# Patient Record
Sex: Male | Born: 1955 | Race: White | Hispanic: No | Smoking: Former smoker
Health system: Southern US, Community
[De-identification: ages and names within clinical notes are randomized; demographics above are authoritative.]

## PROBLEM LIST (undated history)

## (undated) ENCOUNTER — Emergency Department (HOSPITAL_COMMUNITY): Payer: PRIVATE HEALTH INSURANCE | Source: Home / Self Care

## (undated) DIAGNOSIS — Z9289 Personal history of other medical treatment: Secondary | ICD-10-CM

## (undated) DIAGNOSIS — E669 Obesity, unspecified: Secondary | ICD-10-CM

## (undated) DIAGNOSIS — R569 Unspecified convulsions: Secondary | ICD-10-CM

## (undated) DIAGNOSIS — G4700 Insomnia, unspecified: Secondary | ICD-10-CM

## (undated) DIAGNOSIS — G8220 Paraplegia, unspecified: Secondary | ICD-10-CM

## (undated) DIAGNOSIS — Z978 Presence of other specified devices: Secondary | ICD-10-CM

## (undated) DIAGNOSIS — E785 Hyperlipidemia, unspecified: Secondary | ICD-10-CM

## (undated) DIAGNOSIS — K5900 Constipation, unspecified: Secondary | ICD-10-CM

## (undated) DIAGNOSIS — E538 Deficiency of other specified B group vitamins: Secondary | ICD-10-CM

## (undated) DIAGNOSIS — N319 Neuromuscular dysfunction of bladder, unspecified: Secondary | ICD-10-CM

## (undated) DIAGNOSIS — D649 Anemia, unspecified: Secondary | ICD-10-CM

## (undated) DIAGNOSIS — E2740 Unspecified adrenocortical insufficiency: Secondary | ICD-10-CM

## (undated) DIAGNOSIS — I10 Essential (primary) hypertension: Secondary | ICD-10-CM

## (undated) DIAGNOSIS — L89159 Pressure ulcer of sacral region, unspecified stage: Secondary | ICD-10-CM

## (undated) DIAGNOSIS — F319 Bipolar disorder, unspecified: Secondary | ICD-10-CM

## (undated) DIAGNOSIS — F1010 Alcohol abuse, uncomplicated: Secondary | ICD-10-CM

## (undated) DIAGNOSIS — G546 Phantom limb syndrome with pain: Secondary | ICD-10-CM

## (undated) DIAGNOSIS — F1910 Other psychoactive substance abuse, uncomplicated: Secondary | ICD-10-CM

## (undated) DIAGNOSIS — C689 Malignant neoplasm of urinary organ, unspecified: Secondary | ICD-10-CM

## (undated) DIAGNOSIS — I2699 Other pulmonary embolism without acute cor pulmonale: Secondary | ICD-10-CM

## (undated) DIAGNOSIS — N289 Disorder of kidney and ureter, unspecified: Secondary | ICD-10-CM

## (undated) DIAGNOSIS — S78112A Complete traumatic amputation at level between left hip and knee, initial encounter: Secondary | ICD-10-CM

## (undated) DIAGNOSIS — K219 Gastro-esophageal reflux disease without esophagitis: Secondary | ICD-10-CM

## (undated) DIAGNOSIS — A4102 Sepsis due to Methicillin resistant Staphylococcus aureus: Secondary | ICD-10-CM

## (undated) DIAGNOSIS — Z960 Presence of urogenital implants: Secondary | ICD-10-CM

## (undated) DIAGNOSIS — B1920 Unspecified viral hepatitis C without hepatic coma: Secondary | ICD-10-CM

## (undated) DIAGNOSIS — G8929 Other chronic pain: Secondary | ICD-10-CM

## (undated) DIAGNOSIS — J189 Pneumonia, unspecified organism: Secondary | ICD-10-CM

## (undated) HISTORY — DX: Alcohol abuse, uncomplicated: F10.10

## (undated) HISTORY — DX: Anemia, unspecified: D64.9

## (undated) HISTORY — DX: Gastro-esophageal reflux disease without esophagitis: K21.9

## (undated) HISTORY — DX: Unspecified convulsions: R56.9

## (undated) HISTORY — DX: Constipation, unspecified: K59.00

## (undated) HISTORY — DX: Insomnia, unspecified: G47.00

## (undated) HISTORY — PX: APPENDECTOMY: SHX54

## (undated) HISTORY — DX: Hyperlipidemia, unspecified: E78.5

## (undated) HISTORY — PX: ORIF HUMERAL CONDYLE FRACTURE: SUR935

## (undated) HISTORY — DX: Essential (primary) hypertension: I10

## (undated) HISTORY — DX: Obesity, unspecified: E66.9

## (undated) HISTORY — PX: OTHER SURGICAL HISTORY: SHX169

## (undated) HISTORY — DX: Deficiency of other specified B group vitamins: E53.8

## (undated) HISTORY — DX: Paraplegia, unspecified: G82.20

## (undated) HISTORY — PX: ABOVE KNEE LEG AMPUTATION: SUR20

## (undated) HISTORY — DX: Phantom limb syndrome with pain: G54.6

## (undated) HISTORY — PX: COLON SURGERY: SHX602

## (undated) HISTORY — DX: Bipolar disorder, unspecified: F31.9

## (undated) HISTORY — DX: Neuromuscular dysfunction of bladder, unspecified: N31.9

## (undated) HISTORY — DX: Other chronic pain: G89.29

## (undated) HISTORY — PX: CHOLECYSTECTOMY: SHX55

---

## 2012-11-23 ENCOUNTER — Non-Acute Institutional Stay (SKILLED_NURSING_FACILITY): Payer: Medicare Other | Admitting: Internal Medicine

## 2012-11-23 ENCOUNTER — Other Ambulatory Visit: Payer: Self-pay | Admitting: *Deleted

## 2012-11-23 DIAGNOSIS — G8220 Paraplegia, unspecified: Secondary | ICD-10-CM

## 2012-11-23 DIAGNOSIS — IMO0002 Reserved for concepts with insufficient information to code with codable children: Secondary | ICD-10-CM

## 2012-11-23 DIAGNOSIS — I10 Essential (primary) hypertension: Secondary | ICD-10-CM

## 2012-11-23 DIAGNOSIS — L8994 Pressure ulcer of unspecified site, stage 4: Secondary | ICD-10-CM

## 2012-11-23 DIAGNOSIS — L8990 Pressure ulcer of unspecified site, unspecified stage: Secondary | ICD-10-CM

## 2012-11-23 DIAGNOSIS — E785 Hyperlipidemia, unspecified: Secondary | ICD-10-CM

## 2012-11-23 MED ORDER — CLONAZEPAM 0.5 MG PO TABS: ORAL_TABLET | ORAL | Status: DC

## 2012-11-23 MED ORDER — FENTANYL 75 MCG/HR TD PT72: MEDICATED_PATCH | TRANSDERMAL | Status: DC

## 2012-11-23 MED ORDER — CLONAZEPAM 1 MG PO TABS: ORAL_TABLET | ORAL | Status: DC

## 2012-11-23 MED ORDER — PREGABALIN 300 MG PO CAPS: ORAL_CAPSULE | ORAL | Status: DC
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
## 2012-11-30 ENCOUNTER — Other Ambulatory Visit: Payer: Self-pay | Admitting: Family Medicine

## 2012-11-30 DIAGNOSIS — M25561 Pain in right knee: Secondary | ICD-10-CM

## 2012-12-03 ENCOUNTER — Non-Acute Institutional Stay (SKILLED_NURSING_FACILITY): Payer: Medicare Other | Admitting: Adult Health

## 2012-12-03 ENCOUNTER — Encounter: Payer: Self-pay | Admitting: Adult Health

## 2012-12-03 DIAGNOSIS — E876 Hypokalemia: Secondary | ICD-10-CM | POA: Insufficient documentation

## 2012-12-03 DIAGNOSIS — G8929 Other chronic pain: Secondary | ICD-10-CM

## 2012-12-03 DIAGNOSIS — F319 Bipolar disorder, unspecified: Secondary | ICD-10-CM

## 2012-12-03 DIAGNOSIS — G546 Phantom limb syndrome with pain: Secondary | ICD-10-CM

## 2012-12-03 DIAGNOSIS — G547 Phantom limb syndrome without pain: Secondary | ICD-10-CM

## 2012-12-03 DIAGNOSIS — B1920 Unspecified viral hepatitis C without hepatic coma: Secondary | ICD-10-CM | POA: Insufficient documentation

## 2012-12-03 DIAGNOSIS — G4700 Insomnia, unspecified: Secondary | ICD-10-CM | POA: Insufficient documentation

## 2012-12-03 DIAGNOSIS — E785 Hyperlipidemia, unspecified: Secondary | ICD-10-CM | POA: Insufficient documentation

## 2012-12-03 MED ORDER — DULOXETINE HCL 30 MG PO CPEP: 30.0000 mg | ORAL_CAPSULE | Freq: Every day | ORAL | Status: DC
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
## 2012-12-06 ENCOUNTER — Ambulatory Visit (HOSPITAL_COMMUNITY): Payer: Medicare Other

## 2012-12-07 ENCOUNTER — Non-Acute Institutional Stay (SKILLED_NURSING_FACILITY): Payer: Medicare Other | Admitting: Internal Medicine

## 2012-12-07 DIAGNOSIS — L8993 Pressure ulcer of unspecified site, stage 3: Secondary | ICD-10-CM

## 2012-12-07 DIAGNOSIS — N390 Urinary tract infection, site not specified: Secondary | ICD-10-CM

## 2012-12-07 DIAGNOSIS — L8990 Pressure ulcer of unspecified site, unspecified stage: Secondary | ICD-10-CM

## 2012-12-07 NOTE — Progress Notes (Signed)
Patient ID: Samuel [name], male   DOB: 02-04-1956, 57 y.o.   MRN: [id number] Chief complain; fever, review  History; this patient came from a nursing home in [place]. I admitted him to the visit 2-3 weeks ago. He is a chronic L1 paraplegia. He came here with a pressure ulcer stage III, under his coccyx area. He is obviously had multiple surgeries in this area before and has a chronic Foley catheter, as well as a chronic colostomy.  We have been using collagen silver collagen with an occlusive dressing on this area, but it had minimal improvement for 2 weeks now. Other issues include instability of the right knee for which she has seen orthopedic and/or MRI of the knee was ordered. Finally, I was called about a low grade fever 2 days ago, which seems to have abated on its own. A urine culture is pending, although the urinalysis showed many bacteria, etc. the patient states that he is a chronic very resistant organism in his urine which is sounds as though it is colonizing his chronic Foley catheter. He states, "I don't full with it and it doesn't full with me".  Physical exam;  Skin; this is a clean granulating wound bed. Currently stage III. Dimensions are unchanged however, he continues to want to see plastic surgery, but tells me he does not want a wound VAC. I don't believe that he would be compliant with the pressure relief restriction even if I could get a plastic surgeon to agree to attempt to close this. I would like to try a wound VAC first. Respiratory clear entry bilaterally. Cardiac heart sounds are normal. Abdomen obese. No masses or tenderness.  Impression/plan #1 stage III decubitus ulcer. I am not going to change his current treatment nor do I feel pressed to arrange a plastic surgery consult. Will re\re approached the idea of a wound VAC next week.  #2 mild fever, I am doubtful. He has a UTI. I don't think antibiotics are currently indicated. #3 right knee instability. Await MRI  and final orthopedic opinion. He states this occurred 6 weeks ago after a fall

## 2012-12-10 ENCOUNTER — Ambulatory Visit (HOSPITAL_COMMUNITY)
Admission: RE | Admit: 2012-12-10 | Discharge: 2012-12-10 | Disposition: A | Discharge status: Home / Self Care | Payer: Medicare Other | Source: Ambulatory Visit | Attending: Family Medicine | Admitting: Family Medicine

## 2012-12-10 ENCOUNTER — Non-Acute Institutional Stay (SKILLED_NURSING_FACILITY): Payer: Medicare Other | Admitting: Adult Health

## 2012-12-10 DIAGNOSIS — N390 Urinary tract infection, site not specified: Secondary | ICD-10-CM

## 2012-12-10 DIAGNOSIS — M25561 Pain in right knee: Secondary | ICD-10-CM

## 2012-12-23 ENCOUNTER — Other Ambulatory Visit: Payer: Self-pay | Admitting: Geriatric Medicine

## 2012-12-23 MED ORDER — OXYCODONE HCL 15 MG PO TABS: 15.0000 mg | ORAL_TABLET | Freq: Three times a day (TID) | ORAL | Status: DC | PRN

## 2012-12-28 ENCOUNTER — Non-Acute Institutional Stay (SKILLED_NURSING_FACILITY): Payer: Medicare Other | Admitting: Internal Medicine

## 2012-12-28 DIAGNOSIS — L8993 Pressure ulcer of unspecified site, stage 3: Secondary | ICD-10-CM

## 2012-12-28 DIAGNOSIS — L8990 Pressure ulcer of unspecified site, unspecified stage: Secondary | ICD-10-CM

## 2012-12-31 ENCOUNTER — Ambulatory Visit (HOSPITAL_COMMUNITY)
Admission: RE | Admit: 2012-12-31 | Discharge: 2012-12-31 | Disposition: A | Discharge status: Home / Self Care | Payer: Medicare Other | Source: Ambulatory Visit | Attending: Family Medicine | Admitting: Family Medicine

## 2012-12-31 DIAGNOSIS — M1710 Unilateral primary osteoarthritis, unspecified knee: Secondary | ICD-10-CM | POA: Insufficient documentation

## 2012-12-31 DIAGNOSIS — IMO0002 Reserved for concepts with insufficient information to code with codable children: Secondary | ICD-10-CM | POA: Insufficient documentation

## 2012-12-31 DIAGNOSIS — X58XXXA Exposure to other specified factors, initial encounter: Secondary | ICD-10-CM | POA: Insufficient documentation

## 2012-12-31 MED ORDER — DIAZEPAM 5 MG PO TABS
5.0000 mg | ORAL_TABLET | Freq: Once | ORAL | Status: AC
  Administered 2012-12-31: 5 mg via ORAL

## 2012-12-31 MED ORDER — DIAZEPAM 5 MG PO TABS
ORAL_TABLET | ORAL | Status: AC
  Filled 2012-12-31: qty 1

## 2013-01-03 ENCOUNTER — Other Ambulatory Visit: Payer: Self-pay | Admitting: *Deleted

## 2013-01-03 MED ORDER — FENTANYL 75 MCG/HR TD PT72: MEDICATED_PATCH | TRANSDERMAL | Status: DC
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
## 2013-01-10 ENCOUNTER — Encounter (HOSPITAL_BASED_OUTPATIENT_CLINIC_OR_DEPARTMENT_OTHER): Payer: Medicare Other | Attending: Plastic Surgery

## 2013-01-10 DIAGNOSIS — E785 Hyperlipidemia, unspecified: Secondary | ICD-10-CM | POA: Insufficient documentation

## 2013-01-10 DIAGNOSIS — Z79899 Other long term (current) drug therapy: Secondary | ICD-10-CM | POA: Insufficient documentation

## 2013-01-10 DIAGNOSIS — L98499 Non-pressure chronic ulcer of skin of other sites with unspecified severity: Secondary | ICD-10-CM | POA: Insufficient documentation

## 2013-01-10 DIAGNOSIS — I10 Essential (primary) hypertension: Secondary | ICD-10-CM | POA: Insufficient documentation

## 2013-01-10 DIAGNOSIS — G8220 Paraplegia, unspecified: Secondary | ICD-10-CM | POA: Insufficient documentation

## 2013-01-10 DIAGNOSIS — N319 Neuromuscular dysfunction of bladder, unspecified: Secondary | ICD-10-CM | POA: Insufficient documentation

## 2013-01-10 NOTE — Progress Notes (Signed)
[facility]  NAME:  Samuel [name], Samuel [name]            ACCOUNT NO.:  [id number]  MEDICAL RECORD NO.:  [id number]      DATE OF BIRTH:  Apr 12, 1956  PHYSICIAN:  Wayland Denis, DO       VISIT DATE:  01/10/2013                                  OFFICE VISIT   HISTORY OF PRESENT ILLNESS:  The patient is a 57 year old gentleman who is here for evaluation of a low sacral ulcer.  He has chronic L1 paraplegia and is in the [facility].  He does not have an air mattress bed and based on the conversation today does not want one but we will look into that.  He has stage III ulcer of the coccyx area. He is quite active and is on this area very often.  He has had ulcers in the past for which he looks like he has had a V-Y advancement bilateral on the sacral area but superior to this.  The coccyx area is clean.  The area is granulating.  There does not appear to be any fibrous tissue or necrotic tissue.  It is 6.9 x 1.7 x 2.3.  He is using silver collagen on the area at present.  He states that he does sleep on his side and tries to stay off it.  He is requesting a flap.  PAST SURGICAL HISTORY:  Positive for paraplegia, back surgery, left knee surgery, and left AKA.  PAST MEDICAL HISTORY: 1. Hypertension. 2. Neurogenic bladder. 3. Bipolar mood disorder. 4. Hyperlipidemia. 5. Paralysis.  MEDICATIONS:  Baclofen, Klonopin, vitamin B12, vitamin B9, Micro-K, Pro- Stat, Duragesic patch, Lamictal, Lyrica, Klonopin, clonidine, __________ melatonin, Pravachol, OxyContin.  ALLERGIES:  No known drug allergies.  SOCIAL HISTORY:  Lives at [place], currently a smoker.  FAMILY HISTORY:  Not contributory.  REVIEW OF SYSTEMS:  Otherwise, he has concerns for __________  PHYSICAL EXAMINATION:  GENERAL:  He is alert, oriented, and cooperative. He seems to be a good historian but very particular about what he will and will not do. NECK:  No cervical  lymphadenopathy. RESPIRATIONS:  His breathing is unlabored. HEART:  Regular. ABDOMEN:  Large.  He has ostomy.  No tenderness.  The wound was examined as noted above.  No sign of infection. SKIN:  Otherwise intact.  RECOMMEND:  Continue with silver collagen, offload air mattress bed. Check prealbumin, multivitamin, vitamin C, zinc, and increase protein, and we will refer him for a flap or at least a consultation.     Wayland Denis, DO     CS/MEDQ  D:  01/10/2013  T:  01/10/2013  Job:  [id number]

## 2013-01-12 ENCOUNTER — Other Ambulatory Visit (HOSPITAL_COMMUNITY): Payer: Self-pay | Admitting: Orthopedic Surgery

## 2013-01-27 ENCOUNTER — Encounter (HOSPITAL_COMMUNITY): Payer: Self-pay | Admitting: *Deleted
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
## 2013-01-31 MED ORDER — CHLORHEXIDINE GLUCONATE 4 % EX LIQD: 60.0000 mL | Freq: Once | CUTANEOUS | Status: DC

## 2013-01-31 MED ORDER — CEFAZOLIN SODIUM-DEXTROSE 2-3 GM-% IV SOLR
2.0000 g | INTRAVENOUS | Status: AC
  Administered 2013-02-01: 2 g via INTRAVENOUS
  Filled 2013-01-31: qty 50

## 2013-02-01 ENCOUNTER — Inpatient Hospital Stay (HOSPITAL_COMMUNITY): Payer: Medicare Other

## 2013-02-01 ENCOUNTER — Encounter (HOSPITAL_COMMUNITY)
Admission: RE | Disposition: A | Discharge status: Skilled Nursing Facility | Payer: Self-pay | Source: Ambulatory Visit | Attending: Orthopedic Surgery

## 2013-02-01 ENCOUNTER — Inpatient Hospital Stay (HOSPITAL_COMMUNITY)
Admission: RE | Admit: 2013-02-01 | Discharge: 2013-02-04 | DRG: 480 | Disposition: A | Discharge status: Skilled Nursing Facility | Payer: Medicare Other | Source: Ambulatory Visit | Attending: Orthopedic Surgery | Admitting: Orthopedic Surgery

## 2013-02-01 ENCOUNTER — Encounter (HOSPITAL_COMMUNITY): Payer: Self-pay | Admitting: *Deleted

## 2013-02-01 ENCOUNTER — Inpatient Hospital Stay (HOSPITAL_COMMUNITY): Payer: Medicare Other | Admitting: Certified Registered"

## 2013-02-01 ENCOUNTER — Encounter (HOSPITAL_COMMUNITY): Payer: Self-pay | Admitting: Certified Registered"

## 2013-02-01 DIAGNOSIS — G8220 Paraplegia, unspecified: Secondary | ICD-10-CM | POA: Diagnosis present

## 2013-02-01 DIAGNOSIS — L8990 Pressure ulcer of unspecified site, unspecified stage: Secondary | ICD-10-CM | POA: Diagnosis present

## 2013-02-01 DIAGNOSIS — Z6838 Body mass index (BMI) 38.0-38.9, adult: Secondary | ICD-10-CM

## 2013-02-01 DIAGNOSIS — IMO0002 Reserved for concepts with insufficient information to code with codable children: Principal | ICD-10-CM | POA: Diagnosis present

## 2013-02-01 DIAGNOSIS — G547 Phantom limb syndrome without pain: Secondary | ICD-10-CM | POA: Diagnosis present

## 2013-02-01 DIAGNOSIS — I10 Essential (primary) hypertension: Secondary | ICD-10-CM | POA: Diagnosis present

## 2013-02-01 DIAGNOSIS — E785 Hyperlipidemia, unspecified: Secondary | ICD-10-CM | POA: Diagnosis present

## 2013-02-01 DIAGNOSIS — K219 Gastro-esophageal reflux disease without esophagitis: Secondary | ICD-10-CM | POA: Diagnosis present

## 2013-02-01 DIAGNOSIS — N319 Neuromuscular dysfunction of bladder, unspecified: Secondary | ICD-10-CM | POA: Diagnosis present

## 2013-02-01 DIAGNOSIS — G8929 Other chronic pain: Secondary | ICD-10-CM | POA: Diagnosis present

## 2013-02-01 DIAGNOSIS — E538 Deficiency of other specified B group vitamins: Secondary | ICD-10-CM | POA: Diagnosis present

## 2013-02-01 DIAGNOSIS — E669 Obesity, unspecified: Secondary | ICD-10-CM | POA: Diagnosis present

## 2013-02-01 DIAGNOSIS — D649 Anemia, unspecified: Secondary | ICD-10-CM | POA: Diagnosis present

## 2013-02-01 DIAGNOSIS — L8993 Pressure ulcer of unspecified site, stage 3: Secondary | ICD-10-CM | POA: Diagnosis present

## 2013-02-01 DIAGNOSIS — G4700 Insomnia, unspecified: Secondary | ICD-10-CM | POA: Diagnosis present

## 2013-02-01 DIAGNOSIS — F319 Bipolar disorder, unspecified: Secondary | ICD-10-CM | POA: Diagnosis present

## 2013-02-01 HISTORY — PX: ORIF TIBIA PLATEAU: SHX2132

## 2013-02-01 LAB — COMPREHENSIVE METABOLIC PANEL
ALT: 15 U/L (ref 0–53)
AST: 20 U/L (ref 0–37)
Alkaline Phosphatase: 96 U/L (ref 39–117)
CO2: 25 mEq/L (ref 19–32)
Chloride: 103 mEq/L (ref 96–112)
GFR calc Af Amer: >90 mL/min (ref >90)
GFR calc non Af Amer: >90 mL/min (ref >90)
Glucose, Bld: 108 mg/dL — ABNORMAL HIGH (ref 70–99)
Potassium: 3.8 mEq/L (ref 3.5–5.1)
Sodium: 137 mEq/L (ref 135–145)

## 2013-02-01 LAB — CBC
Hemoglobin: 12.8 g/dL — ABNORMAL LOW (ref 13.0–17.0)
MCH: 25.5 pg — ABNORMAL LOW (ref 26.0–34.0)
MCHC: 32 g/dL (ref 30.0–36.0)
RDW: 16.2 % — ABNORMAL HIGH (ref 11.5–15.5)

## 2013-02-01 SURGERY — OPEN REDUCTION INTERNAL FIXATION (ORIF) TIBIAL PLATEAU
Anesthesia: General | Site: Leg Upper | Laterality: Right | Wound class: Clean

## 2013-02-01 MED ORDER — WARFARIN SODIUM 7.5 MG PO TABS: 7.5000 mg | ORAL_TABLET | Freq: Once | ORAL | Status: DC

## 2013-02-01 MED ORDER — NEOSTIGMINE METHYLSULFATE 1 MG/ML IJ SOLN
INTRAMUSCULAR | Status: DC | PRN
  Administered 2013-02-01: 4 mg via INTRAVENOUS

## 2013-02-01 MED ORDER — FOLIC ACID 1 MG PO TABS
1.0000 mg | ORAL_TABLET | Freq: Every day | ORAL | Status: DC
  Administered 2013-02-02 – 2013-02-04 (×3): 1 mg via ORAL
  Filled 2013-02-01 (×3): qty 1

## 2013-02-01 MED ORDER — VITAMIN B-12 1000 MCG PO TABS
1000.0000 ug | ORAL_TABLET | Freq: Every day | ORAL | Status: DC
  Administered 2013-02-02 – 2013-02-04 (×3): 1000 ug via ORAL
  Filled 2013-02-01 (×3): qty 1

## 2013-02-01 MED ORDER — 0.9 % SODIUM CHLORIDE (POUR BTL) OPTIME
TOPICAL | Status: DC | PRN
  Administered 2013-02-01: 3000 mL

## 2013-02-01 MED ORDER — POTASSIUM CHLORIDE IN NACL 20-0.9 MEQ/L-% IV SOLN
INTRAVENOUS | Status: DC
  Administered 2013-02-01: 18:00:00 via INTRAVENOUS
  Filled 2013-02-01 (×2): qty 1000

## 2013-02-01 MED ORDER — ONDANSETRON HCL 4 MG/2ML IJ SOLN: 4.0000 mg | Freq: Once | INTRAMUSCULAR | Status: DC | PRN

## 2013-02-01 MED ORDER — LABETALOL HCL 5 MG/ML IV SOLN
INTRAVENOUS | Status: DC | PRN
  Administered 2013-02-01: 2.5 mg via INTRAVENOUS
  Administered 2013-02-01: 5 mg via INTRAVENOUS

## 2013-02-01 MED ORDER — HYDROMORPHONE HCL PF 1 MG/ML IJ SOLN: 0.2500 mg | INTRAMUSCULAR | Status: DC | PRN

## 2013-02-01 MED ORDER — BACLOFEN 10 MG PO TABS
10.0000 mg | ORAL_TABLET | Freq: Four times a day (QID) | ORAL | Status: DC | PRN
  Administered 2013-02-01: 10 mg via ORAL
  Filled 2013-02-01 (×2): qty 1

## 2013-02-01 MED ORDER — ONDANSETRON HCL 4 MG PO TABS: 4.0000 mg | ORAL_TABLET | Freq: Four times a day (QID) | ORAL | Status: DC | PRN

## 2013-02-01 MED ORDER — COUMADIN BOOK
Freq: Once | Status: DC
  Filled 2013-02-01 (×2): qty 1

## 2013-02-01 MED ORDER — FENTANYL 25 MCG/HR TD PT72
75.0000 ug | MEDICATED_PATCH | TRANSDERMAL | Status: DC
  Administered 2013-02-01: 75 ug via TRANSDERMAL
  Filled 2013-02-01: qty 1

## 2013-02-01 MED ORDER — VITAMIN C 500 MG PO TABS
500.0000 mg | ORAL_TABLET | Freq: Two times a day (BID) | ORAL | Status: DC
  Administered 2013-02-01 – 2013-02-04 (×6): 500 mg via ORAL
  Filled 2013-02-01 (×7): qty 1

## 2013-02-01 MED ORDER — ONDANSETRON HCL 4 MG/2ML IJ SOLN: 4.0000 mg | Freq: Four times a day (QID) | INTRAMUSCULAR | Status: DC | PRN

## 2013-02-01 MED ORDER — LACTULOSE 10 GM/15ML PO SOLN
20.0000 g | Freq: Four times a day (QID) | ORAL | Status: DC
  Administered 2013-02-01 – 2013-02-04 (×4): 20 g via ORAL
  Filled 2013-02-01 (×14): qty 30

## 2013-02-01 MED ORDER — ACETAMINOPHEN 10 MG/ML IV SOLN: 1000.0000 mg | Freq: Once | INTRAVENOUS | Status: DC | PRN

## 2013-02-01 MED ORDER — CEFAZOLIN SODIUM-DEXTROSE 2-3 GM-% IV SOLR
2.0000 g | Freq: Three times a day (TID) | INTRAVENOUS | Status: AC
  Administered 2013-02-01 – 2013-02-02 (×3): 2 g via INTRAVENOUS
  Filled 2013-02-01 (×3): qty 50

## 2013-02-01 MED ORDER — WARFARIN - PHARMACIST DOSING INPATIENT
Freq: Every day | Status: DC
  Administered 2013-02-02: 17:00:00

## 2013-02-01 MED ORDER — WARFARIN SODIUM 7.5 MG PO TABS
7.5000 mg | ORAL_TABLET | Freq: Once | ORAL | Status: AC
  Administered 2013-02-01: 7.5 mg via ORAL
  Filled 2013-02-01: qty 1

## 2013-02-01 MED ORDER — PROPOFOL 10 MG/ML IV BOLUS
INTRAVENOUS | Status: DC | PRN
  Administered 2013-02-01: 200 mg via INTRAVENOUS

## 2013-02-01 MED ORDER — CLONAZEPAM 0.5 MG PO TABS: 0.5000 mg | ORAL_TABLET | Freq: Three times a day (TID) | ORAL | Status: DC | PRN

## 2013-02-01 MED ORDER — NALOXONE HCL 0.4 MG/ML IJ SOLN: 0.4000 mg | INTRAMUSCULAR | Status: DC | PRN

## 2013-02-01 MED ORDER — DIPHENHYDRAMINE HCL 12.5 MG/5ML PO ELIX: 12.5000 mg | ORAL_SOLUTION | Freq: Four times a day (QID) | ORAL | Status: DC | PRN

## 2013-02-01 MED ORDER — DIPHENHYDRAMINE HCL 50 MG/ML IJ SOLN: 12.5000 mg | Freq: Four times a day (QID) | INTRAMUSCULAR | Status: DC | PRN

## 2013-02-01 MED ORDER — METOCLOPRAMIDE HCL 5 MG/ML IJ SOLN: 5.0000 mg | Freq: Three times a day (TID) | INTRAMUSCULAR | Status: DC | PRN

## 2013-02-01 MED ORDER — ONDANSETRON HCL 4 MG/2ML IJ SOLN
INTRAMUSCULAR | Status: DC | PRN
  Administered 2013-02-01: 4 mg via INTRAVENOUS

## 2013-02-01 MED ORDER — OXYCODONE HCL 5 MG PO TABS
15.0000 mg | ORAL_TABLET | Freq: Four times a day (QID) | ORAL | Status: DC | PRN
  Administered 2013-02-02 (×2): 15 mg via ORAL
  Filled 2013-02-01 (×2): qty 3

## 2013-02-01 MED ORDER — ZINC SULFATE 220 (50 ZN) MG PO CAPS
220.0000 mg | ORAL_CAPSULE | Freq: Every day | ORAL | Status: DC
  Administered 2013-02-01 – 2013-02-04 (×4): 220 mg via ORAL
  Filled 2013-02-01 (×4): qty 1

## 2013-02-01 MED ORDER — SODIUM CHLORIDE 0.9 % IJ SOLN: 9.0000 mL | INTRAMUSCULAR | Status: DC | PRN

## 2013-02-01 MED ORDER — LIDOCAINE HCL (CARDIAC) 20 MG/ML IV SOLN
INTRAVENOUS | Status: DC | PRN
  Administered 2013-02-01: 60 mg via INTRAVENOUS

## 2013-02-01 MED ORDER — WARFARIN VIDEO: Freq: Once | Status: DC

## 2013-02-01 MED ORDER — HYDROMORPHONE HCL PF 1 MG/ML IJ SOLN
INTRAMUSCULAR | Status: AC
  Filled 2013-02-01: qty 2

## 2013-02-01 MED ORDER — NITROGLYCERIN 0.4 MG SL SUBL: 0.4000 mg | SUBLINGUAL_TABLET | SUBLINGUAL | Status: DC | PRN

## 2013-02-01 MED ORDER — CLONAZEPAM 1 MG PO TABS
1.0000 mg | ORAL_TABLET | Freq: Three times a day (TID) | ORAL | Status: DC | PRN
  Administered 2013-02-03: 1 mg via ORAL
  Filled 2013-02-01: qty 1

## 2013-02-01 MED ORDER — CLONIDINE HCL 0.1 MG PO TABS
0.1000 mg | ORAL_TABLET | Freq: Four times a day (QID) | ORAL | Status: DC | PRN
  Filled 2013-02-01: qty 1

## 2013-02-01 MED ORDER — MIDAZOLAM HCL 5 MG/5ML IJ SOLN
INTRAMUSCULAR | Status: DC | PRN
  Administered 2013-02-01: 2 mg via INTRAVENOUS

## 2013-02-01 MED ORDER — SIMVASTATIN 10 MG PO TABS
30.0000 mg | ORAL_TABLET | Freq: Every day | ORAL | Status: DC
  Administered 2013-02-01 – 2013-02-03 (×3): 30 mg via ORAL
  Filled 2013-02-01 (×4): qty 3

## 2013-02-01 MED ORDER — PREGABALIN 50 MG PO CAPS
300.0000 mg | ORAL_CAPSULE | Freq: Every day | ORAL | Status: DC
  Administered 2013-02-02 – 2013-02-04 (×3): 300 mg via ORAL
  Filled 2013-02-01 (×3): qty 6

## 2013-02-01 MED ORDER — ARTIFICIAL TEARS OP OINT
TOPICAL_OINTMENT | OPHTHALMIC | Status: DC | PRN
  Administered 2013-02-01: 1 via OPHTHALMIC

## 2013-02-01 MED ORDER — MORPHINE SULFATE (PF) 1 MG/ML IV SOLN
INTRAVENOUS | Status: DC
  Administered 2013-02-01: 3 mg via INTRAVENOUS
  Administered 2013-02-02: 12 mg via INTRAVENOUS
  Administered 2013-02-02: 2.99 mg via INTRAVENOUS

## 2013-02-01 MED ORDER — DULOXETINE HCL 60 MG PO CPEP
60.0000 mg | ORAL_CAPSULE | Freq: Every day | ORAL | Status: DC
  Administered 2013-02-02 – 2013-02-04 (×3): 60 mg via ORAL
  Filled 2013-02-01 (×3): qty 1

## 2013-02-01 MED ORDER — LAMOTRIGINE 200 MG PO TABS
200.0000 mg | ORAL_TABLET | Freq: Two times a day (BID) | ORAL | Status: DC
  Administered 2013-02-01 – 2013-02-04 (×6): 200 mg via ORAL
  Filled 2013-02-01 (×7): qty 1

## 2013-02-01 MED ORDER — GLYCOPYRROLATE 0.2 MG/ML IJ SOLN
INTRAMUSCULAR | Status: DC | PRN
  Administered 2013-02-01: 0.6 mg via INTRAVENOUS

## 2013-02-01 MED ORDER — METOCLOPRAMIDE HCL 10 MG PO TABS
5.0000 mg | ORAL_TABLET | Freq: Three times a day (TID) | ORAL | Status: DC | PRN
  Administered 2013-02-04: 10 mg via ORAL
  Filled 2013-02-01: qty 1

## 2013-02-01 MED ORDER — LACTATED RINGERS IV SOLN
INTRAVENOUS | Status: DC | PRN
  Administered 2013-02-01 (×3): via INTRAVENOUS

## 2013-02-01 MED ORDER — ROCURONIUM BROMIDE 100 MG/10ML IV SOLN
INTRAVENOUS | Status: DC | PRN
  Administered 2013-02-01: 30 mg via INTRAVENOUS
  Administered 2013-02-01: 10 mg via INTRAVENOUS
  Administered 2013-02-01: 50 mg via INTRAVENOUS

## 2013-02-01 MED ORDER — MORPHINE SULFATE (PF) 1 MG/ML IV SOLN
INTRAVENOUS | Status: AC
  Administered 2013-02-01: 16:00:00
  Filled 2013-02-01: qty 25

## 2013-02-01 MED ORDER — FENTANYL CITRATE 0.05 MG/ML IJ SOLN
INTRAMUSCULAR | Status: DC | PRN
  Administered 2013-02-01 (×4): 50 ug via INTRAVENOUS
  Administered 2013-02-01: 100 ug via INTRAVENOUS

## 2013-02-01 SURGICAL SUPPLY — 75 items
3.8 DRILL BIT ×2 IMPLANT
5.0mm cannulated screw 65mm (Screw) ×2 IMPLANT
6.5mm screw 60mm (Screw) ×2 IMPLANT
6.5mm screw x 65mm (Screw) ×4 IMPLANT
BANDAGE ELASTIC 4 VELCRO ST LF (GAUZE/BANDAGES/DRESSINGS) IMPLANT
BANDAGE ELASTIC 6 VELCRO ST LF (GAUZE/BANDAGES/DRESSINGS) IMPLANT
BANDAGE GAUZE ELAST BULKY 4 IN (GAUZE/BANDAGES/DRESSINGS) IMPLANT
BIT DRILL 3.2 CALIBRATED (BIT) ×2
BIT DRILL 3.2MM CALIBRATED (BIT) ×2 IMPLANT
BIT DRILL 3.8 CANN DISP (BIT) ×2 IMPLANT
BLADE SURG 10 STRL SS (BLADE) ×6 IMPLANT
BLADE SURG 15 STRL LF DISP TIS (BLADE) ×1 IMPLANT
BLADE SURG 15 STRL SS (BLADE) ×1
BLADE SURG ROTATE 9660 (MISCELLANEOUS) IMPLANT
BONE CHIP PRESERV 20CC (Bone Implant) ×2 IMPLANT
CLEANER TIP ELECTROSURG 2X2 (MISCELLANEOUS) ×2 IMPLANT
CLOTH BEACON ORANGE TIMEOUT ST (SAFETY) ×2 IMPLANT
COVER MAYO STAND STRL (DRAPES) ×2 IMPLANT
COVER SURGICAL LIGHT HANDLE (MISCELLANEOUS) ×2 IMPLANT
CUFF TOURNIQUET SINGLE 34IN LL (TOURNIQUET CUFF) IMPLANT
CUFF TOURNIQUET SINGLE 44IN (TOURNIQUET CUFF) ×2 IMPLANT
DRAPE C-ARM 42X72 X-RAY (DRAPES) ×2 IMPLANT
DRAPE C-ARMOR (DRAPES) ×2 IMPLANT
DRAPE INCISE IOBAN 66X45 STRL (DRAPES) ×4 IMPLANT
DRAPE U-SHAPE 47X51 STRL (DRAPES) ×2 IMPLANT
DRILL BIT 3.2MM CALIBRATED (BIT) ×2
DRSG ADAPTIC 3X8 NADH LF (GAUZE/BANDAGES/DRESSINGS) IMPLANT
DRSG MEPILEX BORDER 4X12 (GAUZE/BANDAGES/DRESSINGS) ×4 IMPLANT
DRSG PAD ABDOMINAL 8X10 ST (GAUZE/BANDAGES/DRESSINGS) IMPLANT
DURAPREP 26ML APPLICATOR (WOUND CARE) ×2 IMPLANT
ELECT REM PT RETURN 9FT ADLT (ELECTROSURGICAL) ×2
ELECTRODE REM PT RTRN 9FT ADLT (ELECTROSURGICAL) ×1 IMPLANT
EVACUATOR 1/8 PVC DRAIN (DRAIN) IMPLANT
GAUZE XEROFORM 5X9 LF (GAUZE/BANDAGES/DRESSINGS) ×2 IMPLANT
GLOVE BIOGEL PI IND STRL 8 (GLOVE) ×1 IMPLANT
GLOVE BIOGEL PI INDICATOR 8 (GLOVE) ×1
GLOVE SURG ORTHO 8.0 STRL STRW (GLOVE) ×2 IMPLANT
GOWN PREVENTION PLUS LG XLONG (DISPOSABLE) ×2 IMPLANT
GOWN PREVENTION PLUS XLARGE (GOWN DISPOSABLE) ×2 IMPLANT
GOWN STRL NON-REIN LRG LVL3 (GOWN DISPOSABLE) ×4 IMPLANT
IMMOBILIZER KNEE 22 UNIV (SOFTGOODS) ×2 IMPLANT
K-WIRE SMOOTH (WIRE) ×6
KIT BASIN OR (CUSTOM PROCEDURE TRAY) ×2 IMPLANT
KIT INFUSE MEDIUM (Orthopedic Implant) ×2 IMPLANT
KIT ROOM TURNOVER OR (KITS) ×2 IMPLANT
KWIRE SMOOTH (WIRE) ×3 IMPLANT
MANIFOLD NEPTUNE II (INSTRUMENTS) ×2 IMPLANT
NEEDLE 22X1 1/2 (OR ONLY) (NEEDLE) ×2 IMPLANT
NS IRRIG 1000ML POUR BTL (IV SOLUTION) ×2 IMPLANT
PACK ORTHO EXTREMITY (CUSTOM PROCEDURE TRAY) ×2 IMPLANT
PAD ARMBOARD 7.5X6 YLW CONV (MISCELLANEOUS) ×4 IMPLANT
PAD CAST 4YDX4 CTTN HI CHSV (CAST SUPPLIES) IMPLANT
PADDING CAST COTTON 4X4 STRL (CAST SUPPLIES)
PADDING CAST COTTON 6X4 STRL (CAST SUPPLIES) IMPLANT
RECON PLATE LT 4 HOLE (Plate) ×2 IMPLANT
SCREW CANN 5.0X50MM (Screw) ×2 IMPLANT
SCREW CANN SM 50X5XSLF DRL CAN (Screw) ×2 IMPLANT
SCREW LAG CANN CANC 5.0 20X60 (Screw) ×2 IMPLANT
SCREW NLOCK CORT 4.5X50 (Screw) ×2 IMPLANT
SCREW NLOCK DIST FEM 5.5X70 (Screw) ×2 IMPLANT
SPONGE GAUZE 4X4 12PLY (GAUZE/BANDAGES/DRESSINGS) IMPLANT
SPONGE LAP 18X18 X RAY DECT (DISPOSABLE) ×2 IMPLANT
STAPLER VISISTAT 35W (STAPLE) ×2 IMPLANT
STOCKINETTE IMPERVIOUS LG (DRAPES) ×2 IMPLANT
SUCTION FRAZIER TIP 10 FR DISP (SUCTIONS) ×2 IMPLANT
SUT VIC AB 0 CTB1 27 (SUTURE) ×8 IMPLANT
SUT VIC AB 1 CT1 27 (SUTURE) ×7
SUT VIC AB 1 CT1 27XBRD ANBCTR (SUTURE) ×7 IMPLANT
SUT VIC AB 2-0 CTB1 (SUTURE) ×6 IMPLANT
SYR 20ML ECCENTRIC (SYRINGE) ×2 IMPLANT
TOWEL OR 17X24 6PK STRL BLUE (TOWEL DISPOSABLE) ×2 IMPLANT
TOWEL OR 17X26 10 PK STRL BLUE (TOWEL DISPOSABLE) ×2 IMPLANT
TUBE CONNECTING 12X1/4 (SUCTIONS) ×2 IMPLANT
WATER STERILE IRR 1000ML POUR (IV SOLUTION) IMPLANT
YANKAUER SUCT BULB TIP NO VENT (SUCTIONS) ×6 IMPLANT

## 2013-02-01 NOTE — Progress Notes (Signed)
Patient has wheelchair, backpack, partials, and bag of clothing that have been labeled and will be taken to PACU when patient is taken to OR holding

## 2013-02-01 NOTE — Anesthesia Postprocedure Evaluation (Signed)
  Anesthesia Post-op Note  Patient: Samuel [name]  Procedure(s) Performed: Procedure(s): Right knee plating, bonegrafting (Right)  Patient Location: PACU  Anesthesia Type:General  Level of Consciousness: awake, alert  and oriented  Airway and Oxygen Therapy: Patient Spontanous Breathing and Patient connected to nasal cannula oxygen  Post-op Pain: mild  Post-op Assessment: Post-op Vital signs reviewed, Patient's Cardiovascular Status Stable, Respiratory Function Stable, Patent Airway and Pain level controlled  Post-op Vital Signs: stable  Complications: No apparent anesthesia complications

## 2013-02-01 NOTE — Preoperative (Signed)
Beta Blockers   Reason not to administer Beta Blockers:Not Applicable 

## 2013-02-01 NOTE — OR Nursing (Signed)
Poor eye contact/ when asked if he is hurting, nods head l to r to say "no"/ nods head up and down as in yes when asked if he's warm enough
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
## 2013-02-01 NOTE — Transfer of Care (Signed)
Immediate Anesthesia Transfer of Care Note  Patient: Samuel [name]  Procedure(s) Performed: Procedure(s): Right knee plating, bonegrafting (Right)  Patient Location: PACU  Anesthesia Type:General  Level of Consciousness: awake and sedated  Airway & Oxygen Therapy: Patient connected to face mask oxygen  Post-op Assessment: Report given to PACU RN  Post vital signs: stable  Complications: No apparent anesthesia complications

## 2013-02-01 NOTE — Anesthesia Procedure Notes (Signed)
Procedure Name: Intubation Date/Time: 02/01/2013 11:27 AM Performed by: Ellin Goodie Pre-anesthesia Checklist: Patient identified, Emergency Drugs available, Suction available, Patient being monitored and Timeout performed Patient Re-evaluated:Patient Re-evaluated prior to inductionOxygen Delivery Method: Circle system utilized Preoxygenation: Pre-oxygenation with 100% oxygen Intubation Type: IV induction Ventilation: Oral airway inserted - appropriate to patient size and Mask ventilation without difficulty Laryngoscope Size: Mac and 4 Grade View: Grade I Tube type: Oral Tube size: 7.5 mm Number of attempts: 1 Airway Equipment and Method: Stylet and Oral airway Placement Confirmation: ETT inserted through vocal cords under direct vision,  positive ETCO2 and breath sounds checked- equal and bilateral Secured at: 23 cm Tube secured with: Tape Dental Injury: Teeth and Oropharynx as per pre-operative assessment  Comments: Easy atraumatic induction and intubation.  Dr. Noreene Larsson verified placement of ETT. Carlynn Herald, CRNA
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
## 2013-02-01 NOTE — Progress Notes (Signed)
02/01/13 0917  OBSTRUCTIVE SLEEP APNEA  Have you ever been diagnosed with sleep apnea through a sleep study? No  Do you snore loudly (loud enough to be heard through closed doors)?  0  Do you often feel tired, fatigued, or sleepy during the daytime? 0  Has anyone observed you stop breathing during your sleep? 0  Do you have, or are you being treated for high blood pressure? 1  BMI more than 35 kg/m2? 1  Age over 56 years old? 1  Neck circumference greater than 40 cm/18 inches? 0  Gender: 1  Obstructive Sleep Apnea Score 4  Score 4 or greater  Results sent to PCP

## 2013-02-01 NOTE — Brief Op Note (Signed)
02/01/2013  3:22 PM  PATIENT:  Samuel [name]  57 y.o. male  PRE-OPERATIVE DIAGNOSIS:  Right knee femoral condyle nonunion  POST-OPERATIVE DIAGNOSIS:  Right knee femoral condyle nonunion  PROCEDURE:  Procedure(s): Right knee plating, bonegrafting  SURGEON:  Surgeon(s): Cammy Copa, MD  ASSISTANT: April Hall rnfa  ANESTHESIA:   general  EBL: 100 ml    Total I/O In: 2000 [I.V.:2000] Out: 1200 [Urine:1200]  BLOOD ADMINISTERED: none  DRAINS: none   LOCAL MEDICATIONS USED:  none  SPECIMEN:  No Specimen  COUNTS:  YES  TOURNIQUET:   Total Tourniquet Time Documented: Thigh (Right) - 120 minutes Total: Thigh (Right) - 120 minutes   DICTATION: .Other Dictation: Dictation Number [id number]  PLAN OF CARE: Admit to inpatient   PATIENT DISPOSITION:  PACU - hemodynamically stable

## 2013-02-02 LAB — URINALYSIS, ROUTINE W REFLEX MICROSCOPIC
Glucose, UA: NEGATIVE mg/dL
Nitrite: POSITIVE — AB
Specific Gravity, Urine: 1.013 (ref 1.005–1.030)
pH: 7.5 (ref 5.0–8.0)

## 2013-02-02 LAB — URINE MICROSCOPIC-ADD ON

## 2013-02-02 LAB — PROTIME-INR: Prothrombin Time: 14.6 seconds (ref 11.6–15.2)

## 2013-02-02 MED ORDER — CIPROFLOXACIN IN D5W 400 MG/200ML IV SOLN
400.0000 mg | Freq: Two times a day (BID) | INTRAVENOUS | Status: DC
  Administered 2013-02-02 – 2013-02-04 (×5): 400 mg via INTRAVENOUS
  Filled 2013-02-02 (×7): qty 200

## 2013-02-02 MED ORDER — WARFARIN SODIUM 7.5 MG PO TABS
7.5000 mg | ORAL_TABLET | Freq: Once | ORAL | Status: AC
  Administered 2013-02-02: 7.5 mg via ORAL
  Filled 2013-02-02: qty 1

## 2013-02-02 NOTE — Progress Notes (Signed)
UR COMPLETED  

## 2013-02-02 NOTE — Progress Notes (Signed)
57yo male admitted for internal fixation with bone grafting in right distal femur, UA showing bacteria, Cx pending, to begni Cipro for UTI.  Will start Cipro 400mg  IV Q12H for SCr <0.8 and monitor Cx and clinical progression.  Vernard Gambles, PharmD, BCPS 02/02/2013 7:45 AM

## 2013-02-02 NOTE — Progress Notes (Signed)
ANTICOAGULATION CONSULT NOTE - Follow Up Consult  Pharmacy Consult for Coumadin  Indication: VTE prophylaxis  No Known Allergies  Vital Signs: Temp: 98.9 F (37.2 C) (07/09 0556) BP: 100/56 mmHg (07/09 0556) Pulse Rate: 92 (07/09 0556)  Labs:  Recent Labs  02/01/13 0904 02/01/13 1735 02/02/13 0425  HGB 12.8*  --   --   HCT 40.0  --   --   PLT 222  --   --   LABPROT  --  13.8 14.6  INR  --  1.08 1.16  CREATININE 0.58  --   --     The CrCl is unknown because both a height and weight (above a minimum accepted value) are required for this calculation.   Medications:  Prescriptions prior to admission  Medication Sig Dispense Refill  . acetaminophen (TYLENOL) 325 MG tablet Take 650 mg by mouth every 6 (six) hours as needed for pain.      . baclofen (LIORESAL) 10 MG tablet Take 10 mg by mouth 4 (four) times daily as needed.      . clonazePAM (KLONOPIN) 0.5 MG tablet Take 1 tablet every 8 hours as needed  90 tablet  5  . clonazePAM (KLONOPIN) 1 MG tablet Take 1 tablet three times a day  90 tablet  5  . cloNIDine (CATAPRES) 0.1 MG tablet Take 0.1 mg by mouth every 6 (six) hours as needed (for s b/p >180 or d b/p >90).      . DULoxetine (CYMBALTA) 60 MG capsule Take 60 mg by mouth daily.      . fentaNYL (DURAGESIC - DOSED MCG/HR) 75 MCG/HR Apply one patch every 47 hours,*Remove old Patch*Rotate Site*  10 patch  0  . folic acid (FOLVITE) 1 MG tablet Take 1 mg by mouth daily.      Marland Kitchen lactulose (CHRONULAC) 10 GM/15ML solution Take 30 mLs by mouth 4 (four) times daily.      Marland Kitchen lamoTRIgine (LAMICTAL) 200 MG tablet Take 200 mg by mouth 2 (two) times daily.      . Melatonin 3 MG CAPS Take 3 mg by mouth at bedtime.      . nitroGLYCERIN (NITROSTAT) 0.4 MG SL tablet Place 0.4 mg under the tongue every 5 (five) minutes as needed for chest pain.      Marland Kitchen oxyCODONE (ROXICODONE) 15 MG immediate release tablet Take 1 tablet (15 mg total) by mouth 3 (three) times daily as needed for pain.  90 tablet   0  . pravastatin (PRAVACHOL) 20 MG tablet Take 60 mg by mouth daily.      . pregabalin (LYRICA) 300 MG capsule Take one capsule three times a day  90 capsule  5  . vitamin B-12 (CYANOCOBALAMIN) 1000 MCG tablet Take 1,000 mcg by mouth daily.      . vitamin C (ASCORBIC ACID) 500 MG tablet Take 500 mg by mouth 2 (two) times daily.      Marland Kitchen zinc sulfate 220 MG capsule Take 220 mg by mouth daily.        Assessment: 57 yo s/p R knee surgery on coumadin for VTE prophylaxis. INR today 1.16.  Goal of Therapy:  INR 2-3 Monitor platelets by anticoagulation protocol: Yes   Plan:  -Continue coumadin 7.5 mg x 1  -daily PT/INR -f/u education  Naarah Borgerding C 02/02/2013,1:41 PM

## 2013-02-02 NOTE — Progress Notes (Signed)
MD on call notified that pt has sediment in urine and it is cloudy with foul odor and having chills with not fever chronic foley use UA and culture ordered UA positive for many bacteria and wbc. Will continue to monitor. Ilean Skill LPN

## 2013-02-02 NOTE — Progress Notes (Signed)
Physical Therapy Evaluation Patient Details Name: Samuel [name] MRN: [id number] DOB: [birth year] Today's Date: 02/02/2013 Time: 7829-5621 PT Time Calculation (min): 40 min  PT Assessment / Plan / Recommendation History of Present Illness  pt L1 paraplegic incomplete with LLE AKA. Now with ORIF right tibial plateau fx that he sustained in Jan 2014 during a fall. Pt currently NWB RLE  Clinical Impression  Pt lethargic and confused this morning and not agreeable to sitting EOB or attempt to transfer. Pt requesting overhead trapeze for bed which would be helpful for pressure relief. Pt performed rolling in bed but participation minimal today due to AMS. PT recommending lift for transfers when pt agreeable. Sliding board not agreeable to pt and may not be a possibility with sacral decub. PT to follow as pt agreeable and tolerates to improve bed mobility and transfers into sitting. Recommend return to SNF upon d/c.    PT Assessment  Patient needs continued PT services    Follow Up Recommendations  SNF;Supervision/Assistance - 24 hour    Does the patient have the potential to tolerate intense rehabilitation      Barriers to Discharge        Equipment Recommendations  None recommended by PT    Recommendations for Other Services     Frequency Min 3X/week    Precautions / Restrictions Precautions Precautions: Fall Precaution Comments: pt with sacral decub buttocks and chronic phantom limb pain left Required Braces or Orthoses: Knee Immobilizer - Right Knee Immobilizer - Right: On at all times Restrictions Weight Bearing Restrictions: Yes RLE Weight Bearing: Non weight bearing Other Position/Activity Restrictions: AKA left   Pertinent Vitals/Pain 9/10 LLE phantom pain      Mobility  Bed Mobility Bed Mobility: Rolling Right;Rolling Left Rolling Right: 1: +2 Total assist;With rail Rolling Right: Patient Percentage: 10% Rolling Left: With rail;1: +2 Total assist Rolling Left:  Patient Percentage: 20% Details for Bed Mobility Assistance: pt experiencing pain with bed mobility, esp sacral decubitus, not RLE pain. Pt attempting to assist with pulling on rail but having difficulty, seems more of a problem solving issue than a strength issue. Encouraged pt to attempt SL to sit but pt adamantly refused to try today Transfers Transfers: Not assessed Details for Transfer Assistance: pt reports that he will not use a sliding board and may be unable due to sacral decub. At this point, lift equip seems most likely method of transfer OOB. Discussed this with pt who did not want to attempt this either Ambulation/Gait Ambulation/Gait Assistance: Other (comment) (N/A) Stairs: No Wheelchair Mobility Wheelchair Mobility: No    Exercises     PT Diagnosis: Acute pain;Altered mental status;Other (comment) (NWB RLE)  PT Problem List: Decreased activity tolerance;Decreased mobility;Decreased cognition;Decreased knowledge of precautions;Decreased safety awareness;Decreased knowledge of use of DME;Obesity;Pain;Decreased skin integrity;Impaired sensation PT Treatment Interventions: DME instruction;Functional mobility training;Therapeutic activities;Therapeutic exercise;Balance training;Cognitive remediation;Patient/family education     PT Goals(Current goals can be found in the care plan section) Acute Rehab PT Goals Patient Stated Goal: stay in bed PT Goal Formulation: Patient unable to participate in goal setting Time For Goal Achievement: 02/16/13 Potential to Achieve Goals: Fair  Visit Information  Last PT Received On: 02/02/13 Assistance Needed: +2 History of Present Illness: pt L1 paraplegic incomplete with LLE AKA. Now with ORIF right tibial plateau fx that he sustained in Jan 2014 during a fall. Pt currently NWB RLE       Prior Functioning  Home Living Family/patient expects to be discharged to:: Skilled nursing facility Prior  Function Level of Independence: Needs  assistance Gait / Transfers Assistance Needed: per pt he was transferring from bed to w/c with pivot transfer on locked right LE. He is not currently agreeable to sliding board transfers and confusion making it difficult to discuss possibilities ADL's / Homemaking Assistance Needed: tot A Communication / Swallowing Assistance Needed: none Communication Communication: Other (comment) (lethargic, some slurring)    Cognition  Cognition Arousal/Alertness: Lethargic;Suspect due to medications Behavior During Therapy:  (confused, lethargic) Overall Cognitive Status: Impaired/Different from baseline Area of Impairment: Attention;Following commands;Awareness;Problem solving Current Attention Level: Focused Memory: Decreased short-term memory Following Commands: Follows one step commands inconsistently Problem Solving: Slow processing;Difficulty sequencing;Requires verbal cues;Requires tactile cues;Decreased initiation General Comments: pt saying he has good upper body strength but then having difficulty pulling on rails, confused by simple bed mobility tasks. Did not remember that nsg had been in room once treatment session was over, could not recall name of facility where he lives    Extremity/Trunk Assessment Upper Extremity Assessment Upper Extremity Assessment: Difficult to assess due to impaired cognition Lower Extremity Assessment Lower Extremity Assessment: RLE deficits/detail;LLE deficits/detail RLE Deficits / Details: KI RLE, he reports that he was able to use right leg to pivot on PTA RLE: Unable to fully assess due to immobilization RLE Sensation:  (no sensation) RLE Coordination: decreased fine motor;decreased gross motor LLE Deficits / Details: AKA, this LE was somewhat functional before AKA LLE Sensation:  (phantom limb pain)   Balance Balance Balance Assessed: No  End of Session PT - End of Session Activity Tolerance: Patient limited by lethargy;Patient limited by pain Patient  left: in bed;with call bell/phone within reach Nurse Communication: Need for lift equipment  GP   Lyanne Co, PT  Acute Rehab Services  [phone]   Lyanne Co 02/02/2013, 10:37 AM

## 2013-02-02 NOTE — Progress Notes (Signed)
Nutrition Brief Note  Patient identified on the [place] Report  Pt reports good appetite with no recent weight changes. Pt denies any skin breakdown at this time. 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   BMI: 38.2 - Obesity Class II  Current diet order is Heart Healthy, patient is consuming approximately 100% of meals at this time. Labs and medications reviewed.   No nutrition interventions warranted at this time. If nutrition issues arise, please consult RD.   Kendell Bane RD, LDN, CNSC [phone] Pager [phone] After Hours Pager

## 2013-02-02 NOTE — Op Note (Signed)
NAME:  Samuel [name], Samuel [name] NO.:  [id number]  MEDICAL RECORD NO.:  [id number]  LOCATION:  [id number]                        FACILITY:  [facility]  PHYSICIAN:  Burnard Bunting, M.D.    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  PROCEDURE:  OPEN REDUCTION, INTERNAL FIXATION AND BONE GRAFTING OF RIGHT DISTAL FEMUR FRACTURE.  SURGEON:  Burnard Bunting, M.D.  ASSISTANT:  Vickii Penna, RNFA.  INDICATIONS:  Mr. Vandenberghe is a 57 year old patient with right leg instability presents for operative management of nonunion, lateral femoral condyle Hoffa type fracture on the distal femur.  PROCEDURE IN DETAIL:  The patient was brought to the operating room, where general endotracheal anesthesia was induced.  Preoperative antibiotics administered.  Time-out was called.  Right leg was prescrubbed with alcohol, Betadine.  Prepped with DuraPrep solution including foot and draped in a sterile manner.  The straight leg was elevated, exsanguinated with an Esmarch wrap.  Tourniquet was inflated. Total tourniquet time 2 hours at 300 mmHg.  Lateral approach to the femur was made.  Skin and subcu tissue sharply divided.  Fascia lata was divided distally along the iliotibial band.  The capsule was then incised.  Nonunion was visualized.  Using a figure 4 position, the nonunion was opened.  Using a curette and sharp dissection, the capsule was opened.  The fracture nonunion visualized and it was opened and soft tissue was removed.  At this time, thorough irrigation was performed at the fracture site, and 3.5 cannulated screws were placed under fluoroscopic guidance, anterior-to-posterior to secure the fragment.  A T-plate was then placed in buttress fashion for  further reinforcement. Good reduction was achieved.  At this time, the tourniquet was released. Bleeding points were controlled with electrocautery.  Thorough irrigation was performed.  The capsule was closed using #1 Vicryl suture, interrupted inverted #1 Vicryl sutures to close the fascia lata followed by interrupted inverted 0 Vicryl sutures and skin staples.  The knee immobilizer was placed.  The patient tolerated the procedure well without immediate complications.     Burnard Bunting, M.D.     GSD/MEDQ  D:  02/01/2013  T:  02/02/2013  Job:  [id number]

## 2013-02-02 NOTE — Progress Notes (Signed)
Subjective: Pt stable - a little sedated - pain controlled   Objective: Vital signs in last 24 hours: Temp:  [97.3 F (36.3 C)-99.1 F (37.3 C)] 98.9 F (37.2 C) (07/09 0556) Pulse Rate:  [64-94] 92 (07/09 0556) Resp:  [15-20] 16 (07/09 0556) BP: (100-160)/(56-93) 100/56 mmHg (07/09 0556) SpO2:  [93 %-100 %] 98 % (07/09 0556) FiO2 (%):  [95 %-97 %] 95 % (07/09 0355)  Intake/Output from previous day: 07/08 0701 - 07/09 0700 In: 2000 [I.V.:2000] Out: 2675 [Urine:2675] Intake/Output this shift:    Exam:  Compartment soft  Labs:  Recent Labs  02/01/13 0904  HGB 12.8*    Recent Labs  02/01/13 0904  WBC 7.2  RBC 5.01  HCT 40.0  PLT 222    Recent Labs  02/01/13 0904  NA 137  K 3.8  CL 103  CO2 25  BUN 8  CREATININE 0.58  GLUCOSE 108*  CALCIUM 8.9    Recent Labs  02/01/13 1735 02/02/13 0425  INR 1.08 1.16    Assessment/Plan: Dc pca - nwb - cipro for uti - ready for transfer thursday   DEAN,GREGORY SCOTT 02/02/2013, 7:32 AM

## 2013-02-03 LAB — URINE CULTURE: Colony Count: >=100000

## 2013-02-03 LAB — PROTIME-INR: INR: 2.94 — ABNORMAL HIGH (ref 0.00–1.49)

## 2013-02-03 NOTE — Progress Notes (Signed)
Physical Therapy Treatment Patient Details Name: Samuel [name] MRN: [id number] DOB: 1956/04/18 Today's Date: 02/03/2013 Time: 4098-1191 PT Time Calculation (min): 18 min  PT Assessment / Plan / Recommendation  PT Comments   Pt. Remains intermittently lethargic and did now want to attempt to sit over edge of bed.  Fatigued quickly from just longsitting in bed.  Follow Up Recommendations  SNF;Supervision/Assistance - 24 hour     Does the patient have the potential to tolerate intense rehabilitation     Barriers to Discharge        Equipment Recommendations  None recommended by PT    Recommendations for Other Services    Frequency Min 3X/week   Progress towards PT Goals Progress towards PT goals: Progressing toward goals  Plan Current plan remains appropriate    Precautions / Restrictions Precautions Precautions: Fall Precaution Comments: pt with sacral decub buttocks and chronic phantom limb pain left Required Braces or Orthoses: Knee Immobilizer - Right Knee Immobilizer - Right: On at all times Restrictions Weight Bearing Restrictions: Yes RLE Weight Bearing: Non weight bearing Other Position/Activity Restrictions: AKA left   Pertinent Vitals/Pain See vitals tab     Mobility  Bed Mobility Bed Mobility: Supine to Sit;Sit to Supine Supine to Sit: 1: +2 Total assist;With rails;HOB elevated Supine to Sit: Patient Percentage: 50% Sit to Supine: 1: +2 Total assist;HOB elevated;With rail Sit to Supine: Patient Percentage: 50% Details for Bed Mobility Assistance: Pt. with use of bed rails while moving from supine to long sitting in bed.  He declined to sit over edge of bed.  2 assist back into supine position at conclusion of session. Transfers Transfers: Not assessed Ambulation/Gait Ambulation/Gait Assistance: Other (comment) (N/A)    Exercises     PT Diagnosis:    PT Problem List:   PT Treatment Interventions:     PT Goals (current goals can now be found in the  care plan section) Acute Rehab PT Goals Time For Goal Achievement: 02/16/13  Visit Information  Last PT Received On: 02/03/13 Assistance Needed: +2 History of Present Illness: pt L1 paraplegic incomplete with LLE AKA. Now with ORIF right tibial plateau fx that he sustained in Jan 2014 during a fall. Pt currently NWB RLE    Subjective Data  Subjective: I tend to lean toward the left side in sitting   Cognition  Cognition Arousal/Alertness: Lethargic;Suspect due to medications Behavior During Therapy: [facility] for tasks assessed/performed Overall Cognitive Status: Impaired/Different from baseline Area of Impairment: Attention;Following commands;Awareness;Problem solving Current Attention Level: Focused Memory: Decreased short-term memory Following Commands: Follows one step commands inconsistently Problem Solving: Slow processing;Difficulty sequencing;Requires verbal cues;Requires tactile cues;Decreased initiation    Balance  Static Sitting Balance Static Sitting - Balance Support: Bilateral upper extremity supported (in long sitting position) Static Sitting - Level of Assistance: 3: Mod assist Static Sitting - Comment/# of Minutes: 7  End of Session PT - End of Session Activity Tolerance: Patient limited by lethargy;Patient limited by pain Patient left: in bed;with call bell/phone within reach Nurse Communication: Need for lift equipment;Other (comment) (need for pt. to be OOB )   GP     Ferman Hamming 02/03/2013, 3:39 PM Weldon Picking PT Acute Rehab Services [phone] Beeper [phone]

## 2013-02-03 NOTE — Progress Notes (Signed)
Subjective: Pt stable - pain controlled   Objective: Vital signs in last 24 hours: Temp:  [98.6 F (37 C)-99.8 F (37.7 C)] 98.6 F (37 C) (07/10 0536) Pulse Rate:  [87-103] 87 (07/10 0536) Resp:  [18-20] 18 (07/10 0536) BP: (103-117)/(49-85) 103/49 mmHg (07/10 0536) SpO2:  [92 %-99 %] 93 % (07/10 0536)  Intake/Output from previous day: 07/09 0701 - 07/10 0700 In: 840 [P.O.:240; I.V.:600] Out: 1650 [Urine:1650] Intake/Output this shift:    Exam:  No cellulitis present Compartment soft  Labs:  Recent Labs  02/01/13 0904  HGB 12.8*    Recent Labs  02/01/13 0904  WBC 7.2  RBC 5.01  HCT 40.0  PLT 222    Recent Labs  02/01/13 0904  NA 137  K 3.8  CL 103  CO2 25  BUN 8  CREATININE 0.58  GLUCOSE 108*  CALCIUM 8.9    Recent Labs  02/02/13 0425 02/03/13 0435  INR 1.16 2.94*    Assessment/Plan: inr ok - change dressing tomorrow - woc today - may be ready for dc to snf sat - needs sw consult   Samuel [name] 02/03/2013, 8:22 AM

## 2013-02-03 NOTE — Progress Notes (Signed)
Clinical Social Work Department  BRIEF PSYCHOSOCIAL ASSESSMENT  Patient: Samuel [name]  Account Number: [id number]   Admit date: 02/01/13 Clinical Social Worker Samuel [name], MSW Date/Time:  Referred by: Physician Date Referred: 02/03/13 Referred for   SNF Placement   Other Referral:  Interview type: Patient  Other interview type: PSYCHOSOCIAL DATA  Living Status:Husband Admitted from facility:  Level of care:  Primary support name:  Primary support relationship to patient:  Degree of support available:  Strong and vested  CURRENT CONCERNS  Current Concerns   Post-Acute Placement   Other Concerns:  SOCIAL WORK ASSESSMENT / PLAN  CSW met with pt re: PT recommendation for SNF.   Pt lives with   CSW explained placement process and answered questions.   Pt reports [name]  as her preference    CSW completed FL2 and initiated SNF search.     Assessment/plan status: Information/Referral to [name]  Other assessment/ plan:  Information/referral to community resources:  SNF     PATIENT'S/FAMILY'S RESPONSE TO PLAN OF CARE:  Pt  reports she is agreeable to ST SNF in order to increase strength and independence with mobility prior to returning home  Pt verbalized understanding of placement process and appreciation for CSW assist.   Samuel [name], MSW [phone]

## 2013-02-03 NOTE — Progress Notes (Signed)
ANTICOAGULATION CONSULT NOTE - Follow Up Consult  Pharmacy Consult for Coumadin  Indication: VTE prophylaxis  No Known Allergies  Vital Signs: Temp: 98.6 F (37 C) (07/10 0536) BP: 103/49 mmHg (07/10 0536) Pulse Rate: 87 (07/10 0536)  Labs:  Recent Labs  02/01/13 0904 02/01/13 1735 02/02/13 0425 02/03/13 0435  HGB 12.8*  --   --   --   HCT 40.0  --   --   --   PLT 222  --   --   --   LABPROT  --  13.8 14.6 29.6*  INR  --  1.08 1.16 2.94*  CREATININE 0.58  --   --   --     The CrCl is unknown because both a height and weight (above a minimum accepted value) are required for this calculation.   Medications:  Prescriptions prior to admission  Medication Sig Dispense Refill  . acetaminophen (TYLENOL) 325 MG tablet Take 650 mg by mouth every 6 (six) hours as needed for pain.      . baclofen (LIORESAL) 10 MG tablet Take 10 mg by mouth 4 (four) times daily as needed.      . clonazePAM (KLONOPIN) 0.5 MG tablet Take 1 tablet every 8 hours as needed  90 tablet  5  . clonazePAM (KLONOPIN) 1 MG tablet Take 1 tablet three times a day  90 tablet  5  . cloNIDine (CATAPRES) 0.1 MG tablet Take 0.1 mg by mouth every 6 (six) hours as needed (for s b/p >180 or d b/p >90).      . DULoxetine (CYMBALTA) 60 MG capsule Take 60 mg by mouth daily.      . fentaNYL (DURAGESIC - DOSED MCG/HR) 75 MCG/HR Apply one patch every 52 hours,*Remove old Patch*Rotate Site*  10 patch  0  . folic acid (FOLVITE) 1 MG tablet Take 1 mg by mouth daily.      Marland Kitchen lactulose (CHRONULAC) 10 GM/15ML solution Take 30 mLs by mouth 4 (four) times daily.      Marland Kitchen lamoTRIgine (LAMICTAL) 200 MG tablet Take 200 mg by mouth 2 (two) times daily.      . Melatonin 3 MG CAPS Take 3 mg by mouth at bedtime.      . nitroGLYCERIN (NITROSTAT) 0.4 MG SL tablet Place 0.4 mg under the tongue every 5 (five) minutes as needed for chest pain.      Marland Kitchen oxyCODONE (ROXICODONE) 15 MG immediate release tablet Take 1 tablet (15 mg total) by mouth 3  (three) times daily as needed for pain.  90 tablet  0  . pravastatin (PRAVACHOL) 20 MG tablet Take 60 mg by mouth daily.      . pregabalin (LYRICA) 300 MG capsule Take one capsule three times a day  90 capsule  5  . vitamin B-12 (CYANOCOBALAMIN) 1000 MCG tablet Take 1,000 mcg by mouth daily.      . vitamin C (ASCORBIC ACID) 500 MG tablet Take 500 mg by mouth 2 (two) times daily.      Marland Kitchen zinc sulfate 220 MG capsule Take 220 mg by mouth daily.        Assessment: 57 yo s/p R knee surgery on coumadin for VTE prophylaxis.  INR jumped overnight to 2.9 today.  No complications noted.  Drug-Drug Interactions: noted to be on cipro which is known to increase the INR.   Goal of Therapy:  INR 2-3 Monitor platelets by anticoagulation protocol: Yes   Plan:  -Hold coumadin today  -daily PT/INR -  f/u education  Jill Side L. Illene Bolus, PharmD, BCPS Clinical Pharmacist Pager: [phone] Pharmacy: [phone] 02/03/2013 12:05 PM

## 2013-02-03 NOTE — Clinical Documentation Improvement (Signed)
THIS DOCUMENT IS NOT A PERMANENT PART OF THE MEDICAL RECORD  Please update your documentation with the medical record to reflect your response to this query. If you need help knowing how to do this please call [phone]  02/03/13  To Cammy Copa, MD / Associates  In a better effort to capture your patient's severity of illness, reflect appropriate length of stay and utilization of resources, a review of the patient medical record has revealed the following indicators.    Based on your clinical judgment, please clarify and document in a progress note and/or discharge summary the clinical condition associated with the following supporting information:  Per order requested WOC for "skinbreakdwon"  .  WOC note stating patient has "lower buttock wound" "presents like a pressure ulcer (Stage III)"  if this is an appropriate secondary diagnosis please document in notes.  Thank you    Possible Clinical Conditions?   Stage  II Pressure Ulcer  (blister open or unopened)  Stage  III Pressure Ulcer (through all layers skin)  Stage IV Pressure Ulcer   (through skin & underlying  muscle, tendons, and bones)  Other Condition  Cannot Clinically Determine    Support :Measurement:6cm x 1.0cm x 4cm  Wound bed: beefy red with granulation tissue, however some epibole of the wound edges, clean, non necrotic  Drainage (amount, consistency, odor) moderate to heavy serosanguinous, no odor  Periwound: intact, multiple skin folds     Treatment Dressing procedure/placement/frequency: normal saline gauze fluff filled packed into the wound bed, cover with gauze or ABD for drainage control. Change BID. :      Reviewed: has grade 3 stage 3 pressure ulcer pn 02/04/13   Thank Bonita Quin,  Leonette Most Chen Saadeh  Clinical Documentation Specialist: [phone] Health Information Management [place]

## 2013-02-03 NOTE — Consult Note (Signed)
WOC consult Note Reason for Consult: evaluation of lower buttock wound. 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  Wound type: soft tissue ulceration, with his anatomy and hip disarticulation not clear if this site would be over a bony prominence but certainly presents like a pressure ulcer (Stage III) Pressure Ulcer POA: Yes Measurement:6cm x 1.0cm x 4cm  Wound bed: beefy red with granulation tissue, however some epibole of the wound edges, clean, non necrotic Drainage (amount, consistency, odor) moderate to heavy serosanguinous, no odor Periwound: intact, multiple skin folds Dressing procedure/placement/frequency: normal saline gauze fluff filled packed into the wound bed, cover with gauze or ABD for drainage control.  Change BID.   Re consult if needed, will not follow at this time. Thanks  Stephanos Fan [employer], CWOCN [phone])

## 2013-02-03 NOTE — Progress Notes (Signed)
SW spoke with [place] and patient can return when medically stable. SW will assist with all d/c needs.  Sabino Niemann, MSW, [phone]

## 2013-02-04 ENCOUNTER — Encounter (HOSPITAL_COMMUNITY): Payer: Self-pay | Admitting: Orthopedic Surgery

## 2013-02-04 LAB — PROTIME-INR: Prothrombin Time: 35.3 seconds — ABNORMAL HIGH (ref 11.6–15.2)

## 2013-02-04 MED ORDER — WARFARIN SODIUM 5 MG PO TABS: 5.0000 mg | ORAL_TABLET | Freq: Every day | ORAL | Status: DC

## 2013-02-04 NOTE — Discharge Summary (Signed)
Physician Discharge Summary  Patient ID: Samuel [name] MRN: [id number] DOB/AGE: 56-Mar-1957 56 y.o.  Admit date: 02/01/2013 Discharge date: 02/04/2013  Admission Diagnoses:  Right femur fracture  Discharge Diagnoses:  Same  Surgeries: Procedure(s): Right knee plating, bonegrafting on 02/01/2013   Consultants:    Discharged Condition: Stable  Hospital Course: Samuel [name] is an 57 y.o. male who was admitted 02/01/2013 with a chief complaint of right knee instability, and found to have a diagnosis of non union lateral condyle fracture They were brought to the operating room on 02/01/2013 and underwent the above named procedures.   Started on coumadin for dvt prophylaxis - incision ok at dc - nwb for 4 weeks Antibiotics given:  Anti-infectives   Start     Dose/Rate Route Frequency Ordered Stop   02/02/13 0800  ciprofloxacin (CIPRO) IVPB 400 mg     400 mg 200 mL/hr over 60 Minutes Intravenous Every 12 hours 02/02/13 0738     02/01/13 1800  ceFAZolin (ANCEF) IVPB 2 g/50 mL premix     2 g 100 mL/hr over 30 Minutes Intravenous 3 times per day 02/01/13 1636 02/02/13 1430   02/01/13 0600  ceFAZolin (ANCEF) IVPB 2 g/50 mL premix     2 g 100 mL/hr over 30 Minutes Intravenous On call to O.R. 01/31/13 1431 02/01/13 1125    .  Recent vital signs:  Filed Vitals:   02/04/13 0639  BP: 119/62  Pulse: 77  Temp: 98.4 F (36.9 C)  Resp: 18    Recent laboratory studies:  Results for orders placed during the hospital encounter of 02/01/13  URINE CULTURE      Result Value Range   Specimen Description URINE, CATHETERIZED     Special Requests NONE     Culture  Setup Time 02/02/2013 01:03     Colony Count >=100,000 COLONIES/ML     Culture       Value: Multiple bacterial morphotypes present, none predominant. Suggest appropriate recollection if clinically indicated.   Report Status 02/03/2013 FINAL    COMPREHENSIVE METABOLIC PANEL      Result Value Range   Sodium 137  135 - 145 mEq/L    Potassium 3.8  3.5 - 5.1 mEq/L   Chloride 103  96 - 112 mEq/L   CO2 25  19 - 32 mEq/L   Glucose, Bld 108 (*) 70 - 99 mg/dL   BUN 8  6 - 23 mg/dL   Creatinine, Ser 4.09  0.50 - 1.35 mg/dL   Calcium 8.9  8.4 - 81.1 mg/dL   Total Protein 7.1  6.0 - 8.3 g/dL   Albumin 3.3 (*) 3.5 - 5.2 g/dL   AST 20  0 - 37 U/L   ALT 15  0 - 53 U/L   Alkaline Phosphatase 96  39 - 117 U/L   Total Bilirubin 0.4  0.3 - 1.2 mg/dL   GFR calc non Af Amer >90  >90 mL/min   GFR calc Af Amer >90  >90 mL/min  CBC      Result Value Range   WBC 7.2  4.0 - 10.5 K/uL   RBC 5.01  4.22 - 5.81 MIL/uL   Hemoglobin 12.8 (*) 13.0 - 17.0 g/dL   HCT 91.4  78.2 - 95.6 %   MCV 79.8  78.0 - 100.0 fL   MCH 25.5 (*) 26.0 - 34.0 pg   MCHC 32.0  30.0 - 36.0 g/dL   RDW 21.3 (*) 08.6 - 57.8 %   Platelets 222  150 - 400 K/uL  PROTIME-INR      Result Value Range   Prothrombin Time 13.8  11.6 - 15.2 seconds   INR 1.08  0.00 - 1.49  PROTIME-INR      Result Value Range   Prothrombin Time 14.6  11.6 - 15.2 seconds   INR 1.16  0.00 - 1.49  URINALYSIS, ROUTINE W REFLEX MICROSCOPIC      Result Value Range   Color, Urine YELLOW  YELLOW   APPearance TURBID (*) CLEAR   Specific Gravity, Urine 1.013  1.005 - 1.030   pH 7.5  5.0 - 8.0   Glucose, UA NEGATIVE  NEGATIVE mg/dL   Hgb urine dipstick MODERATE (*) NEGATIVE   Bilirubin Urine NEGATIVE  NEGATIVE   Ketones, ur NEGATIVE  NEGATIVE mg/dL   Protein, ur NEGATIVE  NEGATIVE mg/dL   Urobilinogen, UA 0.2  0.0 - 1.0 mg/dL   Nitrite POSITIVE (*) NEGATIVE   Leukocytes, UA LARGE (*) NEGATIVE  URINE MICROSCOPIC-ADD ON      Result Value Range   Squamous Epithelial / LPF FEW (*) RARE   WBC, UA TOO NUMEROUS TO COUNT  <3 WBC/hpf   RBC / HPF 7-10  <3 RBC/hpf   Bacteria, UA MANY (*) RARE   Crystals CA OXALATE CRYSTALS (*) NEGATIVE   Urine-Other LESS THAN 10 mL OF URINE SUBMITTED    PROTIME-INR      Result Value Range   Prothrombin Time 29.6 (*) 11.6 - 15.2 seconds   INR 2.94 (*) 0.00  - 1.49    Discharge Medications:     Medication List         acetaminophen 325 MG tablet  Commonly known as:  TYLENOL  Take 650 mg by mouth every 6 (six) hours as needed for pain.     baclofen 10 MG tablet  Commonly known as:  LIORESAL  Take 10 mg by mouth 4 (four) times daily as needed.     clonazePAM 1 MG tablet  Commonly known as:  KLONOPIN  Take 1 tablet three times a day     clonazePAM 0.5 MG tablet  Commonly known as:  KLONOPIN  Take 1 tablet every 8 hours as needed     cloNIDine 0.1 MG tablet  Commonly known as:  CATAPRES  Take 0.1 mg by mouth every 6 (six) hours as needed (for s b/p >180 or d b/p >90).     DULoxetine 60 MG capsule  Commonly known as:  CYMBALTA  Take 60 mg by mouth daily.     fentaNYL 75 MCG/HR  Commonly known as:  DURAGESIC - dosed mcg/hr  Apply one patch every 39 hours,*Remove old Patch*Rotate Site*     folic acid 1 MG tablet  Commonly known as:  FOLVITE  Take 1 mg by mouth daily.     lactulose 10 GM/15ML solution  Commonly known as:  CHRONULAC  Take 30 mLs by mouth 4 (four) times daily.     lamoTRIgine 200 MG tablet  Commonly known as:  LAMICTAL  Take 200 mg by mouth 2 (two) times daily.     Melatonin 3 MG Caps  Take 3 mg by mouth at bedtime.     nitroGLYCERIN 0.4 MG SL tablet  Commonly known as:  NITROSTAT  Place 0.4 mg under the tongue every 5 (five) minutes as needed for chest pain.     oxyCODONE 15 MG immediate release tablet  Commonly known as:  ROXICODONE  Take 1 tablet (15 mg total) by  mouth 3 (three) times daily as needed for pain.     pravastatin 20 MG tablet  Commonly known as:  PRAVACHOL  Take 60 mg by mouth daily.     pregabalin 300 MG capsule  Commonly known as:  LYRICA  Take one capsule three times a day     vitamin B-12 1000 MCG tablet  Commonly known as:  CYANOCOBALAMIN  Take 1,000 mcg by mouth daily.     vitamin C 500 MG tablet  Commonly known as:  ASCORBIC ACID  Take 500 mg by mouth 2 (two) times  daily.     warfarin 5 MG tablet  Commonly known as:  COUMADIN  Take 1 tablet (5 mg total) by mouth daily.     zinc sulfate 220 MG capsule  Take 220 mg by mouth daily.        Diagnostic Studies: Dg Chest Port 1 View  02/01/2013   *RADIOLOGY REPORT*  Clinical Data: Preoperative examination.  PORTABLE CHEST - 1 VIEW  Comparison: None.  Findings: Low lung volumes with elevation of the right diaphragm. Vascular crowding or atelectasis in the retrocardiac lung.  No evidence of edema.  No pleural effusion or pneumothorax.  Prominent heart size, accentuated by low lung volumes and portable technique. Degenerative thoracic spurring.  Widening of the left AC joint.  IMPRESSION:  1. No active cardiopulmonary disease is suspected. 2.  Low lung volumes with elevated right diaphragm. 3.  Prominent heart size, which may be related to low volumes and portable technique.   Original Report Authenticated By: Tiburcio Pea   Dg C-arm 61-120 Min  02/01/2013   *RADIOLOGY REPORT*  Clinical Data: ORIF of the distal femur  DG C-ARM 61-120 MIN,RIGHT KNEE - 3 VIEW  Comparison: Right knee MRI - 12/31/2012  Findings:  Two spot intraoperative radiographic images of the right knee are provided for review.  Images demonstrate sequela of side plate ORIF of the lateral femoral condyle.  There are 3 additional transfixing lag screws within the anterior aspect of the lateral femoral condyle. Alignment appears near anatomic.  No definite radiopaque foreign body.  IMPRESSION: Post ORIF of the lateral femoral condyle without definite evidence of complication.   Original Report Authenticated By: Tacey Ruiz, MD   Dg Knee 2 Views Right  02/01/2013   *RADIOLOGY REPORT*  Clinical Data: ORIF of the distal femur  DG C-ARM 61-120 MIN,RIGHT KNEE - 3 VIEW  Comparison: Right knee MRI - 12/31/2012  Findings:  Two spot intraoperative radiographic images of the right knee are provided for review.  Images demonstrate sequela of side plate ORIF of the  lateral femoral condyle.  There are 3 additional transfixing lag screws within the anterior aspect of the lateral femoral condyle. Alignment appears near anatomic.  No definite radiopaque foreign body.  IMPRESSION: Post ORIF of the lateral femoral condyle without definite evidence of complication.   Original Report Authenticated By: Tacey Ruiz, MD    Disposition: Final discharge disposition not confirmed       Signed: DEAN,GREGORY SCOTT 02/04/2013, 8:16 AM

## 2013-02-04 NOTE — Progress Notes (Signed)
ANTICOAGULATION CONSULT NOTE - Follow Up Consult  Pharmacy Consult for Coumadin  Indication: VTE prophylaxis  No Known Allergies  Vital Signs: Temp: 98.4 F (36.9 C) (07/11 0639) Temp src: Oral (07/11 0639) BP: 119/62 mmHg (07/11 0639) Pulse Rate: 77 (07/11 0639)  Labs:  Recent Labs  02/02/13 0425 02/03/13 0435 02/04/13 1035  LABPROT 14.6 29.6* 35.3*  INR 1.16 2.94* 3.70*    Estimated Creatinine Clearance: 121.7 ml/min (by C-G formula based on Cr of 0.58).   Medications:  Prescriptions prior to admission  Medication Sig Dispense Refill  . acetaminophen (TYLENOL) 325 MG tablet Take 650 mg by mouth every 6 (six) hours as needed for pain.      . baclofen (LIORESAL) 10 MG tablet Take 10 mg by mouth 4 (four) times daily as needed.      . clonazePAM (KLONOPIN) 0.5 MG tablet Take 1 tablet every 8 hours as needed  90 tablet  5  . clonazePAM (KLONOPIN) 1 MG tablet Take 1 tablet three times a day  90 tablet  5  . cloNIDine (CATAPRES) 0.1 MG tablet Take 0.1 mg by mouth every 6 (six) hours as needed (for s b/p >180 or d b/p >90).      . DULoxetine (CYMBALTA) 60 MG capsule Take 60 mg by mouth daily.      . fentaNYL (DURAGESIC - DOSED MCG/HR) 75 MCG/HR Apply one patch every 37 hours,*Remove old Patch*Rotate Site*  10 patch  0  . folic acid (FOLVITE) 1 MG tablet Take 1 mg by mouth daily.      Marland Kitchen lactulose (CHRONULAC) 10 GM/15ML solution Take 30 mLs by mouth 4 (four) times daily.      Marland Kitchen lamoTRIgine (LAMICTAL) 200 MG tablet Take 200 mg by mouth 2 (two) times daily.      . Melatonin 3 MG CAPS Take 3 mg by mouth at bedtime.      . nitroGLYCERIN (NITROSTAT) 0.4 MG SL tablet Place 0.4 mg under the tongue every 5 (five) minutes as needed for chest pain.      Marland Kitchen oxyCODONE (ROXICODONE) 15 MG immediate release tablet Take 1 tablet (15 mg total) by mouth 3 (three) times daily as needed for pain.  90 tablet  0  . pravastatin (PRAVACHOL) 20 MG tablet Take 60 mg by mouth daily.      . pregabalin  (LYRICA) 300 MG capsule Take one capsule three times a day  90 capsule  5  . vitamin B-12 (CYANOCOBALAMIN) 1000 MCG tablet Take 1,000 mcg by mouth daily.      . vitamin C (ASCORBIC ACID) 500 MG tablet Take 500 mg by mouth 2 (two) times daily.      Marland Kitchen zinc sulfate 220 MG capsule Take 220 mg by mouth daily.        Assessment: 57 yo s/p R knee surgery on coumadin for VTE prophylaxis.  INR jumped again overnight 2.9 >> 3.7 after holding a dose yesterday.  No complications noted.  Drug-Drug Interactions: noted to be on cipro which is known to increase the INR.   Goal of Therapy:  INR 2-3 Monitor platelets by anticoagulation protocol: Yes   Plan:  -Hold coumadin today  -daily PT/INR -f/u education  Bette Brienza C. Fidencia Mccloud, PharmD Clinical Pharmacist-Resident Pager: [phone] Pharmacy: [phone] 02/04/2013 12:52 PM

## 2013-02-04 NOTE — Progress Notes (Signed)
i agree with woc nurse that he has grade 3 stage 3 pressure ulcer - he had this before admission and will have it on dc. Pt states he is getting rx for this at his facility.

## 2013-02-04 NOTE — Progress Notes (Signed)
Subjective: Pt stable - minimal pain   Objective: Vital signs in last 24 hours: Temp:  [98.4 F (36.9 C)-99.3 F (37.4 C)] 98.4 F (36.9 C) (07/11 0639) Pulse Rate:  [77-92] 77 (07/11 0639) Resp:  [18] 18 (07/11 0639) BP: (119-146)/(62-67) 119/62 mmHg (07/11 0639) SpO2:  [98 %-100 %] 99 % (07/11 0639) Weight:  [117.5 kg (259 lb 0.7 oz)] 117.5 kg (259 lb 0.7 oz) (07/10 1420)  Intake/Output from previous day: 07/10 0701 - 07/11 0700 In: 480 [P.O.:480] Out: 1560 [Urine:1550; Stool:10] Intake/Output this shift:    Exam:  incision ok  Labs:  Recent Labs  02/01/13 0904  HGB 12.8*    Recent Labs  02/01/13 0904  WBC 7.2  RBC 5.01  HCT 40.0  PLT 222    Recent Labs  02/01/13 0904  NA 137  K 3.8  CL 103  CO2 25  BUN 8  CREATININE 0.58  GLUCOSE 108*  CALCIUM 8.9    Recent Labs  02/02/13 0425 02/03/13 0435  INR 1.16 2.94*    Assessment/Plan: Ready for dc today or when bed available   DEAN,GREGORY SCOTT 02/04/2013, 8:09 AM

## 2013-02-08 ENCOUNTER — Non-Acute Institutional Stay (SKILLED_NURSING_FACILITY): Payer: Medicare Other | Admitting: Internal Medicine

## 2013-02-08 DIAGNOSIS — L8993 Pressure ulcer of unspecified site, stage 3: Secondary | ICD-10-CM

## 2013-02-08 DIAGNOSIS — G8220 Paraplegia, unspecified: Secondary | ICD-10-CM

## 2013-02-08 DIAGNOSIS — L8990 Pressure ulcer of unspecified site, unspecified stage: Secondary | ICD-10-CM

## 2013-02-08 DIAGNOSIS — IMO0002 Reserved for concepts with insufficient information to code with codable children: Secondary | ICD-10-CM

## 2013-02-08 DIAGNOSIS — S7291XN Unspecified fracture of right femur, subsequent encounter for open fracture type IIIA, IIIB, or IIIC with nonunion: Secondary | ICD-10-CM
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
## 2013-02-24 ENCOUNTER — Emergency Department (HOSPITAL_COMMUNITY): Payer: Medicare Other

## 2013-02-24 ENCOUNTER — Encounter (HOSPITAL_COMMUNITY): Payer: Self-pay | Admitting: *Deleted

## 2013-02-24 ENCOUNTER — Inpatient Hospital Stay (HOSPITAL_COMMUNITY)
Admission: EM | Admit: 2013-02-24 | Discharge: 2013-03-04 | DRG: 871 | Disposition: A | Discharge status: Skilled Nursing Facility | Payer: Medicare Other | Attending: Internal Medicine | Admitting: Internal Medicine

## 2013-02-24 DIAGNOSIS — G8220 Paraplegia, unspecified: Secondary | ICD-10-CM | POA: Diagnosis present

## 2013-02-24 DIAGNOSIS — E872 Acidosis, unspecified: Secondary | ICD-10-CM | POA: Diagnosis not present

## 2013-02-24 DIAGNOSIS — J9600 Acute respiratory failure, unspecified whether with hypoxia or hypercapnia: Secondary | ICD-10-CM | POA: Diagnosis not present

## 2013-02-24 DIAGNOSIS — K219 Gastro-esophageal reflux disease without esophagitis: Secondary | ICD-10-CM | POA: Diagnosis present

## 2013-02-24 DIAGNOSIS — G9340 Encephalopathy, unspecified: Secondary | ICD-10-CM

## 2013-02-24 DIAGNOSIS — D72829 Elevated white blood cell count, unspecified: Secondary | ICD-10-CM | POA: Diagnosis present

## 2013-02-24 DIAGNOSIS — N179 Acute kidney failure, unspecified: Secondary | ICD-10-CM

## 2013-02-24 DIAGNOSIS — N170 Acute kidney failure with tubular necrosis: Secondary | ICD-10-CM | POA: Diagnosis present

## 2013-02-24 DIAGNOSIS — N319 Neuromuscular dysfunction of bladder, unspecified: Secondary | ICD-10-CM | POA: Diagnosis present

## 2013-02-24 DIAGNOSIS — E669 Obesity, unspecified: Secondary | ICD-10-CM | POA: Diagnosis present

## 2013-02-24 DIAGNOSIS — R791 Abnormal coagulation profile: Secondary | ICD-10-CM | POA: Diagnosis present

## 2013-02-24 DIAGNOSIS — N390 Urinary tract infection, site not specified: Secondary | ICD-10-CM

## 2013-02-24 DIAGNOSIS — Z933 Colostomy status: Secondary | ICD-10-CM

## 2013-02-24 DIAGNOSIS — G546 Phantom limb syndrome with pain: Secondary | ICD-10-CM

## 2013-02-24 DIAGNOSIS — I10 Essential (primary) hypertension: Secondary | ICD-10-CM

## 2013-02-24 DIAGNOSIS — G8929 Other chronic pain: Secondary | ICD-10-CM

## 2013-02-24 DIAGNOSIS — E785 Hyperlipidemia, unspecified: Secondary | ICD-10-CM

## 2013-02-24 DIAGNOSIS — D689 Coagulation defect, unspecified: Secondary | ICD-10-CM

## 2013-02-24 DIAGNOSIS — B1920 Unspecified viral hepatitis C without hepatic coma: Secondary | ICD-10-CM | POA: Diagnosis present

## 2013-02-24 DIAGNOSIS — L8994 Pressure ulcer of unspecified site, stage 4: Secondary | ICD-10-CM | POA: Diagnosis present

## 2013-02-24 DIAGNOSIS — F319 Bipolar disorder, unspecified: Secondary | ICD-10-CM

## 2013-02-24 DIAGNOSIS — A419 Sepsis, unspecified organism: Principal | ICD-10-CM

## 2013-02-24 DIAGNOSIS — Z79899 Other long term (current) drug therapy: Secondary | ICD-10-CM

## 2013-02-24 DIAGNOSIS — G40909 Epilepsy, unspecified, not intractable, without status epilepticus: Secondary | ICD-10-CM | POA: Diagnosis present

## 2013-02-24 DIAGNOSIS — Z6841 Body Mass Index (BMI) 40.0 and over, adult: Secondary | ICD-10-CM

## 2013-02-24 DIAGNOSIS — IMO0002 Reserved for concepts with insufficient information to code with codable children: Secondary | ICD-10-CM

## 2013-02-24 DIAGNOSIS — E876 Hypokalemia: Secondary | ICD-10-CM

## 2013-02-24 DIAGNOSIS — L89109 Pressure ulcer of unspecified part of back, unspecified stage: Secondary | ICD-10-CM | POA: Diagnosis present

## 2013-02-24 DIAGNOSIS — G4700 Insomnia, unspecified: Secondary | ICD-10-CM

## 2013-02-24 DIAGNOSIS — E538 Deficiency of other specified B group vitamins: Secondary | ICD-10-CM | POA: Diagnosis present

## 2013-02-24 HISTORY — DX: Other psychoactive substance abuse, uncomplicated: F19.10

## 2013-02-24 LAB — POCT I-STAT 3, VENOUS BLOOD GAS (G3P V)
Acid-base deficit: 3 mmol/L — ABNORMAL HIGH (ref 0.0–2.0)
Bicarbonate: 22.2 mEq/L (ref 20.0–24.0)
O2 Saturation: 48 %
TCO2: 23 mmol/L (ref 0–100)

## 2013-02-24 LAB — URINALYSIS, ROUTINE W REFLEX MICROSCOPIC
Bilirubin Urine: NEGATIVE
Glucose, UA: NEGATIVE mg/dL
Ketones, ur: 15 mg/dL — AB
Protein, ur: 30 mg/dL — AB
Urobilinogen, UA: 0.2 mg/dL (ref 0.0–1.0)

## 2013-02-24 LAB — CBC WITH DIFFERENTIAL/PLATELET
Basophils Relative: 0 % (ref 0–1)
Eosinophils Absolute: 0.2 10*3/uL (ref 0.0–0.7)
Eosinophils Relative: 1 % (ref 0–5)
Lymphs Abs: 0.9 10*3/uL (ref 0.7–4.0)
MCH: 25.2 pg — ABNORMAL LOW (ref 26.0–34.0)
MCHC: 31.9 g/dL (ref 30.0–36.0)
MCV: 79 fL (ref 78.0–100.0)
Monocytes Relative: 5 % (ref 3–12)
Neutrophils Relative %: 90 % — ABNORMAL HIGH (ref 43–77)
Platelets: 367 10*3/uL (ref 150–400)
RBC: 4.52 MIL/uL (ref 4.22–5.81)

## 2013-02-24 LAB — PROTIME-INR: Prothrombin Time: 39.2 seconds — ABNORMAL HIGH (ref 11.6–15.2)

## 2013-02-24 LAB — COMPREHENSIVE METABOLIC PANEL
Albumin: 3.1 g/dL — ABNORMAL LOW (ref 3.5–5.2)
BUN: 7 mg/dL (ref 6–23)
Calcium: 8.9 mg/dL (ref 8.4–10.5)
GFR calc Af Amer: >90 mL/min (ref >90)
Glucose, Bld: 116 mg/dL — ABNORMAL HIGH (ref 70–99)
Potassium: 3.9 mEq/L (ref 3.5–5.1)
Sodium: 136 mEq/L (ref 135–145)
Total Protein: 7.6 g/dL (ref 6.0–8.3)

## 2013-02-24 LAB — URINE MICROSCOPIC-ADD ON

## 2013-02-24 LAB — MRSA PCR SCREENING: MRSA by PCR: POSITIVE — AB

## 2013-02-24 LAB — TYPE AND SCREEN: ABO/RH(D): O POS

## 2013-02-24 MED ORDER — LACTULOSE 10 GM/15ML PO SOLN
20.0000 g | Freq: Four times a day (QID) | ORAL | Status: DC
  Administered 2013-02-24 – 2013-03-04 (×21): 20 g via ORAL
  Filled 2013-02-24 (×33): qty 30

## 2013-02-24 MED ORDER — ACETAMINOPHEN 650 MG RE SUPP
650.0000 mg | Freq: Once | RECTAL | Status: AC
  Administered 2013-02-24: 650 mg via RECTAL
  Filled 2013-02-24: qty 1

## 2013-02-24 MED ORDER — PIPERACILLIN-TAZOBACTAM 3.375 G IVPB
3.3750 g | Freq: Once | INTRAVENOUS | Status: AC
  Administered 2013-02-24: 3.375 g via INTRAVENOUS
  Filled 2013-02-24: qty 50

## 2013-02-24 MED ORDER — PIPERACILLIN-TAZOBACTAM 3.375 G IVPB
3.3750 g | Freq: Three times a day (TID) | INTRAVENOUS | Status: DC
  Administered 2013-02-25 – 2013-02-28 (×11): 3.375 g via INTRAVENOUS
  Filled 2013-02-24 (×12): qty 50

## 2013-02-24 MED ORDER — SODIUM CHLORIDE 0.9 % IV SOLN
INTRAVENOUS | Status: AC
  Administered 2013-02-24 – 2013-02-25 (×2): via INTRAVENOUS

## 2013-02-24 MED ORDER — LAMOTRIGINE 200 MG PO TABS
200.0000 mg | ORAL_TABLET | Freq: Two times a day (BID) | ORAL | Status: DC
  Administered 2013-02-24 – 2013-02-27 (×6): 200 mg via ORAL
  Filled 2013-02-24 (×7): qty 1

## 2013-02-24 MED ORDER — ACETAMINOPHEN 650 MG RE SUPP: 650.0000 mg | Freq: Four times a day (QID) | RECTAL | Status: DC | PRN

## 2013-02-24 MED ORDER — FENTANYL 50 MCG/HR TD PT72
75.0000 ug | MEDICATED_PATCH | TRANSDERMAL | Status: DC
  Administered 2013-02-24 – 2013-03-02 (×3): 75 ug via TRANSDERMAL
  Filled 2013-02-24 (×2): qty 1
  Filled 2013-02-24: qty 2
  Filled 2013-02-24: qty 1

## 2013-02-24 MED ORDER — VANCOMYCIN HCL IN DEXTROSE 1-5 GM/200ML-% IV SOLN
1000.0000 mg | Freq: Two times a day (BID) | INTRAVENOUS | Status: DC
  Administered 2013-02-25 – 2013-02-26 (×3): 1000 mg via INTRAVENOUS
  Filled 2013-02-24 (×4): qty 200

## 2013-02-24 MED ORDER — SODIUM CHLORIDE 0.9 % IV BOLUS (SEPSIS)
1000.0000 mL | Freq: Once | INTRAVENOUS | Status: AC
  Administered 2013-02-24: 1000 mL via INTRAVENOUS

## 2013-02-24 MED ORDER — ONDANSETRON HCL 4 MG PO TABS: 4.0000 mg | ORAL_TABLET | Freq: Four times a day (QID) | ORAL | Status: DC | PRN

## 2013-02-24 MED ORDER — LORAZEPAM 2 MG/ML IJ SOLN
0.5000 mg | Freq: Four times a day (QID) | INTRAMUSCULAR | Status: DC | PRN
  Administered 2013-03-01 (×2): 0.5 mg via INTRAVENOUS
  Filled 2013-02-24 (×2): qty 1

## 2013-02-24 MED ORDER — FOLIC ACID 1 MG PO TABS
1.0000 mg | ORAL_TABLET | Freq: Every day | ORAL | Status: DC
  Administered 2013-02-24 – 2013-03-04 (×9): 1 mg via ORAL
  Filled 2013-02-24 (×9): qty 1

## 2013-02-24 MED ORDER — VANCOMYCIN HCL 10 G IV SOLR
2000.0000 mg | Freq: Once | INTRAVENOUS | Status: AC
  Administered 2013-02-24: 2000 mg via INTRAVENOUS
  Filled 2013-02-24: qty 2000

## 2013-02-24 MED ORDER — SODIUM CHLORIDE 0.9 % IJ SOLN
3.0000 mL | Freq: Two times a day (BID) | INTRAMUSCULAR | Status: DC
  Administered 2013-02-24 – 2013-03-02 (×11): 3 mL via INTRAVENOUS

## 2013-02-24 MED ORDER — DULOXETINE HCL 60 MG PO CPEP
60.0000 mg | ORAL_CAPSULE | Freq: Every day | ORAL | Status: DC
  Administered 2013-02-24 – 2013-02-26 (×3): 60 mg via ORAL
  Filled 2013-02-24 (×3): qty 1

## 2013-02-24 MED ORDER — ONDANSETRON HCL 4 MG/2ML IJ SOLN
4.0000 mg | Freq: Four times a day (QID) | INTRAMUSCULAR | Status: DC | PRN
  Administered 2013-03-02: 4 mg via INTRAVENOUS
  Filled 2013-02-24: qty 2

## 2013-02-24 MED ORDER — ACETAMINOPHEN 325 MG PO TABS
650.0000 mg | ORAL_TABLET | Freq: Four times a day (QID) | ORAL | Status: DC | PRN
  Administered 2013-03-01: 650 mg via ORAL
  Filled 2013-02-24: qty 2

## 2013-02-24 MED ORDER — WARFARIN - PHARMACIST DOSING INPATIENT
Freq: Every day | Status: DC
  Administered 2013-02-27 – 2013-03-02 (×2)

## 2013-02-24 MED ORDER — HYDRALAZINE HCL 20 MG/ML IJ SOLN
10.0000 mg | INTRAMUSCULAR | Status: DC | PRN
  Administered 2013-03-01: 10 mg via INTRAVENOUS
  Filled 2013-02-24: qty 1

## 2013-02-24 MED ORDER — BACLOFEN 10 MG PO TABS
10.0000 mg | ORAL_TABLET | Freq: Four times a day (QID) | ORAL | Status: DC | PRN
  Administered 2013-03-02 – 2013-03-03 (×2): 10 mg via ORAL
  Filled 2013-02-24 (×3): qty 1

## 2013-02-24 MED ORDER — PREGABALIN 100 MG PO CAPS
300.0000 mg | ORAL_CAPSULE | Freq: Three times a day (TID) | ORAL | Status: DC
  Administered 2013-02-24 – 2013-02-27 (×8): 300 mg via ORAL
  Filled 2013-02-24 (×8): qty 3

## 2013-02-24 MED ORDER — NITROGLYCERIN 0.4 MG SL SUBL: 0.4000 mg | SUBLINGUAL_TABLET | SUBLINGUAL | Status: DC | PRN

## 2013-02-24 MED ORDER — ZINC SULFATE 220 (50 ZN) MG PO CAPS
220.0000 mg | ORAL_CAPSULE | Freq: Every day | ORAL | Status: DC
  Administered 2013-02-24 – 2013-03-04 (×9): 220 mg via ORAL
  Filled 2013-02-24 (×9): qty 1

## 2013-02-24 MED ORDER — THIAMINE HCL 100 MG/ML IJ SOLN
100.0000 mg | Freq: Once | INTRAMUSCULAR | Status: AC
  Administered 2013-02-24: 100 mg via INTRAVENOUS
  Filled 2013-02-24: qty 2

## 2013-02-24 MED ORDER — VITAMIN B-12 1000 MCG PO TABS
1000.0000 ug | ORAL_TABLET | Freq: Every day | ORAL | Status: DC
Start: 2013-02-24 — End: 2013-03-04
  Administered 2013-02-24 – 2013-03-04 (×9): 1000 ug via ORAL
  Filled 2013-02-24 (×9): qty 1

## 2013-02-24 MED ORDER — ATORVASTATIN CALCIUM 10 MG PO TABS
10.0000 mg | ORAL_TABLET | Freq: Every day | ORAL | Status: DC
  Administered 2013-02-24 – 2013-03-04 (×9): 10 mg via ORAL
  Filled 2013-02-24 (×9): qty 1

## 2013-02-24 MED ORDER — OXYCODONE HCL 5 MG PO TABS: 15.0000 mg | ORAL_TABLET | Freq: Three times a day (TID) | ORAL | Status: DC | PRN

## 2013-02-24 NOTE — ED Notes (Signed)
Pt from [facility] facility. Staff called EMS secondary to pt's altered mental status. Upon arrival , EMS found pt to be lethargic but able to arouse and say his name. EMS removed fentanyl patch from pt's right arm. They were unable to start a PIV. Pt's vitals: 176/96, HR 110, CBG 96, O2 sats on RA 98%. Pt has an indwelling catheter from the facility and a colostomy bag on LUQ.
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
## 2013-02-24 NOTE — ED Provider Notes (Signed)
Seen on arrival. Level V caveat altered mental status. Patient arrived from nursing home, responsive to noxious stimulus with opening eyes only. Noted to be febrile on exam chronically ill-appearing opens eyes to noxious stimulus, nonverbal lungs clear auscultation heart tachycardic regular rhythm abdomen obese nontender extremities with left-sided AKA. Neurologic is nonfocal simple commands. 7:05 PM patient is alert, verbal follows simple commands after treatment with intravenous fluids, and intravenous antibiotics. Patient felt to be septic. Source of infection likely urinary tract. Spoke with Dr.Kakrakndy plan step down unit Results for orders placed during the hospital encounter of 02/24/13  CBC WITH DIFFERENTIAL      Result Value Range   WBC 17.7 (*) 4.0 - 10.5 K/uL   RBC 4.52  4.22 - 5.81 MIL/uL   Hemoglobin 11.4 (*) 13.0 - 17.0 g/dL   HCT 21.3 (*) 08.6 - 57.8 %   MCV 79.0  78.0 - 100.0 fL   MCH 25.2 (*) 26.0 - 34.0 pg   MCHC 31.9  30.0 - 36.0 g/dL   RDW 46.9 (*) 62.9 - 52.8 %   Platelets 367  150 - 400 K/uL   Neutrophils Relative % 90 (*) 43 - 77 %   Neutro Abs 15.8 (*) 1.7 - 7.7 K/uL   Lymphocytes Relative 5 (*) 12 - 46 %   Lymphs Abs 0.9  0.7 - 4.0 K/uL   Monocytes Relative 5  3 - 12 %   Monocytes Absolute 0.8  0.1 - 1.0 K/uL   Eosinophils Relative 1  0 - 5 %   Eosinophils Absolute 0.2  0.0 - 0.7 K/uL   Basophils Relative 0  0 - 1 %   Basophils Absolute 0.0  0.0 - 0.1 K/uL  COMPREHENSIVE METABOLIC PANEL      Result Value Range   Sodium 136  135 - 145 mEq/L   Potassium 3.9  3.5 - 5.1 mEq/L   Chloride 99  96 - 112 mEq/L   CO2 24  19 - 32 mEq/L   Glucose, Bld 116 (*) 70 - 99 mg/dL   BUN 7  6 - 23 mg/dL   Creatinine, Ser 4.13  0.50 - 1.35 mg/dL   Calcium 8.9  8.4 - 24.4 mg/dL   Total Protein 7.6  6.0 - 8.3 g/dL   Albumin 3.1 (*) 3.5 - 5.2 g/dL   AST 20  0 - 37 U/L   ALT 10  0 - 53 U/L   Alkaline Phosphatase 150 (*) 39 - 117 U/L   Total Bilirubin 0.5  0.3 - 1.2 mg/dL    GFR calc non Af Amer >90  >90 mL/min   GFR calc Af Amer >90  >90 mL/min  PROTIME-INR      Result Value Range   Prothrombin Time 39.2 (*) 11.6 - 15.2 seconds   INR 4.25 (*) 0.00 - 1.49  URINALYSIS, ROUTINE W REFLEX MICROSCOPIC      Result Value Range   Color, Urine YELLOW  YELLOW   APPearance CLOUDY (*) CLEAR   Specific Gravity, Urine 1.010  1.005 - 1.030   pH 7.5  5.0 - 8.0   Glucose, UA NEGATIVE  NEGATIVE mg/dL   Hgb urine dipstick MODERATE (*) NEGATIVE   Bilirubin Urine NEGATIVE  NEGATIVE   Ketones, ur 15 (*) NEGATIVE mg/dL   Protein, ur 30 (*) NEGATIVE mg/dL   Urobilinogen, UA 0.2  0.0 - 1.0 mg/dL   Nitrite NEGATIVE  NEGATIVE   Leukocytes, UA LARGE (*) NEGATIVE  URINE MICROSCOPIC-ADD ON  Result Value Range   Squamous Epithelial / LPF FEW (*) RARE   WBC, UA TOO NUMEROUS TO COUNT  <3 WBC/hpf   RBC / HPF 3-6  <3 RBC/hpf   Bacteria, UA FEW (*) RARE  POCT I-STAT 3, BLOOD GAS (G3P V)      Result Value Range   pH, Ven 7.378 (*) 7.250 - 7.300   pCO2, Ven 37.7 (*) 45.0 - 50.0 mmHg   pO2, Ven 27.0 (*) 30.0 - 45.0 mmHg   Bicarbonate 22.2  20.0 - 24.0 mEq/L   TCO2 23  0 - 100 mmol/L   O2 Saturation 48.0     Acid-base deficit 3.0 (*) 0.0 - 2.0 mmol/L   Collection site BRACHIAL ARTERY     Sample type VENOUS     Comment NOTIFIED PHYSICIAN    CG4 I-STAT (LACTIC ACID)      Result Value Range   Lactic Acid, Venous 1.93  0.5 - 2.2 mmol/L   Ct Head Wo Contrast  02/24/2013   *RADIOLOGY REPORT*  Clinical Data: Altered mental status, lethargy, the patient is on Coumadin  CT HEAD WITHOUT CONTRAST  Technique:  Contiguous axial images were obtained from the base of the skull through the vertex without contrast.  Comparison: None.  Findings: Limited exam because of oblique acquisition.  No definite acute intracranial hemorrhage, mass lesion, infarction, focal edema, or mass effect.  No midline shift, herniation, hydrocephalus, or extra-axial fluid collection.  Cisterns are patent.  No  cerebellar abnormality.  Mastoids and sinuses clear.  IMPRESSION: No acute intracranial process   Original Report Authenticated By: Judie Petit. Shick, M.D.   Dg Chest Portable 1 View  02/24/2013   *RADIOLOGY REPORT*  Clinical Data: Fever.  PORTABLE CHEST - 1 VIEW  Comparison: Chest x-ray 02/01/2013.  Findings: Lung volumes are exceedingly low.  Elevation of the right hemidiaphragm.  Lung bases are poorly visualized.  Areas of atelectasis and/or consolidation are possible in both lung bases, particularly on the left, but these regions are poorly demonstrated on today's supine lordotic under penetrated AP film obtained at low lung volumes.  The possibility of pleural effusions is also difficult to exclude on today's film, but no large effusions are noted.  No evidence of pulmonary edema.  Cardiomegaly is unchanged. The patient is rotated to the left on today's exam, resulting in distortion of the mediastinal contours and reduced diagnostic sensitivity and specificity for mediastinal pathology.  IMPRESSION: 1.  Very limited examination demonstrating extremely low lung volumes and chronic elevation of the right hemidiaphragm.  If there is clinical concern for pneumonia, standing PA and lateral chest radiograph could provide much better diagnostic information.  The patient should also be counseled to take a deep breath prior to obtaining a subsequent films.   Original Report Authenticated By: Trudie Reed, M.D.   Dg Chest Port 1 View  02/01/2013   *RADIOLOGY REPORT*  Clinical Data: Preoperative examination.  PORTABLE CHEST - 1 VIEW  Comparison: None.  Findings: Low lung volumes with elevation of the right diaphragm. Vascular crowding or atelectasis in the retrocardiac lung.  No evidence of edema.  No pleural effusion or pneumothorax.  Prominent heart size, accentuated by low lung volumes and portable technique. Degenerative thoracic spurring.  Widening of the left AC joint.  IMPRESSION:  1. No active cardiopulmonary  disease is suspected. 2.  Low lung volumes with elevated right diaphragm. 3.  Prominent heart size, which may be related to low volumes and portable technique.   Original Report Authenticated  By: Tiburcio Pea   Dg C-arm 61-120 Min  02/01/2013   *RADIOLOGY REPORT*  Clinical Data: ORIF of the distal femur  DG C-ARM 61-120 MIN,RIGHT KNEE - 3 VIEW  Comparison: Right knee MRI - 12/31/2012  Findings:  Two spot intraoperative radiographic images of the right knee are provided for review.  Images demonstrate sequela of side plate ORIF of the lateral femoral condyle.  There are 3 additional transfixing lag screws within the anterior aspect of the lateral femoral condyle. Alignment appears near anatomic.  No definite radiopaque foreign body.  IMPRESSION: Post ORIF of the lateral femoral condyle without definite evidence of complication.   Original Report Authenticated By: Tacey Ruiz, MD   Dg Knee 2 Views Right  02/01/2013   *RADIOLOGY REPORT*  Clinical Data: ORIF of the distal femur  DG C-ARM 61-120 MIN,RIGHT KNEE - 3 VIEW  Comparison: Right knee MRI - 12/31/2012  Findings:  Two spot intraoperative radiographic images of the right knee are provided for review.  Images demonstrate sequela of side plate ORIF of the lateral femoral condyle.  There are 3 additional transfixing lag screws within the anterior aspect of the lateral femoral condyle. Alignment appears near anatomic.  No definite radiopaque foreign body.  IMPRESSION: Post ORIF of the lateral femoral condyle without definite evidence of complication.   Original Report Authenticated By: Tacey Ruiz, MD    Diagnosis #1 sepsis #2 urinary tract infection #3 Coumadin toxicity CRITICAL CARE Performed by: Doug Sou Total critical care time: 30 minutes Critical care time was exclusive of separately billable procedures and treating other patients. Critical care was necessary to treat or prevent imminent or life-threatening deterioration. Critical care  was time spent personally by me on the following activities: development of treatment plan with patient and/or surrogate as well as nursing, discussions with consultants, evaluation of patient's response to treatment, examination of patient, obtaining history from patient or surrogate, ordering and performing treatments and interventions, ordering and review of laboratory studies, ordering and review of radiographic studies, pulse oximetry and re-evaluation of patient's condition.  Doug Sou, MD 02/24/13 [phone]
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
## 2013-02-25 DIAGNOSIS — F319 Bipolar disorder, unspecified: Secondary | ICD-10-CM

## 2013-02-25 LAB — CBC WITH DIFFERENTIAL/PLATELET
Basophils Absolute: 0 10*3/uL (ref 0.0–0.1)
Eosinophils Absolute: 0 10*3/uL (ref 0.0–0.7)
Eosinophils Relative: 0 % (ref 0–5)
HCT: 32.2 % — ABNORMAL LOW (ref 39.0–52.0)
Lymphs Abs: 0.6 10*3/uL — ABNORMAL LOW (ref 0.7–4.0)
MCH: 25.2 pg — ABNORMAL LOW (ref 26.0–34.0)
MCV: 78.2 fL (ref 78.0–100.0)
Monocytes Absolute: 0.9 10*3/uL (ref 0.1–1.0)
Platelets: 320 10*3/uL (ref 150–400)
RDW: 16.8 % — ABNORMAL HIGH (ref 11.5–15.5)

## 2013-02-25 LAB — COMPREHENSIVE METABOLIC PANEL
AST: 19 U/L (ref 0–37)
Albumin: 2.6 g/dL — ABNORMAL LOW (ref 3.5–5.2)
Alkaline Phosphatase: 126 U/L — ABNORMAL HIGH (ref 39–117)
Chloride: 106 mEq/L (ref 96–112)
Potassium: 3.6 mEq/L (ref 3.5–5.1)
Sodium: 140 mEq/L (ref 135–145)
Total Bilirubin: 0.8 mg/dL (ref 0.3–1.2)

## 2013-02-25 LAB — PROTIME-INR
INR: 4.45 — ABNORMAL HIGH (ref 0.00–1.49)
Prothrombin Time: 40.6 seconds — ABNORMAL HIGH (ref 11.6–15.2)

## 2013-02-25 LAB — GLUCOSE, CAPILLARY: Glucose-Capillary: 98 mg/dL (ref 70–99)

## 2013-02-25 MED ORDER — CHLORHEXIDINE GLUCONATE CLOTH 2 % EX PADS
6.0000 | MEDICATED_PAD | Freq: Every day | CUTANEOUS | Status: AC
  Administered 2013-02-25 – 2013-02-28 (×4): 6 via TOPICAL

## 2013-02-25 MED ORDER — PRO-STAT SUGAR FREE PO LIQD
30.0000 mL | Freq: Two times a day (BID) | ORAL | Status: DC
  Administered 2013-02-25 – 2013-03-01 (×7): 30 mL via ORAL
  Filled 2013-02-25 (×16): qty 30

## 2013-02-25 MED ORDER — MUPIROCIN 2 % EX OINT
1.0000 "application " | TOPICAL_OINTMENT | Freq: Two times a day (BID) | CUTANEOUS | Status: AC
  Administered 2013-02-25 – 2013-03-01 (×10): 1 via NASAL
  Filled 2013-02-25: qty 22

## 2013-02-25 NOTE — Progress Notes (Signed)
Utilization review completed. Zakari Bathe, RN, BSN. 

## 2013-02-25 NOTE — Clinical Documentation Improvement (Signed)
THIS DOCUMENT IS NOT A PERMANENT PART OF THE MEDICAL RECORD  Please update your documentation with the medical record to reflect your response to this query. If you need help knowing how to do this please call [phone].  02/25/13   Dear Dr. Calvert Cantor / Associates,  In a better effort to capture your patient's severity of illness, reflect appropriate length of stay and utilization of resources, a review of the patient medical record has revealed the following indicators.    Based on your clinical judgment, please clarify and document in a progress note and/or discharge summary the clinical condition associated with the following supporting information:  In responding to this query please exercise your independent judgment.  The fact that a query is asked, does not imply that any particular answer is desired or expected.  Possible Clinical Conditions?  _______Sepsis related to  UTI from indwelling catheter _______Other Condition__________________ _______Cannot Clinically Determine   Supporting Information:  Per 02/25/13 progress note: chronic Foley catheter, history of hep C, prior alcohol and polysubstance abuse, diverging colostomy,chronic pain,left hip disarticulation with flap surgery, hyperlipidemia bipolar disorder and seizures.  Paraplegia/Colostomy and chronic Foley catheter   You may use possible, probable, or suspect with inpatient documentation. possible, probable, suspected diagnoses MUST be documented at the time of discharge  Reviewed:  no additional documentation provided- chart already states "sepsis - possibly due to UTI"  Thank You,  Elmer Picker RN, BSN, CCM Clinical Documentation Specialist Phone: [phone]   Health Information Management [place]

## 2013-02-25 NOTE — Progress Notes (Signed)
ANTICOAGULATION CONSULT NOTE - Follow Up Consult  Pharmacy Consult for Warfarin Indication: VTE prophylaxis  No Known Allergies  Patient Measurements: Height: 5\' 4"  (162.6 cm) Weight: 247 lb 9.2 oz (112.3 kg) IBW/kg (Calculated) : 59.2  Vital Signs: Temp: 98.2 F (36.8 C) (08/01 0823) Temp src: Oral (08/01 0823) BP: 92/54 mmHg (08/01 0925) Pulse Rate: 83 (08/01 0823)  Labs:  Recent Labs  02/24/13 1634 02/25/13 0545  HGB 11.4* 10.4*  HCT 35.7* 32.2*  PLT 367 320  LABPROT 39.2* 40.6*  INR 4.25* 4.45*  CREATININE 0.68 0.63    Estimated Creatinine Clearance: 118.6 ml/min (by C-G formula based on Cr of 0.63).   Medications:  Scheduled:  . atorvastatin  10 mg Oral Daily  . Chlorhexidine Gluconate Cloth  6 each Topical Q0600  . DULoxetine  60 mg Oral Daily  . fentaNYL  75 mcg Transdermal Q72H  . folic acid  1 mg Oral Daily  . lactulose  20 g Oral QID  . lamoTRIgine  200 mg Oral BID  . mupirocin ointment  1 application Nasal BID  . piperacillin-tazobactam (ZOSYN)  IV  3.375 g Intravenous Q8H  . pregabalin  300 mg Oral TID  . sodium chloride  3 mL Intravenous Q12H  . vancomycin  1,000 mg Intravenous Q12H  . vitamin B-12  1,000 mcg Oral Daily  . Warfarin - Pharmacist Dosing Inpatient   Does not apply q1800  . zinc sulfate  220 mg Oral Daily    Assessment: 57 yo M paraplegic, presented w/ AMS and concern w/ sepsis. Pt started coumadin s/p R knee plating/bone grafting (7/8). Started warfarin inpt 7.5mg  x2, with drastic elevation in INR. Held coumadin, and sent home (7/11) on 5mg  daily to be managed by SNF. Pt INR currently supra therapeutic and continuing to trend up. Per SNF MAR pt started septra due to UTI 7/31. Pt hgb down, and plt wnl. MD noted pts sacral ulcer has mild bleeding.  Goal of Therapy:  INR 2-3 Monitor platelets by anticoagulation protocol: Yes   Plan:  1.Continue to hold warfarin 2.Monitor s/sx of bleed 3. Daily PT/INR   Samuel [name]  M 02/25/2013,10:19 AM

## 2013-02-25 NOTE — Consult Note (Signed)
WOC consult Note Reason for Consult: Pt familiar to [facility] team; refer to progress notes from last admission on 7/10.  Pt had flap surgery over sacrum/buttocks at some point and this has altered normal anatomy. Wound type: Stage 3 to buttocks near rectum Pressure Ulcer POA: Yes Measurement:5X2X4cm; has decreased in size since previous admission. Wound bed: 100% beefy red Drainage (amount, consistency, odor) Small amt pink drainage.  Periwound: Intact skin and scar tissue surrounding. Dressing procedure/placement/frequency: Continue packing Q day with moist gauze.  Air mattress to decrease pressure to site.  Pt has diverting colostomy.  Pouch intact with good seal, mod amt semi-formed stool.  Stoma red and viable when visualized through pouch.  Extra supplies at bedside for staff use. Please re-consult if further assistance is needed.  Thank-you,  Cammie Mcgee MSN, RN, CWOCN, [place], CNS [phone]
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
## 2013-02-25 NOTE — Progress Notes (Addendum)
In room to assit wound nurse with dressing change.

## 2013-02-25 NOTE — Progress Notes (Addendum)
[facility] Progress Note [place] TEAM 1 - Stepdown/ICU TEAM   Samuel [name] WUJ:[id number] DOB: 1956/07/14 DOA: 02/24/2013 PCP: Terald Sleeper, MD  Brief narrative: This is a 57 year old paraplegic patient who lives in a nursing home([facility]) and has a history of hypertension, decubitus ulcer followed at the wound clinic at [facility], chronic Foley catheter, history of hep C, prior alcohol and polysubstance abuse, diverging colostomy,chronic pain,left hip disarticulation with flap surgery, hyperlipidemia bipolar disorder and seizures.   He fractured his right knee 6 months ago. He underwent surgery on the lateral condyle of the right knee on 02/01/2013. He has been on DVT prophylaxis Coumadin since  Patient was brought to the hospital with altered mental status and noted to have a fever.  Assessment/Plan: Principal Problem: Sepsis with Fever and leukocytosis -likely due to UTI -continue vancomycin and Zosyn for broad coverage in this nursing home patient -follow up on cultures  Active Problems:    Encephalopathy acute -patient is now alert therefore will resume diet    Coagulopathy -on Coumadin for DVT prophylaxis after left femoral surgery -Will allow pharmacy to manage    Hypotension with h/o HTN (hypertension) -Takes when necessary clonidine at the facility for systolic blood pressure greater than 180 or diastolic greater than 90 -Has been placed on when necessary hydralazine here but does not need it   Sacral decub Monitored by wound care as outpatient Evaluated by wound care today-does not appear to be infected-has small amount of bloody discharge possibly due to elevated INR    Bipolar disorder, unspecified -Home meds resumed to prevent withdrawal from Benzos    Hepatitis C    Chronic pain -On oxycodone 3 times a day when necessary and Lyrica 3 times a day routine as outpatient-  Paraplegia/Colostomy and chronic Foley catheter  Code Status:  full code Family Communication: none Disposition Plan: follow in SDUdo to hypotension  Consultants: none  Procedures: none  Antibiotics: Vancomycin and Zosyn 7/31>>  DVT prophylaxis: Coumadin  HPI/Subjective: Pt was sleep when evaluated by me- seemed confused when I awakened him- per RN, he is able to take his pills and alert enough now to have diet resumed. She agrees he is confused as well.    Objective: Blood pressure 92/54, pulse 83, temperature 98.2 F (36.8 C), temperature source Oral, resp. rate 25, height 5\' 4"  (1.626 m), weight 112.3 kg (247 lb 9.2 oz), SpO2 96.00%.  Intake/Output Summary (Last 24 hours) at 02/25/13 1113 Last data filed at 02/25/13 0700  Gross per 24 hour  Intake 1312.5 ml  Output   2700 ml  Net -1387.5 ml     Exam: General: No acute respiratory distress Lungs: Clear to auscultation bilaterally without wheezes or crackles Cardiovascular: Regular rate and rhythm without murmur gallop or rub normal S1 and S2 Abdomen: left sided colostomy with brown stool, Nontender, nondistended, soft, bowel sounds positive, no rebound, no ascites, no appreciable mass Extremities: No significant cyanosis, clubbing, or edema bilateral lower extremities- left stump appears clean  Data Reviewed: Basic Metabolic Panel:  Recent Labs Lab 02/24/13 1634 02/25/13 0545  NA 136 140  K 3.9 3.6  CL 99 106  CO2 24 22  GLUCOSE 116* 119*  BUN 7 7  CREATININE 0.68 0.63  CALCIUM 8.9 8.4   Liver Function Tests:  Recent Labs Lab 02/24/13 1634 02/25/13 0545  AST 20 19  ALT 10 9  ALKPHOS 150* 126*  BILITOT 0.5 0.8  PROT 7.6 6.6  ALBUMIN 3.1* 2.6*   No results  found for this basename: LIPASE, AMYLASE,  in the last 168 hours  Recent Labs Lab 02/24/13 1834  AMMONIA 57   CBC:  Recent Labs Lab 02/24/13 1634 02/25/13 0545  WBC 17.7* 18.2*  NEUTROABS 15.8* 16.7*  HGB 11.4* 10.4*  HCT 35.7* 32.2*  MCV 79.0 78.2  PLT 367 320   Cardiac Enzymes: No  results found for this basename: CKTOTAL, CKMB, CKMBINDEX, TROPONINI,  in the last 168 hours BNP (last 3 results) No results found for this basename: PROBNP,  in the last 8760 hours CBG:  Recent Labs Lab 02/24/13 2108 02/25/13 0010 02/25/13 0444 02/25/13 0818  GLUCAP 111* 131* 129* 122*    Recent Results (from the past 240 hour(s))  MRSA PCR SCREENING     Status: Abnormal   Collection Time    02/24/13  8:44 PM      Result Value Range Status   MRSA by PCR POSITIVE (*) NEGATIVE Final   Comment:            The GeneXpert MRSA Assay (FDA     approved for NASAL specimens     only), is one component of a     comprehensive MRSA colonization     surveillance program. It is not     intended to diagnose MRSA     infection nor to guide or     monitor treatment for     MRSA infections.     RESULT CALLED TO, READ BACK BY AND VERIFIED WITH:     Oren Bracket RN 2222 02/24/13 A BROWNING  WOUND CULTURE     Status: None   Collection Time    02/25/13  5:10 AM      Result Value Range Status   Specimen Description WOUND SACRAL   Final   Special Requests NONE   Final   Gram Stain     Final   Value: FEW WBC PRESENT, PREDOMINANTLY PMN     NO SQUAMOUS EPITHELIAL CELLS SEEN     FEW GRAM NEGATIVE RODS   Culture PENDING   Incomplete   Report Status PENDING   Incomplete     Studies:  Recent x-ray studies have been reviewed in detail by the Attending Physician  Scheduled Meds:  Scheduled Meds: . atorvastatin  10 mg Oral Daily  . Chlorhexidine Gluconate Cloth  6 each Topical Q0600  . DULoxetine  60 mg Oral Daily  . fentaNYL  75 mcg Transdermal Q72H  . folic acid  1 mg Oral Daily  . lactulose  20 g Oral QID  . lamoTRIgine  200 mg Oral BID  . mupirocin ointment  1 application Nasal BID  . piperacillin-tazobactam (ZOSYN)  IV  3.375 g Intravenous Q8H  . pregabalin  300 mg Oral TID  . sodium chloride  3 mL Intravenous Q12H  . vancomycin  1,000 mg Intravenous Q12H  . vitamin B-12  1,000 mcg Oral  Daily  . Warfarin - Pharmacist Dosing Inpatient   Does not apply q1800  . zinc sulfate  220 mg Oral Daily   Continuous Infusions: . sodium chloride 125 mL/hr at 02/25/13 0538    Time spent on care of this patient: 35 minutes   Arleta Ostrum, M.D.  [facility] Office  [phone] Pager - Text Page per Loretha Stapler as per below:  On-Call/Text Page:      [email or web address]      password [email or web address]  If 7PM-7AM, please contact night-coverage [email or web address] Password [email or web address] 02/25/2013, 11:13 AM   LOS: 1 day
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
## 2013-02-26 DIAGNOSIS — N179 Acute kidney failure, unspecified: Secondary | ICD-10-CM

## 2013-02-26 LAB — URINE CULTURE: Colony Count: >=100000

## 2013-02-26 LAB — GLUCOSE, CAPILLARY
Glucose-Capillary: 93 mg/dL (ref 70–99)
Glucose-Capillary: 117 mg/dL — ABNORMAL HIGH (ref 70–99)
Glucose-Capillary: 120 mg/dL — ABNORMAL HIGH (ref 70–99)

## 2013-02-26 LAB — CBC
HCT: 30.6 % — ABNORMAL LOW (ref 39.0–52.0)
MCV: 79.3 fL (ref 78.0–100.0)
Platelets: 276 10*3/uL (ref 150–400)
RBC: 3.86 MIL/uL — ABNORMAL LOW (ref 4.22–5.81)
RDW: 17 % — ABNORMAL HIGH (ref 11.5–15.5)
WBC: 9.7 10*3/uL (ref 4.0–10.5)

## 2013-02-26 LAB — URINALYSIS, ROUTINE W REFLEX MICROSCOPIC
Bilirubin Urine: NEGATIVE
Glucose, UA: NEGATIVE mg/dL
Ketones, ur: NEGATIVE mg/dL
pH: 6 (ref 5.0–8.0)

## 2013-02-26 LAB — BASIC METABOLIC PANEL
CO2: 21 mEq/L (ref 19–32)
CO2: 21 mEq/L (ref 19–32)
Calcium: 8 mg/dL — ABNORMAL LOW (ref 8.4–10.5)
Chloride: 103 mEq/L (ref 96–112)
Chloride: 102 mEq/L (ref 96–112)
GFR calc Af Amer: 38 mL/min — ABNORMAL LOW (ref >90)
Sodium: 137 mEq/L (ref 135–145)
Sodium: 135 mEq/L (ref 135–145)

## 2013-02-26 LAB — URINE MICROSCOPIC-ADD ON

## 2013-02-26 LAB — PROTIME-INR
INR: 4.55 — ABNORMAL HIGH (ref 0.00–1.49)
Prothrombin Time: 41.3 seconds — ABNORMAL HIGH (ref 11.6–15.2)

## 2013-02-26 LAB — OSMOLALITY: Osmolality: 288 mOsm/kg (ref 275–300)

## 2013-02-26 NOTE — Progress Notes (Addendum)
[facility] Progress Note [name] TEAM 1 - Stepdown/ICU TEAM   Samuel [name] ZOX:[id number] DOB: 06-12-1956 DOA: 02/24/2013 PCP: Terald Sleeper, MD  Brief narrative: This is a 57 year old paraplegic patient who lives in a nursing home([facility]) and has a history of hypertension, decubitus ulcer followed at the wound clinic at [facility], chronic Foley catheter, history of hep C, prior alcohol and polysubstance abuse, diverging colostomy,chronic pain,left hip disarticulation with flap surgery, hyperlipidemia bipolar disorder and seizures.   He fractured his right knee 6 months ago. He underwent surgery on the lateral condyle of the right knee on 02/01/2013. He has been on DVT prophylaxis Coumadin since  Patient was brought to the hospital with altered mental status and noted to have a fever of 101.6.  Assessment/Plan: Principal Problem: Sepsis with Fever and leukocytosis -likely due to UTI -Unfortunately do to development of acute renal failure today, vancomycin was discontinued this morning -Blood and urine cultures negative so far   Active Problems:  Acute renal failure -Likely due to ATN as granular casts are noted in the urine -Possibly do to vancomycin-stat level ordered -Renal dose all medications-    Encephalopathy acute -patient is now alert and oriented     Coagulopathy -on Coumadin for DVT prophylaxis after left femoral surgery -Will allow pharmacy to manage    Hypotension with h/o HTN (hypertension) -Takes when necessary clonidine at the facility for systolic blood pressure greater than 180 or diastolic greater than 90 -Has been placed on when necessary hydralazine here but does not need it   Sacral decub Monitored by wound care as outpatient Evaluated by wound care today-does not appear to be infected-has small amount of bloody discharge possibly due to elevated INR    Bipolar disorder, unspecified -Home meds resumed to prevent withdrawal from  Benzos -Renal dosing    Hepatitis C    Chronic pain -On fentanyl patch, oxycodone 3 times a day when necessary, Lyrica 3 times a day routine as outpatient and Cymbalta -Have discontinued oxycodone and Cymbalta due to acute renal failure  Paraplegia/Colostomy and chronic Foley catheter  Code Status: full code Family Communication: none Disposition Plan: follow in SDUdo to hypotension  Consultants: none  Procedures: none  Antibiotics: Vancomycin and Zosyn 7/31>>  DVT prophylaxis: Coumadin  HPI/Subjective: Pt is awake and alert today. He has no complaints of pain nausea, diarrhea or constipation. He knows that he is here for an infection and most likely a UTI.   Objective: Blood pressure 128/54, pulse 82, temperature 98.1 F (36.7 C), temperature source Oral, resp. rate 15, height 5\' 4"  (1.626 m), weight 113.2 kg (249 lb 9 oz), SpO2 96.00%.  Intake/Output Summary (Last 24 hours) at 02/26/13 1143 Last data filed at 02/26/13 8119  Gross per 24 hour  Intake    869 ml  Output   1050 ml  Net   -181 ml     Exam: General: No acute respiratory distress Lungs: Clear to auscultation bilaterally without wheezes or crackles Cardiovascular: Regular rate and rhythm without murmur gallop or rub normal S1 and S2 Abdomen: left sided colostomy with brown stool, Nontender, nondistended, soft, bowel sounds positive, no rebound, no ascites, no appreciable mass Extremities: No significant cyanosis, clubbing, or edema bilateral lower extremities- left stump appears clean  Data Reviewed: Basic Metabolic Panel:  Recent Labs Lab 02/24/13 1634 02/25/13 0545 02/26/13 0400  NA 136 140 137  K 3.9 3.6 3.8  CL 99 106 103  CO2 24 22 21   GLUCOSE 116* 119* 99  BUN 7 7 18   CREATININE 0.68 0.63 2.16*  CALCIUM 8.9 8.4 8.0*   Liver Function Tests:  Recent Labs Lab 02/24/13 1634 02/25/13 0545  AST 20 19  ALT 10 9  ALKPHOS 150* 126*  BILITOT 0.5 0.8  PROT 7.6 6.6  ALBUMIN 3.1* 2.6*    No results found for this basename: LIPASE, AMYLASE,  in the last 168 hours  Recent Labs Lab 02/24/13 1834  AMMONIA 57   CBC:  Recent Labs Lab 02/24/13 1634 02/25/13 0545 02/26/13 0400  WBC 17.7* 18.2* 9.7  NEUTROABS 15.8* 16.7*  --   HGB 11.4* 10.4* 9.6*  HCT 35.7* 32.2* 30.6*  MCV 79.0 78.2 79.3  PLT 367 320 276   Cardiac Enzymes: No results found for this basename: CKTOTAL, CKMB, CKMBINDEX, TROPONINI,  in the last 168 hours BNP (last 3 results) No results found for this basename: PROBNP,  in the last 8760 hours CBG:  Recent Labs Lab 02/25/13 1602 02/25/13 2031 02/25/13 2346 02/26/13 0409 02/26/13 0729  GLUCAP 91 98 93 93 117*    Recent Results (from the past 240 hour(s))  URINE CULTURE     Status: None   Collection Time    02/24/13  5:41 PM      Result Value Range Status   Specimen Description URINE, CATHETERIZED   Final   Special Requests NONE   Final   Culture  Setup Time 02/24/2013 19:46   Final   Colony Count PENDING   Incomplete   Culture Culture reincubated for better growth   Final   Report Status PENDING   Incomplete  MRSA PCR SCREENING     Status: Abnormal   Collection Time    02/24/13  8:44 PM      Result Value Range Status   MRSA by PCR POSITIVE (*) NEGATIVE Final   Comment:            The GeneXpert MRSA Assay (FDA     approved for NASAL specimens     only), is one component of a     comprehensive MRSA colonization     surveillance program. It is not     intended to diagnose MRSA     infection nor to guide or     monitor treatment for     MRSA infections.     RESULT CALLED TO, READ BACK BY AND VERIFIED WITH:     Oren Bracket RN 2222 02/24/13 A BROWNING  WOUND CULTURE     Status: None   Collection Time    02/25/13  5:10 AM      Result Value Range Status   Specimen Description WOUND SACRAL   Final   Special Requests NONE   Final   Gram Stain     Final   Value: FEW WBC PRESENT, PREDOMINANTLY PMN     NO SQUAMOUS EPITHELIAL CELLS SEEN      FEW GRAM NEGATIVE RODS   Culture Culture reincubated for better growth   Final   Report Status PENDING   Incomplete     Studies:  Recent x-ray studies have been reviewed in detail by the Attending Physician  Scheduled Meds:  Scheduled Meds: . atorvastatin  10 mg Oral Daily  . Chlorhexidine Gluconate Cloth  6 each Topical Q0600  . DULoxetine  60 mg Oral Daily  . feeding supplement  30 mL Oral BID WC  . fentaNYL  75 mcg Transdermal Q72H  . folic acid  1 mg Oral Daily  . lactulose  20 g Oral QID  . lamoTRIgine  200 mg Oral BID  . mupirocin ointment  1 application Nasal BID  . piperacillin-tazobactam (ZOSYN)  IV  3.375 g Intravenous Q8H  . pregabalin  300 mg Oral TID  . sodium chloride  3 mL Intravenous Q12H  . vitamin B-12  1,000 mcg Oral Daily  . Warfarin - Pharmacist Dosing Inpatient   Does not apply q1800  . zinc sulfate  220 mg Oral Daily   Continuous Infusions:    Time spent on care of this patient: 35 minutes   Dwight Burdo, M.D.  [facility] Office  [phone] Pager - Text Page per Loretha Stapler as per below:  On-Call/Text Page:      [email or web address]      password [email or web address]  If 7PM-7AM, please contact night-coverage [email or web address] Password [facility] 02/26/2013, 11:43 AM   LOS: 2 days

## 2013-02-26 NOTE — Progress Notes (Signed)
ANTICOAGULATION / Antibiotic CONSULT NOTE - Follow Up Consult  Pharmacy Consult for Warfarin / Vancomycin Indication: VTE prophylaxis / sepsis? UTI  No Known Allergies  Labs:  Recent Labs  02/24/13 1634 02/25/13 0545 02/26/13 0400  HGB 11.4* 10.4* 9.6*  HCT 35.7* 32.2* 30.6*  PLT 367 320 276  LABPROT 39.2* 40.6* 41.3*  INR 4.25* 4.45* 4.55*  CREATININE 0.68 0.63 2.16*    Estimated Creatinine Clearance: 44.2 ml/min (by C-G formula based on Cr of 2.16).  Assessment: 57 yo M paraplegic, presented w/ AMS and concern w/ sepsis. Pt started coumadin s/p R knee plating/bone grafting (7/8). Started warfarin inpt 7.5mg  x2, with drastic elevation in INR. Held coumadin, and sent home (7/11) on 5mg  daily to be managed by SNF.   INR currently supra therapeutic and continuing to trend up. Per SNF MAR pt started septra due to UTI 7/31. Pt hgb down, and plt wnl. MD noted pts sacral ulcer has mild bleeding.  Continues on Vancomycin and Zosyn for ? Sepsis.  Now with significant change in renal function. Scr from 0.63 to 2.16?  Afebrile, cultures negative to date.  Goal of Therapy:  INR 2-3 Monitor platelets by anticoagulation protocol: Yes   Plan:  1.Continue to hold warfarin 2.Monitor s/sx of bleed 3. Daily PT/INR  4. Continue Zosyn 3.375 grams iv Q 8 hours 5. Hold vancomycin and check random level in AM  Thank you. Okey Regal, PharmD [phone]  02/26/2013,8:20 AM

## 2013-02-27 DIAGNOSIS — B1920 Unspecified viral hepatitis C without hepatic coma: Secondary | ICD-10-CM

## 2013-02-27 LAB — GLUCOSE, CAPILLARY
Glucose-Capillary: 126 mg/dL — ABNORMAL HIGH (ref 70–99)
Glucose-Capillary: 89 mg/dL (ref 70–99)
Glucose-Capillary: 97 mg/dL (ref 70–99)
Glucose-Capillary: 98 mg/dL (ref 70–99)

## 2013-02-27 LAB — BASIC METABOLIC PANEL
BUN: 29 mg/dL — ABNORMAL HIGH (ref 6–23)
GFR calc Af Amer: 18 mL/min — ABNORMAL LOW (ref >90)
GFR calc non Af Amer: 16 mL/min — ABNORMAL LOW (ref >90)
Potassium: 4 mEq/L (ref 3.5–5.1)

## 2013-02-27 LAB — WOUND CULTURE

## 2013-02-27 LAB — CBC
MCH: 25 pg — ABNORMAL LOW (ref 26.0–34.0)
Platelets: 237 10*3/uL (ref 150–400)
RBC: 3.76 MIL/uL — ABNORMAL LOW (ref 4.22–5.81)
WBC: 7.1 10*3/uL (ref 4.0–10.5)

## 2013-02-27 LAB — SODIUM, URINE, RANDOM: Sodium, Ur: 46 mEq/L

## 2013-02-27 MED ORDER — SODIUM CHLORIDE 0.9 % IV BOLUS (SEPSIS)
250.0000 mL | Freq: Once | INTRAVENOUS | Status: AC
  Administered 2013-02-27: 250 mL via INTRAVENOUS

## 2013-02-27 MED ORDER — SODIUM CHLORIDE 0.9 % IV BOLUS (SEPSIS)
500.0000 mL | Freq: Once | INTRAVENOUS | Status: AC
  Administered 2013-02-27: 500 mL via INTRAVENOUS

## 2013-02-27 MED ORDER — SODIUM CHLORIDE 0.9 % IV SOLN
INTRAVENOUS | Status: DC
  Administered 2013-02-27: 100 mL/h via INTRAVENOUS
  Administered 2013-02-27 – 2013-02-28 (×2): via INTRAVENOUS
  Administered 2013-03-02: 75 mL/h via INTRAVENOUS

## 2013-02-27 NOTE — Progress Notes (Signed)
At the start of shift it was noted that pt's foley had leaked a moderate amount of urine.  Pt was bathed, foley care was done and tubing adjusted.  From 2100 to 0320, pt's urine output was .  Elray Mcgregor NP was notified of above and a bolus was given over one hour. Will continue to monitor.

## 2013-02-27 NOTE — Progress Notes (Addendum)
[facility] Progress Note [place] TEAM 1 - Stepdown/ICU TEAM   Samuel [name] WUJ:[id number] DOB: 01-May-1956 DOA: 02/24/2013 PCP: Terald Sleeper, MD  Brief narrative: This is a 57 year old paraplegic patient who lives in a nursing home([facility]) and has a history of hypertension, decubitus ulcer followed at the wound clinic at [facility], chronic Foley catheter, history of hep C, prior alcohol and polysubstance abuse, diverging colostomy,chronic pain,left hip disarticulation with flap surgery, hyperlipidemia bipolar disorder and seizures.   He fractured his right knee 6 months ago. He underwent surgery on the lateral condyle of the right knee on 02/01/2013. He has been on DVT prophylaxis Coumadin since  Patient was brought to the hospital with altered mental status and noted to have a fever of 101.6.  Assessment/Plan: Principal Problem: Sepsis with Fever and leukocytosis - VAnc and Zosyn started in the ER -likely due to UTI -Unfortunately due to development of acute renal failure, vancomycin was discontinued this morning -Blood cultures neg - urine cx growing multiple morphotype's and is useless - recommend 7 days of antibiotics   Active Problems:  Acute renal failure - new since admission -Likely due to ATN as granular casts are noted in the urine -Possibly do to vancomycin- get vanc level today (stopped after 6 am dose yesterday) - d/c'd oxycodone and Cymbalta on 8/2 due to renal failure -d/c lamictal and lyrica due to worsening renal function - getting even worse today- pt noted to have an exteremly poor PO intake of fluids yesterday - will give NS bolus and start infusion    Encephalopathy acute- h/o hepatic encephalopathy?? - head CT negative on admission - could have been due to fever/sepsis initially -taking Lactulose (Hep C) appropriately until this AM- refused- will try to convince but he is confused this AM  - possibly confused now due to uremia      Coagulopathy -on Coumadin for DVT prophylaxis after left femoral surgery -Will allow pharmacy to manage    Hypotension with h/o HTN (hypertension) -Takes when necessary clonidine at the facility for systolic blood pressure greater than 180 or diastolic greater than 90 -Has been placed on when necessary hydralazine here  Sacral decub Monitored by wound care as outpatient Evaluated by wound care today-does not appear to be infected-has small amount of bloody discharge possibly due to elevated INR    Bipolar disorder, unspecified -hold home meds due to renal failure    Hepatitis C    Chronic pain -On fentanyl patch, oxycodone 3 times a day when necessary, Lyrica 3 times a day routine and Cymbalta -Have discontinued oxycodone and Cymbalta, Lyrica due to acute renal failure  Paraplegia/Colostomy and chronic Foley catheter  Code Status: full code Family Communication: none Disposition Plan: follow in SDU  Consultants: none  Procedures: none  Antibiotics: Vancomycin and Zosyn 7/31>>  DVT prophylaxis: Coumadin  HPI/Subjective: Pt is confused- states he ate breakfast but breakfast tray next to him is untouched- very sleepy   Objective: Blood pressure 115/51, pulse 73, temperature 98.2 F (36.8 C), temperature source Oral, resp. rate 16, height 5\' 4"  (1.626 m), weight 116 kg (255 lb 11.7 oz), SpO2 97.00%.  Intake/Output Summary (Last 24 hours) at 02/27/13 1023 Last data filed at 02/27/13 0916  Gross per 24 hour  Intake    763 ml  Output    300 ml  Net    463 ml     Exam: General: No acute respiratory distress Lungs: Clear to auscultation bilaterally without wheezes or crackles Cardiovascular: Regular rate and  rhythm without murmur gallop or rub normal S1 and S2 Abdomen: left sided colostomy with brown stool, Nontender, nondistended, soft, bowel sounds positive, no rebound, no ascites, no appreciable mass Extremities: No significant cyanosis, clubbing, or edema  bilateral lower extremities- left stump appears clean  Data Reviewed: Basic Metabolic Panel:  Recent Labs Lab 02/24/13 1634 02/25/13 0545 02/26/13 0400 02/26/13 1039 02/27/13 0817  NA 136 140 137 135 135  K 3.9 3.6 3.8 4.0 4.0  CL 99 106 103 102 101  CO2 24 22 21 21 20   GLUCOSE 116* 119* 99 90 93  BUN 7 7 18 20  29*  CREATININE 0.68 0.63 2.16* 2.63* 3.91*  CALCIUM 8.9 8.4 8.0* 8.0* 8.0*   Liver Function Tests:  Recent Labs Lab 02/24/13 1634 02/25/13 0545  AST 20 19  ALT 10 9  ALKPHOS 150* 126*  BILITOT 0.5 0.8  PROT 7.6 6.6  ALBUMIN 3.1* 2.6*   No results found for this basename: LIPASE, AMYLASE,  in the last 168 hours  Recent Labs Lab 02/24/13 1834  AMMONIA 57   CBC:  Recent Labs Lab 02/24/13 1634 02/25/13 0545 02/26/13 0400 02/27/13 0817  WBC 17.7* 18.2* 9.7 7.1  NEUTROABS 15.8* 16.7*  --   --   HGB 11.4* 10.4* 9.6* 9.4*  HCT 35.7* 32.2* 30.6* 29.3*  MCV 79.0 78.2 79.3 77.9*  PLT 367 320 276 237   Cardiac Enzymes: No results found for this basename: CKTOTAL, CKMB, CKMBINDEX, TROPONINI,  in the last 168 hours BNP (last 3 results) No results found for this basename: PROBNP,  in the last 8760 hours CBG:  Recent Labs Lab 02/26/13 1641 02/26/13 1927 02/27/13 0004 02/27/13 0319 02/27/13 0827  GLUCAP 99 120* 97 98 91    Recent Results (from the past 240 hour(s))  CULTURE, BLOOD (ROUTINE X 2)     Status: None   Collection Time    02/24/13  4:35 PM      Result Value Range Status   Specimen Description BLOOD ARM LEFT   Final   Special Requests BOTTLES DRAWN AEROBIC AND ANAEROBIC 4CCBLUE 5CCRED   Final   Culture  Setup Time 02/25/2013 01:20   Final   Culture     Final   Value:        BLOOD CULTURE RECEIVED NO GROWTH TO DATE CULTURE WILL BE HELD FOR 5 DAYS BEFORE ISSUING A FINAL NEGATIVE REPORT   Report Status PENDING   Incomplete  CULTURE, BLOOD (ROUTINE X 2)     Status: None   Collection Time    02/24/13  4:35 PM      Result Value Range  Status   Specimen Description BLOOD ARM RIGHT   Final   Special Requests BOTTLES DRAWN AEROBIC ONLY 5CC   Final   Culture  Setup Time 02/25/2013 01:20   Final   Culture     Final   Value:        BLOOD CULTURE RECEIVED NO GROWTH TO DATE CULTURE WILL BE HELD FOR 5 DAYS BEFORE ISSUING A FINAL NEGATIVE REPORT   Report Status PENDING   Incomplete  URINE CULTURE     Status: None   Collection Time    02/24/13  5:41 PM      Result Value Range Status   Specimen Description URINE, CATHETERIZED   Final   Special Requests NONE   Final   Culture  Setup Time 02/24/2013 19:46   Final   Colony Count >=100,000 COLONIES/ML   Final  Culture     Final   Value: Multiple bacterial morphotypes present, none predominant. Suggest appropriate recollection if clinically indicated.   Report Status 02/26/2013 FINAL   Final  MRSA PCR SCREENING     Status: Abnormal   Collection Time    02/24/13  8:44 PM      Result Value Range Status   MRSA by PCR POSITIVE (*) NEGATIVE Final   Comment:            The GeneXpert MRSA Assay (FDA     approved for NASAL specimens     only), is one component of a     comprehensive MRSA colonization     surveillance program. It is not     intended to diagnose MRSA     infection nor to guide or     monitor treatment for     MRSA infections.     RESULT CALLED TO, READ BACK BY AND VERIFIED WITH:     Oren Bracket RN 2222 02/24/13 A BROWNING  WOUND CULTURE     Status: None   Collection Time    02/25/13  5:10 AM      Result Value Range Status   Specimen Description WOUND SACRAL   Final   Special Requests NONE   Final   Gram Stain     Final   Value: FEW WBC PRESENT, PREDOMINANTLY PMN     NO SQUAMOUS EPITHELIAL CELLS SEEN     FEW GRAM NEGATIVE RODS   Culture     Final   Value: MULTIPLE ORGANISMS PRESENT, NONE PREDOMINANT NO STAPHYLOCOCCUS AUREUS ISOLATED NO GROUP A STREP (S.PYOGENES) ISOLATED   Report Status 02/27/2013 FINAL   Final     Studies:  Recent x-ray studies have been  reviewed in detail by the Attending Physician  Scheduled Meds:  Scheduled Meds: . atorvastatin  10 mg Oral Daily  . Chlorhexidine Gluconate Cloth  6 each Topical Q0600  . feeding supplement  30 mL Oral BID WC  . fentaNYL  75 mcg Transdermal Q72H  . folic acid  1 mg Oral Daily  . lactulose  20 g Oral QID  . lamoTRIgine  200 mg Oral BID  . mupirocin ointment  1 application Nasal BID  . piperacillin-tazobactam (ZOSYN)  IV  3.375 g Intravenous Q8H  . pregabalin  300 mg Oral TID  . sodium chloride  500 mL Intravenous Once  . sodium chloride  3 mL Intravenous Q12H  . vitamin B-12  1,000 mcg Oral Daily  . Warfarin - Pharmacist Dosing Inpatient   Does not apply q1800  . zinc sulfate  220 mg Oral Daily   Continuous Infusions: . sodium chloride      Time spent on care of this patient: 35 minutes   Yulia Ulrich, M.D.  [facility] Office  [phone] Pager - Text Page per Loretha Stapler as per below:  On-Call/Text Page:      [email or web address]      password [email or web address]  If 7PM-7AM, please contact night-coverage [email or web address] Password [email or web address] 02/27/2013, 10:23 AM   LOS: 3 days

## 2013-02-27 NOTE — Progress Notes (Addendum)
ANTICOAGULATION / Antibiotic CONSULT NOTE - Follow Up Consult  Pharmacy Consult for Warfarin / Zosyn Indication: VTE prophylaxis / sepsis? UTI  No Known Allergies  Labs:  Recent Labs  02/25/13 0545 02/26/13 0400 02/26/13 1039 02/27/13 0540 02/27/13 0817  HGB 10.4* 9.6*  --   --  9.4*  HCT 32.2* 30.6*  --   --  29.3*  PLT 320 276  --   --  237  LABPROT 40.6* 41.3*  --  41.1*  --   INR 4.45* 4.55*  --  4.52*  --   CREATININE 0.63 2.16* 2.63*  --  3.91*    Estimated Creatinine Clearance: 24.7 ml/min (by C-G formula based on Cr of 3.91).  Assessment: 57 yo M paraplegic, presented w/ AMS and concern w/ sepsis. Pt started coumadin s/p R knee plating/bone grafting (7/8). Started warfarin inpt 7.5mg  x2, with drastic elevation in INR. Held coumadin, and sent home (7/11) on 5mg  daily to be managed by SNF.   INR currently supra therapeutic and continuing to trend up. Per SNF MAR pt started septra due to UTI 7/31. Pt hgb down, and plt wnl. MD noted pts sacral ulcer has mild bleeding.  Continues on Zosyn for ? Sepsis.  Now with significant change in renal function. Scr from 0.63 to 2.16 to 2.63 today.  Vancomycin discontinued yesterday, random level still elevated at 35.7,  Afebrile, cultures negative to date.  Goal of Therapy:  INR 2-3 Monitor platelets by anticoagulation protocol: Yes   Plan:  1. Continue to hold warfarin 2. Monitor s/sx of bleed 3. Daily PT/INR  4. Continue Zosyn 3.375 grams iv Q 8 hours 5. Vancomycin level still elevated --vancomycin stopped 8/2  Thank you. Okey Regal, PharmD [phone]  02/27/2013,8:59 AM

## 2013-02-28 ENCOUNTER — Inpatient Hospital Stay (HOSPITAL_COMMUNITY): Payer: Medicare Other

## 2013-02-28 LAB — PROTIME-INR: INR: 3.08 — ABNORMAL HIGH (ref 0.00–1.49)

## 2013-02-28 LAB — BASIC METABOLIC PANEL
BUN: 35 mg/dL — ABNORMAL HIGH (ref 6–23)
Chloride: 105 mEq/L (ref 96–112)
GFR calc Af Amer: 15 mL/min — ABNORMAL LOW (ref >90)
Potassium: 3.8 mEq/L (ref 3.5–5.1)

## 2013-02-28 LAB — GLUCOSE, CAPILLARY
Glucose-Capillary: 100 mg/dL — ABNORMAL HIGH (ref 70–99)
Glucose-Capillary: 93 mg/dL (ref 70–99)

## 2013-02-28 MED ORDER — SODIUM BICARBONATE 650 MG PO TABS
650.0000 mg | ORAL_TABLET | Freq: Three times a day (TID) | ORAL | Status: DC
  Administered 2013-02-28 – 2013-03-04 (×12): 650 mg via ORAL
  Filled 2013-02-28 (×14): qty 1

## 2013-02-28 NOTE — Clinical Social Work Psychosocial (Signed)
Clinical Social Work Department BRIEF PSYCHOSOCIAL ASSESSMENT 02/28/2013  Patient:  MECCA, BARGA     Account Number:  [id number]     Admit date:  02/24/2013  Clinical Social Worker:  Read Drivers  Date/Time:  02/28/2013 12:03 PM  Referred by:  Physician  Date Referred:  02/28/2013 Referred for  SNF Placement   Other Referral:   Pt from [place], SNF   Interview type:  Other - See comment Other interview type:   Pt and Sports administrator, Angela    PSYCHOSOCIAL DATA Living Status:  FACILITY Admitted from facility:  [facility] Level of care:  Skilled Nursing Facility Primary support name:  Herbert Seta and Annabelle Harman Primary support relationship to patient:  CHILD, ADULT Degree of support available:   adequate    CURRENT CONCERNS Current Concerns  Post-Acute Placement   Other Concerns:   none    SOCIAL WORK ASSESSMENT / PLAN CSW was notified that pt was from [place], [place]. Representative was at the hospital visiting pt.  Pt will d/c back to SNF when medically appropriate.  Pt being sent for ultrasound.  Pt and facility agreeable to pt return.   Assessment/plan status:  Psychosocial Support/Ongoing Assessment of Needs Other assessment/ plan:     none   Information/referral to community resources:   SNF    PATIENT'S/FAMILY'S RESPONSE TO PLAN OF CARE: Pt was agreeable and apprciative of CSW support     Vickii Penna, [place] [phone]  Clinical Social Work
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
## 2013-02-28 NOTE — Progress Notes (Signed)
[facility] Progress Note [place] TEAM 1 - Stepdown/ICU TEAM   Samuel [name] ZOX:[id number] DOB: 1955/10/16 DOA: 02/24/2013 PCP: Terald Sleeper, MD  Brief narrative: 57 year old paraplegic patient who lives in a nursing home([facility]) and has a history of hypertension, decubitus ulcer followed at the wound clinic at [facility], chronic Foley catheter, history of hep C, prior alcohol and polysubstance abuse, diverging colostomy, chronic pain, left hip disarticulation with flap surgery, hyperlipidemia, bipolar disorder and seizures.   He fractured his right knee 6 months ago. He underwent surgery on the lateral condyle of the right knee on 02/01/2013. He has been on DVT prophylaxis Coumadin since  Patient was brought to the hospital with altered mental status and noted to have a fever of 101.6.  Assessment/Plan:  Sepsis with Fever and leukocytosis - Vanc and Zosyn started in the ER for empiric tx of UTI -Due to development of acute renal failure, vancomycin was discontinued 8/4 -Blood cultures neg and urine cx growing multiple morphotype's  - Initially recommend 7 days of antibiotics but since renal function worsening and WBC normal/afebrile will go ahead an dc all anbx's (D # 5)  Acute renal failure/ATN - new since admission -Likely due to ATN as granular casts are noted in the urine -Possibly due to vancomycin which was dc'd 8/3 -Urine Na+ >40 so not hypovolemic -Renal consulted 8/4 - d/c'd oxycodone and Cymbalta on 8/2 due to renal failure -d/c lamictal and lyrica due to worsening renal function    Encephalopathy acute- h/o hepatic encephalopathy?? - head CT negative on admission - could have been due to fever/sepsis initially -has been refusing Lactulose this admission-check ammonia level - possibly confused now due to uremia/decreased renal clearance of meds  so- d/c'd oxycodone and Cymbalta on 8/2 due to renal failure and d/c'd lamictal and lyrica due to  worsening renal function    Coagulopathy -on Coumadin for DVT prophylaxis after left femoral surgery -pharmacy to manage    Hypotension with h/o HTN  -Takes when necessary clonidine at the facility for systolic blood pressure greater than 180 or diastolic greater than 90 -Has been placed on when necessary hydralazine here  Sacral decub Monitored by wound care as outpatient Evaluated by wound care today-does not appear to be infected-has small amount of bloody discharge possibly due to elevated INR    Bipolar disorder, unspecified -hold home meds due to renal failure    Hepatitis C    Chronic pain -On fentanyl patch, oxycodone 3 times a day when necessary, Lyrica 3 times a day routine and Cymbalta preadmit -Have discontinued oxycodone and Cymbalta, Lyrica due to acute renal failure (see above)  Paraplegia/Colostomy and chronic Foley catheter  Code Status: full code Family Communication: none Disposition Plan: follow in SDU  Consultants: Nephrology  Procedures: none  Antibiotics: Vancomycin 7/31 >>> 8/3 Zosyn 7/31>>> 8/4  DVT prophylaxis: Coumadin  HPI/Subjective: Pt is confused as to location- states is in [facility]  Unable to provide reliable hx.  Objective: Blood pressure 167/134, pulse 66, temperature 98.1 F (36.7 C), temperature source Oral, resp. rate 20, height 5\' 4"  (1.626 m), weight 116 kg (255 lb 11.7 oz), SpO2 98.00%.  Intake/Output Summary (Last 24 hours) at 02/28/13 1245 Last data filed at 02/28/13 1100  Gross per 24 hour  Intake    776 ml  Output   1550 ml  Net   -774 ml   Exam: General: No acute respiratory distress Lungs: Clear to auscultation bilaterally without wheezes or crackles Cardiovascular: Regular rate and  rhythm without murmur gallop or rub normal S1 and S2 Abdomen: left sided colostomy with brown stool, nontender, nondistended, soft, bowel sounds positive, no rebound, no ascites, no appreciable mass - very large old midline  scar Genitourinary: Foley with clear yellow urine Extremities: No significant cyanosis, clubbing, or edema bilateral lower extremities- left stump appears clean  Data Reviewed: Basic Metabolic Panel:  Recent Labs Lab 02/25/13 0545 02/26/13 0400 02/26/13 1039 02/27/13 0817 02/28/13 0430  NA 140 137 135 135 138  K 3.6 3.8 4.0 4.0 3.8  CL 106 103 102 101 105  CO2 22 21 21 20  17*  GLUCOSE 119* 99 90 93 92  BUN 7 18 20  29* 35*  CREATININE 0.63 2.16* 2.63* 3.91* 4.69*  CALCIUM 8.4 8.0* 8.0* 8.0* 7.8*   Liver Function Tests:  Recent Labs Lab 02/24/13 1634 02/25/13 0545  AST 20 19  ALT 10 9  ALKPHOS 150* 126*  BILITOT 0.5 0.8  PROT 7.6 6.6  ALBUMIN 3.1* 2.6*    Recent Labs Lab 02/24/13 1834  AMMONIA 57   CBC:  Recent Labs Lab 02/24/13 1634 02/25/13 0545 02/26/13 0400 02/27/13 0817  WBC 17.7* 18.2* 9.7 7.1  NEUTROABS 15.8* 16.7*  --   --   HGB 11.4* 10.4* 9.6* 9.4*  HCT 35.7* 32.2* 30.6* 29.3*  MCV 79.0 78.2 79.3 77.9*  PLT 367 320 276 237   CBG:  Recent Labs Lab 02/27/13 1308 02/27/13 1707 02/27/13 1953 02/28/13 0720 02/28/13 1209  GLUCAP 105* 89 126* 100* 93    Recent Results (from the past 240 hour(s))  CULTURE, BLOOD (ROUTINE X 2)     Status: None   Collection Time    02/24/13  4:35 PM      Result Value Range Status   Specimen Description BLOOD ARM LEFT   Final   Special Requests BOTTLES DRAWN AEROBIC AND ANAEROBIC 4CCBLUE 5CCRED   Final   Culture  Setup Time 02/25/2013 01:20   Final   Culture     Final   Value:        BLOOD CULTURE RECEIVED NO GROWTH TO DATE CULTURE WILL BE HELD FOR 5 DAYS BEFORE ISSUING A FINAL NEGATIVE REPORT   Report Status PENDING   Incomplete  CULTURE, BLOOD (ROUTINE X 2)     Status: None   Collection Time    02/24/13  4:35 PM      Result Value Range Status   Specimen Description BLOOD ARM RIGHT   Final   Special Requests BOTTLES DRAWN AEROBIC ONLY 5CC   Final   Culture  Setup Time 02/25/2013 01:20   Final    Culture     Final   Value:        BLOOD CULTURE RECEIVED NO GROWTH TO DATE CULTURE WILL BE HELD FOR 5 DAYS BEFORE ISSUING A FINAL NEGATIVE REPORT   Report Status PENDING   Incomplete  URINE CULTURE     Status: None   Collection Time    02/24/13  5:41 PM      Result Value Range Status   Specimen Description URINE, CATHETERIZED   Final   Special Requests NONE   Final   Culture  Setup Time 02/24/2013 19:46   Final   Colony Count >=100,000 COLONIES/ML   Final   Culture     Final   Value: Multiple bacterial morphotypes present, none predominant. Suggest appropriate recollection if clinically indicated.   Report Status 02/26/2013 FINAL   Final  MRSA PCR SCREENING  Status: Abnormal   Collection Time    02/24/13  8:44 PM      Result Value Range Status   MRSA by PCR POSITIVE (*) NEGATIVE Final   Comment:            The GeneXpert MRSA Assay (FDA     approved for NASAL specimens     only), is one component of a     comprehensive MRSA colonization     surveillance program. It is not     intended to diagnose MRSA     infection nor to guide or     monitor treatment for     MRSA infections.     RESULT CALLED TO, READ BACK BY AND VERIFIED WITH:     Oren Bracket RN [id number] 02/24/13 A BROWNING  WOUND CULTURE     Status: None   Collection Time    02/25/13  5:10 AM      Result Value Range Status   Specimen Description WOUND SACRAL   Final   Special Requests NONE   Final   Gram Stain     Final   Value: FEW WBC PRESENT, PREDOMINANTLY PMN     NO SQUAMOUS EPITHELIAL CELLS SEEN     FEW GRAM NEGATIVE RODS   Culture     Final   Value: MULTIPLE ORGANISMS PRESENT, NONE PREDOMINANT NO STAPHYLOCOCCUS AUREUS ISOLATED NO GROUP A STREP (S.PYOGENES) ISOLATED   Report Status 02/27/2013 FINAL   Final     Studies:  Recent x-ray studies have been reviewed in detail by the Attending Physician  Scheduled Meds:  Scheduled Meds: . atorvastatin  10 mg Oral Daily  . Chlorhexidine Gluconate Cloth  6 each Topical  Q0600  . feeding supplement  30 mL Oral BID WC  . fentaNYL  75 mcg Transdermal Q72H  . folic acid  1 mg Oral Daily  . lactulose  20 g Oral QID  . mupirocin ointment  1 application Nasal BID  . sodium chloride  3 mL Intravenous Q12H  . vitamin B-12  1,000 mcg Oral Daily  . Warfarin - Pharmacist Dosing Inpatient   Does not apply q1800  . zinc sulfate  220 mg Oral Daily    Time spent on care of this patient: 35 minutes   ELLIS,ALLISON L., ANP.  [facility] Office  [phone] Pager - Text Page per Loretha Stapler as per below:  On-Call/Text Page:      [email or web address]      password [email or web address]  If 7PM-7AM, please contact night-coverage [email or web address] Password [email or web address] 02/28/2013, 12:45 PM   LOS: 4 days   I have personally examined this patient and reviewed the entire database. I have reviewed the above note, made any necessary editorial changes, and agree with its content.  Lonia Blood, MD [facility]

## 2013-02-28 NOTE — Progress Notes (Signed)
ANTICOAGULATION CONSULT NOTE - Follow Up Consult  Pharmacy Consult for Warfarin Indication: VTE prophylaxis  No Known Allergies  Labs:  Recent Labs  02/26/13 0400 02/26/13 1039 02/27/13 0540 02/27/13 0817 02/28/13 0430  HGB 9.6*  --   --  9.4*  --   HCT 30.6*  --   --  29.3*  --   PLT 276  --   --  237  --   LABPROT 41.3*  --  41.1*  --  30.7*  INR 4.55*  --  4.52*  --  3.08*  CREATININE 2.16* 2.63*  --  3.91* 4.69*    Estimated Creatinine Clearance: 20.6 ml/min (by C-G formula based on Cr of 4.69).  Assessment: 57 yo M paraplegic, presented w/ AMS and concern w/ sepsis. Pt started coumadin s/p R knee plating/bone grafting (7/8). Started warfarin inpt 7.5mg  x2, with drastic elevation in INR. Held coumadin, and sent home (7/11) on 5mg  daily to be managed by SNF.   INR currently supra therapeutic and now trending down, likely d/t drug interaction with Bactrim previously.  Goal of Therapy:  INR 2-3 Monitor platelets by anticoagulation protocol: Yes   Plan:  1. Continue to hold warfarin and f/u am INR  Verlene Mayer, PharmD, BCPS Pager [phone] 02/28/2013,10:59 AM

## 2013-02-28 NOTE — ED Provider Notes (Signed)
I have personally seen and examined the patient.  I have discussed the plan of care with the resident.  I have reviewed the documentation on PMH/FH/Soc. History.  I have reviewed the documentation of the resident and agree.  Doug Sou, MD 02/28/13 2356

## 2013-03-01 ENCOUNTER — Inpatient Hospital Stay (HOSPITAL_COMMUNITY): Payer: Medicare Other

## 2013-03-01 LAB — GLUCOSE, CAPILLARY
Glucose-Capillary: 102 mg/dL — ABNORMAL HIGH (ref 70–99)
Glucose-Capillary: 98 mg/dL (ref 70–99)

## 2013-03-01 LAB — CBC
MCH: 25.1 pg — ABNORMAL LOW (ref 26.0–34.0)
MCV: 75.2 fL — ABNORMAL LOW (ref 78.0–100.0)
Platelets: 217 10*3/uL (ref 150–400)
RDW: 16.5 % — ABNORMAL HIGH (ref 11.5–15.5)

## 2013-03-01 LAB — BASIC METABOLIC PANEL
BUN: 40 mg/dL — ABNORMAL HIGH (ref 6–23)
CO2: 17 mEq/L — ABNORMAL LOW (ref 19–32)
Calcium: 8.1 mg/dL — ABNORMAL LOW (ref 8.4–10.5)
Creatinine, Ser: 5.47 mg/dL — ABNORMAL HIGH (ref 0.50–1.35)
Glucose, Bld: 98 mg/dL (ref 70–99)

## 2013-03-01 LAB — RHEUMATOID FACTOR: Rhuematoid fact SerPl-aCnc: <10 IU/mL (ref <=14)

## 2013-03-01 MED ORDER — WARFARIN SODIUM 2 MG PO TABS
2.0000 mg | ORAL_TABLET | Freq: Once | ORAL | Status: AC
  Administered 2013-03-01: 2 mg via ORAL
  Filled 2013-03-01: qty 1

## 2013-03-01 NOTE — Consult Note (Signed)
WOC follow-up: Wound to buttocks/sacrum area unchanged from previous assessment.  Beefy red wound bed, mod amt red drainage, some odor.  Continue present plan of care with moist gauze packing. Please re-consult if further assistance is needed.  Thank-you,  Cammie Mcgee MSN, RN, CWOCN, [place], CNS [phone]

## 2013-03-01 NOTE — Progress Notes (Signed)
[facility] Progress Note [place] TEAM 1 - Stepdown/ICU TEAM   Samuel [name] HQI:[id number] DOB: [birth year] DOA: 02/24/2013 PCP: Terald Sleeper, MD  Brief narrative: 57 year old paraplegic patient who lives in a nursing home([facility]) and has a history of hypertension, decubitus ulcer followed at the wound clinic at [facility], chronic Foley catheter, history of hep C, prior alcohol and polysubstance abuse, diverging colostomy, chronic pain, left hip disarticulation with flap surgery, hyperlipidemia, bipolar disorder and seizures.   He fractured his right knee 6 months ago. He underwent surgery on the lateral condyle of the right knee on 02/01/2013. He has been on DVT prophylaxis Coumadin since  Patient was brought to the hospital with altered mental status and noted to have a fever of 101.6.  Assessment/Plan:  Sepsis with Fever and leukocytosis - Vanc and Zosyn started in the ER for empiric tx of UTI -Due to development of acute renal failure, vancomycin was discontinued 8/4 -Blood cultures neg and urine cx growing multiple morphotype's  - Initially recommended 7 days of antibiotics but since renal function worsening/ WBC normal/afebrile will go ahead an dc all anbx's (D # 5)  Acute renal failure/ATN - new since admission-continues to worsen but still nonoliguric -Likely due to ATN as granular casts are noted in the urine -Likely due to vancomycin which was dc'd 8/3- vanc level 24 hrs later was still elevated at 35.7 -Urine Na+ >40 so not hypovolemic -Renal consulted 8/4-appreciate assistance - d/c'd oxycodone and Cymbalta on 8/2 due to renal failure -d/c lamictal and lyrica due to worsening renal function -Nephro added oral bicarb 8/4   Acute Respiratory Failure with hypoxia -new left basilar crackles -CXR - pulm vascular congestion, and infiltrate in LLL- weight up by 4 kg since admission-  -add IS and flutter valve    Encephalopathy acute- h/o hepatic  encephalopathy?? - head CT negative on admission - could have been due to fever/sepsis initially -has been refusing Lactulose this admission-ammonia level 29 (8/4) - possibly confused now due to uremia/decreased renal clearance of meds  so- d/c'd oxycodone and Cymbalta on 8/2 due to renal failure and d/c'd lamictal and lyrica due to worsening renal function    Coagulopathy -on Coumadin for DVT prophylaxis after left femoral surgery -pharmacy to manage    Hypotension with h/o HTN  -Takes when necessary clonidine at the facility for systolic blood pressure greater than 180 or diastolic greater than 90 -Has been placed on when necessary hydralazine here  Sacral decub -Monitored by wound care as outpatient -Evaluated by wound care today-does not appear to be infected-has small amount of bloody discharge possibly due to elevated INR    Bipolar disorder, unspecified -hold home meds due to renal failure    Hepatitis C -stable     Chronic pain -On fentanyl patch, oxycodone 3 times a day when necessary, Lyrica 3 times a day routine and Cymbalta preadmit -Have discontinued oxycodone and Cymbalta, Lyrica due to acute renal failure (see above)  Paraplegia/Colostomy and chronic Foley catheter -as above re: [place] and cx  -pt requests begin PT/OT-says usually OOB to chair regularly at facility  Code Status: full code Family Communication: none Disposition Plan: follow in SDU due to progressive renal failure  Consultants: Nephrology  Procedures: none  Antibiotics: Vancomycin 7/31 >>> 8/3 Zosyn 7/31>>> 8/4  DVT prophylaxis: Coumadin  HPI/Subjective: Pt is confused as to location- states is in [facility].  Unable to provide reliable hx.  Objective: Blood pressure 170/89, pulse 75, temperature 97.3 F (36.3 C), temperature  source Oral, resp. rate 13, height 5\' 4"  (1.626 m), weight 116 kg (255 lb 11.7 oz), SpO2 98.00%.  Intake/Output Summary (Last 24 hours) at 03/01/13  1044 Last data filed at 03/01/13 0854  Gross per 24 hour  Intake 3432.67 ml  Output   1550 ml  Net 1882.67 ml   Exam: General: No acute respiratory distress Lungs: Clear to auscultation bilaterally without wheezes or crackles Cardiovascular: Regular rate and rhythm without murmur gallop or rub normal S1 and S2 Abdomen: left sided colostomy with brown stool, nontender, nondistended, soft, bowel sounds positive, no rebound, no ascites, no appreciable mass - very large old midline scar Genitourinary: Foley with clear yellow urine Extremities: No significant cyanosis, clubbing, or edema bilateral lower extremities- left stump appears clean  Data Reviewed: Basic Metabolic Panel:  Recent Labs Lab 02/26/13 0400 02/26/13 1039 02/27/13 0817 02/28/13 0430 03/01/13 0414  NA 137 135 135 138 137  K 3.8 4.0 4.0 3.8 3.4*  CL 103 102 101 105 107  CO2 21 21 20  17* 17*  GLUCOSE 99 90 93 92 98  BUN 18 20 29* 35* 40*  CREATININE 2.16* 2.63* 3.91* 4.69* 5.47*  CALCIUM 8.0* 8.0* 8.0* 7.8* 8.1*   Liver Function Tests:  Recent Labs Lab 02/24/13 1634 02/25/13 0545  AST 20 19  ALT 10 9  ALKPHOS 150* 126*  BILITOT 0.5 0.8  PROT 7.6 6.6  ALBUMIN 3.1* 2.6*    Recent Labs Lab 02/24/13 1834 02/28/13 1438  AMMONIA 57 29   CBC:  Recent Labs Lab 02/24/13 1634 02/25/13 0545 02/26/13 0400 02/27/13 0817 03/01/13 0414  WBC 17.7* 18.2* 9.7 7.1 7.2  NEUTROABS 15.8* 16.7*  --   --   --   HGB 11.4* 10.4* 9.6* 9.4* 8.9*  HCT 35.7* 32.2* 30.6* 29.3* 26.7*  MCV 79.0 78.2 79.3 77.9* 75.2*  PLT 367 320 276 237 217   CBG:  Recent Labs Lab 02/27/13 1953 02/28/13 0720 02/28/13 1209 02/28/13 1631 02/28/13 2121  GLUCAP 126* 100* 93 107* 146*    Recent Results (from the past 240 hour(s))  CULTURE, BLOOD (ROUTINE X 2)     Status: None   Collection Time    02/24/13  4:35 PM      Result Value Range Status   Specimen Description BLOOD ARM LEFT   Final   Special Requests BOTTLES DRAWN  AEROBIC AND ANAEROBIC 4CCBLUE 5CCRED   Final   Culture  Setup Time 02/25/2013 01:20   Final   Culture     Final   Value:        BLOOD CULTURE RECEIVED NO GROWTH TO DATE CULTURE WILL BE HELD FOR 5 DAYS BEFORE ISSUING A FINAL NEGATIVE REPORT   Report Status PENDING   Incomplete  CULTURE, BLOOD (ROUTINE X 2)     Status: None   Collection Time    02/24/13  4:35 PM      Result Value Range Status   Specimen Description BLOOD ARM RIGHT   Final   Special Requests BOTTLES DRAWN AEROBIC ONLY 5CC   Final   Culture  Setup Time 02/25/2013 01:20   Final   Culture     Final   Value:        BLOOD CULTURE RECEIVED NO GROWTH TO DATE CULTURE WILL BE HELD FOR 5 DAYS BEFORE ISSUING A FINAL NEGATIVE REPORT   Report Status PENDING   Incomplete  URINE CULTURE     Status: None   Collection Time    02/24/13  5:41 PM      Result Value Range Status   Specimen Description URINE, CATHETERIZED   Final   Special Requests NONE   Final   Culture  Setup Time 02/24/2013 19:46   Final   Colony Count >=100,000 COLONIES/ML   Final   Culture     Final   Value: Multiple bacterial morphotypes present, none predominant. Suggest appropriate recollection if clinically indicated.   Report Status 02/26/2013 FINAL   Final  MRSA PCR SCREENING     Status: Abnormal   Collection Time    02/24/13  8:44 PM      Result Value Range Status   MRSA by PCR POSITIVE (*) NEGATIVE Final   Comment:            The GeneXpert MRSA Assay (FDA     approved for NASAL specimens     only), is one component of a     comprehensive MRSA colonization     surveillance program. It is not     intended to diagnose MRSA     infection nor to guide or     monitor treatment for     MRSA infections.     RESULT CALLED TO, READ BACK BY AND VERIFIED WITH:     Oren Bracket RN 2222 02/24/13 A BROWNING  WOUND CULTURE     Status: None   Collection Time    02/25/13  5:10 AM      Result Value Range Status   Specimen Description WOUND SACRAL   Final   Special Requests  NONE   Final   Gram Stain     Final   Value: FEW WBC PRESENT, PREDOMINANTLY PMN     NO SQUAMOUS EPITHELIAL CELLS SEEN     FEW GRAM NEGATIVE RODS   Culture     Final   Value: MULTIPLE ORGANISMS PRESENT, NONE PREDOMINANT NO STAPHYLOCOCCUS AUREUS ISOLATED NO GROUP A STREP (S.PYOGENES) ISOLATED   Report Status 02/27/2013 FINAL   Final     Studies:  Recent x-ray studies have been reviewed in detail by the Attending Physician  Scheduled Meds:  Scheduled Meds: . atorvastatin  10 mg Oral Daily  . Chlorhexidine Gluconate Cloth  6 each Topical Q0600  . feeding supplement  30 mL Oral BID WC  . fentaNYL  75 mcg Transdermal Q72H  . folic acid  1 mg Oral Daily  . lactulose  20 g Oral QID  . sodium bicarbonate  650 mg Oral TID  . sodium chloride  3 mL Intravenous Q12H  . vitamin B-12  1,000 mcg Oral Daily  . warfarin  2 mg Oral ONCE-1800  . Warfarin - Pharmacist Dosing Inpatient   Does not apply q1800  . zinc sulfate  220 mg Oral Daily    Time spent on care of this patient: 35 minutes   ELLIS,ALLISON L., ANP.  [facility] Office  [phone] Pager - Text Page per Loretha Stapler as per below:  On-Call/Text Page:      [email or web address]      password [email or web address]  If 7PM-7AM, please contact night-coverage [email or web address] Password [facility] 03/01/2013, 10:44 AM   LOS: 5 days     I have examined the patient, reviewed the chart and modified the above note which I agree with.   Judythe Postema,MD [phone] 03/01/2013, 4:52 PM

## 2013-03-01 NOTE — Progress Notes (Signed)
ANTICOAGULATION CONSULT NOTE - Follow Up Consult  Pharmacy Consult for Warfarin Indication: VTE prophylaxis  No Known Allergies  Labs:  Recent Labs  02/27/13 0540 02/27/13 0817 02/28/13 0430 03/01/13 0414  HGB  --  9.4*  --  8.9*  HCT  --  29.3*  --  26.7*  PLT  --  237  --  217  LABPROT 41.1*  --  30.7* 25.1*  INR 4.52*  --  3.08* 2.37*  CREATININE  --  3.91* 4.69* 5.47*    Estimated Creatinine Clearance: 17.7 ml/min (by C-G formula based on Cr of 5.47).  Assessment: 57 yo M paraplegic, presented w/ AMS and concern w/ sepsis. Pt started coumadin s/p R knee plating/bone grafting (7/8). Started warfarin inpt 7.5mg  x2, with drastic elevation in INR. Held coumadin, and sent home (7/11) on 5mg  daily to be managed by SNF.   INR currently supra therapeutic and now trending down, likely d/t drug interaction with Bactrim previously.  INR now back in therapeutic range  Goal of Therapy:  INR 2-3 Monitor platelets by anticoagulation protocol: Yes   Plan:  1. Coumadin 2 mg po x 1 dose tonight 2. Daily INR  Thank you. Okey Regal, PharmD [phone]  03/01/2013,8:29 AM

## 2013-03-01 NOTE — Evaluation (Signed)
Physical Therapy Evaluation Patient Details Name: Samuel [name] MRN: [id number] DOB: 06-19-1956 Today's Date: 03/01/2013 Time: 4098-1191 PT Time Calculation (min): 26 min  PT Assessment / Plan / Recommendation History of Present Illness  The patient is a 57 y.o. year-old male admitted 7/31 with AMS and fever thought 2/2 UTI and/or decubitus ulcer treated initially with vancomycin and pip/tazo.  SCr was 0.68 on admission and increased to 2.16 on 08/02.    Clinical Impression  Suspect pt functioning near baseline however with increased confusion. Pt remains appropriate for SNF due to patient with sacral wound and pt requiring total assist for all mobility and ADLs. SNF to address mentioned deficits and achieve maximal recovery.    PT Assessment  Patient needs continued PT services    Follow Up Recommendations  SNF;Supervision/Assistance - 24 hour    Does the patient have the potential to tolerate intense rehabilitation      Barriers to Discharge        Equipment Recommendations  None recommended by PT    Recommendations for Other Services     Frequency Min 2X/week    Precautions / Restrictions Precautions Precautions: Fall Precaution Comments: pt with sacral decub and is a paraplegic Restrictions Weight Bearing Restrictions: No   Pertinent Vitals/Pain Pt did not report of pain      Mobility  Bed Mobility Bed Mobility: Rolling Right;Right Sidelying to Sit;Sit to Sidelying Right Rolling Right: 1: +2 Total assist;With rail Rolling Right: Patient Percentage: 20% Right Sidelying to Sit: 1: +2 Total assist;With rails Right Sidelying to Sit: Patient Percentage: 10% Sit to Sidelying Right: 1: +2 Total assist;With rail Sit to Sidelying Right: Patient Percentage: 10% Details for Bed Mobility Assistance: pt able to intiate but no physically assist Transfers Transfers: Not assessed Ambulation/Gait Ambulation/Gait Assistance: Other (comment)    Exercises     PT Diagnosis:  Altered mental status;Generalized weakness  PT Problem List: Decreased activity tolerance;Decreased mobility;Decreased cognition;Decreased knowledge of precautions;Decreased safety awareness;Decreased knowledge of use of DME;Obesity;Pain;Decreased skin integrity;Impaired sensation PT Treatment Interventions: DME instruction;Functional mobility training;Therapeutic activities;Therapeutic exercise;Balance training;Cognitive remediation;Patient/family education     PT Goals(Current goals can be found in the care plan section) Acute Rehab PT Goals PT Goal Formulation: With patient Time For Goal Achievement: 03/15/13 Potential to Achieve Goals: Fair  Visit Information  Last PT Received On: 03/01/13 Assistance Needed: +2 History of Present Illness: The patient is a 57 y.o. year-old male admitted 7/31 with AMS and fever thought 2/2 UTI and/or decubitus ulcer treated initially with vancomycin and pip/tazo.  SCr was 0.68 on admission and increased to 2.16 on 08/02.         Prior Functioning  Home Living Family/patient expects to be discharged to:: Skilled nursing facility (pt resided at [facility]) Prior Function Level of Independence: Needs assistance Gait / Transfers Assistance Needed: pt confused but reports he use to "sling" himself into his w/c however pt with recent R LE fx and was/is NWB ADL's / Homemaking Assistance Needed: total assist Communication Communication:  (garbled speech) Dominant Hand: Right    Cognition  Cognition Arousal/Alertness: Awake/alert Behavior During Therapy: WFL for tasks assessed/performed Overall Cognitive Status: Impaired/Different from baseline Area of Impairment: Orientation;Attention;Safety/judgement;Problem solving Orientation Level: Place;Situation Current Attention Level: Focused Memory: Decreased short-term memory Following Commands: Follows one step commands inconsistently Safety/Judgement: Decreased awareness of safety Problem Solving: Slow  processing;Difficulty sequencing;Requires verbal cues;Requires tactile cues;Decreased initiation    Extremity/Trunk Assessment Upper Extremity Assessment Upper Extremity Assessment: Generalized weakness Lower Extremity Assessment Lower Extremity Assessment: RLE  deficits/detail;LLE deficits/detail RLE Deficits / Details: pt with no voluntary mvmt LLE Deficits / Details: AKA Cervical / Trunk Assessment Cervical / Trunk Assessment: Normal   Balance Balance Balance Assessed: Yes Static Sitting Balance Static Sitting - Balance Support: Bilateral upper extremity supported Static Sitting - Level of Assistance: 1: +1 Total assist Static Sitting - Comment/# of Minutes: 10 min - worked on finding center of gravity. complete forward push ups, reaching with L UE/R UE. pt with strong desire to push posteriorly requiring max v/c's not to by end of session pt able to maintain balance with bilat UE with minA and max v/c's  End of Session PT - End of Session Activity Tolerance: Patient tolerated treatment well Patient left: in bed;with call bell/phone within reach Nurse Communication: Mobility status  GP     Marcene Brawn 03/01/2013, 4:25 PM  Lewis Shock, PT, DPT Pager #: [phone] Office #: [phone]

## 2013-03-02 LAB — RENAL FUNCTION PANEL
Albumin: 2.2 g/dL — ABNORMAL LOW (ref 3.5–5.2)
CO2: 17 mEq/L — ABNORMAL LOW (ref 19–32)
Calcium: 8.3 mg/dL — ABNORMAL LOW (ref 8.4–10.5)
Chloride: 106 mEq/L (ref 96–112)
Creatinine, Ser: 5.24 mg/dL — ABNORMAL HIGH (ref 0.50–1.35)
GFR calc Af Amer: 13 mL/min — ABNORMAL LOW (ref >90)
GFR calc non Af Amer: 11 mL/min — ABNORMAL LOW (ref >90)
Sodium: 139 mEq/L (ref 135–145)

## 2013-03-02 LAB — GLUCOSE, CAPILLARY
Glucose-Capillary: 91 mg/dL (ref 70–99)
Glucose-Capillary: 115 mg/dL — ABNORMAL HIGH (ref 70–99)

## 2013-03-02 MED ORDER — IBUPROFEN 600 MG PO TABS
600.0000 mg | ORAL_TABLET | Freq: Once | ORAL | Status: AC
  Administered 2013-03-02: 600 mg via ORAL
  Filled 2013-03-02 (×3): qty 1

## 2013-03-02 MED ORDER — OXYCODONE HCL 5 MG PO TABS: 10.0000 mg | ORAL_TABLET | Freq: Three times a day (TID) | ORAL | Status: DC | PRN

## 2013-03-02 MED ORDER — LAMOTRIGINE 100 MG PO TABS
100.0000 mg | ORAL_TABLET | Freq: Two times a day (BID) | ORAL | Status: DC
Start: 2013-03-02 — End: 2013-03-04
  Administered 2013-03-02 – 2013-03-04 (×5): 100 mg via ORAL
  Filled 2013-03-02 (×7): qty 1

## 2013-03-02 MED ORDER — WARFARIN SODIUM 2 MG PO TABS
2.0000 mg | ORAL_TABLET | Freq: Once | ORAL | Status: AC
  Administered 2013-03-02: 2 mg via ORAL
  Filled 2013-03-02: qty 1

## 2013-03-02 MED ORDER — ONDANSETRON 4 MG PO TBDP
4.0000 mg | ORAL_TABLET | Freq: Three times a day (TID) | ORAL | Status: DC | PRN
  Administered 2013-03-02: 4 mg via ORAL
  Filled 2013-03-02: qty 1

## 2013-03-02 MED ORDER — PROMETHAZINE HCL 25 MG RE SUPP
25.0000 mg | Freq: Four times a day (QID) | RECTAL | Status: DC | PRN
  Filled 2013-03-02: qty 1

## 2013-03-02 MED ORDER — ACETAMINOPHEN 325 MG PO TABS
650.0000 mg | ORAL_TABLET | Freq: Four times a day (QID) | ORAL | Status: DC | PRN
  Administered 2013-03-02: 650 mg via ORAL
  Filled 2013-03-02: qty 2

## 2013-03-02 NOTE — Progress Notes (Signed)
Utilization review completed.  

## 2013-03-02 NOTE — Clinical Social Work Note (Signed)
CSW reviewed chart and pt information with RNCM.  CSW updated [place], [place].  CSW continuing to follow.  Vickii Penna, LCSWA [phone]  Clinical Social Work

## 2013-03-02 NOTE — Evaluation (Signed)
Occupational Therapy Evaluation Patient Details Name: Samuel [name] MRN: [id number] DOB: [birth year] Today's Date: 03/02/2013 Time: 6213-0865 OT Time Calculation (min): 16 min  OT Assessment / Plan / Recommendation History of present illness The patient is a 57 y.o. year-old male admitted 7/31 with AMS and fever thought 2/2 UTI and/or decubitus ulcer treated initially with vancomycin and pip/tazo.  SCr was 0.68 on admission and increased to 2.16 on 08/02.     Clinical Impression   Patient evaluated by Occupational Therapy. Pt has been at [facility] for greater than 6 months and will return to SNF for rehab.  No acute OT needs identified.   OT is signing off. Thank you for this referral.     OT Assessment  All further OT needs can be met in the next venue of care    Follow Up Recommendations  SNF    Barriers to Discharge Decreased caregiver support    Equipment Recommendations  None recommended by OT    Recommendations for Other Services    Frequency       Precautions / Restrictions Precautions Precautions: Fall Precaution Comments: pt with sacral decub and is a paraplegic Restrictions Weight Bearing Restrictions: No   Pertinent Vitals/Pain     ADL  Eating/Feeding: Set up Where Assessed - Eating/Feeding: Bed level Grooming: Wash/dry hands;Wash/dry face;Teeth care;Set up Where Assessed - Grooming: Supine, head of bed up Upper Body Bathing: Minimal assistance Where Assessed - Upper Body Bathing: Supine, head of bed up Lower Body Bathing: +1 Total assistance Where Assessed - Lower Body Bathing: Supine, head of bed up;Rolling right and/or left Upper Body Dressing: Moderate assistance Where Assessed - Upper Body Dressing: Supine, head of bed up Lower Body Dressing: +1 Total assistance Where Assessed - Lower Body Dressing: Supine, head of bed up;Rolling right and/or left Transfers/Ambulation Related to ADLs: transfer not attempted ADL Comments: Pt requires assist for all aspects  of LB ADLs    OT Diagnosis: Generalized weakness;Cognitive deficits;Paresis  OT Problem List: Decreased strength;Decreased activity tolerance;Impaired balance (sitting and/or standing);Obesity OT Treatment Interventions: Self-care/ADL training;Therapeutic exercise;DME and/or AE instruction;Therapeutic activities;Balance training;Patient/family education   OT Goals(Current goals can be found in the care plan section) Acute Rehab OT Goals Patient Stated Goal: To get stronger  Visit Information  Last OT Received On: 03/02/13 Assistance Needed: +2 History of Present Illness: The patient is a 57 y.o. year-old male admitted 7/31 with AMS and fever thought 2/2 UTI and/or decubitus ulcer treated initially with vancomycin and pip/tazo.  SCr was 0.68 on admission and increased to 2.16 on 08/02.         Prior Functioning     Home Living Family/patient expects to be discharged to:: Skilled nursing facility (pt resided at [facility]) Prior Function Level of Independence: Needs assistance Gait / Transfers Assistance Needed: Pt providing conflicting info.  States he was just getting started with therapy at [facility] and they were just doing bed level activity, and then later states he was getting out of bed ADL's / Homemaking Assistance Needed: Pt reports he was max A with LB ADLs bed level, and min A to set up for UB Communication / Swallowing Assistance Needed: none Communication Communication: No difficulties (garbled speech) Dominant Hand: Right         Vision/Perception     Cognition  Cognition Arousal/Alertness: Awake/alert Behavior During Therapy: WFL for tasks assessed/performed Overall Cognitive Status: No family/caregiver present to determine baseline cognitive functioning General Comments: Pt self distracts and requires mod verbal cues to maintain sustained  attention; pt. providing contradictory info. re: PLOF    Extremity/Trunk Assessment Upper Extremity Assessment Upper  Extremity Assessment: Overall WFL for tasks assessed Lower Extremity Assessment Lower Extremity Assessment: Defer to PT evaluation LLE Deficits / Details: AKA     Mobility Bed Mobility Bed Mobility: Rolling Right;Rolling Left Rolling Right: 4: Min guard;With rail Rolling Left: 3: Mod assist;With rail Details for Bed Mobility Assistance: Pt initially reaching for therapist to roll, but when encouraged to perform like he does it at [place], he was able to roll to Rt with rails, and mod A to roll to Lt - requires encouragement     Exercise     Balance     End of Session OT - End of Session Activity Tolerance: Patient tolerated treatment well Patient left: in bed;with call bell/phone within reach (Pt transferring units) Nurse Communication: Mobility status  GO     Navjot Loera M 03/02/2013, 11:52 AM

## 2013-03-02 NOTE — Progress Notes (Signed)
ANTICOAGULATION CONSULT NOTE - Follow Up Consult  Pharmacy Consult for Warfarin Indication: VTE prophylaxis  No Known Allergies  Labs:  Recent Labs  02/28/13 0430 03/01/13 0414  HGB  --  8.9*  HCT  --  26.7*  PLT  --  217  LABPROT 30.7* 25.1*  INR 3.08* 2.37*  CREATININE 4.69* 5.47*    Estimated Creatinine Clearance: 17.7 ml/min (by C-G formula based on Cr of 5.47).  Assessment: 57 yo M paraplegic, presented w/ AMS and concern w/ sepsis. Pt started coumadin s/p R knee plating/bone grafting (7/8). Started warfarin inpt 7.5mg  x2, with drastic elevation in INR. Held coumadin, and sent home (7/11) on 5mg  daily to be managed by SNF.   INR currently supra therapeutic and now trending down, likely d/t drug interaction with Bactrim previously.  INR now back in therapeutic range 8/5, unable to draw INR today  Goal of Therapy:  INR 2-3 Monitor platelets by anticoagulation protocol: Yes   Plan:  1. Coumadin 2 mg po x 1 dose tonight 2. Daily INR  Thank you. Okey Regal, PharmD [phone]  03/02/2013,11:57 AM

## 2013-03-02 NOTE — Progress Notes (Signed)
Report called to [facility] on unit [address]E. Patient to be transferred to RM 6E 26 via bed.

## 2013-03-02 NOTE — Progress Notes (Signed)
70M with nonoliguric AKI 2/2 suspected ATN, chronic indwelling foley after remote hx/o spinal cord injury, colostomy, BPAD, seizures.    Subjective:  Nl C3/C4/RF No new events Labs for today pending Excellent UOP Good appetite Nl Ostomy output Ad lib drinking  08/05 0701 - 08/06 0700 In: 1458 [P.O.:480; I.V.:978] Out: 3400 [Urine:3200; Stool:200]  Current meds: reviewed NaHCO3 650 TID, warfarin Current Labs: reviewed and pending for today   Physical Exam:  Blood pressure 180/93, pulse 55, temperature 97.7 F (36.5 C), temperature source Oral, resp. rate 17, height 5\' 4"  (1.626 m), weight 116 kg (255 lb 11.7 oz), SpO2 95.00%. NAD RRR Nl wob, ctab abd s/nt.  Ostomy with semisolid material No rashes/lesions 1+ LEE, pedal, pitting.    Assessment/Plan 1. AKI suspected from ATN: Labs have been acceptable.  Volume status ok.  Labs for today pending.  Cont supportive care and avoid nephrotoxins.  C3/C4 and RF negative making HCV related GN unlikely.    Sabra Heck MD 03/02/2013, 9:19 AM   Recent Labs Lab 02/27/13 0817 02/28/13 0430 03/01/13 0414  NA 135 138 137  K 4.0 3.8 3.4*  CL 101 105 107  CO2 20 17* 17*  GLUCOSE 93 92 98  BUN 29* 35* 40*  CREATININE 3.91* 4.69* 5.47*  CALCIUM 8.0* 7.8* 8.1*    Recent Labs Lab 02/24/13 1634 02/25/13 0545 02/26/13 0400 02/27/13 0817 03/01/13 0414  WBC 17.7* 18.2* 9.7 7.1 7.2  NEUTROABS 15.8* 16.7*  --   --   --   HGB 11.4* 10.4* 9.6* 9.4* 8.9*  HCT 35.7* 32.2* 30.6* 29.3* 26.7*  MCV 79.0 78.2 79.3 77.9* 75.2*  PLT 367 320 276 237 217    Current Facility-Administered Medications  Medication Dose Route Frequency Provider Last Rate Last Dose  . 0.9 %  sodium chloride infusion   Intravenous Continuous Lonia Blood, MD 75 mL/hr at 02/28/13 1044    . acetaminophen (TYLENOL) tablet 650 mg  650 mg Oral Q6H PRN Eduard Clos, MD   650 mg at 03/01/13 2219   Or  . acetaminophen (TYLENOL) suppository 650 mg  650 mg  Rectal Q6H PRN Eduard Clos, MD      . atorvastatin (LIPITOR) tablet 10 mg  10 mg Oral Daily Eduard Clos, MD   10 mg at 03/01/13 1021  . baclofen (LIORESAL) tablet 10 mg  10 mg Oral QID PRN Eduard Clos, MD      . feeding supplement (PRO-STAT SUGAR FREE 64) liquid 30 mL  30 mL Oral BID WC Ailene Ards, RD   30 mL at 03/01/13 1742  . fentaNYL (DURAGESIC - dosed mcg/hr) patch 75 mcg  75 mcg Transdermal Q72H Eduard Clos, MD   75 mcg at 02/27/13 2012  . folic acid (FOLVITE) tablet 1 mg  1 mg Oral Daily Eduard Clos, MD   1 mg at 03/01/13 1021  . hydrALAZINE (APRESOLINE) injection 10 mg  10 mg Intravenous Q4H PRN Eduard Clos, MD   10 mg at 03/01/13 2210  . lactulose (CHRONULAC) 10 GM/15ML solution 20 g  20 g Oral QID Eduard Clos, MD   20 g at 03/01/13 2122  . LORazepam (ATIVAN) injection 0.5 mg  0.5 mg Intravenous Q6H PRN Eduard Clos, MD   0.5 mg at 03/01/13 2211  . nitroGLYCERIN (NITROSTAT) SL tablet 0.4 mg  0.4 mg Sublingual Q5 min PRN Eduard Clos, MD      . ondansetron [facility]) tablet 4  mg  4 mg Oral Q6H PRN Eduard Clos, MD       Or  . ondansetron [facility]) injection 4 mg  4 mg Intravenous Q6H PRN Eduard Clos, MD      . sodium bicarbonate tablet 650 mg  650 mg Oral TID Arita Miss, MD   650 mg at 03/01/13 2122  . sodium chloride 0.9 % injection 3 mL  3 mL Intravenous Q12H Eduard Clos, MD   3 mL at 03/01/13 2125  . vitamin B-12 (CYANOCOBALAMIN) tablet 1,000 mcg  1,000 mcg Oral Daily Eduard Clos, MD   1,000 mcg at 03/01/13 1021  . Warfarin - Pharmacist Dosing Inpatient   Does not apply q1800 Dennie Fetters, [facility]      . zinc sulfate capsule 220 mg  220 mg Oral Daily Eduard Clos, MD   220 mg at 03/01/13 1021

## 2013-03-02 NOTE — Progress Notes (Signed)
[facility] Progress Note [name] TEAM 1 - Stepdown/ICU TEAM   [name] YNW:[id number] DOB: 1955-11-02 DOA: 02/24/2013 PCP: Samuel Sleeper, MD  Brief narrative: 57 year old paraplegic patient who lives in a nursing home [name]) and has a history of hypertension, decubitus ulcer followed at the wound clinic at [facility], chronic Foley catheter, history of hep C, prior alcohol and polysubstance abuse, diverting colostomy, chronic pain, left hip disarticulation with flap surgery, hyperlipidemia, bipolar disorder and seizures.   He fractured his right knee 6 months ago. He underwent surgery on the lateral condyle of the right knee on 02/01/2013. He has been on coumadin for DVT prophylaxis since.  Patient was brought to the [name] with altered mental status and noted to have a fever of 101.6.  Assessment/Plan:  Sepsis with Fever and leukocytosis -Vanc and Zosyn started in the ER for empiric tx of UTI -Due to development of acute renal failure, vancomycin was discontinued 8/4 -Blood cultures neg and urine cx growing multiple morphotype's  -since renal function worsening/ WBC normal/afebrile all abx were d/c at day 5 of tx  Acute renal failure/ATN -new since admission-continues to worsen but still nonoliguric -Likely due to ATN as granular casts are noted in the urine -Likely complicated by vancomycin which was dc'd 8/3- vanc level 24 hrs later was still elevated at 35.7 -Urine Na+ >40 so not hypovolemic -Renal consulted 8/4 -d/c'd oxycodone and Cymbalta on 8/2 due to renal failure -d/c lamictal and lyrica due to worsening renal function -Nephro added oral bicarb 8/4 -difficult lab stick - will try to obtain renal panel in am, 8/7   Acute Respiratory Failure with hypoxia -new left basilar crackles -CXR 8/5 with pulm vascular congestion, and ?infiltrate in LLL but no fever or leukocytosis- weight up by 4 kg since admission- follow closely -Cont IS and flutter  valve  Encephalopathy acute- h/o hepatic encephalopathy?? -head CT negative on admission -has been refusing Lactulose this admission-ammonia level 29 (8/4) -suspect confusion earlier was due to uremia/decreased renal clearance of meds so d/c'd oxycodone and Cymbalta and lamictal and lyrica  -seems at baseline mental function with apparent some baseline confusion-requesting meds for his neuropathic pain so will resume Oxy and Lamictal at lower than pre admit doses -cont to hold Lyrica since renally cleared and renal function still poor  Coagulopathy -on Coumadin for DVT prophylaxis after left femoral surgery -pharmacy to manage  Hypotension with h/o HTN  -Takes when necessary clonidine at the facility for systolic blood pressure greater than 180 or diastolic greater than 90 -Has been placed on when necessary hydralazine here  Sacral decub -Monitored by [name] as outpatient -Evaluated by wound care - does not appear to be infected - has small amount of bloody discharge possibly due to elevated INR  Bipolar disorder, unspecified -hold home meds due to renal failure  Hepatitis C -stable   Chronic pain -On fentanyl patch, oxycodone 3 times a day when necessary, Lyrica 3 times a day routine and Cymbalta preadmit  Paraplegia/Colostomy and chronic Foley catheter -as above re: [place] and cx  -pt requests begin PT/OT - says usually OOB to chair regularly at facility  Code Status: FULL Family Communication: none Disposition Plan: Transfer to renal floor  Consultants: Nephrology  Procedures: none  Antibiotics: Vancomycin 7/31 >>> 8/3 Zosyn 7/31>>> 8/4  DVT prophylaxis: Coumadin  HPI/Subjective: Pt is mildly confused but appropriate otherwise. No complaints except for leg pain.  Objective: Blood pressure 180/93, pulse 55, temperature 97.7 F (36.5 C), temperature source  Oral, resp. rate 17, height 5\' 4"  (1.626 m), weight 116 kg (255 lb 11.7 oz), SpO2  95.00%.  Intake/Output Summary (Last 24 hours) at 03/02/13 1133 Last data filed at 03/02/13 0900  Gross per 24 hour  Intake   1368 ml  Output   2400 ml  Net  -1032 ml   Exam: General: No acute respiratory distress Lungs: Clear to auscultation bilaterally without wheezes or crackles Cardiovascular: Regular rate and rhythm without murmur gallop or rub normal S1 and S2 Abdomen: left sided colostomy with brown stool, nontender, nondistended, soft, bowel sounds positive, no rebound, no ascites, no appreciable mass - very large old midline scar Genitourinary: Foley with clear yellow urine Extremities: No significant cyanosis, clubbing, or edema bilateral lower extremities - left stump clean  Data Reviewed: Basic Metabolic Panel:  Recent Labs Lab 02/26/13 0400 02/26/13 1039 02/27/13 0817 02/28/13 0430 03/01/13 0414  NA 137 135 135 138 137  K 3.8 4.0 4.0 3.8 3.4*  CL 103 102 101 105 107  CO2 21 21 20  17* 17*  GLUCOSE 99 90 93 92 98  BUN 18 20 29* 35* 40*  CREATININE 2.16* 2.63* 3.91* 4.69* 5.47*  CALCIUM 8.0* 8.0* 8.0* 7.8* 8.1*   Liver Function Tests:  Recent Labs Lab 02/24/13 1634 02/25/13 0545  AST 20 19  ALT 10 9  ALKPHOS 150* 126*  BILITOT 0.5 0.8  PROT 7.6 6.6  ALBUMIN 3.1* 2.6*    Recent Labs Lab 02/24/13 1834 02/28/13 1438  AMMONIA 57 29   CBC:  Recent Labs Lab 02/24/13 1634 02/25/13 0545 02/26/13 0400 02/27/13 0817 03/01/13 0414  WBC 17.7* 18.2* 9.7 7.1 7.2  NEUTROABS 15.8* 16.7*  --   --   --   HGB 11.4* 10.4* 9.6* 9.4* 8.9*  HCT 35.7* 32.2* 30.6* 29.3* 26.7*  MCV 79.0 78.2 79.3 77.9* 75.2*  PLT 367 320 276 237 217   CBG:  Recent Labs Lab 03/01/13 0819 03/01/13 1158 03/01/13 1552 03/01/13 2024 03/02/13 0835  GLUCAP 102* 98 120* 97 91    Recent Results (from the past 240 hour(s))  CULTURE, BLOOD (ROUTINE X 2)     Status: None   Collection Time    02/24/13  4:35 PM      Result Value Range Status   Specimen Description BLOOD ARM  LEFT   Final   Special Requests BOTTLES DRAWN AEROBIC AND ANAEROBIC 4CCBLUE 5CCRED   Final   Culture  Setup Time 02/25/2013 01:20   Final   Culture     Final   Value:        BLOOD CULTURE RECEIVED NO GROWTH TO DATE CULTURE WILL BE HELD FOR 5 DAYS BEFORE ISSUING A FINAL NEGATIVE REPORT   Report Status PENDING   Incomplete  CULTURE, BLOOD (ROUTINE X 2)     Status: None   Collection Time    02/24/13  4:35 PM      Result Value Range Status   Specimen Description BLOOD ARM RIGHT   Final   Special Requests BOTTLES DRAWN AEROBIC ONLY 5CC   Final   Culture  Setup Time 02/25/2013 01:20   Final   Culture     Final   Value:        BLOOD CULTURE RECEIVED NO GROWTH TO DATE CULTURE WILL BE HELD FOR 5 DAYS BEFORE ISSUING A FINAL NEGATIVE REPORT   Report Status PENDING   Incomplete  URINE CULTURE     Status: None   Collection Time  02/24/13  5:41 PM      Result Value Range Status   Specimen Description URINE, CATHETERIZED   Final   Special Requests NONE   Final   Culture  Setup Time 02/24/2013 19:46   Final   Colony Count >=100,000 COLONIES/ML   Final   Culture     Final   Value: Multiple bacterial morphotypes present, none predominant. Suggest appropriate recollection if clinically indicated.   Report Status 02/26/2013 FINAL   Final  MRSA PCR SCREENING     Status: Abnormal   Collection Time    02/24/13  8:44 PM      Result Value Range Status   MRSA by PCR POSITIVE (*) NEGATIVE Final   Comment:            The GeneXpert MRSA Assay (FDA     approved for NASAL specimens     only), is one component of a     comprehensive MRSA colonization     surveillance program. It is not     intended to diagnose MRSA     infection nor to guide or     monitor treatment for     MRSA infections.     RESULT CALLED TO, READ BACK BY AND VERIFIED WITH:     Oren Bracket RN [id number] 02/24/13 A BROWNING  WOUND CULTURE     Status: None   Collection Time    02/25/13  5:10 AM      Result Value Range Status   Specimen  Description WOUND SACRAL   Final   Special Requests NONE   Final   Gram Stain     Final   Value: FEW WBC PRESENT, PREDOMINANTLY PMN     NO SQUAMOUS EPITHELIAL CELLS SEEN     FEW GRAM NEGATIVE RODS   Culture     Final   Value: MULTIPLE ORGANISMS PRESENT, NONE PREDOMINANT NO STAPHYLOCOCCUS AUREUS ISOLATED NO GROUP A STREP (S.PYOGENES) ISOLATED   Report Status 02/27/2013 FINAL   Final     Studies:  Recent x-ray studies have been reviewed in detail by the Attending Physician  Scheduled Meds:  Scheduled Meds: . atorvastatin  10 mg Oral Daily  . feeding supplement  30 mL Oral BID WC  . fentaNYL  75 mcg Transdermal Q72H  . folic acid  1 mg Oral Daily  . lactulose  20 g Oral QID  . lamoTRIgine  100 mg Oral BID  . sodium bicarbonate  650 mg Oral TID  . sodium chloride  3 mL Intravenous Q12H  . vitamin B-12  1,000 mcg Oral Daily  . Warfarin - Pharmacist Dosing Inpatient   Does not apply q1800  . zinc sulfate  220 mg Oral Daily    Time spent on care of this patient: 35 minutes   ELLIS,ALLISON L., ANP.  [facility] Office  [phone] Pager - Text Page per Loretha Stapler as per below:  On-Call/Text Page:      [email or web address]      password [email or web address]  If 7PM-7AM, please contact night-coverage [email or web address] Password [email or web address] 03/02/2013, 11:33 AM   LOS: 6 days   I have personally examined this patient and reviewed the entire database. I have reviewed the above note, made any necessary editorial changes, and agree with its content.  Lonia Blood, MD [facility]

## 2013-03-03 DIAGNOSIS — I10 Essential (primary) hypertension: Secondary | ICD-10-CM

## 2013-03-03 DIAGNOSIS — E876 Hypokalemia: Secondary | ICD-10-CM

## 2013-03-03 LAB — RENAL FUNCTION PANEL
CO2: 21 mEq/L (ref 19–32)
Calcium: 8 mg/dL — ABNORMAL LOW (ref 8.4–10.5)
Creatinine, Ser: 5.05 mg/dL — ABNORMAL HIGH (ref 0.50–1.35)
Glucose, Bld: 91 mg/dL (ref 70–99)
Phosphorus: 5.7 mg/dL — ABNORMAL HIGH (ref 2.3–4.6)
Sodium: 141 mEq/L (ref 135–145)

## 2013-03-03 LAB — CULTURE, BLOOD (ROUTINE X 2): Culture: NO GROWTH

## 2013-03-03 LAB — GLUCOSE, CAPILLARY: Glucose-Capillary: 103 mg/dL — ABNORMAL HIGH (ref 70–99)

## 2013-03-03 LAB — PROTIME-INR: INR: 1.78 — ABNORMAL HIGH (ref 0.00–1.49)

## 2013-03-03 MED ORDER — AMLODIPINE BESYLATE 5 MG PO TABS
5.0000 mg | ORAL_TABLET | Freq: Every day | ORAL | Status: DC
  Administered 2013-03-03: 5 mg via ORAL
  Filled 2013-03-03 (×2): qty 1

## 2013-03-03 MED ORDER — WARFARIN SODIUM 4 MG PO TABS
4.0000 mg | ORAL_TABLET | Freq: Once | ORAL | Status: AC
  Administered 2013-03-03: 4 mg via ORAL
  Filled 2013-03-03: qty 1

## 2013-03-03 MED ORDER — POTASSIUM CHLORIDE CRYS ER 20 MEQ PO TBCR
40.0000 meq | EXTENDED_RELEASE_TABLET | ORAL | Status: AC
  Administered 2013-03-03 (×2): 40 meq via ORAL
  Filled 2013-03-03 (×2): qty 2

## 2013-03-03 MED ORDER — ADULT MULTIVITAMIN W/MINERALS CH
1.0000 | ORAL_TABLET | Freq: Every day | ORAL | Status: DC
  Administered 2013-03-03 – 2013-03-04 (×2): 1 via ORAL
  Filled 2013-03-03 (×2): qty 1

## 2013-03-03 NOTE — Progress Notes (Signed)
89M with nonoliguric AKI 2/2 suspected ATN [baseline SCr 0.6], chronic indwelling foley after remote hx/o spinal cord injury, colostomy, BPAD, seizures.   Subjective:  SCr improved yesterday and this AM Still very good UOP Low K, started on PO K x3doses Good appetite Wants to eat more salt!  rec ibuprofen x 1 overnight  08/06 0701 - 08/07 0700 In: 1766.3 [P.O.:1080; I.V.:686.3] Out: 3350 [Urine:3350]  Current meds: reviewed including KCl Current Labs: reviewed SCr 5.05, K 2.7, BUN 41   Physical Exam:  Blood pressure 170/99, pulse 60, temperature 97.5 F (36.4 C), temperature source Oral, resp. rate 20, height 5\' 4"  (1.626 m), weight 117.5 kg (259 lb 0.7 oz), SpO2 93.00%. NAD, appears more energetic Abd s/nt/nd 2+ pitting pedal edema CTAB RRR Foley in place  Assessment/Plan 1. AKI, suspected from ATN: Pt with good UOP and improving SCr.  Ok with trial of furosemide if breathing worsens.  No NSAIDS including ibuprofen.   2. Hypokalemia: on repletion, follow 3. HTN: persistently elevated, add amlodipine 5mg  PO qhs.   Sabra Heck MD 03/03/2013, 8:05 AM   Recent Labs Lab 03/01/13 0414 03/02/13 1703 03/03/13 0539  NA 137 139 141  K 3.4* 3.3* 2.7*  CL 107 106 108  CO2 17* 17* 21  GLUCOSE 98 108* 91  BUN 40* 40* 41*  CREATININE 5.47* 5.24* 5.05*  CALCIUM 8.1* 8.3* 8.0*  PHOS  --  5.3* 5.7*    Recent Labs Lab 02/24/13 1634 02/25/13 0545 02/26/13 0400 02/27/13 0817 03/01/13 0414  WBC 17.7* 18.2* 9.7 7.1 7.2  NEUTROABS 15.8* 16.7*  --   --   --   HGB 11.4* 10.4* 9.6* 9.4* 8.9*  HCT 35.7* 32.2* 30.6* 29.3* 26.7*  MCV 79.0 78.2 79.3 77.9* 75.2*  PLT 367 320 276 237 217    Current Facility-Administered Medications  Medication Dose Route Frequency Provider Last Rate Last Dose  . acetaminophen (TYLENOL) tablet 650 mg  650 mg Oral Q6H PRN Lonia Blood, MD   650 mg at 03/02/13 1813  . atorvastatin (LIPITOR) tablet 10 mg  10 mg Oral Daily Eduard Clos, MD   10 mg at 03/02/13 1013  . baclofen (LIORESAL) tablet 10 mg  10 mg Oral QID PRN Eduard Clos, MD   10 mg at 03/02/13 2302  . feeding supplement (PRO-STAT SUGAR FREE 64) liquid 30 mL  30 mL Oral BID WC Ailene Ards, RD   30 mL at 03/01/13 1742  . fentaNYL (DURAGESIC - dosed mcg/hr) patch 75 mcg  75 mcg Transdermal Q72H Eduard Clos, MD   75 mcg at 03/02/13 2301  . folic acid (FOLVITE) tablet 1 mg  1 mg Oral Daily Eduard Clos, MD   1 mg at 03/02/13 1013  . lactulose (CHRONULAC) 10 GM/15ML solution 20 g  20 g Oral QID Eduard Clos, MD   20 g at 03/02/13 1013  . lamoTRIgine (LAMICTAL) tablet 100 mg  100 mg Oral BID Russella Dar, NP   100 mg at 03/02/13 2302  . nitroGLYCERIN (NITROSTAT) SL tablet 0.4 mg  0.4 mg Sublingual Q5 min PRN Eduard Clos, MD      . ondansetron (ZOFRAN-ODT) disintegrating tablet 4 mg  4 mg Oral Q8H PRN Leda Gauze, NP   4 mg at 03/02/13 2037  . oxyCODONE (Oxy IR/ROXICODONE) immediate release tablet 10 mg  10 mg Oral TID PRN Russella Dar, NP      . potassium chloride SA (  K-DUR,KLOR-CON) CR tablet 40 mEq  40 mEq Oral Q4H Nishant Dhungel, MD      . promethazine (PHENERGAN) suppository 25 mg  25 mg Rectal Q6H PRN Leda Gauze, NP      . sodium bicarbonate tablet 650 mg  650 mg Oral TID Arita Miss, MD   650 mg at 03/02/13 2301  . vitamin B-12 (CYANOCOBALAMIN) tablet 1,000 mcg  1,000 mcg Oral Daily Eduard Clos, MD   1,000 mcg at 03/02/13 1013  . Warfarin - Pharmacist Dosing Inpatient   Does not apply q1800 Dennie Fetters, [facility]      . zinc sulfate capsule 220 mg  220 mg Oral Daily Eduard Clos, MD   220 mg at 03/02/13 1013

## 2013-03-03 NOTE — Progress Notes (Signed)
NUTRITION FOLLOW UP  Intervention:   1. Continue 30 ml Pro-stat BID, each supplement provides 100 kcal and 15 gm protein  2. Agree with zinc supplements  3. Add oral multivitamin daily   Nutrition Dx:   Increased nutrient needs related to wound healing as evidenced by estimated nutrition needs. Ongoing   Goal:   Pt to meet >/=90% estimated nutrition needs. Improving.   Monitor:   Po intake, weight trends, labs, I/O's   Assessment:   Patient with known hx of paraplegia secondary to spinal cord injury, HTN, bipolar disorder and seizures was brought to the ER after patient was found to be in altered mental status; in ER was also found to be febrile and UA showed features of UTI.  Abx and nephrotoxic medications d/c'd after worsening renal function. Continues to have good UOP, Scr improved slightly. No indication at this time for RRT per Nephrology.  RD consulted for assessment of of nutritional status. RD previously following pt for increased nutrient needs related to pressure ulcer on buttocks.   Current oral nutrition supplements include Pro-stat 30 ml BID, providing additional 30 gm protein and 200 kcal daily. Pt is drinking these, but is not happy about it. Also c/o not being able to have any salt. Denied need for additional oral nutrition supplements or offers for snacks. "you wont give me the kind of snacks I want to eat".   Height: Ht Readings from Last 1 Encounters:  02/24/13 5\' 4"  (1.626 m)    Weight Status:   Wt Readings from Last 1 Encounters:  03/02/13 259 lb 0.7 oz (117.5 kg)    Re-estimated needs:  Kcal: 2000-2200  Protein: 120-130 gm  Fluid: 2.0-2.2 L  Skin: Stage 3 pressure ulcer on buttocks   Diet Order: Cardiac   Intake/Output Summary (Last 24 hours) at 03/03/13 1117 Last data filed at 03/03/13 1045  Gross per 24 hour  Intake 1976.25 ml  Output   4500 ml  Net -2523.75 ml  Net -3.6 L this admission   Last BM: 8/5 via colostomy    Labs:   Recent  Labs Lab 03/01/13 0414 03/02/13 1703 03/03/13 0539  NA 137 139 141  K 3.4* 3.3* 2.7*  CL 107 106 108  CO2 17* 17* 21  BUN 40* 40* 41*  CREATININE 5.47* 5.24* 5.05*  CALCIUM 8.1* 8.3* 8.0*  PHOS  --  5.3* 5.7*  GLUCOSE 98 108* 91    CBG (last 3)   Recent Labs  03/02/13 1628 03/02/13 2320 03/03/13 0807  GLUCAP 102* 120* 103*    Scheduled Meds: . amLODipine  5 mg Oral QHS  . atorvastatin  10 mg Oral Daily  . feeding supplement  30 mL Oral BID WC  . fentaNYL  75 mcg Transdermal Q72H  . folic acid  1 mg Oral Daily  . lactulose  20 g Oral QID  . lamoTRIgine  100 mg Oral BID  . potassium chloride  40 mEq Oral Q4H  . sodium bicarbonate  650 mg Oral TID  . vitamin B-12  1,000 mcg Oral Daily  . warfarin  4 mg Oral ONCE-1800  . Warfarin - Pharmacist Dosing Inpatient   Does not apply q1800  . zinc sulfate  220 mg Oral Daily    Continuous Infusions:  none   Clarene Duke RD, LDN Pager [phone] After Hours pager [phone]

## 2013-03-03 NOTE — Progress Notes (Addendum)
[facility] PROGRESS NOTE  Delron Comer UJW:[id number] DOB: 17-Aug-1955 DOA: 02/24/2013 PCP: Terald Sleeper, MD  Brief narrative:  57 year old paraplegic male from [facility] with  history of hypertension, decubitus ulcer followed at the wound clinic at [facility], chronic Foley catheter, history of hep C, prior alcohol and polysubstance abuse, diverting colostomy, chronic pain, left hip disarticulation with flap surgery, hyperlipidemia, bipolar disorder and seizures, recent lateral condyle repair for rt knee fracture was brought to the hospital with altered mental status and noted to have a fever of 101.6.  Patient admitted to stepdown for sepsis.  Assessment/Plan:  Sepsis  Possibly due to UTI and empirically started on vancomycin Zosyn -Vanc and Zosyn started in the ER for empiric tx of UTI  - . All antibiotics were discontinued after 5 days given worsening renal failure. -Blood cultures neg and urine cx growing multiple morphotype's  - Acute renal failure/ATN  -Renal function significantly worsened but continues to make adequate urine -Likely due to ATN. granular casts  noted in the urine  -Likely complicated by vancomycin which was dc'd 8/3- vanc level 24 hrs later was still elevated at 35.7  -Renal closely following. creatinine slightly improved today -disontinued oxycodone,  Cymbalta , Lamictal and lyrica  due to renal failure . Oxycodone and Lamictal resumed at a lower dose given ongoing pain. -Renal added oral bicarb 8/4 . CO2 improved to 21 today  Hypokalemia Potassium of 2.7 today. Replenishing with oral KCl. Monitor in a.m. labs.  Acute Respiratory Failure with hypoxia  -CXR 8/5 with pulm vascular congestion, and ?infiltrate in LLL but no fever or leukocytosis . Lungs sound clear to auscultation today and patient maintaining sats on room. -Cont IS and flutter valve    Acute Encephalopathy /- h/o hepatic encephalopathy??  Likely secondary to uremia vs  medications ( oxycodone, cymbalta, lyrica, lamictal) -head CT negative on admission  -has been refusing Lactulose this admission -Mental status at baseline now. oxycodone and Lamictal resumed at lower doses.  Coagulopathy  -on Coumadin for DVT prophylaxis after left femoral surgery  -pharmacy managing dose  HTN Hypotension on presentation. BP now elevated. Will increase amlodipine dose.  Sacral decub  -Monitored by [facility] wound clinic as outpatient   Bipolar disorder, unspecified  -hold home meds due to renal failure   Hepatitis C  -stable   Chronic pain  -On fentanyl patch. Prn oxycodone and lamictal  Paraplegia/Colostomy and chronic Foley catheter   Code Status: FULL  Family Communication: none  Disposition Plan: return to SNF  Consultants:  Nephrology   Procedures:  none   Antibiotics:  Vancomycin 7/31 >>> 8/3  Zosyn 7/31>>> 8/4     HPI/Subjective:  No overnight issues  Objective: Filed Vitals:   03/03/13 1400  BP: 162/84  Pulse: 60  Temp: 98.6 F (37 C)  Resp: 20    Intake/Output Summary (Last 24 hours) at 03/03/13 1618 Last data filed at 03/03/13 1300  Gross per 24 hour  Intake   1560 ml  Output   2900 ml  Net  -1340 ml   Filed Weights   02/27/13 0308 03/01/13 0440 03/02/13 2038  Weight: 116 kg (255 lb 11.7 oz) 116 kg (255 lb 11.7 oz) 117.5 kg (259 lb 0.7 oz)    Exam:   General: Middle-aged male lying in bed in no acute distress  HEENT: No pallor, moist oral mucosa  Chest: Clear to auscultation bilaterally, no added sounds  CVS: Normal S1 and S2, no murmurs rub or gallop  Abdomen: Soft,  nontender, nondistended, bowel sounds present, left-sided colostomy, Foley in place draining clear urine  Extremities: Has left stump, paraplegic  CNS: AAO x3 Data Reviewed: Basic Metabolic Panel:  Recent Labs Lab 02/27/13 0817 02/28/13 0430 03/01/13 0414 03/02/13 1703 03/03/13 0539  NA 135 138 137 139 141  K 4.0 3.8 3.4* 3.3* 2.7*   CL 101 105 107 106 108  CO2 20 17* 17* 17* 21  GLUCOSE 93 92 98 108* 91  BUN 29* 35* 40* 40* 41*  CREATININE 3.91* 4.69* 5.47* 5.24* 5.05*  CALCIUM 8.0* 7.8* 8.1* 8.3* 8.0*  PHOS  --   --   --  5.3* 5.7*   Liver Function Tests:  Recent Labs Lab 02/24/13 1634 02/25/13 0545 03/02/13 1703 03/03/13 0539  AST 20 19  --   --   ALT 10 9  --   --   ALKPHOS 150* 126*  --   --   BILITOT 0.5 0.8  --   --   PROT 7.6 6.6  --   --   ALBUMIN 3.1* 2.6* 2.2* 2.2*   No results found for this basename: LIPASE, AMYLASE,  in the last 168 hours  Recent Labs Lab 02/24/13 1834 02/28/13 1438  AMMONIA 57 29   CBC:  Recent Labs Lab 02/24/13 1634 02/25/13 0545 02/26/13 0400 02/27/13 0817 03/01/13 0414  WBC 17.7* 18.2* 9.7 7.1 7.2  NEUTROABS 15.8* 16.7*  --   --   --   HGB 11.4* 10.4* 9.6* 9.4* 8.9*  HCT 35.7* 32.2* 30.6* 29.3* 26.7*  MCV 79.0 78.2 79.3 77.9* 75.2*  PLT 367 320 276 237 217   Cardiac Enzymes: No results found for this basename: CKTOTAL, CKMB, CKMBINDEX, TROPONINI,  in the last 168 hours BNP (last 3 results) No results found for this basename: PROBNP,  in the last 8760 hours CBG:  Recent Labs Lab 03/02/13 1233 03/02/13 1628 03/02/13 2320 03/03/13 0807 03/03/13 1123  GLUCAP 115* 102* 120* 103* 144*    Recent Results (from the past 240 hour(s))  CULTURE, BLOOD (ROUTINE X 2)     Status: None   Collection Time    02/24/13  4:35 PM      Result Value Range Status   Specimen Description BLOOD ARM LEFT   Final   Special Requests BOTTLES DRAWN AEROBIC AND ANAEROBIC 4CCBLUE 5CCRED   Final   Culture  Setup Time     Final   Value: 02/25/2013 01:20     Performed at [facility]   Culture     Final   Value: NO GROWTH 5 DAYS     Performed at [facility]   Report Status 03/03/2013 FINAL   Final  CULTURE, BLOOD (ROUTINE X 2)     Status: None   Collection Time    02/24/13  4:35 PM      Result Value Range Status   Specimen Description BLOOD ARM RIGHT    Final   Special Requests BOTTLES DRAWN AEROBIC ONLY 5CC   Final   Culture  Setup Time     Final   Value: 02/25/2013 01:20     Performed at [facility]   Culture     Final   Value: NO GROWTH 5 DAYS     Performed at [facility]   Report Status 03/03/2013 FINAL   Final  URINE CULTURE     Status: None   Collection Time    02/24/13  5:41 PM      Result  Value Range Status   Specimen Description URINE, CATHETERIZED   Final   Special Requests NONE   Final   Culture  Setup Time 02/24/2013 19:46   Final   Colony Count >=100,000 COLONIES/ML   Final   Culture     Final   Value: Multiple bacterial morphotypes present, none predominant. Suggest appropriate recollection if clinically indicated.   Report Status 02/26/2013 FINAL   Final  MRSA PCR SCREENING     Status: Abnormal   Collection Time    02/24/13  8:44 PM      Result Value Range Status   MRSA by PCR POSITIVE (*) NEGATIVE Final   Comment:            The GeneXpert MRSA Assay (FDA     approved for NASAL specimens     only), is one component of a     comprehensive MRSA colonization     surveillance program. It is not     intended to diagnose MRSA     infection nor to guide or     monitor treatment for     MRSA infections.     RESULT CALLED TO, READ BACK BY AND VERIFIED WITH:     Oren Bracket RN [id number] 02/24/13 A BROWNING  WOUND CULTURE     Status: None   Collection Time    02/25/13  5:10 AM      Result Value Range Status   Specimen Description WOUND SACRAL   Final   Special Requests NONE   Final   Gram Stain     Final   Value: FEW WBC PRESENT, PREDOMINANTLY PMN     NO SQUAMOUS EPITHELIAL CELLS SEEN     FEW GRAM NEGATIVE RODS   Culture     Final   Value: MULTIPLE ORGANISMS PRESENT, NONE PREDOMINANT NO STAPHYLOCOCCUS AUREUS ISOLATED NO GROUP A STREP (S.PYOGENES) ISOLATED   Report Status 02/27/2013 FINAL   Final     Studies: No results found.  Scheduled Meds: . amLODipine  5 mg Oral QHS  . atorvastatin  10 mg  Oral Daily  . feeding supplement  30 mL Oral BID WC  . fentaNYL  75 mcg Transdermal Q72H  . folic acid  1 mg Oral Daily  . lactulose  20 g Oral QID  . lamoTRIgine  100 mg Oral BID  . multivitamin with minerals  1 tablet Oral Daily  . sodium bicarbonate  650 mg Oral TID  . vitamin B-12  1,000 mcg Oral Daily  . warfarin  4 mg Oral ONCE-1800  . Warfarin - Pharmacist Dosing Inpatient   Does not apply q1800  . zinc sulfate  220 mg Oral Daily   Continuous Infusions:     Time spent: 25 minutes    Abisola Carrero  [facility] Pager [phone] If 7PM-7AM, please contact night-coverage at [email or web address], password [facility] 03/03/2013, 4:18 PM  LOS: 7 days

## 2013-03-03 NOTE — Progress Notes (Signed)
ANTICOAGULATION CONSULT NOTE - Follow Up Consult  Pharmacy Consult for Warfarin Indication: VTE prophylaxis  No Known Allergies  Labs:  Recent Labs  03/01/13 0414 03/02/13 1703 03/03/13 0525 03/03/13 0539  HGB 8.9*  --   --   --   HCT 26.7*  --   --   --   PLT 217  --   --   --   LABPROT 25.1*  --  20.2*  --   INR 2.37*  --  1.78*  --   CREATININE 5.47* 5.24*  --  5.05*    Estimated Creatinine Clearance: 19.3 ml/min (by C-G formula based on Cr of 5.05).  Assessment: 58 yo M paraplegic, presented w/ AMS and concern w/ sepsis. Pt started coumadin s/p R knee plating/bone grafting (7/8). Started warfarin inpt at 7.5mg  x 2, with drastic elevation in INR. Held coumadin, and sent home (7/11) on 5mg  daily to be managed by SNF.   INR currently subtherapeutic, trending down without drug interaction with Bactrim (last dose 7/31).  Goal of Therapy:  INR 2-3 Monitor platelets by anticoagulation protocol: Yes   Plan:  1. Coumadin 4 mg po x 1 dose tonight 2. Daily INR  [employer], Pharm.D., BCPS Clinical Pharmacist Pager [phone] 03/03/2013 10:54 AM

## 2013-03-03 NOTE — Progress Notes (Signed)
CRITICAL VALUE ALERT  Critical value received: Potassium 2.7  Date of notification:  03/03/13  Time of notification:  07:47  Critical value read back:yes  Nurse who received alert:  Fayne Norrie   MD notified (1st page):  Dr. Dorris Carnes. Dhungel paged at 07:48 Dr. Gonzella Lex responded at 07:49. Orders given for PO Potassium (see MAR).  Will give Potassium and will continue to monitor patient closely.

## 2013-03-03 NOTE — Progress Notes (Signed)
M.D. Ordered air overlay mattress for patient. Patient was explained the benefits of the air overlay mattress. Patient refused air overlay mattress.

## 2013-03-03 NOTE — Clinical Social Work Note (Signed)
Fl-2 updated and forwarded to [facility] skilled nursing facility along with history & physical. CSW talked with admissions director Lacinda Axon and she indicated that patient is LTC with them and they anticipate him returning. CSW spoke briefly with patient and his plan is to return to facility at discharge.  Genelle Bal, MSW, LCSW [phone]

## 2013-03-04 LAB — BASIC METABOLIC PANEL
BUN: 43 mg/dL — ABNORMAL HIGH (ref 6–23)
CO2: 21 mEq/L (ref 19–32)
Calcium: 8.1 mg/dL — ABNORMAL LOW (ref 8.4–10.5)
Chloride: 108 mEq/L (ref 96–112)
Creatinine, Ser: 4.61 mg/dL — ABNORMAL HIGH (ref 0.50–1.35)

## 2013-03-04 LAB — GLUCOSE, CAPILLARY: Glucose-Capillary: 143 mg/dL — ABNORMAL HIGH (ref 70–99)

## 2013-03-04 MED ORDER — SODIUM BICARBONATE 650 MG PO TABS: 650.0000 mg | ORAL_TABLET | Freq: Three times a day (TID) | ORAL | Status: DC

## 2013-03-04 MED ORDER — DULOXETINE HCL 30 MG PO CPEP: 30.0000 mg | ORAL_CAPSULE | Freq: Every day | ORAL | Status: DC

## 2013-03-04 MED ORDER — LAMOTRIGINE 200 MG PO TABS: 100.0000 mg | ORAL_TABLET | Freq: Two times a day (BID) | ORAL | Status: DC

## 2013-03-04 MED ORDER — ACETAMINOPHEN 325 MG PO TABS: 650.0000 mg | ORAL_TABLET | Freq: Four times a day (QID) | ORAL | Status: DC | PRN

## 2013-03-04 MED ORDER — CLONAZEPAM 1 MG PO TABS: 1.0000 mg | ORAL_TABLET | Freq: Three times a day (TID) | ORAL | Status: DC

## 2013-03-04 MED ORDER — POTASSIUM CHLORIDE CRYS ER 20 MEQ PO TBCR
40.0000 meq | EXTENDED_RELEASE_TABLET | Freq: Once | ORAL | Status: AC
  Administered 2013-03-04: 40 meq via ORAL
  Filled 2013-03-04: qty 2

## 2013-03-04 MED ORDER — OXYCODONE HCL 15 MG PO TABS: 15.0000 mg | ORAL_TABLET | Freq: Three times a day (TID) | ORAL | Status: DC | PRN

## 2013-03-04 MED ORDER — PRO-STAT SUGAR FREE PO LIQD: 30.0000 mL | Freq: Two times a day (BID) | ORAL | Status: DC

## 2013-03-04 MED ORDER — CLONAZEPAM 0.5 MG PO TABS: 0.5000 mg | ORAL_TABLET | Freq: Three times a day (TID) | ORAL | Status: DC | PRN

## 2013-03-04 MED ORDER — ADULT MULTIVITAMIN W/MINERALS CH: 1.0000 | ORAL_TABLET | Freq: Every day | ORAL | Status: DC

## 2013-03-04 MED ORDER — PREGABALIN 75 MG PO CAPS: 75.0000 mg | ORAL_CAPSULE | Freq: Two times a day (BID) | ORAL | Status: DC

## 2013-03-04 MED ORDER — AMLODIPINE BESYLATE 5 MG PO TABS: 5.0000 mg | ORAL_TABLET | Freq: Every day | ORAL | Status: DC

## 2013-03-04 MED ORDER — ZOLPIDEM TARTRATE 5 MG PO TABS: 5.0000 mg | ORAL_TABLET | Freq: Once | ORAL | Status: DC

## 2013-03-04 MED ORDER — FENTANYL 75 MCG/HR TD PT72: 1.0000 | MEDICATED_PATCH | TRANSDERMAL | Status: DC

## 2013-03-04 NOTE — Progress Notes (Signed)
Physical Therapy Treatment Patient Details Name: Samuel [name] MRN: [id number] DOB: Oct 18, 1955 Today's Date: 03/04/2013 Time: 2536-6440 PT Time Calculation (min): 25 min  PT Assessment / Plan / Recommendation  History of Present Illness The patient is a 57 y.o. year-old male admitted 7/31 with AMS and fever thought 2/2 UTI and/or decubitus ulcer treated initially with vancomycin and pip/tazo.  SCr was 0.68 on admission and increased to 2.16 on 08/02.     PT Comments   Pt cognition limits ability to participate in mobility and needs frequent cueing to stay on task.    Follow Up Recommendations  SNF;Supervision/Assistance - 24 hour     Does the patient have the potential to tolerate intense rehabilitation     Barriers to Discharge        Equipment Recommendations  None recommended by PT    Recommendations for Other Services    Frequency Min 2X/week   Progress towards PT Goals Progress towards PT goals: Progressing toward goals  Plan Current plan remains appropriate    Precautions / Restrictions Precautions Precautions: Fall Precaution Comments: pt with sacral decub and is a paraplegic Restrictions Weight Bearing Restrictions: No   Pertinent Vitals/Pain Denies pain    Mobility  Bed Mobility Bed Mobility: Rolling Right;Right Sidelying to Sit;Sitting - Scoot to [place] of Bed;Sit to Supine;Rolling Left Rolling Right: 1: +2 Total assist Rolling Right: Patient Percentage: 20% Right Sidelying to Sit: 1: +2 Total assist;With rails;HOB elevated Right Sidelying to Sit: Patient Percentage: 10% Sitting - Scoot to Edge of Bed: 1: +2 Total assist Sitting - Scoot to Edge of Bed: Patient Percentage: 0% Sit to Supine: 1: +2 Total assist Sit to Supine: Patient Percentage: 30% Details for Bed Mobility Assistance: pt needs max cueing to stay on task and for safe technique.  pt seems to reach for therapists at times and needs cueing to use rail.   Transfers Transfers: Not  assessed Ambulation/Gait Ambulation/Gait Assistance: Not tested (comment) Stairs: No Wheelchair Mobility Wheelchair Mobility: No    Exercises     PT Diagnosis:    PT Problem List:   PT Treatment Interventions:     PT Goals (current goals can now be found in the care plan section) Acute Rehab PT Goals Time For Goal Achievement: 03/15/13 Potential to Achieve Goals: Fair  Visit Information  Last PT Received On: 03/04/13 Assistance Needed: +2 History of Present Illness: The patient is a 57 y.o. year-old male admitted 7/31 with AMS and fever thought 2/2 UTI and/or decubitus ulcer treated initially with vancomycin and pip/tazo.  SCr was 0.68 on admission and increased to 2.16 on 08/02.      Subjective Data  Subjective: I'm looking at her belly button.     Cognition  Cognition Arousal/Alertness: Awake/alert Behavior During Therapy: WFL for tasks assessed/performed Overall Cognitive Status: No family/caregiver present to determine baseline cognitive functioning General Comments: Pt self distracts and requires mod verbal cues to maintain sustained attention; pt. providing contradictory info. re: PLOF    Balance  Balance Balance Assessed: Yes Static Sitting Balance Static Sitting - Balance Support: Bilateral upper extremity supported Static Sitting - Level of Assistance: 3: Mod assist;5: Stand by assistance Static Sitting - Comment/# of Minutes: pending attention and cognition pt fluctautes between S and ModA to maintain sitting balance.    End of Session PT - End of Session Activity Tolerance: Patient tolerated treatment well Patient left: in bed;with call bell/phone within reach;with bed alarm set Nurse Communication: Mobility status   GP  Sunny Schlein, [place] [phone] 03/04/2013, 12:12 PM

## 2013-03-04 NOTE — Progress Notes (Signed)
Patient was discharged to [facility] with ambulance. Report was called prior to discharge. Patient was discharged with foley and colostomy. Patient was given his belongings. Patient was stable upon discharge.

## 2013-03-04 NOTE — Progress Notes (Signed)
82M with nonoliguric AKI 2/2 suspected ATN [baseline SCr 0.6], chronic indwelling foley after remote hx/o spinal cord injury, colostomy, BPAD, seizures.   Subjective:  No events overnight.  Feels well.  No complaints. Eating and drinking well.  Wants to eat shrimp.  08/07 0701 - 08/08 0700 In: 2140 [P.O.:2040] Out: 4615 [Urine:4130; Stool:485]  Filed Weights   03/01/13 0440 03/02/13 2038 03/03/13 2028  Weight: 116 kg (255 lb 11.7 oz) 117.5 kg (259 lb 0.7 oz) 117.68 kg (259 lb 7 oz)    Current meds: reviewed Current Labs: reviewed    Physical Exam:  Blood pressure 165/65, pulse 74, temperature 98.6 F (37 C), temperature source Oral, resp. rate 20, height 5\' 4"  (1.626 m), weight 117.68 kg (259 lb 7 oz), SpO2 96.00%. NAD RRR CTAB, nl wob Abd s/nt/nd Trace RLE edema   Assessment/Plan 1. AKI, suspected from ATN: Pt with good UOP and improving SCr. OK with discharge and close monitoring of renal function at SNF.  Have arranged f/u with me in 2 weeks in clinic.   2. Hypokalemia: will rec 40 K before dc 3. HTN: now on amlodipine, will eval in clinic.    Sabra Heck MD 03/04/2013, 12:04 PM   Recent Labs Lab 03/01/13 0414 03/02/13 1703 03/03/13 0539 03/04/13 0425  NA 137 139 141 142  K 3.4* 3.3* 2.7* 3.5  CL 107 106 108 108  CO2 17* 17* 21 21  GLUCOSE 98 108* 91 92  BUN 40* 40* 41* 43*  CREATININE 5.47* 5.24* 5.05* 4.61*  CALCIUM 8.1* 8.3* 8.0* 8.1*  PHOS  --  5.3* 5.7*  --     Recent Labs Lab 02/26/13 0400 02/27/13 0817 03/01/13 0414  WBC 9.7 7.1 7.2  HGB 9.6* 9.4* 8.9*  HCT 30.6* 29.3* 26.7*  MCV 79.3 77.9* 75.2*  PLT 276 237 217    Current Facility-Administered Medications  Medication Dose Route Frequency Provider Last Rate Last Dose  . acetaminophen (TYLENOL) tablet 650 mg  650 mg Oral Q6H PRN Lonia Blood, MD   650 mg at 03/02/13 1813  . amLODipine (NORVASC) tablet 5 mg  5 mg Oral QHS Arita Miss, MD   5 mg at 03/03/13 2317  . atorvastatin  (LIPITOR) tablet 10 mg  10 mg Oral Daily Eduard Clos, MD   10 mg at 03/04/13 1003  . baclofen (LIORESAL) tablet 10 mg  10 mg Oral QID PRN Eduard Clos, MD   10 mg at 03/03/13 2317  . feeding supplement (PRO-STAT SUGAR FREE 64) liquid 30 mL  30 mL Oral BID WC Ailene Ards, RD   30 mL at 03/01/13 1742  . fentaNYL (DURAGESIC - dosed mcg/hr) patch 75 mcg  75 mcg Transdermal Q72H Eduard Clos, MD   75 mcg at 03/02/13 2301  . folic acid (FOLVITE) tablet 1 mg  1 mg Oral Daily Eduard Clos, MD   1 mg at 03/04/13 1003  . lactulose (CHRONULAC) 10 GM/15ML solution 20 g  20 g Oral QID Eduard Clos, MD   20 g at 03/04/13 1003  . lamoTRIgine (LAMICTAL) tablet 100 mg  100 mg Oral BID Russella Dar, NP   100 mg at 03/04/13 1003  . multivitamin with minerals tablet 1 tablet  1 tablet Oral Daily Tonye Becket, RD   1 tablet at 03/04/13 1003  . nitroGLYCERIN (NITROSTAT) SL tablet 0.4 mg  0.4 mg Sublingual Q5 min PRN Eduard Clos, MD      .  ondansetron (ZOFRAN-ODT) disintegrating tablet 4 mg  4 mg Oral Q8H PRN Leda Gauze, NP   4 mg at 03/02/13 2037  . oxyCODONE (Oxy IR/ROXICODONE) immediate release tablet 10 mg  10 mg Oral TID PRN Russella Dar, NP      . promethazine (PHENERGAN) suppository 25 mg  25 mg Rectal Q6H PRN Leda Gauze, NP      . sodium bicarbonate tablet 650 mg  650 mg Oral TID Arita Miss, MD   650 mg at 03/04/13 1003  . vitamin B-12 (CYANOCOBALAMIN) tablet 1,000 mcg  1,000 mcg Oral Daily Eduard Clos, MD   1,000 mcg at 03/04/13 1003  . Warfarin - Pharmacist Dosing Inpatient   Does not apply q1800 Dennie Fetters, [facility]      . zinc sulfate capsule 220 mg  220 mg Oral Daily Eduard Clos, MD   220 mg at 03/04/13 1003

## 2013-03-04 NOTE — Discharge Summary (Addendum)
Physician Discharge Summary  Samuel [name] WUJ:[id number] DOB: 11-03-1955 DOA: 02/24/2013  PCP: Terald Sleeper, MD  Admit date: 02/24/2013 Discharge date: 03/04/2013  Time spent: Greater than 30 minutes  Recommendations for Outpatient Follow-up:  1. Dr. Baltazar Najjar, M.D. at SNF in 3 days. 2. Followup repeat labs (CBC & renal panel) on 03/07/13. 3. Dr. Sabra Heck, nephrology on 03/18/13 at 9:30 AM. 4. Dr. Cammy Copa, orthopedics: Please make followup appointment ASAP. 5. Completed Coumadin-discontinued. 6. Monitor renal functions closely and titrate dosage of following medications as deemed necessary: Lamictal, Lyrica & Cymbalta.  Discharge Diagnoses:  Principal Problem:   Sepsis Active Problems:   Bipolar disorder, unspecified   Hepatitis C   Chronic pain   UTI (lower urinary tract infection)   Encephalopathy acute   Coagulopathy   HTN (hypertension)   ARF (acute renal failure)   Discharge Condition: Improved & Stable  Diet recommendation: Heart healthy diet  Filed Weights   03/01/13 0440 03/02/13 2038 03/03/13 2028  Weight: 116 kg (255 lb 11.7 oz) 117.5 kg (259 lb 0.7 oz) 117.68 kg (259 lb 7 oz)    History of present illness:  57 year old paraplegic male from [facility] with history of hypertension, decubitus ulcer followed at the wound clinic at [facility], chronic Foley catheter, history of hep C, prior alcohol and polysubstance abuse, diverting colostomy, chronic pain, left hip disarticulation with flap surgery, hyperlipidemia, bipolar disorder and seizures, recent lateral condyle repair for rt knee fracture was brought to the hospital with altered mental status and noted to have a fever of 101.6. Patient admitted to stepdown for sepsis.  Hospital Course:   Sepsis  - Possibly due to UTI - Vanc and Zosyn started in the ER for empiric tx of UTI  - All antibiotics were discontinued after 5 days given worsening renal failure.  - Blood cultures neg and  urine cx growing multiple morphotype's  - Acute renal failure/ATN  -Likely due to ATN. granular casts noted in the urine. Admitting creatinine was normal at 0.68. Creatinine worsened to 2.16, 48 hours after admission. Peak creatinine was 5.47 on 8/5. -Likely complicated by vancomycin which was dc'd 8/3- vanc level 24 hrs later was still elevated at 35.7  -Initially disontinued oxycodone, Cymbalta , Lamictal and lyrica due to renal failure . Oxycodone and Lamictal resumed at a lower dose given ongoing pain.  -Renal added oral bicarb 8/4 . CO2 improved to 21 today  - Creatinine has gradually started to improve. - Nephrology has cleared patient for discharge with close followup of her renal function is at [facility] and have arrange for outpatient followup with them. They recommend continuing bicarbonate until followup with them. - Obviously avoid all NSAIDs, ACE inhibitors and ARB's. - No hydronephrosis on renal ultrasound.  Hypokalemia  Potassium of 3.5 today. Providing the dose of potassium prior to discharge. Follow BMP closely as OP.  Acute Respiratory Failure with hypoxia  -CXR 8/5 with pulm vascular congestion, and ?infiltrate in LLL but no fever or leukocytosis  . Lungs sound clear to auscultation today and patient maintaining sats on room.  -Cont IS and flutter valve  - Resolved.  Acute Encephalopathy /- h/o hepatic encephalopathy??  Likely secondary to uremia vs medications ( oxycodone, cymbalta, lyrica, lamictal)  -head CT negative on admission  -has been refusing Lactulose this admission  -Mental status at baseline now. Oxycodone and Lamictal resumed at lower doses. We'll also resume Lyrica at low dose  adjusting to renal functions. These medications can be titrated further  as outpatient based on renal functions and improvement.  Coagulopathy  -on Coumadin for DVT prophylaxis after left femoral surgery 7/8. Apparently was supposed to be on anticoagulation for 4 weeks which has been  completed and hence we'll discontinue. This has been confirmed with orthopedics Dr. August Saucer.  HTN  Hypotension on presentation. BP now elevated. Will increase amlodipine dose. Clonidine discontinued.  Sacral decub  -Monitored by [facility] wound clinic as outpatient   Bipolar disorder, unspecified  -Medications have been adjusted to renal insufficiency.  Hepatitis C  -stable   Chronic pain  -On fentanyl patch. Prn oxycodone and lamictal   Paraplegia/Colostomy and chronic Foley catheter   History of seizures - Patient's Lamictal and Lyrica had been held due to worsening renal insufficiency and altered mental status. Same have been resumed at lower doses adjusting for renal functions. These can be further titrated as outpatient.   Procedures:  Chronic indwelling Foley catheter.  Chronic Colostomy  Consultations:  Nephrology  Discharge Exam:  Complaints: Patient denies complaints. Denies pain.  Filed Vitals:   03/03/13 1400 03/03/13 1800 03/03/13 2028 03/04/13 0436  BP: 162/84 159/82 166/71 165/65  Pulse: 60 68 80 74  Temp: 98.6 F (37 C) 98.8 F (37.1 C) 99.1 F (37.3 C) 98.6 F (37 C)  TempSrc: Oral Oral Oral Oral  Resp: 20 20 20 20   Height:      Weight:   117.68 kg (259 lb 7 oz)   SpO2: 98% 98% 94% 96%    General: Middle-aged male lying in bed in no acute distress  HEENT: No pallor, moist oral mucosa  Chest: Clear to auscultation bilaterally, no added sounds  CVS: Normal S1 and S2, no murmurs rub or gallop  Abdomen: Soft, nontender, nondistended, bowel sounds present, left-sided colostomy, Foley in place draining clear urine  Extremities: Has left stump, paraplegic  CNS: AAO x3   Discharge Instructions      Discharge Orders   Future Orders Complete By Expires     Call MD for:  difficulty breathing, headache or visual disturbances  As directed     Call MD for:  extreme fatigue  As directed     Call MD for:  persistant dizziness or light-headedness  As  directed     Call MD for:  persistant nausea and vomiting  As directed     Call MD for:  redness, tenderness, or signs of infection (pain, swelling, redness, odor or green/yellow discharge around incision site)  As directed     Call MD for:  severe uncontrolled pain  As directed     Call MD for:  temperature >100.4  As directed     Diet - low sodium heart healthy  As directed     Increase activity slowly  As directed         Medication List    STOP taking these medications       cloNIDine 0.1 MG tablet  Commonly known as:  CATAPRES     sulfamethoxazole-trimethoprim 800-160 MG per tablet  Commonly known as:  BACTRIM DS     warfarin 4 MG tablet  Commonly known as:  COUMADIN      TAKE these medications       acetaminophen 325 MG tablet  Commonly known as:  TYLENOL  Take 2 tablets (650 mg total) by mouth every 6 (six) hours as needed for pain (Mild pain).     amLODipine 5 MG tablet  Commonly known as:  NORVASC  Take 1 tablet (  5 mg total) by mouth at bedtime.     atorvastatin 10 MG tablet  Commonly known as:  LIPITOR  Take 10 mg by mouth daily.     baclofen 10 MG tablet  Commonly known as:  LIORESAL  Take 10 mg by mouth 4 (four) times daily as needed.     clonazePAM 0.5 MG tablet  Commonly known as:  KLONOPIN  Take 1 tablet (0.5 mg total) by mouth every 8 (eight) hours as needed for anxiety. For anxiety     clonazePAM 1 MG tablet  Commonly known as:  KLONOPIN  Take 1 tablet (1 mg total) by mouth 3 (three) times daily.     DULoxetine 30 MG capsule  Commonly known as:  CYMBALTA  Take 1 capsule (30 mg total) by mouth daily.     feeding supplement Liqd  Take 30 mLs by mouth 2 (two) times daily with a meal.     fentaNYL 75 MCG/HR  Commonly known as:  DURAGESIC - dosed mcg/hr  Place 1 patch (75 mcg total) onto the skin every 3 (three) days. *Remove old Patch*Rotate Site*     folic acid 1 MG tablet  Commonly known as:  FOLVITE  Take 1 mg by mouth daily.      lactulose 10 GM/15ML solution  Commonly known as:  CHRONULAC  Take 30 mLs by mouth 4 (four) times daily.     lamoTRIgine 200 MG tablet  Commonly known as:  LAMICTAL  Take 0.5 tablets (100 mg total) by mouth 2 (two) times daily.     Melatonin 3 MG Caps  Take 3 mg by mouth at bedtime.     multivitamin with minerals Tabs tablet  Take 1 tablet by mouth daily.     nitroGLYCERIN 0.4 MG SL tablet  Commonly known as:  NITROSTAT  Place 0.4 mg under the tongue every 5 (five) minutes as needed for chest pain. x3 doses as needed for chest pain     oxyCODONE 15 MG immediate release tablet  Commonly known as:  ROXICODONE  Take 1 tablet (15 mg total) by mouth 3 (three) times daily as needed for pain. Moderate-severe pain.     pregabalin 75 MG capsule  Commonly known as:  LYRICA  Take 1 capsule (75 mg total) by mouth 2 (two) times daily.     sodium bicarbonate 650 MG tablet  Take 1 tablet (650 mg total) by mouth 3 (three) times daily.     vitamin B-12 1000 MCG tablet  Commonly known as:  CYANOCOBALAMIN  Take 1,000 mcg by mouth daily.     vitamin C 500 MG tablet  Commonly known as:  ASCORBIC ACID  Take 500 mg by mouth 2 (two) times daily.     zinc sulfate 220 MG capsule  Take 220 mg by mouth daily.       Follow-up Information   Follow up with Arita Miss, MD On 03/18/2013. (9:30am)    Contact information:   [address] [place] [address] [phone]        The results of significant diagnostics from this hospitalization (including imaging, microbiology, ancillary and laboratory) are listed below for reference.    Significant Diagnostic Studies: Ct Head Wo Contrast  02/24/2013   *RADIOLOGY REPORT*  Clinical Data: Altered mental status, lethargy, the patient is on Coumadin  CT HEAD WITHOUT CONTRAST  Technique:  Contiguous axial images were obtained from the base of the skull through the vertex without contrast.  Comparison: None.  Findings: Limited exam because of  oblique acquisition.  No definite acute intracranial hemorrhage, mass lesion, infarction, focal edema, or mass effect.  No midline shift, herniation, hydrocephalus, or extra-axial fluid collection.  Cisterns are patent.  No cerebellar abnormality.  Mastoids and sinuses clear.  IMPRESSION: No acute intracranial process   Original Report Authenticated By: Judie Petit. Miles Costain, M.D.   US Renal  02/28/2013   *RADIOLOGY REPORT*  Clinical Data: Acute renal failure  RENAL/URINARY TRACT ULTRASOUND COMPLETE  Comparison:  No similar prior study is available for comparison.  Findings:  Right Kidney:  12.6 cm. No hydronephrosis or focal mass.  Left Kidney:  12.5 cm. No hydronephrosis or focal mass.  Bladder:  Bladder is decompressed with a Foley catheter in place.  Fine detail is obscured by patient body habitus.  IMPRESSION: Allowing for degradation due to patient body habitus, no focal acute finding or hydronephrosis.   Original Report Authenticated By: Christiana Pellant, M.D.   Dg Chest Port 1 View  03/01/2013   *RADIOLOGY REPORT*  Clinical Data: Crackles at left base, acute renal failure, hypertension, hyperlipidemia, history spinal cord injury and paraplegia  PORTABLE CHEST - 1 VIEW  Comparison: Portable exam 0936 hours compared to 02/24/2013  Findings: Low lung volumes. Enlargement of cardiac silhouette with pulmonary vascular congestion. Elevation of right diaphragm, chronic. Right basilar atelectasis. Question mild infiltrate versus atelectasis in retrocardiac left lower lobe. Upper lungs clear. Minimal central peribronchial thickening. No gross pleural effusion or pneumothorax.  IMPRESSION: Enlargement of cardiac silhouette with pulmonary vascular congestion. Bronchitic changes with right basilar atelectasis and question mild atelectasis versus infiltrate in left lower lobe.   Original Report Authenticated By: Ulyses Southward, M.D.   Dg Chest Portable 1 View  02/24/2013   *RADIOLOGY REPORT*  Clinical Data: Fever.  PORTABLE CHEST -  1 VIEW  Comparison: Chest x-ray 02/01/2013.  Findings: Lung volumes are exceedingly low.  Elevation of the right hemidiaphragm.  Lung bases are poorly visualized.  Areas of atelectasis and/or consolidation are possible in both lung bases, particularly on the left, but these regions are poorly demonstrated on today's supine lordotic under penetrated AP film obtained at low lung volumes.  The possibility of pleural effusions is also difficult to exclude on today's film, but no large effusions are noted.  No evidence of pulmonary edema.  Cardiomegaly is unchanged. The patient is rotated to the left on today's exam, resulting in distortion of the mediastinal contours and reduced diagnostic sensitivity and specificity for mediastinal pathology.  IMPRESSION: 1.  Very limited examination demonstrating extremely low lung volumes and chronic elevation of the right hemidiaphragm.  If there is clinical concern for pneumonia, standing PA and lateral chest radiograph could provide much better diagnostic information.  The patient should also be counseled to take a deep breath prior to obtaining a subsequent films.   Original Report Authenticated By: Trudie Reed, M.D.    Microbiology: Recent Results (from the past 240 hour(s))  CULTURE, BLOOD (ROUTINE X 2)     Status: None   Collection Time    02/24/13  4:35 PM      Result Value Range Status   Specimen Description BLOOD ARM LEFT   Final   Special Requests BOTTLES DRAWN AEROBIC AND ANAEROBIC 4CCBLUE 5CCRED   Final   Culture  Setup Time     Final   Value: 02/25/2013 01:20     Performed at [facility]   Culture     Final   Value: NO GROWTH 5 DAYS  Performed at [facility]   Report Status 03/03/2013 FINAL   Final  CULTURE, BLOOD (ROUTINE X 2)     Status: None   Collection Time    02/24/13  4:35 PM      Result Value Range Status   Specimen Description BLOOD ARM RIGHT   Final   Special Requests BOTTLES DRAWN AEROBIC ONLY 5CC   Final    Culture  Setup Time     Final   Value: 02/25/2013 01:20     Performed at [facility]   Culture     Final   Value: NO GROWTH 5 DAYS     Performed at [facility]   Report Status 03/03/2013 FINAL   Final  URINE CULTURE     Status: None   Collection Time    02/24/13  5:41 PM      Result Value Range Status   Specimen Description URINE, CATHETERIZED   Final   Special Requests NONE   Final   Culture  Setup Time 02/24/2013 19:46   Final   Colony Count >=100,000 COLONIES/ML   Final   Culture     Final   Value: Multiple bacterial morphotypes present, none predominant. Suggest appropriate recollection if clinically indicated.   Report Status 02/26/2013 FINAL   Final  MRSA PCR SCREENING     Status: Abnormal   Collection Time    02/24/13  8:44 PM      Result Value Range Status   MRSA by PCR POSITIVE (*) NEGATIVE Final   Comment:            The GeneXpert MRSA Assay (FDA     approved for NASAL specimens     only), is one component of a     comprehensive MRSA colonization     surveillance program. It is not     intended to diagnose MRSA     infection nor to guide or     monitor treatment for     MRSA infections.     RESULT CALLED TO, READ BACK BY AND VERIFIED WITH:     Oren Bracket RN [id number] 02/24/13 A [facility]  WOUND CULTURE     Status: None   Collection Time    02/25/13  5:10 AM      Result Value Range Status   Specimen Description WOUND SACRAL   Final   Special Requests NONE   Final   Gram Stain     Final   Value: FEW WBC PRESENT, PREDOMINANTLY PMN     NO SQUAMOUS EPITHELIAL CELLS SEEN     FEW GRAM NEGATIVE RODS   Culture     Final   Value: MULTIPLE ORGANISMS PRESENT, NONE PREDOMINANT NO STAPHYLOCOCCUS AUREUS ISOLATED NO GROUP A STREP (S.PYOGENES) ISOLATED   Report Status 02/27/2013 FINAL   Final     Labs: Basic Metabolic Panel:  Recent Labs Lab 02/28/13 0430 03/01/13 0414 03/02/13 1703 03/03/13 0539 03/04/13 0425  NA 138 137 139 141 142  K 3.8 3.4* 3.3* 2.7*  3.5  CL 105 107 106 108 108  CO2 17* 17* 17* 21 21  GLUCOSE 92 98 108* 91 92  BUN 35* 40* 40* 41* 43*  CREATININE 4.69* 5.47* 5.24* 5.05* 4.61*  CALCIUM 7.8* 8.1* 8.3* 8.0* 8.1*  PHOS  --   --  5.3* 5.7*  --    Liver Function Tests:  Recent Labs Lab 03/02/13 1703 03/03/13 0539  ALBUMIN 2.2* 2.2*   No results found  for this basename: LIPASE, AMYLASE,  in the last 168 hours  Recent Labs Lab 02/28/13 1438  AMMONIA 29   CBC:  Recent Labs Lab 02/26/13 0400 02/27/13 0817 03/01/13 0414  WBC 9.7 7.1 7.2  HGB 9.6* 9.4* 8.9*  HCT 30.6* 29.3* 26.7*  MCV 79.3 77.9* 75.2*  PLT 276 237 217   Cardiac Enzymes: No results found for this basename: CKTOTAL, CKMB, CKMBINDEX, TROPONINI,  in the last 168 hours BNP: BNP (last 3 results) No results found for this basename: PROBNP,  in the last 8760 hours CBG:  Recent Labs Lab 03/03/13 1123 03/03/13 1604 03/03/13 2028 03/04/13 0757 03/04/13 1209  GLUCAP 144* 111* 133* 87 143*    Additional labs:  VBG on admission: PH 7.38, PCO2 38, PO2 27, bicarbonate 22 and oxygen saturation 40.  INR 8/8:1.53  Rheumatoid factor: <10, C3: 113, C4: 29  Urine sodium 46, urine creatinine 29.91, urine osmolarity 143   Signed:  Ericah Scotto  [facility] 03/04/2013, 2:16 PM

## 2013-03-04 NOTE — Progress Notes (Signed)
Ok per MD for d/c today back to [facility] via EMS. Patient is agreeable to d/c; ok per Marylene Land at [place] for return to facility.  Patient declined CSW's offer to contact his family; states "I'll call my grandmother."  Nursing notified and will call report.  CSW signing off.  Lorri Frederick. [place]  [phone]

## 2013-03-07 ENCOUNTER — Other Ambulatory Visit: Payer: Self-pay | Admitting: Geriatric Medicine

## 2013-03-07 MED ORDER — OXYCODONE HCL 15 MG PO TABS: 15.0000 mg | ORAL_TABLET | Freq: Three times a day (TID) | ORAL | Status: DC | PRN

## 2013-03-07 MED ORDER — FENTANYL 75 MCG/HR TD PT72: 1.0000 | MEDICATED_PATCH | TRANSDERMAL | Status: DC

## 2013-03-15 ENCOUNTER — Non-Acute Institutional Stay (SKILLED_NURSING_FACILITY): Payer: Medicare Other | Admitting: Internal Medicine

## 2013-03-15 DIAGNOSIS — F319 Bipolar disorder, unspecified: Secondary | ICD-10-CM

## 2013-03-15 DIAGNOSIS — L8993 Pressure ulcer of unspecified site, stage 3: Secondary | ICD-10-CM

## 2013-03-15 DIAGNOSIS — N179 Acute kidney failure, unspecified: Secondary | ICD-10-CM

## 2013-03-15 DIAGNOSIS — L8990 Pressure ulcer of unspecified site, unspecified stage: Secondary | ICD-10-CM
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
## 2013-04-04 ENCOUNTER — Other Ambulatory Visit: Payer: Self-pay | Admitting: *Deleted

## 2013-04-04 MED ORDER — FENTANYL 75 MCG/HR TD PT72: 1.0000 | MEDICATED_PATCH | TRANSDERMAL | Status: DC

## 2013-04-12 ENCOUNTER — Non-Acute Institutional Stay (SKILLED_NURSING_FACILITY): Payer: PRIVATE HEALTH INSURANCE | Admitting: Internal Medicine

## 2013-04-12 DIAGNOSIS — L8990 Pressure ulcer of unspecified site, unspecified stage: Secondary | ICD-10-CM

## 2013-04-12 DIAGNOSIS — I10 Essential (primary) hypertension: Secondary | ICD-10-CM

## 2013-04-12 DIAGNOSIS — L8993 Pressure ulcer of unspecified site, stage 3: Secondary | ICD-10-CM

## 2013-04-12 DIAGNOSIS — N179 Acute kidney failure, unspecified: Secondary | ICD-10-CM
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
## 2013-04-21 ENCOUNTER — Other Ambulatory Visit: Payer: Self-pay | Admitting: *Deleted

## 2013-04-21 MED ORDER — CLONAZEPAM 0.5 MG PO TABS: ORAL_TABLET | ORAL | Status: DC

## 2013-04-21 MED ORDER — OXYCODONE HCL 15 MG PO TABS: ORAL_TABLET | ORAL | Status: DC

## 2013-05-03 ENCOUNTER — Other Ambulatory Visit: Payer: Self-pay | Admitting: *Deleted

## 2013-05-03 MED ORDER — FENTANYL 75 MCG/HR TD PT72: 1.0000 | MEDICATED_PATCH | TRANSDERMAL | Status: DC
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
## 2013-05-10 ENCOUNTER — Non-Acute Institutional Stay (SKILLED_NURSING_FACILITY): Payer: PRIVATE HEALTH INSURANCE | Admitting: Internal Medicine

## 2013-05-10 DIAGNOSIS — IMO0002 Reserved for concepts with insufficient information to code with codable children: Secondary | ICD-10-CM

## 2013-05-10 DIAGNOSIS — N179 Acute kidney failure, unspecified: Secondary | ICD-10-CM

## 2013-05-10 DIAGNOSIS — L8990 Pressure ulcer of unspecified site, unspecified stage: Secondary | ICD-10-CM

## 2013-05-10 DIAGNOSIS — G8220 Paraplegia, unspecified: Secondary | ICD-10-CM

## 2013-05-10 DIAGNOSIS — L8993 Pressure ulcer of unspecified site, stage 3: Secondary | ICD-10-CM
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
## 2013-05-23 ENCOUNTER — Other Ambulatory Visit: Payer: Self-pay | Admitting: *Deleted

## 2013-05-23 MED ORDER — CLONAZEPAM 1 MG PO TABS: ORAL_TABLET | ORAL | Status: DC

## 2013-05-30 ENCOUNTER — Other Ambulatory Visit: Payer: Self-pay | Admitting: *Deleted

## 2013-05-30 MED ORDER — PREGABALIN 75 MG PO CAPS: 75.0000 mg | ORAL_CAPSULE | Freq: Two times a day (BID) | ORAL | Status: DC

## 2013-06-06 ENCOUNTER — Other Ambulatory Visit: Payer: Self-pay | Admitting: *Deleted

## 2013-06-06 MED ORDER — FENTANYL 75 MCG/HR TD PT72: 75.0000 ug | MEDICATED_PATCH | TRANSDERMAL | Status: DC

## 2013-06-08 ENCOUNTER — Other Ambulatory Visit: Payer: Self-pay | Admitting: *Deleted

## 2013-06-08 MED ORDER — FENTANYL 75 MCG/HR TD PT72: 75.0000 ug | MEDICATED_PATCH | TRANSDERMAL | Status: DC

## 2013-06-13 ENCOUNTER — Other Ambulatory Visit: Payer: Self-pay

## 2013-06-13 MED ORDER — OXYCODONE HCL 15 MG PO TABS: ORAL_TABLET | ORAL | Status: DC

## 2013-06-14 ENCOUNTER — Non-Acute Institutional Stay (SKILLED_NURSING_FACILITY): Payer: PRIVATE HEALTH INSURANCE | Admitting: Internal Medicine

## 2013-06-14 DIAGNOSIS — L8990 Pressure ulcer of unspecified site, unspecified stage: Secondary | ICD-10-CM

## 2013-06-14 DIAGNOSIS — F319 Bipolar disorder, unspecified: Secondary | ICD-10-CM

## 2013-06-14 DIAGNOSIS — L8993 Pressure ulcer of unspecified site, stage 3: Secondary | ICD-10-CM

## 2013-06-14 DIAGNOSIS — I10 Essential (primary) hypertension: Secondary | ICD-10-CM
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
## 2013-06-21 ENCOUNTER — Other Ambulatory Visit: Payer: Self-pay

## 2013-06-21 MED ORDER — OXYCODONE HCL 15 MG PO TABS: ORAL_TABLET | ORAL | Status: DC

## 2013-07-12 ENCOUNTER — Non-Acute Institutional Stay (SKILLED_NURSING_FACILITY): Payer: PRIVATE HEALTH INSURANCE | Admitting: Internal Medicine

## 2013-07-12 DIAGNOSIS — L8993 Pressure ulcer of unspecified site, stage 3: Secondary | ICD-10-CM

## 2013-07-12 DIAGNOSIS — L8990 Pressure ulcer of unspecified site, unspecified stage: Secondary | ICD-10-CM
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
## 2013-08-05 ENCOUNTER — Other Ambulatory Visit: Payer: Self-pay | Admitting: *Deleted

## 2013-08-05 MED ORDER — OXYCODONE HCL 15 MG PO TABS: ORAL_TABLET | ORAL | Status: DC

## 2013-08-21 ENCOUNTER — Non-Acute Institutional Stay (SKILLED_NURSING_FACILITY): Payer: PRIVATE HEALTH INSURANCE | Admitting: Internal Medicine

## 2013-08-21 DIAGNOSIS — L8993 Pressure ulcer of unspecified site, stage 3: Secondary | ICD-10-CM

## 2013-08-21 DIAGNOSIS — L8990 Pressure ulcer of unspecified site, unspecified stage: Secondary | ICD-10-CM

## 2013-08-21 DIAGNOSIS — N319 Neuromuscular dysfunction of bladder, unspecified: Secondary | ICD-10-CM

## 2013-08-21 DIAGNOSIS — I10 Essential (primary) hypertension: Secondary | ICD-10-CM
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
## 2013-08-22 ENCOUNTER — Other Ambulatory Visit: Payer: Self-pay | Admitting: *Deleted

## 2013-08-22 MED ORDER — FENTANYL 75 MCG/HR TD PT72: 75.0000 ug | MEDICATED_PATCH | TRANSDERMAL | Status: DC

## 2013-08-28 ENCOUNTER — Inpatient Hospital Stay (HOSPITAL_COMMUNITY)
Admission: EM | Admit: 2013-08-28 | Discharge: 2013-09-05 | DRG: 480 | Disposition: A | Discharge status: Skilled Nursing Facility | Payer: PRIVATE HEALTH INSURANCE | Attending: Internal Medicine | Admitting: Internal Medicine

## 2013-08-28 ENCOUNTER — Emergency Department (HOSPITAL_COMMUNITY): Payer: PRIVATE HEALTH INSURANCE

## 2013-08-28 ENCOUNTER — Encounter (HOSPITAL_COMMUNITY): Payer: Self-pay | Admitting: Emergency Medicine

## 2013-08-28 DIAGNOSIS — E538 Deficiency of other specified B group vitamins: Secondary | ICD-10-CM | POA: Diagnosis present

## 2013-08-28 DIAGNOSIS — E785 Hyperlipidemia, unspecified: Secondary | ICD-10-CM | POA: Diagnosis present

## 2013-08-28 DIAGNOSIS — G546 Phantom limb syndrome with pain: Secondary | ICD-10-CM

## 2013-08-28 DIAGNOSIS — G8929 Other chronic pain: Secondary | ICD-10-CM | POA: Diagnosis present

## 2013-08-28 DIAGNOSIS — M1610 Unilateral primary osteoarthritis, unspecified hip: Secondary | ICD-10-CM | POA: Diagnosis present

## 2013-08-28 DIAGNOSIS — N179 Acute kidney failure, unspecified: Secondary | ICD-10-CM

## 2013-08-28 DIAGNOSIS — IMO0002 Reserved for concepts with insufficient information to code with codable children: Secondary | ICD-10-CM

## 2013-08-28 DIAGNOSIS — F319 Bipolar disorder, unspecified: Secondary | ICD-10-CM

## 2013-08-28 DIAGNOSIS — S78119A Complete traumatic amputation at level between unspecified hip and knee, initial encounter: Secondary | ICD-10-CM

## 2013-08-28 DIAGNOSIS — S72009A Fracture of unspecified part of neck of unspecified femur, initial encounter for closed fracture: Secondary | ICD-10-CM | POA: Diagnosis present

## 2013-08-28 DIAGNOSIS — N390 Urinary tract infection, site not specified: Secondary | ICD-10-CM | POA: Diagnosis present

## 2013-08-28 DIAGNOSIS — G9340 Encephalopathy, unspecified: Secondary | ICD-10-CM

## 2013-08-28 DIAGNOSIS — G547 Phantom limb syndrome without pain: Secondary | ICD-10-CM | POA: Diagnosis present

## 2013-08-28 DIAGNOSIS — D689 Coagulation defect, unspecified: Secondary | ICD-10-CM

## 2013-08-28 DIAGNOSIS — N319 Neuromuscular dysfunction of bladder, unspecified: Secondary | ICD-10-CM | POA: Diagnosis present

## 2013-08-28 DIAGNOSIS — E876 Hypokalemia: Secondary | ICD-10-CM

## 2013-08-28 DIAGNOSIS — L89109 Pressure ulcer of unspecified part of back, unspecified stage: Secondary | ICD-10-CM | POA: Diagnosis present

## 2013-08-28 DIAGNOSIS — D72829 Elevated white blood cell count, unspecified: Secondary | ICD-10-CM | POA: Diagnosis present

## 2013-08-28 DIAGNOSIS — Z993 Dependence on wheelchair: Secondary | ICD-10-CM

## 2013-08-28 DIAGNOSIS — D62 Acute posthemorrhagic anemia: Secondary | ICD-10-CM | POA: Diagnosis present

## 2013-08-28 DIAGNOSIS — E8809 Other disorders of plasma-protein metabolism, not elsewhere classified: Secondary | ICD-10-CM | POA: Diagnosis present

## 2013-08-28 DIAGNOSIS — B1920 Unspecified viral hepatitis C without hepatic coma: Secondary | ICD-10-CM

## 2013-08-28 DIAGNOSIS — Z6841 Body Mass Index (BMI) 40.0 and over, adult: Secondary | ICD-10-CM

## 2013-08-28 DIAGNOSIS — L8994 Pressure ulcer of unspecified site, stage 4: Secondary | ICD-10-CM | POA: Diagnosis present

## 2013-08-28 DIAGNOSIS — A4902 Methicillin resistant Staphylococcus aureus infection, unspecified site: Secondary | ICD-10-CM | POA: Diagnosis present

## 2013-08-28 DIAGNOSIS — F1010 Alcohol abuse, uncomplicated: Secondary | ICD-10-CM | POA: Diagnosis present

## 2013-08-28 DIAGNOSIS — G8220 Paraplegia, unspecified: Secondary | ICD-10-CM | POA: Diagnosis present

## 2013-08-28 DIAGNOSIS — K219 Gastro-esophageal reflux disease without esophagitis: Secondary | ICD-10-CM

## 2013-08-28 DIAGNOSIS — Z933 Colostomy status: Secondary | ICD-10-CM

## 2013-08-28 DIAGNOSIS — D638 Anemia in other chronic diseases classified elsewhere: Secondary | ICD-10-CM | POA: Diagnosis present

## 2013-08-28 DIAGNOSIS — W050XXA Fall from non-moving wheelchair, initial encounter: Secondary | ICD-10-CM | POA: Diagnosis present

## 2013-08-28 DIAGNOSIS — S72141A Displaced intertrochanteric fracture of right femur, initial encounter for closed fracture: Secondary | ICD-10-CM

## 2013-08-28 DIAGNOSIS — E43 Unspecified severe protein-calorie malnutrition: Secondary | ICD-10-CM | POA: Diagnosis present

## 2013-08-28 DIAGNOSIS — G4700 Insomnia, unspecified: Secondary | ICD-10-CM

## 2013-08-28 DIAGNOSIS — E669 Obesity, unspecified: Secondary | ICD-10-CM | POA: Diagnosis present

## 2013-08-28 DIAGNOSIS — S72143A Displaced intertrochanteric fracture of unspecified femur, initial encounter for closed fracture: Principal | ICD-10-CM | POA: Diagnosis present

## 2013-08-28 DIAGNOSIS — Y33XXXS Other specified events, undetermined intent, sequela: Secondary | ICD-10-CM

## 2013-08-28 DIAGNOSIS — I10 Essential (primary) hypertension: Secondary | ICD-10-CM | POA: Diagnosis present

## 2013-08-28 DIAGNOSIS — F1910 Other psychoactive substance abuse, uncomplicated: Secondary | ICD-10-CM | POA: Diagnosis present

## 2013-08-28 DIAGNOSIS — Z87891 Personal history of nicotine dependence: Secondary | ICD-10-CM

## 2013-08-28 DIAGNOSIS — A419 Sepsis, unspecified organism: Secondary | ICD-10-CM

## 2013-08-28 LAB — CBC WITH DIFFERENTIAL/PLATELET
Basophils Absolute: 0 10*3/uL (ref 0.0–0.1)
Basophils Relative: 0 % (ref 0–1)
Eosinophils Absolute: 0.2 10*3/uL (ref 0.0–0.7)
Eosinophils Relative: 1 % (ref 0–5)
HEMATOCRIT: 37.9 % — AB (ref 39.0–52.0)
Hemoglobin: 12.7 g/dL — ABNORMAL LOW (ref 13.0–17.0)
Lymphocytes Relative: 9 % — ABNORMAL LOW (ref 12–46)
Lymphs Abs: 1.5 10*3/uL (ref 0.7–4.0)
MCH: 26.5 pg (ref 26.0–34.0)
MCHC: 33.5 g/dL (ref 30.0–36.0)
MCV: 79.1 fL (ref 78.0–100.0)
Monocytes Absolute: 1.6 10*3/uL — ABNORMAL HIGH (ref 0.1–1.0)
Monocytes Relative: 10 % (ref 3–12)
Neutro Abs: 12.9 10*3/uL — ABNORMAL HIGH (ref 1.7–7.7)
Neutrophils Relative %: 80 % — ABNORMAL HIGH (ref 43–77)
PLATELETS: 267 10*3/uL (ref 150–400)
RBC: 4.79 MIL/uL (ref 4.22–5.81)
RDW: 16.6 % — ABNORMAL HIGH (ref 11.5–15.5)
WBC: 16.2 10*3/uL — AB (ref 4.0–10.5)

## 2013-08-28 LAB — URINE MICROSCOPIC-ADD ON

## 2013-08-28 LAB — URINALYSIS, ROUTINE W REFLEX MICROSCOPIC
Bilirubin Urine: NEGATIVE
Glucose, UA: NEGATIVE mg/dL
Hgb urine dipstick: NEGATIVE
KETONES UR: NEGATIVE mg/dL
Nitrite: NEGATIVE
PH: 8 (ref 5.0–8.0)
Protein, ur: NEGATIVE mg/dL
Specific Gravity, Urine: 1.013 (ref 1.005–1.030)
Urobilinogen, UA: 0.2 mg/dL (ref 0.0–1.0)

## 2013-08-28 LAB — POCT I-STAT, CHEM 8
BUN: 7 mg/dL (ref 6–23)
CALCIUM ION: 1.15 mmol/L (ref 1.12–1.23)
CHLORIDE: 103 meq/L (ref 96–112)
Creatinine, Ser: 0.8 mg/dL (ref 0.50–1.35)
Glucose, Bld: 120 mg/dL — ABNORMAL HIGH (ref 70–99)
HCT: 42 % (ref 39.0–52.0)
Hemoglobin: 14.3 g/dL (ref 13.0–17.0)
Potassium: 3.5 mEq/L — ABNORMAL LOW (ref 3.7–5.3)
SODIUM: 141 meq/L (ref 137–147)
TCO2: 25 mmol/L (ref 0–100)

## 2013-08-28 LAB — CG4 I-STAT (LACTIC ACID): LACTIC ACID, VENOUS: 3.04 mmol/L — AB (ref 0.5–2.2)

## 2013-08-28 MED ORDER — DIAZEPAM 5 MG/ML IJ SOLN
5.0000 mg | Freq: Once | INTRAMUSCULAR | Status: DC
  Filled 2013-08-28: qty 2

## 2013-08-28 MED ORDER — DEXTROSE 5 % IV SOLN
1.0000 g | Freq: Once | INTRAVENOUS | Status: DC
  Filled 2013-08-28: qty 10

## 2013-08-28 MED ORDER — HYDROMORPHONE HCL PF 1 MG/ML IJ SOLN: 2.0000 mg | Freq: Once | INTRAMUSCULAR | Status: DC

## 2013-08-28 MED ORDER — HYDROMORPHONE HCL PF 1 MG/ML IJ SOLN
2.0000 mg | Freq: Once | INTRAMUSCULAR | Status: AC
  Administered 2013-08-28: 2 mg via INTRAVENOUS
  Filled 2013-08-28: qty 2

## 2013-08-28 MED ORDER — ONDANSETRON 4 MG PO TBDP
4.0000 mg | ORAL_TABLET | Freq: Once | ORAL | Status: AC
  Administered 2013-08-28: 4 mg via ORAL
  Filled 2013-08-28: qty 1

## 2013-08-28 MED ORDER — DIAZEPAM 5 MG/ML IJ SOLN
5.0000 mg | Freq: Once | INTRAMUSCULAR | Status: AC
  Administered 2013-08-28: 5 mg via INTRAVENOUS

## 2013-08-28 MED ORDER — SODIUM CHLORIDE 0.9 % IV BOLUS (SEPSIS)
1000.0000 mL | Freq: Once | INTRAVENOUS | Status: AC
  Administered 2013-08-28: 1000 mL via INTRAVENOUS

## 2013-08-28 MED ORDER — ACETAMINOPHEN 325 MG PO TABS
650.0000 mg | ORAL_TABLET | Freq: Once | ORAL | Status: AC
  Administered 2013-08-28: 650 mg via ORAL
  Filled 2013-08-28: qty 2

## 2013-08-28 MED ORDER — HYDROMORPHONE HCL PF 1 MG/ML IJ SOLN
1.0000 mg | Freq: Once | INTRAMUSCULAR | Status: AC
  Administered 2013-08-28: 1 mg via INTRAMUSCULAR
  Filled 2013-08-28: qty 1

## 2013-08-28 NOTE — ED Provider Notes (Signed)
Procedure note: Ultrasound Guided Peripheral IV Ultrasound guided 18 g peripheral 1.88 inch angiocath IV placement performed by me. Indications: Nursing unable to place IV. Details: The Right antecubital fossa and upper arm was evaluated with a multifrequency linear probe. Several patent brachial veins are noted. 1 attempts were made to cannulate a right vein under realtime US guidance with successful cannulation of the vein and catheter placement. There is return of non-pulsatile dark red blood. The patient tolerated the procedure well without complications. An ultrasound image is not archived.   Deno Etienne, MD 08/28/13 2214

## 2013-08-28 NOTE — ED Notes (Signed)
IV team at bedside 

## 2013-08-28 NOTE — ED Notes (Signed)
Pt refusing to be transported to x-ray at this time due to spasms to the left side- NP aware.

## 2013-08-28 NOTE — ED Notes (Signed)
NP attempted IV without success-  IV team paged X 2 .

## 2013-08-28 NOTE — ED Notes (Addendum)
Pt to ED from [facility] for evaluation of right leg pain after a fall at 1730 today.  Pt is L1 paraplegic- wheelchair bound, pt slid out of wheelchair and his right leg landed underneath him, left leg AKA.  Pt currently complaining of right leg pain, no obvious deformity noted.

## 2013-08-28 NOTE — ED Notes (Signed)
Dr Riley Lam given a copy of lactic acid results 3.04
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
## 2013-08-28 NOTE — ED Notes (Signed)
Spoke with radiology about pt transport

## 2013-08-28 NOTE — ED Notes (Signed)
Spoke with IV team  

## 2013-08-28 NOTE — ED Provider Notes (Signed)
Patient care assumed from Ignacia Felling, PA-C at shift change with imaging pending. Patient presents after a fall out of his wheelchair which caused his "right leg to land underneath him". He is a paraplegic. Xray today significant for intertrochanteric fx of R femur. Work up today also significant for findings c/w urosepsis. Blood cultures pending and Rocephin ordered to be administered. Have spoken with Dr. Erlinda Hong of Orthopedics who will evaluate patient in AM. Will consult [facility] for admit. Patient to be NPO after midnight.  Have spoken with Dr. Romilda Joy of [facility] who will admit patient. She will place admission orders. Patient at this time remains tachy to 117; fluid boluses in process. BP stable.   Results for orders placed during the hospital encounter of 08/28/13  CBC WITH DIFFERENTIAL      Result Value Range   WBC 16.2 (*) 4.0 - 10.5 K/uL   RBC 4.79  4.22 - 5.81 MIL/uL   Hemoglobin 12.7 (*) 13.0 - 17.0 g/dL   HCT 37.9 (*) 39.0 - 52.0 %   MCV 79.1  78.0 - 100.0 fL   MCH 26.5  26.0 - 34.0 pg   MCHC 33.5  30.0 - 36.0 g/dL   RDW 16.6 (*) 11.5 - 15.5 %   Platelets 267  150 - 400 K/uL   Neutrophils Relative % 80 (*) 43 - 77 %   Neutro Abs 12.9 (*) 1.7 - 7.7 K/uL   Lymphocytes Relative 9 (*) 12 - 46 %   Lymphs Abs 1.5  0.7 - 4.0 K/uL   Monocytes Relative 10  3 - 12 %   Monocytes Absolute 1.6 (*) 0.1 - 1.0 K/uL   Eosinophils Relative 1  0 - 5 %   Eosinophils Absolute 0.2  0.0 - 0.7 K/uL   Basophils Relative 0  0 - 1 %   Basophils Absolute 0.0  0.0 - 0.1 K/uL  URINALYSIS, ROUTINE W REFLEX MICROSCOPIC      Result Value Range   Color, Urine YELLOW  YELLOW   APPearance CLOUDY (*) CLEAR   Specific Gravity, Urine 1.013  1.005 - 1.030   pH 8.0  5.0 - 8.0   Glucose, UA NEGATIVE  NEGATIVE mg/dL   Hgb urine dipstick NEGATIVE  NEGATIVE   Bilirubin Urine NEGATIVE  NEGATIVE   Ketones, ur NEGATIVE  NEGATIVE mg/dL   Protein, ur NEGATIVE  NEGATIVE mg/dL   Urobilinogen, UA 0.2  0.0 - 1.0 mg/dL    Nitrite NEGATIVE  NEGATIVE   Leukocytes, UA LARGE (*) NEGATIVE  URINE MICROSCOPIC-ADD ON      Result Value Range   Squamous Epithelial / LPF RARE  RARE   WBC, UA 21-50  <3 WBC/hpf   RBC / HPF 0-2  <3 RBC/hpf   Bacteria, UA MANY (*) RARE   Crystals TRIPLE PHOSPHATE CRYSTALS (*) NEGATIVE  POCT I-STAT, CHEM 8      Result Value Range   Sodium 141  137 - 147 mEq/L   Potassium 3.5 (*) 3.7 - 5.3 mEq/L   Chloride 103  96 - 112 mEq/L   BUN 7  6 - 23 mg/dL   Creatinine, Ser 0.80  0.50 - 1.35 mg/dL   Glucose, Bld 120 (*) 70 - 99 mg/dL   Calcium, Ion 1.15  1.12 - 1.23 mmol/L   TCO2 25  0 - 100 mmol/L   Hemoglobin 14.3  13.0 - 17.0 g/dL   HCT 42.0  39.0 - 52.0 %  CG4 I-STAT (LACTIC ACID)  Result Value Range   Lactic Acid, Venous 3.04 (*) 0.5 - 2.2 mmol/L   Dg Chest 1 View  08/28/2013   CLINICAL DATA:  Fall.  EXAM: CHEST - 1 VIEW  COMPARISON:  Single view of the chest 03/01/2013.  FINDINGS: There is unchanged elevation of the right hemidiaphragm. Lungs appear clear. Heart size is upper normal. No pneumothorax or pleural effusion. Surgical clips in the left upper quadrant the abdomen are noted.  IMPRESSION: No acute finding.   Electronically Signed   By: Inge Rise M.D.   On: 08/28/2013 23:19   Dg Pelvis 1-2 Views  08/28/2013   CLINICAL DATA:  Fall.  Pain.  EXAM: PELVIS - 1-2 VIEW  COMPARISON:  None.  FINDINGS: There is partial visualization of a right intertrochanteric fracture. No other acute bony or joint abnormality is identified. Osteolysis of the femoral heads is worse on the left. There is destructive change of the left inferior pubic ramus with an appearance most compatible with chronic osteomyelitis.  IMPRESSION: Partial visualization of a right intertrochanteric fracture.  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   Electronically Signed   By: Inge Rise M.D.   On: 08/28/2013 23:18   Dg  Femur Right  08/28/2013   CLINICAL DATA:  Right leg pain in a paraplegic patient.  EXAM: RIGHT FEMUR - 2 VIEW  COMPARISON:  None.  FINDINGS: The patient has an intertrochanteric right hip fracture with subtrochanteric extension. There is osteolysis of the right femoral head with remodeling of the right acetabulum consistent with chronic change. Bones appear osteopenic. There is fixation of a remote healed distal femoral fracture. Advanced for age degenerative disease about the right knee is partially visualized.  IMPRESSION: Right intertrochanteric fracture with subtrochanteric extension.  Chronic appearing osteolysis and remodeling of the right hip and acetabulum.  Remote healed distal right femur fracture with fixation hardware in place.   Electronically Signed   By: Inge Rise M.D.   On: 08/28/2013 23:15   Dg Tibia/fibula Right  08/28/2013   CLINICAL DATA:  Status post fall.  Right lower leg pain.  EXAM: RIGHT TIBIA AND FIBULA - 2 VIEW  COMPARISON:  MRI right knee 12/31/2012.  FINDINGS: The patient has remote healed fractures of the distal right femur, proximal right tibia and fibula and distal right tibia and fibula. No acute bony or joint abnormality is identified. Bones are markedly osteopenic.  IMPRESSION: No acute finding.  Multiple remote fractures as above.   Electronically Signed   By: Inge Rise M.D.   On: 08/28/2013 23:16   Dg Shoulder Left  08/28/2013   CLINICAL DATA:  Fall.  Right shoulder pain.  EXAM: LEFT SHOULDER - 2+ VIEW  COMPARISON:  None.  FINDINGS: No acute bony or joint abnormality is identified. Degenerative disease about the right shoulder is seen. Image right lung and ribs are unremarkable.  IMPRESSION: No acute finding.   Electronically Signed   By: Inge Rise M.D.   On: 08/28/2013 23:17      Antonietta Breach, PA-C 08/29/13 0002

## 2013-08-28 NOTE — ED Notes (Signed)
Patient transported to X-ray 

## 2013-08-28 NOTE — ED Provider Notes (Addendum)
Patient fell out of his wheelchair tonight. Complains of right thigh pain since the event. Patient is alert Glasgow Coma Score 15. Appears comfortable. On exam obese Glasgow Coma Score 15. Right lower extremity externally rotated. Skin intact. Left lower extremity with AKA amputation. Patient noted to have urinary tract infection. Will be admitted to medical service with orthopedic consultation Results for orders placed during the hospital encounter of 08/28/13  CBC WITH DIFFERENTIAL      Result Value Range   WBC 16.2 (*) 4.0 - 10.5 K/uL   RBC 4.79  4.22 - 5.81 MIL/uL   Hemoglobin 12.7 (*) 13.0 - 17.0 g/dL   HCT 37.9 (*) 39.0 - 52.0 %   MCV 79.1  78.0 - 100.0 fL   MCH 26.5  26.0 - 34.0 pg   MCHC 33.5  30.0 - 36.0 g/dL   RDW 16.6 (*) 11.5 - 15.5 %   Platelets 267  150 - 400 K/uL   Neutrophils Relative % 80 (*) 43 - 77 %   Neutro Abs 12.9 (*) 1.7 - 7.7 K/uL   Lymphocytes Relative 9 (*) 12 - 46 %   Lymphs Abs 1.5  0.7 - 4.0 K/uL   Monocytes Relative 10  3 - 12 %   Monocytes Absolute 1.6 (*) 0.1 - 1.0 K/uL   Eosinophils Relative 1  0 - 5 %   Eosinophils Absolute 0.2  0.0 - 0.7 K/uL   Basophils Relative 0  0 - 1 %   Basophils Absolute 0.0  0.0 - 0.1 K/uL  URINALYSIS, ROUTINE W REFLEX MICROSCOPIC      Result Value Range   Color, Urine YELLOW  YELLOW   APPearance CLOUDY (*) CLEAR   Specific Gravity, Urine 1.013  1.005 - 1.030   pH 8.0  5.0 - 8.0   Glucose, UA NEGATIVE  NEGATIVE mg/dL   Hgb urine dipstick NEGATIVE  NEGATIVE   Bilirubin Urine NEGATIVE  NEGATIVE   Ketones, ur NEGATIVE  NEGATIVE mg/dL   Protein, ur NEGATIVE  NEGATIVE mg/dL   Urobilinogen, UA 0.2  0.0 - 1.0 mg/dL   Nitrite NEGATIVE  NEGATIVE   Leukocytes, UA LARGE (*) NEGATIVE  URINE MICROSCOPIC-ADD ON      Result Value Range   Squamous Epithelial / LPF RARE  RARE   WBC, UA 21-50  <3 WBC/hpf   RBC / HPF 0-2  <3 RBC/hpf   Bacteria, UA MANY (*) RARE   Crystals TRIPLE PHOSPHATE CRYSTALS (*) NEGATIVE  POCT I-STAT, CHEM 8       Result Value Range   Sodium 141  137 - 147 mEq/L   Potassium 3.5 (*) 3.7 - 5.3 mEq/L   Chloride 103  96 - 112 mEq/L   BUN 7  6 - 23 mg/dL   Creatinine, Ser 0.80  0.50 - 1.35 mg/dL   Glucose, Bld 120 (*) 70 - 99 mg/dL   Calcium, Ion 1.15  1.12 - 1.23 mmol/L   TCO2 25  0 - 100 mmol/L   Hemoglobin 14.3  13.0 - 17.0 g/dL   HCT 42.0  39.0 - 52.0 %  CG4 I-STAT (LACTIC ACID)      Result Value Range   Lactic Acid, Venous 3.04 (*) 0.5 - 2.2 mmol/L   Dg Chest 1 View  08/28/2013   CLINICAL DATA:  Fall.  EXAM: CHEST - 1 VIEW  COMPARISON:  Single view of the chest 03/01/2013.  FINDINGS: There is unchanged elevation of the right hemidiaphragm. Lungs appear clear. Heart size is  upper normal. No pneumothorax or pleural effusion. Surgical clips in the left upper quadrant the abdomen are noted.  IMPRESSION: No acute finding.   Electronically Signed   By: Inge Rise M.D.   On: 08/28/2013 23:19   Dg Pelvis 1-2 Views  08/28/2013   CLINICAL DATA:  Fall.  Pain.  EXAM: PELVIS - 1-2 VIEW  COMPARISON:  None.  FINDINGS: There is partial visualization of a right intertrochanteric fracture. No other acute bony or joint abnormality is identified. Osteolysis of the femoral heads is worse on the left. There is destructive change of the left inferior pubic ramus with an appearance most compatible with chronic osteomyelitis.  IMPRESSION: Partial visualization of a right intertrochanteric fracture.  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   Electronically Signed   By: Inge Rise M.D.   On: 08/28/2013 23:18   Dg Femur Right  08/28/2013   CLINICAL DATA:  Right leg pain in a paraplegic patient.  EXAM: RIGHT FEMUR - 2 VIEW  COMPARISON:  None.  FINDINGS: The patient has an intertrochanteric right hip fracture with subtrochanteric extension. There is osteolysis of the right femoral head with remodeling of the right acetabulum  consistent with chronic change. Bones appear osteopenic. There is fixation of a remote healed distal femoral fracture. Advanced for age degenerative disease about the right knee is partially visualized.  IMPRESSION: Right intertrochanteric fracture with subtrochanteric extension.  Chronic appearing osteolysis and remodeling of the right hip and acetabulum.  Remote healed distal right femur fracture with fixation hardware in place.   Electronically Signed   By: Inge Rise M.D.   On: 08/28/2013 23:15   Dg Tibia/fibula Right  08/28/2013   CLINICAL DATA:  Status post fall.  Right lower leg pain.  EXAM: RIGHT TIBIA AND FIBULA - 2 VIEW  COMPARISON:  MRI right knee 12/31/2012.  FINDINGS: The patient has remote healed fractures of the distal right femur, proximal right tibia and fibula and distal right tibia and fibula. No acute bony or joint abnormality is identified. Bones are markedly osteopenic.  IMPRESSION: No acute finding.  Multiple remote fractures as above.   Electronically Signed   By: Inge Rise M.D.   On: 08/28/2013 23:16   Dg Shoulder Left  08/28/2013   CLINICAL DATA:  Fall.  Right shoulder pain.  EXAM: LEFT SHOULDER - 2+ VIEW  COMPARISON:  None.  FINDINGS: No acute bony or joint abnormality is identified. Degenerative disease about the right shoulder is seen. Image right lung and ribs are unremarkable.  IMPRESSION: No acute finding.   Electronically Signed   By: Inge Rise M.D.   On: 08/28/2013 23:17     Orlie Dakin, MD 08/28/13 2355 Medical screening examination/treatment/procedure(s) were conducted as a shared visit with non-physician practitioner(s) and myself.  I personally evaluated the patient during the encounter.  EKG Interpretation   None        Orlie Dakin, MD 08/29/13 4742
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
## 2013-08-29 ENCOUNTER — Encounter (HOSPITAL_COMMUNITY): Payer: Self-pay | Admitting: Nurse Practitioner

## 2013-08-29 ENCOUNTER — Inpatient Hospital Stay (HOSPITAL_COMMUNITY): Payer: PRIVATE HEALTH INSURANCE

## 2013-08-29 DIAGNOSIS — N390 Urinary tract infection, site not specified: Secondary | ICD-10-CM | POA: Diagnosis present

## 2013-08-29 DIAGNOSIS — IMO0002 Reserved for concepts with insufficient information to code with codable children: Secondary | ICD-10-CM | POA: Diagnosis not present

## 2013-08-29 DIAGNOSIS — K219 Gastro-esophageal reflux disease without esophagitis: Secondary | ICD-10-CM | POA: Diagnosis present

## 2013-08-29 DIAGNOSIS — F1010 Alcohol abuse, uncomplicated: Secondary | ICD-10-CM | POA: Diagnosis present

## 2013-08-29 DIAGNOSIS — F319 Bipolar disorder, unspecified: Secondary | ICD-10-CM | POA: Diagnosis present

## 2013-08-29 DIAGNOSIS — L89109 Pressure ulcer of unspecified part of back, unspecified stage: Secondary | ICD-10-CM | POA: Diagnosis present

## 2013-08-29 DIAGNOSIS — Y33XXXS Other specified events, undetermined intent, sequela: Secondary | ICD-10-CM | POA: Diagnosis not present

## 2013-08-29 DIAGNOSIS — S72143A Displaced intertrochanteric fracture of unspecified femur, initial encounter for closed fracture: Secondary | ICD-10-CM | POA: Diagnosis present

## 2013-08-29 DIAGNOSIS — L8994 Pressure ulcer of unspecified site, stage 4: Secondary | ICD-10-CM | POA: Diagnosis present

## 2013-08-29 DIAGNOSIS — Z993 Dependence on wheelchair: Secondary | ICD-10-CM | POA: Diagnosis not present

## 2013-08-29 DIAGNOSIS — M1610 Unilateral primary osteoarthritis, unspecified hip: Secondary | ICD-10-CM | POA: Diagnosis present

## 2013-08-29 DIAGNOSIS — Z6841 Body Mass Index (BMI) 40.0 and over, adult: Secondary | ICD-10-CM | POA: Diagnosis not present

## 2013-08-29 DIAGNOSIS — N319 Neuromuscular dysfunction of bladder, unspecified: Secondary | ICD-10-CM | POA: Diagnosis present

## 2013-08-29 DIAGNOSIS — D72829 Elevated white blood cell count, unspecified: Secondary | ICD-10-CM | POA: Diagnosis present

## 2013-08-29 DIAGNOSIS — Z87891 Personal history of nicotine dependence: Secondary | ICD-10-CM | POA: Diagnosis not present

## 2013-08-29 DIAGNOSIS — E538 Deficiency of other specified B group vitamins: Secondary | ICD-10-CM | POA: Diagnosis present

## 2013-08-29 DIAGNOSIS — G4700 Insomnia, unspecified: Secondary | ICD-10-CM | POA: Diagnosis present

## 2013-08-29 DIAGNOSIS — E876 Hypokalemia: Secondary | ICD-10-CM | POA: Diagnosis present

## 2013-08-29 DIAGNOSIS — G8929 Other chronic pain: Secondary | ICD-10-CM | POA: Diagnosis present

## 2013-08-29 DIAGNOSIS — Z933 Colostomy status: Secondary | ICD-10-CM | POA: Diagnosis not present

## 2013-08-29 DIAGNOSIS — W050XXA Fall from non-moving wheelchair, initial encounter: Secondary | ICD-10-CM | POA: Diagnosis present

## 2013-08-29 DIAGNOSIS — D62 Acute posthemorrhagic anemia: Secondary | ICD-10-CM | POA: Diagnosis not present

## 2013-08-29 DIAGNOSIS — B1920 Unspecified viral hepatitis C without hepatic coma: Secondary | ICD-10-CM | POA: Diagnosis present

## 2013-08-29 DIAGNOSIS — E43 Unspecified severe protein-calorie malnutrition: Secondary | ICD-10-CM | POA: Diagnosis present

## 2013-08-29 DIAGNOSIS — E785 Hyperlipidemia, unspecified: Secondary | ICD-10-CM | POA: Diagnosis present

## 2013-08-29 DIAGNOSIS — D638 Anemia in other chronic diseases classified elsewhere: Secondary | ICD-10-CM | POA: Diagnosis present

## 2013-08-29 DIAGNOSIS — A4902 Methicillin resistant Staphylococcus aureus infection, unspecified site: Secondary | ICD-10-CM | POA: Diagnosis present

## 2013-08-29 DIAGNOSIS — E669 Obesity, unspecified: Secondary | ICD-10-CM | POA: Diagnosis present

## 2013-08-29 DIAGNOSIS — I10 Essential (primary) hypertension: Secondary | ICD-10-CM | POA: Diagnosis present

## 2013-08-29 DIAGNOSIS — S78119A Complete traumatic amputation at level between unspecified hip and knee, initial encounter: Secondary | ICD-10-CM | POA: Diagnosis not present

## 2013-08-29 DIAGNOSIS — E8809 Other disorders of plasma-protein metabolism, not elsewhere classified: Secondary | ICD-10-CM | POA: Diagnosis present

## 2013-08-29 DIAGNOSIS — S72009A Fracture of unspecified part of neck of unspecified femur, initial encounter for closed fracture: Secondary | ICD-10-CM

## 2013-08-29 DIAGNOSIS — G547 Phantom limb syndrome without pain: Secondary | ICD-10-CM | POA: Diagnosis present

## 2013-08-29 DIAGNOSIS — M25559 Pain in unspecified hip: Secondary | ICD-10-CM | POA: Diagnosis present

## 2013-08-29 DIAGNOSIS — F1910 Other psychoactive substance abuse, uncomplicated: Secondary | ICD-10-CM | POA: Diagnosis present

## 2013-08-29 DIAGNOSIS — G8220 Paraplegia, unspecified: Secondary | ICD-10-CM | POA: Diagnosis present

## 2013-08-29 LAB — RAPID URINE DRUG SCREEN, HOSP PERFORMED
Amphetamines: NOT DETECTED
BENZODIAZEPINES: NOT DETECTED
Barbiturates: NOT DETECTED
Cocaine: NOT DETECTED
Opiates: NOT DETECTED
Tetrahydrocannabinol: NOT DETECTED

## 2013-08-29 LAB — SURGICAL PCR SCREEN
MRSA, PCR: POSITIVE — AB
Staphylococcus aureus: POSITIVE — AB

## 2013-08-29 MED ORDER — SENNOSIDES-DOCUSATE SODIUM 8.6-50 MG PO TABS
1.0000 | ORAL_TABLET | Freq: Every day | ORAL | Status: DC
  Administered 2013-08-29 – 2013-09-03 (×6): 1 via ORAL
  Filled 2013-08-29 (×7): qty 1

## 2013-08-29 MED ORDER — PREGABALIN 50 MG PO CAPS
75.0000 mg | ORAL_CAPSULE | Freq: Two times a day (BID) | ORAL | Status: DC
  Administered 2013-08-29 – 2013-09-05 (×15): 75 mg via ORAL
  Filled 2013-08-29: qty 1
  Filled 2013-08-29 (×2): qty 3
  Filled 2013-08-29 (×5): qty 1
  Filled 2013-08-29 (×2): qty 3
  Filled 2013-08-29: qty 1
  Filled 2013-08-29: qty 3
  Filled 2013-08-29: qty 1
  Filled 2013-08-29: qty 3
  Filled 2013-08-29 (×2): qty 1
  Filled 2013-08-29: qty 3
  Filled 2013-08-29 (×2): qty 1
  Filled 2013-08-29: qty 3

## 2013-08-29 MED ORDER — NITROGLYCERIN 0.4 MG SL SUBL: 0.4000 mg | SUBLINGUAL_TABLET | SUBLINGUAL | Status: DC | PRN

## 2013-08-29 MED ORDER — DEXTROSE 5 % IV SOLN: 1.0000 g | Freq: Once | INTRAVENOUS | Status: DC

## 2013-08-29 MED ORDER — AVANAFIL 100 MG PO TABS: 100.0000 mg | ORAL_TABLET | Freq: Every day | ORAL | Status: DC | PRN

## 2013-08-29 MED ORDER — PANTOPRAZOLE SODIUM 40 MG PO TBEC
40.0000 mg | DELAYED_RELEASE_TABLET | ORAL | Status: DC
  Administered 2013-08-29 – 2013-09-05 (×14): 40 mg via ORAL
  Filled 2013-08-29 (×15): qty 1

## 2013-08-29 MED ORDER — OXYBUTYNIN CHLORIDE 5 MG PO TABS
5.0000 mg | ORAL_TABLET | Freq: Every day | ORAL | Status: DC
  Administered 2013-08-29 – 2013-09-05 (×6): 5 mg via ORAL
  Filled 2013-08-29 (×9): qty 1

## 2013-08-29 MED ORDER — QUETIAPINE FUMARATE 50 MG PO TABS
50.0000 mg | ORAL_TABLET | Freq: Every day | ORAL | Status: DC
  Administered 2013-08-29 – 2013-09-04 (×6): 50 mg via ORAL
  Filled 2013-08-29 (×9): qty 1

## 2013-08-29 MED ORDER — OXYCODONE HCL 5 MG PO TABS
15.0000 mg | ORAL_TABLET | Freq: Three times a day (TID) | ORAL | Status: DC | PRN
  Administered 2013-08-29 – 2013-09-05 (×13): 15 mg via ORAL
  Filled 2013-08-29 (×14): qty 3

## 2013-08-29 MED ORDER — BACLOFEN 20 MG PO TABS
20.0000 mg | ORAL_TABLET | Freq: Three times a day (TID) | ORAL | Status: DC
  Administered 2013-08-29 – 2013-09-05 (×19): 20 mg via ORAL
  Filled 2013-08-29 (×27): qty 1

## 2013-08-29 MED ORDER — CHLORHEXIDINE GLUCONATE CLOTH 2 % EX PADS
6.0000 | MEDICATED_PAD | Freq: Every day | CUTANEOUS | Status: AC
  Administered 2013-08-29 – 2013-08-30 (×2): 6 via TOPICAL

## 2013-08-29 MED ORDER — IBUPROFEN 800 MG PO TABS
800.0000 mg | ORAL_TABLET | Freq: Once | ORAL | Status: AC
  Administered 2013-08-29: 800 mg via ORAL
  Filled 2013-08-29: qty 1

## 2013-08-29 MED ORDER — ALUM & MAG HYDROXIDE-SIMETH 200-200-20 MG/5ML PO SUSP
20.0000 mL | Freq: Two times a day (BID) | ORAL | Status: DC | PRN
  Administered 2013-08-29 – 2013-09-04 (×5): 20 mL via ORAL
  Filled 2013-08-29 (×6): qty 30

## 2013-08-29 MED ORDER — FOLIC ACID 1 MG PO TABS
1.0000 mg | ORAL_TABLET | Freq: Every day | ORAL | Status: DC
  Administered 2013-08-29 – 2013-09-05 (×8): 1 mg via ORAL
  Filled 2013-08-29 (×9): qty 1

## 2013-08-29 MED ORDER — POTASSIUM CHLORIDE CRYS ER 20 MEQ PO TBCR
40.0000 meq | EXTENDED_RELEASE_TABLET | Freq: Once | ORAL | Status: AC
  Administered 2013-08-29: 40 meq via ORAL
  Filled 2013-08-29: qty 2

## 2013-08-29 MED ORDER — SODIUM CHLORIDE 0.9 % IV SOLN
INTRAVENOUS | Status: DC
  Administered 2013-08-29 – 2013-08-30 (×2): via INTRAVENOUS
  Administered 2013-08-31: 1000 mL via INTRAVENOUS
  Administered 2013-09-04: 20:00:00 via INTRAVENOUS

## 2013-08-29 MED ORDER — ATORVASTATIN CALCIUM 10 MG PO TABS
10.0000 mg | ORAL_TABLET | Freq: Every day | ORAL | Status: DC
  Administered 2013-08-29 – 2013-09-04 (×6): 10 mg via ORAL
  Filled 2013-08-29 (×9): qty 1

## 2013-08-29 MED ORDER — CLONAZEPAM 0.5 MG PO TABS
0.5000 mg | ORAL_TABLET | Freq: Two times a day (BID) | ORAL | Status: DC | PRN
  Administered 2013-08-29 – 2013-09-05 (×9): 0.5 mg via ORAL
  Filled 2013-08-29 (×10): qty 1

## 2013-08-29 MED ORDER — HYDROMORPHONE HCL PF 1 MG/ML IJ SOLN
0.5000 mg | Freq: Once | INTRAMUSCULAR | Status: AC
  Administered 2013-08-29: 0.5 mg via INTRAVENOUS
  Filled 2013-08-29: qty 1

## 2013-08-29 MED ORDER — KETOROLAC TROMETHAMINE 30 MG/ML IJ SOLN
30.0000 mg | Freq: Four times a day (QID) | INTRAMUSCULAR | Status: DC | PRN
  Administered 2013-08-30 – 2013-09-01 (×4): 30 mg via INTRAVENOUS
  Filled 2013-08-29 (×5): qty 1

## 2013-08-29 MED ORDER — DULOXETINE HCL 30 MG PO CPEP
30.0000 mg | ORAL_CAPSULE | Freq: Every day | ORAL | Status: DC
  Administered 2013-08-29 – 2013-09-04 (×6): 30 mg via ORAL
  Filled 2013-08-29 (×9): qty 1

## 2013-08-29 MED ORDER — MUPIROCIN 2 % EX OINT
1.0000 | TOPICAL_OINTMENT | Freq: Two times a day (BID) | CUTANEOUS | Status: AC
Start: 2013-08-29 — End: 2013-09-03
  Administered 2013-08-29 – 2013-09-02 (×8): 1 via NASAL
  Filled 2013-08-29 (×2): qty 22

## 2013-08-29 MED ORDER — LAMOTRIGINE 200 MG PO TABS
200.0000 mg | ORAL_TABLET | Freq: Two times a day (BID) | ORAL | Status: DC
  Administered 2013-08-29 – 2013-09-04 (×14): 200 mg via ORAL
  Filled 2013-08-29 (×21): qty 1

## 2013-08-29 MED ORDER — AMLODIPINE BESYLATE 5 MG PO TABS
5.0000 mg | ORAL_TABLET | Freq: Every day | ORAL | Status: DC
  Administered 2013-08-29 – 2013-09-03 (×5): 5 mg via ORAL
  Filled 2013-08-29 (×8): qty 1

## 2013-08-29 MED ORDER — POTASSIUM CHLORIDE CRYS ER 10 MEQ PO TBCR
10.0000 meq | EXTENDED_RELEASE_TABLET | Freq: Every day | ORAL | Status: DC
  Administered 2013-08-29 – 2013-09-05 (×7): 10 meq via ORAL
  Filled 2013-08-29 (×8): qty 1

## 2013-08-29 MED ORDER — URELLE 81 MG PO TABS
1.0000 | ORAL_TABLET | Freq: Four times a day (QID) | ORAL | Status: DC | PRN
  Administered 2013-08-29: 81 mg via ORAL
  Filled 2013-08-29 (×3): qty 1

## 2013-08-29 MED ORDER — PRO-STAT SUGAR FREE PO LIQD
30.0000 mL | Freq: Three times a day (TID) | ORAL | Status: DC
  Administered 2013-08-29 – 2013-09-03 (×6): 30 mL via ORAL
  Filled 2013-08-29 (×23): qty 30

## 2013-08-29 MED ORDER — SODIUM CHLORIDE 0.9 % IJ SOLN
10.0000 mL | INTRAMUSCULAR | Status: DC | PRN
  Administered 2013-08-31: 20 mL
  Administered 2013-09-04 – 2013-09-05 (×2): 10 mL

## 2013-08-29 MED ORDER — POLYVINYL ALCOHOL 1.4 % OP SOLN
2.0000 [drp] | OPHTHALMIC | Status: DC | PRN
  Administered 2013-08-29 – 2013-09-04 (×12): 2 [drp] via OPHTHALMIC
  Filled 2013-08-29 (×3): qty 15

## 2013-08-29 MED ORDER — ACETAMINOPHEN 325 MG PO TABS
650.0000 mg | ORAL_TABLET | Freq: Four times a day (QID) | ORAL | Status: DC | PRN
  Administered 2013-08-29 – 2013-09-01 (×6): 650 mg via ORAL
  Filled 2013-08-29 (×6): qty 2

## 2013-08-29 MED ORDER — ENOXAPARIN SODIUM 40 MG/0.4ML ~~LOC~~ SOLN
40.0000 mg | SUBCUTANEOUS | Status: DC
  Administered 2013-08-30 – 2013-08-31 (×2): 40 mg via SUBCUTANEOUS
  Filled 2013-08-29 (×4): qty 0.4

## 2013-08-29 MED ORDER — CEFTRIAXONE SODIUM 1 G IJ SOLR
1.0000 g | INTRAMUSCULAR | Status: DC
  Administered 2013-08-29 – 2013-09-03 (×6): 1 g via INTRAVENOUS
  Filled 2013-08-29 (×7): qty 10

## 2013-08-29 MED ORDER — URELLE 81 MG PO TABS: 1.0000 | ORAL_TABLET | Freq: Four times a day (QID) | ORAL | Status: DC | PRN

## 2013-08-29 NOTE — ED Provider Notes (Signed)
I have personally seen and examined the patient.  I have discussed the plan of care with the resident.  I have reviewed the documentation on PMH/FH/Soc. History.  I have reviewed the documentation of the resident and agree.  Orlie Dakin, MD 08/29/13 [phone]

## 2013-08-29 NOTE — ED Provider Notes (Signed)
Medical screening examination/treatment/procedure(s) were performed by non-physician practitioner and as supervising physician I was immediately available for consultation/collaboration.  EKG Interpretation   None        Orlie Dakin, MD 08/29/13 [phone]

## 2013-08-29 NOTE — Progress Notes (Signed)
Dr. Dyann Kief on unit and made aware of patients most recent BP. No new orders. Will continue to monitor.

## 2013-08-29 NOTE — Progress Notes (Signed)
The patient is refusing all of his labs and wanted a PICC line placed due to poor venous access.  Ultrasound was used to find his current IVs.  The patient refused his second blood culture draw as well.  Fredirick Maudlin was notified at 0330.  New orders were placed for a PICC line and the RN was instructed to go ahead and give the scheduled IV antibiotic.  The RN carried out the orders.
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
## 2013-08-29 NOTE — ED Notes (Signed)
Patient Requested and received a Ginger.

## 2013-08-29 NOTE — ED Provider Notes (Signed)
Medical screening examination/treatment/procedure(s) were performed by non-physician practitioner and as supervising physician I was immediately available for consultation/collaboration.      Merryl Hacker, MD 08/29/13 [phone]
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
## 2013-08-29 NOTE — Progress Notes (Signed)
Patient's BP 89/49, HR 66, O2 sats 96 on 2L [place], RR 18 . Patient asymptomatic with low BP and has no complaints at this time. Patient denies fatigue, dizziness, diaphoresis, or flushing feeling. Will let Dr. Dyann Kief know of patients vitals.

## 2013-08-29 NOTE — Progress Notes (Signed)
Peripherally Inserted Central Catheter/Midline Placement  The IV Nurse has discussed with the patient and/or persons authorized to consent for the patient, the purpose of this procedure and the potential benefits and risks involved with this procedure.  The benefits include less needle sticks, lab draws from the catheter and patient may be discharged home with the catheter.  Risks include, but not limited to, infection, bleeding, blood clot (thrombus formation), and puncture of an artery; nerve damage and irregular heat beat.  Alternatives to this procedure were also discussed.  PICC/Midline Placement Documentation        Samuel [name] 08/29/2013, 6:22 PM

## 2013-08-29 NOTE — Consult Note (Signed)
WOC wound consult note Reason for Consult: wound evaluation.  Pt reports that he has open wound on his scrotum, after we turned pt to both the left and right it was finally discovered that he has a open wound that is distal to the scrotum and is posterior left AKA. In somewhat of a skin fold.  Wound type: history of Stage IV Pressure Ulcer Pressure Ulcer POA: Yes Measurement: 3.0cm x 1.0cm x 1.0cm  Wound RCV:ELFY, pink, non granular wound, appears chronic in nature.   Drainage (amount, consistency, odor) very moist, non infected Periwound:intact Dressing procedure/placement/frequency:dry gauze packed into the wound, will switch to silver hydrofiber for recalcitrant wound.  Change every other day starting tom.     Discussed POC with patient and bedside nurse.  Re consult if needed, will not follow at this time. Thanks  Samuel [name] [place], [place] [phone])

## 2013-08-29 NOTE — Progress Notes (Signed)
Continuous pulse oximetry set up for patient. Patient sating 97% on 2L via nasal cannula.

## 2013-08-29 NOTE — Progress Notes (Signed)
UR completed Jebadiah Imperato K. Ted Goodner, RN, BSN, [place], CCM  08/29/2013 3:07 PM

## 2013-08-29 NOTE — Progress Notes (Signed)
[facility] PROGRESS NOTE  Samuel [name] [id number] DOB: 05-Sep-1955 DOA: 08/28/2013 PCP: [name], MD  Assessment/Plan: Right hip fracture  - after the fall from wheelchair  - ortho team consulted, plan is to observe conservatively and ask PT to evaluate capacity to tolerate transfers with ongoing pain. If able to do it comfortable, will allow to heal by second intention. Otherwise will need surgical fixation. Will follow Dr. Marlou Sa rec's  ? UTI - will continue empiric Rocephin and will follow up on urine culture   Anemia of chronic disease  - Hg and Hct stable and at baseline  - no signs of active bleed  - CBC in AM to follow Hgb trend  History of PSA  - alcohol, tobacco, narcotics  - pt denies any illicit substance abuse and reports he quit smoking and alcohol use 5 years ago  -will be cautious with pain meds  Hypokalemia  - mild, will supplement and repeat BMP in AM   Leukocytosis  - secondary to demargination from hip fracture and UTI  - ABX Rocephin,  - follow up on urine culture, CBC in AM  Decubitus ulcer -will ask assistance from wound care service.  GERD continue PPI  Positive MRSA Will use chlorhexidine and mupirocin Patient on contact  Code Status: Full Family Communication: no family at bedside Disposition Plan: back to SNF when medically stable.   Consultants:  Dr. Marlou Sa (orthopedic service)  Procedures:  See below for x-ray reports   Antibiotics:  Rocephin   HPI/Subjective: Afebrile, complaining of pain on his right knee and right hip  Objective: Filed Vitals:   08/29/13 1004  BP: 89/49  Pulse: 66  Temp:   Resp:     Intake/Output Summary (Last 24 hours) at 08/29/13 1126 Last data filed at 08/29/13 0900  Gross per 24 hour  Intake      0 ml  Output    800 ml  Net   -800 ml   Filed Weights   08/28/13 2006 08/29/13 0301 08/29/13 0526  Weight: 84.823 kg (187 lb) 110.3 kg (243 lb 2.7 oz) 109.4 kg (241 lb 2.9  oz)    Exam:   General:  Afebrile, complaining of mild to moderate pain on his right knee and hip  Cardiovascular: regular rate, no rubs or gallops  Respiratory: CTA bilaterally  Abdomen: soft, NT, ND, positive BS; patient with colostomy in place (no drainage and with stools inside the bag)  Musculoskeletal: Left AKA, RLE with tenderness to palpation, no cyanosis  Skin: stage 4 sacrum ulcer, no malodorous drainage  Data Reviewed: Basic Metabolic Panel:  Recent Labs Lab 08/28/13 2220  NA 141  K 3.5*  CL 103  GLUCOSE 120*  BUN 7  CREATININE 0.80   CBC:  Recent Labs Lab 08/28/13 2211 08/28/13 2220  WBC 16.2*  --   NEUTROABS 12.9*  --   HGB 12.7* 14.3  HCT 37.9* 42.0  MCV 79.1  --   PLT 267  --    CBG: No results found for this basename: GLUCAP,  in the last 168 hours  Recent Results (from the past 240 hour(s))  SURGICAL PCR SCREEN     Status: Abnormal   Collection Time    08/29/13  4:35 AM      Result Value Range Status   MRSA, PCR POSITIVE (*) NEGATIVE Final   Comment: RESULT CALLED TO, READ BACK BY AND VERIFIED WITH:     Marcy Siren (RN) [phone] 08/29/2013 L. LOMAX   Staphylococcus  aureus POSITIVE (*) NEGATIVE Final   Comment:            The Xpert SA Assay (FDA     approved for NASAL specimens     in patients over 18 years of age),     is one component of     a comprehensive surveillance     program.  Test performance has     been validated by [employer] for patients greater     than or equal to 31 year old.     It is not intended     to diagnose infection nor to     guide or monitor treatment.     RESULT CALLED TO, READ BACK BY AND VERIFIED WITH:     Marcy Siren (RN) [phone] 08/29/2013 L. LOMAX     Studies: Dg Chest 1 View  08/28/2013   CLINICAL DATA:  Fall.  EXAM: CHEST - 1 VIEW  COMPARISON:  Single view of the chest 03/01/2013.  FINDINGS: There is unchanged elevation of the right hemidiaphragm. Lungs appear clear. Heart size is upper normal. No  pneumothorax or pleural effusion. Surgical clips in the left upper quadrant the abdomen are noted.  IMPRESSION: No acute finding.   Electronically Signed   By: Inge Rise M.D.   On: 08/28/2013 23:19   Dg Pelvis 1-2 Views  08/28/2013   CLINICAL DATA:  Fall.  Pain.  EXAM: PELVIS - 1-2 VIEW  COMPARISON:  None.  FINDINGS: There is partial visualization of a right intertrochanteric fracture. No other acute bony or joint abnormality is identified. Osteolysis of the femoral heads is worse on the left. There is destructive change of the left inferior pubic ramus with an appearance most compatible with chronic osteomyelitis.  IMPRESSION: Partial visualization of a right intertrochanteric fracture.  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   Electronically Signed   By: Inge Rise M.D.   On: 08/28/2013 23:18   Dg Femur Right  08/28/2013   CLINICAL DATA:  Right leg pain in a paraplegic patient.  EXAM: RIGHT FEMUR - 2 VIEW  COMPARISON:  None.  FINDINGS: The patient has an intertrochanteric right hip fracture with subtrochanteric extension. There is osteolysis of the right femoral head with remodeling of the right acetabulum consistent with chronic change. Bones appear osteopenic. There is fixation of a remote healed distal femoral fracture. Advanced for age degenerative disease about the right knee is partially visualized.  IMPRESSION: Right intertrochanteric fracture with subtrochanteric extension.  Chronic appearing osteolysis and remodeling of the right hip and acetabulum.  Remote healed distal right femur fracture with fixation hardware in place.   Electronically Signed   By: Inge Rise M.D.   On: 08/28/2013 23:15   Dg Tibia/fibula Right  08/28/2013   CLINICAL DATA:  Status post fall.  Right lower leg pain.  EXAM: RIGHT TIBIA AND FIBULA - 2 VIEW  COMPARISON:  MRI right knee 12/31/2012.  FINDINGS: The patient has  remote healed fractures of the distal right femur, proximal right tibia and fibula and distal right tibia and fibula. No acute bony or joint abnormality is identified. Bones are markedly osteopenic.  IMPRESSION: No acute finding.  Multiple remote fractures as above.   Electronically Signed   By: Inge Rise M.D.   On: 08/28/2013 23:16   Dg Chest Portable 1 View  08/29/2013   CLINICAL DATA:  Fall with hip  and knee pain.  EXAM: PORTABLE CHEST - 1 VIEW  COMPARISON:  Chest x-ray from yesterday.  FINDINGS: Chronic elevation of the right diaphragm. Widening of the lower mediastinum which may represent hiatal hernia given surgical changes in the region. Chronic cardiomegaly. No edema, effusion, or pneumothorax.  IMPRESSION: Stable exam.  No edema or definite pneumonia.   Electronically Signed   By: Jorje Guild M.D.   On: 08/29/2013 06:30   Dg Shoulder Left  08/28/2013   CLINICAL DATA:  Fall.  Right shoulder pain.  EXAM: LEFT SHOULDER - 2+ VIEW  COMPARISON:  None.  FINDINGS: No acute bony or joint abnormality is identified. Degenerative disease about the right shoulder is seen. Image right lung and ribs are unremarkable.  IMPRESSION: No acute finding.   Electronically Signed   By: Inge Rise M.D.   On: 08/28/2013 23:17    Scheduled Meds: . amLODipine  5 mg Oral QHS  . atorvastatin  10 mg Oral QHS  . baclofen  20 mg Oral TID  . cefTRIAXone (ROCEPHIN)  IV  1 g Intravenous Once  . cefTRIAXone (ROCEPHIN)  IV  1 g Intravenous Q24H  . Chlorhexidine Gluconate Cloth  6 each Topical Q0600  . DULoxetine  30 mg Oral QHS  . enoxaparin (LOVENOX) injection  40 mg Subcutaneous Q24H  . feeding supplement (PRO-STAT SUGAR FREE 64)  30 mL Oral TID WC  . folic acid  1 mg Oral Daily  . lamoTRIgine  200 mg Oral BID  . mupirocin ointment  1 application Nasal BID  . oxybutynin  5 mg Oral Daily  . pantoprazole  40 mg Oral Custom  . potassium chloride  10 mEq Oral Daily  . pregabalin  75 mg Oral BID  .  QUEtiapine  50 mg Oral QHS  . senna-docusate  1 tablet Oral Daily   Continuous Infusions: . sodium chloride 50 mL/hr at 08/29/13 0404    Active Problems:   Hip fracture    Time spent: >30 minutes    Falicity Sheets  [facility] Pager [phone]. If 7PM-7AM, please contact night-coverage at [email or web address], password [facility] 08/29/2013, 11:26 AM  LOS: 1 day

## 2013-08-29 NOTE — ED Notes (Signed)
Pt refusing to be stuck for 2nd set of blood cultures, pt states " I need to have a PICC line placed I'm not being stuck over and over".  Fredderick Phenix, PA aware- antibiotics still held.

## 2013-08-29 NOTE — Progress Notes (Signed)
The patient arrived to 3E05 from the ED at 0300.  He was oriented to the unit and placed on telemetry.  VS were taken and the patient was assessed.  His admission history was completed.  He is A&Ox4, anxious, and in pain from his right fractured hip.  He was given OxyIR with relief and Klonopin.  Ice was placed on his right hip.  His neuro checks were WNL and charted.  He is on 2L N/C.  He has a healing Stage 4 wound on his sacrum and will need a wound/ostomy consult.  The call bell was placed within reach and the bed alarm was turned on.

## 2013-08-29 NOTE — Progress Notes (Signed)
CRITICAL VALUE ALERT  Critical value received:  Positive MRSA and staph aureus PCR  Date of notification:  08/29/13  Time of notification:  0630  Critical value read back:yes  Nurse who received alert:  Waynetta Sandy, RN  MD notified (1st page):    Time of first page:    MD notified (2nd page):  Time of second page:  Responding MD:    Time MD responded:

## 2013-08-29 NOTE — Progress Notes (Signed)
The patient's EKG was completed and placed in the patient's chart. 

## 2013-08-29 NOTE — Care Management Note (Addendum)
  Page 1 of 1   09/01/2013     10:39:19 AM   CARE MANAGEMENT NOTE 09/01/2013  Patient:  RITVIK, MCZEAL   Account Number:  [id number]  Date Initiated:  08/29/2013  Documentation initiated by:  Mariann Laster  Subjective/Objective Assessment:   paraplegic with incomplete spinal cord injury who slid out of his wheelchair yesterday and sustained a right hip fracture     Action/Plan:   Anticipated DC Date:  09/04/2013   Anticipated DC Plan:           Choice offered to / List presented to:             Status of service:  In process, will continue to follow Medicare Important Message given?   (If response is "NO", the following Medicare IM given date fields will be blank) Date Medicare IM given:   Date Additional Medicare IM given:    Discharge Disposition:    Per UR Regulation:  Reviewed for med. necessity/level of care/duration of stay  If discussed at [place] of Stay Meetings, dates discussed:    Comments:  09/01/2013 Hematoma hip complication of 08/02/1094 hip surgery Blood Transfusions  HBG 7.8 Back to surgery today 09/01/2013 and NPO [address] RN, BSN, [place], CCM [phone] 09/01/2013  Social:  From Home Hip Surgery 08/29/2013 [place], BSN, [place], [place] ([place] [phone] 08/29/2013

## 2013-08-30 LAB — COMPREHENSIVE METABOLIC PANEL
ALK PHOS: 78 U/L (ref 39–117)
ALT: 15 U/L (ref 0–53)
AST: 19 U/L (ref 0–37)
Albumin: 2.9 g/dL — ABNORMAL LOW (ref 3.5–5.2)
BUN: 8 mg/dL (ref 6–23)
CO2: 23 mEq/L (ref 19–32)
Calcium: 8.2 mg/dL — ABNORMAL LOW (ref 8.4–10.5)
Chloride: 105 mEq/L (ref 96–112)
Creatinine, Ser: 0.7 mg/dL (ref 0.50–1.35)
GFR calc Af Amer: >90 mL/min (ref >90)
GLUCOSE: 135 mg/dL — AB (ref 70–99)
POTASSIUM: 4.2 meq/L (ref 3.7–5.3)
SODIUM: 140 meq/L (ref 137–147)
Total Bilirubin: 0.5 mg/dL (ref 0.3–1.2)
Total Protein: 6.3 g/dL (ref 6.0–8.3)

## 2013-08-30 MED ORDER — ADULT MULTIVITAMIN LIQUID CH
5.0000 mL | Freq: Every day | ORAL | Status: DC
  Administered 2013-08-31 – 2013-09-03 (×3): 5 mL via ORAL
  Filled 2013-08-30 (×9): qty 5

## 2013-08-30 MED ORDER — JUVEN PO PACK
1.0000 | PACK | Freq: Two times a day (BID) | ORAL | Status: DC
  Administered 2013-08-30 – 2013-09-02 (×3): 1 via ORAL
  Filled 2013-08-30 (×15): qty 1

## 2013-08-30 NOTE — Progress Notes (Signed)
The patient had a mild fever (100.3 and 99.9, respectively) overnight.  His neurovascular checks remained unchanged, but he did complain of a lot of pain in his right hip.  He received a one-time dose of Dilaudid for breakthrough pain, Toradol, Oxy IR, and Tylenol.

## 2013-08-30 NOTE — Progress Notes (Signed)
[facility] PROGRESS NOTE  Samuel [name] [id number] DOB: 09/01/1955 DOA: 08/28/2013 PCP: [name], MD  Assessment/Plan: Right hip fracture  - after falling from wheelchair  - ortho team consulted, plan is to observe conservatively and ask PT to evaluate capacity to tolerate transfers with ongoing pain. If able to do it comfortable, will allow to heal by second intention. Otherwise will need surgical fixation. Will follow Dr. Marlou Sa rec's  ? UTI - will continue empiric Rocephin and will follow up on urine culture  -exchange foley catheter   Anemia of chronic disease  - Hg and Hct stable and at baseline  - no signs of active bleed  - CBC in AM to follow Hgb trend   History of PSA  - alcohol, tobacco, narcotics  - pt denies any illicit substance abuse and reports he quit smoking and alcohol use 5 years ago  -will be cautious with pain meds  Hypokalemia  - repleted   Leukocytosis  - secondary to demargination from hip fracture and UTI  - ABX Rocephin,  - follow up on urine culture, CBC in AM  Decubitus ulcer -will ask assistance from wound care service.  GERD continue PPI  Positive MRSA Will use chlorhexidine and mupirocin Patient on contact  Protein calorie malnutrition (severe) and hypoalbuminemia: Continue prostat  Code Status: Full Family Communication: no family at bedside Disposition Plan: back to SNF when medically stable.   Consultants:  Dr. Marlou Sa (orthopedic service)  Procedures:  See below for x-ray reports   Antibiotics:  Rocephin   HPI/Subjective: Spike fever last night; complaining of pain on his right hip and also suprapubic area.  Objective: Filed Vitals:   08/30/13 0557  BP: 119/55  Pulse: 118  Temp: 99.9 F (37.7 C)  Resp: 18    Intake/Output Summary (Last 24 hours) at 08/30/13 1124 Last data filed at 08/30/13 1108  Gross per 24 hour  Intake    940 ml  Output   1850 ml  Net   -910 ml   Filed Weights    08/29/13 0301 08/29/13 0526 08/30/13 0430  Weight: 110.3 kg (243 lb 2.7 oz) 109.4 kg (241 lb 2.9 oz) 110.315 kg (243 lb 3.2 oz)    Exam:   General:  Afebrile, complaining of mild to moderate pain on his right knee and hip  Cardiovascular: regular rate, no rubs or gallops  Respiratory: CTA bilaterally  Abdomen: soft, NT, ND, positive BS; patient with colostomy in place (no drainage and with stools inside the bag)  Musculoskeletal: Left AKA, RLE with tenderness to palpation, no cyanosis  Skin: stage 4 sacrum ulcer, no malodorous drainage  Data Reviewed: Basic Metabolic Panel:  Recent Labs Lab 08/28/13 2220 08/30/13 0440  NA 141 140  K 3.5* 4.2  CL 103 105  CO2  --  23  GLUCOSE 120* 135*  BUN 7 8  CREATININE 0.80 0.70  CALCIUM  --  8.2*   CBC:  Recent Labs Lab 08/28/13 2211 08/28/13 2220  WBC 16.2*  --   NEUTROABS 12.9*  --   HGB 12.7* 14.3  HCT 37.9* 42.0  MCV 79.1  --   PLT 267  --    CBG: No results found for this basename: GLUCAP,  in the last 168 hours  Recent Results (from the past 240 hour(s))  URINE CULTURE     Status: None   Collection Time    08/28/13  9:56 PM      Result Value Range Status   Specimen  Description URINE, CATHETERIZED   Final   Special Requests CX ADDED AT 2227 ON [id number]   Final   Culture  Setup Time     Final   Value: 08/28/2013 23:08     Performed at [place] PENDING   Incomplete   Culture     Final   Value: Culture reincubated for better growth     Performed at [facility]   Report Status PENDING   Incomplete  CULTURE, BLOOD (ROUTINE X 2)     Status: None   Collection Time    08/29/13  1:13 AM      Result Value Range Status   Specimen Description BLOOD RIGHT ANTECUBITAL   Final   Special Requests BOTTLES DRAWN AEROBIC AND ANAEROBIC 5CC EA   Final   Culture  Setup Time     Final   Value: 08/29/2013 08:47     Performed at [facility]   Culture     Final   Value:        BLOOD  CULTURE RECEIVED NO GROWTH TO DATE CULTURE WILL BE HELD FOR 5 DAYS BEFORE ISSUING A FINAL NEGATIVE REPORT     Performed at [facility]   Report Status PENDING   Incomplete  SURGICAL PCR SCREEN     Status: Abnormal   Collection Time    08/29/13  4:35 AM      Result Value Range Status   MRSA, PCR POSITIVE (*) NEGATIVE Final   Comment: RESULT CALLED TO, READ BACK BY AND VERIFIED WITH:     Marcy Siren (RN) [phone] 08/29/2013 L. LOMAX   Staphylococcus aureus POSITIVE (*) NEGATIVE Final   Comment:            The Xpert SA Assay (FDA     approved for NASAL specimens     in patients over 68 years of age),     is one component of     a comprehensive surveillance     program.  Test performance has     been validated by [employer] for patients greater     than or equal to 62 year old.     It is not intended     to diagnose infection nor to     guide or monitor treatment.     RESULT CALLED TO, READ BACK BY AND VERIFIED WITH:     Marcy Siren (RN) [phone] 08/29/2013 L. LOMAX     Studies: Dg Chest 1 View  08/28/2013   CLINICAL DATA:  Fall.  EXAM: CHEST - 1 VIEW  COMPARISON:  Single view of the chest 03/01/2013.  FINDINGS: There is unchanged elevation of the right hemidiaphragm. Lungs appear clear. Heart size is upper normal. No pneumothorax or pleural effusion. Surgical clips in the left upper quadrant the abdomen are noted.  IMPRESSION: No acute finding.   Electronically Signed   By: Inge Rise M.D.   On: 08/28/2013 23:19   Dg Pelvis 1-2 Views  08/28/2013   CLINICAL DATA:  Fall.  Pain.  EXAM: PELVIS - 1-2 VIEW  COMPARISON:  None.  FINDINGS: There is partial visualization of a right intertrochanteric fracture. No other acute bony or joint abnormality is identified. Osteolysis of the femoral heads is worse on the left. There is destructive change of the left inferior pubic ramus with an appearance most compatible with chronic osteomyelitis.  IMPRESSION: Partial visualization of a right  intertrochanteric fracture.  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   Electronically Signed   By: Inge Rise M.D.   On: 08/28/2013 23:18   Dg Femur Right  08/28/2013   CLINICAL DATA:  Right leg pain in a paraplegic patient.  EXAM: RIGHT FEMUR - 2 VIEW  COMPARISON:  None.  FINDINGS: The patient has an intertrochanteric right hip fracture with subtrochanteric extension. There is osteolysis of the right femoral head with remodeling of the right acetabulum consistent with chronic change. Bones appear osteopenic. There is fixation of a remote healed distal femoral fracture. Advanced for age degenerative disease about the right knee is partially visualized.  IMPRESSION: Right intertrochanteric fracture with subtrochanteric extension.  Chronic appearing osteolysis and remodeling of the right hip and acetabulum.  Remote healed distal right femur fracture with fixation hardware in place.   Electronically Signed   By: Inge Rise M.D.   On: 08/28/2013 23:15   Dg Tibia/fibula Right  08/28/2013   CLINICAL DATA:  Status post fall.  Right lower leg pain.  EXAM: RIGHT TIBIA AND FIBULA - 2 VIEW  COMPARISON:  MRI right knee 12/31/2012.  FINDINGS: The patient has remote healed fractures of the distal right femur, proximal right tibia and fibula and distal right tibia and fibula. No acute bony or joint abnormality is identified. Bones are markedly osteopenic.  IMPRESSION: No acute finding.  Multiple remote fractures as above.   Electronically Signed   By: Inge Rise M.D.   On: 08/28/2013 23:16   Dg Chest Portable 1 View  08/29/2013   CLINICAL DATA:  Fall with hip and knee pain.  EXAM: PORTABLE CHEST - 1 VIEW  COMPARISON:  Chest x-ray from yesterday.  FINDINGS: Chronic elevation of the right diaphragm. Widening of the lower mediastinum which may represent hiatal hernia given surgical changes in the region. Chronic  cardiomegaly. No edema, effusion, or pneumothorax.  IMPRESSION: Stable exam.  No edema or definite pneumonia.   Electronically Signed   By: Jorje Guild M.D.   On: 08/29/2013 06:30   Dg Shoulder Left  08/28/2013   CLINICAL DATA:  Fall.  Right shoulder pain.  EXAM: LEFT SHOULDER - 2+ VIEW  COMPARISON:  None.  FINDINGS: No acute bony or joint abnormality is identified. Degenerative disease about the right shoulder is seen. Image right lung and ribs are unremarkable.  IMPRESSION: No acute finding.   Electronically Signed   By: Inge Rise M.D.   On: 08/28/2013 23:17    Scheduled Meds: . amLODipine  5 mg Oral QHS  . atorvastatin  10 mg Oral QHS  . baclofen  20 mg Oral TID  . cefTRIAXone (ROCEPHIN)  IV  1 g Intravenous Once  . cefTRIAXone (ROCEPHIN)  IV  1 g Intravenous Q24H  . Chlorhexidine Gluconate Cloth  6 each Topical Q0600  . DULoxetine  30 mg Oral QHS  . enoxaparin (LOVENOX) injection  40 mg Subcutaneous Q24H  . feeding supplement (PRO-STAT SUGAR FREE 64)  30 mL Oral TID WC  . folic acid  1 mg Oral Daily  . lamoTRIgine  200 mg Oral BID  . multivitamin  5 mL Oral Daily  . mupirocin ointment  1 application Nasal BID  . nutrition supplement (JUVEN)  1 packet Oral BID BM  . oxybutynin  5 mg Oral Daily  . pantoprazole  40 mg Oral Custom  . potassium chloride  10 mEq Oral Daily  . pregabalin  75  mg Oral BID  . QUEtiapine  50 mg Oral QHS  . senna-docusate  1 tablet Oral Daily   Continuous Infusions: . sodium chloride 50 mL/hr at 08/29/13 0404    Time spent: >30 minutes   Homer Pfeifer  [facility] Pager [phone]. If 7PM-7AM, please contact night-coverage at [email or web address], password [facility] 08/30/2013, 11:24 AM  LOS: 2 days

## 2013-08-30 NOTE — Progress Notes (Signed)
PT ordered nwb rle Bed to chair transfers ok to try and rolling for wound care ok

## 2013-08-30 NOTE — Progress Notes (Signed)
Pt stated he usually uses a = 18 Fr foley size. Dr. Dyann Kief made aware of  Need forurology consult aslo as per foley assessment order on work list.  MD instructed will call urology c/c in am.  Pt made aware and verbalized understanding.  Karie Kirks, Therapist, sports.

## 2013-08-30 NOTE — Progress Notes (Signed)
The patient was yelling concerning his pain level and he demanded that his Foley catheter be replaced.  He stated that his Foley catheter had been replaced 3 times in the last 3 weeks.  The Foley catheter is draining appropriately.  K. Schorr was notified of the patient's requests.  New orders given for a one-time dose of 0.5 mg of IV Dilaudid for breakthrough pain, PRN Urelle for bladder spasms, and PRN Toradol.  She stated that the Foley would not be replaced at this time.

## 2013-08-30 NOTE — Progress Notes (Signed)
No change in d/c plan. Will return to [facility] when stable. Condition updated with Medical illustrator. Will return when stable.  Donnella Sham, [place] Intern [phone]

## 2013-08-30 NOTE — Progress Notes (Signed)
I have reviewed and agree with BSW Intern's note. Lorie Phenix. [place], [place]

## 2013-08-31 ENCOUNTER — Inpatient Hospital Stay (HOSPITAL_COMMUNITY): Payer: PRIVATE HEALTH INSURANCE

## 2013-08-31 DIAGNOSIS — S72143A Displaced intertrochanteric fracture of unspecified femur, initial encounter for closed fracture: Principal | ICD-10-CM

## 2013-08-31 DIAGNOSIS — I10 Essential (primary) hypertension: Secondary | ICD-10-CM

## 2013-08-31 DIAGNOSIS — E876 Hypokalemia: Secondary | ICD-10-CM

## 2013-08-31 DIAGNOSIS — N179 Acute kidney failure, unspecified: Secondary | ICD-10-CM

## 2013-08-31 DIAGNOSIS — N390 Urinary tract infection, site not specified: Secondary | ICD-10-CM

## 2013-08-31 LAB — BASIC METABOLIC PANEL
BUN: 12 mg/dL (ref 6–23)
CALCIUM: 7.9 mg/dL — AB (ref 8.4–10.5)
CO2: 24 mEq/L (ref 19–32)
Chloride: 106 mEq/L (ref 96–112)
Creatinine, Ser: 0.86 mg/dL (ref 0.50–1.35)
GFR calc Af Amer: >90 mL/min (ref >90)
Glucose, Bld: 117 mg/dL — ABNORMAL HIGH (ref 70–99)
Potassium: 3.5 mEq/L — ABNORMAL LOW (ref 3.7–5.3)
SODIUM: 140 meq/L (ref 137–147)

## 2013-08-31 LAB — CBC
HCT: 24.2 % — ABNORMAL LOW (ref 39.0–52.0)
HEMATOCRIT: 24.4 % — AB (ref 39.0–52.0)
HEMOGLOBIN: 7.8 g/dL — AB (ref 13.0–17.0)
HEMOGLOBIN: 7.9 g/dL — AB (ref 13.0–17.0)
MCH: 26.1 pg (ref 26.0–34.0)
MCH: 26 pg (ref 26.0–34.0)
MCHC: 32.2 g/dL (ref 30.0–36.0)
MCHC: 32.4 g/dL (ref 30.0–36.0)
MCV: 80.9 fL (ref 78.0–100.0)
MCV: 80.3 fL (ref 78.0–100.0)
Platelets: 163 10*3/uL (ref 150–400)
Platelets: 177 10*3/uL (ref 150–400)
RBC: 2.99 MIL/uL — ABNORMAL LOW (ref 4.22–5.81)
RBC: 3.04 MIL/uL — AB (ref 4.22–5.81)
RDW: 17.1 % — AB (ref 11.5–15.5)
RDW: 17 % — ABNORMAL HIGH (ref 11.5–15.5)
WBC: 10.9 10*3/uL — ABNORMAL HIGH (ref 4.0–10.5)
WBC: 10.1 10*3/uL (ref 4.0–10.5)

## 2013-08-31 LAB — URINE CULTURE: Colony Count: >=100000

## 2013-08-31 LAB — PREPARE RBC (CROSSMATCH)

## 2013-08-31 NOTE — Progress Notes (Signed)
Lab  unable to draw blood for  Type and screen, asked to page IV team.  Pt had went down to radiology for test ordered.  IV team made aware pt is back to have blood drawn for type and screen. Karie Kirks, Therapist, sports.

## 2013-08-31 NOTE — Progress Notes (Signed)
Foley catheter is leaking and complaining that it needs to be changed. Took off foley catheter and inserted aseptically a new one using FC Fr 18 as per pt's desired. Will continue monitoring

## 2013-08-31 NOTE — Progress Notes (Signed)
Pt declined to signing blood consent/and having blood administered,1 unit as ordered.  States "I don't want any blood until my about my ct scan result."  Dr. Durel Salts made aware and instructed that he will call pt and talk with him.  Will continue to monitor.  Karie Kirks, Therapist, sports.

## 2013-08-31 NOTE — Progress Notes (Signed)
[facility] PROGRESS NOTE Interim History: 58 yo male with history of left AKA, wheelchair bound, presented to [facility] ED with main concern of sudden onset right hip pain that occurred shortly after he has fell out from his wheelchair. Pt describes pain as constant and throbbing, non radiating, 10/10 in severity, worse with movement and with no specific alleviating factors. Pt explains he has not had any prior specific symptoms such as dizziness, no chest pain or shortness of breath. Pt denies fevers, chills, no specific urinary concerns other than dysuria.   Assessment/Plan: Right hip fracture after falling from wheelchair: - Ortho team consulted, plan is to observe conservatively and ask PT to evaluate capacity to tolerate transfers with ongoing pain. - Pt cannot transfer to wheelchair due to significant pain. Will d/w Dr. Marlou Sa.  ? UTI - Will continue empiric Rocephin and will follow up on urine culture  - Exchange foley catheter.  Anemia of chronic disease  - Hg has drop significantly. - FOBT, check CT of hip rule out bleed.  History of PSA  - Alcohol, tobacco, narcotics  - pt denies any illicit substance abuse and reports he quit smoking and alcohol use 5 years ago  - Will be cautious with pain meds.  Hypokalemia  - Repleted   Leukocytosis  - Secondary to demargination from hip fracture and UTI  - ABX Rocephin,  - Improving, UC showed > 100K proteus Mirabilis.  Decubitus ulcer  - Dry gauze packed into the wound, will switch to silver hydrofiber for recalcitrant wound. Change every other day starting tom.   Protein calorie malnutrition (severe) and hypoalbuminemia:  - Continue prostat   Code Status: Full  Family Communication: no family at bedside  Disposition Plan: back to SNF when medically stable.    Consultants:  Ortho  Procedures:  CT  Antibiotics:  Rocephin  HPI/Subjective: Complaining of significant amount of pain when trying to transfer to chair,  unbearable.  Objective: Filed Vitals:   08/30/13 2013 08/31/13 0000 08/31/13 0400 08/31/13 0536  BP: 112/72   103/58  Pulse: 100   73  Temp: 98.4 F (36.9 C)   98.9 F (37.2 C)  TempSrc: Oral   Oral  Resp: 18 18 18 18   Height:      Weight:    109.317 kg (241 lb)  SpO2: 94% 96% 100% 96%    Intake/Output Summary (Last 24 hours) at 08/31/13 1003 Last data filed at 08/31/13 0700  Gross per 24 hour  Intake 2936.67 ml  Output    950 ml  Net 1986.67 ml   Filed Weights   08/29/13 0526 08/30/13 0430 08/31/13 0536  Weight: 109.4 kg (241 lb 2.9 oz) 110.315 kg (243 lb 3.2 oz) 109.317 kg (241 lb)    Exam:  General: Alert, awake, oriented x3, in no acute distress.  HEENT: No bruits, no goiter.  Heart: Regular rate and rhythm, without murmurs, rubs, gallops.  Lungs: Good air movement, CTA B/L. Abdomen: Soft, nontender, nondistended, positive bowel sounds.     Data Reviewed: Basic Metabolic Panel:  Recent Labs Lab 08/28/13 2220 08/30/13 0440 08/31/13 0424  NA 141 140 140  K 3.5* 4.2 3.5*  CL 103 105 106  CO2  --  23 24  GLUCOSE 120* 135* 117*  BUN 7 8 12   CREATININE 0.80 0.70 0.86  CALCIUM  --  8.2* 7.9*   Liver Function Tests:  Recent Labs Lab 08/30/13 0440  AST 19  ALT 15  ALKPHOS 78  BILITOT 0.5  PROT 6.3  ALBUMIN 2.9*   No results found for this basename: LIPASE, AMYLASE,  in the last 168 hours No results found for this basename: AMMONIA,  in the last 168 hours CBC:  Recent Labs Lab 08/28/13 2211 08/28/13 2220 08/31/13 0424  WBC 16.2*  --  10.9*  NEUTROABS 12.9*  --   --   HGB 12.7* 14.3 7.8*  HCT 37.9* 42.0 24.2*  MCV 79.1  --  80.9  PLT 267  --  163   Cardiac Enzymes: No results found for this basename: CKTOTAL, CKMB, CKMBINDEX, TROPONINI,  in the last 168 hours BNP (last 3 results) No results found for this basename: PROBNP,  in the last 8760 hours CBG: No results found for this basename: GLUCAP,  in the last 168 hours  Recent Results  (from the past 240 hour(s))  URINE CULTURE     Status: None   Collection Time    08/28/13  9:56 PM      Result Value Range Status   Specimen Description URINE, CATHETERIZED   Final   Special Requests CX ADDED AT 2227 ON [id number]   Final   Culture  Setup Time     Final   Value: 08/28/2013 23:08     Performed at [place]     Final   Value: >=100,000 COLONIES/ML     Performed at [facility]   Culture     Final   Value: [place]     Performed at [facility]   Report Status PENDING   Incomplete  CULTURE, BLOOD (ROUTINE X 2)     Status: None   Collection Time    08/29/13  1:13 AM      Result Value Range Status   Specimen Description BLOOD RIGHT ANTECUBITAL   Final   Special Requests BOTTLES DRAWN AEROBIC AND ANAEROBIC 5CC EA   Final   Culture  Setup Time     Final   Value: 08/29/2013 08:47     Performed at [facility]   Culture     Final   Value:        BLOOD CULTURE RECEIVED NO GROWTH TO DATE CULTURE WILL BE HELD FOR 5 DAYS BEFORE ISSUING A FINAL NEGATIVE REPORT     Performed at [facility]   Report Status PENDING   Incomplete  SURGICAL PCR SCREEN     Status: Abnormal   Collection Time    08/29/13  4:35 AM      Result Value Range Status   MRSA, 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 LOMAX   Staphylococcus aureus POSITIVE (*) NEGATIVE Final   Comment:            The Xpert SA Assay (FDA     approved for NASAL specimens     in patients over 57 years of age),     is one component of     a comprehensive surveillance     program.  Test performance has     been validated by [employer] for patients greater     than or equal to 38 year old.     It is not intended     to diagnose infection nor to     guide or monitor treatment.     RESULT CALLED TO,  READ BACK BY AND VERIFIED WITH:     Marcy Siren (RN) [phone] 08/29/2013 L.  LOMAX  URINE CULTURE     Status: None   Collection Time    08/30/13  4:58 AM      Result Value Range Status   Specimen Description URINE, CATHETERIZED   Final   Special Requests NONE   Final   Culture  Setup Time     Final   Value: 08/30/2013 05:28     Performed at [place]     Final   Value: >=100,000 COLONIES/ML     Performed at [facility]   Culture     Final   Value: Multiple bacterial morphotypes present, none predominant. Suggest appropriate recollection if clinically indicated.     Performed at [facility]   Report Status 08/31/2013 FINAL   Final     Studies: No results found.  Scheduled Meds: . amLODipine  5 mg Oral QHS  . atorvastatin  10 mg Oral QHS  . baclofen  20 mg Oral TID  . cefTRIAXone (ROCEPHIN)  IV  1 g Intravenous Once  . cefTRIAXone (ROCEPHIN)  IV  1 g Intravenous Q24H  . Chlorhexidine Gluconate Cloth  6 each Topical Q0600  . DULoxetine  30 mg Oral QHS  . enoxaparin (LOVENOX) injection  40 mg Subcutaneous Q24H  . feeding supplement (PRO-STAT SUGAR FREE 64)  30 mL Oral TID WC  . folic acid  1 mg Oral Daily  . lamoTRIgine  200 mg Oral BID  . multivitamin  5 mL Oral Daily  . mupirocin ointment  1 application Nasal BID  . nutrition supplement (JUVEN)  1 packet Oral BID BM  . oxybutynin  5 mg Oral Daily  . pantoprazole  40 mg Oral Custom  . potassium chloride  10 mEq Oral Daily  . pregabalin  75 mg Oral BID  . QUEtiapine  50 mg Oral QHS  . senna-docusate  1 tablet Oral Daily   Continuous Infusions: . sodium chloride 50 mL/hr at 08/30/13 [place], ABRAHAM  [facility] Pager [phone]. If 8PM-8AM, please contact night-coverage at [email or web address], password [facility] 08/31/2013, 10:03 AM  LOS: 3 days

## 2013-08-31 NOTE — Progress Notes (Signed)
Attempted to change urine leg bag to urinary   Drainage bag to gravity.  Pt declined.  Will continue to monitor.  Karie Kirks, Therapist, sports.

## 2013-08-31 NOTE — Evaluation (Signed)
Physical Therapy Evaluation Patient Details Name: Samuel [name] MRN: [id number] DOB: 1956/05/31 Today's Date: 08/31/2013 Time: 1740-8144 PT Time Calculation (min): 40 min  PT Assessment / Plan / Recommendation History of Present Illness    58 yo male with history of left AKA, wheelchair bound, presented to [facility] ED with main concern of sudden onset right hip pain that occurred shortly after he has fell out from his wheelchair. Pt describes pain as constant and throbbing, non radiating, 10/10 in severity, worse with movement and with no specific alleviating factors. Pt explains he has not had any prior specific symptoms such as dizziness, no chest pain or shortness of breath. Pt denies fevers, chills, no specific urinary concerns other than dysuria.  Pt with right hip fracture per Dr. Marlou Sa.  NWB right LE and try transfers to chair.   Clinical Impression  Pt admitted with above. Pt currently with functional limitations due to the deficits listed below (see PT Problem List). Pt directs treatment and is declining to use lift equipment therefore limited in getting OOB.  Pt will benefit from skilled PT to increase their independence and safety with mobility to allow discharge to the venue listed below.     PT Assessment  Patient needs continued PT services    Follow Up Recommendations  SNF;Supervision/Assistance - 24 hour        Barriers to Discharge Decreased caregiver support      Equipment Recommendations  Other (comment) (TBA)         Frequency Min 2X/week    Precautions / Restrictions Precautions Precautions: Fall Restrictions Weight Bearing Restrictions: Yes RLE Weight Bearing: Non weight bearing   Pertinent Vitals/Pain VSS, right LE pain in hip per pt - not rated.      Mobility  Bed Mobility Overal bed mobility: Needs Assistance;+2 for physical assistance Bed Mobility: Rolling;Supine to Sit Rolling: +2 for physical assistance;Max assist Supine to sit: Mod assist;+2 for  physical assistance;HOB elevated (Supine to long sit) General bed mobility comments: Pt able to long sit but could not maintain balance on his own.  Attempted to use pad to move pt to position to do anterior posterior transfer but pt could never get enough pushing power with his UEs.  Determined that we could use pad to get pt in chair but he would not be able to do same transfer back to bed.  Pt refuses to use Maxi Move lift to get back to bed as he says it squeezes his right hip therefore determined that it was not in pt best interest to get OOB at present.   Transfers General transfer comment: Deferred transfer OOB due to pt refuses to use lift to get back in bed.           PT Diagnosis: Generalized weakness;Acute pain  PT Problem List: Decreased activity tolerance;Decreased balance;Decreased mobility;Decreased knowledge of use of DME;Decreased safety awareness;Decreased strength;Decreased knowledge of precautions;Pain PT Treatment Interventions: DME instruction;Functional mobility training;Therapeutic activities;Therapeutic exercise;Balance training;Patient/family education     PT Goals(Current goals can be found in the care plan section) Acute Rehab PT Goals Patient Stated Goal: to return to NH  PT Goal Formulation: With patient Time For Goal Achievement: 09/14/13 Potential to Achieve Goals: Fair  Visit Information  Last PT Received On: 08/31/13 Assistance Needed: +2       Prior Functioning  Home Living Family/patient expects to be discharged to:: Skilled nursing facility Prior Function Level of Independence: Needs assistance Gait / Transfers Assistance Needed: States he got OOB to  wheelchair via anterior posterior transfer. ADL's / Homemaking Assistance Needed: Pt reports he was max A with LB ADLs bed level, and min A to set up for UB Communication / Swallowing Assistance Needed: none Communication Communication: No difficulties Dominant Hand: Right    Cognition   Cognition Arousal/Alertness: Awake/alert Behavior During Therapy: WFL for tasks assessed/performed Overall Cognitive Status: Within Functional Limits for tasks assessed    Extremity/Trunk Assessment Upper Extremity Assessment Upper Extremity Assessment: Defer to OT evaluation Lower Extremity Assessment Lower Extremity Assessment: RLE deficits/detail;LLE deficits/detail RLE: Unable to fully assess due to pain LLE Deficits / Details: AKA   Balance Balance Overall balance assessment: Needs assistance;History of Falls Sitting-balance support: Bilateral upper extremity supported;Feet unsupported Sitting balance-Leahy Scale: Poor Sitting balance - Comments: Loses balance posteriorly.  Cannot sit with out mod asssit.  Postural control: Posterior lean;Left lateral lean  End of Session PT - End of Session Equipment Utilized During Treatment: Gait belt;Oxygen Activity Tolerance: Patient limited by fatigue;Patient limited by pain Patient left: in bed;with call bell/phone within reach Nurse Communication: Mobility status;Need for lift equipment (Pt needs to use lift equipment but refuses)       INGOLD,Xylia Scherger 08/31/2013, 12:24 PM [facility] Acute Rehabilitation [phone] (pager)

## 2013-08-31 NOTE — Progress Notes (Signed)
F/u if pt foley can be changed by urologist with Dr. Frazier Richards.  Instructed that he has not call urologist yet.  Pt made aware.  Will continue to monitor.  Karie Kirks, Therapist, sports.

## 2013-09-01 ENCOUNTER — Encounter (HOSPITAL_COMMUNITY): Payer: Self-pay | Admitting: Anesthesiology

## 2013-09-01 ENCOUNTER — Encounter (HOSPITAL_COMMUNITY)
Admission: EM | Disposition: A | Discharge status: Skilled Nursing Facility | Payer: Self-pay | Source: Home / Self Care | Attending: Internal Medicine

## 2013-09-01 ENCOUNTER — Encounter (HOSPITAL_BASED_OUTPATIENT_CLINIC_OR_DEPARTMENT_OTHER): Payer: Medicare Other

## 2013-09-01 ENCOUNTER — Encounter (HOSPITAL_COMMUNITY): Payer: PRIVATE HEALTH INSURANCE | Admitting: Anesthesiology

## 2013-09-01 ENCOUNTER — Inpatient Hospital Stay (HOSPITAL_COMMUNITY): Payer: PRIVATE HEALTH INSURANCE | Admitting: Anesthesiology

## 2013-09-01 ENCOUNTER — Inpatient Hospital Stay (HOSPITAL_COMMUNITY): Payer: PRIVATE HEALTH INSURANCE

## 2013-09-01 DIAGNOSIS — D62 Acute posthemorrhagic anemia: Secondary | ICD-10-CM | POA: Diagnosis present

## 2013-09-01 DIAGNOSIS — M25559 Pain in unspecified hip: Secondary | ICD-10-CM | POA: Diagnosis not present

## 2013-09-01 DIAGNOSIS — S72143A Displaced intertrochanteric fracture of unspecified femur, initial encounter for closed fracture: Secondary | ICD-10-CM | POA: Diagnosis not present

## 2013-09-01 HISTORY — PX: INTRAMEDULLARY (IM) NAIL INTERTROCHANTERIC: SHX5875

## 2013-09-01 LAB — CBC
HCT: 24 % — ABNORMAL LOW (ref 39.0–52.0)
HCT: 28.6 % — ABNORMAL LOW (ref 39.0–52.0)
HEMOGLOBIN: 9.7 g/dL — AB (ref 13.0–17.0)
Hemoglobin: 7.8 g/dL — ABNORMAL LOW (ref 13.0–17.0)
MCH: 26.2 pg (ref 26.0–34.0)
MCH: 27.8 pg (ref 26.0–34.0)
MCHC: 32.5 g/dL (ref 30.0–36.0)
MCHC: 33.9 g/dL (ref 30.0–36.0)
MCV: 80.5 fL (ref 78.0–100.0)
MCV: 81.9 fL (ref 78.0–100.0)
Platelets: 200 10*3/uL (ref 150–400)
Platelets: 200 10*3/uL (ref 150–400)
RBC: 2.98 MIL/uL — AB (ref 4.22–5.81)
RBC: 3.49 MIL/uL — ABNORMAL LOW (ref 4.22–5.81)
RDW: 16.9 % — ABNORMAL HIGH (ref 11.5–15.5)
RDW: 15.7 % — AB (ref 11.5–15.5)
WBC: 9.7 10*3/uL (ref 4.0–10.5)
WBC: 24.4 10*3/uL — ABNORMAL HIGH (ref 4.0–10.5)

## 2013-09-01 LAB — POCT I-STAT 7, (LYTES, BLD GAS, ICA,H+H)
Acid-base deficit: 3 mmol/L — ABNORMAL HIGH (ref 0.0–2.0)
Bicarbonate: 21 mEq/L (ref 20.0–24.0)
Calcium, Ion: 1.09 mmol/L — ABNORMAL LOW (ref 1.12–1.23)
HCT: 29 % — ABNORMAL LOW (ref 39.0–52.0)
HEMOGLOBIN: 9.9 g/dL — AB (ref 13.0–17.0)
O2 Saturation: 99 %
PCO2 ART: 32.2 mmHg — AB (ref 35.0–45.0)
PH ART: 7.422 (ref 7.350–7.450)
POTASSIUM: 3.5 meq/L — AB (ref 3.7–5.3)
Sodium: 138 mEq/L (ref 137–147)
TCO2: 22 mmol/L (ref 0–100)
pO2, Arterial: 120 mmHg — ABNORMAL HIGH (ref 80.0–100.0)

## 2013-09-01 LAB — PREPARE RBC (CROSSMATCH)

## 2013-09-01 SURGERY — FIXATION, FRACTURE, INTERTROCHANTERIC, WITH INTRAMEDULLARY ROD
Anesthesia: General | Site: Hip | Laterality: Right

## 2013-09-01 MED ORDER — MIDAZOLAM HCL 5 MG/5ML IJ SOLN
INTRAMUSCULAR | Status: DC | PRN
  Administered 2013-09-01: 2 mg via INTRAVENOUS

## 2013-09-01 MED ORDER — GLYCOPYRROLATE 0.2 MG/ML IJ SOLN
INTRAMUSCULAR | Status: DC | PRN
  Administered 2013-09-01: 0.4 mg via INTRAVENOUS

## 2013-09-01 MED ORDER — ROCURONIUM BROMIDE 100 MG/10ML IV SOLN
INTRAVENOUS | Status: DC | PRN
  Administered 2013-09-01: 50 mg via INTRAVENOUS
  Administered 2013-09-01: 10 mg via INTRAVENOUS

## 2013-09-01 MED ORDER — HEMOSTATIC AGENTS (NO CHARGE) OPTIME
TOPICAL | Status: DC | PRN
  Administered 2013-09-01: 1 via TOPICAL

## 2013-09-01 MED ORDER — MENTHOL 3 MG MT LOZG
1.0000 | LOZENGE | OROMUCOSAL | Status: DC | PRN
  Filled 2013-09-01: qty 9

## 2013-09-01 MED ORDER — HYDROMORPHONE HCL PF 1 MG/ML IJ SOLN
0.2500 mg | INTRAMUSCULAR | Status: DC | PRN
  Administered 2013-09-01 (×2): 0.5 mg via INTRAVENOUS

## 2013-09-01 MED ORDER — PHENYLEPHRINE HCL 10 MG/ML IJ SOLN
INTRAMUSCULAR | Status: DC | PRN
  Administered 2013-09-01 (×2): 80 ug via INTRAVENOUS
  Administered 2013-09-01: 40 ug via INTRAVENOUS

## 2013-09-01 MED ORDER — METOCLOPRAMIDE HCL 5 MG/ML IJ SOLN
10.0000 mg | Freq: Once | INTRAMUSCULAR | Status: DC | PRN
Start: 2013-09-01 — End: 2013-09-01
  Filled 2013-09-01: qty 2

## 2013-09-01 MED ORDER — NEOSTIGMINE METHYLSULFATE 1 MG/ML IJ SOLN
INTRAMUSCULAR | Status: DC | PRN
  Administered 2013-09-01: 3 mg via INTRAVENOUS

## 2013-09-01 MED ORDER — METOCLOPRAMIDE HCL 5 MG PO TABS
5.0000 mg | ORAL_TABLET | Freq: Three times a day (TID) | ORAL | Status: DC | PRN
  Administered 2013-09-02: 10 mg via ORAL
  Filled 2013-09-01: qty 2

## 2013-09-01 MED ORDER — OXYCODONE HCL 5 MG/5ML PO SOLN: 5.0000 mg | Freq: Once | ORAL | Status: DC | PRN

## 2013-09-01 MED ORDER — PROPOFOL 10 MG/ML IV BOLUS
INTRAVENOUS | Status: AC
  Filled 2013-09-01: qty 20

## 2013-09-01 MED ORDER — SODIUM CHLORIDE 0.9 % IV SOLN
INTRAVENOUS | Status: DC | PRN
  Administered 2013-09-01: 19:00:00 via INTRAVENOUS

## 2013-09-01 MED ORDER — PHENOL 1.4 % MT LIQD: 1.0000 | OROMUCOSAL | Status: DC | PRN

## 2013-09-01 MED ORDER — MIDAZOLAM HCL 2 MG/2ML IJ SOLN
INTRAMUSCULAR | Status: AC
  Filled 2013-09-01: qty 2

## 2013-09-01 MED ORDER — PROPOFOL 10 MG/ML IV BOLUS
INTRAVENOUS | Status: DC | PRN
  Administered 2013-09-01: 200 mg via INTRAVENOUS

## 2013-09-01 MED ORDER — 0.9 % SODIUM CHLORIDE (POUR BTL) OPTIME
TOPICAL | Status: DC | PRN
  Administered 2013-09-01: 2000 mL

## 2013-09-01 MED ORDER — HYDROMORPHONE HCL PF 1 MG/ML IJ SOLN
INTRAMUSCULAR | Status: AC
  Filled 2013-09-01: qty 1

## 2013-09-01 MED ORDER — ONDANSETRON HCL 4 MG/2ML IJ SOLN
INTRAMUSCULAR | Status: AC
  Filled 2013-09-01: qty 2

## 2013-09-01 MED ORDER — ONDANSETRON HCL 4 MG PO TABS
4.0000 mg | ORAL_TABLET | Freq: Four times a day (QID) | ORAL | Status: DC | PRN
  Administered 2013-09-02 – 2013-09-05 (×4): 4 mg via ORAL
  Filled 2013-09-01 (×4): qty 1

## 2013-09-01 MED ORDER — ARTIFICIAL TEARS OP OINT
TOPICAL_OINTMENT | OPHTHALMIC | Status: AC
  Filled 2013-09-01: qty 3.5

## 2013-09-01 MED ORDER — ALBUMIN HUMAN 5 % IV SOLN
INTRAVENOUS | Status: DC | PRN
  Administered 2013-09-01 (×2): via INTRAVENOUS

## 2013-09-01 MED ORDER — HYDROCODONE-ACETAMINOPHEN 5-325 MG PO TABS
1.0000 | ORAL_TABLET | Freq: Four times a day (QID) | ORAL | Status: DC | PRN
  Administered 2013-09-02 – 2013-09-05 (×9): 2 via ORAL
  Filled 2013-09-01 (×11): qty 2

## 2013-09-01 MED ORDER — POTASSIUM CHLORIDE IN NACL 20-0.9 MEQ/L-% IV SOLN
INTRAVENOUS | Status: AC
  Administered 2013-09-02: via INTRAVENOUS
  Filled 2013-09-01 (×4): qty 1000

## 2013-09-01 MED ORDER — ACETAMINOPHEN 650 MG RE SUPP
650.0000 mg | Freq: Four times a day (QID) | RECTAL | Status: DC | PRN
  Filled 2013-09-01: qty 1

## 2013-09-01 MED ORDER — FENTANYL CITRATE 0.05 MG/ML IJ SOLN
INTRAMUSCULAR | Status: DC | PRN
  Administered 2013-09-01 (×2): 100 ug via INTRAVENOUS
  Administered 2013-09-01: 50 ug via INTRAVENOUS

## 2013-09-01 MED ORDER — LIDOCAINE HCL (CARDIAC) 20 MG/ML IV SOLN
INTRAVENOUS | Status: DC | PRN
  Administered 2013-09-01: 60 mg via INTRAVENOUS

## 2013-09-01 MED ORDER — HYDROMORPHONE HCL PF 1 MG/ML IJ SOLN: 0.2500 mg | INTRAMUSCULAR | Status: DC | PRN

## 2013-09-01 MED ORDER — OXYCODONE HCL 5 MG PO TABS: 5.0000 mg | ORAL_TABLET | Freq: Once | ORAL | Status: DC | PRN

## 2013-09-01 MED ORDER — MORPHINE SULFATE 2 MG/ML IJ SOLN
0.5000 mg | INTRAMUSCULAR | Status: DC | PRN
  Administered 2013-09-01 – 2013-09-02 (×2): 0.5 mg via INTRAVENOUS
  Filled 2013-09-01 (×2): qty 1

## 2013-09-01 MED ORDER — ACETAMINOPHEN 325 MG PO TABS
650.0000 mg | ORAL_TABLET | Freq: Four times a day (QID) | ORAL | Status: DC | PRN
  Administered 2013-09-02 – 2013-09-04 (×3): 650 mg via ORAL
  Filled 2013-09-01 (×3): qty 2

## 2013-09-01 MED ORDER — LACTATED RINGERS IV SOLN
INTRAVENOUS | Status: DC | PRN
  Administered 2013-09-01: 17:00:00 via INTRAVENOUS

## 2013-09-01 MED ORDER — PHENYLEPHRINE HCL 10 MG/ML IJ SOLN
10.0000 mg | INTRAVENOUS | Status: DC | PRN
  Administered 2013-09-01: 50 ug/min via INTRAVENOUS

## 2013-09-01 MED ORDER — METOCLOPRAMIDE HCL 5 MG/ML IJ SOLN
5.0000 mg | Freq: Three times a day (TID) | INTRAMUSCULAR | Status: DC | PRN
  Administered 2013-09-03: 10 mg via INTRAVENOUS
  Filled 2013-09-01 (×3): qty 2

## 2013-09-01 MED ORDER — FENTANYL CITRATE 0.05 MG/ML IJ SOLN
INTRAMUSCULAR | Status: AC
  Filled 2013-09-01: qty 5

## 2013-09-01 MED ORDER — ONDANSETRON HCL 4 MG/2ML IJ SOLN
4.0000 mg | Freq: Four times a day (QID) | INTRAMUSCULAR | Status: DC | PRN
  Administered 2013-09-01 – 2013-09-04 (×5): 4 mg via INTRAVENOUS
  Filled 2013-09-01 (×5): qty 2

## 2013-09-01 SURGICAL SUPPLY — 57 items
BIT DRILL 4.3MMS DISTAL GRDTED (BIT) ×1 IMPLANT
BLADE SURG 15 STRL LF DISP TIS (BLADE) ×1 IMPLANT
BLADE SURG 15 STRL SS (BLADE) ×2
BLADE SURG ROTATE 9660 (MISCELLANEOUS) IMPLANT
CLOTH BEACON ORANGE TIMEOUT ST (SAFETY) IMPLANT
COVER SURGICAL LIGHT HANDLE (MISCELLANEOUS) ×3 IMPLANT
DRAPE ORTHO SPLIT 77X108 STRL (DRAPES) ×4
DRAPE PROXIMA HALF (DRAPES) ×3 IMPLANT
DRAPE STERI IOBAN 125X83 (DRAPES) ×3 IMPLANT
DRAPE SURG ORHT 6 SPLT 77X108 (DRAPES) ×2 IMPLANT
DRILL 4.3MMS DISTAL GRADUATED (BIT) ×3
DRSG MEPILEX BORDER 4X4 (GAUZE/BANDAGES/DRESSINGS) ×3 IMPLANT
DRSG MEPILEX BORDER 4X8 (GAUZE/BANDAGES/DRESSINGS) ×3 IMPLANT
DURAPREP 26ML APPLICATOR (WOUND CARE) ×3 IMPLANT
ELECT REM PT RETURN 9FT ADLT (ELECTROSURGICAL) ×3
ELECTRODE REM PT RTRN 9FT ADLT (ELECTROSURGICAL) ×1 IMPLANT
FACESHIELD LNG OPTICON STERILE (SAFETY) ×3 IMPLANT
GAUZE XEROFORM 5X9 LF (GAUZE/BANDAGES/DRESSINGS) ×3 IMPLANT
GLOVE BIOGEL PI IND STRL 8 (GLOVE) ×1 IMPLANT
GLOVE BIOGEL PI INDICATOR 8 (GLOVE) ×2
GLOVE SURG ORTHO 8.0 STRL STRW (GLOVE) ×6 IMPLANT
GOWN PREVENTION PLUS LG XLONG (DISPOSABLE) IMPLANT
GOWN PREVENTION PLUS XLARGE (GOWN DISPOSABLE) ×9 IMPLANT
GOWN STRL NON-REIN LRG LVL3 (GOWN DISPOSABLE) IMPLANT
GUIDE PIN 3.2 LONG (PIN) ×3 IMPLANT
GUIDE PIN 3.2MM (MISCELLANEOUS) ×2
GUIDE PIN ORTH 343X3.2XBRAD (MISCELLANEOUS) ×1 IMPLANT
GUIDEPIN 3.2X17.5 THRD DISP (PIN) ×6 IMPLANT
GUIDEWIRE BALL NOSE 100CM (WIRE) ×3 IMPLANT
HFN LAG SCREW 10.5MM X 85MM (Screw) ×3 IMPLANT
KIT BASIN OR (CUSTOM PROCEDURE TRAY) ×3 IMPLANT
KIT ROOM TURNOVER OR (KITS) ×3 IMPLANT
MANIFOLD NEPTUNE II (INSTRUMENTS) ×3 IMPLANT
NAIL HIP FX RT 11X320MM-130 (Nail) ×3 IMPLANT
NS IRRIG 1000ML POUR BTL (IV SOLUTION) ×3 IMPLANT
PACK GENERAL/GYN (CUSTOM PROCEDURE TRAY) ×3 IMPLANT
PAD ARMBOARD 7.5X6 YLW CONV (MISCELLANEOUS) ×9 IMPLANT
SCREW ANTI ROTATION  60MM (Screw) ×2 IMPLANT
SCREW ANTI ROTATION 60MM (Screw) ×1 IMPLANT
SCREW BONE CORTICAL 5.0X42 (Screw) ×3 IMPLANT
SCREW DRILL BIT ANIT ROTATION (BIT) ×3 IMPLANT
SCREWDRIVER HEX TIP 3.5MM (MISCELLANEOUS) ×3 IMPLANT
SPONGE LAP 4X18 X RAY DECT (DISPOSABLE) IMPLANT
SPONGE SURGIFOAM ABS GEL 100 (HEMOSTASIS) ×3 IMPLANT
STAPLER VISISTAT 35W (STAPLE) ×3 IMPLANT
SUT ETHILON 2 0 FS 18 (SUTURE) IMPLANT
SUT VIC AB 0 CT1 27 (SUTURE) ×2
SUT VIC AB 0 CT1 27XBRD ANBCTR (SUTURE) ×1 IMPLANT
SUT VIC AB 1 CT1 27 (SUTURE) ×10
SUT VIC AB 1 CT1 27XBRD ANBCTR (SUTURE) ×5 IMPLANT
SUT VIC AB 1 CTX 36 (SUTURE) ×6
SUT VIC AB 1 CTX36XBRD ANBCTR (SUTURE) ×3 IMPLANT
SUT VIC AB 2-0 CTB1 (SUTURE) ×6 IMPLANT
TAPE STRIPS DRAPE STRL (GAUZE/BANDAGES/DRESSINGS) ×3 IMPLANT
TOWEL OR 17X24 6PK STRL BLUE (TOWEL DISPOSABLE) ×3 IMPLANT
TOWEL OR 17X26 10 PK STRL BLUE (TOWEL DISPOSABLE) ×3 IMPLANT
WATER STERILE IRR 1000ML POUR (IV SOLUTION) ×3 IMPLANT

## 2013-09-01 NOTE — Progress Notes (Signed)
[facility] PROGRESS NOTE Interim History: 58 yo male with history of left AKA, wheelchair bound, presented to [facility] ED with main concern of sudden onset right hip pain that occurred shortly after he has fell out from his wheelchair. Pt describes pain as constant and throbbing, non radiating, 10/10 in severity, worse with movement and with no specific alleviating factors. Pt explains he has not had any prior specific symptoms such as dizziness, no chest pain or shortness of breath. Pt denies fevers, chills, no specific urinary concerns other than dysuria.   Assessment/Plan: Right hip fracture after falling from wheelchair: - Pt cannot transfer to wheelchair due to significant pain. Will d/w Dr. Marlou Sa. - bleeding in to thigh. - Hip CT showed 2.4.2015: Large right adductor hematoma , upper thigh with an underlying small cm hematoma  ? UTI - Will continue empiric Rocephin and will follow up on urine culture  - Exchange foley catheter.  Anemia of chronic disease  - Hg has drop significantly despite 1 unit of PRBC will go ahead and additional  2 units. - not on heparin or ASA. - FOBT.  History of PSA  - Alcohol, tobacco, narcotics  - pt denies any illicit substance abuse and reports he quit smoking and alcohol use 5 years ago  - Will be cautious with pain meds.  Hypokalemia  - Repleted   Leukocytosis  - Secondary to demargination from hip fracture and UTI  - ABX Rocephin,  - Improving, UC showed > 100K proteus Mirabilis.  Decubitus ulcer  - Dry gauze packed into the wound, will switch to silver hydrofiber for recalcitrant wound. Change every other day starting tom.   Protein calorie malnutrition (severe) and hypoalbuminemia:  - Continue prostat   Code Status: Full  Family Communication: no family at bedside  Disposition Plan: back to SNF when medically stable.    Consultants:  Ortho  Procedures:  CT  Antibiotics:  Rocephin  HPI/Subjective: Complaining of  significant amount of pain when trying to transfer to chair.  Objective: Filed Vitals:   08/31/13 2350 09/01/13 0000 09/01/13 0400 09/01/13 0703  BP: 96/57   143/85  Pulse: 86   87  Temp: 98.1 F (36.7 C)   98.1 F (36.7 C)  TempSrc: Oral   Oral  Resp: 18 18 18 18   Height:      Weight:    112 kg (246 lb 14.6 oz)  SpO2:  96% 96% 98%    Intake/Output Summary (Last 24 hours) at 09/01/13 0751 Last data filed at 09/01/13 0709  Gross per 24 hour  Intake 2237.5 ml  Output    775 ml  Net 1462.5 ml   Filed Weights   08/30/13 0430 08/31/13 0536 09/01/13 0703  Weight: 110.315 kg (243 lb 3.2 oz) 109.317 kg (241 lb) 112 kg (246 lb 14.6 oz)    Exam:  General: Alert, awake, oriented x3, in no acute distress.  HEENT: No bruits, no goiter.  Heart: Regular rate and rhythm, without murmurs, rubs, gallops.  Lungs: Good air movement, CTA B/L. Abdomen: Soft, nontender, nondistended, positive bowel sounds.     Data Reviewed: Basic Metabolic Panel:  Recent Labs Lab 08/28/13 2220 08/30/13 0440 08/31/13 0424  NA 141 140 140  K 3.5* 4.2 3.5*  CL 103 105 106  CO2  --  23 24  GLUCOSE 120* 135* 117*  BUN 7 8 12   CREATININE 0.80 0.70 0.86  CALCIUM  --  8.2* 7.9*   Liver Function Tests:  Recent Labs  Lab 08/30/13 0440  AST 19  ALT 15  ALKPHOS 78  BILITOT 0.5  PROT 6.3  ALBUMIN 2.9*   No results found for this basename: LIPASE, AMYLASE,  in the last 168 hours No results found for this basename: AMMONIA,  in the last 168 hours CBC:  Recent Labs Lab 08/28/13 2211 08/28/13 2220 08/31/13 0424 08/31/13 1130 09/01/13 0425  WBC 16.2*  --  10.9* 10.1 9.7  NEUTROABS 12.9*  --   --   --   --   HGB 12.7* 14.3 7.8* 7.9* 7.8*  HCT 37.9* 42.0 24.2* 24.4* 24.0*  MCV 79.1  --  80.9 80.3 80.5  PLT 267  --  163 177 200   Cardiac Enzymes: No results found for this basename: CKTOTAL, CKMB, CKMBINDEX, TROPONINI,  in the last 168 hours BNP (last 3 results) No results found for this  basename: PROBNP,  in the last 8760 hours CBG: No results found for this basename: GLUCAP,  in the last 168 hours  Recent Results (from the past 240 hour(s))  URINE CULTURE     Status: None   Collection Time    08/28/13  9:56 PM      Result Value Range Status   Specimen Description URINE, CATHETERIZED   Final   Special Requests CX ADDED AT 2227 ON [id number]   Final   Culture  Setup Time     Final   Value: 08/28/2013 23:08     Performed at [place]     Final   Value: >=100,000 COLONIES/ML     Performed at [facility]   Culture     Final   Value: PROTEUS MIRABILIS     Performed at [facility]   Report Status 08/31/2013 FINAL   Final   Organism ID, Bacteria PROTEUS MIRABILIS   Final  CULTURE, BLOOD (ROUTINE X 2)     Status: None   Collection Time    08/29/13  1:13 AM      Result Value Range Status   Specimen Description BLOOD RIGHT ANTECUBITAL   Final   Special Requests BOTTLES DRAWN AEROBIC AND ANAEROBIC 5CC EA   Final   Culture  Setup Time     Final   Value: 08/29/2013 08:47     Performed at [facility]   Culture     Final   Value:        BLOOD CULTURE RECEIVED NO GROWTH TO DATE CULTURE WILL BE HELD FOR 5 DAYS BEFORE ISSUING A FINAL NEGATIVE REPORT     Performed at [facility]   Report Status PENDING   Incomplete  SURGICAL PCR SCREEN     Status: Abnormal   Collection Time    08/29/13  4:35 AM      Result Value Range Status   MRSA, PCR POSITIVE (*) NEGATIVE Final   Comment: RESULT CALLED TO, READ BACK BY AND VERIFIED WITH:     Marcy Siren (RN) [phone] 08/29/2013 L. LOMAX   Staphylococcus aureus POSITIVE (*) NEGATIVE Final   Comment:            The Xpert SA Assay (FDA     approved for NASAL specimens     in patients over 56 years of age),     is one component of     a comprehensive surveillance     program.  Test performance has     been validated by [employer] [facility] for patients greater     than or equal to 38 year  old.     It is not intended     to diagnose infection nor to     guide or monitor treatment.     RESULT CALLED TO, READ BACK BY AND VERIFIED WITH:     Marcy Siren (RN) [phone] 08/29/2013 L. LOMAX  URINE CULTURE     Status: None   Collection Time    08/30/13  4:58 AM      Result Value Range Status   Specimen Description URINE, CATHETERIZED   Final   Special Requests NONE   Final   Culture  Setup Time     Final   Value: 08/30/2013 05:28     Performed at [place]     Final   Value: >=100,000 COLONIES/ML     Performed at [facility]   Culture     Final   Value: Multiple bacterial morphotypes present, none predominant. Suggest appropriate recollection if clinically indicated.     Performed at [facility]   Report Status 08/31/2013 FINAL   Final     Studies: Ct Hip Right Wo Contrast  08/31/2013   CLINICAL DATA:  Right hip pain  EXAM: CT OF THE RIGHT HIP WITHOUT CONTRAST  TECHNIQUE: Multidetector CT imaging was performed according to the standard protocol. Multiplanar CT image reconstructions were also generated.  COMPARISON:  DG FEMUR*R* dated 08/28/2013  FINDINGS: There is a comminuted right intertrochanteric fracture with mild displacement. 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 There is a large joint effusion.  There is a large hematoma measuring approximately 9.1 x 3.1 cm in greatest axial dimension and 7.6 cm in greatest craniocaudal dimension. There is a skin defect involving the posterior medial upper thigh with a small hematoma within the subcutaneous fat adjacent to the skin defect with the hematoma measuring approximately 2.8 x 5.2 cm. There is generalized subcutaneous edema along the anterior upper thigh.  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  There are mild degenerative changes of the right SI joint.  There is a  drainage catheter within the subcutaneous fat of the right lower abdomen with a 2.4 x 4.6 cm high attenuation area surrounding the catheter. There is no fluid present within this area.  IMPRESSION: 1. Comminuted right intertrochanteric fracture with mild displacement. No dislocation. 2. Large right adductor hematoma measuring 9.1 x 3.1 x 7.6 cm. There is a skin defect involving the posterior medial upper thigh with an underlying small 2.8 x 5.2 cm hematoma. 3. Drainage catheter within the subcutaneous fat of the right lower abdomen with a 2.4 x 4.6 cm high attenuation area surrounding the catheter which may represent blood products.  4. Large right hip joint effusion which may be reactive. If there is concern regarding infection, recommend arthrocentesis.   Electronically Signed   By: Kathreen Devoid   On: 08/31/2013 15:23    Scheduled Meds: . amLODipine  5 mg Oral QHS  . atorvastatin  10 mg Oral QHS  . baclofen  20 mg Oral TID  . cefTRIAXone (ROCEPHIN)  IV  1 g Intravenous Once  . cefTRIAXone (ROCEPHIN)  IV  1 g Intravenous Q24H  . Chlorhexidine Gluconate Cloth  6 each Topical Q0600  . DULoxetine  30 mg Oral QHS  . enoxaparin (LOVENOX) injection  40  mg Subcutaneous Q24H  . feeding supplement (PRO-STAT SUGAR FREE 64)  30 mL Oral TID WC  . folic acid  1 mg Oral Daily  . lamoTRIgine  200 mg Oral BID  . multivitamin  5 mL Oral Daily  . mupirocin ointment  1 application Nasal BID  . nutrition supplement (JUVEN)  1 packet Oral BID BM  . oxybutynin  5 mg Oral Daily  . pantoprazole  40 mg Oral Custom  . potassium chloride  10 mEq Oral Daily  . pregabalin  75 mg Oral BID  . QUEtiapine  50 mg Oral QHS  . senna-docusate  1 tablet Oral Daily   Continuous Infusions: . sodium chloride 50 mL/hr at 08/31/13 [place], Kamyiah Colantonio  [facility] Pager [phone]. If 8PM-8AM, please contact night-coverage at [email or web address], password [facility] 09/01/2013, 7:51 AM  LOS: 4 days

## 2013-09-01 NOTE — Transfer of Care (Signed)
Immediate Anesthesia Transfer of Care Note  Patient: Samuel [name]  Procedure(s) Performed: Procedure(s) with comments: INTRAMEDULLARY (IM) NAIL INTERTROCHANTRIC (Right) - RIGHT HIP FRACTURE FIXATION (IMHS)  Patient Location: PACU  Anesthesia Type:General  Level of Consciousness: awake, alert  and patient cooperative  Airway & Oxygen Therapy: Patient Spontanous Breathing  Post-op Assessment: Report given to PACU RN and Post -op Vital signs reviewed and stable  Post vital signs: Reviewed  Complications: No apparent anesthesia complications

## 2013-09-01 NOTE — Progress Notes (Signed)
Orthopedic Tech Progress Note Patient Details:  Samuel [name] 1956/04/17 [id number]  Patient ID: Joziyah Roblero, male   DOB: [birth year], 58 y.o.   MRN: [id number] Trapeze bar patient helper  Hildred Priest 09/01/2013, 7:34 PM

## 2013-09-01 NOTE — Preoperative (Signed)
Beta Blockers   Reason not to administer Beta Blockers:Not Applicable 

## 2013-09-01 NOTE — Progress Notes (Signed)
Attempted to contact family regarding patient transfer to stepdown. Unable to reach anyone.

## 2013-09-01 NOTE — Progress Notes (Signed)
Pain with PT yesterday localizing to r hip Plan hip fracture fixation today  Risks benefits discussed

## 2013-09-01 NOTE — Anesthesia Postprocedure Evaluation (Signed)
  Anesthesia Post-op Note  Patient: Samuel [name]  Procedure(s) Performed: Procedure(s) with comments: INTRAMEDULLARY (IM) NAIL INTERTROCHANTRIC (Right) - RIGHT HIP FRACTURE FIXATION (IMHS)  Patient Location: PACU  Anesthesia Type:General  Level of Consciousness: awake  Airway and Oxygen Therapy: Patient Spontanous Breathing  Post-op Pain: mild  Post-op Assessment: Post-op Vital signs reviewed  Post-op Vital Signs: Reviewed  Complications: No apparent anesthesia complications

## 2013-09-01 NOTE — Progress Notes (Signed)
UR completed Samuel Frew [name], RN, BSN, [place], CCM  09/01/2013 12:25 PM

## 2013-09-01 NOTE — Progress Notes (Signed)
Pt was NPO since midnight and advised, this morning he was insisting to drink water, explained that he can't have and the risk and offered mouth swab, non compliant and even yelled at me for the water. Report given to the incoming nurse.

## 2013-09-01 NOTE — Brief Op Note (Signed)
08/28/2013 - 09/01/2013  8:00 PM  PATIENT:  Samuel [name]  58 y.o. male  PRE-OPERATIVE DIAGNOSIS:  RIGHT HIP FRACTURE  POST-OPERATIVE DIAGNOSIS:  Right hip fracture  PROCEDURE:  Procedure(s): INTRAMEDULLARY (IM) NAIL INTERTROCHANTRIC  SURGEON:  Surgeon(s): [name], MD  ASSISTANT: carla bethune rnfa  ANESTHESIA:   general  EBL: 1500 ml    Total I/O In: 2025 [I.V.:1200; Blood:325; IV Piggyback:500] Out: 1500 [Blood:1500]  BLOOD ADMINISTERED: 3 u prbc  DRAINS: none   LOCAL MEDICATIONS USED:  none  SPECIMEN:  No Specimen  COUNTS:  YES  TOURNIQUET:  * No tourniquets in log *  DICTATION: .Other Dictation: Dictation Number [id number]  PLAN OF CARE: Admit to inpatient   PATIENT DISPOSITION:  PACU - hemodynamically stable

## 2013-09-01 NOTE — Progress Notes (Signed)
1 PRBC transfused with no reaction noted. Will continue to monitor

## 2013-09-01 NOTE — Anesthesia Procedure Notes (Signed)
Procedure Name: Intubation Date/Time: 09/01/2013 5:19 PM Performed by: Maeola Harman Pre-anesthesia Checklist: Patient identified, Emergency Drugs available, Suction available, Patient being monitored and Timeout performed Patient Re-evaluated:Patient Re-evaluated prior to inductionOxygen Delivery Method: Circle system utilized Preoxygenation: Pre-oxygenation with 100% oxygen Intubation Type: IV induction Ventilation: Mask ventilation without difficulty and Oral airway inserted - appropriate to patient size Laryngoscope Size: Mac and 4 Grade View: Grade I Tube type: Oral Tube size: 7.5 mm Number of attempts: 1 Airway Equipment and Method: Stylet Placement Confirmation: ETT inserted through vocal cords under direct vision,  positive ETCO2 and breath sounds checked- equal and bilateral Secured at: 22 cm Tube secured with: Tape Dental Injury: Teeth and Oropharynx as per pre-operative assessment  Comments: Easy atraumatic induction and intubation with MAC 4 blade.  Dr. Albertina Parr verified placement of ETT.  Waldron Session, CRNA

## 2013-09-01 NOTE — Anesthesia Preprocedure Evaluation (Addendum)
Anesthesia Evaluation  Patient identified by MRN, date of birth, ID band Patient awake    Reviewed: Allergy & Precautions, H&P , NPO status , Patient's Chart, lab work & pertinent test results, reviewed documented beta blocker date and time   Airway Mallampati: II TM Distance: >3 FB Neck ROM: full    Dental  (+) Missing and Poor Dentition,    Pulmonary neg pulmonary ROS,  breath sounds clear to auscultation        Cardiovascular hypertension, On Medications Rhythm:regular     Neuro/Psych Seizures -, Well Controlled,  PSYCHIATRIC DISORDERS Bipolar Disorder L-1 parapelgia  Neuromuscular disease CVA, Residual Symptoms    GI/Hepatic GERD-  Medicated and Controlled,(+) Hepatitis -, C  Endo/Other  negative endocrine ROS  Renal/GU Renal InsufficiencyRenal disease  negative genitourinary   Musculoskeletal   Abdominal (+)  Abdomen: soft. Bowel sounds: normal.  Peds  Hematology  (+) anemia ,   Anesthesia Other Findings See surgeon's H&P .  Old trach noted.  Waldron Session, CRNA  Reproductive/Obstetrics negative OB ROS                         Anesthesia Physical Anesthesia Plan  ASA: III  Anesthesia Plan: General   Post-op Pain Management:    Induction: Intravenous  Airway Management Planned: Oral ETT  Additional Equipment:   Intra-op Plan:   Post-operative Plan: Extubation in OR  Informed Consent: I have reviewed the patients History and Physical, chart, labs and discussed the procedure including the risks, benefits and alternatives for the proposed anesthesia with the patient or authorized representative who has indicated his/her understanding and acceptance.   Dental Advisory Given  Plan Discussed with: CRNA and Surgeon  Anesthesia Plan Comments:         Anesthesia Quick Evaluation

## 2013-09-02 DIAGNOSIS — D62 Acute posthemorrhagic anemia: Secondary | ICD-10-CM

## 2013-09-02 DIAGNOSIS — A419 Sepsis, unspecified organism: Secondary | ICD-10-CM

## 2013-09-02 LAB — CBC
HCT: 19.7 % — ABNORMAL LOW (ref 39.0–52.0)
Hemoglobin: 6.8 g/dL — CL (ref 13.0–17.0)
MCH: 27.9 pg (ref 26.0–34.0)
MCHC: 34.5 g/dL (ref 30.0–36.0)
MCV: 80.7 fL (ref 78.0–100.0)
Platelets: 165 10*3/uL (ref 150–400)
RBC: 2.44 MIL/uL — AB (ref 4.22–5.81)
RDW: 16 % — ABNORMAL HIGH (ref 11.5–15.5)
WBC: 7 10*3/uL (ref 4.0–10.5)

## 2013-09-02 LAB — BASIC METABOLIC PANEL
BUN: 35 mg/dL — ABNORMAL HIGH (ref 6–23)
BUN: 9 mg/dL (ref 6–23)
CALCIUM: 7.2 mg/dL — AB (ref 8.4–10.5)
CO2: 23 mEq/L (ref 19–32)
CO2: 22 mEq/L (ref 19–32)
CREATININE: 0.58 mg/dL (ref 0.50–1.35)
Calcium: 8.1 mg/dL — ABNORMAL LOW (ref 8.4–10.5)
Chloride: 100 mEq/L (ref 96–112)
Chloride: 105 mEq/L (ref 96–112)
Creatinine, Ser: 1.08 mg/dL (ref 0.50–1.35)
GFR calc non Af Amer: 75 mL/min — ABNORMAL LOW (ref >90)
GFR, EST AFRICAN AMERICAN: 87 mL/min — AB (ref >90)
Glucose, Bld: 166 mg/dL — ABNORMAL HIGH (ref 70–99)
Glucose, Bld: 126 mg/dL — ABNORMAL HIGH (ref 70–99)
POTASSIUM: 4 meq/L (ref 3.7–5.3)
POTASSIUM: 3.4 meq/L — AB (ref 3.7–5.3)
Sodium: 135 mEq/L — ABNORMAL LOW (ref 137–147)
Sodium: 141 mEq/L (ref 137–147)

## 2013-09-02 LAB — PREPARE RBC (CROSSMATCH)

## 2013-09-02 LAB — BLOOD PRODUCT ORDER (VERBAL) VERIFICATION

## 2013-09-02 LAB — PROTIME-INR
INR: 1.32 (ref 0.00–1.49)
PROTHROMBIN TIME: 16.1 s — AB (ref 11.6–15.2)

## 2013-09-02 MED ORDER — RISAQUAD PO CAPS
1.0000 | ORAL_CAPSULE | Freq: Every day | ORAL | Status: DC
  Administered 2013-09-02 – 2013-09-03 (×2): 1 via ORAL
  Filled 2013-09-02 (×6): qty 1

## 2013-09-02 MED ORDER — PHYTONADIONE 5 MG PO TABS
5.0000 mg | ORAL_TABLET | Freq: Once | ORAL | Status: AC
  Administered 2013-09-02: 5 mg via ORAL
  Filled 2013-09-02: qty 1

## 2013-09-02 NOTE — Progress Notes (Signed)
PT Cancellation Note  Patient Details Name: Samuel [name] MRN: [id number] DOB: 05-01-1956   Cancelled Treatment:    Reason Eval/Treat Not Completed: Medical issues which prohibited therapy;Patient not medically ready. Pt with Hgb  6.8, planned for transfusion. Will re-attempt to see when pt is medically ready and Hgb is within therapeutic range.    Elie Confer [place], [place] 09/02/2013, 3:16 PM

## 2013-09-02 NOTE — Progress Notes (Signed)
Patient has been transferred to unit 3S; report given to Liz Beach, [facility] for further follow up.  No change in d/c plan at this time- will return to SNF at [facility] when medically stale per MD.  Lorie Phenix. [place], [place]

## 2013-09-02 NOTE — Progress Notes (Signed)
Pt stable - awake Leg with swelling right - compts ok Labs pending - recd ffp last pm Coagulopathy source still unclear

## 2013-09-02 NOTE — Op Note (Signed)
NAME:  CLOVIS, MANKINS NO.:  [id number]  MEDICAL RECORD NO.:  [id number]  LOCATION:  3S12C                        FACILITY:  [facility]  PHYSICIAN:  Anderson Malta, M.D.    DATE OF BIRTH:  1956/03/25  DATE OF PROCEDURE: DATE OF DISCHARGE:                              OPERATIVE REPORT   PREOPERATIVE DIAGNOSIS:  Right hip intertrochanteric/subtroch femur fracture.  POSTOPERATIVE DIAGNOSIS:  Right hip intertrochanteric/subtroch femur fracture.  PROCEDURE:  Right hip IMHS with Biomet nail, 32 mm with compression screw and derotation screw proximally and 1 distal interlock.  SURGEON:  Anderson Malta, M.D.  ASSISTANT:  Angela Nevin Bassoon, RNFA  ESTIMATED BLOOD LOSS:  5900 mL.  INDICATIONS:  Cipriano Millikan is a 58 year old patient with right hip fracture.  He is paraplegic and does have sensation in the hip and groin region.  The initial plan was for nonoperative treatment; however, with physical therapy and mobilization, he was pretty painful and the decision was made to proceed with operative intervention.  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  He did have a successful knee surgery approximately a year ago.  The patient has developed and becoming essentially nonambulatory, although he does stand and pivot at times for transfers.  PROCEDURE IN DETAIL:  The patient was brought to operating room, where general anesthesia was introduced.  Preop antibiotics were administered. Time-out was called.  The patient was placed on the fracture table.  The right leg was placed under traction.  The patient did have some severe deformity of the proximal femoral head.  The fracture extended and involved the intertrochanteric and subtrochanteric region.  The patient was placed under traction.  Reasonable fracture reduction was achievable.  Large soft tissue envelope the proximal  thigh.  The area was then prepped with DuraPrep and Ioban drape was utilized.  An 8-cm incision was made, handbreadth and a half proximal to the tip of the trochanter.  Skin, subcu, tissue sharply divided.  Very deep within the soft tissue sleeve, the fascia was divided.  The bleeding points encountered and controlled with electrocautery; however, electrocautery was somewhat ineffective that coagulating the bleeding vessels even when applied directly to the bleeding vessels.  Guide pin was then placed in accordance with preoperative templating, 32 mm nail was chosen. Initially, the [place] and Nephew nail was going to be used, the shortest nail was 34.  This will require a 32 mm nail because of the knee hardware present.  No reaming of the shaft was required, once proximal reaming had been performed, the nail was placed.  Two proximal screws were placed.  One compression, one derotational screw.  Traction was released and the screws were placed in a slight sliding fashion.  The 1 distal interlocking screw was placed through the distal-most interlocking hole.  This hole was chosen because he is nonweightbearing and it was the larger longitudinal hole to use.  At this time, the 2 distal interlocking incisions were irrigated and closed using 2-0 Vicryl and skin staples.  The proximal incision was irrigated and closed after applying some Gelfoam.  This who was closed in multiple layers  using #1 Vicryl suture, 0 Vicryl suture, 2-0 Vicryl suture and skin staples.  The patient did receive multiple units of packed red blood cells and albumin.  The intraoperative hematocrit 29.  The patient otherwise tolerated procedure, but will need to be step down observation.  Angela Nevin Bassoon's assistance was required during the case for retraction and closure.     Anderson Malta, M.D.     GSD/MEDQ  D:  09/01/2013  T:  09/02/2013  Job:  [id number]

## 2013-09-02 NOTE — Clinical Social Work Psychosocial (Addendum)
    Clinical Social Work Department BRIEF PSYCHOSOCIAL ASSESSMENT 09/02/2013  Patient:  Samuel [name], Samuel [name]     Account Number:  [id number]     Admit date:  08/28/2013  Clinical Social Worker:  Iona Coach  Date/Time:  08/30/2013 01:45 PM  Referred by:  Physician  Date Referred:  08/29/2013 Referred for  Other - See comment   Other Referral:   Return to [facility] type:   Other interview type:    PSYCHOSOCIAL DATA Living Status:  FACILITY Admitted from facility:  [facility] Level of care:  [place] Primary support name:  [name]  [phone] Primary support relationship to patient:  CHILD, ADULT Degree of support available:   Strong support    CURRENT CONCERNS Current Concerns  Post-Acute Placement   Other Concerns:    SOCIAL WORK ASSESSMENT / PLAN 58 year old male- resident of [facility]. Patient admitted with multiple medical issues. Plan is for patient to return to SNF when medically stable. Patient agrees to return to facility. CSW spoke to admissions at [facility]- then will accept patient back whem medically stable.  Fl2 placed on chart for MD"s signature.   Assessment/plan status:  Psychosocial Support/Ongoing Assessment of Needs Other assessment/ plan:   Information/referral to community resources:   None at this time    PATIENT'S/FAMILY'S RESPONSE TO PLAN OF CARE: Pateint is alert and oriented; states he will return to facility when medically ready; he denies any current CSW concerns or needs. Patient was reassured that facility wants him to return when medically stable.  He agreed to have his daughters contacted if needed re: d/c. CSW will monitor and assist with d/c whem stable per MD.  Butch Penny T. [place], [place]

## 2013-09-02 NOTE — Progress Notes (Signed)
CRITICAL VALUE ALERT  Critical value received:  Hemoglobin 6.8  Date of notification:  09/02/2013  Time of notification:  1010  Critical value read back:yes  Nurse who received alert:  Wonda Cerise  MD notified (1st page):  Aileen Fass  Time of first page:  1045  MD notified (2nd page):  Time of second page:  Responding MD:    Time MD responded:

## 2013-09-02 NOTE — Progress Notes (Signed)
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 Pt describes pain as constant and throbbing, non radiating, 10/10 in severity, worse with movement and with no specific alleviating factors. Pt explains he has not had any prior specific symptoms such as dizziness, no chest pain or shortness of breath. Pt denies fevers, chills, no specific urinary concerns other than dysuria.   Assessment/Plan: Right hip fracture after falling from wheelchair: - Pt cannot transfer to wheelchair due to significant pain. Will d/w Dr. Marlou Sa. - Hip CT showed 2.4.2015: Large right adductor hematoma , upper thigh with an underlying small  hematoma - s/p 2.5.2015 intramedullary nail intertrochanteric. - CBC pending. Check PT/INR, plt's wnl.  ? UTI - Will continue empiric Rocephin urine culture showed proteus mirabalis. - Exchange foley catheter.  Anemia of chronic disease/acute blood loss anemia: - S/p 3 UNits of PRBC, last Hbg >9.0, cont to monitor closely. - cbc post surgical procedure pending. - PT/INR pending.  History of PSA  - Alcohol, tobacco, narcotics  - pt denies any illicit substance abuse and reports he quit smoking and alcohol use 5 years ago  - Will be cautious with pain meds.  Hypokalemia  - Repleted   Leukocytosis  - Secondary to demargination from hip fracture and UTI  - ABX Rocephin,  - Improving, UC showed > 100K proteus Mirabilis.  Decubitus ulcer  - Dry gauze packed into the wound, will switch to silver hydrofiber for recalcitrant wound. Change every other day starting tom.   Protein calorie malnutrition (severe) and hypoalbuminemia:  - Continue prostat   Code Status: Full  Family Communication: no family at bedside  Disposition Plan: back to SNF when medically  stable.    Consultants:  Ortho  Procedures:  CT  Antibiotics:  Rocephin  HPI/Subjective: Pain controlled.  Objective: Filed Vitals:   09/02/13 0340 09/02/13 0435 09/02/13 0552 09/02/13 0610  BP: 111/67 113/63 108/63 112/63  Pulse: 105 102 101 103  Temp: 99.3 F (37.4 C) 98.6 F (37 C) 99.1 F (37.3 C) 99.3 F (37.4 C)  TempSrc: Oral Oral Oral Oral  Resp: 10 30 24 20   Height:      Weight:      SpO2: 97% 95% 95% 93%    Intake/Output Summary (Last 24 hours) at 09/02/13 0713 Last data filed at 09/02/13 0555  Gross per 24 hour  Intake 4180.5 ml  Output   3265 ml  Net  915.5 ml   Filed Weights   08/31/13 0536 09/01/13 0703 09/01/13 2212  Weight: 109.317 kg (241 lb) 112 kg (246 lb 14.6 oz) 110.3 kg (243 lb 2.7 oz)    Exam:  General: Alert, awake, oriented x3, in no acute distress.  Heart: Regular rate and rhythm, without murmurs, rubs, gallops.  Lungs: Good air movement, CTA B/L. Abdomen: Soft, nontender, nondistended, positive bowel sounds.     Data Reviewed: Basic Metabolic Panel:  Recent Labs Lab 08/28/13 2220 08/30/13 0440 08/31/13 0424 09/01/13 1925 09/02/13 0245  NA 141 140 140 138 135*  K 3.5* 4.2 3.5* 3.5* 4.0  CL 103 105 106  --  100  CO2  --  23 24  --  23  GLUCOSE 120* 135* 117*  --  166*  BUN 7 8 12   --  35*  CREATININE 0.80 0.70 0.86  --  1.08  CALCIUM  --  8.2* 7.9*  --  8.1*   Liver Function Tests:  Recent Labs Lab 08/30/13 0440  AST 19  ALT 15  ALKPHOS 78  BILITOT 0.5  PROT 6.3  ALBUMIN 2.9*   No results found for this basename: LIPASE, AMYLASE,  in the last 168 hours No results found for this basename: AMMONIA,  in the last 168 hours CBC:  Recent Labs Lab 08/28/13 2211  08/31/13 0424 08/31/13 1130 09/01/13 0425 09/01/13 1925 09/01/13 2025  WBC 16.2*  --  10.9* 10.1 9.7  --  24.4*  NEUTROABS 12.9*  --   --   --   --   --   --   HGB 12.7*  < > 7.8* 7.9* 7.8* 9.9* 9.7*  HCT 37.9*  < > 24.2* 24.4* 24.0* 29.0*  28.6*  MCV 79.1  --  80.9 80.3 80.5  --  81.9  PLT 267  --  163 177 200  --  200  < > = values in this interval not displayed. Cardiac Enzymes: No results found for this basename: CKTOTAL, CKMB, CKMBINDEX, TROPONINI,  in the last 168 hours BNP (last 3 results) No results found for this basename: PROBNP,  in the last 8760 hours CBG: No results found for this basename: GLUCAP,  in the last 168 hours  Recent Results (from the past 240 hour(s))  URINE CULTURE     Status: None   Collection Time    08/28/13  9:56 PM      Result Value Range Status   Specimen Description URINE, CATHETERIZED   Final   Special Requests CX ADDED AT 2227 ON [id number]   Final   Culture  Setup Time     Final   Value: 08/28/2013 23:08     Performed at [facility]     Final   Value: >=100,000 COLONIES/ML     Performed at [facility]   Culture     Final   Value: PROTEUS MIRABILIS     Performed at [facility]   Report Status 08/31/2013 FINAL   Final   Organism ID, Bacteria PROTEUS MIRABILIS   Final  CULTURE, BLOOD (ROUTINE X 2)     Status: None   Collection Time    08/29/13  1:13 AM      Result Value Range Status   Specimen Description BLOOD RIGHT ANTECUBITAL   Final   Special Requests BOTTLES DRAWN AEROBIC AND ANAEROBIC 5CC EA   Final   Culture  Setup Time     Final   Value: 08/29/2013 08:47     Performed at [facility]   Culture     Final   Value:        BLOOD CULTURE RECEIVED NO GROWTH TO DATE CULTURE WILL BE HELD FOR 5 DAYS BEFORE ISSUING A FINAL NEGATIVE REPORT     Performed at [facility]   Report Status PENDING   Incomplete  SURGICAL PCR SCREEN     Status: Abnormal   Collection Time    08/29/13  4:35 AM      Result Value Range Status   MRSA, 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   Staphylococcus aureus POSITIVE (*) NEGATIVE Final   Comment:             The Xpert  SA Assay (FDA     approved for NASAL specimens     in patients over 61 years of age),     is one component of     a comprehensive surveillance     program.  Test performance has     been validated by [employer] for patients greater     than or equal to 56 year old.     It is not intended     to diagnose infection nor to     guide or monitor treatment.     RESULT CALLED TO, READ BACK BY AND VERIFIED WITH:     Marcy Siren (RN) [phone] 08/29/2013 [facility]  URINE CULTURE     Status: None   Collection Time    08/30/13  4:58 AM      Result Value Range Status   Specimen Description URINE, CATHETERIZED   Final   Special Requests NONE   Final   Culture  Setup Time     Final   Value: 08/30/2013 05:28     Performed at [facility]     Final   Value: >=100,000 COLONIES/ML     Performed at [facility]   Culture     Final   Value: Multiple bacterial morphotypes present, none predominant. Suggest appropriate recollection if clinically indicated.     Performed at [facility]   Report Status 08/31/2013 FINAL   Final     Studies: Dg Hip Operative Right  2013/09/17   CLINICAL DATA:  58 year old male undergoing repair of right hip fracture. Initial encounter.  EXAM: DG OPERATIVE RIGHT HIP  TECHNIQUE: A single spot fluoroscopic AP image of the right hip is submitted.  COMPARISON:  Preoperative film 08/28/2013.  FLUOROSCOPY TIME:  4 min 30 seconds.  FINDINGS: Four intraoperative coned-down fluoroscopic views of the right femur. Abnormal proximal right femur from the head to the intertrochanteric region with comminuted fracture again noted. Fragments traversed by intra medullary rod with proximal dual interlocking dynamic hip screws. Distal interlocking cortical screw placed.  IMPRESSION: ORIF right proximal femur.   Electronically Signed   By: Lars Pinks M.D.   On: 09-17-13 19:59   Dg Pelvis Portable  2013/09/17   CLINICAL DATA:  Status post  fracture fixation.  EXAM: PORTABLE PELVIS 1-2 VIEWS  COMPARISON:  Plain films right femur 08/28/2013.  FINDINGS: The patient has a new dynamic hip screw and long intramedullary nail with a single distal interlocking screw for fixation of an inter and subtrochanteric fracture. A second screw is in place across the fracture. No new fracture is identified. Hardware is intact.  IMPRESSION: ORIF right inter and subtrochanteric fracture as described. No acute abnormality   Electronically Signed   By: Inge Rise M.D.   On: Sep 17, 2013 21:38   Ct Hip Right Wo Contrast  08/31/2013   CLINICAL DATA:  Right hip pain  EXAM: CT OF THE RIGHT HIP WITHOUT CONTRAST  TECHNIQUE: Multidetector CT imaging was performed according to the standard protocol. Multiplanar CT image reconstructions were also generated.  COMPARISON:  DG FEMUR*R* dated 08/28/2013  FINDINGS: There is a comminuted right intertrochanteric fracture with mild displacement. 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 There is a large joint effusion.  There is a large hematoma measuring approximately 9.1 x 3.1 cm in greatest axial dimension and 7.6  cm in greatest craniocaudal dimension. There is a skin defect involving the posterior medial upper thigh with a small hematoma within the subcutaneous fat adjacent to the skin defect with the hematoma measuring approximately 2.8 x 5.2 cm. There is generalized subcutaneous edema along the anterior upper thigh.  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  There are mild degenerative changes of the right SI joint.  There is a drainage catheter within the subcutaneous fat of the right lower abdomen with a 2.4 x 4.6 cm high attenuation area surrounding the catheter. There is no fluid present within this area.  IMPRESSION: 1. Comminuted right intertrochanteric fracture with mild displacement. No dislocation.  2. Large right adductor hematoma measuring 9.1 x 3.1 x 7.6 cm. There is a skin defect involving the posterior medial upper thigh with an underlying small 2.8 x 5.2 cm hematoma. 3. Drainage catheter within the subcutaneous fat of the right lower abdomen with a 2.4 x 4.6 cm high attenuation area surrounding the catheter which may represent blood products.  4. Large right hip joint effusion which may be reactive. If there is concern regarding infection, recommend arthrocentesis.   Electronically Signed   By: Kathreen Devoid   On: 08/31/2013 15:23    Scheduled Meds: . amLODipine  5 mg Oral QHS  . atorvastatin  10 mg Oral QHS  . baclofen  20 mg Oral TID  . cefTRIAXone (ROCEPHIN)  IV  1 g Intravenous Once  . cefTRIAXone (ROCEPHIN)  IV  1 g Intravenous Q24H  . Chlorhexidine Gluconate Cloth  6 each Topical Q0600  . DULoxetine  30 mg Oral QHS  . feeding supplement (PRO-STAT SUGAR FREE 64)  30 mL Oral TID WC  . folic acid  1 mg Oral Daily  . HYDROmorphone      . lamoTRIgine  200 mg Oral BID  . multivitamin  5 mL Oral Daily  . mupirocin ointment  1 application Nasal BID  . nutrition supplement (JUVEN)  1 packet Oral BID BM  . oxybutynin  5 mg Oral Daily  . pantoprazole  40 mg Oral Custom  . potassium chloride  10 mEq Oral Daily  . pregabalin  75 mg Oral BID  . QUEtiapine  50 mg Oral QHS  . senna-docusate  1 tablet Oral Daily   Continuous Infusions: . sodium chloride 50 mL/hr at 08/31/13 1900  . 0.9 % NaCl with KCl 20 mEq / L 75 mL/hr at 09/02/13 [place], [place] [facility] Pager [phone]. If 8PM-8AM, please contact night-coverage at [email or web address], password [facility] 09/02/2013, 7:13 AM  LOS: 5 days

## 2013-09-03 LAB — PREPARE FRESH FROZEN PLASMA
Unit division: 0
Unit division: 0
Unit division: 0

## 2013-09-03 LAB — BASIC METABOLIC PANEL
BUN: 7 mg/dL (ref 6–23)
CALCIUM: 7.5 mg/dL — AB (ref 8.4–10.5)
CO2: 24 mEq/L (ref 19–32)
CREATININE: 0.6 mg/dL (ref 0.50–1.35)
Chloride: 106 mEq/L (ref 96–112)
GFR calc Af Amer: >90 mL/min (ref >90)
Glucose, Bld: 118 mg/dL — ABNORMAL HIGH (ref 70–99)
Potassium: 3.4 mEq/L — ABNORMAL LOW (ref 3.7–5.3)
Sodium: 141 mEq/L (ref 137–147)

## 2013-09-03 LAB — CBC
HCT: 24.5 % — ABNORMAL LOW (ref 39.0–52.0)
Hemoglobin: 8.3 g/dL — ABNORMAL LOW (ref 13.0–17.0)
MCH: 28 pg (ref 26.0–34.0)
MCHC: 33.9 g/dL (ref 30.0–36.0)
MCV: 82.8 fL (ref 78.0–100.0)
PLATELETS: 170 10*3/uL (ref 150–400)
RBC: 2.96 MIL/uL — ABNORMAL LOW (ref 4.22–5.81)
RDW: 15.9 % — ABNORMAL HIGH (ref 11.5–15.5)
WBC: 9.5 10*3/uL (ref 4.0–10.5)

## 2013-09-03 LAB — HEPATIC FUNCTION PANEL
ALBUMIN: 2.4 g/dL — AB (ref 3.5–5.2)
ALK PHOS: 60 U/L (ref 39–117)
ALT: 11 U/L (ref 0–53)
AST: 16 U/L (ref 0–37)
BILIRUBIN DIRECT: 0.3 mg/dL (ref 0.0–0.3)
BILIRUBIN TOTAL: 1.1 mg/dL (ref 0.3–1.2)
Indirect Bilirubin: 0.8 mg/dL (ref 0.3–0.9)
Total Protein: 5.3 g/dL — ABNORMAL LOW (ref 6.0–8.3)

## 2013-09-03 LAB — PROTIME-INR
INR: 1.15 (ref 0.00–1.49)
Prothrombin Time: 14.5 seconds (ref 11.6–15.2)

## 2013-09-03 MED ORDER — AMOXICILLIN-POT CLAVULANATE 875-125 MG PO TABS
1.0000 | ORAL_TABLET | Freq: Two times a day (BID) | ORAL | Status: DC
  Administered 2013-09-03 – 2013-09-04 (×3): 1 via ORAL
  Filled 2013-09-03 (×6): qty 1

## 2013-09-03 MED ORDER — POTASSIUM CHLORIDE CRYS ER 20 MEQ PO TBCR
40.0000 meq | EXTENDED_RELEASE_TABLET | Freq: Two times a day (BID) | ORAL | Status: AC
  Administered 2013-09-03 (×2): 40 meq via ORAL
  Filled 2013-09-03 (×2): qty 2

## 2013-09-03 NOTE — Progress Notes (Signed)
Pt stable - incisions ok Mobilize nwb rle Placement pending hgb coags stable

## 2013-09-03 NOTE — Progress Notes (Signed)
Pt tx to 5N11, belongs packed ans sent to new room.  VSS-elink and CMT notified of tx and pt removed from monitor.

## 2013-09-03 NOTE — Progress Notes (Signed)
[facility] PROGRESS NOTE Interim History: 58 yo male with history of left AKA, wheelchair bound, presented to [facility] ED with main concern of sudden onset right hip pain that occurred shortly after he has fell out from his wheelchair. Pt describes pain as constant and throbbing, non radiating, 10/10 in severity, worse with movement and with no specific alleviating factors. Pt explains he has not had any prior specific symptoms such as dizziness, no chest pain or shortness of breath. Pt denies fevers, chills, no specific urinary concerns other than dysuria.   Assessment/Plan: Right hip fracture after falling from wheelchair: - Hip CT showed 2.4.2015: Large right adductor hematoma , upper thigh with an underlying small  hematoma - s/p 2.5.2015 intramedullary nail intertrochanteric. - Hbg 9.7  -> 6.8 2units of PRBC transfuse 8.3. - transfer to floor.   ? UTI - Rocephin urine culture showed proteus mirabilis and Augmentin Change abx to augmentin. - Cath exchange on 2.5.2015.  Anemia of chronic disease/acute blood loss anemia: - S/p 3 UNits of PRBC, last Hbg >8.0, cont to monitor closely. - PT/INR WNL.  History of PSA  - Alcohol, tobacco, narcotics  - pt denies any illicit substance abuse and reports he quit smoking and alcohol use 5 years ago  - Will be cautious with pain meds.  Hypokalemia  - Repleted   Leukocytosis  - Secondary to demargination from hip fracture and UTI. Resolved. - Improving, UC showed > 100K proteus Mirabilis.  Decubitus ulcer  - Dry gauze packed into the wound, will switch to silver hydrofiber for recalcitrant wound. Change every other day starting tom.   Protein calorie malnutrition (severe) and hypoalbuminemia:  - Continue prostat   Code Status: Full  Family Communication: no family at bedside  Disposition Plan: back to SNF when medically stable.    Consultants:  Ortho  Procedures:  CT  Antibiotics:  Rocephin  HPI/Subjective: Pain  controlled after surgery  Objective: Filed Vitals:   09/02/13 2353 09/03/13 0000 09/03/13 0400 09/03/13 0617  BP: 102/57   112/57  Pulse: 102   83  Temp: 97.9 F (36.6 C)   98.4 F (36.9 C)  TempSrc: Oral   Oral  Resp: 23 25 24 27   Height:      Weight:      SpO2: 96% 92% 94% 96%    Intake/Output Summary (Last 24 hours) at 09/03/13 0743 Last data filed at 09/03/13 0539  Gross per 24 hour  Intake   1765 ml  Output   4050 ml  Net  -2285 ml   Filed Weights   08/31/13 0536 09/01/13 0703 09/01/13 2212  Weight: 109.317 kg (241 lb) 112 kg (246 lb 14.6 oz) 110.3 kg (243 lb 2.7 oz)    Exam:  General: Alert, awake, oriented x3, in no acute distress.  Heart: Regular rate and rhythm, without murmurs, rubs, gallops.  Lungs: Good air movement, CTA B/L. Abdomen: Soft, nontender, nondistended, positive bowel sounds.     Data Reviewed: Basic Metabolic Panel:  Recent Labs Lab 08/30/13 0440 08/31/13 0424 09/01/13 1925 09/02/13 0245 09/02/13 0900 09/03/13 0604  NA 140 140 138 135* 141 141  K 4.2 3.5* 3.5* 4.0 3.4* 3.4*  CL 105 106  --  100 105 106  CO2 23 24  --  23 22 24   GLUCOSE 135* 117*  --  166* 126* 118*  BUN 8 12  --  35* 9 7  CREATININE 0.70 0.86  --  1.08 0.58 0.60  CALCIUM 8.2* 7.9*  --  8.1* 7.2* 7.5*   Liver Function Tests:  Recent Labs Lab 08/30/13 0440  AST 19  ALT 15  ALKPHOS 78  BILITOT 0.5  PROT 6.3  ALBUMIN 2.9*   No results found for this basename: LIPASE, AMYLASE,  in the last 168 hours No results found for this basename: AMMONIA,  in the last 168 hours CBC:  Recent Labs Lab 08/28/13 2211  08/31/13 1130 09/01/13 0425 09/01/13 1925 09/01/13 2025 09/02/13 0900 09/03/13 0604  WBC 16.2*  < > 10.1 9.7  --  24.4* 7.0 9.5  NEUTROABS 12.9*  --   --   --   --   --   --   --   HGB 12.7*  < > 7.9* 7.8* 9.9* 9.7* 6.8* 8.3*  HCT 37.9*  < > 24.4* 24.0* 29.0* 28.6* 19.7* 24.5*  MCV 79.1  < > 80.3 80.5  --  81.9 80.7 82.8  PLT 267  < > 177 200   --  200 165 170  < > = values in this interval not displayed. Cardiac Enzymes: No results found for this basename: CKTOTAL, CKMB, CKMBINDEX, TROPONINI,  in the last 168 hours BNP (last 3 results) No results found for this basename: PROBNP,  in the last 8760 hours CBG: No results found for this basename: GLUCAP,  in the last 168 hours  Recent Results (from the past 240 hour(s))  URINE CULTURE     Status: None   Collection Time    08/28/13  9:56 PM      Result Value Range Status   Specimen Description URINE, CATHETERIZED   Final   Special Requests CX ADDED AT 2227 ON [id number]   Final   Culture  Setup Time     Final   Value: 08/28/2013 23:08     Performed at [place]     Final   Value: >=100,000 COLONIES/ML     Performed at [facility]   Culture     Final   Value: PROTEUS MIRABILIS     Performed at [facility]   Report Status 08/31/2013 FINAL   Final   Organism ID, Bacteria PROTEUS MIRABILIS   Final  CULTURE, BLOOD (ROUTINE X 2)     Status: None   Collection Time    08/29/13  1:13 AM      Result Value Range Status   Specimen Description BLOOD RIGHT ANTECUBITAL   Final   Special Requests BOTTLES DRAWN AEROBIC AND ANAEROBIC 5CC EA   Final   Culture  Setup Time     Final   Value: 08/29/2013 08:47     Performed at [facility]   Culture     Final   Value:        BLOOD CULTURE RECEIVED NO GROWTH TO DATE CULTURE WILL BE HELD FOR 5 DAYS BEFORE ISSUING A FINAL NEGATIVE REPORT     Performed at [facility]   Report Status PENDING   Incomplete  SURGICAL PCR SCREEN     Status: Abnormal   Collection Time    08/29/13  4:35 AM      Result Value Range Status   MRSA, PCR POSITIVE (*) NEGATIVE Final   Comment: RESULT CALLED TO, READ BACK BY AND VERIFIED WITH:     Marcy Siren (RN) [id number] 08/29/2013 L. [facility]   Staphylococcus aureus POSITIVE (*) NEGATIVE Final   Comment:            The Xpert  SA Assay (FDA     approved for NASAL  specimens     in patients over 36 years of age),     is one component of     a comprehensive surveillance     program.  Test performance has     been validated by [employer] for patients greater     than or equal to 68 year old.     It is not intended     to diagnose infection nor to     guide or monitor treatment.     RESULT CALLED TO, READ BACK BY AND VERIFIED WITH:     Marcy Siren (RN) [phone] 08/29/2013 L. [facility]  URINE CULTURE     Status: None   Collection Time    08/30/13  4:58 AM      Result Value Range Status   Specimen Description URINE, CATHETERIZED   Final   Special Requests NONE   Final   Culture  Setup Time     Final   Value: 08/30/2013 05:28     Performed at [place]     Final   Value: >=100,000 COLONIES/ML     Performed at [facility]   Culture     Final   Value: Multiple bacterial morphotypes present, none predominant. Suggest appropriate recollection if clinically indicated.     Performed at [facility]   Report Status 08/31/2013 FINAL   Final     Studies: Dg Hip Operative Right  09/19/13   CLINICAL DATA:  58 year old male undergoing repair of right hip fracture. Initial encounter.  EXAM: DG OPERATIVE RIGHT HIP  TECHNIQUE: A single spot fluoroscopic AP image of the right hip is submitted.  COMPARISON:  Preoperative film 08/28/2013.  FLUOROSCOPY TIME:  4 min 30 seconds.  FINDINGS: Four intraoperative coned-down fluoroscopic views of the right femur. Abnormal proximal right femur from the head to the intertrochanteric region with comminuted fracture again noted. Fragments traversed by intra medullary rod with proximal dual interlocking dynamic hip screws. Distal interlocking cortical screw placed.  IMPRESSION: ORIF right proximal femur.   Electronically Signed   By: Lars Pinks M.D.   On: 09-19-2013 19:59   Dg Pelvis Portable  2013-09-19   CLINICAL DATA:  Status post fracture fixation.  EXAM: PORTABLE PELVIS 1-2 VIEWS   COMPARISON:  Plain films right femur 08/28/2013.  FINDINGS: The patient has a new dynamic hip screw and long intramedullary nail with a single distal interlocking screw for fixation of an inter and subtrochanteric fracture. A second screw is in place across the fracture. No new fracture is identified. Hardware is intact.  IMPRESSION: ORIF right inter and subtrochanteric fracture as described. No acute abnormality   Electronically Signed   By: Inge Rise M.D.   On: 09/19/2013 21:38    Scheduled Meds: . acidophilus  1 capsule Oral Daily  . amLODipine  5 mg Oral QHS  . atorvastatin  10 mg Oral QHS  . baclofen  20 mg Oral TID  . cefTRIAXone (ROCEPHIN)  IV  1 g Intravenous Once  . cefTRIAXone (ROCEPHIN)  IV  1 g Intravenous Q24H  . DULoxetine  30 mg Oral QHS  . feeding supplement (PRO-STAT SUGAR FREE 64)  30 mL Oral TID WC  . folic acid  1 mg Oral Daily  . lamoTRIgine  200 mg Oral BID  . multivitamin  5 mL Oral Daily  . mupirocin ointment  1 application Nasal BID  . nutrition supplement (JUVEN)  1 packet Oral BID BM  . oxybutynin  5 mg Oral Daily  . pantoprazole  40 mg Oral Custom  . potassium chloride  10 mEq Oral Daily  . potassium chloride  40 mEq Oral BID WC  . pregabalin  75 mg Oral BID  . QUEtiapine  50 mg Oral QHS  . senna-docusate  1 tablet Oral Daily   Continuous Infusions: . sodium chloride 50 mL/hr at 08/31/13 1900  . 0.9 % NaCl with KCl 20 mEq / L Stopped (09/02/13 0100)     Charlynne Cousins  [facility] Pager [phone]. If 8PM-8AM, please contact night-coverage at [email or web address], password [facility] 09/03/2013, 7:43 AM  LOS: 6 days

## 2013-09-04 LAB — TYPE AND SCREEN
ABO/RH(D): O POS
Antibody Screen: NEGATIVE
UNIT DIVISION: 0
UNIT DIVISION: 0
Unit division: 0
Unit division: 0
Unit division: 0
Unit division: 0
Unit division: 0
Unit division: 0
Unit division: 0

## 2013-09-04 LAB — CBC
HCT: 24.2 % — ABNORMAL LOW (ref 39.0–52.0)
Hemoglobin: 8.1 g/dL — ABNORMAL LOW (ref 13.0–17.0)
MCH: 28.2 pg (ref 26.0–34.0)
MCHC: 33.5 g/dL (ref 30.0–36.0)
MCV: 84.3 fL (ref 78.0–100.0)
Platelets: 194 10*3/uL (ref 150–400)
RBC: 2.87 MIL/uL — ABNORMAL LOW (ref 4.22–5.81)
RDW: 16.4 % — AB (ref 11.5–15.5)
WBC: 10.2 10*3/uL (ref 4.0–10.5)

## 2013-09-04 LAB — CULTURE, BLOOD (ROUTINE X 2): CULTURE: NO GROWTH

## 2013-09-04 LAB — PROTIME-INR
INR: 1.16 (ref 0.00–1.49)
PROTHROMBIN TIME: 14.6 s (ref 11.6–15.2)

## 2013-09-04 MED ORDER — POLYETHYLENE GLYCOL 3350 17 G PO PACK
17.0000 g | PACK | Freq: Every day | ORAL | Status: DC
  Filled 2013-09-04 (×2): qty 1

## 2013-09-04 MED ORDER — AMOXICILLIN-POT CLAVULANATE 875-125 MG PO TABS
1.0000 | ORAL_TABLET | Freq: Two times a day (BID) | ORAL | Status: DC
  Administered 2013-09-04 – 2013-09-05 (×2): 1 via ORAL
  Filled 2013-09-04 (×3): qty 1

## 2013-09-04 MED ORDER — HYDROCODONE-ACETAMINOPHEN 5-325 MG PO TABS: 1.0000 | ORAL_TABLET | Freq: Four times a day (QID) | ORAL | Status: DC | PRN

## 2013-09-04 NOTE — Progress Notes (Signed)
[facility] PROGRESS NOTE Interim History: 58 yo male with history of left AKA, wheelchair bound, presented to [facility] ED with main concern of sudden onset right hip pain that occurred shortly after he has fell out from his wheelchair. Pt describes pain as constant and throbbing, non radiating, 10/10 in severity, worse with movement and with no specific alleviating factors. Pt explains he has not had any prior specific symptoms such as dizziness, no chest pain or shortness of breath. Pt denies fevers, chills, no specific urinary concerns other than dysuria.   Assessment/Plan: Right hip fracture after falling from wheelchair: - Hip CT showed 2.4.2015: Large right adductor hematoma , upper thigh with an underlying small  hematoma - s/p 2.5.2015 intramedullary nail intertrochanteric. Pain controlled. - Hbg 9.7  -> 6.8 2units of PRBC transfuse. Hbg stable. - SNF in am.  ? UTI - Rocephin urine culture showed proteus mirabilis and Augmentin Change abx to augmentin. - Cath exchange on 2.5.2015.  Anemia of chronic disease/acute blood loss anemia: - S/p 3 UNits of PRBC, last Hbg >8.0, cont to monitor closely. - PT/INR WNL.  History of PSA  - Alcohol, tobacco, narcotics  - pt denies any illicit substance abuse and reports he quit smoking and alcohol use 5 years ago  - Will be cautious with pain meds.  Hypokalemia  - Repleted   Leukocytosis  - Secondary to demargination from hip fracture and UTI. Resolved. - Improving, UC showed > 100K proteus Mirabilis.  Decubitus ulcer  - Dry gauze packed into the wound, will switch to silver hydrofiber for recalcitrant wound. Change every other day starting tom.   Protein calorie malnutrition (severe) and hypoalbuminemia:  - Continue prostat   Code Status: Full  Family Communication: no family at bedside  Disposition Plan: back to SNF when medically  stable.    Consultants:  Ortho  Procedures:  CT  Antibiotics:  Rocephin  HPI/Subjective: Pain controlled.  Objective: Filed Vitals:   09/03/13 0800 09/03/13 1524 09/03/13 1937 09/04/13 0501  BP: 132/72 156/94 127/56 106/55  Pulse:  106 105 77  Temp: 98.7 F (37.1 C) 98.3 F (36.8 C) 98.1 F (36.7 C) 98.7 F (37.1 C)  TempSrc: Oral Oral Oral Oral  Resp: 23 18 18 22   Height:      Weight:      SpO2: 92% 96% 90% 92%    Intake/Output Summary (Last 24 hours) at 09/04/13 1308 Last data filed at 09/04/13 0507  Gross per 24 hour  Intake    640 ml  Output   1650 ml  Net  -1010 ml   Filed Weights   08/31/13 0536 09/01/13 0703 09/01/13 2212  Weight: 109.317 kg (241 lb) 112 kg (246 lb 14.6 oz) 110.3 kg (243 lb 2.7 oz)    Exam:  General: Alert, awake, oriented x3, in no acute distress.  Heart: Regular rate and rhythm, without murmurs, rubs, gallops.  Lungs: Good air movement, CTA B/L. Abdomen: Soft, nontender, nondistended, positive bowel sounds.     Data Reviewed: Basic Metabolic Panel:  Recent Labs Lab 08/30/13 0440 08/31/13 0424 09/01/13 1925 09/02/13 0245 09/02/13 0900 09/03/13 0604  NA 140 140 138 135* 141 141  K 4.2 3.5* 3.5* 4.0 3.4* 3.4*  CL 105 106  --  100 105 106  CO2 23 24  --  23 22 24   GLUCOSE 135* 117*  --  166* 126* 118*  BUN 8 12  --  35* 9 7  CREATININE 0.70 0.86  --  1.08  0.58 0.60  CALCIUM 8.2* 7.9*  --  8.1* 7.2* 7.5*   Liver Function Tests:  Recent Labs Lab 08/30/13 0440 09/03/13 0604  AST 19 16  ALT 15 11  ALKPHOS 78 60  BILITOT 0.5 1.1  PROT 6.3 5.3*  ALBUMIN 2.9* 2.4*   No results found for this basename: LIPASE, AMYLASE,  in the last 168 hours No results found for this basename: AMMONIA,  in the last 168 hours CBC:  Recent Labs Lab 08/28/13 2211  09/01/13 0425 09/01/13 1925 09/01/13 2025 09/02/13 0900 09/03/13 0604 09/04/13 0512  WBC 16.2*  < > 9.7  --  24.4* 7.0 9.5 10.2  NEUTROABS 12.9*  --   --   --    --   --   --   --   HGB 12.7*  < > 7.8* 9.9* 9.7* 6.8* 8.3* 8.1*  HCT 37.9*  < > 24.0* 29.0* 28.6* 19.7* 24.5* 24.2*  MCV 79.1  < > 80.5  --  81.9 80.7 82.8 84.3  PLT 267  < > 200  --  200 165 170 194  < > = values in this interval not displayed. Cardiac Enzymes: No results found for this basename: CKTOTAL, CKMB, CKMBINDEX, TROPONINI,  in the last 168 hours BNP (last 3 results) No results found for this basename: PROBNP,  in the last 8760 hours CBG: No results found for this basename: GLUCAP,  in the last 168 hours  Recent Results (from the past 240 hour(s))  URINE CULTURE     Status: None   Collection Time    08/28/13  9:56 PM      Result Value Range Status   Specimen Description URINE, CATHETERIZED   Final   Special Requests CX ADDED AT 2227 ON [id number]   Final   Culture  Setup Time     Final   Value: 08/28/2013 23:08     Performed at [place]     Final   Value: >=100,000 COLONIES/ML     Performed at [facility]   Culture     Final   Value: PROTEUS MIRABILIS     Performed at [facility]   Report Status 08/31/2013 FINAL   Final   Organism ID, Bacteria PROTEUS MIRABILIS   Final  CULTURE, BLOOD (ROUTINE X 2)     Status: None   Collection Time    08/29/13  1:13 AM      Result Value Range Status   Specimen Description BLOOD RIGHT ANTECUBITAL   Final   Special Requests BOTTLES DRAWN AEROBIC AND ANAEROBIC 5CC EA   Final   Culture  Setup Time     Final   Value: 08/29/2013 08:47     Performed at [facility]   Culture     Final   Value:        BLOOD CULTURE RECEIVED NO GROWTH TO DATE CULTURE WILL BE HELD FOR 5 DAYS BEFORE ISSUING A FINAL NEGATIVE REPORT     Performed at [facility]   Report Status PENDING   Incomplete  SURGICAL PCR SCREEN     Status: Abnormal   Collection Time    08/29/13  4:35 AM      Result Value Range Status   MRSA, PCR POSITIVE (*) NEGATIVE Final   Comment: RESULT CALLED TO, READ BACK BY AND  VERIFIED WITH:     Marcy Siren (RN) [phone] 08/29/2013 L. [facility]   Staphylococcus aureus POSITIVE (*)  NEGATIVE Final   Comment:            The Xpert SA Assay (FDA     approved for NASAL specimens     in patients over 70 years of age),     is one component of     a comprehensive surveillance     program.  Test performance has     been validated by [employer] for patients greater     than or equal to 8 year old.     It is not intended     to diagnose infection nor to     guide or monitor treatment.     RESULT CALLED TO, READ BACK BY AND VERIFIED WITH:     Marcy Siren (RN) [phone] 08/29/2013 L. [facility]  URINE CULTURE     Status: None   Collection Time    08/30/13  4:58 AM      Result Value Range Status   Specimen Description URINE, CATHETERIZED   Final   Special Requests NONE   Final   Culture  Setup Time     Final   Value: 08/30/2013 05:28     Performed at [place]     Final   Value: >=100,000 COLONIES/ML     Performed at [facility]   Culture     Final   Value: Multiple bacterial morphotypes present, none predominant. Suggest appropriate recollection if clinically indicated.     Performed at [facility]   Report Status 08/31/2013 FINAL   Final     Studies: No results found.  Scheduled Meds: . acidophilus  1 capsule Oral Daily  . amLODipine  5 mg Oral QHS  . amoxicillin-clavulanate  1 tablet Oral Q12H  . atorvastatin  10 mg Oral QHS  . baclofen  20 mg Oral TID  . cefTRIAXone (ROCEPHIN)  IV  1 g Intravenous Once  . DULoxetine  30 mg Oral QHS  . feeding supplement (PRO-STAT SUGAR FREE 64)  30 mL Oral TID WC  . folic acid  1 mg Oral Daily  . lamoTRIgine  200 mg Oral BID  . multivitamin  5 mL Oral Daily  . nutrition supplement (JUVEN)  1 packet Oral BID BM  . oxybutynin  5 mg Oral Daily  . pantoprazole  40 mg Oral Custom  . potassium chloride  10 mEq Oral Daily  . pregabalin  75 mg Oral BID  . QUEtiapine  50 mg Oral QHS  .  senna-docusate  1 tablet Oral Daily   Continuous Infusions: . sodium chloride 50 mL/hr at 08/31/13 [place], Nikyah Lackman  [facility] Pager [phone]. If 8PM-8AM, please contact night-coverage at [email or web address], password [facility] 09/04/2013, 1:08 PM  LOS: 7 days

## 2013-09-04 NOTE — Progress Notes (Addendum)
Pt stable - can mobilize better Thigh has post op swelling Incisions ok Placement pending Reg diet  In terms of dvt prophylaxis i would favor none because his activity level has not changed pre and post surgery and his coagulopathic issue still unexplained

## 2013-09-04 NOTE — Progress Notes (Signed)
Clinical Education officer, museum (CSW) sent updated clinicals to [place] via [facility]. D/C plan remains that patient will return to [facility] when medically stable.   Blima Rich, [place] Weekend CSW [phone]

## 2013-09-05 ENCOUNTER — Encounter (HOSPITAL_BASED_OUTPATIENT_CLINIC_OR_DEPARTMENT_OTHER): Payer: Medicare Other

## 2013-09-05 ENCOUNTER — Encounter (HOSPITAL_COMMUNITY): Payer: Self-pay | Admitting: Orthopedic Surgery

## 2013-09-05 LAB — COMPREHENSIVE METABOLIC PANEL
ALT: 11 U/L (ref 0–53)
AST: 13 U/L (ref 0–37)
Albumin: 2.5 g/dL — ABNORMAL LOW (ref 3.5–5.2)
Alkaline Phosphatase: 77 U/L (ref 39–117)
BUN: 6 mg/dL (ref 6–23)
CALCIUM: 7.7 mg/dL — AB (ref 8.4–10.5)
CO2: 24 mEq/L (ref 19–32)
CREATININE: 0.59 mg/dL (ref 0.50–1.35)
Chloride: 106 mEq/L (ref 96–112)
GFR calc Af Amer: >90 mL/min (ref >90)
Glucose, Bld: 116 mg/dL — ABNORMAL HIGH (ref 70–99)
Potassium: 3.4 mEq/L — ABNORMAL LOW (ref 3.7–5.3)
Sodium: 141 mEq/L (ref 137–147)
TOTAL PROTEIN: 5.6 g/dL — AB (ref 6.0–8.3)
Total Bilirubin: 1 mg/dL (ref 0.3–1.2)

## 2013-09-05 LAB — CBC
HCT: 25.4 % — ABNORMAL LOW (ref 39.0–52.0)
Hemoglobin: 8.4 g/dL — ABNORMAL LOW (ref 13.0–17.0)
MCH: 28.1 pg (ref 26.0–34.0)
MCHC: 33.1 g/dL (ref 30.0–36.0)
MCV: 84.9 fL (ref 78.0–100.0)
Platelets: 237 10*3/uL (ref 150–400)
RBC: 2.99 MIL/uL — ABNORMAL LOW (ref 4.22–5.81)
RDW: 17.3 % — AB (ref 11.5–15.5)
WBC: 9.1 10*3/uL (ref 4.0–10.5)

## 2013-09-05 MED ORDER — FENTANYL 75 MCG/HR TD PT72: 75.0000 ug | MEDICATED_PATCH | TRANSDERMAL | Status: DC

## 2013-09-05 MED ORDER — MECLIZINE HCL 25 MG PO TABS: 25.0000 mg | ORAL_TABLET | Freq: Three times a day (TID) | ORAL | Status: DC | PRN

## 2013-09-05 MED ORDER — FERROUS SULFATE 325 (65 FE) MG PO TABS: 325.0000 mg | ORAL_TABLET | Freq: Every day | ORAL | Status: DC

## 2013-09-05 MED ORDER — AMOXICILLIN-POT CLAVULANATE 875-125 MG PO TABS: 1.0000 | ORAL_TABLET | Freq: Two times a day (BID) | ORAL | Status: DC

## 2013-09-05 MED ORDER — OXYCODONE HCL 15 MG PO TABS: 15.0000 mg | ORAL_TABLET | Freq: Three times a day (TID) | ORAL | Status: DC | PRN

## 2013-09-05 MED ORDER — MECLIZINE HCL 25 MG PO TABS
25.0000 mg | ORAL_TABLET | Freq: Three times a day (TID) | ORAL | Status: DC | PRN
  Filled 2013-09-05: qty 1

## 2013-09-05 NOTE — Progress Notes (Signed)
Patient being discharged back to [facility] at this time. He is being transported by non-emergent ambulance.

## 2013-09-05 NOTE — Progress Notes (Signed)
OT Cancellation Note and defer back to SNF  Patient Details Name: Samuel [name] MRN: [id number] DOB: [birth year]   Cancelled Treatment:    Reason Eval/Treat Not Completed: Other (comment). Pt is from SNF and will be D/C'ing back there today, will defer OT eval to that facility as they deem appropriate. Acute OT will sign off.  Almon Register [phone] 09/05/2013, 11:40 AM
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
## 2013-09-05 NOTE — Progress Notes (Signed)
Pt has been calling the nursing staff frequently throughout the shift for minor things. Insisted the tech stay in the room while he brushed his teeth. Is able to move himself independently in the bed, but he insists on having someone at bedside when he moves. He has not complained of much pain and has slept  in short intervals after taking pain meds.

## 2013-09-05 NOTE — Discharge Summary (Signed)
Physician Discharge Summary  Lynne Righi NOB:[id number] DOB: 02-01-1956 DOA: 08/28/2013  PCP: [name], MD  Admit date: 08/28/2013 Discharge date: 09/05/2013  Time spent: 31 minutes minutes  Recommendations for Outpatient Follow-up:  1. Follow up with ortho as an outpatient  Discharge Diagnoses:  Active Problems:   Hip fracture   Acute blood loss anemia   Discharge Condition: stable  Diet recommendation: carb modified  Filed Weights   08/31/13 0536 09/01/13 0703 09/01/13 2212  Weight: 109.317 kg (241 lb) 112 kg (246 lb 14.6 oz) 110.3 kg (243 lb 2.7 oz)    History of present illness:  58 yo male with history of left AKA, wheelchair bound, presented to [facility] ED with main concern of sudden onset right hip pain that occurred shortly after he has fell out from his wheelchair. Pt describes pain as constant and throbbing, non radiating, 10/10 in severity, worse with movement and with no specific alleviating factors. Pt explains he has not had any prior specific symptoms such as dizziness, no chest pain or shortness of breath. Pt denies fevers, chills, no specific urinary concerns other than dysuria.      Hospital Course:  Right hip fracture after falling from wheelchair:  - Hip CT showed 2.4.2015: Large right adductor hematoma , upper thigh with an underlying small hematoma  - s/p 2.5.2015 intramedullary nail intertrochanteric. Pain controlled.  - Hbg 9.7 -> 6.8 2units of PRBC transfuse. Hbg stable.  - follow up with Dr. Marlou Sa.  UTI - Rocephin urine culture showed proteus mirabilis and Augmentin Change abx to augmentin.  - Cath exchange on 2.5.2015.  - has remained afebirle cont abx for 3 more days.  Anemia of chronic disease/acute blood loss anemia:  - S/p 3 UNits of PRBC, last Hbg >8.0, cont to monitor closely.  - PT/INR WNL.  - Ferous sulfate.  History of PSA  - Alcohol, tobacco, narcotics  - pt denies any illicit substance abuse and reports he quit smoking and  alcohol use 5 years ago  - Will be cautious with pain meds.   Hypokalemia  - Repleted   Leukocytosis  - Secondary to demargination from hip fracture and UTI. Resolved.  - Improving, UC showed > 100K proteus Mirabilis.   Decubitus ulcer  - Dry gauze packed into the wound, will switch to silver hydrofiber for recalcitrant wound. Change every other day starting tom.   Protein calorie malnutrition (severe) and hypoalbuminemia:  - Continue prostat   Procedures:  Ct hip right  X-ray of hip right  Consultations:  orthopedica  Discharge Exam: Filed Vitals:   09/04/13 2336  BP:   Pulse:   Temp:   Resp: 17    General: A&O x3 Cardiovascular: RRR Respiratory: good air movement CTA B/L  Discharge Instructions      Discharge Orders   Future Appointments Provider Department Dept Phone   09/19/2013 1:00 PM [place] [phone]   Future Orders Complete By Expires   Diet - low sodium heart healthy  As directed    Increase activity slowly  As directed    Non weight bearing  As directed    Questions:     Laterality:     Extremity:         Medication List         acetaminophen 325 MG tablet  Commonly known as:  TYLENOL  Take 650 mg by mouth every 6 (six) hours as needed (pain). Do not exceed 4 gms  of Tylenol in 24 hours     alum & mag hydroxide-simeth 200-200-20 MG/5ML suspension  Commonly known as:  MAALOX/MYLANTA  Take 20 mLs by mouth 2 (two) times daily as needed for heartburn.     amLODipine 5 MG tablet  Commonly known as:  NORVASC  Take 1 tablet (5 mg total) by mouth at bedtime.     amoxicillin-clavulanate 875-125 MG per tablet  Commonly known as:  AUGMENTIN  Take 1 tablet by mouth every 12 (twelve) hours.     atorvastatin 10 MG tablet  Commonly known as:  LIPITOR  Take 10 mg by mouth at bedtime.     baclofen 20 MG tablet  Commonly known as:  LIORESAL  Take 20 mg by mouth 3 (three) times daily.  9am, 2pm, 9pm     clonazePAM 1 MG tablet  Commonly known as:  KLONOPIN  Take 1 mg by mouth 3 (three) times daily. 9am, 5pm, 9pm     clonazePAM 0.5 MG tablet  Commonly known as:  KLONOPIN  Take 0.5 mg by mouth every 8 (eight) hours as needed for anxiety.     DULoxetine 30 MG capsule  Commonly known as:  CYMBALTA  Take 30 mg by mouth at bedtime.     fentaNYL 75 MCG/HR  Commonly known as:  DURAGESIC - dosed mcg/hr  Place 1 patch (75 mcg total) onto the skin every 3 (three) days. *Remove old Patch*Rotate Site*     ferrous sulfate 325 (65 FE) MG tablet  Take 1 tablet (325 mg total) by mouth daily with breakfast.     folic acid 1 MG tablet  Commonly known as:  FOLVITE  Take 1 mg by mouth daily.     GENERLAC 10 GM/15ML Soln  Generic drug:  lactulose (encephalopathy)  Take 20 g by mouth 4 (four) times daily. 9am, 1pm, 5pm, 9pm     lamoTRIgine 200 MG tablet  Commonly known as:  LAMICTAL  Take 200 mg by mouth 2 (two) times daily. To stabilize bipolar mood     meclizine 25 MG tablet  Commonly known as:  ANTIVERT  Take 1 tablet (25 mg total) by mouth 3 (three) times daily as needed for dizziness.     Melatonin 3 MG Caps  Take 3 mg by mouth at bedtime.     nitroGLYCERIN 0.4 MG SL tablet  Commonly known as:  NITROSTAT  Place 0.4 mg under the tongue every 5 (five) minutes as needed for chest pain. x3 doses as needed for chest pain     oxybutynin 5 MG tablet  Commonly known as:  DITROPAN  Take 5 mg by mouth daily.     oxyCODONE 15 MG immediate release tablet  Commonly known as:  ROXICODONE  Take 1 tablet (15 mg total) by mouth 3 (three) times daily as needed (moderate to severe pain).     pantoprazole 40 MG tablet  Commonly known as:  PROTONIX  Take 40 mg by mouth 2 (two) times daily. 9am and 5pm     potassium chloride 10 MEQ CR capsule  Commonly known as:  MICRO-K  Take 10 mEq by mouth daily.     pregabalin 75 MG capsule  Commonly known as:  LYRICA  Take 1 capsule (75  mg total) by mouth 2 (two) times daily.     QUEtiapine 50 MG tablet  Commonly known as:  SEROQUEL  Take 50 mg by mouth at bedtime.     senna-docusate 8.6-50 MG per tablet  Commonly  known as:  Senokot-S  Take 1 tablet by mouth daily.     STENDRA 100 MG Tabs  Generic drug:  Avanafil  Take 100 mg by mouth daily as needed (edema). Do not take with nitroglycerin or other nitrates - must be separate by a 24 hour period     SYSTANE OP  Place 2 drops into both eyes 3 (three) times daily. 8am, 2pm, 8pm     vitamin B-12 1000 MCG tablet  Commonly known as:  CYANOCOBALAMIN  Take 1,000 mcg by mouth daily.     Vitamin D3 50000 UNITS Caps  Take 50,000 Units by mouth every 30 (thirty) days. On the 11th of each month       No Known Allergies Follow-up Information   Follow up with [name], MD In 2 weeks.   Specialty:  Orthopedic Surgery   Contact information:   [place] [id number] [phone]        The results of significant diagnostics from this hospitalization (including imaging, microbiology, ancillary and laboratory) are listed below for reference.    Significant Diagnostic Studies: Dg Chest 1 View  08/28/2013   CLINICAL DATA:  Fall.  EXAM: CHEST - 1 VIEW  COMPARISON:  Single view of the chest 03/01/2013.  FINDINGS: There is unchanged elevation of the right hemidiaphragm. Lungs appear clear. Heart size is upper normal. No pneumothorax or pleural effusion. Surgical clips in the left upper quadrant the abdomen are noted.  IMPRESSION: No acute finding.   Electronically Signed   By: Inge Rise M.D.   On: 08/28/2013 23:19   Dg Pelvis 1-2 Views  08/28/2013   CLINICAL DATA:  Fall.  Pain.  EXAM: PELVIS - 1-2 VIEW  COMPARISON:  None.  FINDINGS: There is partial visualization of a right intertrochanteric fracture. No other acute bony or joint abnormality is identified. Osteolysis of the femoral heads is worse on the left. There is destructive change of the  left inferior pubic ramus with an appearance most compatible with chronic osteomyelitis.  IMPRESSION: Partial visualization of a right intertrochanteric fracture.  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   Electronically Signed   By: Inge Rise M.D.   On: 08/28/2013 23:18   Dg Hip Operative Right  09/01/2013   CLINICAL DATA:  58 year old male undergoing repair of right hip fracture. Initial encounter.  EXAM: DG OPERATIVE RIGHT HIP  TECHNIQUE: A single spot fluoroscopic AP image of the right hip is submitted.  COMPARISON:  Preoperative film 08/28/2013.  FLUOROSCOPY TIME:  4 min 30 seconds.  FINDINGS: Four intraoperative coned-down fluoroscopic views of the right femur. Abnormal proximal right femur from the head to the intertrochanteric region with comminuted fracture again noted. Fragments traversed by intra medullary rod with proximal dual interlocking dynamic hip screws. Distal interlocking cortical screw placed.  IMPRESSION: ORIF right proximal femur.   Electronically Signed   By: Lars Pinks M.D.   On: 09/01/2013 19:59   Dg Femur Right  08/28/2013   CLINICAL DATA:  Right leg pain in a paraplegic patient.  EXAM: RIGHT FEMUR - 2 VIEW  COMPARISON:  None.  FINDINGS: The patient has an intertrochanteric right hip fracture with subtrochanteric extension. There is osteolysis of the right femoral head with remodeling of the right acetabulum consistent with chronic change. Bones appear osteopenic. There is fixation of a remote healed distal femoral fracture. Advanced for age degenerative disease about the right knee  is partially visualized.  IMPRESSION: Right intertrochanteric fracture with subtrochanteric extension.  Chronic appearing osteolysis and remodeling of the right hip and acetabulum.  Remote healed distal right femur fracture with fixation hardware in place.   Electronically Signed   By: Inge Rise M.D.   On:  08/28/2013 23:15   Dg Tibia/fibula Right  08/28/2013   CLINICAL DATA:  Status post fall.  Right lower leg pain.  EXAM: RIGHT TIBIA AND FIBULA - 2 VIEW  COMPARISON:  MRI right knee 12/31/2012.  FINDINGS: The patient has remote healed fractures of the distal right femur, proximal right tibia and fibula and distal right tibia and fibula. No acute bony or joint abnormality is identified. Bones are markedly osteopenic.  IMPRESSION: No acute finding.  Multiple remote fractures as above.   Electronically Signed   By: Inge Rise M.D.   On: 08/28/2013 23:16   Dg Pelvis Portable  09/01/2013   CLINICAL DATA:  Status post fracture fixation.  EXAM: PORTABLE PELVIS 1-2 VIEWS  COMPARISON:  Plain films right femur 08/28/2013.  FINDINGS: The patient has a new dynamic hip screw and long intramedullary nail with a single distal interlocking screw for fixation of an inter and subtrochanteric fracture. A second screw is in place across the fracture. No new fracture is identified. Hardware is intact.  IMPRESSION: ORIF right inter and subtrochanteric fracture as described. No acute abnormality   Electronically Signed   By: Inge Rise M.D.   On: 09/01/2013 21:38   Ct Hip Right Wo Contrast  08/31/2013   CLINICAL DATA:  Right hip pain  EXAM: CT OF THE RIGHT HIP WITHOUT CONTRAST  TECHNIQUE: Multidetector CT imaging was performed according to the standard protocol. Multiplanar CT image reconstructions were also generated.  COMPARISON:  DG FEMUR*R* dated 08/28/2013  FINDINGS: There is a comminuted right intertrochanteric fracture with mild displacement. 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 There is a large joint effusion.  There is a large hematoma measuring approximately 9.1 x 3.1 cm in greatest axial dimension and 7.6 cm in greatest craniocaudal dimension. There is a skin defect involving the posterior  medial upper thigh with a small hematoma within the subcutaneous fat adjacent to the skin defect with the hematoma measuring approximately 2.8 x 5.2 cm. There is generalized subcutaneous edema along the anterior upper thigh.  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  There are mild degenerative changes of the right SI joint.  There is a drainage catheter within the subcutaneous fat of the right lower abdomen with a 2.4 x 4.6 cm high attenuation area surrounding the catheter. There is no fluid present within this area.  IMPRESSION: 1. Comminuted right intertrochanteric fracture with mild displacement. No dislocation. 2. Large right adductor hematoma measuring 9.1 x 3.1 x 7.6 cm. There is a skin defect involving the posterior medial upper thigh with an underlying small 2.8 x 5.2 cm hematoma. 3. Drainage catheter within the subcutaneous fat of the right lower abdomen with a 2.4 x 4.6 cm high attenuation area surrounding the catheter which may represent blood products.  4. Large right hip joint effusion which may be reactive. If there is concern regarding infection, recommend arthrocentesis.   Electronically Signed   By: Kathreen Devoid   On: 08/31/2013 15:23   Dg Chest Portable 1 View  08/29/2013   CLINICAL DATA:  Fall with hip and knee pain.  EXAM: PORTABLE  CHEST - 1 VIEW  COMPARISON:  Chest x-ray from yesterday.  FINDINGS: Chronic elevation of the right diaphragm. Widening of the lower mediastinum which may represent hiatal hernia given surgical changes in the region. Chronic cardiomegaly. No edema, effusion, or pneumothorax.  IMPRESSION: Stable exam.  No edema or definite pneumonia.   Electronically Signed   By: Jorje Guild M.D.   On: 08/29/2013 06:30   Dg Shoulder Left  08/28/2013   CLINICAL DATA:  Fall.  Right shoulder pain.  EXAM: LEFT SHOULDER - 2+ VIEW  COMPARISON:  None.  FINDINGS: No acute bony or joint abnormality is identified. Degenerative disease about the right shoulder is seen. Image  right lung and ribs are unremarkable.  IMPRESSION: No acute finding.   Electronically Signed   By: Inge Rise M.D.   On: 08/28/2013 23:17    Microbiology: Recent Results (from the past 240 hour(s))  URINE CULTURE     Status: None   Collection Time    08/28/13  9:56 PM      Result Value Range Status   Specimen Description URINE, CATHETERIZED   Final   Special Requests CX ADDED AT 2227 ON [id number]   Final   Culture  Setup Time     Final   Value: 08/28/2013 23:08     Performed at [place]     Final   Value: >=100,000 COLONIES/ML     Performed at [facility]   Culture     Final   Value: PROTEUS MIRABILIS     Performed at [facility]   Report Status 08/31/2013 FINAL   Final   Organism ID, Bacteria PROTEUS MIRABILIS   Final  CULTURE, BLOOD (ROUTINE X 2)     Status: None   Collection Time    08/29/13  1:13 AM      Result Value Range Status   Specimen Description BLOOD RIGHT ANTECUBITAL   Final   Special Requests BOTTLES DRAWN AEROBIC AND ANAEROBIC 5CC EA   Final   Culture  Setup Time     Final   Value: 08/29/2013 08:47     Performed at [facility]   Culture     Final   Value: NO GROWTH 5 DAYS     Performed at [facility]   Report Status 09/04/2013 FINAL   Final  SURGICAL PCR SCREEN     Status: Abnormal   Collection Time    08/29/13  4:35 AM      Result Value Range Status   MRSA, 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   Staphylococcus aureus POSITIVE (*) NEGATIVE Final   Comment:            The Xpert SA Assay (FDA     approved for NASAL specimens     in patients over 46 years of age),     is one component of     a comprehensive surveillance     program.  Test performance has     been validated by [employer] for patients greater     than or equal to 26 year old.     It is not intended     to diagnose  infection nor to     guide or monitor treatment.     RESULT CALLED TO, READ  BACK BY AND VERIFIED WITH:     Marcy Siren (RN) [phone] 08/29/2013 [facility]  URINE CULTURE     Status: None   Collection Time    08/30/13  4:58 AM      Result Value Range Status   Specimen Description URINE, CATHETERIZED   Final   Special Requests NONE   Final   Culture  Setup Time     Final   Value: 08/30/2013 05:28     Performed at [name]     Final   Value: >=100,000 COLONIES/ML     Performed at [facility]   Culture     Final   Value: Multiple bacterial morphotypes present, none predominant. Suggest appropriate recollection if clinically indicated.     Performed at [facility]   Report Status 08/31/2013 FINAL   Final     Labs: Basic Metabolic Panel:  Recent Labs Lab 08/31/13 0424 09/01/13 1925 09/02/13 0245 09/02/13 0900 09/03/13 0604 09/05/13 0448  NA 140 138 135* 141 141 141  K 3.5* 3.5* 4.0 3.4* 3.4* 3.4*  CL 106  --  100 105 106 106  CO2 24  --  23 22 24 24   GLUCOSE 117*  --  166* 126* 118* 116*  BUN 12  --  35* 9 7 6   CREATININE 0.86  --  1.08 0.58 0.60 0.59  CALCIUM 7.9*  --  8.1* 7.2* 7.5* 7.7*   Liver Function Tests:  Recent Labs Lab 08/30/13 0440 09/03/13 0604 09/05/13 0448  AST 19 16 13   ALT 15 11 11   ALKPHOS 78 60 77  BILITOT 0.5 1.1 1.0  PROT 6.3 5.3* 5.6*  ALBUMIN 2.9* 2.4* 2.5*   No results found for this basename: LIPASE, AMYLASE,  in the last 168 hours No results found for this basename: AMMONIA,  in the last 168 hours CBC:  Recent Labs Lab 09/01/13 2025 09/02/13 0900 09/03/13 0604 09/04/13 0512 09/05/13 0448  WBC 24.4* 7.0 9.5 10.2 9.1  HGB 9.7* 6.8* 8.3* 8.1* 8.4*  HCT 28.6* 19.7* 24.5* 24.2* 25.4*  MCV 81.9 80.7 82.8 84.3 84.9  PLT 200 165 170 194 237   Cardiac Enzymes: No results found for this basename: CKTOTAL, CKMB, CKMBINDEX, TROPONINI,  in the last 168 hours BNP: BNP (last 3 results) No results  found for this basename: PROBNP,  in the last 8760 hours CBG: No results found for this basename: GLUCAP,  in the last 168 hours     Signed:  Charlynne Cousins  [facility] 09/05/2013, 11:27 AM

## 2013-09-05 NOTE — Plan of Care (Signed)
Problem: Phase III Progression Outcomes Goal: Ambulate BID with assist as able Outcome: Not Met (add Reason) bedbound at baseline

## 2013-09-06 ENCOUNTER — Non-Acute Institutional Stay (SKILLED_NURSING_FACILITY): Payer: PRIVATE HEALTH INSURANCE | Admitting: Internal Medicine

## 2013-09-06 DIAGNOSIS — L8993 Pressure ulcer of unspecified site, stage 3: Secondary | ICD-10-CM

## 2013-09-06 DIAGNOSIS — D62 Acute posthemorrhagic anemia: Secondary | ICD-10-CM

## 2013-09-06 DIAGNOSIS — S72141A Displaced intertrochanteric fracture of right femur, initial encounter for closed fracture: Secondary | ICD-10-CM

## 2013-09-06 DIAGNOSIS — S72143A Displaced intertrochanteric fracture of unspecified femur, initial encounter for closed fracture: Secondary | ICD-10-CM

## 2013-09-06 DIAGNOSIS — L8990 Pressure ulcer of unspecified site, unspecified stage: Secondary | ICD-10-CM
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
## 2013-09-12 ENCOUNTER — Other Ambulatory Visit: Payer: Self-pay | Admitting: *Deleted

## 2013-09-12 MED ORDER — CLONAZEPAM 1 MG PO TABS: ORAL_TABLET | ORAL | Status: DC

## 2013-09-12 MED ORDER — PREGABALIN 75 MG PO CAPS: ORAL_CAPSULE | ORAL | Status: DC

## 2013-09-12 NOTE — Telephone Encounter (Signed)
[facility] 

## 2013-09-19 ENCOUNTER — Emergency Department (HOSPITAL_COMMUNITY): Payer: PRIVATE HEALTH INSURANCE

## 2013-09-19 ENCOUNTER — Encounter (HOSPITAL_BASED_OUTPATIENT_CLINIC_OR_DEPARTMENT_OTHER): Payer: PRIVATE HEALTH INSURANCE | Attending: Plastic Surgery

## 2013-09-19 ENCOUNTER — Inpatient Hospital Stay (HOSPITAL_COMMUNITY)
Admission: EM | Admit: 2013-09-19 | Discharge: 2013-09-25 | DRG: 166 | Disposition: A | Discharge status: Skilled Nursing Facility | Payer: PRIVATE HEALTH INSURANCE | Attending: Internal Medicine | Admitting: Internal Medicine

## 2013-09-19 ENCOUNTER — Encounter (HOSPITAL_COMMUNITY): Payer: Self-pay | Admitting: Emergency Medicine

## 2013-09-19 DIAGNOSIS — I82409 Acute embolism and thrombosis of unspecified deep veins of unspecified lower extremity: Secondary | ICD-10-CM

## 2013-09-19 DIAGNOSIS — L8994 Pressure ulcer of unspecified site, stage 4: Secondary | ICD-10-CM | POA: Diagnosis present

## 2013-09-19 DIAGNOSIS — E876 Hypokalemia: Secondary | ICD-10-CM

## 2013-09-19 DIAGNOSIS — F319 Bipolar disorder, unspecified: Secondary | ICD-10-CM

## 2013-09-19 DIAGNOSIS — G4700 Insomnia, unspecified: Secondary | ICD-10-CM

## 2013-09-19 DIAGNOSIS — G547 Phantom limb syndrome without pain: Secondary | ICD-10-CM | POA: Diagnosis present

## 2013-09-19 DIAGNOSIS — S72009A Fracture of unspecified part of neck of unspecified femur, initial encounter for closed fracture: Secondary | ICD-10-CM

## 2013-09-19 DIAGNOSIS — D62 Acute posthemorrhagic anemia: Secondary | ICD-10-CM

## 2013-09-19 DIAGNOSIS — A419 Sepsis, unspecified organism: Secondary | ICD-10-CM

## 2013-09-19 DIAGNOSIS — R34 Anuria and oliguria: Secondary | ICD-10-CM | POA: Diagnosis not present

## 2013-09-19 DIAGNOSIS — I2699 Other pulmonary embolism without acute cor pulmonale: Principal | ICD-10-CM | POA: Diagnosis present

## 2013-09-19 DIAGNOSIS — R0902 Hypoxemia: Secondary | ICD-10-CM

## 2013-09-19 DIAGNOSIS — N390 Urinary tract infection, site not specified: Secondary | ICD-10-CM

## 2013-09-19 DIAGNOSIS — E785 Hyperlipidemia, unspecified: Secondary | ICD-10-CM

## 2013-09-19 DIAGNOSIS — S78119A Complete traumatic amputation at level between unspecified hip and knee, initial encounter: Secondary | ICD-10-CM

## 2013-09-19 DIAGNOSIS — N319 Neuromuscular dysfunction of bladder, unspecified: Secondary | ICD-10-CM | POA: Diagnosis present

## 2013-09-19 DIAGNOSIS — G8220 Paraplegia, unspecified: Secondary | ICD-10-CM | POA: Diagnosis present

## 2013-09-19 DIAGNOSIS — N179 Acute kidney failure, unspecified: Secondary | ICD-10-CM

## 2013-09-19 DIAGNOSIS — K219 Gastro-esophageal reflux disease without esophagitis: Secondary | ICD-10-CM | POA: Diagnosis present

## 2013-09-19 DIAGNOSIS — B1920 Unspecified viral hepatitis C without hepatic coma: Secondary | ICD-10-CM

## 2013-09-19 DIAGNOSIS — J9819 Other pulmonary collapse: Secondary | ICD-10-CM | POA: Diagnosis present

## 2013-09-19 DIAGNOSIS — L89109 Pressure ulcer of unspecified part of back, unspecified stage: Secondary | ICD-10-CM | POA: Diagnosis present

## 2013-09-19 DIAGNOSIS — D689 Coagulation defect, unspecified: Secondary | ICD-10-CM

## 2013-09-19 DIAGNOSIS — G9340 Encephalopathy, unspecified: Secondary | ICD-10-CM

## 2013-09-19 DIAGNOSIS — G546 Phantom limb syndrome with pain: Secondary | ICD-10-CM

## 2013-09-19 DIAGNOSIS — IMO0002 Reserved for concepts with insufficient information to code with codable children: Secondary | ICD-10-CM

## 2013-09-19 DIAGNOSIS — S31000A Unspecified open wound of lower back and pelvis without penetration into retroperitoneum, initial encounter: Secondary | ICD-10-CM | POA: Diagnosis present

## 2013-09-19 DIAGNOSIS — G8929 Other chronic pain: Secondary | ICD-10-CM

## 2013-09-19 DIAGNOSIS — I10 Essential (primary) hypertension: Secondary | ICD-10-CM

## 2013-09-19 DIAGNOSIS — E43 Unspecified severe protein-calorie malnutrition: Secondary | ICD-10-CM | POA: Diagnosis present

## 2013-09-19 LAB — URINALYSIS, ROUTINE W REFLEX MICROSCOPIC
Bilirubin Urine: NEGATIVE
Glucose, UA: NEGATIVE mg/dL
KETONES UR: NEGATIVE mg/dL
Nitrite: NEGATIVE
PH: 7 (ref 5.0–8.0)
PROTEIN: 30 mg/dL — AB
Specific Gravity, Urine: 1.015 (ref 1.005–1.030)
Urobilinogen, UA: 0.2 mg/dL (ref 0.0–1.0)

## 2013-09-19 LAB — BLOOD GAS, ARTERIAL
Acid-base deficit: 1.9 mmol/L (ref 0.0–2.0)
Bicarbonate: 23.1 mEq/L (ref 20.0–24.0)
Drawn by: 103701
O2 Content: 6 L/min
O2 Saturation: 92.1 %
PATIENT TEMPERATURE: 98.6
TCO2: 21 mmol/L (ref 0–100)
pCO2 arterial: 42.2 mmHg (ref 35.0–45.0)
pH, Arterial: 7.356 (ref 7.350–7.450)
pO2, Arterial: 67.2 mmHg — ABNORMAL LOW (ref 80.0–100.0)

## 2013-09-19 LAB — CBC WITH DIFFERENTIAL/PLATELET
Basophils Absolute: 0.1 10*3/uL (ref 0.0–0.1)
Basophils Relative: 0 % (ref 0–1)
EOS ABS: 0.2 10*3/uL (ref 0.0–0.7)
Eosinophils Relative: 2 % (ref 0–5)
HCT: 37.7 % — ABNORMAL LOW (ref 39.0–52.0)
HEMOGLOBIN: 11.7 g/dL — AB (ref 13.0–17.0)
Lymphocytes Relative: 11 % — ABNORMAL LOW (ref 12–46)
Lymphs Abs: 1.4 10*3/uL (ref 0.7–4.0)
MCH: 27.5 pg (ref 26.0–34.0)
MCHC: 31 g/dL (ref 30.0–36.0)
MCV: 88.7 fL (ref 78.0–100.0)
Monocytes Absolute: 0.7 10*3/uL (ref 0.1–1.0)
Monocytes Relative: 6 % (ref 3–12)
NEUTROS ABS: 9.6 10*3/uL — AB (ref 1.7–7.7)
NEUTROS PCT: 81 % — AB (ref 43–77)
PLATELETS: 307 10*3/uL (ref 150–400)
RBC: 4.25 MIL/uL (ref 4.22–5.81)
RDW: 17.5 % — ABNORMAL HIGH (ref 11.5–15.5)
WBC: 11.9 10*3/uL — ABNORMAL HIGH (ref 4.0–10.5)

## 2013-09-19 LAB — I-STAT TROPONIN, ED: Troponin i, poc: 0 ng/mL (ref 0.00–0.08)

## 2013-09-19 LAB — URINE MICROSCOPIC-ADD ON

## 2013-09-19 LAB — MRSA PCR SCREENING: MRSA by PCR: NEGATIVE

## 2013-09-19 LAB — COMPREHENSIVE METABOLIC PANEL
ALBUMIN: 3.4 g/dL — AB (ref 3.5–5.2)
ALK PHOS: 205 U/L — AB (ref 39–117)
ALT: 11 U/L (ref 0–53)
AST: 17 U/L (ref 0–37)
BILIRUBIN TOTAL: 0.6 mg/dL (ref 0.3–1.2)
BUN: 7 mg/dL (ref 6–23)
CHLORIDE: 102 meq/L (ref 96–112)
CO2: 23 mEq/L (ref 19–32)
Calcium: 9 mg/dL (ref 8.4–10.5)
Creatinine, Ser: 0.71 mg/dL (ref 0.50–1.35)
GFR calc Af Amer: >90 mL/min (ref >90)
GFR calc non Af Amer: >90 mL/min (ref >90)
Glucose, Bld: 105 mg/dL — ABNORMAL HIGH (ref 70–99)
POTASSIUM: 4.3 meq/L (ref 3.7–5.3)
SODIUM: 138 meq/L (ref 137–147)
TOTAL PROTEIN: 7.4 g/dL (ref 6.0–8.3)

## 2013-09-19 LAB — PRO B NATRIURETIC PEPTIDE: Pro B Natriuretic peptide (BNP): 123.8 pg/mL (ref 0–125)

## 2013-09-19 LAB — GLUCOSE, CAPILLARY: GLUCOSE-CAPILLARY: 88 mg/dL (ref 70–99)

## 2013-09-19 LAB — I-STAT CG4 LACTIC ACID, ED: LACTIC ACID, VENOUS: 0.9 mmol/L (ref 0.5–2.2)

## 2013-09-19 LAB — APTT: APTT: 34 s (ref 24–37)

## 2013-09-19 LAB — PROTIME-INR
INR: 1.2 (ref 0.00–1.49)
Prothrombin Time: 14.9 seconds (ref 11.6–15.2)

## 2013-09-19 MED ORDER — QUETIAPINE FUMARATE 50 MG PO TABS
50.0000 mg | ORAL_TABLET | Freq: Every day | ORAL | Status: DC
  Administered 2013-09-19 – 2013-09-24 (×6): 50 mg via ORAL
  Filled 2013-09-19 (×7): qty 1

## 2013-09-19 MED ORDER — SODIUM CHLORIDE 0.9 % IV SOLN: 250.0000 mL | INTRAVENOUS | Status: DC | PRN

## 2013-09-19 MED ORDER — PANTOPRAZOLE SODIUM 40 MG PO TBEC
40.0000 mg | DELAYED_RELEASE_TABLET | ORAL | Status: DC
  Administered 2013-09-20 – 2013-09-25 (×11): 40 mg via ORAL
  Filled 2013-09-19 (×11): qty 1

## 2013-09-19 MED ORDER — IOHEXOL 350 MG/ML SOLN
100.0000 mL | Freq: Once | INTRAVENOUS | Status: AC | PRN
  Administered 2013-09-19: 100 mL via INTRAVENOUS

## 2013-09-19 MED ORDER — DULOXETINE HCL 30 MG PO CPEP
30.0000 mg | ORAL_CAPSULE | Freq: Every day | ORAL | Status: DC
  Administered 2013-09-19 – 2013-09-24 (×6): 30 mg via ORAL
  Filled 2013-09-19 (×7): qty 1

## 2013-09-19 MED ORDER — BACLOFEN 20 MG PO TABS
20.0000 mg | ORAL_TABLET | ORAL | Status: DC
  Administered 2013-09-19 – 2013-09-25 (×17): 20 mg via ORAL
  Filled 2013-09-19 (×21): qty 1

## 2013-09-19 MED ORDER — ASPIRIN 300 MG RE SUPP
300.0000 mg | RECTAL | Status: AC
  Filled 2013-09-19: qty 1

## 2013-09-19 MED ORDER — HEPARIN (PORCINE) IN NACL 100-0.45 UNIT/ML-% IJ SOLN
2000.0000 [IU]/h | INTRAMUSCULAR | Status: AC
  Administered 2013-09-19: 1500 [IU]/h via INTRAVENOUS
  Administered 2013-09-20: 2000 [IU]/h via INTRAVENOUS
  Administered 2013-09-20: 1900 [IU]/h via INTRAVENOUS
  Administered 2013-09-20: 1750 [IU]/h via INTRAVENOUS
  Filled 2013-09-19 (×6): qty 250

## 2013-09-19 MED ORDER — ATORVASTATIN CALCIUM 10 MG PO TABS
10.0000 mg | ORAL_TABLET | Freq: Every day | ORAL | Status: DC
  Administered 2013-09-19 – 2013-09-24 (×6): 10 mg via ORAL
  Filled 2013-09-19 (×7): qty 1

## 2013-09-19 MED ORDER — ASPIRIN 81 MG PO CHEW
324.0000 mg | CHEWABLE_TABLET | ORAL | Status: AC
  Administered 2013-09-19: 324 mg via ORAL
  Filled 2013-09-19: qty 4

## 2013-09-19 MED ORDER — HEPARIN BOLUS VIA INFUSION: 2500.0000 [IU] | Freq: Once | INTRAVENOUS | Status: DC

## 2013-09-19 MED ORDER — SODIUM CHLORIDE 0.9 % IV SOLN
INTRAVENOUS | Status: DC
  Administered 2013-09-19: 21:00:00 via INTRAVENOUS
  Administered 2013-09-20: 75 mL/h via INTRAVENOUS
  Administered 2013-09-21 – 2013-09-24 (×5): via INTRAVENOUS

## 2013-09-19 MED ORDER — CLONAZEPAM 0.5 MG PO TABS
0.5000 mg | ORAL_TABLET | Freq: Two times a day (BID) | ORAL | Status: DC
  Administered 2013-09-19 – 2013-09-25 (×11): 0.5 mg via ORAL
  Filled 2013-09-19 (×12): qty 1

## 2013-09-19 MED ORDER — INSULIN ASPART 100 UNIT/ML ~~LOC~~ SOLN
0.0000 [IU] | SUBCUTANEOUS | Status: DC
Start: 2013-09-19 — End: 2013-09-22
  Administered 2013-09-20: 1 [IU] via SUBCUTANEOUS
  Administered 2013-09-20: 3 [IU] via SUBCUTANEOUS

## 2013-09-19 NOTE — ED Notes (Signed)
Per EMS- Patient is a resident of [facility]. Staff noted that the patient had low sats at 85%. Patient was recently hospitalized for a right hip fracture/amputation. Patient placed on O2 $L/min via  and sats increased to 91%.Decdreased lung sounds in the bases. Staff confirm that the patient is at his norm with LOC. Patient also has an indwelling catheter upon arrival tot he ED.

## 2013-09-19 NOTE — ED Notes (Signed)
Patient transported to CT 
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
## 2013-09-19 NOTE — ED Notes (Signed)
CT alerted pt ready for scan
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
## 2013-09-19 NOTE — Progress Notes (Deleted)
Chaplain responded to code blue. No family present. Communicated with patient's RN who will page chaplain when family arrives if they need emotional/spiritual support.   Ethelene Browns [phone]

## 2013-09-20 ENCOUNTER — Inpatient Hospital Stay (HOSPITAL_COMMUNITY): Payer: PRIVATE HEALTH INSURANCE

## 2013-09-20 DIAGNOSIS — I82409 Acute embolism and thrombosis of unspecified deep veins of unspecified lower extremity: Secondary | ICD-10-CM

## 2013-09-20 DIAGNOSIS — I517 Cardiomegaly: Secondary | ICD-10-CM

## 2013-09-20 DIAGNOSIS — I2699 Other pulmonary embolism without acute cor pulmonale: Secondary | ICD-10-CM

## 2013-09-20 LAB — HEPARIN LEVEL (UNFRACTIONATED)
Heparin Unfractionated: 0.24 IU/mL — ABNORMAL LOW (ref 0.30–0.70)
Heparin Unfractionated: 0.24 IU/mL — ABNORMAL LOW (ref 0.30–0.70)
Heparin Unfractionated: 0.5 IU/mL (ref 0.30–0.70)
Heparin Unfractionated: 0.2 IU/mL — ABNORMAL LOW (ref 0.30–0.70)
Heparin Unfractionated: <0.1 IU/mL — ABNORMAL LOW (ref 0.30–0.70)

## 2013-09-20 LAB — CBC
HEMATOCRIT: 36.5 % — AB (ref 39.0–52.0)
Hemoglobin: 11.6 g/dL — ABNORMAL LOW (ref 13.0–17.0)
MCH: 28.4 pg (ref 26.0–34.0)
MCHC: 31.8 g/dL (ref 30.0–36.0)
MCV: 89.5 fL (ref 78.0–100.0)
PLATELETS: 282 10*3/uL (ref 150–400)
RBC: 4.08 MIL/uL — ABNORMAL LOW (ref 4.22–5.81)
RDW: 17.9 % — AB (ref 11.5–15.5)
WBC: 9 10*3/uL (ref 4.0–10.5)

## 2013-09-20 LAB — BASIC METABOLIC PANEL
BUN: 8 mg/dL (ref 6–23)
CALCIUM: 8.5 mg/dL (ref 8.4–10.5)
CO2: 22 mEq/L (ref 19–32)
CREATININE: 0.72 mg/dL (ref 0.50–1.35)
Chloride: 105 mEq/L (ref 96–112)
GFR calc non Af Amer: >90 mL/min (ref >90)
Glucose, Bld: 94 mg/dL (ref 70–99)
Potassium: 4.1 mEq/L (ref 3.7–5.3)
SODIUM: 140 meq/L (ref 137–147)

## 2013-09-20 LAB — GLUCOSE, CAPILLARY
GLUCOSE-CAPILLARY: 117 mg/dL — AB (ref 70–99)
GLUCOSE-CAPILLARY: 122 mg/dL — AB (ref 70–99)
Glucose-Capillary: 109 mg/dL — ABNORMAL HIGH (ref 70–99)
Glucose-Capillary: 211 mg/dL — ABNORMAL HIGH (ref 70–99)
Glucose-Capillary: 89 mg/dL (ref 70–99)
Glucose-Capillary: 98 mg/dL (ref 70–99)

## 2013-09-20 LAB — MAGNESIUM: MAGNESIUM: 2.2 mg/dL (ref 1.5–2.5)

## 2013-09-20 LAB — PHOSPHORUS: Phosphorus: 4 mg/dL (ref 2.3–4.6)

## 2013-09-20 MED ORDER — HEPARIN BOLUS VIA INFUSION
3000.0000 [IU] | Freq: Once | INTRAVENOUS | Status: AC
  Administered 2013-09-20: 3000 [IU] via INTRAVENOUS
  Filled 2013-09-20: qty 3000

## 2013-09-20 MED ORDER — OXYCODONE HCL 5 MG PO TABS
5.0000 mg | ORAL_TABLET | ORAL | Status: DC | PRN
  Administered 2013-09-20 – 2013-09-25 (×11): 5 mg via ORAL
  Filled 2013-09-20 (×11): qty 1

## 2013-09-20 MED ORDER — WARFARIN VIDEO
Freq: Once | Status: AC
  Administered 2013-09-21: 09:00:00

## 2013-09-20 MED ORDER — WARFARIN - PHARMACIST DOSING INPATIENT: Freq: Every day | Status: DC

## 2013-09-20 MED ORDER — COUMADIN BOOK
Freq: Once | Status: AC
  Administered 2013-09-20: 16:00:00
  Filled 2013-09-20: qty 1

## 2013-09-20 MED ORDER — SODIUM CHLORIDE 0.9 % IV BOLUS (SEPSIS)
500.0000 mL | Freq: Once | INTRAVENOUS | Status: AC
  Administered 2013-09-20: 500 mL via INTRAVENOUS

## 2013-09-20 MED ORDER — HEPARIN BOLUS VIA INFUSION
2000.0000 [IU] | Freq: Once | INTRAVENOUS | Status: AC
  Administered 2013-09-20: 2000 [IU] via INTRAVENOUS
  Filled 2013-09-20: qty 2000

## 2013-09-20 MED ORDER — WARFARIN SODIUM 10 MG PO TABS
10.0000 mg | ORAL_TABLET | Freq: Once | ORAL | Status: AC
  Administered 2013-09-20: 10 mg via ORAL
  Filled 2013-09-20: qty 1

## 2013-09-20 MED ORDER — FENTANYL 75 MCG/HR TD PT72
75.0000 ug | MEDICATED_PATCH | TRANSDERMAL | Status: DC
  Administered 2013-09-20 – 2013-09-23 (×2): 75 ug via TRANSDERMAL
  Filled 2013-09-20 (×2): qty 1

## 2013-09-20 NOTE — Progress Notes (Signed)
[place] Progress Note Patient Name: Samuel [name] DOB: 22-Jan-1956 MRN: [id number]  Date of Service  09/20/2013   HPI/Events of Note   Pt has hx of chronic pain.  On fentanyl patch as outpt.  eICU Interventions   Will re-order fentanyl patch at outpt dose.   Intervention Category Minor Interventions: Other:  Samuel [name] 09/20/2013, 8:39 PM

## 2013-09-20 NOTE — Progress Notes (Signed)
ANTICOAGULATION CONSULT NOTE Pharmacy Consult for heparin Indication: PE  No Known Allergies  Patient Measurements: Height: 5\' 4"  (162.6 cm) Weight: 230 lb 6.1 oz (104.5 kg) IBW/kg (Calculated) : 59.2 Heparin Dosing Weight: 85kg  Vital Signs: Temp: 97.5 F (36.4 C) (02/24 0022) Temp src: Oral (02/24 0022) BP: 97/61 mmHg (02/24 0100) Pulse Rate: 63 (02/24 0100)  Labs:  Recent Labs  09/19/13 1415 09/19/13 1725 09/20/13 0014 09/20/13 0019  HGB 11.7*  --  11.6*  --   HCT 37.7*  --  36.5*  --   PLT 307  --  282  --   APTT  --  34  --   --   LABPROT  --  14.9  --   --   INR  --  1.20  --   --   HEPARINUNFRC  --   --   --  0.24*  CREATININE 0.71  --   --  0.72    Estimated Creatinine Clearance: 112.7 ml/min (by C-G formula based on Cr of 0.72).  Assessment: 58 yo male with PE for heparin  Goal of Therapy:  Heparin level 0.3-0.7 units/ml Monitor platelets by anticoagulation protocol: Yes   Plan:  Heparin 2000 units IV bolus, then increase heparin 1750 units/hr Check heparin level in 6 hours.  Phillis Knack, PharmD, BCPS  09/20/2013,1:56 AM

## 2013-09-20 NOTE — Progress Notes (Signed)
ANTICOAGULATION CONSULT NOTE - Follow-up  Pharmacy Consult for heparin + warfarin Indication: PE  No Known Allergies  Patient Measurements: Height: 5\' 4"  (162.6 cm) Weight: 230 lb 6.1 oz (104.5 kg) IBW/kg (Calculated) : 59.2 Heparin Dosing Weight: 85kg  Vital Signs: Temp: 97.6 F (36.4 C) (02/24 1223) Temp src: Oral (02/24 1223) BP: 85/48 mmHg (02/24 0800) Pulse Rate: 67 (02/24 0800)  Labs:  Recent Labs  09/19/13 1415 09/19/13 1725 09/20/13 0014 09/20/13 0019 09/20/13 0730 09/20/13 1320  HGB 11.7*  --  11.6*  --   --   --   HCT 37.7*  --  36.5*  --   --   --   PLT 307  --  282  --   --   --   APTT  --  34  --   --   --   --   LABPROT  --  14.9  --   --   --   --   INR  --  1.20  --   --   --   --   HEPARINUNFRC  --   --   --  0.24*  0.24* 0.50 0.20*  CREATININE 0.71  --   --  0.72  --   --     Estimated Creatinine Clearance: 112.7 ml/min (by C-G formula based on Cr of 0.72).  Assessment: 75 YOM admitted with hypoxia, he has history of paraplegia (appears to be wheelchair bound). He is s/p hip fracture after fall from wheelchair 08/28/13 s/p OR 09/02/2013. CTA revealed "massive" PE with large clot burden and evidence of R heart strain so heparin was initiated. Heparin level is now subtherapeutic at 0.2. CBC is stable and no bleeding noted. Also pt to start warfarin tonight. Baseline INR is 1.2.   Goal of Therapy:  Heparin level 0.3-0.7 units/ml Monitor platelets by anticoagulation protocol: Yes INR 2-3   Plan:  1. Heparin bolus 2000 units IV x 1 then increase to 1900 units/hr 2. Check a 6 hour heparin level  3. Daily heparin level and CBC 4. Warfarin 10mg  PO x 1 tonight 5. Daily INR 6. Coumadin book + video to patient  Salome Arnt, PharmD, BCPS Pager # [phone] 09/20/2013 2:59 PM

## 2013-09-20 NOTE — Progress Notes (Addendum)
*  PRELIMINARY RESULTS* Vascular Ultrasound Right lower extremity venous duplex has been completed.  Preliminary findings: DVT noted in the right PTV. Could not imaged left leg due to amputation.   Raeford Razor, RVT Landry Mellow, RDMS, RVT  09/20/2013, 11:16 AM

## 2013-09-20 NOTE — Progress Notes (Signed)
Pt transferred to [facility] [place] , room 25.  Report given to Nona Dell, RN.  Will monitor.

## 2013-09-20 NOTE — Progress Notes (Signed)
ANTICOAGULATION CONSULT NOTE - Follow-up  Pharmacy Consult for heparin Indication: PE  No Known Allergies  Patient Measurements: Height: 5\' 4"  (162.6 cm) Weight: 230 lb 6.1 oz (104.5 kg) IBW/kg (Calculated) : 59.2 Heparin Dosing Weight: 85kg  Vital Signs: Temp: 97.5 F (36.4 C) (02/24 0809) Temp src: Oral (02/24 0809) BP: 93/57 mmHg (02/24 0600) Pulse Rate: 62 (02/24 0600)  Labs:  Recent Labs  09/19/13 1415 09/19/13 1725 09/20/13 0014 09/20/13 0019 09/20/13 0730  HGB 11.7*  --  11.6*  --   --   HCT 37.7*  --  36.5*  --   --   PLT 307  --  282  --   --   APTT  --  34  --   --   --   LABPROT  --  14.9  --   --   --   INR  --  1.20  --   --   --   HEPARINUNFRC  --   --   --  0.24*  0.24* 0.50  CREATININE 0.71  --   --  0.72  --     Estimated Creatinine Clearance: 112.7 ml/min (by C-G formula based on Cr of 0.72).  Assessment: 34 YOM admitted with hypoxia, he has history of paraplegia (appears to be wheelchair bound). He is s/p hip fracture after fall from wheelchair 08/28/13 s/p OR 09/02/2013. CTA revealed "massive" PE with large clot burden and evidence of R heart strain so heparin was initiated. Heparin level is now therapeutic at 0.5. CBC is stable and no bleeding noted.   Goal of Therapy:  Heparin level 0.3-0.7 units/ml Monitor platelets by anticoagulation protocol: Yes   Plan:  1. Continue heparin gtt at 1750 units/hr 2. Check a 6 hour heparin level to confirm dosing 3. Daily heparin level and CBC 4. F/u transition to oral anticoagulation  Salome Arnt, PharmD, BCPS Pager # [phone] 09/20/2013 8:42 AM

## 2013-09-20 NOTE — Progress Notes (Signed)
ANTICOAGULATION CONSULT NOTE Pharmacy Consult for heparin Indication: PE  No Known Allergies  Patient Measurements: Height: 5\' 4"  (162.6 cm) Weight: 230 lb 6.1 oz (104.5 kg) IBW/kg (Calculated) : 59.2 Heparin Dosing Weight: 85kg  Vital Signs: Temp: 98.3 F (36.8 C) (02/24 1958) Temp src: Oral (02/24 1958) BP: 161/80 mmHg (02/24 1958) Pulse Rate: 77 (02/24 1958)  Labs:  Recent Labs  09/19/13 1415 09/19/13 1725 09/20/13 0014  09/20/13 0019 09/20/13 0730 09/20/13 1320 09/20/13 2218  HGB 11.7*  --  11.6*  --   --   --   --   --   HCT 37.7*  --  36.5*  --   --   --   --   --   PLT 307  --  282  --   --   --   --   --   APTT  --  34  --   --   --   --   --   --   LABPROT  --  14.9  --   --   --   --   --   --   INR  --  1.20  --   --   --   --   --   --   HEPARINUNFRC  --   --   --   < > 0.24*  0.24* 0.50 0.20* <0.10*  CREATININE 0.71  --   --   --  0.72  --   --   --   < > = values in this interval not displayed.  Estimated Creatinine Clearance: 112.7 ml/min (by C-G formula based on Cr of 0.72).  Assessment: 58 yo male with PE for heparin.  Per RN, heparin was off for unknown length of time when patient was transferred to unit.  Goal of Therapy:  Heparin level 0.3-0.7 units/ml Monitor platelets by anticoagulation protocol: Yes   Plan:  Heparin 3000 units IV bolus, then increase heparin 2000 units/hr Follow-up am labs.   Phillis Knack, PharmD, BCPS  09/20/2013,11:01 PM

## 2013-09-20 NOTE — Clinical Social Work Note (Signed)
Clinical Social Work Department BRIEF PSYCHOSOCIAL ASSESSMENT 09/20/2013  Patient:  Samuel [name], Samuel [name]     Account Number:  [id number]     Admit date:  09/19/2013  Clinical Social Worker:  Wylene Men  Date/Time:  09/20/2013 02:52 PM  Referred by:  Physician  Date Referred:  09/20/2013 Referred for  SNF Placement   Other Referral:   none   Interview type:  Patient Other interview type:   none    PSYCHOSOCIAL DATA Living Status:  FACILITY Admitted from facility:  [facility] Level of care:  [place] Primary support name:  [name] and [name]- daughters Primary support relationship to patient:  CHILD, ADULT Degree of support available:   adequate    CURRENT CONCERNS Current Concerns  Post-Acute Placement   Other Concerns:   none    SOCIAL WORK ASSESSMENT / PLAN Pt is from [facility] and pt reports wishing to return upon being medically dc.  Pt is alert and oriented x4.  Pt reports having two supportive daughters.  Pt is anxious regarding being in the hospital and [place], SNF placing his things in storage.  Pt states that he has contacted [place] to discuss this.  CSW contacted [facility], left message with admission's coordinator re: this issue for the pt.    Pt is hopeful when reviewing his prognosis and looking forward to getting out of the hospital and back to [place] where he calls home.    CSW continuing to follow.   Assessment/plan status:  Psychosocial Support/Ongoing Assessment of Needs Other assessment/ plan:   none   Information/referral to community resources:   SNF    PATIENT'S/FAMILY'S RESPONSE TO PLAN OF CARE: Pt is agreeable to return to SNF upon being medically dc. Pt was also appreciative of CSW help contacting [place] regarding his things not being in storage.       Nonnie Done, [place] [phone]  Clinical Social Work

## 2013-09-20 NOTE — Progress Notes (Signed)
Name: Samuel [name] MRN: [id number] DOB: 10-27-1955    ADMISSION DATE:  09/19/2013 CONSULTATION DATE:  09/19/2013  REFERRING MD :  [facility]P [facility] PRIMARY SERVICE: PCCM  CHIEF COMPLAINT:  PE  BRIEF PATIENT DESCRIPTION: 58 yr old paraplegia, recent hip surgery from fall, now submassive PE with desat to low 80's on RA.  SIGNIFICANT EVENTS / STUDIES:  2/23 ct chest>>>Extensive bilateral pulmonary embolus with heavy clot burden and  evidence of right heart strain, compatible with massive pulmonary Embolus. Early infarct base? 2/24 duplex DVT noted in the right PTV. 2/24 echo -nml LV fn, nml RVSF   LINES / TUBES: chronnic foley>>>2/23>>>  CULTURES: none  ANTIBIOTICS: none  SUBJECTIVE: decreased UOP, bolused 500 cc overnight. Breathing well on [place] this morning. Afebrile Denies CP, dyspnea  VITAL SIGNS: Temp:  [97.5 F (36.4 C)-99 F (37.2 C)] 97.8 F (36.6 C) (02/24 0358) Pulse Rate:  [54-95] 62 (02/24 0600) Resp:  [14-21] 17 (02/24 0600) BP: (84-138)/(49-78) 93/57 mmHg (02/24 0600) SpO2:  [85 %-98 %] 92 % (02/24 0600) Weight:  [104.5 kg (230 lb 6.1 oz)] 104.5 kg (230 lb 6.1 oz) (02/23 2000) HEMODYNAMICS:   VENTILATOR SETTINGS:   INTAKE / OUTPUT: Intake/Output     02/23 0701 - 02/24 0700   I.V. 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   LABS:  CBC  Recent Labs Lab 09/19/13 1415 09/20/13 0014  WBC 11.9* 9.0  HGB 11.7* 11.6*  HCT 37.7* 36.5*  PLT 307 282   Coag's  Recent Labs Lab 09/19/13 1725  APTT 34  INR 1.20   BMET  Recent Labs Lab 09/19/13 1415 09/20/13 0019   NA 138 140  K 4.3 4.1  CL 102 105  CO2 23 22  BUN 7 8  CREATININE 0.71 0.72  GLUCOSE 105* 94   Electrolytes  Recent Labs Lab 09/19/13 1415 09/20/13 0019  CALCIUM 9.0 8.5  MG  --  2.2  PHOS  --  4.0   Sepsis Markers  Recent Labs Lab 09/19/13 1542  LATICACIDVEN 0.90   ABG  Recent Labs Lab 09/19/13 1534  PHART 7.356  PCO2ART 42.2  PO2ART 67.2*   Liver Enzymes  Recent Labs Lab 09/19/13 1415  AST 17  ALT 11  ALKPHOS 205*  BILITOT 0.6  ALBUMIN 3.4*   Cardiac Enzymes  Recent Labs Lab 09/19/13 1415  PROBNP 123.8   Glucose  Recent Labs Lab 09/19/13 1925 09/20/13 0021 09/20/13 0358  GLUCAP 88 211* 89    Imaging Ct Angio Chest Pe W/cm &/or Wo Cm  09/19/2013   CLINICAL DATA:  Shortness of breath.  Hypoxia.  EXAM: CT ANGIOGRAPHY CHEST WITH CONTRAST  TECHNIQUE: Multidetector CT imaging of the chest was performed using the standard protocol during bolus administration of intravenous contrast. Multiplanar CT image reconstructions and MIPs were obtained to evaluate the vascular anatomy.  CONTRAST:  139mL OMNIPAQUE IOHEXOL 350 MG/ML SOLN  COMPARISON:  DG CHEST 1V PORT dated 09/19/2013  FINDINGS: Extensive bilateral pulmonary embolus with saddle emboli noted. Clot burden is heavy. Right ventricular to left ventricular diameter ratio  abnormally high at 1.0.  AP window lymph node 0.9 cm in short axis. Right infrahilar lymph node 0.9 cm in short axis.  Patchy airspace opacities are present in the left lower lobe and left upper lobe due. There is some volume loss in the left lower lobe and atelectasis in the right lower lobe. Peripheral interstitial accentuation in the right middle lobe noted.  There appears to be a catheter in the thoracic spine.  Fatty liver noted.  Small hiatal hernia.  Review of the MIP images confirms the above findings.  IMPRESSION: 1. Extensive bilateral pulmonary embolus with heavy clot burden and evidence of right heart strain, compatible with  massive pulmonary embolus. 2. Bibasilar atelectasis, with patchy airspace opacities in the left upper lobe and left lower lobe which may reflect pulmonary hemorrhage or early infarct. 3. Fatty liver. 4. Small hiatal hernia.  Critical Value/emergent results were called by telephone at the time of interpretation on 09/19/2013 at 5:05 PM to Dr. Sherwood Gambler , who verbally acknowledged these results.   Electronically Signed   By: Sherryl Barters M.D.   On: 09/19/2013 17:06   Dg Chest Portable 1 View  09/19/2013   CLINICAL DATA:  58 year old male shortness of breath weakness. Initial encounter.  EXAM: PORTABLE CHEST - 1 VIEW  COMPARISON:  08/29/2013 and earlier.  FINDINGS: Portable AP semi upright view at 1434 hrs. Mildly lower lung volumes. Elevation of the right hemidiaphragm unchanged. Stable cardiac size and mediastinal contours. Visualized tracheal air column is within normal limits. No pneumothorax. No pleural effusion or consolidation. Patchy lung base opacity has not significantly changed and most resembles atelectasis.  IMPRESSION: Low lung volumes. Patchy lung base opacity not significantly changed and most resembles atelectasis.   Electronically Signed   By: Lars Pinks M.D.   On: 09/19/2013 14:52     CXR: low lung volumes, patchy lower lung fields, ?atelectasis  ASSESSMENT / PLAN:  PULMONARY  A: submassive PE, ATX  P:  IS if able   O2 support prn for sats >90%  CARDIOVASCULAR  A: sub massive PE (immobility, recent surgery)Trop neg  likely pa htn r/o chronic thromboembolic dz  HLD HTN P:  Echo assess pa pressures Recent OR, anemia, would NOT give any forms lytics unless declined  Hep drip, may need high levels given burden clot  Continue statin Hold home BP meds until pressures stabilize  RENAL  A: Normal renal fxn, chronic foley  P:  Continue foley  Saline at 75  Daily BMET   GASTROINTESTINAL  A: GERD P:  Feed  Home ppi   HEMATOLOGIC  A: PE, likely DVT  P:    Heparin Daily cbc start coumadin bridge   Consider life long coumadin given immobility and clot burden - consideration for filter placement in this patient with paraplegia and no likely change in functional status  INFECTIOUS  A: Chronic foley, at risk infarct  P:  UA no signs of infection pcxr in am   ENDOCRINE  A: Glu wnl  P:  cbg   NEUROLOGIC  A: bipolar, paraplegia, benzo dep, seizure P:  Baclofen  Reduce but continue benzo  Home regimen - cymbalta, seroquel  TODAY'S SUMMARY: heparin, plan transition to coumadin, , plan for transfer to tele Will ask Ortho- Alphonzo Severance to remove staples  Tommi Rumps, MD [place] PGY-2  I have personally obtained a history, examined the patient, evaluated laboratory and imaging results, formulated the assessment and plan and placed orders. CRITICAL CARE:  The patient is critically ill with multiple organ systems failure and requires high complexity decision making for assessment and support, frequent evaluation and titration of therapies, application of advanced monitoring technologies and extensive interpretation of multiple databases. Critical Care Time devoted to patient care services described in this note is 32 minutes.   Rigoberto Noel  [facility] Pager: [phone]  09/20/2013, 6:43 AM

## 2013-09-20 NOTE — Progress Notes (Signed)
[place] Progress Note Patient Name: Samuel [name] DOB: Apr 17, 1956 MRN: [id number]  Date of Service  09/20/2013   HPI/Events of Note  Oliguria with BP in the 63W to 46K systolic.  On heparin for PE.   eICU Interventions  Plan: 500 cc bolus of NS IV for oliguria   Intervention Category Intermediate Interventions: Oliguria - evaluation and management  DETERDING,ELIZABETH 09/20/2013, 4:20 AM

## 2013-09-20 NOTE — Progress Notes (Signed)
  Echocardiogram 2D Echocardiogram has been performed.  Samuel [name] 09/20/2013, 9:22 AM

## 2013-09-21 DIAGNOSIS — I82409 Acute embolism and thrombosis of unspecified deep veins of unspecified lower extremity: Secondary | ICD-10-CM | POA: Diagnosis present

## 2013-09-21 DIAGNOSIS — D62 Acute posthemorrhagic anemia: Secondary | ICD-10-CM

## 2013-09-21 DIAGNOSIS — S31000A Unspecified open wound of lower back and pelvis without penetration into retroperitoneum, initial encounter: Secondary | ICD-10-CM | POA: Diagnosis present

## 2013-09-21 DIAGNOSIS — N179 Acute kidney failure, unspecified: Secondary | ICD-10-CM

## 2013-09-21 LAB — GLUCOSE, CAPILLARY
GLUCOSE-CAPILLARY: 103 mg/dL — AB (ref 70–99)
GLUCOSE-CAPILLARY: 106 mg/dL — AB (ref 70–99)
GLUCOSE-CAPILLARY: 96 mg/dL (ref 70–99)
GLUCOSE-CAPILLARY: 106 mg/dL — AB (ref 70–99)
Glucose-Capillary: 105 mg/dL — ABNORMAL HIGH (ref 70–99)
Glucose-Capillary: 105 mg/dL — ABNORMAL HIGH (ref 70–99)
Glucose-Capillary: 99 mg/dL (ref 70–99)

## 2013-09-21 LAB — CBC
HCT: 35.1 % — ABNORMAL LOW (ref 39.0–52.0)
Hemoglobin: 11.1 g/dL — ABNORMAL LOW (ref 13.0–17.0)
MCH: 27.8 pg (ref 26.0–34.0)
MCHC: 31.6 g/dL (ref 30.0–36.0)
MCV: 88 fL (ref 78.0–100.0)
PLATELETS: 269 10*3/uL (ref 150–400)
RBC: 3.99 MIL/uL — ABNORMAL LOW (ref 4.22–5.81)
RDW: 17.1 % — ABNORMAL HIGH (ref 11.5–15.5)
WBC: 7 10*3/uL (ref 4.0–10.5)

## 2013-09-21 LAB — HEPARIN LEVEL (UNFRACTIONATED): Heparin Unfractionated: 0.38 IU/mL (ref 0.30–0.70)

## 2013-09-21 LAB — PROTIME-INR
INR: 1.34 (ref 0.00–1.49)
Prothrombin Time: 16.3 seconds — ABNORMAL HIGH (ref 11.6–15.2)

## 2013-09-21 MED ORDER — COLLAGENASE 250 UNIT/GM EX OINT
TOPICAL_OINTMENT | Freq: Every day | CUTANEOUS | Status: DC
  Administered 2013-09-21: 1 via TOPICAL
  Administered 2013-09-22 – 2013-09-24 (×3): via TOPICAL
  Filled 2013-09-21: qty 30

## 2013-09-21 MED ORDER — ALUM & MAG HYDROXIDE-SIMETH 200-200-20 MG/5ML PO SUSP
30.0000 mL | ORAL | Status: DC | PRN
  Administered 2013-09-22 – 2013-09-24 (×2): 30 mL via ORAL
  Filled 2013-09-21 (×2): qty 30

## 2013-09-21 MED ORDER — POLYVINYL ALCOHOL 1.4 % OP SOLN
1.0000 [drp] | OPHTHALMIC | Status: DC | PRN
Start: 2013-09-21 — End: 2013-09-25
  Administered 2013-09-21 – 2013-09-24 (×6): 1 [drp] via OPHTHALMIC
  Filled 2013-09-21: qty 15

## 2013-09-21 MED ORDER — WARFARIN SODIUM 10 MG PO TABS
10.0000 mg | ORAL_TABLET | Freq: Once | ORAL | Status: AC
  Administered 2013-09-21: 10 mg via ORAL
  Filled 2013-09-21: qty 1

## 2013-09-21 MED ORDER — ENOXAPARIN SODIUM 100 MG/ML ~~LOC~~ SOLN
100.0000 mg | Freq: Two times a day (BID) | SUBCUTANEOUS | Status: AC
  Administered 2013-09-21 – 2013-09-24 (×7): 100 mg via SUBCUTANEOUS
  Filled 2013-09-21 (×8): qty 1

## 2013-09-21 NOTE — Progress Notes (Signed)
Patient ID: Samuel [name]  male  HCW:[id number]    DOB: February 26, 1956    DOA: 09/19/2013  PCP: [name], MD  Assessment/Plan: Principal Problem:   Acute pulmonary embolism, bilateral, acute right leg DVT - Currently on heparin drip (started on 09/19/2013), Coumadin started 2/24  - CT chest showed extensive bilateral pulmonary embolus with heavy clot burden and evidence of right heart strain compatible with massive pulmonary embolus - Will DC heparin drip today and place on therapeutic Lovenox and continue Coumadin - 2-D echo done which showed normal LV function normal right ventricular systolic function - Per pulmonology, consider lifelong Coumadin given immobility, paraplegia and clot burden. Also recommended IVC filter placement in this patient with no likely in functional status -IR evaluation for IVC filter placement   Active Problems:   HTN (hypertension) - Currently stable    right Hip fracture  - s/p IM nailing   - d/w Dr Marlou Sa (Ortho), will see patient     DVT of lower extremity (deep venous thrombosis) right:  - as #1  Decubitus ulcer, stage 4  - wound care consult placed  Protein calorie malnutrition (severe) and hypoalbuminemia:  - Continue prostat    DVT Prophylaxis:  Code Status:  Family Communication:  Disposition:  Consultants:  PCCM  Ortho (Dr Marlou Sa)  Procedures:  CT angio chest  Antibiotics:  None    Subjective:    Objective: Weight change: -0.512 kg (-1 lb 2.1 oz)  Intake/Output Summary (Last 24 hours) at 09/21/13 1502 Last data filed at 09/21/13 0435  Gross per 24 hour  Intake      0 ml  Output   1800 ml  Net  -1800 ml   Blood pressure 130/67, pulse 63, temperature 98.4 F (36.9 C), temperature source Oral, resp. rate 18, height 5\' 4"  (1.626 m), weight 108.999 kg (240 lb 4.8 oz), SpO2 92.00%.  Physical Exam: General: Alert and awake, oriented x3, not in any acute distress. 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 Neuro: paraplegia  Lab Results: Basic Metabolic Panel:  Recent Labs Lab 09/19/13 1415 09/20/13 0019  NA 138 140  K 4.3 4.1  CL 102 105  CO2 23 22  GLUCOSE 105* 94  BUN 7 8  CREATININE 0.71 0.72  CALCIUM 9.0 8.5  MG  --  2.2  PHOS  --  4.0   Liver Function Tests:  Recent Labs Lab 09/19/13 1415  AST 17  ALT 11  ALKPHOS 205*  BILITOT 0.6  PROT 7.4  ALBUMIN 3.4*   No results found for this basename: LIPASE, AMYLASE,  in the last 168 hours No results found for this basename: AMMONIA,  in the last 168 hours CBC:  Recent Labs Lab 09/19/13 1415 09/20/13 0014 09/21/13 0730  WBC 11.9* 9.0 7.0  NEUTROABS 9.6*  --   --   HGB 11.7* 11.6* 11.1*  HCT 37.7* 36.5* 35.1*  MCV 88.7 89.5 88.0  PLT 307 282 269   Cardiac Enzymes: No results found for this basename: CKTOTAL, CKMB, CKMBINDEX, TROPONINI,  in the last 168 hours BNP: No components found with this basename: POCBNP,  CBG:  Recent Labs Lab 09/20/13 2000 09/20/13 2356 09/21/13 0432 09/21/13 0812 09/21/13 1243  GLUCAP 98 105* 99 103* 106*     Micro Results: Recent Results (from the past 240  hour(s))  CULTURE, BLOOD (ROUTINE X 2)     Status: None   Collection Time    09/19/13  2:15 PM      Result Value Ref Range Status   Specimen Description BLOOD LEFT ARM   Final   Special Requests BOTTLES DRAWN AEROBIC AND ANAEROBIC 2.5CC   Final   Culture  Setup Time     Final   Value: 09/19/2013 19:58     Performed at [facility]   Culture     Final   Value:        BLOOD CULTURE RECEIVED NO GROWTH TO DATE CULTURE WILL BE HELD FOR 5 DAYS BEFORE ISSUING A FINAL NEGATIVE REPORT     Performed at [facility]   Report Status PENDING   Incomplete  CULTURE, BLOOD (ROUTINE X  2)     Status: None   Collection Time    09/19/13  3:33 PM      Result Value Ref Range Status   Specimen Description BLOOD RIGHT HAND   Final   Special Requests BOTTLES DRAWN AEROBIC AND ANAEROBIC 8ML   Final   Culture  Setup Time     Final   Value: 09/19/2013 19:59     Performed at [facility]   Culture     Final   Value:        BLOOD CULTURE RECEIVED NO GROWTH TO DATE CULTURE WILL BE HELD FOR 5 DAYS BEFORE ISSUING A FINAL NEGATIVE REPORT     Performed at [facility]   Report Status PENDING   Incomplete  MRSA PCR SCREENING     Status: None   Collection Time    09/19/13  7:29 PM      Result Value Ref Range Status   MRSA by PCR NEGATIVE  NEGATIVE Final   Comment:            The GeneXpert MRSA Assay (FDA     approved for NASAL specimens     only), is one component of a     comprehensive MRSA colonization     surveillance program. It is not     intended to diagnose MRSA     infection nor to guide or     monitor treatment for     MRSA infections.    Studies/Results: Dg Chest 1 View  08/28/2013   CLINICAL DATA:  Fall.  EXAM: CHEST - 1 VIEW  COMPARISON:  Single view of the chest 03/01/2013.  FINDINGS: There is unchanged elevation of the right hemidiaphragm. Lungs appear clear. Heart size is upper normal. No pneumothorax or pleural effusion. Surgical clips in the left upper quadrant the abdomen are noted.  IMPRESSION: No acute finding.   Electronically Signed   By: Inge Rise M.D.   On: 08/28/2013 23:19   Dg Pelvis 1-2 Views  08/28/2013   CLINICAL DATA:  Fall.  Pain.  EXAM: PELVIS - 1-2 VIEW  COMPARISON:  None.  FINDINGS: There is partial visualization of a right intertrochanteric fracture. No other acute bony or joint abnormality is identified. Osteolysis of the femoral heads is worse on the left. There is destructive change of the left inferior pubic ramus with an appearance most compatible with chronic osteomyelitis.  IMPRESSION: Partial visualization of a right  intertrochanteric fracture.  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   Electronically Signed   By: Inge Rise M.D.   On:  08/28/2013 23:18   Dg Hip Operative Right  09/01/2013   CLINICAL DATA:  58 year old male undergoing repair of right hip fracture. Initial encounter.  EXAM: DG OPERATIVE RIGHT HIP  TECHNIQUE: A single spot fluoroscopic AP image of the right hip is submitted.  COMPARISON:  Preoperative film 08/28/2013.  FLUOROSCOPY TIME:  4 min 30 seconds.  FINDINGS: Four intraoperative coned-down fluoroscopic views of the right femur. Abnormal proximal right femur from the head to the intertrochanteric region with comminuted fracture again noted. Fragments traversed by intra medullary rod with proximal dual interlocking dynamic hip screws. Distal interlocking cortical screw placed.  IMPRESSION: ORIF right proximal femur.   Electronically Signed   By: Lars Pinks M.D.   On: 09/01/2013 19:59   Dg Femur Right  08/28/2013   CLINICAL DATA:  Right leg pain in a paraplegic patient.  EXAM: RIGHT FEMUR - 2 VIEW  COMPARISON:  None.  FINDINGS: The patient has an intertrochanteric right hip fracture with subtrochanteric extension. There is osteolysis of the right femoral head with remodeling of the right acetabulum consistent with chronic change. Bones appear osteopenic. There is fixation of a remote healed distal femoral fracture. Advanced for age degenerative disease about the right knee is partially visualized.  IMPRESSION: Right intertrochanteric fracture with subtrochanteric extension.  Chronic appearing osteolysis and remodeling of the right hip and acetabulum.  Remote healed distal right femur fracture with fixation hardware in place.   Electronically Signed   By: Inge Rise M.D.   On: 08/28/2013 23:15   Dg Tibia/fibula Right  08/28/2013   CLINICAL DATA:  Status post fall.  Right lower leg pain.  EXAM: RIGHT TIBIA AND  FIBULA - 2 VIEW  COMPARISON:  MRI right knee 12/31/2012.  FINDINGS: The patient has remote healed fractures of the distal right femur, proximal right tibia and fibula and distal right tibia and fibula. No acute bony or joint abnormality is identified. Bones are markedly osteopenic.  IMPRESSION: No acute finding.  Multiple remote fractures as above.   Electronically Signed   By: Inge Rise M.D.   On: 08/28/2013 23:16   Ct Angio Chest Pe W/cm &/or Wo Cm  09/19/2013   CLINICAL DATA:  Shortness of breath.  Hypoxia.  EXAM: CT ANGIOGRAPHY CHEST WITH CONTRAST  TECHNIQUE: Multidetector CT imaging of the chest was performed using the standard protocol during bolus administration of intravenous contrast. Multiplanar CT image reconstructions and MIPs were obtained to evaluate the vascular anatomy.  CONTRAST:  120mL OMNIPAQUE IOHEXOL 350 MG/ML SOLN  COMPARISON:  DG CHEST 1V PORT dated 09/19/2013  FINDINGS: Extensive bilateral pulmonary embolus with saddle emboli noted. Clot burden is heavy. Right ventricular to left ventricular diameter ratio abnormally high at 1.0.  AP window lymph node 0.9 cm in short axis. Right infrahilar lymph node 0.9 cm in short axis.  Patchy airspace opacities are present in the left lower lobe and left upper lobe due. There is some volume loss in the left lower lobe and atelectasis in the right lower lobe. Peripheral interstitial accentuation in the right middle lobe noted.  There appears to be a catheter in the thoracic spine.  Fatty liver noted.  Small hiatal hernia.  Review of the MIP images confirms the above findings.  IMPRESSION: 1. Extensive bilateral pulmonary embolus with heavy clot burden and evidence of right heart strain, compatible with massive pulmonary embolus. 2. Bibasilar atelectasis, with patchy airspace opacities in the left upper lobe and left lower lobe which may reflect pulmonary hemorrhage or early  infarct. 3. Fatty liver. 4. Small hiatal hernia.  Critical Value/emergent  results were called by telephone at the time of interpretation on 09/19/2013 at 5:05 PM to Dr. Sherwood Gambler , who verbally acknowledged these results.   Electronically Signed   By: Sherryl Barters M.D.   On: 09/19/2013 17:06   Dg Pelvis Portable  09/01/2013   CLINICAL DATA:  Status post fracture fixation.  EXAM: PORTABLE PELVIS 1-2 VIEWS  COMPARISON:  Plain films right femur 08/28/2013.  FINDINGS: The patient has a new dynamic hip screw and long intramedullary nail with a single distal interlocking screw for fixation of an inter and subtrochanteric fracture. A second screw is in place across the fracture. No new fracture is identified. Hardware is intact.  IMPRESSION: ORIF right inter and subtrochanteric fracture as described. No acute abnormality   Electronically Signed   By: Inge Rise M.D.   On: 09/01/2013 21:38   Ct Hip Right Wo Contrast  08/31/2013   CLINICAL DATA:  Right hip pain  EXAM: CT OF THE RIGHT HIP WITHOUT CONTRAST  TECHNIQUE: Multidetector CT imaging was performed according to the standard protocol. Multiplanar CT image reconstructions were also generated.  COMPARISON:  DG FEMUR*R* dated 08/28/2013  FINDINGS: There is a comminuted right intertrochanteric fracture with mild displacement. 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 There is a large joint effusion.  There is a large hematoma measuring approximately 9.1 x 3.1 cm in greatest axial dimension and 7.6 cm in greatest craniocaudal dimension. There is a skin defect involving the posterior medial upper thigh with a small hematoma within the subcutaneous fat adjacent to the skin defect with the hematoma measuring approximately 2.8 x 5.2 cm. There is generalized subcutaneous edema along the anterior upper thigh.  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  There are mild degenerative changes of the right SI joint.   There is a drainage catheter within the subcutaneous fat of the right lower abdomen with a 2.4 x 4.6 cm high attenuation area surrounding the catheter. There is no fluid present within this area.  IMPRESSION: 1. Comminuted right intertrochanteric fracture with mild displacement. No dislocation. 2. Large right adductor hematoma measuring 9.1 x 3.1 x 7.6 cm. There is a skin defect involving the posterior medial upper thigh with an underlying small 2.8 x 5.2 cm hematoma. 3. Drainage catheter within the subcutaneous fat of the right lower abdomen with a 2.4 x 4.6 cm high attenuation area surrounding the catheter which may represent blood products.  4. Large right hip joint effusion which may be reactive. If there is concern regarding infection, recommend arthrocentesis.   Electronically Signed   By: Kathreen Devoid   On: 08/31/2013 15:23   Dg Chest Port 1 View  09/20/2013   CLINICAL DATA:  Assess atelectasis, history hypertension, paraplegia  EXAM: PORTABLE CHEST - 1 VIEW  COMPARISON:  Portable exam 0517 hr compared to 09/19/2013  FINDINGS: Persistent elevation of right diaphragm.  Upper normal heart size.  Normal mediastinal contours and pulmonary vascularity.  Mild right basilar atelectasis.  Remaining lungs grossly clear.  No definite pleural effusion or pneumothorax.  IMPRESSION: Persistent elevation of right diaphragm with right basilar atelectasis.   Electronically Signed   By: Lavonia Dana M.D.   On: 09/20/2013 08:15   Dg Chest Portable 1 View  09/19/2013   CLINICAL DATA:  58 year old male shortness of breath weakness. Initial encounter.  EXAM: PORTABLE CHEST - 1 VIEW  COMPARISON:  08/29/2013 and earlier.  FINDINGS: Portable AP semi upright view at 1434 hrs. Mildly lower lung volumes. Elevation of the right hemidiaphragm unchanged. Stable cardiac size and mediastinal contours. Visualized tracheal air column is within normal limits. No pneumothorax. No pleural effusion or consolidation. Patchy lung base opacity  has not significantly changed and most resembles atelectasis.  IMPRESSION: Low lung volumes. Patchy lung base opacity not significantly changed and most resembles atelectasis.   Electronically Signed   By: Lars Pinks M.D.   On: 09/19/2013 14:52   Dg Chest Portable 1 View  08/29/2013   CLINICAL DATA:  Fall with hip and knee pain.  EXAM: PORTABLE CHEST - 1 VIEW  COMPARISON:  Chest x-ray from yesterday.  FINDINGS: Chronic elevation of the right diaphragm. Widening of the lower mediastinum which may represent hiatal hernia given surgical changes in the region. Chronic cardiomegaly. No edema, effusion, or pneumothorax.  IMPRESSION: Stable exam.  No edema or definite pneumonia.   Electronically Signed   By: Jorje Guild M.D.   On: 08/29/2013 06:30   Dg Shoulder Left  08/28/2013   CLINICAL DATA:  Fall.  Right shoulder pain.  EXAM: LEFT SHOULDER - 2+ VIEW  COMPARISON:  None.  FINDINGS: No acute bony or joint abnormality is identified. Degenerative disease about the right shoulder is seen. Image right lung and ribs are unremarkable.  IMPRESSION: No acute finding.   Electronically Signed   By: Inge Rise M.D.   On: 08/28/2013 23:17    Medications: Scheduled Meds: . atorvastatin  10 mg Oral QHS  . baclofen  20 mg Oral 3 times per day  . clonazePAM  0.5 mg Oral BID  . DULoxetine  30 mg Oral QHS  . enoxaparin (LOVENOX) injection  100 mg Subcutaneous BID  . fentaNYL  75 mcg Transdermal Q72H  . insulin aspart  0-9 Units Subcutaneous 6 times per day  . pantoprazole  40 mg Oral 2 times per day  . QUEtiapine  50 mg Oral QHS  . warfarin  10 mg Oral ONCE-1800  . Warfarin - Pharmacist Dosing Inpatient   Does not apply q1800      LOS: 2 days   Louis Gaw M.D. [facility] 09/21/2013, 3:02 PM Pager: [phone]  If 7PM-7AM, please contact night-coverage [email or web address] Password [email or web address]

## 2013-09-21 NOTE — Discharge Instructions (Signed)
Information on my medicine - Coumadin   (Warfarin)  This medication education was reviewed with me or my healthcare representative as part of my discharge preparation.  The pharmacist that spoke with me during my hospital stay was:  Georgina Peer, [facility]  Why was Coumadin prescribed for you? Coumadin was prescribed for you because you have a blood clot or a medical condition that can cause an increased risk of forming blood clots. Blood clots can cause serious health problems by blocking the flow of blood to the heart, lung, or brain. Coumadin can prevent harmful blood clots from forming. As a reminder your indication for Coumadin is:   Pulmonary Embolism Treatment  What test will check on my response to Coumadin? While on Coumadin (warfarin) you will need to have an INR test regularly to ensure that your dose is keeping you in the desired range. The INR (international normalized ratio) number is calculated from the result of the laboratory test called prothrombin time (PT).  If an INR APPOINTMENT HAS NOT ALREADY BEEN MADE FOR YOU please schedule an appointment to have this lab work done by your health care provider within 7 days. Your INR goal is usually a number between:  2 to 3.  Ask your health care provider during an office visit what your goal INR is.  What  do you need to  know  About  COUMADIN? Take Coumadin (warfarin) exactly as prescribed by your healthcare provider about the same time each day.  DO NOT stop taking without talking to the doctor who prescribed the medication.  Stopping without other blood clot prevention medication to take the place of Coumadin may increase your risk of developing a new clot or stroke.  Get refills before you run out.  What do you do if you miss a dose? If you miss a dose, take it as soon as you remember on the same day then continue your regularly scheduled regimen the next day.  Do not take two doses of Coumadin at the same time.  Important Safety  Information A possible side effect of Coumadin (Warfarin) is an increased risk of bleeding. You should call your healthcare provider right away if you experience any of the following:   Bleeding from an injury or your nose that does not stop.   Unusual colored urine (red or dark brown) or unusual colored stools (red or black).   Unusual bruising for unknown reasons.   A serious fall or if you hit your head (even if there is no bleeding).  Some foods or medicines interact with Coumadin (warfarin) and might alter your response to warfarin. To help avoid this:   Eat a balanced diet, maintaining a consistent amount of Vitamin K.   Notify your provider about major diet changes you plan to make.   Avoid alcohol or limit your intake to 1 drink for women and 2 drinks for men per day. (1 drink is 5 oz. wine, 12 oz. beer, or 1.5 oz. liquor.)  Make sure that ANY health care provider who prescribes medication for you knows that you are taking Coumadin (warfarin).  Also make sure the healthcare provider who is monitoring your Coumadin knows when you have started a new medication including herbals and non-prescription products.  Coumadin (Warfarin)  Major Drug Interactions  Increased Warfarin Effect Decreased Warfarin Effect  Alcohol (large quantities) Antibiotics (esp. Septra/Bactrim, Flagyl, Cipro) Amiodarone (Cordarone) Aspirin (ASA) Cimetidine (Tagamet) Megestrol (Megace) NSAIDs (ibuprofen, naproxen, etc.) Piroxicam (Feldene) Propafenone (Rythmol SR) Propranolol (  Inderal) °Isoniazid (INH) °Posaconazole (Noxafil) Barbiturates (Phenobarbital) °Carbamazepine (Tegretol) °Chlordiazepoxide (Librium) °Cholestyramine (Questran) °Griseofulvin °Oral Contraceptives °Rifampin °Sucralfate (Carafate) °Vitamin K  ° °Coumadin® (Warfarin) Major Herbal Interactions  °Increased Warfarin Effect Decreased Warfarin Effect  °Garlic °Ginseng °Ginkgo biloba Coenzyme Q10 °Green tea °St. John’s wort   ° °Coumadin® (Warfarin)  FOOD Interactions  °Eat a consistent number of servings per week of foods HIGH in Vitamin K °(1 serving = ½ cup)  °Collards (cooked, or boiled & drained) °Kale (cooked, or boiled & drained) °Mustard greens (cooked, or boiled & drained) °Parsley *serving size only = ¼ cup °Spinach (cooked, or boiled & drained) °Swiss chard (cooked, or boiled & drained) °Turnip greens (cooked, or boiled & drained)  °Eat a consistent number of servings per week of foods MEDIUM-HIGH in Vitamin K °(1 serving = 1 cup)  °Asparagus (cooked, or boiled & drained) °Broccoli (cooked, boiled & drained, or raw & chopped) °Brussel sprouts (cooked, or boiled & drained) *serving size only = ½ cup °Lettuce, raw (green leaf, endive, romaine) °Spinach, raw °Turnip greens, raw & chopped  ° °These websites have more information on Coumadin (warfarin):  www.coumadin.com; °www.ahrq.gov/consumer/coumadin.htm; ° ° ° °

## 2013-09-21 NOTE — Progress Notes (Addendum)
[place] for heparin>>lovenox and warfarin  Indication: PE  No Known Allergies  Patient Measurements: Height: 5\' 4"  (162.6 cm) Weight: 229 lb 4 oz (103.988 kg) IBW/kg (Calculated) : 59.2 Heparin Dosing Weight: 85kg  Vital Signs: Temp: 98.4 F (36.9 C) (02/25 0433) Temp src: Oral (02/25 0433) BP: 130/67 mmHg (02/25 0433) Pulse Rate: 63 (02/25 0433)  Labs:  Recent Labs  09/19/13 1415 09/19/13 1725 09/20/13 0014 09/20/13 0019  09/20/13 1320 09/20/13 2218 09/21/13 0730  HGB 11.7*  --  11.6*  --   --   --   --  11.1*  HCT 37.7*  --  36.5*  --   --   --   --  35.1*  PLT 307  --  282  --   --   --   --  269  APTT  --  34  --   --   --   --   --   --   LABPROT  --  14.9  --   --   --   --   --  16.3*  INR  --  1.20  --   --   --   --   --  1.34  HEPARINUNFRC  --   --   --  0.24*  0.24*  < > 0.20* <0.10* 0.38  CREATININE 0.71  --   --  0.72  --   --   --   --   < > = values in this interval not displayed.  Estimated Creatinine Clearance: 112.4 ml/min (by C-G formula based on Cr of 0.72).  Assessment: 58 yo male with PE continues on heparin. Level now at goal this morning at 0.38, INR trending up to 1.3. No bleeding issues noted, this is day #2 of VTE overlap.   New orders received to transition to sq lovenox. Renal function appropriate for bid dosing.  Goal of Therapy:  Heparin level 0.3-0.7 units/ml Monitor platelets by anticoagulation protocol: Yes   Plan:  Heparin at 2000 units/hr>>lovenox 100mg  sq q 12 hours Warfarin 10mg  tonight CBC q 72 hours Daily INR Follow for s/s of bleeding  Erin Hearing PharmD., BCPS Clinical Pharmacist Pager [phone] 09/21/2013 8:42 AM
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
## 2013-09-21 NOTE — Consult Note (Signed)
WOC wound consult note Reason for Consult: Consult requested for posterior perineal wound. Pt has been followed as an outpatient by [facility] wound care to promote healing for this chronic wound. Wound type: Stage 3 Pressure Ulcer POA: Yes Measurement: 2X1X.2cm Wound bed: 30% yellow, 70% red Drainage (amount, consistency, odor) mod amt light green drainage Periwound: intact skin surrounding Dressing procedure/placement/frequency: Pt has been using Santyl to chemically debride nonviable tissue prior to admission. Foam dressing to protect from friction/shear. Continue with current plan of care.  He plans to follow-up with [facility] plastics team after discharge from hospital.  He appears to be well-informed regarding topical treatment of wound. Please re-consult if further assistance is needed.  Thank-you,  Julien Girt MSN, [place], [place], [place], [place]

## 2013-09-22 ENCOUNTER — Inpatient Hospital Stay (HOSPITAL_COMMUNITY): Payer: PRIVATE HEALTH INSURANCE

## 2013-09-22 LAB — HEMOGLOBIN A1C
Hgb A1c MFr Bld: 5.2 % (ref <5.7)
MEAN PLASMA GLUCOSE: 103 mg/dL (ref <117)

## 2013-09-22 LAB — CBC
HCT: 35 % — ABNORMAL LOW (ref 39.0–52.0)
Hemoglobin: 11.2 g/dL — ABNORMAL LOW (ref 13.0–17.0)
MCH: 27.9 pg (ref 26.0–34.0)
MCHC: 32 g/dL (ref 30.0–36.0)
MCV: 87.1 fL (ref 78.0–100.0)
Platelets: 273 10*3/uL (ref 150–400)
RBC: 4.02 MIL/uL — ABNORMAL LOW (ref 4.22–5.81)
RDW: 16.7 % — ABNORMAL HIGH (ref 11.5–15.5)
WBC: 5.1 10*3/uL (ref 4.0–10.5)

## 2013-09-22 LAB — GLUCOSE, CAPILLARY
GLUCOSE-CAPILLARY: 89 mg/dL (ref 70–99)
GLUCOSE-CAPILLARY: 94 mg/dL (ref 70–99)
GLUCOSE-CAPILLARY: 91 mg/dL (ref 70–99)
GLUCOSE-CAPILLARY: 112 mg/dL — AB (ref 70–99)
Glucose-Capillary: 111 mg/dL — ABNORMAL HIGH (ref 70–99)

## 2013-09-22 LAB — PROTIME-INR
INR: 2.67 — ABNORMAL HIGH (ref 0.00–1.49)
Prothrombin Time: 27.5 seconds — ABNORMAL HIGH (ref 11.6–15.2)

## 2013-09-22 MED ORDER — INSULIN ASPART 100 UNIT/ML ~~LOC~~ SOLN: 0.0000 [IU] | Freq: Every day | SUBCUTANEOUS | Status: DC

## 2013-09-22 MED ORDER — FENTANYL CITRATE 0.05 MG/ML IJ SOLN
INTRAMUSCULAR | Status: AC
  Filled 2013-09-22: qty 4

## 2013-09-22 MED ORDER — LORAZEPAM 1 MG PO TABS
1.0000 mg | ORAL_TABLET | Freq: Three times a day (TID) | ORAL | Status: DC | PRN
  Administered 2013-09-22 – 2013-09-23 (×2): 1 mg via ORAL
  Filled 2013-09-22 (×2): qty 1

## 2013-09-22 MED ORDER — ONDANSETRON HCL 4 MG/2ML IJ SOLN
INTRAMUSCULAR | Status: AC
  Filled 2013-09-22: qty 2

## 2013-09-22 MED ORDER — INSULIN ASPART 100 UNIT/ML ~~LOC~~ SOLN: 0.0000 [IU] | Freq: Three times a day (TID) | SUBCUTANEOUS | Status: DC

## 2013-09-22 MED ORDER — ONDANSETRON HCL 4 MG/2ML IJ SOLN
4.0000 mg | Freq: Once | INTRAMUSCULAR | Status: AC
  Administered 2013-09-22: 4 mg via INTRAVENOUS

## 2013-09-22 MED ORDER — IOHEXOL 300 MG/ML  SOLN
100.0000 mL | Freq: Once | INTRAMUSCULAR | Status: AC | PRN
  Administered 2013-09-22: 50 mL via INTRAVENOUS

## 2013-09-22 MED ORDER — MIDAZOLAM HCL 2 MG/2ML IJ SOLN
INTRAMUSCULAR | Status: AC
  Filled 2013-09-22: qty 4

## 2013-09-22 MED ORDER — FENTANYL CITRATE 0.05 MG/ML IJ SOLN
INTRAMUSCULAR | Status: AC | PRN
  Administered 2013-09-22 (×4): 50 ug via INTRAVENOUS

## 2013-09-22 MED ORDER — MIDAZOLAM HCL 2 MG/2ML IJ SOLN
INTRAMUSCULAR | Status: AC | PRN
  Administered 2013-09-22 (×4): 1 mg via INTRAVENOUS

## 2013-09-22 NOTE — Sedation Documentation (Signed)
Patient denies pain and is resting comfortably.  

## 2013-09-22 NOTE — Progress Notes (Signed)
Patient ID: Samuel [name]  male  [id number]    DOB: 05-May-1956    DOA: 09/19/2013  PCP: [name], MD  Assessment/Plan: Principal Problem:   Acute pulmonary embolism, bilateral, acute right leg DVT - Currently on heparin drip (started on 09/19/2013), Coumadin started 2/24  - CT chest showed extensive bilateral pulmonary embolus with heavy clot burden and evidence of right heart strain compatible with massive pulmonary embolus - on therapeutic Lovenox and continue Coumadin, INR therapeutic, Coumadin per pharmacy - 2-D echo done which showed normal LV function normal right ventricular systolic function - Per pulmonology, consider lifelong Coumadin given immobility, paraplegia and clot burden, recommended IVC filter placement in this patient with no likely in functional status. - IVC filter placed today   Active Problems:   HTN (hypertension) - Currently stable    right Hip fracture  - s/p IM nailing   - d/w Dr Marlou Sa (Ortho) on 2/25, will see patient     DVT of lower extremity (deep venous thrombosis) right:  - as #1  Decubitus ulcer, stage 4  - wound care consult placed  Protein calorie malnutrition (severe) and hypoalbuminemia:  - Continue prostat    DVT Prophylaxis:  Code Status:  Family Communication:  Disposition:  Consultants:  PCCM  Ortho (Dr Marlou Sa)  Procedures:  CT angio chest  Antibiotics:  None    Subjective:  Feeling nauseous today seen after the IVC filter placement. No chest pain or shortness of breath, fevers or chills  Objective: Weight change: 5.011 kg (11 lb 0.8 oz)  Intake/Output Summary (Last 24 hours) at 09/22/13 1454 Last data filed at 09/22/13 0129  Gross per 24 hour  Intake      0 ml  Output   3200 ml  Net  -3200 ml   Blood pressure 127/83, pulse 84, temperature 97.8 F (36.6 C), temperature source Oral, resp. rate 12, height 5\' 4"  (1.626 m), weight 103.556 kg (228 lb 4.8 oz), SpO2 97.00%.  Physical  Exam: General: Alert and awake, oriented x3, not in any acute distress. 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 Neuro: paraplegia Back: Sacral wound and reviewed, no cellulitis changes or discharge, packing still present  Lab Results: Basic Metabolic Panel:  Recent Labs Lab 09/19/13 1415 09/20/13 0019  NA 138 140  K 4.3 4.1  CL 102 105  CO2 23 22  GLUCOSE 105* 94  BUN 7 8  CREATININE 0.71 0.72  CALCIUM 9.0 8.5  MG  --  2.2  PHOS  --  4.0   Liver Function Tests:  Recent Labs Lab 09/19/13 1415  AST 17  ALT 11  ALKPHOS 205*  BILITOT 0.6  PROT 7.4  ALBUMIN 3.4*   No results found for this basename: LIPASE, AMYLASE,  in the last 168 hours No results found for this basename: AMMONIA,  in the last 168 hours CBC:  Recent Labs Lab 09/19/13 1415  09/21/13 0730 09/22/13 0350  WBC 11.9*  < > 7.0 5.1  NEUTROABS 9.6*  --   --   --   HGB 11.7*  < > 11.1* 11.2*  HCT 37.7*  < > 35.1* 35.0*  MCV 88.7  < > 88.0 87.1  PLT 307  < > 269 273  < > = values in this interval not displayed. Cardiac Enzymes: No results found for this basename: CKTOTAL, CKMB,  CKMBINDEX, TROPONINI,  in the last 168 hours BNP: No components found with this basename: POCBNP,  CBG:  Recent Labs Lab 09/21/13 2025 09/21/13 2313 09/22/13 0314 09/22/13 0812 09/22/13 1230  GLUCAP 96 106* 89 111* 94     Micro Results: Recent Results (from the past 240 hour(s))  CULTURE, BLOOD (ROUTINE X 2)     Status: None   Collection Time    09/19/13  2:15 PM      Result Value Ref Range Status   Specimen Description BLOOD LEFT ARM   Final   Special Requests BOTTLES DRAWN AEROBIC AND ANAEROBIC 2.5CC   Final   Culture  Setup Time     Final   Value: 09/19/2013 19:58     Performed at [facility]   Culture     Final    Value:        BLOOD CULTURE RECEIVED NO GROWTH TO DATE CULTURE WILL BE HELD FOR 5 DAYS BEFORE ISSUING A FINAL NEGATIVE REPORT     Performed at [facility]   Report Status PENDING   Incomplete  CULTURE, BLOOD (ROUTINE X 2)     Status: None   Collection Time    09/19/13  3:33 PM      Result Value Ref Range Status   Specimen Description BLOOD RIGHT HAND   Final   Special Requests BOTTLES DRAWN AEROBIC AND ANAEROBIC 8ML   Final   Culture  Setup Time     Final   Value: 09/19/2013 19:59     Performed at [facility]   Culture     Final   Value:        BLOOD CULTURE RECEIVED NO GROWTH TO DATE CULTURE WILL BE HELD FOR 5 DAYS BEFORE ISSUING A FINAL NEGATIVE REPORT     Performed at [facility]   Report Status PENDING   Incomplete  MRSA PCR SCREENING     Status: None   Collection Time    09/19/13  7:29 PM      Result Value Ref Range Status   MRSA by PCR NEGATIVE  NEGATIVE Final   Comment:            The GeneXpert MRSA Assay (FDA     approved for NASAL specimens     only), is one component of a     comprehensive MRSA colonization     surveillance program. It is not     intended to diagnose MRSA     infection nor to guide or     monitor treatment for     MRSA infections.    Studies/Results: Dg Chest 1 View  08/28/2013   CLINICAL DATA:  Fall.  EXAM: CHEST - 1 VIEW  COMPARISON:  Single view of the chest 03/01/2013.  FINDINGS: There is unchanged elevation of the right hemidiaphragm. Lungs appear clear. Heart size is upper normal. No pneumothorax or pleural effusion. Surgical clips in the left upper quadrant the abdomen are noted.  IMPRESSION: No acute finding.   Electronically Signed   By: Inge Rise M.D.   On: 08/28/2013 23:19   Dg Pelvis 1-2 Views  08/28/2013   CLINICAL DATA:  Fall.  Pain.  EXAM: PELVIS - 1-2 VIEW  COMPARISON:  None.  FINDINGS: There is partial visualization of a right intertrochanteric fracture. No other acute bony or joint abnormality is  identified. Osteolysis of the femoral heads is worse on the left. There is destructive change of the left inferior pubic  ramus with an appearance most compatible with chronic osteomyelitis.  IMPRESSION: Partial visualization of a right intertrochanteric fracture.  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   Electronically Signed   By: Inge Rise M.D.   On: 08/28/2013 23:18   Dg Hip Operative Right  09/01/2013   CLINICAL DATA:  58 year old male undergoing repair of right hip fracture. Initial encounter.  EXAM: DG OPERATIVE RIGHT HIP  TECHNIQUE: A single spot fluoroscopic AP image of the right hip is submitted.  COMPARISON:  Preoperative film 08/28/2013.  FLUOROSCOPY TIME:  4 min 30 seconds.  FINDINGS: Four intraoperative coned-down fluoroscopic views of the right femur. Abnormal proximal right femur from the head to the intertrochanteric region with comminuted fracture again noted. Fragments traversed by intra medullary rod with proximal dual interlocking dynamic hip screws. Distal interlocking cortical screw placed.  IMPRESSION: ORIF right proximal femur.   Electronically Signed   By: Lars Pinks M.D.   On: 09/01/2013 19:59   Dg Femur Right  08/28/2013   CLINICAL DATA:  Right leg pain in a paraplegic patient.  EXAM: RIGHT FEMUR - 2 VIEW  COMPARISON:  None.  FINDINGS: The patient has an intertrochanteric right hip fracture with subtrochanteric extension. There is osteolysis of the right femoral head with remodeling of the right acetabulum consistent with chronic change. Bones appear osteopenic. There is fixation of a remote healed distal femoral fracture. Advanced for age degenerative disease about the right knee is partially visualized.  IMPRESSION: Right intertrochanteric fracture with subtrochanteric extension.  Chronic appearing osteolysis and remodeling of the right hip and acetabulum.  Remote healed distal right femur  fracture with fixation hardware in place.   Electronically Signed   By: Inge Rise M.D.   On: 08/28/2013 23:15   Dg Tibia/fibula Right  08/28/2013   CLINICAL DATA:  Status post fall.  Right lower leg pain.  EXAM: RIGHT TIBIA AND FIBULA - 2 VIEW  COMPARISON:  MRI right knee 12/31/2012.  FINDINGS: The patient has remote healed fractures of the distal right femur, proximal right tibia and fibula and distal right tibia and fibula. No acute bony or joint abnormality is identified. Bones are markedly osteopenic.  IMPRESSION: No acute finding.  Multiple remote fractures as above.   Electronically Signed   By: Inge Rise M.D.   On: 08/28/2013 23:16   Ct Angio Chest Pe W/cm &/or Wo Cm  09/19/2013   CLINICAL DATA:  Shortness of breath.  Hypoxia.  EXAM: CT ANGIOGRAPHY CHEST WITH CONTRAST  TECHNIQUE: Multidetector CT imaging of the chest was performed using the standard protocol during bolus administration of intravenous contrast. Multiplanar CT image reconstructions and MIPs were obtained to evaluate the vascular anatomy.  CONTRAST:  147mL OMNIPAQUE IOHEXOL 350 MG/ML SOLN  COMPARISON:  DG CHEST 1V PORT dated 09/19/2013  FINDINGS: Extensive bilateral pulmonary embolus with saddle emboli noted. Clot burden is heavy. Right ventricular to left ventricular diameter ratio abnormally high at 1.0.  AP window lymph node 0.9 cm in short axis. Right infrahilar lymph node 0.9 cm in short axis.  Patchy airspace opacities are present in the left lower lobe and left upper lobe due. There is some volume loss in the left lower lobe and atelectasis in the right lower lobe. Peripheral interstitial accentuation in the right middle lobe noted.  There appears to be a catheter in the thoracic spine.  Fatty liver noted.  Small hiatal hernia.  Review of the  MIP images confirms the above findings.  IMPRESSION: 1. Extensive bilateral pulmonary embolus with heavy clot burden and evidence of right heart strain, compatible with massive  pulmonary embolus. 2. Bibasilar atelectasis, with patchy airspace opacities in the left upper lobe and left lower lobe which may reflect pulmonary hemorrhage or early infarct. 3. Fatty liver. 4. Small hiatal hernia.  Critical Value/emergent results were called by telephone at the time of interpretation on 09/19/2013 at 5:05 PM to Dr. Sherwood Gambler , who verbally acknowledged these results.   Electronically Signed   By: Sherryl Barters M.D.   On: 09/19/2013 17:06   Dg Pelvis Portable  09/01/2013   CLINICAL DATA:  Status post fracture fixation.  EXAM: PORTABLE PELVIS 1-2 VIEWS  COMPARISON:  Plain films right femur 08/28/2013.  FINDINGS: The patient has a new dynamic hip screw and long intramedullary nail with a single distal interlocking screw for fixation of an inter and subtrochanteric fracture. A second screw is in place across the fracture. No new fracture is identified. Hardware is intact.  IMPRESSION: ORIF right inter and subtrochanteric fracture as described. No acute abnormality   Electronically Signed   By: Inge Rise M.D.   On: 09/01/2013 21:38   Ct Hip Right Wo Contrast  08/31/2013   CLINICAL DATA:  Right hip pain  EXAM: CT OF THE RIGHT HIP WITHOUT CONTRAST  TECHNIQUE: Multidetector CT imaging was performed according to the standard protocol. Multiplanar CT image reconstructions were also generated.  COMPARISON:  DG FEMUR*R* dated 08/28/2013  FINDINGS: There is a comminuted right intertrochanteric fracture with mild displacement. 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 There is a large joint effusion.  There is a large hematoma measuring approximately 9.1 x 3.1 cm in greatest axial dimension and 7.6 cm in greatest craniocaudal dimension. There is a skin defect involving the posterior medial upper thigh with a small hematoma within the subcutaneous fat adjacent to the skin defect  with the hematoma measuring approximately 2.8 x 5.2 cm. There is generalized subcutaneous edema along the anterior upper thigh.  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  There are mild degenerative changes of the right SI joint.  There is a drainage catheter within the subcutaneous fat of the right lower abdomen with a 2.4 x 4.6 cm high attenuation area surrounding the catheter. There is no fluid present within this area.  IMPRESSION: 1. Comminuted right intertrochanteric fracture with mild displacement. No dislocation. 2. Large right adductor hematoma measuring 9.1 x 3.1 x 7.6 cm. There is a skin defect involving the posterior medial upper thigh with an underlying small 2.8 x 5.2 cm hematoma. 3. Drainage catheter within the subcutaneous fat of the right lower abdomen with a 2.4 x 4.6 cm high attenuation area surrounding the catheter which may represent blood products.  4. Large right hip joint effusion which may be reactive. If there is concern regarding infection, recommend arthrocentesis.   Electronically Signed   By: Kathreen Devoid   On: 08/31/2013 15:23   Dg Chest Port 1 View  09/20/2013   CLINICAL DATA:  Assess atelectasis, history hypertension, paraplegia  EXAM: PORTABLE CHEST - 1 VIEW  COMPARISON:  Portable exam 0517 hr compared to 09/19/2013  FINDINGS: Persistent elevation of right diaphragm.  Upper normal heart size.  Normal mediastinal contours and pulmonary vascularity.  Mild right basilar atelectasis.  Remaining lungs grossly clear.  No definite pleural effusion  or pneumothorax.  IMPRESSION: Persistent elevation of right diaphragm with right basilar atelectasis.   Electronically Signed   By: Lavonia Dana M.D.   On: 09/20/2013 08:15   Dg Chest Portable 1 View  09/19/2013   CLINICAL DATA:  58 year old male shortness of breath weakness. Initial encounter.  EXAM: PORTABLE CHEST - 1 VIEW  COMPARISON:  08/29/2013 and earlier.  FINDINGS: Portable AP semi upright view at 1434 hrs. Mildly lower  lung volumes. Elevation of the right hemidiaphragm unchanged. Stable cardiac size and mediastinal contours. Visualized tracheal air column is within normal limits. No pneumothorax. No pleural effusion or consolidation. Patchy lung base opacity has not significantly changed and most resembles atelectasis.  IMPRESSION: Low lung volumes. Patchy lung base opacity not significantly changed and most resembles atelectasis.   Electronically Signed   By: Lars Pinks M.D.   On: 09/19/2013 14:52   Dg Chest Portable 1 View  08/29/2013   CLINICAL DATA:  Fall with hip and knee pain.  EXAM: PORTABLE CHEST - 1 VIEW  COMPARISON:  Chest x-ray from yesterday.  FINDINGS: Chronic elevation of the right diaphragm. Widening of the lower mediastinum which may represent hiatal hernia given surgical changes in the region. Chronic cardiomegaly. No edema, effusion, or pneumothorax.  IMPRESSION: Stable exam.  No edema or definite pneumonia.   Electronically Signed   By: Jorje Guild M.D.   On: 08/29/2013 06:30   Dg Shoulder Left  08/28/2013   CLINICAL DATA:  Fall.  Right shoulder pain.  EXAM: LEFT SHOULDER - 2+ VIEW  COMPARISON:  None.  FINDINGS: No acute bony or joint abnormality is identified. Degenerative disease about the right shoulder is seen. Image right lung and ribs are unremarkable.  IMPRESSION: No acute finding.   Electronically Signed   By: Inge Rise M.D.   On: 08/28/2013 23:17    Medications: Scheduled Meds: . atorvastatin  10 mg Oral QHS  . baclofen  20 mg Oral 3 times per day  . clonazePAM  0.5 mg Oral BID  . collagenase   Topical Daily  . DULoxetine  30 mg Oral QHS  . enoxaparin (LOVENOX) injection  100 mg Subcutaneous BID  . fentaNYL  75 mcg Transdermal Q72H  . fentaNYL      . insulin aspart  0-9 Units Subcutaneous 6 times per day  . midazolam      . ondansetron      . pantoprazole  40 mg Oral 2 times per day  . QUEtiapine  50 mg Oral QHS  . Warfarin - Pharmacist Dosing Inpatient   Does not apply  q1800      LOS: 3 days   RAI,RIPUDEEP M.D. [facility] 09/22/2013, 2:54 PM Pager: [phone]  If 7PM-7AM, please contact night-coverage [email or web address] Password [email or web address]

## 2013-09-22 NOTE — Progress Notes (Signed)
Events noted Incisions intact - staples ok to be removed - decreased leg pain with ROM right hip Plan f/u 4 weeks Ok to roll  For wound care - ok to sit up in bed and chair - would not wb on the right leg yet

## 2013-09-22 NOTE — Progress Notes (Signed)
[place] for lovenox and warfarin  Indication: PE  No Known Allergies  Patient Measurements: Height: 5\' 4"  (162.6 cm) Weight: 228 lb 4.8 oz (103.556 kg) IBW/kg (Calculated) : 59.2 Heparin Dosing Weight: 85kg  Vital Signs: Temp: 97.8 F (36.6 C) (02/26 0545) Temp src: Oral (02/26 0545) BP: 150/82 mmHg (02/26 0846) Pulse Rate: 85 (02/26 0846)  Labs:  Recent Labs  09/19/13 1415 09/19/13 1725 09/20/13 0014 09/20/13 0019  09/20/13 1320 09/20/13 2218 09/21/13 0730 09/22/13 0350 09/22/13 0530  HGB 11.7*  --  11.6*  --   --   --   --  11.1* 11.2*  --   HCT 37.7*  --  36.5*  --   --   --   --  35.1* 35.0*  --   PLT 307  --  282  --   --   --   --  269 273  --   APTT  --  34  --   --   --   --   --   --   --   --   LABPROT  --  14.9  --   --   --   --   --  16.3*  --  27.5*  INR  --  1.20  --   --   --   --   --  1.34  --  2.67*  HEPARINUNFRC  --   --   --  0.24*  0.24*  < > 0.20* <0.10* 0.38  --   --   CREATININE 0.71  --   --  0.72  --   --   --   --   --   --   < > = values in this interval not displayed.  Estimated Creatinine Clearance: 112.3 ml/min (by C-G formula based on Cr of 0.72).  Assessment: 58 yo male with PE on lovenox and warfarin - day # 3 of 5 day overlap. Plan for IVC filter.   INR rose sharply from 1.34 to 2.67 after 2 doses of warfarin 10mg .  H/H remains low but stable. Platelets are stable at 273. No bleeding reported.   Goal of Therapy:  Heparin level 0.3-0.7 units/ml Monitor platelets by anticoagulation protocol: Yes   Plan:  Continue Lovenox 100mg  sq q 12 hours No Warfarin tonight due to sharp rise in INR.  CBC q 72 hours Daily INR Follow for s/s of bleeding  Sloan Leiter, PharmD, BCPS Clinical Pharmacist [phone] 09/22/2013 9:01 AM

## 2013-09-22 NOTE — Progress Notes (Signed)
Staples on the right hip removed with no complications( 18 staples ). Pt tolerated procedure well. Dry dressing with ABD applied. Will continue to monitor.  Ferdinand Lango, RN

## 2013-09-22 NOTE — Procedures (Signed)
Placement of IVC filter.  Filter is positioned below renal veins and no immediate complication.  See Radiology report.

## 2013-09-23 LAB — GLUCOSE, CAPILLARY
GLUCOSE-CAPILLARY: 84 mg/dL (ref 70–99)
GLUCOSE-CAPILLARY: 88 mg/dL (ref 70–99)
Glucose-Capillary: 89 mg/dL (ref 70–99)
Glucose-Capillary: 101 mg/dL — ABNORMAL HIGH (ref 70–99)

## 2013-09-23 LAB — PROTIME-INR
INR: 3.81 — ABNORMAL HIGH (ref 0.00–1.49)
Prothrombin Time: 36.1 seconds — ABNORMAL HIGH (ref 11.6–15.2)

## 2013-09-23 MED ORDER — ONDANSETRON HCL 4 MG/2ML IJ SOLN
4.0000 mg | Freq: Four times a day (QID) | INTRAMUSCULAR | Status: DC | PRN
  Administered 2013-09-23 – 2013-09-24 (×3): 4 mg via INTRAVENOUS
  Filled 2013-09-23 (×3): qty 2

## 2013-09-23 NOTE — Progress Notes (Signed)
[place] for lovenox and warfarin  Indication: PE  No Known Allergies  Patient Measurements: Height: 5\' 4"  (162.6 cm) Weight: 225 lb 1.4 oz (102.1 kg) IBW/kg (Calculated) : 59.2 Heparin Dosing Weight: 85kg  Vital Signs: Temp: 98 F (36.7 C) (02/27 0527) Temp src: Oral (02/27 0527) BP: 130/70 mmHg (02/27 0527) Pulse Rate: 65 (02/27 0527)  Labs:  Recent Labs  09/20/13 1320 09/20/13 2218 09/21/13 0730 09/22/13 0350 09/22/13 0530 09/23/13 0632  HGB  --   --  11.1* 11.2*  --   --   HCT  --   --  35.1* 35.0*  --   --   PLT  --   --  269 273  --   --   LABPROT  --   --  16.3*  --  27.5* 36.1*  INR  --   --  1.34  --  2.67* 3.81*  HEPARINUNFRC 0.20* <0.10* 0.38  --   --   --     Estimated Creatinine Clearance: 111.4 ml/min (by C-G formula based on Cr of 0.72).  Assessment: 58 yo male with PE on lovenox and warfarin - today is day # 4/5 day overlap. Planning for IVC filter.  INR rose sharply from 1.34 to 2.67 after 2 doses of warfarin 10mg . Warfarin was held yesterday and INR increased again to 3.81 this morning.  H/H remains low but stable. Platelets are stable at 273. No bleeding reported. Asked RN to let pharmacy know if any bleeding or bruising is noted.  Goal of Therapy:  INR 2-3 Heparin level 0.3-0.7 units/ml Monitor platelets by anticoagulation protocol: Yes   Plan:  1. Continue Lovenox 100mg  sq q 12 hours 2. No Warfarin tonight due to INR increase 3. CBC q 72 hours 4. Daily INR 5. Follow for s/s of bleeding  Charlise Giovanetti D. Kyleah [place], PharmD, BCPS Clinical Pharmacist Pager: [phone] 09/23/2013 10:32 AM

## 2013-09-23 NOTE — Progress Notes (Addendum)
CSW (Clinical Education officer, museum) confirmed with facility that pt can dc back over the weekend if medically stable. CSW will need to fax dc paperwork to [phone] and speak with DON Bobetta Lime.  ADDENDUNM 4:20pm: CSW spoke with DON Vicky at [facility]. She confirmed that pt can return over weekend if ready. Weekend CSW and nurse calling report should ask for "nurse on [address]".   [place], [place]

## 2013-09-23 NOTE — Progress Notes (Signed)
Patient ID: Samuel [name]  male  NGE:[id number]    DOB: [birth year]    DOA: 09/19/2013  PCP: [name], MD  Assessment/Plan: Principal Problem:   Acute pulmonary embolism, bilateral, acute right leg DVT - Currently on Lovenox, heparin drip discontinued, Coumadin started 2/24,held 2/26 INR is supratherapeutic 3.81 - CT chest showed extensive bilateral pulmonary embolus with heavy clot burden and evidence of right heart strain compatible with massive pulmonary embolus - 2-D echo done which showed normal LV function normal right ventricular systolic function - Per pulmonology, consider lifelong Coumadin given immobility, paraplegia and clot burden, recommended IVC filter placement in this patient with no likely in functional status. - IVC filter placed on 2/26  Active Problems:   HTN (hypertension) - Currently stable    right Hip fracture  - s/p IM nailing   - Seen by orthopedics, staples removed on 2/26, followup in 4 weeks - Per orthopedics instructions, Ok to roll For wound care - ok to sit up in bed and chair - would not weight bearing on the right leg yet     DVT of lower extremity (deep venous thrombosis) right:  - as #1  Decubitus ulcer, stage 4  - wound care   Protein calorie malnutrition (severe) and hypoalbuminemia:  - Continue prostat    DVT Prophylaxis:Therapeutic Lovenox, Coumadin on hold   Code Status:  Family Communication:  Disposition:Hopefully over the weekend to skilled nursing facility if INR is therapeutic and the Coumadin is restarted   Consultants:  PCCM  Ortho (Dr Marlou Sa)  Procedures:  CT angio chest  Antibiotics:  None    Subjective:  Feeling nauseous today  Objective: Weight change: -6.899 kg (-15 lb 3.4 oz)  Intake/Output Summary (Last 24 hours) at 09/23/13 1127 Last data filed at 09/23/13 0534  Gross per 24 hour  Intake   5225 ml  Output   1550 ml  Net   3675 ml   Blood pressure 130/70, pulse 65, temperature  98 F (36.7 C), temperature source Oral, resp. rate 18, height 5\' 4"  (1.626 m), weight 102.1 kg (225 lb 1.4 oz), SpO2 93.00%.  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 Neuro: paraplegia Back: Sacral wound and reviewed, no cellulitis changes or discharge, packing still present  Lab Results: Basic Metabolic Panel:  Recent Labs Lab 09/19/13 1415 09/20/13 0019  NA 138 140  K 4.3 4.1  CL 102 105  CO2 23 22  GLUCOSE 105* 94  BUN 7 8  CREATININE 0.71 0.72  CALCIUM 9.0 8.5  MG  --  2.2  PHOS  --  4.0   Liver Function Tests:  Recent Labs Lab 09/19/13 1415  AST 17  ALT 11  ALKPHOS 205*  BILITOT 0.6  PROT 7.4  ALBUMIN 3.4*   No results found for this basename: LIPASE, AMYLASE,  in the last 168 hours No results found for this basename: AMMONIA,  in the last 168 hours CBC:  Recent Labs Lab 09/19/13 1415  09/21/13 0730 09/22/13 0350  WBC 11.9*  < > 7.0 5.1  NEUTROABS 9.6*  --   --   --   HGB 11.7*  < > 11.1* 11.2*  HCT 37.7*  < > 35.1* 35.0*  MCV 88.7  < > 88.0 87.1  PLT 307  < > 269 273  < > = values in this interval  not displayed. Cardiac Enzymes: No results found for this basename: CKTOTAL, CKMB, CKMBINDEX, TROPONINI,  in the last 168 hours BNP: No components found with this basename: POCBNP,  CBG:  Recent Labs Lab 09/22/13 0812 09/22/13 1230 09/22/13 1632 09/22/13 2221 09/23/13 0757  GLUCAP 111* 94 91 112* 84     Micro Results: Recent Results (from the past 240 hour(s))  CULTURE, BLOOD (ROUTINE X 2)     Status: None   Collection Time    09/19/13  2:15 PM      Result Value Ref Range Status   Specimen Description BLOOD LEFT ARM   Final   Special Requests BOTTLES DRAWN AEROBIC AND ANAEROBIC 2.5CC   Final   Culture  Setup Time     Final   Value: 09/19/2013 19:58     Performed at [facility]   Culture     Final   Value:        BLOOD CULTURE RECEIVED NO GROWTH TO DATE CULTURE WILL BE HELD FOR 5 DAYS BEFORE ISSUING A FINAL NEGATIVE REPORT     Performed at [facility]   Report Status PENDING   Incomplete  CULTURE, BLOOD (ROUTINE X 2)     Status: None   Collection Time    09/19/13  3:33 PM      Result Value Ref Range Status   Specimen Description BLOOD RIGHT HAND   Final   Special Requests BOTTLES DRAWN AEROBIC AND ANAEROBIC 8ML   Final   Culture  Setup Time     Final   Value: 09/19/2013 19:59     Performed at [facility]   Culture     Final   Value:        BLOOD CULTURE RECEIVED NO GROWTH TO DATE CULTURE WILL BE HELD FOR 5 DAYS BEFORE ISSUING A FINAL NEGATIVE REPORT     Performed at [facility]   Report Status PENDING   Incomplete  MRSA PCR SCREENING     Status: None   Collection Time    09/19/13  7:29 PM      Result Value Ref Range Status   MRSA by PCR NEGATIVE  NEGATIVE Final   Comment:            The GeneXpert MRSA Assay (FDA     approved for NASAL specimens     only), is one component of a     comprehensive MRSA colonization     surveillance program. It is not     intended to diagnose MRSA     infection nor to guide or     monitor treatment for     MRSA infections.    Studies/Results: Dg Chest 1 View  08/28/2013   CLINICAL DATA:  Fall.  EXAM: CHEST - 1 VIEW  COMPARISON:  Single view of the chest 03/01/2013.  FINDINGS: There is unchanged elevation of the right hemidiaphragm. Lungs appear clear. Heart size is upper normal. No pneumothorax or pleural effusion. Surgical clips in the left upper quadrant the abdomen are noted.  IMPRESSION: No acute finding.   Electronically Signed   By: Inge Rise M.D.   On: 08/28/2013 23:19   Dg Pelvis 1-2 Views  08/28/2013   CLINICAL DATA:  Fall.  Pain.  EXAM: PELVIS - 1-2 VIEW  COMPARISON:  None.  FINDINGS: There is partial visualization of a right intertrochanteric fracture. No other acute  bony or joint abnormality is identified. Osteolysis of the femoral heads is worse  on the left. There is destructive change of the left inferior pubic ramus with an appearance most compatible with chronic osteomyelitis.  IMPRESSION: Partial visualization of a right intertrochanteric fracture.  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   Electronically Signed   By: Inge Rise M.D.   On: 08/28/2013 23:18   Dg Hip Operative Right  09/01/2013   CLINICAL DATA:  58 year old male undergoing repair of right hip fracture. Initial encounter.  EXAM: DG OPERATIVE RIGHT HIP  TECHNIQUE: A single spot fluoroscopic AP image of the right hip is submitted.  COMPARISON:  Preoperative film 08/28/2013.  FLUOROSCOPY TIME:  4 min 30 seconds.  FINDINGS: Four intraoperative coned-down fluoroscopic views of the right femur. Abnormal proximal right femur from the head to the intertrochanteric region with comminuted fracture again noted. Fragments traversed by intra medullary rod with proximal dual interlocking dynamic hip screws. Distal interlocking cortical screw placed.  IMPRESSION: ORIF right proximal femur.   Electronically Signed   By: Lars Pinks M.D.   On: 09/01/2013 19:59   Dg Femur Right  08/28/2013   CLINICAL DATA:  Right leg pain in a paraplegic patient.  EXAM: RIGHT FEMUR - 2 VIEW  COMPARISON:  None.  FINDINGS: The patient has an intertrochanteric right hip fracture with subtrochanteric extension. There is osteolysis of the right femoral head with remodeling of the right acetabulum consistent with chronic change. Bones appear osteopenic. There is fixation of a remote healed distal femoral fracture. Advanced for age degenerative disease about the right knee is partially visualized.  IMPRESSION: Right intertrochanteric fracture with subtrochanteric extension.  Chronic appearing osteolysis and remodeling of the right hip and acetabulum.   Remote healed distal right femur fracture with fixation hardware in place.   Electronically Signed   By: Inge Rise M.D.   On: 08/28/2013 23:15   Dg Tibia/fibula Right  08/28/2013   CLINICAL DATA:  Status post fall.  Right lower leg pain.  EXAM: RIGHT TIBIA AND FIBULA - 2 VIEW  COMPARISON:  MRI right knee 12/31/2012.  FINDINGS: The patient has remote healed fractures of the distal right femur, proximal right tibia and fibula and distal right tibia and fibula. No acute bony or joint abnormality is identified. Bones are markedly osteopenic.  IMPRESSION: No acute finding.  Multiple remote fractures as above.   Electronically Signed   By: Inge Rise M.D.   On: 08/28/2013 23:16   Ct Angio Chest Pe W/cm &/or Wo Cm  09/19/2013   CLINICAL DATA:  Shortness of breath.  Hypoxia.  EXAM: CT ANGIOGRAPHY CHEST WITH CONTRAST  TECHNIQUE: Multidetector CT imaging of the chest was performed using the standard protocol during bolus administration of intravenous contrast. Multiplanar CT image reconstructions and MIPs were obtained to evaluate the vascular anatomy.  CONTRAST:  172mL OMNIPAQUE IOHEXOL 350 MG/ML SOLN  COMPARISON:  DG CHEST 1V PORT dated 09/19/2013  FINDINGS: Extensive bilateral pulmonary embolus with saddle emboli noted. Clot burden is heavy. Right ventricular to left ventricular diameter ratio abnormally high at 1.0.  AP window lymph node 0.9 cm in short axis. Right infrahilar lymph node 0.9 cm in short axis.  Patchy airspace opacities are present in the left lower lobe and left upper lobe due. There is some volume loss in the left lower lobe and atelectasis in the right lower lobe. Peripheral interstitial accentuation in the right middle lobe noted.  There appears to be a catheter in the thoracic spine.  Fatty liver noted.  Small hiatal hernia.  Review of the MIP images confirms the above findings.  IMPRESSION: 1. Extensive bilateral pulmonary embolus with heavy clot burden and evidence of right heart  strain, compatible with massive pulmonary embolus. 2. Bibasilar atelectasis, with patchy airspace opacities in the left upper lobe and left lower lobe which may reflect pulmonary hemorrhage or early infarct. 3. Fatty liver. 4. Small hiatal hernia.  Critical Value/emergent results were called by telephone at the time of interpretation on 09/19/2013 at 5:05 PM to Dr. Pricilla Loveless , who verbally acknowledged these results.   Electronically Signed   By: Herbie Baltimore M.D.   On: 09/19/2013 17:06   Dg Pelvis Portable  09/01/2013   CLINICAL DATA:  Status post fracture fixation.  EXAM: PORTABLE PELVIS 1-2 VIEWS  COMPARISON:  Plain films right femur 08/28/2013.  FINDINGS: The patient has a new dynamic hip screw and long intramedullary nail with a single distal interlocking screw for fixation of an inter and subtrochanteric fracture. A second screw is in place across the fracture. No new fracture is identified. Hardware is intact.  IMPRESSION: ORIF right inter and subtrochanteric fracture as described. No acute abnormality   Electronically Signed   By: Drusilla Kanner M.D.   On: 09/01/2013 21:38   Ct Hip Right Wo Contrast  08/31/2013   CLINICAL DATA:  Right hip pain  EXAM: CT OF THE RIGHT HIP WITHOUT CONTRAST  TECHNIQUE: Multidetector CT imaging was performed according to the standard protocol. Multiplanar CT image reconstructions were also generated.  COMPARISON:  DG FEMUR*R* dated 08/28/2013  FINDINGS: There is a comminuted right intertrochanteric fracture with mild displacement. 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 There is a large joint effusion.  There is a large hematoma measuring approximately 9.1 x 3.1 cm in greatest axial dimension and 7.6 cm in greatest craniocaudal dimension. There is a skin defect involving the posterior medial upper thigh with a small hematoma within the subcutaneous fat  adjacent to the skin defect with the hematoma measuring approximately 2.8 x 5.2 cm. There is generalized subcutaneous edema along the anterior upper thigh.  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  There are mild degenerative changes of the right SI joint.  There is a drainage catheter within the subcutaneous fat of the right lower abdomen with a 2.4 x 4.6 cm high attenuation area surrounding the catheter. There is no fluid present within this area.  IMPRESSION: 1. Comminuted right intertrochanteric fracture with mild displacement. No dislocation. 2. Large right adductor hematoma measuring 9.1 x 3.1 x 7.6 cm. There is a skin defect involving the posterior medial upper thigh with an underlying small 2.8 x 5.2 cm hematoma. 3. Drainage catheter within the subcutaneous fat of the right lower abdomen with a 2.4 x 4.6 cm high attenuation area surrounding the catheter which may represent blood products.  4. Large right hip joint effusion which may be reactive. If there is concern regarding infection, recommend arthrocentesis.   Electronically Signed   By: Elige Ko   On: 08/31/2013 15:23   Dg Chest Port 1 View  09/20/2013   CLINICAL DATA:  Assess atelectasis, history hypertension, paraplegia  EXAM: PORTABLE CHEST - 1 VIEW  COMPARISON:  Portable exam 0517 hr compared to 09/19/2013  FINDINGS: Persistent elevation of right diaphragm.  Upper normal heart size.  Normal mediastinal contours and pulmonary vascularity.  Mild right basilar  atelectasis.  Remaining lungs grossly clear.  No definite pleural effusion or pneumothorax.  IMPRESSION: Persistent elevation of right diaphragm with right basilar atelectasis.   Electronically Signed   By: Lavonia Dana M.D.   On: 09/20/2013 08:15   Dg Chest Portable 1 View  09/19/2013   CLINICAL DATA:  58 year old male shortness of breath weakness. Initial encounter.  EXAM: PORTABLE CHEST - 1 VIEW  COMPARISON:  08/29/2013 and earlier.  FINDINGS: Portable AP semi upright view  at 1434 hrs. Mildly lower lung volumes. Elevation of the right hemidiaphragm unchanged. Stable cardiac size and mediastinal contours. Visualized tracheal air column is within normal limits. No pneumothorax. No pleural effusion or consolidation. Patchy lung base opacity has not significantly changed and most resembles atelectasis.  IMPRESSION: Low lung volumes. Patchy lung base opacity not significantly changed and most resembles atelectasis.   Electronically Signed   By: Lars Pinks M.D.   On: 09/19/2013 14:52   Dg Chest Portable 1 View  08/29/2013   CLINICAL DATA:  Fall with hip and knee pain.  EXAM: PORTABLE CHEST - 1 VIEW  COMPARISON:  Chest x-ray from yesterday.  FINDINGS: Chronic elevation of the right diaphragm. Widening of the lower mediastinum which may represent hiatal hernia given surgical changes in the region. Chronic cardiomegaly. No edema, effusion, or pneumothorax.  IMPRESSION: Stable exam.  No edema or definite pneumonia.   Electronically Signed   By: Jorje Guild M.D.   On: 08/29/2013 06:30   Dg Shoulder Left  08/28/2013   CLINICAL DATA:  Fall.  Right shoulder pain.  EXAM: LEFT SHOULDER - 2+ VIEW  COMPARISON:  None.  FINDINGS: No acute bony or joint abnormality is identified. Degenerative disease about the right shoulder is seen. Image right lung and ribs are unremarkable.  IMPRESSION: No acute finding.   Electronically Signed   By: Inge Rise M.D.   On: 08/28/2013 23:17    Medications: Scheduled Meds: . atorvastatin  10 mg Oral QHS  . baclofen  20 mg Oral 3 times per day  . clonazePAM  0.5 mg Oral BID  . collagenase   Topical Daily  . DULoxetine  30 mg Oral QHS  . enoxaparin (LOVENOX) injection  100 mg Subcutaneous BID  . fentaNYL  75 mcg Transdermal Q72H  . insulin aspart  0-5 Units Subcutaneous QHS  . insulin aspart  0-9 Units Subcutaneous TID WC  . pantoprazole  40 mg Oral 2 times per day  . QUEtiapine  50 mg Oral QHS  . Warfarin - Pharmacist Dosing Inpatient   Does  not apply q1800      LOS: 4 days   Taliana Mersereau M.D. [facility] 09/23/2013, 11:27 AM Pager: [phone]  If 7PM-7AM, please contact night-coverage [email or web address] Password [email or web address]

## 2013-09-23 NOTE — Care Management Note (Unsigned)
    Page 1 of 1   09/23/2013     11:13:53 AM   CARE MANAGEMENT NOTE 09/23/2013  Patient:  Samuel [name], Samuel [name]   Account Number:  [id number]  Date Initiated:  09/20/2013  Documentation initiated by:  Luz Lex  Subjective/Objective Assessment:   Admitted from SNF with SOB - found PE with R heart strain     Action/Plan:   Anticipated DC Date:  09/23/2013   Anticipated DC Plan:  SKILLED NURSING FACILITY  In-house referral  Clinical Social Worker      DC Planning Services  CM consult      Choice offered to / List presented to:             Status of service:  In process, will continue to follow Medicare Important Message given?   (If response is "NO", the following Medicare IM given date fields will be blank) Date Medicare IM given:   Date Additional Medicare IM given:    Discharge Disposition:    Per UR Regulation:  Reviewed for med. necessity/level of care/duration of stay  If discussed at [place] of Stay Meetings, dates discussed:    Comments:  09-23-13 [place], RN, BSN [phone] Acute pulmonary embolism, bilateral, acute right leg DVT- IVC filter placed 09-22-13. Plan will be to return to SNF when stable. CM will continue to monitor.

## 2013-09-24 LAB — GLUCOSE, CAPILLARY
GLUCOSE-CAPILLARY: 94 mg/dL (ref 70–99)
GLUCOSE-CAPILLARY: 85 mg/dL (ref 70–99)
Glucose-Capillary: 101 mg/dL — ABNORMAL HIGH (ref 70–99)
Glucose-Capillary: 98 mg/dL (ref 70–99)

## 2013-09-24 LAB — PROTIME-INR
INR: 2.52 — AB (ref 0.00–1.49)
PROTHROMBIN TIME: 26.3 s — AB (ref 11.6–15.2)

## 2013-09-24 LAB — HEPATIC FUNCTION PANEL
ALK PHOS: 154 U/L — AB (ref 39–117)
ALT: 16 U/L (ref 0–53)
AST: 26 U/L (ref 0–37)
Albumin: 3.1 g/dL — ABNORMAL LOW (ref 3.5–5.2)
Total Bilirubin: 0.5 mg/dL (ref 0.3–1.2)
Total Protein: 6.7 g/dL (ref 6.0–8.3)

## 2013-09-24 MED ORDER — WARFARIN SODIUM 5 MG PO TABS
5.0000 mg | ORAL_TABLET | Freq: Once | ORAL | Status: AC
  Administered 2013-09-24: 5 mg via ORAL
  Filled 2013-09-24: qty 1

## 2013-09-24 NOTE — Progress Notes (Signed)
Patient ID: Samuel [name]  male  [id number]    DOB: 09-Nov-1955    DOA: 09/19/2013  PCP: [name], MD  Assessment/Plan: Principal Problem:   Acute pulmonary embolism, bilateral, acute right leg DVT - Currently on Lovenox, heparin drip discontinued, Coumadin started 2/24,held 2/26, 2/27 his INR was supratherapeutic  - CT chest showed extensive bilateral pulmonary embolus with heavy clot burden and evidence of right heart strain compatible with massive pulmonary embolus - 2-D echo done which showed normal LV function normal right ventricular systolic function - Per pulmonology, consider lifelong Coumadin given immobility, paraplegia and clot burden, recommended IVC filter placement in this patient with no likely in functional status. - IVC filter placed on 2/26 - Restarted Coumadin today, discussed with pharmacy, will stop Lovenox after second dose tonight. Hopefully DC to skilled nursing facility tomorrow  Active Problems:   HTN (hypertension) - Currently stable    right Hip fracture  - s/p IM nailing   - Seen by orthopedics, staples removed on 2/26, followup in 4 weeks - Per orthopedics instructions, Ok to roll For wound care - ok to sit up in bed and chair - would not weight bearing on the right leg yet     DVT of lower extremity (deep venous thrombosis) right:  - as #1  Decubitus ulcer, stage 4  - wound care   Protein calorie malnutrition (severe) and hypoalbuminemia:  - Continue prostat    DVT Prophylaxis:Therapeutic Lovenox , Coumadin restarted today   Code Status:  Family Communication:  Disposition: DC in a.m.  Consultants:  PCCM  Ortho (Dr Marlou Sa)  Procedures:  CT angio chest  Antibiotics:  None    Subjective:   feeling better, no  Complaints, no acute issues overnight  Objective: Weight change:   Intake/Output Summary (Last 24 hours) at 09/24/13 1330 Last data filed at 09/24/13 0900  Gross per 24 hour  Intake    440 ml    Output   1600 ml  Net  -1160 ml   Blood pressure 120/80, pulse 77, temperature 98 F (36.7 C), temperature source Oral, resp. rate 18, height 5\' 4"  (1.626 m), weight 102.1 kg (225 lb 1.4 oz), SpO2 94.00%.  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 Neuro: paraplegia Back: Sacral wound and reviewed, no cellulitis changes or discharge, packing still present  Lab Results: Basic Metabolic Panel:  Recent Labs Lab 09/19/13 1415 09/20/13 0019  NA 138 140  K 4.3 4.1  CL 102 105  CO2 23 22  GLUCOSE 105* 94  BUN 7 8  CREATININE 0.71 0.72  CALCIUM 9.0 8.5  MG  --  2.2  PHOS  --  4.0   Liver Function Tests:  Recent Labs Lab 09/19/13 1415  AST 17  ALT 11  ALKPHOS 205*  BILITOT 0.6  PROT 7.4  ALBUMIN 3.4*   No results found for this basename: LIPASE, AMYLASE,  in the last 168 hours No results found for this basename: AMMONIA,  in the last 168 hours CBC:  Recent Labs Lab 09/19/13 1415  09/21/13 0730 09/22/13 0350  WBC 11.9*  < > 7.0 5.1  NEUTROABS 9.6*  --   --   --   HGB 11.7*  < > 11.1* 11.2*  HCT 37.7*  < > 35.1* 35.0*  MCV 88.7  < > 88.0 87.1  PLT 307  < >  269 273  < > = values in this interval not displayed. Cardiac Enzymes: No results found for this basename: CKTOTAL, CKMB, CKMBINDEX, TROPONINI,  in the last 168 hours BNP: No components found with this basename: POCBNP,  CBG:  Recent Labs Lab 09/23/13 1220 09/23/13 1626 09/23/13 2158 09/24/13 0815 09/24/13 1139  GLUCAP 89 101* 88 94 101*     Micro Results: Recent Results (from the past 240 hour(s))  CULTURE, BLOOD (ROUTINE X 2)     Status: None   Collection Time    09/19/13  2:15 PM      Result Value Ref Range Status   Specimen Description BLOOD LEFT ARM   Final   Special Requests BOTTLES DRAWN AEROBIC AND ANAEROBIC 2.5CC   Final    Culture  Setup Time     Final   Value: 09/19/2013 19:58     Performed at [facility]   Culture     Final   Value:        BLOOD CULTURE RECEIVED NO GROWTH TO DATE CULTURE WILL BE HELD FOR 5 DAYS BEFORE ISSUING A FINAL NEGATIVE REPORT     Performed at [facility]   Report Status PENDING   Incomplete  CULTURE, BLOOD (ROUTINE X 2)     Status: None   Collection Time    09/19/13  3:33 PM      Result Value Ref Range Status   Specimen Description BLOOD RIGHT HAND   Final   Special Requests BOTTLES DRAWN AEROBIC AND ANAEROBIC 8ML   Final   Culture  Setup Time     Final   Value: 09/19/2013 19:59     Performed at [facility]   Culture     Final   Value:        BLOOD CULTURE RECEIVED NO GROWTH TO DATE CULTURE WILL BE HELD FOR 5 DAYS BEFORE ISSUING A FINAL NEGATIVE REPORT     Performed at [facility]   Report Status PENDING   Incomplete  MRSA PCR SCREENING     Status: None   Collection Time    09/19/13  7:29 PM      Result Value Ref Range Status   MRSA by PCR NEGATIVE  NEGATIVE Final   Comment:            The GeneXpert MRSA Assay (FDA     approved for NASAL specimens     only), is one component of a     comprehensive MRSA colonization     surveillance program. It is not     intended to diagnose MRSA     infection nor to guide or     monitor treatment for     MRSA infections.    Studies/Results: Dg Chest 1 View  08/28/2013   CLINICAL DATA:  Fall.  EXAM: CHEST - 1 VIEW  COMPARISON:  Single view of the chest 03/01/2013.  FINDINGS: There is unchanged elevation of the right hemidiaphragm. Lungs appear clear. Heart size is upper normal. No pneumothorax or pleural effusion. Surgical clips in the left upper quadrant the abdomen are noted.  IMPRESSION: No acute finding.   Electronically Signed   By: Inge Rise M.D.   On: 08/28/2013 23:19   Dg Pelvis 1-2 Views  08/28/2013   CLINICAL DATA:  Fall.  Pain.  EXAM: PELVIS - 1-2 VIEW  COMPARISON:  None.  FINDINGS:  There is partial visualization of a right intertrochanteric fracture. No other acute bony or joint  abnormality is identified. Osteolysis of the femoral heads is worse on the left. There is destructive change of the left inferior pubic ramus with an appearance most compatible with chronic osteomyelitis.  IMPRESSION: Partial visualization of a right intertrochanteric fracture.  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   Electronically Signed   By: Inge Rise M.D.   On: 08/28/2013 23:18   Dg Hip Operative Right  09/01/2013   CLINICAL DATA:  58 year old male undergoing repair of right hip fracture. Initial encounter.  EXAM: DG OPERATIVE RIGHT HIP  TECHNIQUE: A single spot fluoroscopic AP image of the right hip is submitted.  COMPARISON:  Preoperative film 08/28/2013.  FLUOROSCOPY TIME:  4 min 30 seconds.  FINDINGS: Four intraoperative coned-down fluoroscopic views of the right femur. Abnormal proximal right femur from the head to the intertrochanteric region with comminuted fracture again noted. Fragments traversed by intra medullary rod with proximal dual interlocking dynamic hip screws. Distal interlocking cortical screw placed.  IMPRESSION: ORIF right proximal femur.   Electronically Signed   By: Lars Pinks M.D.   On: 09/01/2013 19:59   Dg Femur Right  08/28/2013   CLINICAL DATA:  Right leg pain in a paraplegic patient.  EXAM: RIGHT FEMUR - 2 VIEW  COMPARISON:  None.  FINDINGS: The patient has an intertrochanteric right hip fracture with subtrochanteric extension. There is osteolysis of the right femoral head with remodeling of the right acetabulum consistent with chronic change. Bones appear osteopenic. There is fixation of a remote healed distal femoral fracture. Advanced for age degenerative disease about the right knee is partially visualized.  IMPRESSION: Right intertrochanteric fracture with subtrochanteric  extension.  Chronic appearing osteolysis and remodeling of the right hip and acetabulum.  Remote healed distal right femur fracture with fixation hardware in place.   Electronically Signed   By: Inge Rise M.D.   On: 08/28/2013 23:15   Dg Tibia/fibula Right  08/28/2013   CLINICAL DATA:  Status post fall.  Right lower leg pain.  EXAM: RIGHT TIBIA AND FIBULA - 2 VIEW  COMPARISON:  MRI right knee 12/31/2012.  FINDINGS: The patient has remote healed fractures of the distal right femur, proximal right tibia and fibula and distal right tibia and fibula. No acute bony or joint abnormality is identified. Bones are markedly osteopenic.  IMPRESSION: No acute finding.  Multiple remote fractures as above.   Electronically Signed   By: Inge Rise M.D.   On: 08/28/2013 23:16   Ct Angio Chest Pe W/cm &/or Wo Cm  09/19/2013   CLINICAL DATA:  Shortness of breath.  Hypoxia.  EXAM: CT ANGIOGRAPHY CHEST WITH CONTRAST  TECHNIQUE: Multidetector CT imaging of the chest was performed using the standard protocol during bolus administration of intravenous contrast. Multiplanar CT image reconstructions and MIPs were obtained to evaluate the vascular anatomy.  CONTRAST:  187mL OMNIPAQUE IOHEXOL 350 MG/ML SOLN  COMPARISON:  DG CHEST 1V PORT dated 09/19/2013  FINDINGS: Extensive bilateral pulmonary embolus with saddle emboli noted. Clot burden is heavy. Right ventricular to left ventricular diameter ratio abnormally high at 1.0.  AP window lymph node 0.9 cm in short axis. Right infrahilar lymph node 0.9 cm in short axis.  Patchy airspace opacities are present in the left lower lobe and left upper lobe due. There is some volume loss in the left lower lobe and atelectasis in the right lower lobe. Peripheral interstitial accentuation in the right middle lobe noted.  There appears to be a catheter in the thoracic spine.  Fatty liver noted.  Small hiatal hernia.  Review of the MIP images confirms the above findings.  IMPRESSION: 1.  Extensive bilateral pulmonary embolus with heavy clot burden and evidence of right heart strain, compatible with massive pulmonary embolus. 2. Bibasilar atelectasis, with patchy airspace opacities in the left upper lobe and left lower lobe which may reflect pulmonary hemorrhage or early infarct. 3. Fatty liver. 4. Small hiatal hernia.  Critical Value/emergent results were called by telephone at the time of interpretation on 09/19/2013 at 5:05 PM to Dr. Sherwood Gambler , who verbally acknowledged these results.   Electronically Signed   By: Sherryl Barters M.D.   On: 09/19/2013 17:06   Dg Pelvis Portable  09/01/2013   CLINICAL DATA:  Status post fracture fixation.  EXAM: PORTABLE PELVIS 1-2 VIEWS  COMPARISON:  Plain films right femur 08/28/2013.  FINDINGS: The patient has a new dynamic hip screw and long intramedullary nail with a single distal interlocking screw for fixation of an inter and subtrochanteric fracture. A second screw is in place across the fracture. No new fracture is identified. Hardware is intact.  IMPRESSION: ORIF right inter and subtrochanteric fracture as described. No acute abnormality   Electronically Signed   By: Inge Rise M.D.   On: 09/01/2013 21:38   Ct Hip Right Wo Contrast  08/31/2013   CLINICAL DATA:  Right hip pain  EXAM: CT OF THE RIGHT HIP WITHOUT CONTRAST  TECHNIQUE: Multidetector CT imaging was performed according to the standard protocol. Multiplanar CT image reconstructions were also generated.  COMPARISON:  DG FEMUR*R* dated 08/28/2013  FINDINGS: There is a comminuted right intertrochanteric fracture with mild displacement. 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 There is a large joint effusion.  There is a large hematoma measuring approximately 9.1 x 3.1 cm in greatest axial dimension and 7.6 cm in greatest craniocaudal dimension. There is a skin defect  involving the posterior medial upper thigh with a small hematoma within the subcutaneous fat adjacent to the skin defect with the hematoma measuring approximately 2.8 x 5.2 cm. There is generalized subcutaneous edema along the anterior upper thigh.  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  There are mild degenerative changes of the right SI joint.  There is a drainage catheter within the subcutaneous fat of the right lower abdomen with a 2.4 x 4.6 cm high attenuation area surrounding the catheter. There is no fluid present within this area.  IMPRESSION: 1. Comminuted right intertrochanteric fracture with mild displacement. No dislocation. 2. Large right adductor hematoma measuring 9.1 x 3.1 x 7.6 cm. There is a skin defect involving the posterior medial upper thigh with an underlying small 2.8 x 5.2 cm hematoma. 3. Drainage catheter within the subcutaneous fat of the right lower abdomen with a 2.4 x 4.6 cm high attenuation area surrounding the catheter which may represent blood products.  4. Large right hip joint effusion which may be reactive. If there is concern regarding infection, recommend arthrocentesis.   Electronically Signed   By: Kathreen Devoid   On: 08/31/2013 15:23   Dg Chest Port 1 View  09/20/2013   CLINICAL DATA:  Assess atelectasis, history hypertension, paraplegia  EXAM: PORTABLE CHEST - 1 VIEW  COMPARISON:  Portable exam 0517 hr compared to 09/19/2013  FINDINGS: Persistent elevation of right diaphragm.  Upper normal heart size.  Normal mediastinal contours and pulmonary vascularity.  Mild right basilar atelectasis.  Remaining lungs grossly clear.  No definite pleural effusion or pneumothorax.  IMPRESSION: Persistent elevation of right diaphragm with right basilar atelectasis.   Electronically Signed   By: Lavonia Dana M.D.   On: 09/20/2013 08:15   Dg Chest Portable 1 View  09/19/2013   CLINICAL DATA:  58 year old male shortness of breath weakness. Initial encounter.  EXAM: PORTABLE  CHEST - 1 VIEW  COMPARISON:  08/29/2013 and earlier.  FINDINGS: Portable AP semi upright view at 1434 hrs. Mildly lower lung volumes. Elevation of the right hemidiaphragm unchanged. Stable cardiac size and mediastinal contours. Visualized tracheal air column is within normal limits. No pneumothorax. No pleural effusion or consolidation. Patchy lung base opacity has not significantly changed and most resembles atelectasis.  IMPRESSION: Low lung volumes. Patchy lung base opacity not significantly changed and most resembles atelectasis.   Electronically Signed   By: Lars Pinks M.D.   On: 09/19/2013 14:52   Dg Chest Portable 1 View  08/29/2013   CLINICAL DATA:  Fall with hip and knee pain.  EXAM: PORTABLE CHEST - 1 VIEW  COMPARISON:  Chest x-ray from yesterday.  FINDINGS: Chronic elevation of the right diaphragm. Widening of the lower mediastinum which may represent hiatal hernia given surgical changes in the region. Chronic cardiomegaly. No edema, effusion, or pneumothorax.  IMPRESSION: Stable exam.  No edema or definite pneumonia.   Electronically Signed   By: Jorje Guild M.D.   On: 08/29/2013 06:30   Dg Shoulder Left  08/28/2013   CLINICAL DATA:  Fall.  Right shoulder pain.  EXAM: LEFT SHOULDER - 2+ VIEW  COMPARISON:  None.  FINDINGS: No acute bony or joint abnormality is identified. Degenerative disease about the right shoulder is seen. Image right lung and ribs are unremarkable.  IMPRESSION: No acute finding.   Electronically Signed   By: Inge Rise M.D.   On: 08/28/2013 23:17    Medications: Scheduled Meds: . atorvastatin  10 mg Oral QHS  . baclofen  20 mg Oral 3 times per day  . clonazePAM  0.5 mg Oral BID  . collagenase   Topical Daily  . DULoxetine  30 mg Oral QHS  . enoxaparin (LOVENOX) injection  100 mg Subcutaneous BID  . fentaNYL  75 mcg Transdermal Q72H  . insulin aspart  0-5 Units Subcutaneous QHS  . insulin aspart  0-9 Units Subcutaneous TID WC  . pantoprazole  40 mg Oral 2  times per day  . QUEtiapine  50 mg Oral QHS  . warfarin  5 mg Oral ONCE-1800  . Warfarin - Pharmacist Dosing Inpatient   Does not apply q1800      LOS: 5 days   Cleofas Hudgins M.D. [facility] 09/24/2013, 1:30 PM Pager: [phone]  If 7PM-7AM, please contact night-coverage [email or web address] Password [email or web address]

## 2013-09-24 NOTE — Progress Notes (Addendum)
[place] for lovenox and warfarin  Indication: PE  No Known Allergies  Patient Measurements: Height: 5' 4" (162.6 cm) Weight: 225 lb 1.4 oz (102.1 kg) IBW/kg (Calculated) : 59.2 Heparin Dosing Weight: 85kg  Vital Signs: Temp: 98.8 F (37.1 C) (02/28 0641) Temp src: Oral (02/28 0641) BP: 136/74 mmHg (02/28 0641) Pulse Rate: 58 (02/28 0641)  Labs:  Recent Labs  09/22/13 0350 09/22/13 0530 09/23/13 0632 09/24/13 0521  HGB 11.2*  --   --   --   HCT 35.0*  --   --   --   PLT 273  --   --   --   LABPROT  --  27.5* 36.1* 26.3*  INR  --  2.67* 3.81* 2.52*    Estimated Creatinine Clearance: 111.4 ml/min (by C-G formula based on Cr of 0.72).  Assessment: 58 yo male with PE on lovenox and warfarin - today is day # 5/5 day overlap. IVC filter placed 2/26.  INR rose sharply after 2 doses of warfarin 51m and was held for 2 days. INR is now back in therapeutic range.  H/H remains low but stable and platelets stable- last checked on 2/26. No bleeding reported. Asked RN to let pharmacy know if any bleeding or bruising is noted.  Goal of Therapy:  INR 2-3 Anti - Xa 0.6 -1.2 4 hours after Lovenox dose if needed Monitor platelets by anticoagulation protocol: Yes   Plan:  1. Continue Lovenox 1051msq q 12 hours- MD: consider stopping Lovenox therapy as day #5 of 5 day overlap and >2 days therapeutic.  Also request repeat LFTs on warfarin as Alk Phos was elevated on 2/23.  2. Resume warfarin at 1/2 dose of 37m36monight.  3. CBC q 72 hours 4. Daily INR 5. Follow for s/s of bleeding    JesSloan LeiterharmD, BCPS Clinical Pharmacist [phone]/28/2015 7:48 AM

## 2013-09-25 DIAGNOSIS — G8929 Other chronic pain: Secondary | ICD-10-CM

## 2013-09-25 LAB — CULTURE, BLOOD (ROUTINE X 2)
CULTURE: NO GROWTH
CULTURE: NO GROWTH

## 2013-09-25 LAB — CBC
HCT: 35.9 % — ABNORMAL LOW (ref 39.0–52.0)
Hemoglobin: 11.6 g/dL — ABNORMAL LOW (ref 13.0–17.0)
MCH: 28.2 pg (ref 26.0–34.0)
MCHC: 32.3 g/dL (ref 30.0–36.0)
MCV: 87.1 fL (ref 78.0–100.0)
Platelets: 254 10*3/uL (ref 150–400)
RBC: 4.12 MIL/uL — AB (ref 4.22–5.81)
RDW: 16.2 % — AB (ref 11.5–15.5)
WBC: 6.2 10*3/uL (ref 4.0–10.5)

## 2013-09-25 LAB — GLUCOSE, CAPILLARY: Glucose-Capillary: 95 mg/dL (ref 70–99)

## 2013-09-25 LAB — PROTIME-INR
INR: 2.2 — ABNORMAL HIGH (ref 0.00–1.49)
Prothrombin Time: 23.7 seconds — ABNORMAL HIGH (ref 11.6–15.2)

## 2013-09-25 MED ORDER — CALCIUM CARBONATE ANTACID 500 MG PO CHEW
1.0000 | CHEWABLE_TABLET | Freq: Once | ORAL | Status: AC
  Administered 2013-09-25: 200 mg via ORAL
  Filled 2013-09-25: qty 1

## 2013-09-25 MED ORDER — QUETIAPINE FUMARATE 50 MG PO TABS: 50.0000 mg | ORAL_TABLET | Freq: Every day | ORAL | Status: DC

## 2013-09-25 MED ORDER — CLONAZEPAM 0.5 MG PO TABS: 0.5000 mg | ORAL_TABLET | Freq: Three times a day (TID) | ORAL | Status: DC | PRN

## 2013-09-25 MED ORDER — OXYCODONE HCL 15 MG PO TABS: 15.0000 mg | ORAL_TABLET | Freq: Three times a day (TID) | ORAL | Status: DC | PRN

## 2013-09-25 MED ORDER — WARFARIN SODIUM 5 MG PO TABS: 5.0000 mg | ORAL_TABLET | Freq: Every day | ORAL | Status: DC

## 2013-09-25 MED ORDER — FENTANYL 75 MCG/HR TD PT72: 75.0000 ug | MEDICATED_PATCH | TRANSDERMAL | Status: DC

## 2013-09-25 NOTE — Progress Notes (Signed)
6 staples removed from right mid outer thigh.  Clean dry and intact.

## 2013-09-25 NOTE — Progress Notes (Signed)
CSW (Clinical Education officer, museum) prepared pt dc packet and placed with shadow chart. Faxed dc summary to facility and confirmed pt can dc back. CSW arranged non-emergent ambulance transport. Pt, pt nurse, and facility informed. CSW signing off.  [place], [place]

## 2013-09-25 NOTE — Plan of Care (Signed)
Problem: Phase I Progression Outcomes Goal: Voiding-avoid urinary catheter unless indicated Outcome: Not Applicable Date Met:  56/97/94 Chronic foley

## 2013-09-25 NOTE — Discharge Summary (Signed)
Physician Discharge Summary  Patient ID: Samuel [name] MRN: [id number] DOB/AGE: 08/15/1955 58 y.o.  Admit date: 09/19/2013 Discharge date: 09/25/2013  Primary Care Physician:  [name], MD  Discharge Diagnoses:   Acute hypoxic respiratory failure secondary to bilateral pulmonary embolism resolved  . Acute pulmonary embolism bilateral with heavy clot burden  . acute right lower extremity DVT (deep venous thrombosis) . stage IV Sacral wound . HTN (hypertension) . right Hip fracture, post op 09/01/13, staples removed  Consults:  Pulm critical care                   Orthopedics Dr Marlou Sa   Recommendations for Outpatient Follow-up:  Please obtain PT/INR tomorrow and adjust Coumadin dose  Staples right hip removed. Per ortho, Dr Marlou Sa, Ok to roll  For wound care - ok to sit up in bed and chair - would not wb on the right leg yet    Allergies:  No Known Allergies   Discharge Medications:   Medication List    STOP taking these medications       amoxicillin-clavulanate 875-125 MG per tablet  Commonly known as:  AUGMENTIN     dextrose 5 % SOLN 50 mL with cefTRIAXone 2 G SOLR 2 g     oseltamivir 75 MG capsule  Commonly known as:  TAMIFLU     sulfamethoxazole-trimethoprim 800-160 MG per tablet  Commonly known as:  BACTRIM DS      TAKE these medications       acetaminophen 325 MG tablet  Commonly known as:  TYLENOL  Take 650 mg by mouth every 6 (six) hours as needed (pain). Do not exceed 4 gms of Tylenol in 24 hours     alum & mag hydroxide-simeth 200-200-20 MG/5ML suspension  Commonly known as:  MAALOX/MYLANTA  Take 20 mLs by mouth 2 (two) times daily as needed for heartburn.     amLODipine 5 MG tablet  Commonly known as:  NORVASC  Take 1 tablet (5 mg total) by mouth at bedtime.     atorvastatin 10 MG tablet  Commonly known as:  LIPITOR  Take 10 mg by mouth at bedtime.     baclofen 20 MG tablet  Commonly known as:  LIORESAL  Take 20 mg by mouth 3  (three) times daily. 9am, 2pm, 9pm     clonazePAM 1 MG tablet  Commonly known as:  KLONOPIN  Take 1 mg by mouth 3 (three) times daily.     clonazePAM 0.5 MG tablet  Commonly known as:  KLONOPIN  Take 1 tablet (0.5 mg total) by mouth every 8 (eight) hours as needed for anxiety.     DULoxetine 30 MG capsule  Commonly known as:  CYMBALTA  Take 30 mg by mouth at bedtime.     fentaNYL 75 MCG/HR  Commonly known as:  DURAGESIC - dosed mcg/hr  Place 1 patch (75 mcg total) onto the skin every 3 (three) days. *Remove old Patch*Rotate Site*     ferrous sulfate 325 (65 FE) MG tablet  Take 1 tablet (325 mg total) by mouth daily with breakfast.     folic acid 1 MG tablet  Commonly known as:  FOLVITE  Take 1 mg by mouth daily.     GENERLAC 10 GM/15ML Soln  Generic drug:  lactulose (encephalopathy)  Take 20 g by mouth 4 (four) times daily. 9am, 1pm, 5pm, 9pm     lamoTRIgine 200 MG tablet  Commonly known as:  LAMICTAL  Take 200 mg by  mouth 2 (two) times daily. To stabilize bipolar mood     meclizine 25 MG tablet  Commonly known as:  ANTIVERT  Take 1 tablet (25 mg total) by mouth 3 (three) times daily as needed for dizziness.     Melatonin 3 MG Caps  Take 3 mg by mouth at bedtime.     nitroGLYCERIN 0.4 MG SL tablet  Commonly known as:  NITROSTAT  Place 0.4 mg under the tongue every 5 (five) minutes as needed for chest pain. x3 doses as needed for chest pain     oxybutynin 5 MG 24 hr tablet  Commonly known as:  DITROPAN-XL  Take 5 mg by mouth daily.     oxyCODONE 15 MG immediate release tablet  Commonly known as:  ROXICODONE  Take 1 tablet (15 mg total) by mouth 3 (three) times daily as needed (moderate to severe pain).     pantoprazole 40 MG tablet  Commonly known as:  PROTONIX  Take 40 mg by mouth 2 (two) times daily. 9am and 5pm     potassium chloride 10 MEQ CR capsule  Commonly known as:  MICRO-K  Take 10 mEq by mouth daily.     pregabalin 75 MG capsule  Commonly known  as:  LYRICA  Take one capsule by mouth twice daily     PROBIOTIC DAILY PO  Take 1 tablet by mouth daily. For 14 days     promethazine 25 MG tablet  Commonly known as:  PHENERGAN  Take 12.5 mg by mouth every 6 (six) hours as needed for nausea or vomiting.     QUEtiapine 50 MG tablet  Commonly known as:  SEROQUEL  Take 1 tablet (50 mg total) by mouth at bedtime.     senna-docusate 8.6-50 MG per tablet  Commonly known as:  Senokot-S  Take 1 tablet by mouth daily.     STENDRA 100 MG Tabs  Generic drug:  Avanafil  Take 100 mg by mouth daily as needed (edema). Do not take with nitroglycerin or other nitrates - must be separate by a 24 hour period     SYSTANE OP  Place 2 drops into both eyes 3 (three) times daily. 8am, 2pm, 8pm     vitamin B-12 1000 MCG tablet  Commonly known as:  CYANOCOBALAMIN  Take 1,000 mcg by mouth daily.     Vitamin D3 50000 UNITS Caps  Take 50,000 Units by mouth every 30 (thirty) days. On the 11th of each month     warfarin 5 MG tablet  Commonly known as:  COUMADIN  Take 1 tablet (5 mg total) by mouth daily. Dose to be adjusted according to PT/INR         Brief H and P: For complete details please refer to admission H and P, but in brief please note the patient was admitted by critical care service on 09/19/2013. Per admission history, 58 yr old paraplegia, recent hip surgery from fall, now submassive PE with acute respiratory failure.    Hospital Course:  Patient is a 58 year old male who had a right hip repair surgery after a fall from the wheelchair a month ago prior to admission. Patient presented with shortness of breath and hypoxia on 02/23 and was admitted by critical care service. CT angiogram of the chest revealed bilateral pulmonary embolism with large clot burden with right heart strain compatible with massive PE and acute right lower extremity DVT. Patient was admitted for further workup.   Acute pulmonary embolism, bilateral, acute  right  leg DVT  Patient was originally started on the heparin drip, subsequently transitioned to Lovenox subcutaneous injections. He has completed the bridge with the Coumadin yesterday on 09/24/2013. Coumadin was started 2/24,held 2/26, 2/27 as his INR was supratherapeutic. CT chest showed extensive bilateral pulmonary embolus with heavy clot burden and evidence of right heart strain compatible with massive pulmonary embolus. 2-D echo done which showed normal LV function normal right ventricular systolic function. Per pulmonology, consider lifelong Coumadin given immobility, paraplegia and clot burden, recommended IVC filter placement in this patient with no likely in functional status. Interventional radiology was consulted and patient underwent IVC filter placement on 2/26. Patient is currently on Coumadin 5 mg daily, INR has been therapeutic, 2.2 today, 2.5 yesterday (2/28).  HTN (hypertension) - Currently stable   right Hip fracture - s/p IM nailing. Orthopedics was consulted and patient was seen by Dr Marlou Sa. Staples removed, followup in 4 weeks. Per orthopedics instructions, Ok to roll For wound care - ok to sit up in bed and chair - would not weight bearing on the right leg yet   DVT of lower extremity (deep venous thrombosis) right: - as #1   Decubitus ulcer, stage 4 : continue wound care per PCP  Protein calorie malnutrition (severe) and hypoalbuminemia:  - Continue prostat    Day of Discharge BP 133/68  Pulse 57  Temp(Src) 97.9 F (36.6 C) (Oral)  Resp 20  Ht 5\' 4"  (1.626 m)  Wt 105.688 kg (233 lb)  BMI 39.97 kg/m2  SpO2 92%  Physical Exam:  General: Alert and awake, oriented x3, NAD  CVS: S1-S2 clear, no murmur rubs or gallops  Chest: CTA anteriorly  Abdomen: soft NT, ND, NBS  Extremities: Amputation left, right leg w/o edema  Neuro: paraplegia  Back: Sacral wound and reviewed, no cellulitis changes or discharge   The results of significant diagnostics from this  hospitalization (including imaging, microbiology, ancillary and laboratory) are listed below for reference.    LAB RESULTS: Basic Metabolic Panel:  Recent Labs Lab 09/19/13 1415 09/20/13 0019  NA 138 140  K 4.3 4.1  CL 102 105  CO2 23 22  GLUCOSE 105* 94  BUN 7 8  CREATININE 0.71 0.72  CALCIUM 9.0 8.5  MG  --  2.2  PHOS  --  4.0   Liver Function Tests:  Recent Labs Lab 09/19/13 1415 09/24/13 1508  AST 17 26  ALT 11 16  ALKPHOS 205* 154*  BILITOT 0.6 0.5  PROT 7.4 6.7  ALBUMIN 3.4* 3.1*   No results found for this basename: LIPASE, AMYLASE,  in the last 168 hours No results found for this basename: AMMONIA,  in the last 168 hours CBC:  Recent Labs Lab 09/19/13 1415  09/22/13 0350 09/25/13 0445  WBC 11.9*  < > 5.1 6.2  NEUTROABS 9.6*  --   --   --   HGB 11.7*  < > 11.2* 11.6*  HCT 37.7*  < > 35.0* 35.9*  MCV 88.7  < > 87.1 87.1  PLT 307  < > 273 254  < > = values in this interval not displayed. Cardiac Enzymes: No results found for this basename: CKTOTAL, CKMB, CKMBINDEX, TROPONINI,  in the last 168 hours BNP: No components found with this basename: POCBNP,  CBG:  Recent Labs Lab 09/24/13 2007 09/25/13 0821  GLUCAP 85 95    Significant Diagnostic Studies:  Ct Angio Chest Pe W/cm &/or Wo Cm  09/19/2013   CLINICAL DATA:  Shortness of breath.  Hypoxia.  EXAM: CT ANGIOGRAPHY CHEST WITH CONTRAST  TECHNIQUE: Multidetector CT imaging of the chest was performed using the standard protocol during bolus administration of intravenous contrast. Multiplanar CT image reconstructions and MIPs were obtained to evaluate the vascular anatomy.  CONTRAST:  170mL OMNIPAQUE IOHEXOL 350 MG/ML SOLN  COMPARISON:  DG CHEST 1V PORT dated 09/19/2013  FINDINGS: Extensive bilateral pulmonary embolus with saddle emboli noted. Clot burden is heavy. Right ventricular to left ventricular diameter ratio abnormally high at 1.0.  AP window lymph node 0.9 cm in short axis. Right infrahilar  lymph node 0.9 cm in short axis.  Patchy airspace opacities are present in the left lower lobe and left upper lobe due. There is some volume loss in the left lower lobe and atelectasis in the right lower lobe. Peripheral interstitial accentuation in the right middle lobe noted.  There appears to be a catheter in the thoracic spine.  Fatty liver noted.  Small hiatal hernia.  Review of the MIP images confirms the above findings.  IMPRESSION: 1. Extensive bilateral pulmonary embolus with heavy clot burden and evidence of right heart strain, compatible with massive pulmonary embolus. 2. Bibasilar atelectasis, with patchy airspace opacities in the left upper lobe and left lower lobe which may reflect pulmonary hemorrhage or early infarct. 3. Fatty liver. 4. Small hiatal hernia.  Critical Value/emergent results were called by telephone at the time of interpretation on 09/19/2013 at 5:05 PM to Dr. Sherwood Gambler , who verbally acknowledged these results.   Electronically Signed   By: Sherryl Barters M.D.   On: 09/19/2013 17:06   Dg Chest Port 1 View  09/20/2013   CLINICAL DATA:  Assess atelectasis, history hypertension, paraplegia  EXAM: PORTABLE CHEST - 1 VIEW  COMPARISON:  Portable exam 0517 hr compared to 09/19/2013  FINDINGS: Persistent elevation of right diaphragm.  Upper normal heart size.  Normal mediastinal contours and pulmonary vascularity.  Mild right basilar atelectasis.  Remaining lungs grossly clear.  No definite pleural effusion or pneumothorax.  IMPRESSION: Persistent elevation of right diaphragm with right basilar atelectasis.   Electronically Signed   By: Lavonia Dana M.D.   On: 09/20/2013 08:15   Dg Chest Portable 1 View  09/19/2013   CLINICAL DATA:  58 year old male shortness of breath weakness. Initial encounter.  EXAM: PORTABLE CHEST - 1 VIEW  COMPARISON:  08/29/2013 and earlier.  FINDINGS: Portable AP semi upright view at 1434 hrs. Mildly lower lung volumes. Elevation of the right hemidiaphragm  unchanged. Stable cardiac size and mediastinal contours. Visualized tracheal air column is within normal limits. No pneumothorax. No pleural effusion or consolidation. Patchy lung base opacity has not significantly changed and most resembles atelectasis.  IMPRESSION: Low lung volumes. Patchy lung base opacity not significantly changed and most resembles atelectasis.   Electronically Signed   By: Lars Pinks M.D.   On: 09/19/2013 14:52    2D ECHO: Study Conclusions  - Left ventricle: The cavity size was normal. There was mild concentric hypertrophy. Systolic function was normal. The estimated ejection fraction was in the range of 55% to 60%. Left ventricular diastolic function parameters were normal. - Left atrium: The atrium was mildly dilated.    Disposition and Follow-up:     Discharge Orders   Future Orders Complete By Expires   Diet Carb Modified  As directed    Discharge instructions  As directed    Comments:     1) Please check PT/INR tomorrow 09/26/13 and adjust coumadin  dose according to INR  2) Staples right hip removed. Per ortho, Dr Marlou Sa, Ok to roll  For wound care - ok to sit up in bed and chair - would not wb on the right leg yet   Increase activity slowly  As directed        [place] PT,INR  DISCHARGE FOLLOW-UP Follow-up Information   Follow up with [name], MD. Schedule an appointment as soon as possible for a visit in 1 week. (for hospital follow-up, labs, PT/INR, wound check)    Specialty:  Internal Medicine   Contact information:   [place] [address] [phone]       Follow up with [name], MD. Schedule an appointment as soon as possible for a visit in 4 weeks. (for hospital follow-up)    Specialty:  Orthopedic Surgery   Contact information:   [place]  [phone]       Time spent on Discharge: 45  mins  Signed:   RAI,RIPUDEEP M.D. [facility] 09/25/2013, 8:46 AM Pager: [phone]

## 2013-09-27 ENCOUNTER — Other Ambulatory Visit: Payer: Self-pay | Admitting: *Deleted

## 2013-09-27 ENCOUNTER — Non-Acute Institutional Stay (SKILLED_NURSING_FACILITY): Payer: PRIVATE HEALTH INSURANCE | Admitting: Internal Medicine

## 2013-09-27 DIAGNOSIS — G8220 Paraplegia, unspecified: Secondary | ICD-10-CM

## 2013-09-27 DIAGNOSIS — L8990 Pressure ulcer of unspecified site, unspecified stage: Secondary | ICD-10-CM

## 2013-09-27 DIAGNOSIS — L8993 Pressure ulcer of unspecified site, stage 3: Secondary | ICD-10-CM

## 2013-09-27 DIAGNOSIS — N319 Neuromuscular dysfunction of bladder, unspecified: Secondary | ICD-10-CM

## 2013-09-27 DIAGNOSIS — IMO0002 Reserved for concepts with insufficient information to code with codable children: Secondary | ICD-10-CM

## 2013-09-27 DIAGNOSIS — I2699 Other pulmonary embolism without acute cor pulmonale: Secondary | ICD-10-CM

## 2013-09-27 MED ORDER — HYDROCODONE-ACETAMINOPHEN 5-325 MG PO TABS: ORAL_TABLET | ORAL | Status: DC

## 2013-09-27 NOTE — Telephone Encounter (Signed)
[facility] 
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
## 2013-10-17 ENCOUNTER — Other Ambulatory Visit: Payer: Self-pay | Admitting: *Deleted

## 2013-10-17 MED ORDER — PREGABALIN 75 MG PO CAPS: ORAL_CAPSULE | ORAL | Status: DC

## 2013-10-17 MED ORDER — OXYCODONE HCL 15 MG PO TABS: 15.0000 mg | ORAL_TABLET | Freq: Three times a day (TID) | ORAL | Status: DC | PRN

## 2013-10-17 NOTE — Telephone Encounter (Signed)
[facility] 

## 2013-10-19 ENCOUNTER — Other Ambulatory Visit: Payer: Self-pay | Admitting: *Deleted

## 2013-10-19 MED ORDER — CLONAZEPAM 1 MG PO TABS: ORAL_TABLET | ORAL | Status: DC

## 2013-10-19 NOTE — Telephone Encounter (Signed)
[facility] 

## 2013-10-27 ENCOUNTER — Other Ambulatory Visit: Payer: Self-pay | Admitting: *Deleted

## 2013-10-27 MED ORDER — FENTANYL 75 MCG/HR TD PT72: 75.0000 ug | MEDICATED_PATCH | TRANSDERMAL | Status: DC

## 2013-10-27 NOTE — Telephone Encounter (Signed)
[facility] 

## 2013-11-15 ENCOUNTER — Non-Acute Institutional Stay (SKILLED_NURSING_FACILITY): Payer: PRIVATE HEALTH INSURANCE | Admitting: Internal Medicine

## 2013-11-15 DIAGNOSIS — L8990 Pressure ulcer of unspecified site, unspecified stage: Secondary | ICD-10-CM

## 2013-11-15 DIAGNOSIS — L8994 Pressure ulcer of unspecified site, stage 4: Secondary | ICD-10-CM

## 2013-11-15 DIAGNOSIS — J189 Pneumonia, unspecified organism: Secondary | ICD-10-CM
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
## 2013-11-28 ENCOUNTER — Other Ambulatory Visit: Payer: Self-pay | Admitting: *Deleted

## 2013-11-28 MED ORDER — FENTANYL 75 MCG/HR TD PT72: 75.0000 ug | MEDICATED_PATCH | TRANSDERMAL | Status: DC

## 2013-11-28 NOTE — Telephone Encounter (Signed)
[facility] 

## 2013-12-07 ENCOUNTER — Emergency Department (HOSPITAL_COMMUNITY): Payer: PRIVATE HEALTH INSURANCE

## 2013-12-07 ENCOUNTER — Encounter (HOSPITAL_COMMUNITY): Payer: Self-pay | Admitting: Emergency Medicine

## 2013-12-07 ENCOUNTER — Inpatient Hospital Stay (HOSPITAL_COMMUNITY)
Admission: EM | Admit: 2013-12-07 | Discharge: 2013-12-10 | DRG: 871 | Disposition: A | Discharge status: Skilled Nursing Facility | Payer: PRIVATE HEALTH INSURANCE | Attending: Family Medicine | Admitting: Family Medicine

## 2013-12-07 DIAGNOSIS — Z993 Dependence on wheelchair: Secondary | ICD-10-CM

## 2013-12-07 DIAGNOSIS — D689 Coagulation defect, unspecified: Secondary | ICD-10-CM

## 2013-12-07 DIAGNOSIS — Z79899 Other long term (current) drug therapy: Secondary | ICD-10-CM

## 2013-12-07 DIAGNOSIS — N390 Urinary tract infection, site not specified: Secondary | ICD-10-CM

## 2013-12-07 DIAGNOSIS — I10 Essential (primary) hypertension: Secondary | ICD-10-CM

## 2013-12-07 DIAGNOSIS — G9340 Encephalopathy, unspecified: Secondary | ICD-10-CM

## 2013-12-07 DIAGNOSIS — S78112A Complete traumatic amputation at level between left hip and knee, initial encounter: Secondary | ICD-10-CM

## 2013-12-07 DIAGNOSIS — G8220 Paraplegia, unspecified: Secondary | ICD-10-CM | POA: Diagnosis present

## 2013-12-07 DIAGNOSIS — Z86711 Personal history of pulmonary embolism: Secondary | ICD-10-CM

## 2013-12-07 DIAGNOSIS — J189 Pneumonia, unspecified organism: Secondary | ICD-10-CM

## 2013-12-07 DIAGNOSIS — Z7901 Long term (current) use of anticoagulants: Secondary | ICD-10-CM

## 2013-12-07 DIAGNOSIS — G4700 Insomnia, unspecified: Secondary | ICD-10-CM

## 2013-12-07 DIAGNOSIS — Y9210 Unspecified residential institution as the place of occurrence of the external cause: Secondary | ICD-10-CM | POA: Diagnosis present

## 2013-12-07 DIAGNOSIS — S78119A Complete traumatic amputation at level between unspecified hip and knee, initial encounter: Secondary | ICD-10-CM

## 2013-12-07 DIAGNOSIS — F319 Bipolar disorder, unspecified: Secondary | ICD-10-CM

## 2013-12-07 DIAGNOSIS — I82409 Acute embolism and thrombosis of unspecified deep veins of unspecified lower extremity: Secondary | ICD-10-CM

## 2013-12-07 DIAGNOSIS — E785 Hyperlipidemia, unspecified: Secondary | ICD-10-CM

## 2013-12-07 DIAGNOSIS — N319 Neuromuscular dysfunction of bladder, unspecified: Secondary | ICD-10-CM | POA: Diagnosis present

## 2013-12-07 DIAGNOSIS — Z933 Colostomy status: Secondary | ICD-10-CM

## 2013-12-07 DIAGNOSIS — IMO0002 Reserved for concepts with insufficient information to code with codable children: Secondary | ICD-10-CM

## 2013-12-07 DIAGNOSIS — S31000A Unspecified open wound of lower back and pelvis without penetration into retroperitoneum, initial encounter: Secondary | ICD-10-CM

## 2013-12-07 DIAGNOSIS — K219 Gastro-esophageal reflux disease without esophagitis: Secondary | ICD-10-CM | POA: Diagnosis present

## 2013-12-07 DIAGNOSIS — W06XXXA Fall from bed, initial encounter: Secondary | ICD-10-CM | POA: Diagnosis present

## 2013-12-07 DIAGNOSIS — G8929 Other chronic pain: Secondary | ICD-10-CM

## 2013-12-07 DIAGNOSIS — I2699 Other pulmonary embolism without acute cor pulmonale: Secondary | ICD-10-CM

## 2013-12-07 DIAGNOSIS — N179 Acute kidney failure, unspecified: Secondary | ICD-10-CM

## 2013-12-07 DIAGNOSIS — D62 Acute posthemorrhagic anemia: Secondary | ICD-10-CM

## 2013-12-07 DIAGNOSIS — Z86718 Personal history of other venous thrombosis and embolism: Secondary | ICD-10-CM

## 2013-12-07 DIAGNOSIS — A419 Sepsis, unspecified organism: Principal | ICD-10-CM

## 2013-12-07 DIAGNOSIS — E876 Hypokalemia: Secondary | ICD-10-CM

## 2013-12-07 DIAGNOSIS — B1920 Unspecified viral hepatitis C without hepatic coma: Secondary | ICD-10-CM

## 2013-12-07 DIAGNOSIS — S72009A Fracture of unspecified part of neck of unspecified femur, initial encounter for closed fracture: Secondary | ICD-10-CM

## 2013-12-07 LAB — CBC WITH DIFFERENTIAL/PLATELET
BASOS PCT: 0 % (ref 0–1)
Basophils Absolute: 0 10*3/uL (ref 0.0–0.1)
EOS ABS: 0.4 10*3/uL (ref 0.0–0.7)
Eosinophils Relative: 3 % (ref 0–5)
HCT: 50.6 % (ref 39.0–52.0)
HEMOGLOBIN: 16.7 g/dL (ref 13.0–17.0)
Lymphocytes Relative: 14 % (ref 12–46)
Lymphs Abs: 1.9 10*3/uL (ref 0.7–4.0)
MCH: 27.8 pg (ref 26.0–34.0)
MCHC: 33 g/dL (ref 30.0–36.0)
MCV: 84.2 fL (ref 78.0–100.0)
MONO ABS: 0.6 10*3/uL (ref 0.1–1.0)
Monocytes Relative: 4 % (ref 3–12)
NEUTROS PCT: 78 % — AB (ref 43–77)
Neutro Abs: 10.3 10*3/uL — ABNORMAL HIGH (ref 1.7–7.7)
Platelets: 207 10*3/uL (ref 150–400)
RBC: 6.01 MIL/uL — ABNORMAL HIGH (ref 4.22–5.81)
RDW: 14.8 % (ref 11.5–15.5)
WBC: 13.2 10*3/uL — ABNORMAL HIGH (ref 4.0–10.5)

## 2013-12-07 LAB — HEPATIC FUNCTION PANEL
ALT: 20 U/L (ref 0–53)
AST: 24 U/L (ref 0–37)
Albumin: 3.8 g/dL (ref 3.5–5.2)
Alkaline Phosphatase: 102 U/L (ref 39–117)
Bilirubin, Direct: <0.2 mg/dL (ref 0.0–0.3)
Total Bilirubin: 0.5 mg/dL (ref 0.3–1.2)
Total Protein: 7.9 g/dL (ref 6.0–8.3)

## 2013-12-07 LAB — BASIC METABOLIC PANEL
BUN: 6 mg/dL (ref 6–23)
CO2: 21 mEq/L (ref 19–32)
Calcium: 9.3 mg/dL (ref 8.4–10.5)
Chloride: 103 mEq/L (ref 96–112)
Creatinine, Ser: 0.55 mg/dL (ref 0.50–1.35)
Glucose, Bld: 105 mg/dL — ABNORMAL HIGH (ref 70–99)
POTASSIUM: 4.5 meq/L (ref 3.7–5.3)
Sodium: 141 mEq/L (ref 137–147)

## 2013-12-07 LAB — I-STAT CG4 LACTIC ACID, ED: Lactic Acid, Venous: 2.25 mmol/L — ABNORMAL HIGH (ref 0.5–2.2)

## 2013-12-07 LAB — PROTIME-INR
INR: 2.81 — AB (ref 0.00–1.49)
Prothrombin Time: 28.6 seconds — ABNORMAL HIGH (ref 11.6–15.2)

## 2013-12-07 LAB — APTT: APTT: 52 s — AB (ref 24–37)

## 2013-12-07 MED ORDER — SODIUM CHLORIDE 0.9 % IV SOLN
1000.0000 mL | Freq: Once | INTRAVENOUS | Status: AC
  Administered 2013-12-07: 1000 mL via INTRAVENOUS

## 2013-12-07 MED ORDER — VANCOMYCIN HCL IN DEXTROSE 1-5 GM/200ML-% IV SOLN
1000.0000 mg | Freq: Once | INTRAVENOUS | Status: AC
  Administered 2013-12-08: 1000 mg via INTRAVENOUS
  Filled 2013-12-07: qty 200

## 2013-12-07 MED ORDER — SODIUM CHLORIDE 0.9 % IV SOLN: 1000.0000 mL | Freq: Once | INTRAVENOUS | Status: DC

## 2013-12-07 MED ORDER — PIPERACILLIN-TAZOBACTAM 3.375 G IVPB 30 MIN
3.3750 g | Freq: Once | INTRAVENOUS | Status: AC
  Administered 2013-12-07: 3.375 g via INTRAVENOUS
  Filled 2013-12-07: qty 50

## 2013-12-07 MED ORDER — SODIUM CHLORIDE 0.9 % IV SOLN: 1000.0000 mL | INTRAVENOUS | Status: DC

## 2013-12-07 NOTE — ED Notes (Signed)
Pt from [facility] via [facility] c/o falling out of his bed (about a foot from the ground). He reports hitting his head but denies LOC. He his having left arm pain and clavical pain.Denies neck or back pain. Pt is on coumadin. Address ad phone number to [facility] is [address] Time and [phone].

## 2013-12-07 NOTE — ED Notes (Addendum)
Initial Contact - pt reports rolled approx 1 foot out of bed onto floor, c/o falling onto L shoulder and hitting L side of head.  Pt denies LOC.  Pt reports taking coumadin "for blood clots".  Pt c/o pain to L shoulder, no obvious deformities noted.  +csm/+pulses.  Pt appears rigorous.  Tachycardiac, hypoxic.  Pt placed to o2 via [place] with minimal improvement.  EDPA at bedside.  Skin PWD.  Pt denies CP/palpitations.  Speaking full sentences, rr even/un-lab.  LSCTAB, difficult to assess secondary to body habitus.  Pt with colostomy present to L quad, denies changes/complaints with stool.  No stool present in bag at this time.  Pt also reports "healing" decubitus.  Approx 1" open area noted to peri-rectal area.  Edges red, no slough/drainage noted.  Pt reports wound is improved from previously.  Pt also with foley patent, dark yellow urine noted in bedside bag.  Pt reports "it got pulled out the other day".  Pt changed to hospital gown, placed to cardiac/02 monitor.

## 2013-12-07 NOTE — ED Notes (Addendum)
IV access attempted, unsuccessful.  Pt refusing further attempts at this time.  Phleb at bedside to attempt for labs/cultures and IVT paged.  EDPA aware.

## 2013-12-07 NOTE — ED Notes (Signed)
Notified ERP Knapp of abnormal lab results

## 2013-12-07 NOTE — ED Notes (Signed)
Bed: GB20 Expected date:  Expected time:  Means of arrival:  Comments: EMS 58yo Fall

## 2013-12-07 NOTE — ED Notes (Signed)
IVT at bedside to attempt IV access.
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
## 2013-12-07 NOTE — ED Notes (Signed)
Pt to radiology.

## 2013-12-08 ENCOUNTER — Encounter (HOSPITAL_COMMUNITY): Payer: Self-pay

## 2013-12-08 DIAGNOSIS — G8220 Paraplegia, unspecified: Secondary | ICD-10-CM | POA: Diagnosis present

## 2013-12-08 DIAGNOSIS — Y9210 Unspecified residential institution as the place of occurrence of the external cause: Secondary | ICD-10-CM | POA: Diagnosis present

## 2013-12-08 DIAGNOSIS — J189 Pneumonia, unspecified organism: Secondary | ICD-10-CM | POA: Diagnosis present

## 2013-12-08 DIAGNOSIS — Z993 Dependence on wheelchair: Secondary | ICD-10-CM | POA: Diagnosis not present

## 2013-12-08 DIAGNOSIS — Z933 Colostomy status: Secondary | ICD-10-CM | POA: Diagnosis not present

## 2013-12-08 DIAGNOSIS — Z86711 Personal history of pulmonary embolism: Secondary | ICD-10-CM | POA: Diagnosis not present

## 2013-12-08 DIAGNOSIS — E785 Hyperlipidemia, unspecified: Secondary | ICD-10-CM | POA: Diagnosis present

## 2013-12-08 DIAGNOSIS — K219 Gastro-esophageal reflux disease without esophagitis: Secondary | ICD-10-CM | POA: Diagnosis present

## 2013-12-08 DIAGNOSIS — A419 Sepsis, unspecified organism: Secondary | ICD-10-CM | POA: Diagnosis present

## 2013-12-08 DIAGNOSIS — Z79899 Other long term (current) drug therapy: Secondary | ICD-10-CM | POA: Diagnosis not present

## 2013-12-08 DIAGNOSIS — I10 Essential (primary) hypertension: Secondary | ICD-10-CM | POA: Diagnosis present

## 2013-12-08 DIAGNOSIS — IMO0002 Reserved for concepts with insufficient information to code with codable children: Secondary | ICD-10-CM | POA: Diagnosis not present

## 2013-12-08 DIAGNOSIS — N390 Urinary tract infection, site not specified: Secondary | ICD-10-CM

## 2013-12-08 DIAGNOSIS — Z7901 Long term (current) use of anticoagulants: Secondary | ICD-10-CM | POA: Diagnosis not present

## 2013-12-08 DIAGNOSIS — N319 Neuromuscular dysfunction of bladder, unspecified: Secondary | ICD-10-CM | POA: Diagnosis present

## 2013-12-08 DIAGNOSIS — R0602 Shortness of breath: Secondary | ICD-10-CM | POA: Diagnosis present

## 2013-12-08 DIAGNOSIS — Z86718 Personal history of other venous thrombosis and embolism: Secondary | ICD-10-CM | POA: Diagnosis not present

## 2013-12-08 DIAGNOSIS — G8929 Other chronic pain: Secondary | ICD-10-CM | POA: Diagnosis present

## 2013-12-08 DIAGNOSIS — S78119A Complete traumatic amputation at level between unspecified hip and knee, initial encounter: Secondary | ICD-10-CM | POA: Diagnosis not present

## 2013-12-08 DIAGNOSIS — W06XXXA Fall from bed, initial encounter: Secondary | ICD-10-CM | POA: Diagnosis present

## 2013-12-08 LAB — URINALYSIS, ROUTINE W REFLEX MICROSCOPIC
Bilirubin Urine: NEGATIVE
Glucose, UA: NEGATIVE mg/dL
Ketones, ur: NEGATIVE mg/dL
NITRITE: NEGATIVE
Protein, ur: NEGATIVE mg/dL
SPECIFIC GRAVITY, URINE: 1.012 (ref 1.005–1.030)
UROBILINOGEN UA: 0.2 mg/dL (ref 0.0–1.0)
pH: 5.5 (ref 5.0–8.0)

## 2013-12-08 LAB — COMPREHENSIVE METABOLIC PANEL
ALBUMIN: 3.1 g/dL — AB (ref 3.5–5.2)
ALT: 17 U/L (ref 0–53)
AST: 22 U/L (ref 0–37)
Alkaline Phosphatase: 82 U/L (ref 39–117)
BILIRUBIN TOTAL: 0.6 mg/dL (ref 0.3–1.2)
BUN: 6 mg/dL (ref 6–23)
CHLORIDE: 102 meq/L (ref 96–112)
CO2: 24 mEq/L (ref 19–32)
Calcium: 8.4 mg/dL (ref 8.4–10.5)
Creatinine, Ser: 0.56 mg/dL (ref 0.50–1.35)
GFR calc Af Amer: >90 mL/min (ref >90)
GFR calc non Af Amer: >90 mL/min (ref >90)
GLUCOSE: 137 mg/dL — AB (ref 70–99)
Potassium: 4.1 mEq/L (ref 3.7–5.3)
SODIUM: 141 meq/L (ref 137–147)
TOTAL PROTEIN: 6.7 g/dL (ref 6.0–8.3)

## 2013-12-08 LAB — CBC WITH DIFFERENTIAL/PLATELET
Basophils Absolute: 0 10*3/uL (ref 0.0–0.1)
Basophils Relative: 0 % (ref 0–1)
EOS ABS: 0 10*3/uL (ref 0.0–0.7)
Eosinophils Relative: 0 % (ref 0–5)
HCT: 43.4 % (ref 39.0–52.0)
HEMOGLOBIN: 14.2 g/dL (ref 13.0–17.0)
Lymphocytes Relative: 4 % — ABNORMAL LOW (ref 12–46)
Lymphs Abs: 0.8 10*3/uL (ref 0.7–4.0)
MCH: 27.7 pg (ref 26.0–34.0)
MCHC: 32.7 g/dL (ref 30.0–36.0)
MCV: 84.6 fL (ref 78.0–100.0)
Monocytes Absolute: 0.9 10*3/uL (ref 0.1–1.0)
Monocytes Relative: 4 % (ref 3–12)
NEUTROS PCT: 91 % — AB (ref 43–77)
Neutro Abs: 19.2 10*3/uL — ABNORMAL HIGH (ref 1.7–7.7)
PLATELETS: 244 10*3/uL (ref 150–400)
RBC: 5.13 MIL/uL (ref 4.22–5.81)
RDW: 14.9 % (ref 11.5–15.5)
WBC: 21.1 10*3/uL — AB (ref 4.0–10.5)

## 2013-12-08 LAB — URINE MICROSCOPIC-ADD ON

## 2013-12-08 LAB — LEGIONELLA ANTIGEN, URINE: Legionella Antigen, Urine: NEGATIVE

## 2013-12-08 LAB — STREP PNEUMONIAE URINARY ANTIGEN: STREP PNEUMO URINARY ANTIGEN: NEGATIVE

## 2013-12-08 LAB — HIV ANTIBODY (ROUTINE TESTING W REFLEX): HIV: NONREACTIVE

## 2013-12-08 LAB — PRO B NATRIURETIC PEPTIDE: Pro B Natriuretic peptide (BNP): 56.9 pg/mL (ref 0–125)

## 2013-12-08 LAB — PROTIME-INR
INR: 3.33 — AB (ref 0.00–1.49)
Prothrombin Time: 32.6 seconds — ABNORMAL HIGH (ref 11.6–15.2)

## 2013-12-08 LAB — MRSA PCR SCREENING: MRSA by PCR: POSITIVE — AB

## 2013-12-08 MED ORDER — WARFARIN - PHARMACIST DOSING INPATIENT: Freq: Every day | Status: DC

## 2013-12-08 MED ORDER — WARFARIN SODIUM 3 MG PO TABS
3.5000 mg | ORAL_TABLET | Freq: Every day | ORAL | Status: DC
  Filled 2013-12-08: qty 1

## 2013-12-08 MED ORDER — QUETIAPINE FUMARATE 50 MG PO TABS
50.0000 mg | ORAL_TABLET | Freq: Two times a day (BID) | ORAL | Status: DC
  Administered 2013-12-08 – 2013-12-10 (×5): 50 mg via ORAL
  Filled 2013-12-08 (×7): qty 1

## 2013-12-08 MED ORDER — FENTANYL 75 MCG/HR TD PT72
75.0000 ug | MEDICATED_PATCH | TRANSDERMAL | Status: DC
  Administered 2013-12-10: 75 ug via TRANSDERMAL
  Filled 2013-12-08: qty 1

## 2013-12-08 MED ORDER — CLONAZEPAM 1 MG PO TABS
1.0000 mg | ORAL_TABLET | Freq: Four times a day (QID) | ORAL | Status: DC
  Administered 2013-12-08 – 2013-12-10 (×7): 1 mg via ORAL
  Filled 2013-12-08 (×8): qty 1

## 2013-12-08 MED ORDER — DEXTROSE 5 % IV SOLN
1.0000 g | Freq: Three times a day (TID) | INTRAVENOUS | Status: DC
  Administered 2013-12-08 – 2013-12-10 (×7): 1 g via INTRAVENOUS
  Filled 2013-12-08 (×9): qty 1

## 2013-12-08 MED ORDER — NITROGLYCERIN 0.4 MG SL SUBL: 0.4000 mg | SUBLINGUAL_TABLET | SUBLINGUAL | Status: DC | PRN

## 2013-12-08 MED ORDER — PANTOPRAZOLE SODIUM 40 MG PO TBEC
40.0000 mg | DELAYED_RELEASE_TABLET | Freq: Two times a day (BID) | ORAL | Status: DC
  Administered 2013-12-08 – 2013-12-10 (×5): 40 mg via ORAL
  Filled 2013-12-08 (×6): qty 1

## 2013-12-08 MED ORDER — LAMOTRIGINE 200 MG PO TABS
200.0000 mg | ORAL_TABLET | Freq: Two times a day (BID) | ORAL | Status: DC
  Administered 2013-12-08 – 2013-12-10 (×5): 200 mg via ORAL
  Filled 2013-12-08 (×6): qty 1

## 2013-12-08 MED ORDER — ONDANSETRON HCL 4 MG/2ML IJ SOLN: 4.0000 mg | Freq: Three times a day (TID) | INTRAMUSCULAR | Status: AC | PRN

## 2013-12-08 MED ORDER — VITAMIN D (ERGOCALCIFEROL) 1.25 MG (50000 UNIT) PO CAPS: 50000.0000 [IU] | ORAL_CAPSULE | ORAL | Status: DC

## 2013-12-08 MED ORDER — VANCOMYCIN HCL IN DEXTROSE 1-5 GM/200ML-% IV SOLN
1000.0000 mg | Freq: Two times a day (BID) | INTRAVENOUS | Status: DC
  Administered 2013-12-08 – 2013-12-09 (×4): 1000 mg via INTRAVENOUS
  Filled 2013-12-08 (×5): qty 200

## 2013-12-08 MED ORDER — DULOXETINE HCL 30 MG PO CPEP
30.0000 mg | ORAL_CAPSULE | Freq: Every day | ORAL | Status: DC
  Administered 2013-12-08 – 2013-12-09 (×2): 30 mg via ORAL
  Filled 2013-12-08 (×3): qty 1

## 2013-12-08 MED ORDER — AMLODIPINE BESYLATE 5 MG PO TABS
5.0000 mg | ORAL_TABLET | Freq: Every day | ORAL | Status: DC
  Administered 2013-12-08 – 2013-12-09 (×2): 5 mg via ORAL
  Filled 2013-12-08 (×3): qty 1

## 2013-12-08 MED ORDER — SODIUM CHLORIDE 0.9 % IV SOLN
INTRAVENOUS | Status: DC
  Administered 2013-12-08 (×2): via INTRAVENOUS

## 2013-12-08 MED ORDER — ALUM & MAG HYDROXIDE-SIMETH 200-200-20 MG/5ML PO SUSP
30.0000 mL | Freq: Four times a day (QID) | ORAL | Status: DC | PRN
  Administered 2013-12-08 – 2013-12-09 (×2): 30 mL via ORAL
  Filled 2013-12-08 (×2): qty 30

## 2013-12-08 MED ORDER — CLONAZEPAM 0.5 MG PO TABS
0.5000 mg | ORAL_TABLET | Freq: Two times a day (BID) | ORAL | Status: DC | PRN
  Administered 2013-12-08: 0.5 mg via ORAL
  Filled 2013-12-08: qty 1

## 2013-12-08 MED ORDER — BACLOFEN 20 MG PO TABS
20.0000 mg | ORAL_TABLET | Freq: Three times a day (TID) | ORAL | Status: DC
  Administered 2013-12-08 – 2013-12-10 (×7): 20 mg via ORAL
  Filled 2013-12-08 (×9): qty 1

## 2013-12-08 MED ORDER — WARFARIN SODIUM 3 MG PO TABS
3.5000 mg | ORAL_TABLET | ORAL | Status: DC
  Filled 2013-12-08: qty 1

## 2013-12-08 MED ORDER — LACTULOSE 10 GM/15ML PO SOLN
20.0000 g | Freq: Four times a day (QID) | ORAL | Status: DC
  Administered 2013-12-08 – 2013-12-10 (×3): 20 g via ORAL
  Filled 2013-12-08 (×12): qty 30

## 2013-12-08 MED ORDER — CALCIUM CARBONATE ANTACID 500 MG PO CHEW
500.0000 mg | CHEWABLE_TABLET | ORAL | Status: DC | PRN
  Administered 2013-12-08: 500 mg via ORAL
  Filled 2013-12-08: qty 1

## 2013-12-08 MED ORDER — ATORVASTATIN CALCIUM 10 MG PO TABS
10.0000 mg | ORAL_TABLET | Freq: Every day | ORAL | Status: DC
  Administered 2013-12-08 – 2013-12-09 (×2): 10 mg via ORAL
  Filled 2013-12-08 (×3): qty 1

## 2013-12-08 MED ORDER — OXYBUTYNIN CHLORIDE ER 5 MG PO TB24
5.0000 mg | ORAL_TABLET | Freq: Every day | ORAL | Status: DC
  Administered 2013-12-08 – 2013-12-10 (×3): 5 mg via ORAL
  Filled 2013-12-08 (×3): qty 1

## 2013-12-08 MED ORDER — GI COCKTAIL ~~LOC~~
30.0000 mL | Freq: Once | ORAL | Status: AC
  Administered 2013-12-08: 30 mL via ORAL
  Filled 2013-12-08: qty 30

## 2013-12-08 MED ORDER — FERROUS SULFATE 325 (65 FE) MG PO TABS
325.0000 mg | ORAL_TABLET | Freq: Every day | ORAL | Status: DC
  Administered 2013-12-08 – 2013-12-10 (×3): 325 mg via ORAL
  Filled 2013-12-08 (×4): qty 1

## 2013-12-08 MED ORDER — ALBUTEROL SULFATE (2.5 MG/3ML) 0.083% IN NEBU: 2.5000 mg | INHALATION_SOLUTION | RESPIRATORY_TRACT | Status: DC | PRN

## 2013-12-08 MED ORDER — VITAMIN B-12 1000 MCG PO TABS
1000.0000 ug | ORAL_TABLET | Freq: Every day | ORAL | Status: DC
  Administered 2013-12-08 – 2013-12-10 (×3): 1000 ug via ORAL
  Filled 2013-12-08 (×4): qty 1

## 2013-12-08 MED ORDER — PREGABALIN 75 MG PO CAPS
75.0000 mg | ORAL_CAPSULE | Freq: Two times a day (BID) | ORAL | Status: DC
  Administered 2013-12-08 – 2013-12-10 (×3): 75 mg via ORAL
  Filled 2013-12-08 (×6): qty 1

## 2013-12-08 MED ORDER — OXYCODONE HCL 5 MG PO TABS
15.0000 mg | ORAL_TABLET | Freq: Three times a day (TID) | ORAL | Status: DC | PRN
  Administered 2013-12-08 – 2013-12-10 (×4): 15 mg via ORAL
  Filled 2013-12-08 (×4): qty 3

## 2013-12-08 MED ORDER — FOLIC ACID 1 MG PO TABS
1.0000 mg | ORAL_TABLET | Freq: Every day | ORAL | Status: DC
  Administered 2013-12-08 – 2013-12-10 (×3): 1 mg via ORAL
  Filled 2013-12-08 (×3): qty 1

## 2013-12-08 MED ORDER — SENNOSIDES-DOCUSATE SODIUM 8.6-50 MG PO TABS
1.0000 | ORAL_TABLET | Freq: Every day | ORAL | Status: DC
  Administered 2013-12-09 – 2013-12-10 (×2): 1 via ORAL
  Filled 2013-12-08 (×5): qty 1

## 2013-12-08 NOTE — Consult Note (Signed)
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 He is not the best historian, but states these procedures may have been performed 3 years ago.  Ulcers are currently healed and patient has no open areas.  Preventive care plan in place to prevent recurrence. Excellent hygiene is to be provided to prevent Moisture Associated Skin Damage (MASD), particularly intertriginous dermatitis (ITD) due to his skin folds and body habitus. Wound type:Healed Pressure Ulcers, Stage IV Pressure Ulcer POA: No Measurement: no open areas at this time Wound HMC:NOBS Drainage (amount, consistency, odor) None Periwound:N/A Dressing procedure/placement/frequency: I have implemented a preventive care plan using a soft silicone foam dressing to the sacral area with twice weekly and PRN changes. Patient turns independently but encouragement to turn is still required as he prefers the supine position while in bed.  He mentions that he has inadequate wheelchair pressure redistribution and this is needed, but perhaps can be addressed in his post-acute setting. There is no skin breakdown on the right heel. Patient has in indwelling urinary catheter and a diverting colostomy that he manages using closed end pouches so that he does not have to empty.  He has come to the hospital with 6 such pouches and understands that if he is here longer than that, we may need to use the drainable pouches and that we would assist him with emptying should that be the case.  There are no other needs identified for the [facility] Nurse at this time. [facility] nursing team will not follow, but will remain available to this patient, the nursing and medical teams.  Please re-consult if needed. Thanks, Maudie Flakes, MSN, RN, [place], [employer], [place] [phone])
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
## 2013-12-08 NOTE — Progress Notes (Signed)
Nutrition Brief Note  Patient identified on the Malnutrition Screening Tool (MST) Report  Wt Readings from Last 15 Encounters:  12/08/13 238 lb 8.6 oz (108.2 kg)  09/25/13 233 lb (105.688 kg)  09/01/13 243 lb 2.7 oz (110.3 kg)  09/01/13 243 lb 2.7 oz (110.3 kg)  03/03/13 259 lb 7 oz (117.68 kg)  02/03/13 259 lb 0.7 oz (117.5 kg)  02/03/13 259 lb 0.7 oz (117.5 kg)  12/10/12 247 lb (112.038 kg)  12/03/12 222 lb (100.699 kg)    Body mass index is 40.92 kg/(m^2). Patient meets criteria for Obesity III/Morbid Obesity based on current BMI.   Current diet order is heart healthy/Carb Mod, patient is consuming approximately >75% of meals at this time. Labs and medications reviewed.   Pt endorsed an intentional wt loss of 20 lbs over past 10 months d/t healthy diet changes-portion control, limiting high fat/sugary foods. Denied any changes in appetite. Pt is from SNF, who manages meals and appropriate diet modifications. Has hx of stage 2 pressure ulcer, currently healed per WOC note. Pt w/out questoins regarding weight loss or DM2 diet recommendations.  No nutrition interventions warranted at this time. If nutrition issues arise, please consult RD.   Atlee Abide MS RD LDN Clinical Dietitian [phone]
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
## 2013-12-08 NOTE — Progress Notes (Signed)
Patient seen and evaluated earlier this am by my associate. Please refer to H and P regarding assessment and plan.  [place]

## 2013-12-08 NOTE — Progress Notes (Signed)
Pt MRSA swab came back positive. Pt placed in contact precautions.
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
## 2013-12-08 NOTE — ED Notes (Addendum)
Called lab to inquire if could add a Trop. To the blood already in lab and lab advised that the blood is too old and pt would not to be stuck again; pt is refusing to be stuck; Monico Blitz, PA made aware

## 2013-12-08 NOTE — Progress Notes (Signed)
ANTICOAGULATION CONSULT NOTE - Follow Up Consult  Pharmacy Consult for Warfarin Indication: h/o PE  No Known Allergies  Patient Measurements: Height: 5\' 4"  (162.6 cm) Weight: 238 lb 8.6 oz (108.2 kg) IBW/kg (Calculated) : 59.2   Vital Signs: Temp: 98.2 F (36.8 C) (05/14 0528) Temp src: Oral (05/14 0528) BP: 94/54 mmHg (05/14 0528) Pulse Rate: 89 (05/14 0528)  Labs:  Recent Labs  12/07/13 2145 12/07/13 2221 12/08/13 0430  HGB 16.7  --  14.2  HCT 50.6  --  43.4  PLT 207  --  244  APTT  --  52*  --   LABPROT  --  28.6* 32.6*  INR  --  2.81* 3.33*  CREATININE 0.55  --  0.56    Estimated Creatinine Clearance: 114.9 ml/min (by C-G formula based on Cr of 0.56).    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  Pharmacy consulted to manage warfarin.   Home warfarin dose reported as 3.5 mg daily.  Last dose 5/13 PTA.  INR 2.81 on admission and increased to 3.33 this AM.  Hgb/Plts WNL  Goal of Therapy:  INR 2-3 Monitor platelets by anticoagulation protocol: Yes   Plan:  Hold warfarin today for supratherapeutic INR. Follow up INR in AM.  Donald Prose Orion Mole 12/08/2013,10:17 AM

## 2013-12-08 NOTE — Care Management Note (Signed)
    Page 1 of 1   12/08/2013     4:23:27 PM CARE MANAGEMENT NOTE 12/08/2013  Patient:  Samuel [name], Samuel [name]   Account Number:  [id number]  Date Initiated:  12/08/2013  Documentation initiated by:  Dessa Phi  Subjective/Objective Assessment:   58 Y/O M ADMITTED W/HCAP,FALL.HX:+MRSA,L HIP ULCER,PARAPLEGIC.     Action/Plan:   FROM SNF-GHC.   Anticipated DC Date:  12/12/2013   Anticipated DC Plan:  [place]  CM consult      Choice offered to / List presented to:             Status of service:  In process, will continue to follow Medicare Important Message given?   (If response is "NO", the following Medicare IM given date fields will be blank) Date Medicare IM given:   Date Additional Medicare IM given:    Discharge Disposition:    Per UR Regulation:  Reviewed for med. necessity/level of care/duration of stay  If discussed at Long Length of Stay Meetings, dates discussed:    Comments:  12/08/13 Airyana Sprunger RN,BSN NCM [phone] IV ABX,WOC FOLLOWING.

## 2013-12-08 NOTE — ED Notes (Signed)
Pt declined to any further lab work to be drawn.

## 2013-12-09 LAB — PROTIME-INR
INR: 3.32 — ABNORMAL HIGH (ref 0.00–1.49)
Prothrombin Time: 32.5 seconds — ABNORMAL HIGH (ref 11.6–15.2)

## 2013-12-09 LAB — URINE CULTURE: SPECIAL REQUESTS: NORMAL

## 2013-12-09 LAB — HEMOGLOBIN A1C
Hgb A1c MFr Bld: 5.9 % — ABNORMAL HIGH (ref <5.7)
Mean Plasma Glucose: 123 mg/dL — ABNORMAL HIGH (ref <117)

## 2013-12-09 LAB — VANCOMYCIN, TROUGH: VANCOMYCIN TR: 11.3 ug/mL (ref 10.0–20.0)

## 2013-12-09 MED ORDER — CHLORHEXIDINE GLUCONATE CLOTH 2 % EX PADS
6.0000 | MEDICATED_PAD | Freq: Every day | CUTANEOUS | Status: DC
  Administered 2013-12-10: 6 via TOPICAL

## 2013-12-09 MED ORDER — VANCOMYCIN HCL 500 MG IV SOLR
500.0000 mg | Freq: Once | INTRAVENOUS | Status: AC
  Administered 2013-12-09: 500 mg via INTRAVENOUS
  Filled 2013-12-09: qty 500

## 2013-12-09 MED ORDER — VANCOMYCIN HCL 10 G IV SOLR
1250.0000 mg | Freq: Two times a day (BID) | INTRAVENOUS | Status: DC
  Administered 2013-12-10: 1250 mg via INTRAVENOUS
  Filled 2013-12-09 (×2): qty 1250

## 2013-12-09 MED ORDER — MUPIROCIN 2 % EX OINT
1.0000 "application " | TOPICAL_OINTMENT | Freq: Two times a day (BID) | CUTANEOUS | Status: DC
  Administered 2013-12-09 – 2013-12-10 (×3): 1 via NASAL
  Filled 2013-12-09: qty 22

## 2013-12-09 NOTE — Progress Notes (Signed)
ANTICOAGULATION CONSULT NOTE - Follow Up Consult  Pharmacy Consult for Warfarin Indication: h/o PE  No Known Allergies  Patient Measurements: Height: 5\' 4"  (162.6 cm) Weight: 238 lb 8.6 oz (108.2 kg) IBW/kg (Calculated) : 59.2   Vital Signs: Temp: 98.1 F (36.7 C) (05/15 0610) Temp src: Oral (05/15 0610) BP: 113/69 mmHg (05/15 0610) Pulse Rate: 66 (05/15 0610)  Labs:  Recent Labs  12/07/13 2145 12/07/13 2221 12/08/13 0430 12/09/13 0355  HGB 16.7  --  14.2  --   HCT 50.6  --  43.4  --   PLT 207  --  244  --   APTT  --  52*  --   --   LABPROT  --  28.6* 32.6* 32.5*  INR  --  2.81* 3.33* 3.32*  CREATININE 0.55  --  0.56  --     Estimated Creatinine Clearance: 114.9 ml/min (by C-G formula based on Cr of 0.56).    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  Pharmacy consulted to manage warfarin.   Home warfarin dose reported as 3.5 mg daily.  Last dose 5/13 PTA.  INR 3.32 this AM.  Warfarin dose held yesterday.  Hgb/Plts WNL 5/14  Patient eating 100% of meals.  Goal of Therapy:  INR 2-3 Monitor platelets by anticoagulation protocol: Yes   Plan:  Hold warfarin today for supratherapeutic INR. Follow up INR in AM.  Donald Prose Guillaume Weninger 12/09/2013,10:43 AM

## 2013-12-09 NOTE — Progress Notes (Signed)
[facility] PROGRESS NOTE  Samuel [name] NWG:[id number] DOB: [birth year] DOA: 12/07/2013 PCP: [name], MD  Assessment/Plan:   Sepsis - Most likely secondary to Hcap - Improving on broad spectrum antibiotics  HCAP (healthcare-associated pneumonia) - We'll plan on continuing cefepime and vancomycin - Sputum culture pending - HIV antibody negative/nonreactive, urine Legionella/strep negative  Active Problems:   Chronic pain - Continue current regimen      HTN (hypertension)   Sacral wound - Wound care nurse to evaluate and make further recommendations  DVT - INR supratherapeutic - Coumadin per pharmacy dosing  Hyperglycemia - Will check hemoglobin A1c. Patient once diet change regular diet despite mildly elevated blood sugars. But should blood sugars remain elevated he is okay with changing back to diabetic diet  Code Status: full Family Communication: No family at bedside Disposition Plan: Pending improvement in respiratory condition.   Consultants:  None  Procedures:  None  Antibiotics:  Cefepime and vancomycin  HPI/Subjective: No new complaints. Patient inquiring about discharge  Objective: Filed Vitals:   12/09/13 1542  BP: 128/64  Pulse: 68  Temp: 98.4 F (36.9 C)  Resp: 20    Intake/Output Summary (Last 24 hours) at 12/09/13 1545 Last data filed at 12/09/13 1543  Gross per 24 hour  Intake   2580 ml  Output   2500 ml  Net     80 ml   Filed Weights   12/07/13 2352 12/08/13 0239  Weight: 106.595 kg (235 lb) 108.2 kg (238 lb 8.6 oz)    Exam:   General:  Patient in no acute distress, alert and awake  Cardiovascular: Regular rate and rhythm no murmurs rub  Respiratory: Nasal cannula in place breath sounds bilaterally, no increased work of breathing  Abdomen: Soft, nondistended, obese  Musculoskeletal: No cyanosis or clubbing  Data Reviewed: Basic Metabolic Panel:  Recent Labs Lab 12/07/13 2145 12/08/13 0430   NA 141 141  K 4.5 4.1  CL 103 102  CO2 21 24  GLUCOSE 105* 137*  BUN 6 6  CREATININE 0.55 0.56  CALCIUM 9.3 8.4   Liver Function Tests:  Recent Labs Lab 12/07/13 2145 12/08/13 0430  AST 24 22  ALT 20 17  ALKPHOS 102 82  BILITOT 0.5 0.6  PROT 7.9 6.7  ALBUMIN 3.8 3.1*   No results found for this basename: LIPASE, AMYLASE,  in the last 168 hours No results found for this basename: AMMONIA,  in the last 168 hours CBC:  Recent Labs Lab 12/07/13 2145 12/08/13 0430  WBC 13.2* 21.1*  NEUTROABS 10.3* 19.2*  HGB 16.7 14.2  HCT 50.6 43.4  MCV 84.2 84.6  PLT 207 244   Cardiac Enzymes: No results found for this basename: CKTOTAL, CKMB, CKMBINDEX, TROPONINI,  in the last 168 hours BNP (last 3 results)  Recent Labs  09/19/13 1415 12/07/13 2221  PROBNP 123.8 56.9   CBG: No results found for this basename: GLUCAP,  in the last 168 hours  Recent Results (from the past 240 hour(s))  CULTURE, BLOOD (ROUTINE X 2)     Status: None   Collection Time    12/07/13  9:45 PM      Result Value Ref Range Status   Specimen Description BLOOD LEFT HAND   Final   Special Requests BOTTLES DRAWN AEROBIC ONLY 1ML   Final   Culture  Setup Time     Final   Value: 12/08/2013 01:05     Performed at [facility]  Final   Value:        BLOOD CULTURE RECEIVED NO GROWTH TO DATE CULTURE WILL BE HELD FOR 5 DAYS BEFORE ISSUING A FINAL NEGATIVE REPORT     Performed at [facility]   Report Status PENDING   Incomplete  CULTURE, BLOOD (ROUTINE X 2)     Status: None   Collection Time    12/07/13  9:54 PM      Result Value Ref Range Status   Specimen Description BLOOD RIGHT HAND   Final   Special Requests BOTTLES DRAWN AEROBIC ONLY 1ML   Final   Culture  Setup Time     Final   Value: 12/08/2013 01:05     Performed at [facility]   Culture     Final   Value:        BLOOD CULTURE RECEIVED NO GROWTH TO DATE CULTURE WILL BE HELD FOR 5 DAYS BEFORE ISSUING A  FINAL NEGATIVE REPORT     Performed at [facility]   Report Status PENDING   Incomplete  URINE CULTURE     Status: None   Collection Time    12/07/13 11:53 PM      Result Value Ref Range Status   Specimen Description URINE, CATHETERIZED   Final   Special Requests NONE   Final   Culture  Setup Time     Final   Value: 12/08/2013 04:25     Performed at [place] PENDING   Incomplete   Culture     Final   Value: Culture reincubated for better growth     Performed at [facility]   Report Status PENDING   Incomplete  URINE CULTURE     Status: None   Collection Time    12/07/13 11:53 PM      Result Value Ref Range Status   Specimen Description URINE, CATHETERIZED   Final   Special Requests Normal   Final   Culture  Setup Time     Final   Value: 12/08/2013 10:03     Performed at [place]     Final   Value: >=100,000 COLONIES/ML     Performed at [facility]   Culture     Final   Value: Multiple bacterial morphotypes present, none predominant. Suggest appropriate recollection if clinically indicated.     Performed at [facility]   Report Status 12/09/2013 FINAL   Final  MRSA PCR SCREENING     Status: Abnormal   Collection Time    12/08/13  3:23 AM      Result Value Ref Range Status   MRSA by PCR POSITIVE (*) NEGATIVE Final   Comment: RESULT CALLED TO, READ BACK BY AND VERIFIED WITH:     MARSHALL,REBECCA AT 0633 05.14.15 BY TIBBITTS,K                The GeneXpert MRSA Assay (FDA     approved for NASAL specimens     only), is one component of a     comprehensive MRSA colonization     surveillance program. It is not     intended to diagnose MRSA     infection nor to guide or     monitor treatment for     MRSA infections.     Studies: Dg Chest 1 View  12/07/2013   CLINICAL DATA:  Shortness of breath  EXAM: CHEST -  1 VIEW  COMPARISON:  09/20/2013  FINDINGS: Elevation of the right  hemidiaphragm is again seen. The cardiac shadow is mildly enlarged. Patchy infiltrative changes are noted in the left lung base. No acute bony abnormality is seen.  IMPRESSION: Patchy left basilar infiltrate.   Electronically Signed   By: Inez Catalina M.D.   On: 12/07/2013 23:54   Ct Head Wo Contrast  12/07/2013   CLINICAL DATA:  Recent traumatic injury and pain  EXAM: CT HEAD WITHOUT CONTRAST  TECHNIQUE: Contiguous axial images were obtained from the base of the skull through the vertex without intravenous contrast.  COMPARISON:  None.  FINDINGS: The bony calvarium is intact. No gross soft tissue abnormality is noted. The ventricles and cortical sulci are within normal limits for the patient's age. No findings to suggest acute hemorrhage, acute infarction or space-occupying mass lesion are noted.  IMPRESSION: No acute abnormality noted.   Electronically Signed   By: Inez Catalina M.D.   On: 12/07/2013 23:59   Dg Shoulder Left  12/07/2013   CLINICAL DATA:  Left shoulder pain following injury  EXAM: LEFT SHOULDER - 2+ VIEW  COMPARISON:  None.  FINDINGS: No acute fracture or dislocation is noted. Mild degenerative changes of the glenohumeral and acromioclavicular joint are seen. No other focal abnormality is noted.  IMPRESSION: Degenerative change without acute abnormality.   Electronically Signed   By: Inez Catalina M.D.   On: 12/07/2013 23:58   Dg Knee Complete 4 Views Right  12/07/2013   CLINICAL DATA:  Recent traumatic injury with pain  EXAM: RIGHT KNEE - COMPLETE 4+ VIEW  COMPARISON:  08/28/2013  FINDINGS: Postsurgical changes are noted in the distal femur. Posttraumatic changes are noted in the proximal tibia and fibula. No acute fracture or dislocation is seen. No joint effusion is noted. No hardware failure is noted.  IMPRESSION: Chronic changes without acute abnormality.   Electronically Signed   By: Inez Catalina M.D.   On: 12/07/2013 23:55    Scheduled Meds: . amLODipine  5 mg Oral QHS  .  atorvastatin  10 mg Oral QHS  . baclofen  20 mg Oral TID  . ceFEPime (MAXIPIME) IV  1 g Intravenous 3 times per day  . [START ON 12/10/2013] Chlorhexidine Gluconate Cloth  6 each Topical Q0600  . clonazePAM  1 mg Oral QID  . DULoxetine  30 mg Oral QHS  . [START ON 12/10/2013] fentaNYL  75 mcg Transdermal Q72H  . ferrous sulfate  325 mg Oral Q breakfast  . folic acid  1 mg Oral Daily  . lactulose  20 g Oral QID  . lamoTRIgine  200 mg Oral BID  . mupirocin ointment  1 application Nasal BID  . oxybutynin  5 mg Oral Daily  . pantoprazole  40 mg Oral BID  . pregabalin  75 mg Oral BID  . QUEtiapine  50 mg Oral Q12H  . senna-docusate  1 tablet Oral Daily  . vancomycin  1,000 mg Intravenous Q12H  . vitamin B-12  1,000 mcg Oral Daily  . [START ON 01/05/2014] Vitamin D (Ergocalciferol)  50,000 Units Oral Q30 days  . Warfarin - Pharmacist Dosing Inpatient   Does not apply q1800   Continuous Infusions: . sodium chloride 100 mL/hr at 12/08/13 1148      Time spent: > 35 minutes    [facility] Pager [phone] If 7PM-7AM, please contact night-coverage at [email or web address], password [facility] 12/09/2013, 3:45 PM  LOS: 2 days

## 2013-12-09 NOTE — Progress Notes (Signed)
PHARMACY BRIEF NOTE - Drug Level Result  Consult for:  Vancomycin Indication:  Sepsis, HCAP  The current Vancomycin dose is 1000 mg IV every 12 hours.  The trough level drawn prior to the 8 pm dose tonight is reported as 11.3 mcg/ml, below the desired range 15-20 mcg/ml.  Plan:  Give an additional 500 mg dose tonight for a loading dose effect.  Increase the maintenance dose to 1250 mg every 12 hours beginning 5/16.  Maggie ValleyPh. 12/09/2013 9:20 PM.

## 2013-12-09 NOTE — Progress Notes (Signed)
Family at bedside. Family states patient tends to have a chronic UTI from his chronic catheter. Family also stated that patient had PNA recently and because of his constant UTIs and chronic lung issues it's been difficult to treat his PNA due to antibiotic resistance. Family states they know when it is bad, because he will "talk out of his head." Will continue to monitor patient.

## 2013-12-09 NOTE — Progress Notes (Signed)
ANTIBIOTIC CONSULT NOTE - FOLLOW UP  Pharmacy Consult for Vancomycin/Renal dose adjustment to antibiotics Indication: sepsis, HCAP  No Known Allergies  Patient Measurements: Height: 5\' 4"  (162.6 cm) Weight: 238 lb 8.6 oz (108.2 kg) IBW/kg (Calculated) : 59.2  Vital Signs: Temp: 98.1 F (36.7 C) (05/15 0610) Temp src: Oral (05/15 0610) BP: 113/69 mmHg (05/15 0610) Pulse Rate: 66 (05/15 0610) Intake/Output from previous day: 05/14 0701 - 05/15 0700 In: 3800 [P.O.:1200; I.V.:2000; IV Piggyback:600] Out: 2850 [Urine:2850] Intake/Output from this shift: Total I/O In: 360 [P.O.:360] Out: -   Labs:  Recent Labs  12/07/13 2145 12/08/13 0430  WBC 13.2* 21.1*  HGB 16.7 14.2  PLT 207 244  CREATININE 0.55 0.56   Estimated Creatinine Clearance: 114.9 ml/min (by C-G formula based on Cr of 0.56). 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 CXR shows infiltrate.  Pharmacy consulted to manage vancomycin and zosyn.  5/14 >> vanc >> 5/14 >> cefepime >>  Tmax: 99.8 WBCs: 21.1 Renal: SCr 0.56, stable. CrCl>100N/CG although likely overestimate  5/13 blood x 2: ngtd 5/13 urine: reincubated for better growth 5/14 MRSA screen (+) 5/13 Legionella ur ag: negative 5/13 Strep pneumo ur ag: negative  Day #2 Vancomycin 1gm IV q12hr, Cefepime 1gm IV q8hr for sepsis 2/2 HCAP.  Goal of Therapy:  Vancomycin trough level 15-20 mcg/ml  Plan:  1.  Check Vancomycin dose tonight.  Will adjust dose based on level. 2.  Continue Cefepime 1g IV q8h. 3.  F/u cultures, de-escalation of abx, clinical  course.  Donald Prose Sera Hitsman 12/09/2013,10:52 AM

## 2013-12-10 LAB — CBC
HCT: 38.4 % — ABNORMAL LOW (ref 39.0–52.0)
Hemoglobin: 12.7 g/dL — ABNORMAL LOW (ref 13.0–17.0)
MCH: 27.6 pg (ref 26.0–34.0)
MCHC: 33.1 g/dL (ref 30.0–36.0)
MCV: 83.5 fL (ref 78.0–100.0)
Platelets: 197 K/uL (ref 150–400)
RBC: 4.6 MIL/uL (ref 4.22–5.81)
RDW: 14.7 % (ref 11.5–15.5)
WBC: 6.4 K/uL (ref 4.0–10.5)

## 2013-12-10 LAB — BASIC METABOLIC PANEL
BUN: 6 mg/dL (ref 6–23)
CALCIUM: 8.4 mg/dL (ref 8.4–10.5)
CO2: 27 mEq/L (ref 19–32)
Chloride: 103 mEq/L (ref 96–112)
Creatinine, Ser: 0.54 mg/dL (ref 0.50–1.35)
GLUCOSE: 102 mg/dL — AB (ref 70–99)
Potassium: 3.5 mEq/L — ABNORMAL LOW (ref 3.7–5.3)
SODIUM: 139 meq/L (ref 137–147)

## 2013-12-10 LAB — URINE CULTURE: Colony Count: 85000

## 2013-12-10 LAB — PROTIME-INR
INR: 2.49 — ABNORMAL HIGH (ref 0.00–1.49)
Prothrombin Time: 26.1 s — ABNORMAL HIGH (ref 11.6–15.2)

## 2013-12-10 MED ORDER — WARFARIN SODIUM 2 MG PO TABS
2.0000 mg | ORAL_TABLET | Freq: Once | ORAL | Status: DC
  Filled 2013-12-10: qty 1

## 2013-12-10 MED ORDER — HYDROCODONE-ACETAMINOPHEN 5-325 MG PO TABS: 1.0000 | ORAL_TABLET | ORAL | Status: DC

## 2013-12-10 MED ORDER — OXYCODONE HCL 15 MG PO TABS: 15.0000 mg | ORAL_TABLET | Freq: Three times a day (TID) | ORAL | Status: DC | PRN

## 2013-12-10 MED ORDER — LEVOFLOXACIN 500 MG PO TABS: 500.0000 mg | ORAL_TABLET | Freq: Every day | ORAL | Status: DC

## 2013-12-10 NOTE — Progress Notes (Signed)
ANTICOAGULATION CONSULT NOTE - Follow Up Consult  Pharmacy Consult for Warfarin Indication: h/o PE  No Known Allergies  Patient Measurements: Height: 5\' 4"  (162.6 cm) Weight: 238 lb 8.6 oz (108.2 kg) IBW/kg (Calculated) : 59.2   Vital Signs: Temp: 98.2 F (36.8 C) (05/16 0500) Temp src: Oral (05/16 0500) BP: 136/74 mmHg (05/16 0500) Pulse Rate: 72 (05/16 0500)  Labs:  Recent Labs  12/07/13 2145  12/07/13 2221 12/08/13 0430 12/09/13 0355 12/10/13 0656  HGB 16.7  --   --  14.2  --  12.7*  HCT 50.6  --   --  43.4  --  38.4*  PLT 207  --   --  244  --  197  APTT  --   --  52*  --   --   --   LABPROT  --   < > 28.6* 32.6* 32.5* 26.1*  INR  --   < > 2.81* 3.33* 3.32* 2.49*  CREATININE 0.55  --   --  0.56  --  0.54  < > = values in this interval not displayed.  Estimated Creatinine Clearance: 114.9 ml/min (by C-G formula based on Cr of 0.54).    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  Pharmacy consulted to manage warfarin.   Home warfarin dose reported as 3.5 mg daily.  Last dose 5/13 PTA.  INR decreased into therapeutic range (2.49) today.  Warfarin dose held 5/14-5/15 due to supratherapeutic levels  Hgb/Plts WNL 5/14  Patient eating 100% of meals.  Goal of Therapy:  INR 2-3 Monitor platelets by anticoagulation protocol: Yes   Plan:   Resume small dose of warfarin today - 2mg   Daily PT/INR  Peggyann Juba, PharmD, BCPS Pager: [phone] 12/10/2013,11:36 AM

## 2013-12-10 NOTE — Progress Notes (Signed)
Pt refused blood work at 0400. Phlebotomy to return at approximately 0630 to draw.

## 2013-12-10 NOTE — Progress Notes (Signed)
Clinical Social Work Department BRIEF PSYCHOSOCIAL ASSESSMENT 12/10/2013  Patient:  Samuel [name], Samuel [name]     Account Number:  [id number]     Admit date:  12/07/2013  Clinical Social Worker:  Levie Heritage  Date/Time:  12/10/2013 02:05 PM  Referred by:  Physician  Date Referred:  12/10/2013 Referred for  SNF Placement   Other Referral:   Interview type:  Patient Other interview type:    PSYCHOSOCIAL DATA Living Status:  FACILITY Admitted from facility:  [facility] Level of care:  [place] Primary support name:  [name] Primary support relationship to patient:  CHILD, ADULT Degree of support available:   adequate    CURRENT CONCERNS Current Concerns  Post-Acute Placement   Other Concerns:    SOCIAL WORK ASSESSMENT / PLAN Per MD, Pt ready for d/c.    Met with Pt to discuss d/c plans.  Pt alert and oriented. Pt confirmed that he is fron [place] and that he has been living there for a period of time.  Pt stated that he is happy there and that he'd like to return.    CSW thanked Pt for his time.    Spoke with Shirlean Mylar at [facility].  Shirlean Mylar stated that facility is ready to accept Pt and she asked that [facility] fax d/c summary.    D/c summary faxed.  Confirmed receipt.    CSW arranged for ambulance transport.    Pt to be d/c'd.   Assessment/plan status:  No Further Intervention Required Other assessment/ plan:   Information/referral to community resources:   n/--from facility    PATIENT'S/FAMILY'S RESPONSE TO PLAN OF CARE: Pt was calm and cooperative.  Pt eager to return back to [place] and thanked CSW for coordinating his release from the hospital.   Bernita Raisin, [place] Work [phone]

## 2013-12-10 NOTE — Progress Notes (Signed)
Pt refused lab work this AM.

## 2013-12-10 NOTE — Discharge Summary (Signed)
Physician Discharge Summary  Samuel [name] [id number] DOB: 06/16/1956 DOA: 12/07/2013  PCP: [name], MD  Admit date: 12/07/2013 Discharge date: 12/10/2013  Time spent: > 35 minutes  Recommendations for Outpatient Follow-up:  1. Please be sure to check INR within the next 3-4 days 2. Patient will be discharged on Levaquin to complete 8 days total treatment with antibiotics 3. Patient had mild hyperglycemia please consider working up for diabetes. He refused hemoglobin A1c draw this admission  Discharge Diagnoses:  Active Problems:   Chronic pain   Sepsis   UTI (lower urinary tract infection)   HTN (hypertension)   DVT of lower extremity (deep venous thrombosis)   Sacral wound   HCAP (healthcare-associated pneumonia)   Discharge Condition: Stable  Diet recommendation: Heart healthy  Filed Weights   12/07/13 2352 12/08/13 0239  Weight: 106.595 kg (235 lb) 108.2 kg (238 lb 8.6 oz)    History of present illness:  Samuel [name] is a 58 y.o. male with a Past Medical History left AKA, normally wheelchair-bound, recent history of submassive PE on chronic anticoagulation, history of paraplegia following a spinal cord injury who presented to the ED after a fall. And presented short of breath with chest x-ray positive for pneumonia.   Hospital Course:  Sepsis  - Most likely secondary to Hcap  - Resolved. Patient was on broad spectrum IV antibiotics and improved on this regimen - WBC within normal limits and patient afebrile.  HCAP (healthcare-associated pneumonia)  - Initially patient started on cefepime and vancomycin  - HIV antibody negative/nonreactive, urine Legionella/strep negative  - Patient will be discharged on oral Levaquin. I discussed treatment plan with patient and he is currently in agreement. He assures me he will continue his oral antibiotics.  Active Problems:  Chronic pain  - Continue home pain regimen on discharge  HTN (hypertension)   Sacral wound  - Patient to continue routine care with routine followup with managing physician. - Wound consult notes describe the following recommendations: Dressing procedure/placement/frequency: I have implemented a preventive care plan using a soft silicone foam dressing to the sacral area with twice weekly and PRN changes.    DVT  - INR at target - Coumadin to be continued on discharge would recommend reassessing INR in 3-4 days   Procedures:  CT of head reported no acute abnormality  Consultations:  None  Discharge Exam: Filed Vitals:   12/10/13 0500  BP: 136/74  Pulse: 72  Temp: 98.2 F (36.8 C)  Resp: 20    General: Patient in no acute distress, alert and awake Cardiovascular: Regular rate and rhythm no murmurs or rubs Respiratory: No audible wheezes, breath sounds bilaterally, no increased work of breathing on room air  Discharge Instructions You were cared for by a hospitalist during your hospital stay. If you have any questions about your discharge medications or the care you received while you were in the hospital after you are discharged, you can call the unit and asked to speak with the hospitalist on call if the hospitalist that took care of you is not available. Once you are discharged, your primary care physician will handle any further medical issues. Please note that NO REFILLS for any discharge medications will be authorized once you are discharged, as it is imperative that you return to your primary care physician (or establish a relationship with a primary care physician if you do not have one) for your aftercare needs so that they can reassess your need for medications and monitor your  lab values.  Discharge Instructions   Call MD for:  difficulty breathing, headache or visual disturbances    Complete by:  As directed      Call MD for:  severe uncontrolled pain    Complete by:  As directed      Call MD for:  temperature >100.4    Complete by:  As  directed      Diet - low sodium heart healthy    Complete by:  As directed      Increase activity slowly    Complete by:  As directed             Medication List    STOP taking these medications       pregabalin 75 MG capsule  Commonly known as:  LYRICA      TAKE these medications       acetaminophen 325 MG tablet  Commonly known as:  TYLENOL  Take 650 mg by mouth every 6 (six) hours as needed (pain). Do not exceed 4 gms of Tylenol in 24 hours     albuterol (2.5 MG/3ML) 0.083% nebulizer solution  Commonly known as:  PROVENTIL  Take 2.5 mg by nebulization as needed for wheezing or shortness of breath.     alum & mag hydroxide-simeth 200-200-20 MG/5ML suspension  Commonly known as:  MAALOX/MYLANTA  Take 20 mLs by mouth 2 (two) times daily as needed for heartburn.     amLODipine 5 MG tablet  Commonly known as:  NORVASC  Take 1 tablet (5 mg total) by mouth at bedtime.     atorvastatin 10 MG tablet  Commonly known as:  LIPITOR  Take 10 mg by mouth at bedtime.     baclofen 20 MG tablet  Commonly known as:  LIORESAL  Take 20 mg by mouth 3 (three) times daily. 9am, 2pm, 9pm     calcium carbonate 500 MG chewable tablet  Commonly known as:  TUMS - dosed in mg elemental calcium  Chew 1 tablet by mouth as needed for indigestion or heartburn (reflux).     clonazePAM 0.5 MG tablet  Commonly known as:  KLONOPIN  Take 0.5 mg by mouth every 12 (twelve) hours as needed for anxiety.     clonazePAM 1 MG tablet  Commonly known as:  KLONOPIN  Take 1 mg by mouth 4 (four) times daily. Take one tablet by mouth three times daily for anxiety     DULoxetine 30 MG capsule  Commonly known as:  CYMBALTA  Take 30 mg by mouth at bedtime.     fentaNYL 75 MCG/HR  Commonly known as:  DURAGESIC - dosed mcg/hr  Place 1 patch (75 mcg total) onto the skin every 3 (three) days. *Remove old Patch*Rotate Site*     ferrous sulfate 325 (65 FE) MG tablet  Take 1 tablet (325 mg total) by mouth  daily with breakfast.     folic acid 1 MG tablet  Commonly known as:  FOLVITE  Take 1 mg by mouth daily.     GENERLAC 10 GM/15ML Soln  Generic drug:  lactulose (encephalopathy)  Take 20 g by mouth 4 (four) times daily. 9am, 1pm, 5pm, 9pm     HYDROcodone-acetaminophen 5-325 MG per tablet  Commonly known as:  NORCO/VICODIN  Take 1-2 tablets by mouth See admin instructions. Take one tablet by mouth every 6 hours as needed for moderate pain; Take two tablet by mouth every 6 hours as needed for severe pain  lamoTRIgine 200 MG tablet  Commonly known as:  LAMICTAL  Take 200 mg by mouth 2 (two) times daily. To stabilize bipolar mood     levofloxacin 500 MG tablet  Commonly known as:  LEVAQUIN  Take 1 tablet (500 mg total) by mouth daily.     meclizine 25 MG tablet  Commonly known as:  ANTIVERT  Take 1 tablet (25 mg total) by mouth 3 (three) times daily as needed for dizziness.     Melatonin 3 MG Caps  Take 3 mg by mouth at bedtime.     nitroGLYCERIN 0.4 MG SL tablet  Commonly known as:  NITROSTAT  Place 0.4 mg under the tongue every 5 (five) minutes as needed for chest pain. x3 doses as needed for chest pain     oxybutynin 5 MG 24 hr tablet  Commonly known as:  DITROPAN-XL  Take 5 mg by mouth daily.     oxyCODONE 15 MG immediate release tablet  Commonly known as:  ROXICODONE  Take 1 tablet (15 mg total) by mouth 3 (three) times daily as needed (moderate to severe pain).     pantoprazole 40 MG tablet  Commonly known as:  PROTONIX  Take 40 mg by mouth 2 (two) times daily. 9am and 5pm     potassium chloride 10 MEQ CR capsule  Commonly known as:  MICRO-K  Take 10 mEq by mouth daily.     promethazine 12.5 MG tablet  Commonly known as:  PHENERGAN  Take 12.5 mg by mouth every 6 (six) hours as needed for nausea or vomiting.     QUEtiapine 50 MG tablet  Commonly known as:  SEROQUEL  Take 50 mg by mouth every 12 (twelve) hours.     senna-docusate 8.6-50 MG per tablet   Commonly known as:  Senokot-S  Take 1 tablet by mouth daily.     SYSTANE OP  Place 2 drops into both eyes 3 (three) times daily. 8am, 2pm, 8pm     vitamin B-12 1000 MCG tablet  Commonly known as:  CYANOCOBALAMIN  Take 1,000 mcg by mouth daily.     Vitamin D3 50000 UNITS Caps  Take 50,000 Units by mouth every 30 (thirty) days. On the 11th of each month     warfarin 2.5 MG tablet  Commonly known as:  COUMADIN  Take 2.5 mg by mouth daily. Take with 1mg  for total dose of 3.5mg      warfarin 1 MG tablet  Commonly known as:  COUMADIN  Take 1 mg by mouth daily. Take with 2.5mg  for total dose of 3.5mg        No Known Allergies    The results of significant diagnostics from this hospitalization (including imaging, microbiology, ancillary and laboratory) are listed below for reference.    Significant Diagnostic Studies: Dg Chest 1 View  12/07/2013   CLINICAL DATA:  Shortness of breath  EXAM: CHEST - 1 VIEW  COMPARISON:  09/20/2013  FINDINGS: Elevation of the right hemidiaphragm is again seen. The cardiac shadow is mildly enlarged. Patchy infiltrative changes are noted in the left lung base. No acute bony abnormality is seen.  IMPRESSION: Patchy left basilar infiltrate.   Electronically Signed   By: Inez Catalina M.D.   On: 12/07/2013 23:54   Ct Head Wo Contrast  12/07/2013   CLINICAL DATA:  Recent traumatic injury and pain  EXAM: CT HEAD WITHOUT CONTRAST  TECHNIQUE: Contiguous axial images were obtained from the base of the skull through the vertex without intravenous  contrast.  COMPARISON:  None.  FINDINGS: The bony calvarium is intact. No gross soft tissue abnormality is noted. The ventricles and cortical sulci are within normal limits for the patient's age. No findings to suggest acute hemorrhage, acute infarction or space-occupying mass lesion are noted.  IMPRESSION: No acute abnormality noted.   Electronically Signed   By: Inez Catalina M.D.   On: 12/07/2013 23:59   Dg Shoulder  Left  12/07/2013   CLINICAL DATA:  Left shoulder pain following injury  EXAM: LEFT SHOULDER - 2+ VIEW  COMPARISON:  None.  FINDINGS: No acute fracture or dislocation is noted. Mild degenerative changes of the glenohumeral and acromioclavicular joint are seen. No other focal abnormality is noted.  IMPRESSION: Degenerative change without acute abnormality.   Electronically Signed   By: Inez Catalina M.D.   On: 12/07/2013 23:58   Dg Knee Complete 4 Views Right  12/07/2013   CLINICAL DATA:  Recent traumatic injury with pain  EXAM: RIGHT KNEE - COMPLETE 4+ VIEW  COMPARISON:  08/28/2013  FINDINGS: Postsurgical changes are noted in the distal femur. Posttraumatic changes are noted in the proximal tibia and fibula. No acute fracture or dislocation is seen. No joint effusion is noted. No hardware failure is noted.  IMPRESSION: Chronic changes without acute abnormality.   Electronically Signed   By: Inez Catalina M.D.   On: 12/07/2013 23:55    Microbiology: Recent Results (from the past 240 hour(s))  CULTURE, BLOOD (ROUTINE X 2)     Status: None   Collection Time    12/07/13  9:45 PM      Result Value Ref Range Status   Specimen Description BLOOD LEFT HAND   Final   Special Requests BOTTLES DRAWN AEROBIC ONLY 1ML   Final   Culture  Setup Time     Final   Value: 12/08/2013 01:05     Performed at [facility]   Culture     Final   Value:        BLOOD CULTURE RECEIVED NO GROWTH TO DATE CULTURE WILL BE HELD FOR 5 DAYS BEFORE ISSUING A FINAL NEGATIVE REPORT     Performed at [facility]   Report Status PENDING   Incomplete  CULTURE, BLOOD (ROUTINE X 2)     Status: None   Collection Time    12/07/13  9:54 PM      Result Value Ref Range Status   Specimen Description BLOOD RIGHT HAND   Final   Special Requests BOTTLES DRAWN AEROBIC ONLY 1ML   Final   Culture  Setup Time     Final   Value: 12/08/2013 01:05     Performed at [facility]   Culture     Final   Value:        BLOOD  CULTURE RECEIVED NO GROWTH TO DATE CULTURE WILL BE HELD FOR 5 DAYS BEFORE ISSUING A FINAL NEGATIVE REPORT     Performed at [facility]   Report Status PENDING   Incomplete  URINE CULTURE     Status: None   Collection Time    12/07/13 11:53 PM      Result Value Ref Range Status   Specimen Description URINE, CATHETERIZED   Final   Special Requests NONE   Final   Culture  Setup Time     Final   Value: 12/08/2013 04:25     Performed at [place]     Final  Value: 85,000 COLONIES/ML     Performed at [facility]   Culture     Final   Value: [place]     Performed at [facility]   Report Status PENDING   Incomplete  URINE CULTURE     Status: None   Collection Time    12/07/13 11:53 PM      Result Value Ref Range Status   Specimen Description URINE, CATHETERIZED   Final   Special Requests Normal   Final   Culture  Setup Time     Final   Value: 12/08/2013 10:03     Performed at [place]     Final   Value: >=100,000 COLONIES/ML     Performed at [facility]   Culture     Final   Value: Multiple bacterial morphotypes present, none predominant. Suggest appropriate recollection if clinically indicated.     Performed at [facility]   Report Status 12/09/2013 FINAL   Final  MRSA PCR SCREENING     Status: Abnormal   Collection Time    12/08/13  3:23 AM      Result Value Ref Range Status   MRSA by PCR POSITIVE (*) NEGATIVE Final   Comment: RESULT CALLED TO, READ BACK BY AND VERIFIED WITH:     MARSHALL,REBECCA AT [phone] BY TIBBITTS,K                The GeneXpert MRSA Assay (FDA     approved for NASAL specimens     only), is one component of a     comprehensive MRSA colonization     surveillance program. It is not     intended to diagnose MRSA     infection nor to guide or     monitor treatment for     MRSA infections.     Labs: Basic Metabolic Panel:  Recent  Labs Lab 12/07/13 2145 12/08/13 0430 12/10/13 0656  NA 141 141 139  K 4.5 4.1 3.5*  CL 103 102 103  CO2 21 24 27   GLUCOSE 105* 137* 102*  BUN 6 6 6   CREATININE 0.55 0.56 0.54  CALCIUM 9.3 8.4 8.4   Liver Function Tests:  Recent Labs Lab 12/07/13 2145 12/08/13 0430  AST 24 22  ALT 20 17  ALKPHOS 102 82  BILITOT 0.5 0.6  PROT 7.9 6.7  ALBUMIN 3.8 3.1*   No results found for this basename: LIPASE, AMYLASE,  in the last 168 hours No results found for this basename: AMMONIA,  in the last 168 hours CBC:  Recent Labs Lab 12/07/13 2145 12/08/13 0430 12/10/13 0656  WBC 13.2* 21.1* 6.4  NEUTROABS 10.3* 19.2*  --   HGB 16.7 14.2 12.7*  HCT 50.6 43.4 38.4*  MCV 84.2 84.6 83.5  PLT 207 244 197   Cardiac Enzymes: No results found for this basename: CKTOTAL, CKMB, CKMBINDEX, TROPONINI,  in the last 168 hours BNP: BNP (last 3 results)  Recent Labs  09/19/13 1415 12/07/13 2221  PROBNP 123.8 56.9   CBG: No results found for this basename: GLUCAP,  in the last 168 hours     Signed:  Velvet Bathe  [facility] 12/10/2013, 12:22 PM

## 2013-12-10 NOTE — Progress Notes (Signed)
Report called to [place].Laurel Dimmer Chantel Teti

## 2013-12-10 NOTE — ED Provider Notes (Signed)
Medical screening examination/treatment/procedure(s) were conducted as a shared visit with non-physician practitioner(s) and myself.  I personally evaluated the patient during the encounter.  Pt presented as a fall out of bed from his nursing facility.  Hx of paraplegia from a spinal cord injury.  Noted to be tachcyardic and hypoxic.  Concern for SIRS/sepsis.  Possible HCAP.  ABX initiated.  Admitted for further treatment.   Kathalene Frames, MD 12/10/13 [phone]

## 2013-12-13 ENCOUNTER — Non-Acute Institutional Stay (SKILLED_NURSING_FACILITY): Payer: PRIVATE HEALTH INSURANCE | Admitting: Internal Medicine

## 2013-12-13 DIAGNOSIS — L8993 Pressure ulcer of unspecified site, stage 3: Secondary | ICD-10-CM

## 2013-12-13 DIAGNOSIS — J189 Pneumonia, unspecified organism: Secondary | ICD-10-CM

## 2013-12-13 DIAGNOSIS — Z7901 Long term (current) use of anticoagulants: Secondary | ICD-10-CM

## 2013-12-13 DIAGNOSIS — N319 Neuromuscular dysfunction of bladder, unspecified: Secondary | ICD-10-CM

## 2013-12-13 DIAGNOSIS — L8990 Pressure ulcer of unspecified site, unspecified stage: Secondary | ICD-10-CM
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
## 2013-12-14 ENCOUNTER — Other Ambulatory Visit: Payer: Self-pay | Admitting: *Deleted

## 2013-12-14 LAB — CULTURE, BLOOD (ROUTINE X 2)
CULTURE: NO GROWTH
Culture: NO GROWTH

## 2013-12-14 MED ORDER — PREGABALIN 75 MG PO CAPS: ORAL_CAPSULE | ORAL | Status: DC

## 2013-12-14 NOTE — Telephone Encounter (Signed)
[facility] 

## 2013-12-27 ENCOUNTER — Other Ambulatory Visit: Payer: Self-pay | Admitting: *Deleted

## 2013-12-27 MED ORDER — CLONAZEPAM 1 MG PO TABS: ORAL_TABLET | ORAL | Status: DC

## 2013-12-27 NOTE — Telephone Encounter (Signed)
[facility] 

## 2013-12-29 ENCOUNTER — Other Ambulatory Visit: Payer: Self-pay | Admitting: *Deleted

## 2013-12-29 MED ORDER — FENTANYL 75 MCG/HR TD PT72: 75.0000 ug | MEDICATED_PATCH | TRANSDERMAL | Status: DC

## 2013-12-29 NOTE — Telephone Encounter (Signed)
[facility] 

## 2014-01-09 ENCOUNTER — Institutional Professional Consult (permissible substitution): Payer: PRIVATE HEALTH INSURANCE | Admitting: Internal Medicine

## 2014-01-10 ENCOUNTER — Non-Acute Institutional Stay (SKILLED_NURSING_FACILITY): Payer: PRIVATE HEALTH INSURANCE | Admitting: Internal Medicine

## 2014-01-10 DIAGNOSIS — L8993 Pressure ulcer of unspecified site, stage 3: Secondary | ICD-10-CM

## 2014-01-10 DIAGNOSIS — L8990 Pressure ulcer of unspecified site, unspecified stage: Secondary | ICD-10-CM

## 2014-01-10 DIAGNOSIS — K219 Gastro-esophageal reflux disease without esophagitis: Secondary | ICD-10-CM

## 2014-01-10 DIAGNOSIS — J189 Pneumonia, unspecified organism: Secondary | ICD-10-CM

## 2014-01-13 ENCOUNTER — Ambulatory Visit (INDEPENDENT_AMBULATORY_CARE_PROVIDER_SITE_OTHER): Payer: PRIVATE HEALTH INSURANCE | Admitting: Internal Medicine

## 2014-01-13 ENCOUNTER — Encounter: Payer: Self-pay | Admitting: Internal Medicine

## 2014-01-13 VITALS — BP 110/62 | HR 73 | Temp 97.7°F | Wt 240.0 lb

## 2014-01-13 DIAGNOSIS — R05 Cough: Secondary | ICD-10-CM

## 2014-01-13 DIAGNOSIS — R059 Cough, unspecified: Secondary | ICD-10-CM

## 2014-01-13 DIAGNOSIS — I2699 Other pulmonary embolism without acute cor pulmonale: Secondary | ICD-10-CM

## 2014-01-13 NOTE — Patient Instructions (Signed)
Continue protonix Take 30- 60 min before your first and last meals of the day   Add  Pepcid 20 ac mg one bedtime plus chlorpheniramine 4 mg x 2 at bedtime (both available over the counter)  until cough is completely gone for at least a week without the need for cough suppression  GERD (REFLUX)  is an extremely common cause of respiratory symptoms, many times with no significant heartburn at all.    It can be treated with medication, but also with lifestyle changes including avoidance of late meals, excessive alcohol, smoking cessation, and avoid fatty foods, chocolate, peppermint, colas, red wine, and acidic juices such as orange juice.  NO MINT OR MENTHOL PRODUCTS SO NO COUGH DROPS  USE HARD CANDY INSTEAD (jolley ranchers or Stover's or Lifesavers (all available in sugarless versions) NO OIL BASED VITAMINS - use powdered substitutes.  Return   if still coughing or abnormal cxr @ 4 weeks
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
## 2014-01-16 DIAGNOSIS — R059 Cough, unspecified: Secondary | ICD-10-CM | POA: Insufficient documentation

## 2014-01-16 DIAGNOSIS — R05 Cough: Secondary | ICD-10-CM | POA: Insufficient documentation
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
## 2014-01-16 NOTE — Assessment & Plan Note (Signed)
cxr's suggest infartion in L base which is not uncommon and does not usually cause a cough  Unable to repeat cxr here since he can't stand for one so rec repeat either plain cxr or better a CTa chest in 6 weeks

## 2014-01-16 NOTE — Assessment & Plan Note (Signed)
The most common causes of chronic cough in immunocompetent adults include the following: upper airway cough syndrome (UACS), previously referred to as postnasal drip syndrome (PNDS), which is caused by variety of rhinosinus conditions; (2) asthma; (3) GERD; (4) chronic bronchitis from cigarette smoking or other inhaled environmental irritants; (5) nonasthmatic eosinophilic bronchitis; and (6) bronchiectasis.   These conditions, singly or in combination, have accounted for up to 94% of the causes of chronic cough in prospective studies.   Other conditions have constituted no >6% of the causes in prospective studies These have included bronchogenic carcinoma, chronic interstitial pneumonia, sarcoidosis, left ventricular failure, ACEI-induced cough, and aspiration from a condition associated with pharyngeal dysfunction.    Chronic cough is often simultaneously caused by more than one condition. A single cause has been found from 38 to 82% of the time, multiple causes from 18 to 62%. Multiply caused cough has been the result of three diseases up to 42% of the time.       Based on hx and exam, this is most likely:  Classic Upper airway cough syndrome, so named because it's frequently impossible to sort out how much is  CR/sinusitis with freq throat clearing (which can be related to primary GERD)   vs  causing  secondary (" extra esophageal")  GERD from wide swings in gastric pressure that occur with throat clearing, often  promoting self use of mint and menthol lozenges that reduce the lower esophageal sphincter tone and exacerbate the problem further in a cyclical fashion.   These are the same pts (now being labeled as having "irritable larynx syndrome" by some cough centers) who not infrequently have a history of having failed to tolerate ace inhibitors,  dry powder inhalers or biphosphonates or report having atypical reflux symptoms that don't respond to standard doses of PPI , and are easily confused as  having aecopd or asthma flares by even experienced allergists/ pulmonologists.   The first step is to maximize acid suppression and eliminate cyclical coughing then regroup if the cough persists.  See instructions for specific recommendations which were reviewed directly with the patient who was given a copy with highlighter outlining the key components.     

## 2014-01-24 ENCOUNTER — Encounter: Payer: Self-pay | Admitting: Internal Medicine

## 2014-01-30 ENCOUNTER — Other Ambulatory Visit: Payer: Self-pay | Admitting: *Deleted

## 2014-01-30 MED ORDER — FENTANYL 75 MCG/HR TD PT72: 75.0000 ug | MEDICATED_PATCH | TRANSDERMAL | Status: DC

## 2014-01-31 ENCOUNTER — Ambulatory Visit (INDEPENDENT_AMBULATORY_CARE_PROVIDER_SITE_OTHER): Payer: Medicare Other | Admitting: Internal Medicine

## 2014-01-31 ENCOUNTER — Encounter: Payer: Self-pay | Admitting: Internal Medicine

## 2014-01-31 VITALS — BP 106/62 | HR 62 | Temp 98.1°F

## 2014-01-31 DIAGNOSIS — R05 Cough: Secondary | ICD-10-CM

## 2014-01-31 DIAGNOSIS — R059 Cough, unspecified: Secondary | ICD-10-CM

## 2014-01-31 MED ORDER — AMOXICILLIN-POT CLAVULANATE 875-125 MG PO TABS: 1.0000 | ORAL_TABLET | Freq: Two times a day (BID) | ORAL | Status: DC

## 2014-01-31 MED ORDER — PREDNISONE 10 MG PO TABS: ORAL_TABLET | ORAL | Status: DC

## 2014-01-31 NOTE — Patient Instructions (Addendum)
Augmentin 875 mg take one pill twice daily  X 10 days - take at breakfast and supper with large glass of water.  It would help reduce the usual side effects (diarrhea and yeast infections) if you ate cultured yogurt at lunch.   Prednisone 10 mg take  4 each am x 2 days,   2 each am x 2 days,  1 each am x 2 days and stop  Please see patient coordinator before you leave today  to schedule sinus CT and Esophagram and we will contact Dr Dellia Nims with results

## 2014-01-31 NOTE — Assessment & Plan Note (Addendum)
DDX of  difficult airways management all start with A and  include Adherence, Ace Inhibitors, Acid Reflux, Active Sinus Disease, Alpha 1 Antitripsin deficiency, Anxiety masquerading as Airways dz,  ABPA,  allergy(esp in young), Aspiration (esp in elderly), Adverse effects of DPI,  Active smokers, plus two Bs  = Bronchiectasis and Beta blocker use..and one C= CHF   ? Active sinus dz > doubt because nothing obvious on CT head 2 months ago > rx augmentin x 10 days and check sinus Ct  ? Aspiration > check barium swallow.  ? Allergy/asthma > Prednisone 10 mg take  4 each am x 2 days,   2 each am x 2 days,  1 each am x 2 days and stop if marked improvement then consider laba/ics neb bid   ? chf > echo ok 09/20/2013
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
## 2014-02-01 ENCOUNTER — Non-Acute Institutional Stay (SKILLED_NURSING_FACILITY): Payer: PRIVATE HEALTH INSURANCE | Admitting: Internal Medicine

## 2014-02-01 DIAGNOSIS — R059 Cough, unspecified: Secondary | ICD-10-CM

## 2014-02-01 DIAGNOSIS — J9801 Acute bronchospasm: Secondary | ICD-10-CM

## 2014-02-01 DIAGNOSIS — K219 Gastro-esophageal reflux disease without esophagitis: Secondary | ICD-10-CM

## 2014-02-01 DIAGNOSIS — R05 Cough: Secondary | ICD-10-CM
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
## 2014-02-09 ENCOUNTER — Ambulatory Visit (HOSPITAL_COMMUNITY): Admission: RE | Admit: 2014-02-09 | Payer: PRIVATE HEALTH INSURANCE | Source: Ambulatory Visit

## 2014-02-09 ENCOUNTER — Other Ambulatory Visit (HOSPITAL_COMMUNITY): Payer: Medicare Other

## 2014-02-10 ENCOUNTER — Ambulatory Visit: Payer: Medicare Other | Admitting: Internal Medicine

## 2014-02-16 ENCOUNTER — Ambulatory Visit (HOSPITAL_COMMUNITY)
Admission: RE | Admit: 2014-02-16 | Discharge: 2014-02-16 | Disposition: A | Discharge status: Home / Self Care | Payer: PRIVATE HEALTH INSURANCE | Source: Ambulatory Visit | Attending: Internal Medicine | Admitting: Internal Medicine

## 2014-02-16 DIAGNOSIS — R059 Cough, unspecified: Secondary | ICD-10-CM | POA: Insufficient documentation

## 2014-02-16 DIAGNOSIS — K219 Gastro-esophageal reflux disease without esophagitis: Secondary | ICD-10-CM | POA: Insufficient documentation

## 2014-02-16 DIAGNOSIS — R05 Cough: Secondary | ICD-10-CM

## 2014-02-16 DIAGNOSIS — R0982 Postnasal drip: Secondary | ICD-10-CM | POA: Insufficient documentation

## 2014-02-16 DIAGNOSIS — R1310 Dysphagia, unspecified: Secondary | ICD-10-CM | POA: Insufficient documentation

## 2014-02-17 ENCOUNTER — Encounter: Payer: Self-pay | Admitting: Internal Medicine

## 2014-02-17 NOTE — Progress Notes (Signed)
Quick Note:  Spoke with pt and notified of results per Dr. Wert. Pt verbalized understanding and denied any questions.  ______ 

## 2014-02-17 NOTE — Progress Notes (Signed)
Quick Note:  Spoke with the pt's nurse Estill Bamberg and notified of results/recs  I have faxed order for GI eval for Dr Dellia Nims to arrange Order was faxed to [phone] ______

## 2014-02-28 ENCOUNTER — Other Ambulatory Visit (HOSPITAL_BASED_OUTPATIENT_CLINIC_OR_DEPARTMENT_OTHER): Payer: Self-pay | Admitting: Internal Medicine

## 2014-02-28 DIAGNOSIS — I82409 Acute embolism and thrombosis of unspecified deep veins of unspecified lower extremity: Secondary | ICD-10-CM

## 2014-03-01 ENCOUNTER — Other Ambulatory Visit: Payer: Self-pay | Admitting: *Deleted

## 2014-03-01 ENCOUNTER — Other Ambulatory Visit: Payer: Self-pay | Admitting: Radiology

## 2014-03-01 MED ORDER — FENTANYL 75 MCG/HR TD PT72: 75.0000 ug | MEDICATED_PATCH | TRANSDERMAL | Status: DC

## 2014-03-01 NOTE — Telephone Encounter (Signed)
[facility] 

## 2014-03-02 ENCOUNTER — Encounter (HOSPITAL_COMMUNITY): Payer: Self-pay | Admitting: Pharmacy Technician

## 2014-03-06 ENCOUNTER — Other Ambulatory Visit: Payer: Self-pay | Admitting: Radiology

## 2014-03-07 ENCOUNTER — Encounter (HOSPITAL_COMMUNITY): Payer: Self-pay

## 2014-03-07 ENCOUNTER — Ambulatory Visit (HOSPITAL_COMMUNITY)
Admission: RE | Admit: 2014-03-07 | Discharge: 2014-03-07 | Disposition: A | Discharge status: Home / Self Care | Payer: PRIVATE HEALTH INSURANCE | Source: Ambulatory Visit | Attending: Internal Medicine | Admitting: Internal Medicine

## 2014-03-07 VITALS — BP 142/78 | HR 69 | Temp 97.7°F | Resp 20 | Wt 240.0 lb

## 2014-03-07 DIAGNOSIS — E785 Hyperlipidemia, unspecified: Secondary | ICD-10-CM | POA: Diagnosis not present

## 2014-03-07 DIAGNOSIS — F1010 Alcohol abuse, uncomplicated: Secondary | ICD-10-CM | POA: Insufficient documentation

## 2014-03-07 DIAGNOSIS — N319 Neuromuscular dysfunction of bladder, unspecified: Secondary | ICD-10-CM | POA: Insufficient documentation

## 2014-03-07 DIAGNOSIS — G8220 Paraplegia, unspecified: Secondary | ICD-10-CM | POA: Diagnosis not present

## 2014-03-07 DIAGNOSIS — B1920 Unspecified viral hepatitis C without hepatic coma: Secondary | ICD-10-CM | POA: Insufficient documentation

## 2014-03-07 DIAGNOSIS — R569 Unspecified convulsions: Secondary | ICD-10-CM | POA: Insufficient documentation

## 2014-03-07 DIAGNOSIS — G8929 Other chronic pain: Secondary | ICD-10-CM | POA: Diagnosis not present

## 2014-03-07 DIAGNOSIS — F1910 Other psychoactive substance abuse, uncomplicated: Secondary | ICD-10-CM | POA: Diagnosis not present

## 2014-03-07 DIAGNOSIS — F319 Bipolar disorder, unspecified: Secondary | ICD-10-CM | POA: Diagnosis not present

## 2014-03-07 DIAGNOSIS — I10 Essential (primary) hypertension: Secondary | ICD-10-CM | POA: Insufficient documentation

## 2014-03-07 DIAGNOSIS — I2699 Other pulmonary embolism without acute cor pulmonale: Secondary | ICD-10-CM | POA: Diagnosis present

## 2014-03-07 DIAGNOSIS — Z7901 Long term (current) use of anticoagulants: Secondary | ICD-10-CM | POA: Diagnosis not present

## 2014-03-07 DIAGNOSIS — I82409 Acute embolism and thrombosis of unspecified deep veins of unspecified lower extremity: Secondary | ICD-10-CM

## 2014-03-07 LAB — BASIC METABOLIC PANEL
ANION GAP: 13 (ref 5–15)
BUN: 12 mg/dL (ref 6–23)
CO2: 26 mEq/L (ref 19–32)
Calcium: 9.2 mg/dL (ref 8.4–10.5)
Chloride: 101 mEq/L (ref 96–112)
Creatinine, Ser: 0.7 mg/dL (ref 0.50–1.35)
GFR calc Af Amer: >90 mL/min (ref >90)
GFR calc non Af Amer: >90 mL/min (ref >90)
Glucose, Bld: 113 mg/dL — ABNORMAL HIGH (ref 70–99)
Potassium: 4.2 mEq/L (ref 3.7–5.3)
SODIUM: 140 meq/L (ref 137–147)

## 2014-03-07 LAB — CBC
HCT: 44.7 % (ref 39.0–52.0)
Hemoglobin: 14.8 g/dL (ref 13.0–17.0)
MCH: 29 pg (ref 26.0–34.0)
MCHC: 33.1 g/dL (ref 30.0–36.0)
MCV: 87.6 fL (ref 78.0–100.0)
PLATELETS: 276 10*3/uL (ref 150–400)
RBC: 5.1 MIL/uL (ref 4.22–5.81)
RDW: 14.8 % (ref 11.5–15.5)
WBC: 9 10*3/uL (ref 4.0–10.5)

## 2014-03-07 LAB — PROTIME-INR
INR: 2.59 — ABNORMAL HIGH (ref 0.00–1.49)
Prothrombin Time: 27.8 seconds — ABNORMAL HIGH (ref 11.6–15.2)

## 2014-03-07 MED ORDER — MIDAZOLAM HCL 2 MG/2ML IJ SOLN
INTRAMUSCULAR | Status: AC
  Filled 2014-03-07: qty 2

## 2014-03-07 MED ORDER — IOHEXOL 300 MG/ML  SOLN
80.0000 mL | Freq: Once | INTRAMUSCULAR | Status: AC | PRN
  Administered 2014-03-07: 1 mL via INTRAVENOUS

## 2014-03-07 MED ORDER — LIDOCAINE HCL (PF) 1 % IJ SOLN
INTRAMUSCULAR | Status: AC
  Filled 2014-03-07: qty 30

## 2014-03-07 MED ORDER — MIDAZOLAM HCL 2 MG/2ML IJ SOLN
INTRAMUSCULAR | Status: AC | PRN
  Administered 2014-03-07: 0.5 mg via INTRAVENOUS
  Administered 2014-03-07: 1 mg via INTRAVENOUS
  Administered 2014-03-07: 0.5 mg via INTRAVENOUS

## 2014-03-07 MED ORDER — SODIUM CHLORIDE 0.9 % IV SOLN: INTRAVENOUS | Status: DC

## 2014-03-07 MED ORDER — FENTANYL CITRATE 0.05 MG/ML IJ SOLN
INTRAMUSCULAR | Status: AC
  Filled 2014-03-07: qty 2

## 2014-03-07 MED ORDER — OXYCODONE-ACETAMINOPHEN 5-325 MG PO TABS
ORAL_TABLET | ORAL | Status: AC
  Filled 2014-03-07: qty 2

## 2014-03-07 MED ORDER — HYDROCODONE-ACETAMINOPHEN 5-325 MG PO TABS
1.0000 | ORAL_TABLET | ORAL | Status: DC | PRN
  Administered 2014-03-07: 1 via ORAL

## 2014-03-07 MED ORDER — HYDROCODONE-ACETAMINOPHEN 5-325 MG PO TABS
ORAL_TABLET | ORAL | Status: AC
  Filled 2014-03-07: qty 1

## 2014-03-07 NOTE — Sedation Documentation (Signed)
Patient denies pain and is resting comfortably. sleeping

## 2014-03-07 NOTE — Procedures (Signed)
IVC filter retrieval No comp 
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
## 2014-03-07 NOTE — Discharge Instructions (Signed)
Wound Care Wound care helps prevent pain and infection.  [place]   Only take medicine as told by your doctor.  Clean the wound daily with mild soap and water.  Change any bandages (dressings) as told by your doctor.  Put medicated cream and a bandage on the wound as told by your doctor.  Change the bandage if it gets wet, dirty, or starts to smell.  Take showers. Do not take baths, swim, or do anything that puts your wound under water.  Rest and raise (elevate) the wound until the pain and puffiness (swelling) are better.  Keep all doctor visits as told. GET HELP RIGHT AWAY IF:   Yellowish-white fluid (pus) comes from the wound.  Medicine does not lessen your pain.  There is a red streak going away from the wound.  You have a fever. MAKE SURE YOU:   Understand these instructions.  Will watch your condition.  Will get help right away if you are not doing well or get worse. Document Released: 04/22/2008 Document Revised: 10/06/2011 Document Reviewed: 11/17/2010 [facility] Patient Information 2015 [place], [place]. This information is not intended to replace advice given to you by your health care provider. Make sure you discuss any questions you have with your health care provider.

## 2014-03-07 NOTE — Sedation Documentation (Signed)
Pt has Fentanyl patch to RU chest d/t chronic L AKA phantom pain.  MD notified.

## 2014-03-17 ENCOUNTER — Encounter: Payer: Self-pay | Admitting: Internal Medicine

## 2014-03-30 ENCOUNTER — Other Ambulatory Visit: Payer: Self-pay | Admitting: *Deleted

## 2014-03-30 MED ORDER — CLONAZEPAM 1 MG PO TABS: ORAL_TABLET | ORAL | Status: DC

## 2014-03-30 MED ORDER — FENTANYL 75 MCG/HR TD PT72: 75.0000 ug | MEDICATED_PATCH | TRANSDERMAL | Status: DC

## 2014-03-30 NOTE — Telephone Encounter (Signed)
[facility] 

## 2014-04-13 ENCOUNTER — Other Ambulatory Visit: Payer: Self-pay | Admitting: Gastroenterology

## 2014-04-13 DIAGNOSIS — K219 Gastro-esophageal reflux disease without esophagitis: Secondary | ICD-10-CM

## 2014-04-25 ENCOUNTER — Non-Acute Institutional Stay (SKILLED_NURSING_FACILITY): Payer: PRIVATE HEALTH INSURANCE | Admitting: Internal Medicine

## 2014-04-25 DIAGNOSIS — Z7901 Long term (current) use of anticoagulants: Secondary | ICD-10-CM

## 2014-04-25 DIAGNOSIS — K219 Gastro-esophageal reflux disease without esophagitis: Secondary | ICD-10-CM

## 2014-04-25 DIAGNOSIS — R05 Cough: Secondary | ICD-10-CM

## 2014-04-25 DIAGNOSIS — R059 Cough, unspecified: Secondary | ICD-10-CM
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
## 2014-04-26 ENCOUNTER — Other Ambulatory Visit (HOSPITAL_COMMUNITY): Payer: Self-pay | Admitting: Gastroenterology

## 2014-04-26 ENCOUNTER — Other Ambulatory Visit: Payer: Self-pay | Admitting: Gastroenterology

## 2014-04-26 ENCOUNTER — Other Ambulatory Visit: Payer: Self-pay | Admitting: *Deleted

## 2014-04-26 ENCOUNTER — Ambulatory Visit
Admission: RE | Admit: 2014-04-26 | Discharge: 2014-04-26 | Disposition: A | Discharge status: Home / Self Care | Payer: PRIVATE HEALTH INSURANCE | Source: Ambulatory Visit | Attending: Gastroenterology | Admitting: Gastroenterology

## 2014-04-26 ENCOUNTER — Other Ambulatory Visit (HOSPITAL_COMMUNITY): Payer: Self-pay | Admitting: *Deleted

## 2014-04-26 DIAGNOSIS — K2100 Gastro-esophageal reflux disease with esophagitis, without bleeding: Secondary | ICD-10-CM

## 2014-04-26 DIAGNOSIS — K219 Gastro-esophageal reflux disease without esophagitis: Secondary | ICD-10-CM

## 2014-04-26 DIAGNOSIS — IMO0001 Reserved for inherently not codable concepts without codable children: Secondary | ICD-10-CM

## 2014-04-26 MED ORDER — FENTANYL 75 MCG/HR TD PT72: 75.0000 ug | MEDICATED_PATCH | TRANSDERMAL | Status: DC

## 2014-04-26 NOTE — Telephone Encounter (Signed)
[facility] 

## 2014-05-01 ENCOUNTER — Ambulatory Visit (HOSPITAL_COMMUNITY): Payer: PRIVATE HEALTH INSURANCE

## 2014-05-08 ENCOUNTER — Other Ambulatory Visit: Payer: Self-pay | Admitting: *Deleted

## 2014-05-08 MED ORDER — CLONAZEPAM 0.5 MG PO TABS: ORAL_TABLET | ORAL | Status: DC

## 2014-05-08 NOTE — Telephone Encounter (Signed)
[facility] 

## 2014-05-09 ENCOUNTER — Ambulatory Visit (HOSPITAL_COMMUNITY)
Admission: RE | Admit: 2014-05-09 | Discharge: 2014-05-09 | Disposition: A | Discharge status: Home / Self Care | Payer: PRIVATE HEALTH INSURANCE | Source: Ambulatory Visit | Attending: Gastroenterology | Admitting: Gastroenterology

## 2014-05-09 DIAGNOSIS — K2100 Gastro-esophageal reflux disease with esophagitis, without bleeding: Secondary | ICD-10-CM

## 2014-05-09 DIAGNOSIS — K449 Diaphragmatic hernia without obstruction or gangrene: Secondary | ICD-10-CM | POA: Diagnosis not present

## 2014-05-18 ENCOUNTER — Telehealth: Payer: Self-pay | Admitting: Internal Medicine

## 2014-05-18 NOTE — Telephone Encounter (Signed)
Samuel [name] and had to [facility] w/ co-worker

## 2014-05-18 NOTE — Telephone Encounter (Signed)
MW please advise if pt will need to have CT chest done.  I have looked back in the chart and looks like the last ct chest was done by Dr. Regenia Skeeter.  Please advise. thanks

## 2014-05-18 NOTE — Telephone Encounter (Signed)
Pulmonary f/u is prn - no need for CT at this point

## 2014-05-19 NOTE — Telephone Encounter (Signed)
Spoke with Cyndra Numbers is unable to speak at the time. Advised [facility] rec's per MW. Requests OV notes for their record of last OV. They did not have recorded that the patient was recently seen. Advised that the patient was seen 01/31/14. OV notes faxed to (F) [phone] ATTN: [facility] Nothing further needed.

## 2014-05-22 ENCOUNTER — Other Ambulatory Visit: Payer: Self-pay | Admitting: *Deleted

## 2014-05-22 MED ORDER — OXYCODONE HCL 15 MG PO TABS: ORAL_TABLET | ORAL | Status: DC

## 2014-05-22 NOTE — Telephone Encounter (Signed)
[facility] 

## 2014-05-29 ENCOUNTER — Other Ambulatory Visit: Payer: Self-pay | Admitting: *Deleted

## 2014-05-29 MED ORDER — FENTANYL 75 MCG/HR TD PT72: 75.0000 ug | MEDICATED_PATCH | TRANSDERMAL | Status: DC

## 2014-05-29 NOTE — Telephone Encounter (Signed)
[facility] 

## 2014-06-06 ENCOUNTER — Non-Acute Institutional Stay (SKILLED_NURSING_FACILITY): Payer: PRIVATE HEALTH INSURANCE | Admitting: Internal Medicine

## 2014-06-06 DIAGNOSIS — I2782 Chronic pulmonary embolism: Secondary | ICD-10-CM

## 2014-06-06 DIAGNOSIS — K219 Gastro-esophageal reflux disease without esophagitis: Secondary | ICD-10-CM
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
## 2014-06-16 ENCOUNTER — Other Ambulatory Visit: Payer: Self-pay | Admitting: Internal Medicine

## 2014-06-26 ENCOUNTER — Other Ambulatory Visit: Payer: Self-pay | Admitting: *Deleted

## 2014-06-26 MED ORDER — PREGABALIN 75 MG PO CAPS: ORAL_CAPSULE | ORAL | Status: DC

## 2014-06-26 NOTE — Telephone Encounter (Signed)
[facility] 

## 2014-06-27 ENCOUNTER — Other Ambulatory Visit: Payer: Self-pay | Admitting: Internal Medicine

## 2014-06-27 DIAGNOSIS — R9389 Abnormal findings on diagnostic imaging of other specified body structures: Secondary | ICD-10-CM

## 2014-06-27 DIAGNOSIS — J189 Pneumonia, unspecified organism: Secondary | ICD-10-CM

## 2014-06-27 HISTORY — DX: Pneumonia, unspecified organism: J18.9

## 2014-06-28 ENCOUNTER — Emergency Department (HOSPITAL_COMMUNITY): Payer: PRIVATE HEALTH INSURANCE

## 2014-06-28 ENCOUNTER — Inpatient Hospital Stay (HOSPITAL_COMMUNITY)
Admission: EM | Admit: 2014-06-28 | Discharge: 2014-07-03 | DRG: 871 | Disposition: A | Discharge status: Skilled Nursing Facility | Payer: PRIVATE HEALTH INSURANCE | Attending: Internal Medicine | Admitting: Internal Medicine

## 2014-06-28 ENCOUNTER — Encounter (HOSPITAL_COMMUNITY): Payer: Self-pay | Admitting: Emergency Medicine

## 2014-06-28 DIAGNOSIS — I10 Essential (primary) hypertension: Secondary | ICD-10-CM

## 2014-06-28 DIAGNOSIS — R0600 Dyspnea, unspecified: Secondary | ICD-10-CM | POA: Insufficient documentation

## 2014-06-28 DIAGNOSIS — F319 Bipolar disorder, unspecified: Secondary | ICD-10-CM | POA: Diagnosis present

## 2014-06-28 DIAGNOSIS — R0602 Shortness of breath: Secondary | ICD-10-CM | POA: Diagnosis not present

## 2014-06-28 DIAGNOSIS — Z7952 Long term (current) use of systemic steroids: Secondary | ICD-10-CM

## 2014-06-28 DIAGNOSIS — Z6841 Body Mass Index (BMI) 40.0 and over, adult: Secondary | ICD-10-CM

## 2014-06-28 DIAGNOSIS — R569 Unspecified convulsions: Secondary | ICD-10-CM | POA: Diagnosis present

## 2014-06-28 DIAGNOSIS — K219 Gastro-esophageal reflux disease without esophagitis: Secondary | ICD-10-CM | POA: Diagnosis present

## 2014-06-28 DIAGNOSIS — Y95 Nosocomial condition: Secondary | ICD-10-CM | POA: Diagnosis present

## 2014-06-28 DIAGNOSIS — G8220 Paraplegia, unspecified: Secondary | ICD-10-CM | POA: Diagnosis present

## 2014-06-28 DIAGNOSIS — Z87891 Personal history of nicotine dependence: Secondary | ICD-10-CM

## 2014-06-28 DIAGNOSIS — J189 Pneumonia, unspecified organism: Secondary | ICD-10-CM

## 2014-06-28 DIAGNOSIS — G8929 Other chronic pain: Secondary | ICD-10-CM

## 2014-06-28 DIAGNOSIS — N319 Neuromuscular dysfunction of bladder, unspecified: Secondary | ICD-10-CM | POA: Diagnosis present

## 2014-06-28 DIAGNOSIS — Z89612 Acquired absence of left leg above knee: Secondary | ICD-10-CM

## 2014-06-28 DIAGNOSIS — Z7901 Long term (current) use of anticoagulants: Secondary | ICD-10-CM

## 2014-06-28 DIAGNOSIS — A419 Sepsis, unspecified organism: Principal | ICD-10-CM

## 2014-06-28 DIAGNOSIS — Z933 Colostomy status: Secondary | ICD-10-CM

## 2014-06-28 DIAGNOSIS — E538 Deficiency of other specified B group vitamins: Secondary | ICD-10-CM | POA: Diagnosis present

## 2014-06-28 DIAGNOSIS — E669 Obesity, unspecified: Secondary | ICD-10-CM | POA: Diagnosis present

## 2014-06-28 DIAGNOSIS — Z7401 Bed confinement status: Secondary | ICD-10-CM

## 2014-06-28 DIAGNOSIS — E785 Hyperlipidemia, unspecified: Secondary | ICD-10-CM | POA: Diagnosis present

## 2014-06-28 DIAGNOSIS — R400 Somnolence: Secondary | ICD-10-CM | POA: Clinically undetermined

## 2014-06-28 HISTORY — DX: Pneumonia, unspecified organism: J18.9

## 2014-06-28 LAB — URINE MICROSCOPIC-ADD ON

## 2014-06-28 LAB — URINALYSIS, ROUTINE W REFLEX MICROSCOPIC
Bilirubin Urine: NEGATIVE
Glucose, UA: NEGATIVE mg/dL
Ketones, ur: NEGATIVE mg/dL
NITRITE: NEGATIVE
PH: 6.5 (ref 5.0–8.0)
Protein, ur: NEGATIVE mg/dL
SPECIFIC GRAVITY, URINE: 1.009 (ref 1.005–1.030)
UROBILINOGEN UA: 0.2 mg/dL (ref 0.0–1.0)

## 2014-06-28 LAB — CBC WITH DIFFERENTIAL/PLATELET
Basophils Absolute: 0 10*3/uL (ref 0.0–0.1)
Basophils Relative: 0 % (ref 0–1)
Eosinophils Absolute: 0.3 10*3/uL (ref 0.0–0.7)
Eosinophils Relative: 2 % (ref 0–5)
HEMATOCRIT: 46 % (ref 39.0–52.0)
Hemoglobin: 15.1 g/dL (ref 13.0–17.0)
LYMPHS PCT: 6 % — AB (ref 12–46)
Lymphs Abs: 1 10*3/uL (ref 0.7–4.0)
MCH: 28.4 pg (ref 26.0–34.0)
MCHC: 32.8 g/dL (ref 30.0–36.0)
MCV: 86.5 fL (ref 78.0–100.0)
MONO ABS: 0.6 10*3/uL (ref 0.1–1.0)
Monocytes Relative: 4 % (ref 3–12)
NEUTROS ABS: 14.6 10*3/uL — AB (ref 1.7–7.7)
Neutrophils Relative %: 88 % — ABNORMAL HIGH (ref 43–77)
Platelets: 250 10*3/uL (ref 150–400)
RBC: 5.32 MIL/uL (ref 4.22–5.81)
RDW: 14.1 % (ref 11.5–15.5)
WBC: 16.6 10*3/uL — AB (ref 4.0–10.5)

## 2014-06-28 LAB — PROTIME-INR
INR: 1.7 — AB (ref 0.00–1.49)
PROTHROMBIN TIME: 20.2 s — AB (ref 11.6–15.2)

## 2014-06-28 LAB — BASIC METABOLIC PANEL
Anion gap: 17 — ABNORMAL HIGH (ref 5–15)
BUN: 11 mg/dL (ref 6–23)
CHLORIDE: 101 meq/L (ref 96–112)
CO2: 22 meq/L (ref 19–32)
Calcium: 9.2 mg/dL (ref 8.4–10.5)
Creatinine, Ser: 0.85 mg/dL (ref 0.50–1.35)
GFR calc Af Amer: >90 mL/min (ref >90)
GFR calc non Af Amer: >90 mL/min (ref >90)
GLUCOSE: 130 mg/dL — AB (ref 70–99)
POTASSIUM: 4.3 meq/L (ref 3.7–5.3)
Sodium: 140 mEq/L (ref 137–147)

## 2014-06-28 LAB — I-STAT CG4 LACTIC ACID, ED: LACTIC ACID, VENOUS: 3.99 mmol/L — AB (ref 0.5–2.2)

## 2014-06-28 MED ORDER — VANCOMYCIN HCL 10 G IV SOLR
2000.0000 mg | Freq: Once | INTRAVENOUS | Status: AC
  Administered 2014-06-29: 2000 mg via INTRAVENOUS
  Filled 2014-06-28: qty 2000

## 2014-06-28 MED ORDER — ONDANSETRON HCL 4 MG/2ML IJ SOLN
4.0000 mg | Freq: Once | INTRAMUSCULAR | Status: AC
Start: 2014-06-28 — End: 2014-06-28
  Administered 2014-06-28: 4 mg via INTRAVENOUS
  Filled 2014-06-28: qty 2

## 2014-06-28 MED ORDER — SODIUM CHLORIDE 0.9 % IV SOLN
Freq: Once | INTRAVENOUS | Status: AC
  Administered 2014-06-28: 22:00:00 via INTRAVENOUS

## 2014-06-28 MED ORDER — CEFEPIME HCL 2 G IJ SOLR
2.0000 g | Freq: Once | INTRAMUSCULAR | Status: AC
  Administered 2014-06-28: 2 g via INTRAVENOUS
  Filled 2014-06-28: qty 2

## 2014-06-28 MED ORDER — SODIUM CHLORIDE 0.9 % IV BOLUS (SEPSIS)
1000.0000 mL | Freq: Once | INTRAVENOUS | Status: AC
  Administered 2014-06-28: 1000 mL via INTRAVENOUS

## 2014-06-28 MED ORDER — VANCOMYCIN HCL IN DEXTROSE 1-5 GM/200ML-% IV SOLN
1000.0000 mg | Freq: Two times a day (BID) | INTRAVENOUS | Status: DC
  Administered 2014-06-29 – 2014-07-01 (×5): 1000 mg via INTRAVENOUS
  Filled 2014-06-28 (×8): qty 200

## 2014-06-28 MED ORDER — HYDROMORPHONE HCL 1 MG/ML IJ SOLN
1.0000 mg | Freq: Once | INTRAMUSCULAR | Status: AC
  Administered 2014-06-28: 1 mg via INTRAVENOUS
  Filled 2014-06-28: qty 1

## 2014-06-28 MED ORDER — ACETAMINOPHEN 500 MG PO TABS
1000.0000 mg | ORAL_TABLET | Freq: Once | ORAL | Status: AC
  Administered 2014-06-28: 1000 mg via ORAL
  Filled 2014-06-28: qty 2

## 2014-06-28 MED ORDER — SODIUM CHLORIDE 0.9 % IV BOLUS (SEPSIS)
1000.0000 mL | Freq: Once | INTRAVENOUS | Status: AC
Start: 2014-06-28 — End: 2014-06-29
  Administered 2014-06-29: 1000 mL via INTRAVENOUS

## 2014-06-28 MED ORDER — MORPHINE SULFATE 4 MG/ML IJ SOLN
4.0000 mg | INTRAMUSCULAR | Status: AC | PRN
  Administered 2014-06-28 – 2014-06-29 (×3): 4 mg via INTRAVENOUS
  Filled 2014-06-28 (×3): qty 1

## 2014-06-28 NOTE — ED Notes (Signed)
C/o sob with fever recently diagnosed with pneumonia pt has suprapubic cath colostomy and left aka

## 2014-06-28 NOTE — ED Notes (Signed)
Oxygen to nasal cannula at 2 liters
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
## 2014-06-28 NOTE — ED Notes (Signed)
Pt monitored by pulse ox, bp cuff, and 5-lead. 

## 2014-06-28 NOTE — ED Notes (Signed)
I stat lactic acid results given to Dr. Jeneen Rinks

## 2014-06-28 NOTE — ED Notes (Signed)
MD at bedside. 
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
## 2014-06-28 NOTE — ED Notes (Signed)
Oxygen sat 87% on 6liters  Nasal cannula pt placed on nrb at 10 liters sat 95%

## 2014-06-29 ENCOUNTER — Encounter (HOSPITAL_COMMUNITY): Payer: Self-pay | Admitting: General Practice

## 2014-06-29 DIAGNOSIS — Z7401 Bed confinement status: Secondary | ICD-10-CM | POA: Diagnosis not present

## 2014-06-29 DIAGNOSIS — Z933 Colostomy status: Secondary | ICD-10-CM | POA: Diagnosis not present

## 2014-06-29 DIAGNOSIS — Z7952 Long term (current) use of systemic steroids: Secondary | ICD-10-CM | POA: Diagnosis not present

## 2014-06-29 DIAGNOSIS — G8220 Paraplegia, unspecified: Secondary | ICD-10-CM | POA: Diagnosis present

## 2014-06-29 DIAGNOSIS — R0602 Shortness of breath: Secondary | ICD-10-CM | POA: Diagnosis present

## 2014-06-29 DIAGNOSIS — I10 Essential (primary) hypertension: Secondary | ICD-10-CM | POA: Diagnosis present

## 2014-06-29 DIAGNOSIS — N319 Neuromuscular dysfunction of bladder, unspecified: Secondary | ICD-10-CM | POA: Diagnosis present

## 2014-06-29 DIAGNOSIS — Z7901 Long term (current) use of anticoagulants: Secondary | ICD-10-CM | POA: Diagnosis not present

## 2014-06-29 DIAGNOSIS — G8929 Other chronic pain: Secondary | ICD-10-CM | POA: Diagnosis present

## 2014-06-29 DIAGNOSIS — J189 Pneumonia, unspecified organism: Secondary | ICD-10-CM | POA: Diagnosis present

## 2014-06-29 DIAGNOSIS — Y95 Nosocomial condition: Secondary | ICD-10-CM | POA: Diagnosis present

## 2014-06-29 DIAGNOSIS — R569 Unspecified convulsions: Secondary | ICD-10-CM | POA: Diagnosis present

## 2014-06-29 DIAGNOSIS — Z89612 Acquired absence of left leg above knee: Secondary | ICD-10-CM | POA: Diagnosis not present

## 2014-06-29 DIAGNOSIS — F319 Bipolar disorder, unspecified: Secondary | ICD-10-CM | POA: Diagnosis present

## 2014-06-29 DIAGNOSIS — Z87891 Personal history of nicotine dependence: Secondary | ICD-10-CM | POA: Diagnosis not present

## 2014-06-29 DIAGNOSIS — A419 Sepsis, unspecified organism: Secondary | ICD-10-CM | POA: Diagnosis present

## 2014-06-29 DIAGNOSIS — Z6841 Body Mass Index (BMI) 40.0 and over, adult: Secondary | ICD-10-CM | POA: Diagnosis not present

## 2014-06-29 DIAGNOSIS — E669 Obesity, unspecified: Secondary | ICD-10-CM | POA: Diagnosis present

## 2014-06-29 DIAGNOSIS — K219 Gastro-esophageal reflux disease without esophagitis: Secondary | ICD-10-CM | POA: Diagnosis present

## 2014-06-29 DIAGNOSIS — E538 Deficiency of other specified B group vitamins: Secondary | ICD-10-CM | POA: Diagnosis present

## 2014-06-29 DIAGNOSIS — E785 Hyperlipidemia, unspecified: Secondary | ICD-10-CM | POA: Diagnosis present

## 2014-06-29 LAB — STREP PNEUMONIAE URINARY ANTIGEN: Strep Pneumo Urinary Antigen: NEGATIVE

## 2014-06-29 LAB — LACTIC ACID, PLASMA: Lactic Acid, Venous: 3.1 mmol/L — ABNORMAL HIGH (ref 0.5–2.2)

## 2014-06-29 LAB — GLUCOSE, CAPILLARY
GLUCOSE-CAPILLARY: 106 mg/dL — AB (ref 70–99)
GLUCOSE-CAPILLARY: 136 mg/dL — AB (ref 70–99)
GLUCOSE-CAPILLARY: 149 mg/dL — AB (ref 70–99)

## 2014-06-29 LAB — COMPREHENSIVE METABOLIC PANEL
ALBUMIN: 2.9 g/dL — AB (ref 3.5–5.2)
ALT: 11 U/L (ref 0–53)
AST: 15 U/L (ref 0–37)
Alkaline Phosphatase: 65 U/L (ref 39–117)
Anion gap: 12 (ref 5–15)
BUN: 11 mg/dL (ref 6–23)
CALCIUM: 8.2 mg/dL — AB (ref 8.4–10.5)
CO2: 23 mEq/L (ref 19–32)
Chloride: 108 mEq/L (ref 96–112)
Creatinine, Ser: 0.8 mg/dL (ref 0.50–1.35)
GFR calc Af Amer: >90 mL/min (ref >90)
GFR calc non Af Amer: >90 mL/min (ref >90)
Glucose, Bld: 121 mg/dL — ABNORMAL HIGH (ref 70–99)
Potassium: 4.4 mEq/L (ref 3.7–5.3)
Sodium: 143 mEq/L (ref 137–147)
TOTAL PROTEIN: 6.3 g/dL (ref 6.0–8.3)
Total Bilirubin: 0.4 mg/dL (ref 0.3–1.2)

## 2014-06-29 LAB — PROCALCITONIN: Procalcitonin: 6.84 ng/mL

## 2014-06-29 LAB — MRSA PCR SCREENING: MRSA BY PCR: POSITIVE — AB

## 2014-06-29 MED ORDER — QUETIAPINE FUMARATE 50 MG PO TABS
200.0000 mg | ORAL_TABLET | Freq: Every day | ORAL | Status: DC
  Administered 2014-06-29 – 2014-07-02 (×4): 200 mg via ORAL
  Filled 2014-06-29 (×3): qty 4
  Filled 2014-06-29: qty 8
  Filled 2014-06-29 (×2): qty 4

## 2014-06-29 MED ORDER — BACLOFEN 20 MG PO TABS
20.0000 mg | ORAL_TABLET | Freq: Three times a day (TID) | ORAL | Status: DC
  Administered 2014-06-29 – 2014-07-02 (×10): 20 mg via ORAL
  Filled 2014-06-29 (×11): qty 1

## 2014-06-29 MED ORDER — POLYVINYL ALCOHOL 1.4 % OP SOLN
2.0000 [drp] | Freq: Three times a day (TID) | OPHTHALMIC | Status: DC
  Administered 2014-06-29 – 2014-07-03 (×14): 2 [drp] via OPHTHALMIC
  Filled 2014-06-29: qty 15

## 2014-06-29 MED ORDER — ALUM & MAG HYDROXIDE-SIMETH 200-200-20 MG/5ML PO SUSP
15.0000 mL | Freq: Four times a day (QID) | ORAL | Status: DC | PRN
  Administered 2014-06-29 – 2014-06-30 (×3): 15 mL via ORAL
  Filled 2014-06-29 (×3): qty 30

## 2014-06-29 MED ORDER — ACETAMINOPHEN 325 MG PO TABS
650.0000 mg | ORAL_TABLET | Freq: Once | ORAL | Status: AC
  Administered 2014-06-29: 650 mg via ORAL
  Filled 2014-06-29: qty 2

## 2014-06-29 MED ORDER — WARFARIN SODIUM 5 MG PO TABS
5.0000 mg | ORAL_TABLET | Freq: Once | ORAL | Status: AC
  Administered 2014-06-29: 5 mg via ORAL
  Filled 2014-06-29 (×2): qty 1

## 2014-06-29 MED ORDER — OXYCODONE HCL 5 MG PO TABS
15.0000 mg | ORAL_TABLET | Freq: Four times a day (QID) | ORAL | Status: DC | PRN
  Administered 2014-06-29 – 2014-07-02 (×9): 15 mg via ORAL
  Filled 2014-06-29 (×9): qty 3

## 2014-06-29 MED ORDER — IPRATROPIUM-ALBUTEROL 0.5-2.5 (3) MG/3ML IN SOLN
3.0000 mL | Freq: Four times a day (QID) | RESPIRATORY_TRACT | Status: DC
  Filled 2014-06-29 (×2): qty 3

## 2014-06-29 MED ORDER — PREGABALIN 50 MG PO CAPS
75.0000 mg | ORAL_CAPSULE | Freq: Two times a day (BID) | ORAL | Status: DC
  Administered 2014-06-29 – 2014-07-03 (×9): 75 mg via ORAL
  Filled 2014-06-29 (×18): qty 1

## 2014-06-29 MED ORDER — PREDNISONE 10 MG PO TABS
10.0000 mg | ORAL_TABLET | Freq: Every day | ORAL | Status: DC
  Administered 2014-06-29 – 2014-07-03 (×5): 10 mg via ORAL
  Filled 2014-06-29 (×5): qty 1

## 2014-06-29 MED ORDER — OXYBUTYNIN CHLORIDE ER 5 MG PO TB24
5.0000 mg | ORAL_TABLET | Freq: Every day | ORAL | Status: DC
  Administered 2014-06-29 – 2014-07-03 (×5): 5 mg via ORAL
  Filled 2014-06-29 (×7): qty 1

## 2014-06-29 MED ORDER — SODIUM CHLORIDE 0.9 % IV SOLN
INTRAVENOUS | Status: DC
Start: 2014-06-29 — End: 2014-07-03
  Administered 2014-06-30: 75 mL via INTRAVENOUS
  Administered 2014-06-30: 03:00:00 via INTRAVENOUS

## 2014-06-29 MED ORDER — CLONAZEPAM 1 MG PO TABS
1.0000 mg | ORAL_TABLET | Freq: Two times a day (BID) | ORAL | Status: DC | PRN
  Administered 2014-06-29: 1 mg via ORAL
  Filled 2014-06-29: qty 2

## 2014-06-29 MED ORDER — CHLORHEXIDINE GLUCONATE CLOTH 2 % EX PADS
6.0000 | MEDICATED_PAD | Freq: Every day | CUTANEOUS | Status: AC
  Administered 2014-06-29 – 2014-07-03 (×5): 6 via TOPICAL

## 2014-06-29 MED ORDER — ACETAMINOPHEN 325 MG PO TABS
650.0000 mg | ORAL_TABLET | Freq: Four times a day (QID) | ORAL | Status: DC | PRN
  Administered 2014-06-29 – 2014-07-03 (×4): 650 mg via ORAL
  Filled 2014-06-29 (×4): qty 2

## 2014-06-29 MED ORDER — CLOMIPHENE CITRATE 50 MG PO TABS
100.0000 mg | ORAL_TABLET | Freq: Every day | ORAL | Status: DC
  Filled 2014-06-29: qty 2

## 2014-06-29 MED ORDER — QUETIAPINE FUMARATE 25 MG PO TABS: 100.0000 mg | ORAL_TABLET | Freq: Every morning | ORAL | Status: DC

## 2014-06-29 MED ORDER — DEXTROSE 5 % IV SOLN
1.0000 g | Freq: Three times a day (TID) | INTRAVENOUS | Status: DC
  Administered 2014-06-29 – 2014-06-30 (×4): 1 g via INTRAVENOUS
  Filled 2014-06-29 (×6): qty 1

## 2014-06-29 MED ORDER — MUPIROCIN 2 % EX OINT
1.0000 "application " | TOPICAL_OINTMENT | Freq: Two times a day (BID) | CUTANEOUS | Status: DC
  Administered 2014-06-29 – 2014-07-03 (×7): 1 via NASAL
  Filled 2014-06-29 (×2): qty 22

## 2014-06-29 MED ORDER — PANTOPRAZOLE SODIUM 40 MG PO TBEC
40.0000 mg | DELAYED_RELEASE_TABLET | Freq: Every day | ORAL | Status: DC
Start: 2014-06-29 — End: 2014-07-03
  Administered 2014-06-29 – 2014-07-03 (×5): 40 mg via ORAL
  Filled 2014-06-29 (×5): qty 1

## 2014-06-29 MED ORDER — LEVOFLOXACIN IN D5W 750 MG/150ML IV SOLN
750.0000 mg | INTRAVENOUS | Status: DC
  Administered 2014-06-29: 750 mg via INTRAVENOUS
  Filled 2014-06-29 (×2): qty 150

## 2014-06-29 MED ORDER — DULOXETINE HCL 60 MG PO CPEP
60.0000 mg | ORAL_CAPSULE | Freq: Every day | ORAL | Status: DC
  Administered 2014-06-29 – 2014-07-02 (×4): 60 mg via ORAL
  Filled 2014-06-29 (×6): qty 1

## 2014-06-29 MED ORDER — IPRATROPIUM-ALBUTEROL 0.5-2.5 (3) MG/3ML IN SOLN
3.0000 mL | RESPIRATORY_TRACT | Status: DC | PRN
  Administered 2014-06-29 (×2): 3 mL via RESPIRATORY_TRACT
  Filled 2014-06-29 (×8): qty 3

## 2014-06-29 MED ORDER — WARFARIN - PHARMACIST DOSING INPATIENT
Freq: Every day | Status: DC
Start: 2014-06-29 — End: 2014-07-03

## 2014-06-29 MED ORDER — POLYETHYL GLYCOL-PROPYL GLYCOL 0.4-0.3 % OP SOLN: 2.0000 [drp] | Freq: Three times a day (TID) | OPHTHALMIC | Status: DC

## 2014-06-29 MED ORDER — CLOMIPHENE CITRATE 50 MG PO TABS
200.0000 mg | ORAL_TABLET | Freq: Every day | ORAL | Status: DC
  Filled 2014-06-29: qty 4

## 2014-06-29 MED ORDER — QUETIAPINE FUMARATE 50 MG PO TABS
100.0000 mg | ORAL_TABLET | Freq: Every day | ORAL | Status: DC
  Administered 2014-06-29 – 2014-07-02 (×4): 100 mg via ORAL
  Filled 2014-06-29 (×4): qty 2

## 2014-06-29 MED ORDER — LAMOTRIGINE 100 MG PO TABS
200.0000 mg | ORAL_TABLET | Freq: Two times a day (BID) | ORAL | Status: DC
  Administered 2014-06-29 – 2014-07-03 (×9): 200 mg via ORAL
  Filled 2014-06-29 (×9): qty 2

## 2014-06-29 MED ORDER — FENTANYL 25 MCG/HR TD PT72
75.0000 ug | MEDICATED_PATCH | TRANSDERMAL | Status: DC
  Administered 2014-06-29 – 2014-07-02 (×2): 75 ug via TRANSDERMAL
  Filled 2014-06-29 (×2): qty 3

## 2014-06-29 MED ORDER — CLONAZEPAM 0.5 MG PO TABS
0.5000 mg | ORAL_TABLET | Freq: Three times a day (TID) | ORAL | Status: DC
  Administered 2014-06-29 – 2014-07-02 (×9): 0.5 mg via ORAL
  Filled 2014-06-29 (×10): qty 1
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
## 2014-06-29 NOTE — Care Management Note (Addendum)
    Page 1 of 1   07/03/2014     2:21:10 PM CARE MANAGEMENT NOTE 07/03/2014  Patient:  GERY, SABEDRA   Account Number:  [id number]  Date Initiated:  06/29/2014  Documentation initiated by:  GRAVES-BIGELOW,Orlando Devereux  Subjective/Objective Assessment:   Pt from [facility] admitted for fever and cough. Paraplegia following spinal cord injury CXR showed Patchy infiltrate left base. Initiated on IV Abx therapy.     Action/Plan:   Plan is to return to [facility] once pt is stable for d/c. CSW to assist with disposition needs.   Anticipated DC Date:  07/02/2014   Anticipated DC Plan:  SKILLED NURSING FACILITY  In-house referral  Clinical Social Worker      DC Planning Services  CM consult      Choice offered to / List presented to:             Status of service:  Completed, signed off Medicare Important Message given?  YES (If response is "NO", the following Medicare IM given date fields will be blank) Date Medicare IM given:  07/03/2014 Medicare IM given by:  GRAVES-BIGELOW,Yeshua Stryker Date Additional Medicare IM given:   Additional Medicare IM given by:    Discharge Disposition:  [place]  Per UR Regulation:  Reviewed for med. necessity/level of care/duration of stay  If discussed at [place] of Stay Meetings, dates discussed:   07/04/2014    Comments:

## 2014-06-29 NOTE — ED Notes (Signed)
Spoke with MD Duotova regarding patient's pain control. MD Duotova states that patient may have tylenol to help with pain.

## 2014-06-29 NOTE — Clinical Social Work Psychosocial (Signed)
     Clinical Social Work Department BRIEF PSYCHOSOCIAL ASSESSMENT 06/29/2014  Patient:  Samuel [name], Samuel [name]     Account Number:  [id number]     Admit date:  06/28/2014  Clinical Social Worker:  Adair Laundry  Date/Time:  06/29/2014 12:48 PM  Referred by:  Physician  Date Referred:  06/29/2014 Referred for  SNF Placement  SNF Placement   Other Referral:   Interview type:  Patient Other interview type:    PSYCHOSOCIAL DATA Living Status:  FACILITY Admitted from facility:  [facility] Level of care:   Primary support name:  [name] Primary support relationship to patient:  CHILD, ADULT Degree of support available:   Pt has limited support    CURRENT CONCERNS Current Concerns  Post-Acute Placement   Other Concerns:    SOCIAL WORK ASSESSMENT / PLAN CSW notified pt was admitted from facility. CSW visited pt room and confirmed with pt he was admitted from [place]. Pt reported he has been at facility for about one year. Pt informed CSW he has no complaints with facility and will be happy to return at discharge. Pt informed CSW that per MD he will be here for a little while but he will need CSW to assist with transportation. CSW informed pt this would not be an issue. Pt has no further questions or concerns.   Assessment/plan status:  Psychosocial Support/Ongoing Assessment of Needs Other assessment/ plan:   Information/referral to community resources:   None needed    PATIENTS/FAMILYS RESPONSE TO PLAN OF CARE: Pt laying in bed and expressed he was tired when CSW entered room. Pt agreeable to returning to SNF at dc.    [place], [place]

## 2014-06-29 NOTE — Progress Notes (Signed)
PROGRESS NOTE  Samuel [name] NTZ:[id number] DOB: [birth year] DOA: 06/28/2014 PCP: Samuel Brightly, MD  Assessment/Plan: HCAP (healthcare-associated pneumonia) -from SNF so broad spectrum antibiotics -sputum culture -SLP eval  HTN (hypertension) - hold home meds  Bipolar disorder - continue  home meds   Sepsis -  - blood cultures, pro-calcitonin elevated,  lactic acid elevated but improved  Chronic pain - now continue fentanyl patch to avoid acute withdrawal.  Leukocytosis -monitor  Code Status: full Family Communication:  Disposition Plan: from ALF   Consultants:    Procedures:      HPI/Subjective: Breathing improved Asking to eat  Objective: Filed Vitals:   06/29/14 0815  BP: 130/73  Pulse: 96  Temp:   Resp: 23    Intake/Output Summary (Last 24 hours) at 06/29/14 0851 Last data filed at 06/29/14 0452  Gross per 24 hour  Intake      0 ml  Output   2100 ml  Net  -2100 ml   Filed Weights   06/28/14 2233 06/29/14 0545  Weight: 108.863 kg (240 lb) 115.667 kg (255 lb)    Exam:   General:  A+Ox3, NAD  Cardiovascular: rrr  Respiratory: no wheezing  Abdomen: +BS, ostomy   Data Reviewed: Basic Metabolic Panel:  Recent Labs Lab 06/28/14 2051 06/29/14 0703  NA 140 143  K 4.3 4.4  CL 101 108  CO2 22 23  GLUCOSE 130* 121*  BUN 11 11  CREATININE 0.85 0.80  CALCIUM 9.2 8.2*   Liver Function Tests:  Recent Labs Lab 06/29/14 0703  AST 15  ALT 11  ALKPHOS 65  BILITOT 0.4  PROT 6.3  ALBUMIN 2.9*   No results for input(s): LIPASE, AMYLASE in the last 168 hours. No results for input(s): AMMONIA in the last 168 hours. CBC:  Recent Labs Lab 06/28/14 2051  WBC 16.6*  NEUTROABS 14.6*  HGB 15.1  HCT 46.0  MCV 86.5  PLT 250   Cardiac Enzymes: No results for input(s): CKTOTAL, CKMB, CKMBINDEX, TROPONINI in the last 168 hours. BNP (last 3 results)  Recent Labs  09/19/13 1415 12/07/13 2221  PROBNP 123.8 56.9    CBG:  Recent Labs Lab 06/29/14 0728  GLUCAP 106*    Recent Results (from the past 240 hour(s))  MRSA PCR Screening     Status: Abnormal   Collection Time: 06/29/14  6:19 AM  Result Value Ref Range Status   MRSA by PCR POSITIVE (A) NEGATIVE Final    Comment:        The GeneXpert MRSA Assay (FDA approved for NASAL specimens only), is one component of a comprehensive MRSA colonization surveillance program. It is not intended to diagnose MRSA infection nor to guide or monitor treatment for MRSA infections. CRITICAL RESULT CALLED TO, READ BACK BY AND VERIFIED WITH: Samuel C RN 06/29/14 0840 [name] B      Studies: Dg Chest Port 1 View  06/28/2014   CLINICAL DATA:  Dyspnea and fever  EXAM: PORTABLE CHEST - 1 VIEW  COMPARISON:  Dec 07, 2013  FINDINGS: There is underlying emphysematous change. There is patchy consolidation in the left base. Lungs elsewhere clear. Heart is upper normal in size with pulmonary vascularity within normal limits. There is degenerative change in the thoracic spine.  IMPRESSION: Patchy infiltrate left base. Underlying emphysematous change. Heart upper normal in size.   Electronically Signed   By: Lowella Grip M.D.   On: 06/28/2014 22:02    Scheduled Meds: . baclofen  20 mg Oral TID  .  ceFEPime (MAXIPIME) IV  1 g Intravenous 3 times per day  . clonazePAM  0.5 mg Oral TID  . DULoxetine  60 mg Oral QHS  . fentaNYL  75 mcg Transdermal Q72H  . ipratropium-albuterol  3 mL Nebulization QID  . lamoTRIgine  200 mg Oral BID  . levofloxacin (LEVAQUIN) IV  750 mg Intravenous Q24H  . oxybutynin  5 mg Oral Daily  . pantoprazole  40 mg Oral Daily  . predniSONE  10 mg Oral Q breakfast  . pregabalin  75 mg Oral BID  . QUEtiapine  200 mg Oral Daily   And  . QUEtiapine  100 mg Oral QHS  . vancomycin  1,000 mg Intravenous Q12H  . warfarin  5 mg Oral ONCE-1800  . Warfarin - Pharmacist Dosing Inpatient   Does not apply q1800   Continuous Infusions: . sodium  chloride     Antibiotics Given (last 72 hours)    Date/Time Action Medication Dose Rate   06/29/14 0812 Given   ceFEPIme (MAXIPIME) 1 g in dextrose 5 % 50 mL IVPB 1 g 100 mL/hr      Active Problems:   Bipolar disorder   Chronic pain   Sepsis   HTN (hypertension)   HCAP (healthcare-associated pneumonia)   Pneumonia    Time spent: 28 min    Samuel [name]  [facility] Pager [phone]. If 7PM-7AM, please contact night-coverage at [email or web address], password Samuel [name] 06/29/2014, 8:51 AM  LOS: 1 day

## 2014-06-29 NOTE — ED Notes (Signed)
Attempted report 

## 2014-06-29 NOTE — Progress Notes (Signed)
Pt MRSA swab came back positive. Pt will continue to be on orange contact precautions. Will continue to monitor.

## 2014-06-29 NOTE — Progress Notes (Signed)
Notified by RN regarding pt's LOC. Pt admitted to SDU LOC, however RN reports pt's fever has normalized (98.3 oral) and tachycardia has greatly improved. HR now in low 100's. BP has normalized (114/71) and pt is resting quietly in NAD. Requesting consideration that pt's LOC be changed to telemetry. At bedside pt noted resting in NAD w/ eyes closed. He awakens easily and is oriented x 3. Current sats 95-99% on 4L [place]. Fever, hypotension, tacycardia and 02 demand requirements have all greatly improved over the last 3+ hours. Will change pt's LOC to telemetry and continue to monitor closely.  Jeryl Columbia, NP-C [facility] Pager [phone]
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
## 2014-06-30 DIAGNOSIS — F3170 Bipolar disorder, currently in remission, most recent episode unspecified: Secondary | ICD-10-CM

## 2014-06-30 LAB — LEGIONELLA ANTIGEN, URINE

## 2014-06-30 LAB — PROTIME-INR
INR: 2.49 — AB (ref 0.00–1.49)
PROTHROMBIN TIME: 27.1 s — AB (ref 11.6–15.2)

## 2014-06-30 MED ORDER — IPRATROPIUM-ALBUTEROL 0.5-2.5 (3) MG/3ML IN SOLN
3.0000 mL | RESPIRATORY_TRACT | Status: DC | PRN
  Administered 2014-07-02 – 2014-07-03 (×3): 3 mL via RESPIRATORY_TRACT
  Filled 2014-06-30 (×3): qty 3

## 2014-06-30 MED ORDER — SODIUM CHLORIDE 0.9 % IV SOLN
1.0000 g | Freq: Three times a day (TID) | INTRAVENOUS | Status: DC
  Administered 2014-07-01 – 2014-07-03 (×8): 1 g via INTRAVENOUS
  Filled 2014-06-30 (×15): qty 1

## 2014-06-30 MED ORDER — ONDANSETRON HCL 4 MG/2ML IJ SOLN: 4.0000 mg | Freq: Four times a day (QID) | INTRAMUSCULAR | Status: DC | PRN

## 2014-06-30 MED ORDER — POLYETHYLENE GLYCOL 3350 17 G PO PACK
PACK | ORAL | Status: AC
  Filled 2014-06-30: qty 1

## 2014-06-30 MED ORDER — DOCUSATE SODIUM 100 MG PO CAPS
200.0000 mg | ORAL_CAPSULE | Freq: Two times a day (BID) | ORAL | Status: DC
  Administered 2014-06-30 – 2014-07-03 (×6): 200 mg via ORAL
  Filled 2014-06-30 (×5): qty 2

## 2014-06-30 MED ORDER — IPRATROPIUM-ALBUTEROL 0.5-2.5 (3) MG/3ML IN SOLN
3.0000 mL | RESPIRATORY_TRACT | Status: DC
  Administered 2014-06-30 – 2014-07-01 (×6): 3 mL via RESPIRATORY_TRACT

## 2014-06-30 MED ORDER — WARFARIN SODIUM 2 MG PO TABS
ORAL_TABLET | ORAL | Status: AC
  Filled 2014-06-30: qty 1

## 2014-06-30 MED ORDER — POLYETHYLENE GLYCOL 3350 17 G PO PACK
17.0000 g | PACK | Freq: Two times a day (BID) | ORAL | Status: DC
  Administered 2014-06-30 – 2014-07-02 (×4): 17 g via ORAL
  Filled 2014-06-30 (×5): qty 1

## 2014-06-30 MED ORDER — DOCUSATE SODIUM 100 MG PO CAPS
ORAL_CAPSULE | ORAL | Status: AC
  Filled 2014-06-30: qty 1

## 2014-06-30 MED ORDER — WARFARIN SODIUM 2 MG PO TABS
2.0000 mg | ORAL_TABLET | Freq: Once | ORAL | Status: AC
  Administered 2014-06-30: 2 mg via ORAL

## 2014-06-30 NOTE — Care Management (Signed)
Medicare Important Message given? YES   (If response is "NO", the following Medicare IM given date fields will be blank)   Date Medicare IM given:   Medicare IM given by: Graves-Bigelow, Evgenia Merriman  

## 2014-06-30 NOTE — Plan of Care (Signed)
Problem: Phase I Progression Outcomes Goal: Pain controlled with appropriate interventions Outcome: Progressing Goal: OOB as tolerated unless otherwise ordered Outcome: Not Applicable Date Met:  87/57/97 Goal: Initial discharge plan identified Outcome: Completed/Met Date Met:  06/30/14 Goal: Hemodynamically stable Outcome: Completed/Met Date Met:  06/30/14

## 2014-06-30 NOTE — Progress Notes (Addendum)
PROGRESS NOTE  Samuel [name] PXT:[id number] DOB: 09/14/1955 DOA: 06/28/2014 PCP: [name], MD  Assessment/Plan: HCAP (healthcare-associated pneumonia) -from ALF so broad spectrum antibiotics -sputum culture -SLP eval as recurrent PNA- regular/thin  HTN (hypertension) - hold home meds  Bipolar disorder - continue  home meds   Sepsis -  - blood cultures, pro-calcitonin elevated,  lactic acid elevated but improved  Chronic pain - continue home meds -place on bowel regimen  Leukocytosis -monitor  Code Status: full Family Communication:  Disposition Plan: from ALF   Consultants:    Procedures:      HPI/Subjective: Feeling better after breathing treatment  Objective: Filed Vitals:   06/30/14 0818  BP:   Pulse:   Temp: 98.3 F (36.8 C)  Resp:     Intake/Output Summary (Last 24 hours) at 06/30/14 0908 Last data filed at 06/30/14 0841  Gross per 24 hour  Intake 2048.75 ml  Output   2900 ml  Net -851.25 ml   Filed Weights   06/28/14 2233 06/29/14 0545 06/30/14 0402  Weight: 108.863 kg (240 lb) 115.667 kg (255 lb) 117.476 kg (258 lb 15.8 oz)    Exam:   General:  A+Ox3, NAD  Cardiovascular: rrr  Respiratory: no wheezing  Abdomen: +BS, ostomy   Data Reviewed: Basic Metabolic Panel:  Recent Labs Lab 06/28/14 2051 06/29/14 0703  NA 140 143  K 4.3 4.4  CL 101 108  CO2 22 23  GLUCOSE 130* 121*  BUN 11 11  CREATININE 0.85 0.80  CALCIUM 9.2 8.2*   Liver Function Tests:  Recent Labs Lab 06/29/14 0703  AST 15  ALT 11  ALKPHOS 65  BILITOT 0.4  PROT 6.3  ALBUMIN 2.9*   No results for input(s): LIPASE, AMYLASE in the last 168 hours. No results for input(s): AMMONIA in the last 168 hours. CBC:  Recent Labs Lab 06/28/14 2051  WBC 16.6*  NEUTROABS 14.6*  HGB 15.1  HCT 46.0  MCV 86.5  PLT 250   Cardiac Enzymes: No results for input(s): CKTOTAL, CKMB, CKMBINDEX, TROPONINI in the last 168 hours. BNP (last 3  results)  Recent Labs  09/19/13 1415 12/07/13 2221  PROBNP 123.8 56.9   CBG:  Recent Labs Lab 06/29/14 0728 06/29/14 1132 06/29/14 1709  GLUCAP 106* 136* 149*    Recent Results (from the past 240 hour(s))  Culture, blood (routine x 2)     Status: None (Preliminary result)   Collection Time: 06/28/14  8:50 PM  Result Value Ref Range Status   Specimen Description BLOOD HAND RIGHT  Final   Special Requests BOTTLES DRAWN AEROBIC AND ANAEROBIC 5CC  Final   Culture  Setup Time   Final    06/29/2014 04:32 Performed at [facility]    Culture   Final           BLOOD CULTURE RECEIVED NO GROWTH TO DATE CULTURE WILL BE HELD FOR 5 DAYS BEFORE ISSUING A FINAL NEGATIVE REPORT Performed at [facility]    Report Status PENDING  Incomplete  Urine culture     Status: None (Preliminary result)   Collection Time: 06/28/14  9:03 PM  Result Value Ref Range Status   Specimen Description URINE, RANDOM  Final   Special Requests NONE  Final   Culture  Setup Time   Final    06/29/2014 04:48 Performed at [facility]   Final    >=100,000 COLONIES/ML Performed at [facility]  Final    GRAM NEGATIVE RODS Performed at [facility]    Report Status PENDING  Incomplete  Culture, blood (routine x 2)     Status: None (Preliminary result)   Collection Time: 06/28/14 10:05 PM  Result Value Ref Range Status   Specimen Description BLOOD RIGHT HAND  Final   Special Requests BOTTLES DRAWN AEROBIC ONLY 10CC  Final   Culture  Setup Time   Final    06/29/2014 04:28 Performed at [facility]    Culture   Final           BLOOD CULTURE RECEIVED NO GROWTH TO DATE CULTURE WILL BE HELD FOR 5 DAYS BEFORE ISSUING A FINAL NEGATIVE REPORT Performed at [facility]    Report Status PENDING  Incomplete  MRSA PCR Screening     Status: Abnormal   Collection Time: 06/29/14  6:19 AM  Result Value Ref Range Status   MRSA by  PCR POSITIVE (A) NEGATIVE Final    Comment:        The GeneXpert MRSA Assay (FDA approved for NASAL specimens only), is one component of a comprehensive MRSA colonization surveillance program. It is not intended to diagnose MRSA infection nor to guide or monitor treatment for MRSA infections. CRITICAL RESULT CALLED TO, READ BACK BY AND VERIFIED WITH: HUBER C RN 06/29/14 0840 COSTELLO B      Studies: Dg Chest Port 1 View  06/28/2014   CLINICAL DATA:  Dyspnea and fever  EXAM: PORTABLE CHEST - 1 VIEW  COMPARISON:  Dec 07, 2013  FINDINGS: There is underlying emphysematous change. There is patchy consolidation in the left base. Lungs elsewhere clear. Heart is upper normal in size with pulmonary vascularity within normal limits. There is degenerative change in the thoracic spine.  IMPRESSION: Patchy infiltrate left base. Underlying emphysematous change. Heart upper normal in size.   Electronically Signed   By: Lowella Grip M.D.   On: 06/28/2014 22:02    Scheduled Meds: . baclofen  20 mg Oral TID  . ceFEPime (MAXIPIME) IV  1 g Intravenous 3 times per day  . Chlorhexidine Gluconate Cloth  6 each Topical Q0600  . clonazePAM  0.5 mg Oral TID  . DULoxetine  60 mg Oral QHS  . fentaNYL  75 mcg Transdermal Q72H  . ipratropium-albuterol  3 mL Nebulization Q4H  . lamoTRIgine  200 mg Oral BID  . mupirocin ointment  1 application Nasal BID  . oxybutynin  5 mg Oral Daily  . pantoprazole  40 mg Oral Daily  . polyvinyl alcohol  2 drop Both Eyes TID  . predniSONE  10 mg Oral Q breakfast  . pregabalin  75 mg Oral BID  . QUEtiapine  200 mg Oral Daily   And  . QUEtiapine  100 mg Oral QHS  . vancomycin  1,000 mg Intravenous Q12H  . Warfarin - Pharmacist Dosing Inpatient   Does not apply q1800   Continuous Infusions: . sodium chloride 75 mL/hr at 06/30/14 0241   Antibiotics Given (last 72 hours)    Date/Time Action Medication Dose Rate   06/29/14 0812 Given   ceFEPIme (MAXIPIME) 1 g in  dextrose 5 % 50 mL IVPB 1 g 100 mL/hr   06/29/14 0949 Given   vancomycin (VANCOCIN) IVPB 1000 mg/200 mL premix 1,000 mg 200 mL/hr   06/29/14 1402 Given   ceFEPIme (MAXIPIME) 1 g in dextrose 5 % 50 mL IVPB 1 g 100 mL/hr   06/29/14 2119 Given  ceFEPIme (MAXIPIME) 1 g in dextrose 5 % 50 mL IVPB 1 g 100 mL/hr   06/29/14 2236 Given   vancomycin (VANCOCIN) IVPB 1000 mg/200 mL premix 1,000 mg 200 mL/hr   06/30/14 0544 Given   ceFEPIme (MAXIPIME) 1 g in dextrose 5 % 50 mL IVPB 1 g 100 mL/hr      Active Problems:   Bipolar disorder   Chronic pain   Sepsis   HTN (hypertension)   HCAP (healthcare-associated pneumonia)   Pneumonia    Time spent: 31 min    Kemonte Ullman  [facility] Pager [phone]. If 7PM-7AM, please contact night-coverage at [email or web address], password [facility] 06/30/2014, 9:08 AM  LOS: 2 days

## 2014-06-30 NOTE — Progress Notes (Addendum)
CSW (Clinical Education officer, museum) left voicemail for facility notifying of potential for pt to dc over weekend. Awaiting call back with weekend contact/fax information.  ADDENDUM: CSW spoke with [place] at [facility]. She confirmed pt can dc back over weekend if needed. Weekend CSW will need to fax dc summary to [phone] ATTN: [place].  [place], [place]

## 2014-06-30 NOTE — Progress Notes (Addendum)
ANTICOAGULATION CONSULT NOTE - Follow Up Consult  Pharmacy Consult for coumadin Indication: hx DVT/PE  Allergies  Allergen Reactions  . Other Other (See Comments)    Antibiotics, various ones stopped working-per patient (overuse?)    Patient Measurements: Height: 5\' 4"  (162.6 cm) Weight: 258 lb 15.8 oz (117.476 kg) IBW/kg (Calculated) : 59.2   Vital Signs: Temp: 98.3 F (36.8 C) (12/04 0818) Temp Source: Oral (12/04 0818) BP: 122/80 mmHg (12/04 0319) Pulse Rate: 92 (12/04 0319)  Labs:  Recent Labs  06/28/14 2051 06/29/14 0703 06/30/14 0422  HGB 15.1  --   --   HCT 46.0  --   --   PLT 250  --   --   LABPROT 20.2*  --  27.1*  INR 1.70*  --  2.49*  CREATININE 0.85 0.80  --     Estimated Creatinine Clearance: 120.3 mL/min (by C-G formula based on Cr of 0.8).  Assessment: Patient is a 58 y.o M on coumadin for hx PE/DVT.  INR is therapeutic but increased from 1.70 to 2.49 today after 1 dose of 5 mg given yesterday.  No bleeding documented.  Goal of Therapy:  INR 2-3    Plan:  1) decrease coumadin dose to 2mg  PO x1 today  Lloyd Ayo P 06/30/2014,9:01 AM  Adden: Patient's currently on vancomycin, levaquin, and cefepime for HCAP and UTI.  To change levaquin and cefepime to merrem today.  Will start merrem 1gm IV q8h. Continue vancomycin 1gm IV q12h.  12/02 Vanc>> (LD 2g) 12/2 cefepime>> 12/4 12/3 LVQ>> 12/4 12/4 merrem>>  BCx>> ngtd UCx >100 GNR MRSA PCR (+) Strep Pneumo (-)

## 2014-07-01 DIAGNOSIS — R0600 Dyspnea, unspecified: Secondary | ICD-10-CM | POA: Insufficient documentation

## 2014-07-01 LAB — BASIC METABOLIC PANEL
ANION GAP: 12 (ref 5–15)
BUN: 7 mg/dL (ref 6–23)
CALCIUM: 8.3 mg/dL — AB (ref 8.4–10.5)
CO2: 23 mEq/L (ref 19–32)
CREATININE: 0.67 mg/dL (ref 0.50–1.35)
Chloride: 104 mEq/L (ref 96–112)
Glucose, Bld: 106 mg/dL — ABNORMAL HIGH (ref 70–99)
Potassium: 3.6 mEq/L — ABNORMAL LOW (ref 3.7–5.3)
Sodium: 139 mEq/L (ref 137–147)

## 2014-07-01 LAB — CBC
HCT: 36 % — ABNORMAL LOW (ref 39.0–52.0)
Hemoglobin: 11.3 g/dL — ABNORMAL LOW (ref 13.0–17.0)
MCH: 27 pg (ref 26.0–34.0)
MCHC: 31.4 g/dL (ref 30.0–36.0)
MCV: 86.1 fL (ref 78.0–100.0)
Platelets: 200 10*3/uL (ref 150–400)
RBC: 4.18 MIL/uL — ABNORMAL LOW (ref 4.22–5.81)
RDW: 14.2 % (ref 11.5–15.5)
WBC: 9.8 10*3/uL (ref 4.0–10.5)

## 2014-07-01 LAB — PROCALCITONIN: Procalcitonin: 1.28 ng/mL

## 2014-07-01 LAB — PROTIME-INR
INR: 2.75 — ABNORMAL HIGH (ref 0.00–1.49)
Prothrombin Time: 29.3 seconds — ABNORMAL HIGH (ref 11.6–15.2)

## 2014-07-01 LAB — VANCOMYCIN, TROUGH: Vancomycin Tr: 14.2 ug/mL (ref 10.0–20.0)

## 2014-07-01 MED ORDER — IPRATROPIUM-ALBUTEROL 0.5-2.5 (3) MG/3ML IN SOLN
3.0000 mL | Freq: Three times a day (TID) | RESPIRATORY_TRACT | Status: DC
  Administered 2014-07-01 – 2014-07-03 (×6): 3 mL via RESPIRATORY_TRACT
  Filled 2014-07-01 (×7): qty 3

## 2014-07-01 MED ORDER — FERROUS SULFATE 325 (65 FE) MG PO TABS
325.0000 mg | ORAL_TABLET | Freq: Two times a day (BID) | ORAL | Status: DC
Start: 2014-07-01 — End: 2014-07-03
  Administered 2014-07-01 – 2014-07-03 (×4): 325 mg via ORAL
  Filled 2014-07-01 (×4): qty 1

## 2014-07-01 MED ORDER — WARFARIN SODIUM 2.5 MG PO TABS
2.5000 mg | ORAL_TABLET | Freq: Once | ORAL | Status: AC
  Administered 2014-07-01: 2.5 mg via ORAL
  Filled 2014-07-01: qty 1

## 2014-07-01 MED ORDER — OXYBUTYNIN CHLORIDE 5 MG PO TABS
ORAL_TABLET | ORAL | Status: AC
  Filled 2014-07-01: qty 1

## 2014-07-01 MED ORDER — POTASSIUM CHLORIDE CRYS ER 20 MEQ PO TBCR
EXTENDED_RELEASE_TABLET | ORAL | Status: AC
  Filled 2014-07-01: qty 1

## 2014-07-01 MED ORDER — FOLIC ACID 1 MG PO TABS
1.0000 mg | ORAL_TABLET | Freq: Every day | ORAL | Status: DC
  Administered 2014-07-01 – 2014-07-03 (×3): 1 mg via ORAL
  Filled 2014-07-01 (×3): qty 1

## 2014-07-01 MED ORDER — AMLODIPINE BESYLATE 5 MG PO TABS: 5.0000 mg | ORAL_TABLET | Freq: Every day | ORAL | Status: DC

## 2014-07-01 MED ORDER — VANCOMYCIN HCL 10 G IV SOLR
1250.0000 mg | Freq: Two times a day (BID) | INTRAVENOUS | Status: DC
  Administered 2014-07-02 – 2014-07-03 (×4): 1250 mg via INTRAVENOUS
  Filled 2014-07-01 (×5): qty 1250

## 2014-07-01 MED ORDER — POTASSIUM CHLORIDE CRYS ER 20 MEQ PO TBCR: 20.0000 meq | EXTENDED_RELEASE_TABLET | Freq: Once | ORAL | Status: DC

## 2014-07-01 MED ORDER — DOCUSATE SODIUM 100 MG PO CAPS
ORAL_CAPSULE | ORAL | Status: AC
  Administered 2014-07-01: 10:00:00
  Filled 2014-07-01: qty 2

## 2014-07-01 MED ORDER — VITAMIN B-12 1000 MCG PO TABS
1000.0000 ug | ORAL_TABLET | Freq: Every day | ORAL | Status: DC
  Administered 2014-07-01 – 2014-07-03 (×3): 1000 ug via ORAL
  Filled 2014-07-01 (×3): qty 1

## 2014-07-01 MED ORDER — ATORVASTATIN CALCIUM 10 MG PO TABS
10.0000 mg | ORAL_TABLET | Freq: Every day | ORAL | Status: DC
  Administered 2014-07-01 – 2014-07-02 (×2): 10 mg via ORAL
  Filled 2014-07-01 (×2): qty 1

## 2014-07-01 MED ORDER — GUAIFENESIN-DM 100-10 MG/5ML PO SYRP
ORAL_SOLUTION | ORAL | Status: AC
  Administered 2014-07-01: 5 mL via ORAL
  Filled 2014-07-01: qty 5

## 2014-07-01 MED ORDER — POLYETHYLENE GLYCOL 3350 17 G PO PACK
PACK | ORAL | Status: AC
  Administered 2014-07-01: 1
  Filled 2014-07-01: qty 1

## 2014-07-01 MED ORDER — GUAIFENESIN-DM 100-10 MG/5ML PO SYRP
5.0000 mL | ORAL_SOLUTION | ORAL | Status: DC | PRN
  Administered 2014-07-01 – 2014-07-03 (×7): 5 mL via ORAL
  Filled 2014-07-01 (×6): qty 5

## 2014-07-01 NOTE — Progress Notes (Signed)
ANTICOAGULATION CONSULT NOTE - Follow Up Consult  Pharmacy Consult for Coumadin Indication: hx DVT PE  Allergies  Allergen Reactions  . Other Other (See Comments)    Antibiotics, various ones stopped working-per patient (overuse?)    Patient Measurements: Height: 5\' 4"  (162.6 cm) Weight: 261 lb 6.4 oz (118.57 kg) IBW/kg (Calculated) : 59.2 Heparin Dosing Weight:   Vital Signs: Temp: 98.3 F (36.8 C) (12/05 0815) Temp Source: Oral (12/05 0815) BP: 137/88 mmHg (12/05 0815) Pulse Rate: 95 (12/05 0815)  Labs:  Recent Labs  06/28/14 2051 06/29/14 0703 06/30/14 0422 07/01/14 0540  HGB 15.1  --   --  11.3*  HCT 46.0  --   --  36.0*  PLT 250  --   --  200  LABPROT 20.2*  --  27.1* 29.3*  INR 1.70*  --  2.49* 2.75*  CREATININE 0.85 0.80  --  0.67    Estimated Creatinine Clearance: 121 mL/min (by C-G formula based on Cr of 0.67).   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 INR (2.75) remains therapeutic.  - H/H and Plts trending down - monitor - No significant bleeding reported  Goal of Therapy:  INR 2-3   Plan:  1. Coumadin 2.5mg  PO x 1 today 2. Daily INR 3. Will check Vancomycin trough tonight  Earleen Newport  [phone] 07/01/2014,9:37 AM

## 2014-07-01 NOTE — Progress Notes (Signed)
Chart reviewed.   PROGRESS NOTE  Samuel [name] RKY:[id number] DOB: [birth year] DOA: 06/28/2014 PCP: [name], MD  Assessment/Plan: HCAP (healthcare-associated pneumonia) Continue vancomycin, meropenum through the weekend, then back to SNF some time next week if stable. procalcitonin coming down.  No fever x 24 h.  HTN (hypertension) - resume amblodipine  Bipolar disorder - continue  home meds   Sepsis stabilizing  Chronic pain - continue home meds -place on bowel regimen  paraplegia  Enterococcus in urine: likely colonized. Minimal WBC in urine  Code Status: full Family Communication:  Disposition Plan: SNF next week  HPI/Subjective: No complaints  Objective: Filed Vitals:   07/01/14 0815  BP: 137/88  Pulse: 95  Temp: 98.3 F (36.8 C)  Resp: 27    Intake/Output Summary (Last 24 hours) at 07/01/14 1232 Last data filed at 07/01/14 1101  Gross per 24 hour  Intake      0 ml  Output   4200 ml  Net  -4200 ml   Filed Weights   06/29/14 0545 06/30/14 0402 07/01/14 0040  Weight: 115.667 kg (255 lb) 117.476 kg (258 lb 15.8 oz) 118.57 kg (261 lb 6.4 oz)    Exam:   General:  Somnolent. arousable  Cardiovascular: rrr without MGR  Respiratory: no wheezing rhonchi, rales  Abdomen: +BS, ostomy, nontender  Ext: left leg amputated. edema.   Data Reviewed: Basic Metabolic Panel:  Recent Labs Lab 06/28/14 2051 06/29/14 0703 07/01/14 0540  NA 140 143 139  K 4.3 4.4 3.6*  CL 101 108 104  CO2 22 23 23   GLUCOSE 130* 121* 106*  BUN 11 11 7   CREATININE 0.85 0.80 0.67  CALCIUM 9.2 8.2* 8.3*   Liver Function Tests:  Recent Labs Lab 06/29/14 0703  AST 15  ALT 11  ALKPHOS 65  BILITOT 0.4  PROT 6.3  ALBUMIN 2.9*   No results for input(s): LIPASE, AMYLASE in the last 168 hours. No results for input(s): AMMONIA in the last 168 hours. CBC:  Recent Labs Lab 06/28/14 2051 07/01/14 0540  WBC 16.6* 9.8  NEUTROABS 14.6*  --   HGB 15.1  11.3*  HCT 46.0 36.0*  MCV 86.5 86.1  PLT 250 200   Cardiac Enzymes: No results for input(s): CKTOTAL, CKMB, CKMBINDEX, TROPONINI in the last 168 hours. BNP (last 3 results)  Recent Labs  09/19/13 1415 12/07/13 2221  PROBNP 123.8 56.9   CBG:  Recent Labs Lab 06/29/14 0728 06/29/14 1132 06/29/14 1709  GLUCAP 106* 136* 149*    Recent Results (from the past 240 hour(s))  Culture, blood (routine x 2)     Status: None (Preliminary result)   Collection Time: 06/28/14  8:50 PM  Result Value Ref Range Status   Specimen Description BLOOD HAND RIGHT  Final   Special Requests BOTTLES DRAWN AEROBIC AND ANAEROBIC 5CC  Final   Culture  Setup Time   Final    06/29/2014 04:32 Performed at [facility]    Culture   Final           BLOOD CULTURE RECEIVED NO GROWTH TO DATE CULTURE WILL BE HELD FOR 5 DAYS BEFORE ISSUING A FINAL NEGATIVE REPORT Performed at [facility]    Report Status PENDING  Incomplete  Urine culture     Status: None (Preliminary result)   Collection Time: 06/28/14  9:03 PM  Result Value Ref Range Status   Specimen Description URINE, RANDOM  Final   Special Requests NONE  Final   Culture  Setup Time   Final    06/29/2014 04:48 Performed at [place]   Final    >=100,000 COLONIES/ML Performed at [facility]    Culture   Final    [place] Performed at [facility]    Report Status PENDING  Incomplete  Culture, blood (routine x 2)     Status: None (Preliminary result)   Collection Time: 06/28/14 10:05 PM  Result Value Ref Range Status   Specimen Description BLOOD RIGHT HAND  Final   Special Requests BOTTLES DRAWN AEROBIC ONLY 10CC  Final   Culture  Setup Time   Final    06/29/2014 04:28 Performed at [facility]    Culture   Final           BLOOD CULTURE RECEIVED NO GROWTH TO DATE CULTURE WILL BE HELD FOR 5 DAYS BEFORE ISSUING A FINAL NEGATIVE  REPORT Performed at [facility]    Report Status PENDING  Incomplete  MRSA PCR Screening     Status: Abnormal   Collection Time: 06/29/14  6:19 AM  Result Value Ref Range Status   MRSA by PCR POSITIVE (A) NEGATIVE Final    Comment:        The GeneXpert MRSA Assay (FDA approved for NASAL specimens only), is one component of a comprehensive MRSA colonization surveillance program. It is not intended to diagnose MRSA infection nor to guide or monitor treatment for MRSA infections. CRITICAL RESULT CALLED TO, READ BACK BY AND VERIFIED WITH: HUBER C RN 06/29/14 0840 COSTELLO B      Studies: No results found.  Scheduled Meds: . baclofen  20 mg Oral TID  . Chlorhexidine Gluconate Cloth  6 each Topical Q0600  . clonazePAM  0.5 mg Oral TID  . docusate sodium  200 mg Oral BID  . DULoxetine  60 mg Oral QHS  . fentaNYL  75 mcg Transdermal Q72H  . ipratropium-albuterol  3 mL Nebulization TID  . lamoTRIgine  200 mg Oral BID  . meropenem (MERREM) IV  1 g Intravenous Q8H  . mupirocin ointment  1 application Nasal BID  . oxybutynin  5 mg Oral Daily  . pantoprazole  40 mg Oral Daily  . polyethylene glycol  17 g Oral BID  . polyvinyl alcohol  2 drop Both Eyes TID  . potassium chloride SA      . potassium chloride  20 mEq Oral Once  . predniSONE  10 mg Oral Q breakfast  . pregabalin  75 mg Oral BID  . QUEtiapine  200 mg Oral Daily   And  . QUEtiapine  100 mg Oral QHS  . vancomycin  1,000 mg Intravenous Q12H  . warfarin  2.5 mg Oral ONCE-1800  . Warfarin - Pharmacist Dosing Inpatient   Does not apply q1800   Continuous Infusions: . sodium chloride 75 mL (06/30/14 1235)   Antibiotics Given (last 72 hours)    Date/Time Action Medication Dose Rate   06/29/14 0812 Given   ceFEPIme (MAXIPIME) 1 g in dextrose 5 % 50 mL IVPB 1 g 100 mL/hr   06/29/14 0949 Given   vancomycin (VANCOCIN) IVPB 1000 mg/200 mL premix 1,000 mg 200 mL/hr   06/29/14 1402 Given   ceFEPIme (MAXIPIME) 1 g  in dextrose 5 % 50 mL IVPB 1 g 100 mL/hr   06/29/14 2119 Given   ceFEPIme (MAXIPIME) 1 g in dextrose 5 % 50 mL IVPB 1 g  100 mL/hr   06/29/14 2236 Given   vancomycin (VANCOCIN) IVPB 1000 mg/200 mL premix 1,000 mg 200 mL/hr   06/30/14 0544 Given   ceFEPIme (MAXIPIME) 1 g in dextrose 5 % 50 mL IVPB 1 g 100 mL/hr   06/30/14 1051 Given   vancomycin (VANCOCIN) IVPB 1000 mg/200 mL premix 1,000 mg 200 mL/hr   06/30/14 2212 Given   vancomycin (VANCOCIN) IVPB 1000 mg/200 mL premix 1,000 mg 200 mL/hr   07/01/14 0352 Given   meropenem (MERREM) 1 g in sodium chloride 0.9 % 100 mL IVPB 1 g 200 mL/hr   07/01/14 0935 Given   vancomycin (VANCOCIN) IVPB 1000 mg/200 mL premix 1,000 mg 200 mL/hr   07/01/14 1050 Given   meropenem (MERREM) 1 g in sodium chloride 0.9 % 100 mL IVPB 1 g 200 mL/hr     Time spent: 25 min  [facility] Text page [email or web address], password [email or web address] 07/01/2014, 12:32 PM  LOS: 3 days

## 2014-07-01 NOTE — Clinical Social Work Note (Signed)
CSW continues to follow for d/c planning needs. CSW spoke with patient's RN Charlett Nose) who reports patient has not been seen by MD. Charlett Nose to follow up with CSW for disposition.   [place], [place] Weekend Clinical Social Worker [phone]

## 2014-07-01 NOTE — Progress Notes (Signed)
ANTIBIOTIC CONSULT NOTE - FOLLOW UP  Pharmacy Consult for Vancomycin and Meropenem Indication: pneumonia  Allergies  Allergen Reactions  . Other Other (See Comments)    Antibiotics, various ones stopped working-per patient (overuse?)    Patient Measurements: Height: 5\' 4"  (162.6 cm) Weight: 261 lb 6.4 oz (118.57 kg) IBW/kg (Calculated) : 59.2  Vital Signs: Temp: 97.9 F (36.6 C) (12/05 2000) BP: 82/49 mmHg (12/05 1500) Pulse Rate: 88 (12/05 1500) Intake/Output from previous day: 12/04 0701 - 12/05 0700 In: 240 [P.O.:240] Out: 3200 [Urine:3200] Intake/Output from this shift: Total I/O In: -  Out: 500 [Urine:500]  Labs:  Recent Labs  06/29/14 0703 07/01/14 0540  WBC  --  9.8  HGB  --  11.3*  PLT  --  200  CREATININE 0.80 0.67   Estimated Creatinine Clearance: 121 mL/min (by C-G formula based on Cr of 0.67).  Recent Labs  07/01/14 2125  VANCOTROUGH 14.2     Assessment: 57yom continues on day #4 vancomycin and meropenem for HCAP. Vancomycin trough is slightly below goal at 14.2 on 1g IV q12. Renal function is stable.   Goal of Therapy:  Vancomycin trough level 15-20 mcg/ml  Plan:  1) Increase vancomycin to 1250mg  IV q12 for predicted trough of ~17.5 2) Continue meropenem 1g IV q8  Deboraha Sprang 07/01/2014,10:17 PM

## 2014-07-01 NOTE — Clinical Social Work Note (Signed)
CSW has reviewed patient's chart, and patient is not ready for d/c on this date per MD Conley Canal). CSW to continue to follow for d/c planning.  [place], [place] Weekend Clinical Social Worker [phone]

## 2014-07-02 DIAGNOSIS — R400 Somnolence: Secondary | ICD-10-CM

## 2014-07-02 LAB — URINE CULTURE

## 2014-07-02 LAB — PROTIME-INR
INR: 2.62 — ABNORMAL HIGH (ref 0.00–1.49)
PROTHROMBIN TIME: 28.2 s — AB (ref 11.6–15.2)

## 2014-07-02 MED ORDER — WARFARIN SODIUM 3 MG PO TABS
3.0000 mg | ORAL_TABLET | Freq: Once | ORAL | Status: AC
  Administered 2014-07-02: 3 mg via ORAL
  Filled 2014-07-02: qty 1

## 2014-07-02 MED ORDER — CLONAZEPAM 0.5 MG PO TABS
0.5000 mg | ORAL_TABLET | Freq: Three times a day (TID) | ORAL | Status: DC | PRN
  Administered 2014-07-03: 0.5 mg via ORAL
  Filled 2014-07-02: qty 1

## 2014-07-02 MED ORDER — BACLOFEN 10 MG PO TABS
10.0000 mg | ORAL_TABLET | Freq: Three times a day (TID) | ORAL | Status: DC
  Administered 2014-07-02 – 2014-07-03 (×5): 10 mg via ORAL
  Filled 2014-07-02 (×5): qty 1

## 2014-07-02 MED ORDER — MORPHINE SULFATE 2 MG/ML IJ SOLN
2.0000 mg | Freq: Once | INTRAMUSCULAR | Status: AC
  Administered 2014-07-02: 2 mg via INTRAVENOUS
  Filled 2014-07-02: qty 1

## 2014-07-02 MED ORDER — OXYCODONE HCL 5 MG PO TABS
5.0000 mg | ORAL_TABLET | Freq: Four times a day (QID) | ORAL | Status: DC | PRN
  Administered 2014-07-03 (×2): 5 mg via ORAL
  Filled 2014-07-02 (×2): qty 1

## 2014-07-02 NOTE — Progress Notes (Signed)
Chart reviewed.   PROGRESS NOTE  Samuel [name] GYF:[id number] DOB: Oct 11, 1955 DOA: 06/28/2014 PCP: [name], MD  Assessment/Plan: HCAP (healthcare-associated pneumonia) Continue vancomycin, meropenum through the weekend, then back to SNF some time next week if stable. procalcitonin coming down.  No fever x 48 h.  Overly sedated.  Cut back on sedating meds  HTN (hypertension) - resume amblodipine  Bipolar disorder   Sepsis stabilizing  Chronic pain - continue home meds -place on bowel regimen  paraplegia  Enterococcus and GNR in urine: likely colonized. Minimal WBC in urine  Code Status: full Family Communication:  Disposition Plan: SNF next week  HPI/Subjective: No complaints  Objective: Filed Vitals:   07/02/14 0941  BP: 142/87  Pulse: 102  Temp: 98.8 F (37.1 C)  Resp: 22    Intake/Output Summary (Last 24 hours) at 07/02/14 1329 Last data filed at 07/02/14 0300  Gross per 24 hour  Intake      0 ml  Output   2300 ml  Net  -2300 ml   Filed Weights   06/30/14 0402 07/01/14 0040 07/02/14 0412  Weight: 117.476 kg (258 lb 15.8 oz) 118.57 kg (261 lb 6.4 oz) 118.162 kg (260 lb 8 oz)    Exam:   General:  Somnolent. Arousable. Mac and cheese still in mouth from lunch  Cardiovascular: rrr without MGR  Respiratory: no wheezing rhonchi, rales  Abdomen: +BS, ostomy, nontender  Ext: left leg amputated. edema.   Data Reviewed: Basic Metabolic Panel:  Recent Labs Lab 06/28/14 2051 06/29/14 0703 07/01/14 0540  NA 140 143 139  K 4.3 4.4 3.6*  CL 101 108 104  CO2 22 23 23   GLUCOSE 130* 121* 106*  BUN 11 11 7   CREATININE 0.85 0.80 0.67  CALCIUM 9.2 8.2* 8.3*   Liver Function Tests:  Recent Labs Lab 06/29/14 0703  AST 15  ALT 11  ALKPHOS 65  BILITOT 0.4  PROT 6.3  ALBUMIN 2.9*   No results for input(s): LIPASE, AMYLASE in the last 168 hours. No results for input(s): AMMONIA in the last 168 hours. CBC:  Recent Labs Lab  06/28/14 2051 07/01/14 0540  WBC 16.6* 9.8  NEUTROABS 14.6*  --   HGB 15.1 11.3*  HCT 46.0 36.0*  MCV 86.5 86.1  PLT 250 200   Cardiac Enzymes: No results for input(s): CKTOTAL, CKMB, CKMBINDEX, TROPONINI in the last 168 hours. BNP (last 3 results)  Recent Labs  09/19/13 1415 12/07/13 2221  PROBNP 123.8 56.9   CBG:  Recent Labs Lab 06/29/14 0728 06/29/14 1132 06/29/14 1709  GLUCAP 106* 136* 149*    Recent Results (from the past 240 hour(s))  Culture, blood (routine x 2)     Status: None (Preliminary result)   Collection Time: 06/28/14  8:50 PM  Result Value Ref Range Status   Specimen Description BLOOD HAND RIGHT  Final   Special Requests BOTTLES DRAWN AEROBIC AND ANAEROBIC 5CC  Final   Culture  Setup Time   Final    06/29/2014 04:32 Performed at [facility]    Culture   Final           BLOOD CULTURE RECEIVED NO GROWTH TO DATE CULTURE WILL BE HELD FOR 5 DAYS BEFORE ISSUING A FINAL NEGATIVE REPORT Performed at [facility]    Report Status PENDING  Incomplete  Urine culture     Status: None (Preliminary result)   Collection Time: 06/28/14  9:03 PM  Result Value Ref Range Status   Specimen  Description URINE, RANDOM  Final   Special Requests NONE  Final   Culture  Setup Time   Final    06/29/2014 04:48 Performed at [place]   Final    >=100,000 COLONIES/ML Performed at [facility]    Culture   Final    [place] Performed at [facility]    Report Status PENDING  Incomplete  Culture, blood (routine x 2)     Status: None (Preliminary result)   Collection Time: 06/28/14 10:05 PM  Result Value Ref Range Status   Specimen Description BLOOD RIGHT HAND  Final   Special Requests BOTTLES DRAWN AEROBIC ONLY 10CC  Final   Culture  Setup Time   Final    06/29/2014 04:28 Performed at [facility]    Culture   Final           BLOOD CULTURE RECEIVED NO GROWTH TO  DATE CULTURE WILL BE HELD FOR 5 DAYS BEFORE ISSUING A FINAL NEGATIVE REPORT Performed at [facility]    Report Status PENDING  Incomplete  MRSA PCR Screening     Status: Abnormal   Collection Time: 06/29/14  6:19 AM  Result Value Ref Range Status   MRSA by PCR POSITIVE (A) NEGATIVE Final    Comment:        The GeneXpert MRSA Assay (FDA approved for NASAL specimens only), is one component of a comprehensive MRSA colonization surveillance program. It is not intended to diagnose MRSA infection nor to guide or monitor treatment for MRSA infections. CRITICAL RESULT CALLED TO, READ BACK BY AND VERIFIED WITH: HUBER C RN 06/29/14 0840 COSTELLO B      Studies: No results found.  Scheduled Meds: . atorvastatin  10 mg Oral QHS  . baclofen  20 mg Oral TID  . Chlorhexidine Gluconate Cloth  6 each Topical Q0600  . clonazePAM  0.5 mg Oral TID  . docusate sodium  200 mg Oral BID  . DULoxetine  60 mg Oral QHS  . fentaNYL  75 mcg Transdermal Q72H  . ferrous sulfate  325 mg Oral BID WC  . folic acid  1 mg Oral Daily  . ipratropium-albuterol  3 mL Nebulization TID  . lamoTRIgine  200 mg Oral BID  . meropenem (MERREM) IV  1 g Intravenous Q8H  . mupirocin ointment  1 application Nasal BID  . oxybutynin  5 mg Oral Daily  . pantoprazole  40 mg Oral Daily  . polyethylene glycol  17 g Oral BID  . polyvinyl alcohol  2 drop Both Eyes TID  . potassium chloride  20 mEq Oral Once  . predniSONE  10 mg Oral Q breakfast  . pregabalin  75 mg Oral BID  . QUEtiapine  200 mg Oral Daily   And  . QUEtiapine  100 mg Oral QHS  . vancomycin  1,250 mg Intravenous Q12H  . vitamin B-12  1,000 mcg Oral Daily  . warfarin  3 mg Oral ONCE-1800  . Warfarin - Pharmacist Dosing Inpatient   Does not apply q1800   Continuous Infusions: . sodium chloride 10 mL (07/01/14 1617)   Antibiotics Given (last 72 hours)    Date/Time Action Medication Dose Rate   06/29/14 1402 Given   ceFEPIme (MAXIPIME) 1 g in  dextrose 5 % 50 mL IVPB 1 g 100 mL/hr   06/29/14 2119 Given   ceFEPIme (MAXIPIME) 1 g in dextrose 5 % 50 mL  IVPB 1 g 100 mL/hr   06/29/14 2236 Given   vancomycin (VANCOCIN) IVPB 1000 mg/200 mL premix 1,000 mg 200 mL/hr   06/30/14 0544 Given   ceFEPIme (MAXIPIME) 1 g in dextrose 5 % 50 mL IVPB 1 g 100 mL/hr   06/30/14 1051 Given   vancomycin (VANCOCIN) IVPB 1000 mg/200 mL premix 1,000 mg 200 mL/hr   06/30/14 2212 Given   vancomycin (VANCOCIN) IVPB 1000 mg/200 mL premix 1,000 mg 200 mL/hr   07/01/14 0352 Given   meropenem (MERREM) 1 g in sodium chloride 0.9 % 100 mL IVPB 1 g 200 mL/hr   07/01/14 0935 Given   vancomycin (VANCOCIN) IVPB 1000 mg/200 mL premix 1,000 mg 200 mL/hr   07/01/14 1050 Given   meropenem (MERREM) 1 g in sodium chloride 0.9 % 100 mL IVPB 1 g 200 mL/hr   07/01/14 1902 Given   meropenem (MERREM) 1 g in sodium chloride 0.9 % 100 mL IVPB 1 g 200 mL/hr   07/02/14 0102 Given   vancomycin (VANCOCIN) 1,250 mg in sodium chloride 0.9 % 250 mL IVPB 1,250 mg 166.7 mL/hr   07/02/14 0322 Given   meropenem (MERREM) 1 g in sodium chloride 0.9 % 100 mL IVPB 1 g 200 mL/hr   07/02/14 1020 Given   meropenem (MERREM) 1 g in sodium chloride 0.9 % 100 mL IVPB 1 g 200 mL/hr   07/02/14 1022 Given   vancomycin (VANCOCIN) 1,250 mg in sodium chloride 0.9 % 250 mL IVPB 1,250 mg 166.7 mL/hr     Time spent: 15 min  [place] L  [facility] Text page [email or web address], password [facility] 07/02/2014, 1:29 PM  LOS: 4 days

## 2014-07-02 NOTE — Progress Notes (Signed)
ANTICOAGULATION CONSULT NOTE - Follow Up Consult  Pharmacy Consult for Coumadin Indication: hx DVT/PE  Allergies  Allergen Reactions  . Other Other (See Comments)    Antibiotics, various ones stopped working-per patient (overuse?)    Patient Measurements: Height: 5\' 4"  (162.6 cm) Weight: 260 lb 8 oz (118.162 kg) IBW/kg (Calculated) : 59.2  Vital Signs: Temp: 98.5 F (36.9 C) (12/06 0412) BP: 135/68 mmHg (12/06 0452)  Labs:  Recent Labs  06/30/14 0422 07/01/14 0540 07/02/14 0329  HGB  --  11.3*  --   HCT  --  36.0*  --   PLT  --  200  --   LABPROT 27.1* 29.3* 28.2*  INR 2.49* 2.75* 2.62*  CREATININE  --  0.67  --     Estimated Creatinine Clearance: 120.8 mL/min (by C-G formula based on Cr of 0.67).  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 INR (2.62) remains therapeutic. - No CBC this AM - No significant bleeding reported  Goal of Therapy:  INR 2-3   Plan:  1. Coumadin 3mg  PO x 1 today 2. Daily INR  Earleen Newport  [phone] 07/02/2014,9:15 AM

## 2014-07-03 LAB — PROTIME-INR
INR: 2.26 — ABNORMAL HIGH (ref 0.00–1.49)
Prothrombin Time: 25.1 seconds — ABNORMAL HIGH (ref 11.6–15.2)

## 2014-07-03 LAB — PROCALCITONIN: Procalcitonin: 0.37 ng/mL

## 2014-07-03 MED ORDER — HYDROCOD POLST-CHLORPHEN POLST 10-8 MG/5ML PO LQCR
5.0000 mL | Freq: Once | ORAL | Status: AC
  Administered 2014-07-03: 5 mL via ORAL
  Filled 2014-07-03: qty 5

## 2014-07-03 MED ORDER — WARFARIN SODIUM 3 MG PO TABS
3.0000 mg | ORAL_TABLET | Freq: Once | ORAL | Status: AC
  Administered 2014-07-03: 3 mg via ORAL
  Filled 2014-07-03: qty 1

## 2014-07-03 NOTE — Progress Notes (Signed)
CSW (Clinical Social Worker) prepared pt dc packet and placed with shadow chart. CSW arranged non-emergent ambulance transport. Pt, pt nurse, and facility informed. Pt declined for CSW to call family. CSW signing off.   Fannie Alomar, LCSWA [phone]  

## 2014-07-03 NOTE — Progress Notes (Signed)
CSW (Clinical Education officer, museum) notified facility of pt dc today.  Samuel [name], [place]

## 2014-07-03 NOTE — Progress Notes (Signed)
Called report to the nurse Maudie Mercury at [facility] in [place].
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
## 2014-07-04 ENCOUNTER — Non-Acute Institutional Stay (SKILLED_NURSING_FACILITY): Payer: PRIVATE HEALTH INSURANCE | Admitting: Internal Medicine

## 2014-07-04 DIAGNOSIS — K219 Gastro-esophageal reflux disease without esophagitis: Secondary | ICD-10-CM

## 2014-07-04 DIAGNOSIS — N390 Urinary tract infection, site not specified: Secondary | ICD-10-CM

## 2014-07-04 DIAGNOSIS — T8351XA Infection and inflammatory reaction due to indwelling urinary catheter, initial encounter: Secondary | ICD-10-CM

## 2014-07-04 DIAGNOSIS — J189 Pneumonia, unspecified organism: Secondary | ICD-10-CM

## 2014-07-04 DIAGNOSIS — T83511A Infection and inflammatory reaction due to indwelling urethral catheter, initial encounter: Secondary | ICD-10-CM

## 2014-07-05 ENCOUNTER — Ambulatory Visit (HOSPITAL_COMMUNITY)
Admission: RE | Admit: 2014-07-05 | Discharge: 2014-07-05 | Disposition: A | Discharge status: Home / Self Care | Payer: PRIVATE HEALTH INSURANCE | Source: Ambulatory Visit | Attending: Internal Medicine | Admitting: Internal Medicine

## 2014-07-05 DIAGNOSIS — R05 Cough: Secondary | ICD-10-CM | POA: Diagnosis not present

## 2014-07-05 DIAGNOSIS — R509 Fever, unspecified: Secondary | ICD-10-CM | POA: Diagnosis present

## 2014-07-05 DIAGNOSIS — R9389 Abnormal findings on diagnostic imaging of other specified body structures: Secondary | ICD-10-CM

## 2014-07-05 LAB — CULTURE, BLOOD (ROUTINE X 2)
CULTURE: NO GROWTH
Culture: NO GROWTH

## 2014-07-05 MED ORDER — IOHEXOL 300 MG/ML  SOLN
80.0000 mL | Freq: Once | INTRAMUSCULAR | Status: AC | PRN
  Administered 2014-07-05: 80 mL via INTRAVENOUS

## 2014-07-07 ENCOUNTER — Other Ambulatory Visit: Payer: Self-pay

## 2014-07-07 MED ORDER — FENTANYL 75 MCG/HR TD PT72: 75.0000 ug | MEDICATED_PATCH | TRANSDERMAL | Status: DC

## 2014-07-07 NOTE — Telephone Encounter (Signed)
nEIL mEDICAL gROUP

## 2014-07-07 NOTE — Progress Notes (Addendum)
Patient ID: Samuel [name], male   DOB: [birth year], 57 y.o.   MRN: [id number]               HISTORY & PHYSICAL  DATE:  07/04/2014     FACILITY: [facility]    LEVEL OF CARE:   SNF   CHIEF COMPLAINT:  Review, status post admission to [facility], 06/28/2014 through 07/03/2014.    HISTORY OF PRESENT ILLNESS:  It would appear that Samuel [name] was not well for the last part of the month of October.  He was treated for, I think, some form of upper respiratory tract infection on 1125/2015 with Levaquin 500 for 14 days.  A chest x-ray was ordered.  I do not see these results.  A urine was done and I do not see these results.  On 06/26/2014, a CT scan of the chest was ordered, though I do not think this was done.  He was discharged to hospital on 06/28/2014.    At the hospital, he arrived with healthcare-acquired pneumonia.  This was felt to be in the left lower lobe.  On arrival, his O2 sats were in the 63s, white count 16.6.  He was tachycardic at 125 and had a fever of 102.2.  He received vancomycin and meropenem for seven days.  His white count normalized.  Procalcitonin on admission was above 6, dropping down to 0.32.  A urine culture showed Acinetobacter and Enterococcus.  This was pan-resistant, only sensitive to aminoglycosides.  The Enterococcus was ampicillin-sensitive.  Blood culture was negative.  MRSA by PCR was positive.    The recent issues that the patient has had include being treated with Levaquin for pneumonia in October, and Augmentin in October for a UTI.  His chest x-rays here do not seem to pick up acute findings.    He has significant gastroesophageal reflux disease.  Previous upper GI series showed a hiatal hernia with mild esophageal dysmotility.  In October, he was switched to Dexilant from Protonix, head of the bed elevated, etc.  GI did not recommend an EGD.    He has been seen by Pulmonology for cough and recurrent pneumonitis.  A CT scan of the sinuses was  negative for acute findings.  He was put on regular prednisone 5 mg.    PAST MEDICAL HISTORY/PROBLEM LIST:      Catheter-associated UTIs.    Stage III perineal wound, which resolved.    Massive pulmonary embolism in February 2015.  On chronic anticoagulation with, I believe, still an IVC filter.    Recurrent pneumonia.    Hypertension.    Hip fracture earlier this year.    Hepatitis C, which the patient vehemently denies.    Paraplegia from a spinal cord injury in the 1980s.    Bipolar affective.    History of vitamin B12 disorder.    Seizure disorder.    PAST SURGICAL HISTORY:   Includes:    Left above-knee amputation.    Right hip fracture along with tibial plateau fracture in July 2014.    Appendectomy.    Cholecystectomy.    Spinal cord surgery.    CURRENT MEDICATIONS:  Medication list is reviewed.    Senna 8.6, 1 daily.    Vitamin B12, 1000 daily.    Vitamin D 50,000 U every 30 days.    Klonopin 1 mg p.o. q.12 h.    Nitrostat sublingual p.r.n. chest pain.    Ditropan 5 mg q.d. for bladder spasms.    OxyIR  15 mg, 1 p.o.  t.i.d. p.r.n.     Systane ophthalmic  t.i.d.    Micro-K 10 mEq daily.    Prednisone 10 mg daily.    Lyrica 75 b.i.d.    Seroquel 200 in the morning and 100 at bedtime.    Norvasc 5 q.d.    Lipitor 10 q.d.    Baclofen 20  t.i.d.    Calcium carbonate 1250, 1 with breakfast.    Klonopin 0.5, 1 mg  t.i.d.    Dexilant 60 b.i.d.    Cymbalta 60 q.h.s.    Pepcid 20 q.d.    Duragesic 75 q.3 days.    Ferrous sulfate 325 b.i.d.    Folic acid 1 mg a day.    Enulose 20 q.i.d.    Norco 5/325, 1 q.6 p.r.n. moderate pain and 2 q.6 p.r.n. severe pain.    DuoNebs four times a day.    Lamictal 200 b.i.d.    Coumadin 3.5 q.d.    REVIEW OF SYSTEMS:   CHEST/RESPIRATORY:  The patient is not complaining of shortness of breath.   CARDIAC:   No clear chest pain.   GI:  He continues to complain of episodic cough, acid  reflux, sour taste in his mouth, and nausea.   GU:  Suprapubic catheter.    PHYSICAL EXAMINATION:   VITAL SIGNS:   O2 SATURATIONS:  93% on 2 L.   RESPIRATIONS:  18 and unlabored.   PULSE:  100 and regular.   GENERAL APPEARANCE:  The patient is somewhat more lethargic than I am used to seeing.  Nevertheless, he is alert and responsive.   CHEST/RESPIRATORY:  He has bibasilar crackles.  No wheezing.  No accessory muscle use.   CARDIOVASCULAR:  CARDIAC:   Heart sounds are normal.  There are no murmurs.  JVP is not elevated.  There are no signs of congestive heart failure.   GASTROINTESTINAL:  ABDOMEN:   Distended.   LIVER/SPLEEN/KIDNEYS:  No liver, no spleen.  No tenderness.   CIRCULATION:  EDEMA/VARICOSITIES:  Right leg is without evidence of a DVT.      ASSESSMENT/PLAN:                          Treated for healthcare-acquired pneumonia in the hospital with seven days of antibiotics, complete.  This has become a recurrent issue and I have sometimes wondered whether he has a non-clearing infiltrate or at least a recurrent infiltrate in the left lower lobe.  These do not seem well shown on the portable films that we do in the facility.  However, the x-ray on this admission shows a left lower lobe infiltrate with probably an effusion.  This is almost unchanged from May of this year and also documented by the original CT scan of the chest that he had in February, at which time he was felt to have pulmonary infarction.  I would give this time to clear, perhaps as much as 4-5 weeks, and then send him for a repeat chest x-ray.  If this is not clear in the left lower lobe, then I think a repeat CT scan is indicated.    Probable severe gastroesophageal reflux.  I do not know what else can be done about this.  He has seen both Pulmonology and GI.  GI did not feel that he needed an endoscopy.  He has had an upper GI series.    COPD.    Massive pulmonary embolism in February  of this year, at which time he  had a filter placed and also anticoagulants.   I need to research; I do not believe the filter has been removed, but nor do I really understand why he would need one chronically.  I will research this.    Complicated urine cultures done in the hospital showing Acinetobacter and Enterococcus.  He was treated for both of these.  Whether he was really symptomatic or not, I am uncertain.  He has a chronic catheter.    The patient's only complaint today continues to be nausea, gastroesophageal reflux.  He asked for something for nausea.  I will order him Zofran.    My plan with regards to the left lower lobe is as noted above.  I would like to repeat the x-ray at [facility] in a month.  If there is still infiltrate in the left lower lobe, he will need a CT scan with contrast.

## 2014-07-17 ENCOUNTER — Other Ambulatory Visit: Payer: Self-pay | Admitting: *Deleted

## 2014-07-17 MED ORDER — LORAZEPAM 2 MG/ML IJ SOLN: INTRAMUSCULAR | Status: DC

## 2014-07-17 MED ORDER — LORAZEPAM 0.5 MG PO TABS: ORAL_TABLET | ORAL | Status: DC

## 2014-07-17 NOTE — Telephone Encounter (Signed)
[facility] 

## 2014-07-18 ENCOUNTER — Non-Acute Institutional Stay (SKILLED_NURSING_FACILITY): Payer: PRIVATE HEALTH INSURANCE | Admitting: Internal Medicine

## 2014-07-18 DIAGNOSIS — R410 Disorientation, unspecified: Secondary | ICD-10-CM

## 2014-07-18 DIAGNOSIS — N179 Acute kidney failure, unspecified: Secondary | ICD-10-CM

## 2014-07-18 DIAGNOSIS — J189 Pneumonia, unspecified organism: Secondary | ICD-10-CM

## 2014-07-24 NOTE — Progress Notes (Signed)
Patient ID: Samuel [name], male   DOB: 08-16-1955, 58 y.o.   MRN: [id number]               PROGRESS NOTE  DATE:  07/18/2014       FACILITY: [facility]     LEVEL OF CARE:   SNF   Acute Visit   CHIEF COMPLAINT:  Altered LOC, pyelonephritis.    HISTORY OF PRESENT ILLNESS:  Apparently, Samuel [name] deteriorated over the weekend.  He was noted to be much more confused.    He has had an extensive set of investigations including an essentially normal CBC on 07/17/2014.    His BUN was 32, creatinine 2.03.  This is quite a bit worse than on 06/12/2014 when his BUN was 10 and his creatinine was 0.7.  I gather that his chronic catheter has been changed.  His albumin is 2.9.    His urine culture has grown 75,000 multi-drug resistant Klebsiella pneumoniae (ESBL).  This was really sensitive to only imipenem and aminoglycosides.  He is currently on imipenem.    He was recently in the hospital for pneumonia.  At that point in time, I think a chest x-ray had shown a left lower lobe infiltrate.  He has had a previous left lower lobe pulmonary infarct in February of this year.    Urine culture showed multi-drug resistant Acinetobacter and Enterococcus.    Blood cultures were negative.    A CT scan of the chest done on 07/05/2014 showed new patchy and fairly extensive infiltrates in both upper lobes.  When I reviewed this earlier this month, I did not notice this.  He appears to also have multiple stones in the gallbladder, hepatic steatosis which is chronic.    We made an attempt to give him IV fluid here.  Apparently, they could not restart the IV.    CURRENT MEDICATIONS:  Medication list is reviewed.     Ditropan XL 5 mg daily.    Vitamin B9.    Folic acid 1 mg daily.    Vitamin B12 1000 daily.    KCl 10 q.d.     Prednisone 5 mg q.d.    Seroquel 200 mg in the morning.    Fentanyl patch 75 mg every 72 hours.    Lamictal 200 mg twice a day for bipolar disorder.    Ferrous  sulfate 325 twice a day.    Lyrica 75 q.d.    Dexilant 60 mg q.d.    Baclofen 20 mg three times a day.    Klonopin 0.5 three times a day.    DuoNebs four times a day.      Norvasc 5 q.d.    Lipitor 10 q.d.    Pepcid 20 q.d.    REVIEW OF SYSTEMS:        CHEST/RESPIRATORY:  The patient is not complaining of cough or shortness of breath.   CARDIAC:   No chest pain.   GI:  No abdominal pain or diarrhea.   GU:  Foley catheter in place, draining clear yellow urine.    PHYSICAL EXAMINATION:   VITAL SIGNS:   O2 SATURATIONS:  I had trouble getting his O2 sat.  When I did, it registered at 82%.   We could not get the facility's pulse ox to register at all.  He is not tachypneic, nor tachycardic.   TEMPERATURE:  He does not appear to be febrile.   GENERAL APPEARANCE:  The patient is awake; however, seems to fade  off in the middle of the exam.  He becomes somewhat less focused.  It is not hard, however, to refocus him.   CHEST/RESPIRATORY:  Completely clear air entry bilaterally.  No crackles or wheezes.   CARDIOVASCULAR:  CARDIAC:   Heart sounds are normal.  He appears to be euvolemic.   GASTROINTESTINAL:  ABDOMEN:   Surgical scars.  No masses.   LIVER/SPLEEN/KIDNEYS:  No liver, no spleen.  No tenderness.   NEUROLOGICAL:   Questionable asterixis in the right arm.   CIRCULATION:   EDEMA/VARICOSITIES:  Extremities:  His one remaining leg on the right has no evidence of a DVT.    ASSESSMENT/PLAN:                     Delirium.  Whether this is secondary to the Klebsiella that was cultured, I am uncertain.  He has ended up on a combination of Invanz, clindamycin, and Avelox.  I am not completely certain of the reason for clindamycin and Avelox, ?felt to have aspiration pneumonia.  He will need a chest x-ray.  He clearly also has dehydration and medications with multiple  side effects including Seroquel, Duragesic, Ditropan XL, Lamictal, Baclofen.    Acute renal failure.  This will need to  be followed up tomorrow.   Apparently, we could not restart the IVs.    A CT scan of the chest from 07/05/2014 showing extensive patchy infiltrates in both upper lobes and posteriorly in the right lung apex.  A differential was not provided.    Cholelithiasis.  I do not think there is any evidence that this is active.  He does not appear to have abdominal pain, tenderness, or fever.    Hepatic steatosis, which is listed as "chronic".  Based on this, I would wonder somewhat about underlying cirrhosis.    I am going to make medication adjustments.  We will see if we can follow up on the BUN and creatinine tomorrow.     CPT CODE: 01655

## 2014-07-25 ENCOUNTER — Non-Acute Institutional Stay (SKILLED_NURSING_FACILITY): Payer: PRIVATE HEALTH INSURANCE | Admitting: Internal Medicine

## 2014-07-25 DIAGNOSIS — R410 Disorientation, unspecified: Secondary | ICD-10-CM

## 2014-07-25 DIAGNOSIS — N10 Acute tubulo-interstitial nephritis: Secondary | ICD-10-CM

## 2014-07-26 NOTE — Progress Notes (Signed)
Patient ID: Samuel [name], male   DOB: February 16, 1956, 58 y.o.   MRN: [id number]               PROGRESS NOTE  DATE:  07/25/2014       FACILITY: [facility]    LEVEL OF CARE:   SNF   Acute Visit   CHIEF COMPLAINT:  Follow up delirium.    HISTORY OF PRESENT ILLNESS:  I saw this patient a week ago.  He had altered LOC that began the weekend before this.  He had a urine culture that grew 75,000 multidrug resistant Klebsiella.  He was put on imipenem.    He was recently in the hospital earlier this month for pneumonia, at which time a chest x-ray showed a left lower lobe infiltrate.  A CT scan of the chest done on 07/05/2014 showed fairly extensive infiltrates in both upper lobes.  It also showed cholelithiasis and hepatic steatosis.    We put several of his medications on hold, as well as reducing his Fentanyl patch.  He has completed his antibiotics.  He appears to be doing considerably better.  He was hypoxic last week.  However, this seems a lot better, as well.    REVIEW OF SYSTEMS:     CHEST/RESPIRATORY:  The patient is not complaining of cough or shortness of breath.   CARDIAC:   No chest pain.   GI:  No abdominal pain or diarrhea.   GU:  Foley catheter in place.    PHYSICAL EXAMINATION:       VITAL SIGNS:   O2 SATURATIONS:  His O2 sat is 93%, and he has been off his oxygen pending sanitation of his tubing.   RESPIRATIONS:  18 and unlabored.   PULSE:  82 and regular.   CHEST/RESPIRATORY:  Clear air entry bilaterally.   CARDIOVASCULAR:  CARDIAC:   Heart sounds are normal.  He appears to be euvolemic.   GASTROINTESTINAL:  ABDOMEN:   Soft.  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   PSYCHIATRIC:   MENTAL STATUS:   He is back to his normal functional self, asking to go to [place] on the [employer] bus.    ASSESSMENT/PLAN:                            Delirium.  This appears to be a lot better.  I am not exactly sure of the cause here.  He was treated for Klebsiella pneumoniae UTI in a chronic  Foley catheter situation.  This appears to be stable.  He had some degree of worsening renal insufficiency with a BUN of 32 and a creatinine of 2.03, versus 10 and 0.7 on 06/02/2014.  I will see if this was followed.  We cannot maintain an intravenous in the facility.    Asymptomatic cholelithiasis.  Documented on a CT scan.  There has really been no evidence that this has caused an active problem.    CPT CODE: 69485

## 2014-08-08 ENCOUNTER — Non-Acute Institutional Stay (SKILLED_NURSING_FACILITY): Payer: Medicare Other | Admitting: Internal Medicine

## 2014-08-08 DIAGNOSIS — R059 Cough, unspecified: Secondary | ICD-10-CM

## 2014-08-08 DIAGNOSIS — J9601 Acute respiratory failure with hypoxia: Secondary | ICD-10-CM

## 2014-08-08 DIAGNOSIS — R05 Cough: Secondary | ICD-10-CM

## 2014-08-10 NOTE — Progress Notes (Addendum)
Patient ID: Samuel [name], male   DOB: 16-Oct-1955, 59 y.o.   MRN: [id number]               PROGRESS NOTE  DATE:  08/08/2014                     FACILITY: [facility]               LEVEL OF CARE:   SNF   Routine Visit   CHIEF COMPLAINT:  Follow up medical issues/[facility] visit.    HISTORY OF PRESENT ILLNESS:   Towards the end of December, the patient was noted to be delirious.  He was found to have pneumonia, possibly a Klebsiella urine infection in a chronic Foley catheter situation, also some elements of drug-induced delirium.  He seemed to improve with treatment of all of the above.    A CT scan of the chest done in hospital on 07/05/2014 for healthcare-acquired pneumonia showed fairly extensive infiltrates in both upper lobes.  It also showed cholelithiasis and hepatic steatosis.    Lab work on 07/17/2014 showed a BUN of 32 and a creatinine of 2.03.  His liver function tests were previously normal.  Ammonia level was 33.    The patient seemed to recover from all of the above.  His major complaint now continues to be severe acid reflux with acid taste in his mouth, heartburn, nocturnal coughing.  He was previously seen by GI.  They did not feel he needed an endoscopy.    CURRENT MEDICATIONS:  Medication list is reviewed.     Ditropan XL 5 mg a day.    Folic acid 1 mg a day.    Vitamin B12, 1000 mg daily.    Potassium 10 mEq daily.     Seroquel 200 mg in the morning and at night.  This is a recent increase.     Os-Cal 500 plus D.    Lamictal 200 mg b.i.d.    Ferrous sulfate 325 b.i.d.    Dexilant 60 mg b.i.d.    Lyrica 75 b.i.d.    Baclofen three times daily.    Clonazepam 0.5 three times a day.    DuoNebs 1 nebulizer four times a day.    Enulose 30 mL four times a day.    Norvasc 5 q.d.    Lipitor 10 q.d.    Pepcid 20 q.d.   Cymbalta 60 q.d.    Vitamin D3, 50,000 U monthly.    REVIEW OF SYSTEMS:   CHEST/RESPIRATORY:  Again, the patient describes  cough.  Relates this seemingly quite clearly to reflux type symptoms.    CARDIAC:   He has not had any chest pain.   GI:  The only thing he describes is reflux without clear vomiting or abdominal pain.     PHYSICAL EXAMINATION:   VITAL SIGNS:   O2 SATURATIONS:  88-90% on room air.    RESPIRATIONS:  22.   PULSE:   110.    GENERAL APPEARANCE:  The patient is much more alert, conversational.   CHEST/RESPIRATORY:  Clear air entry.  However, prolonged expiratory phase.  Mild end expiratory wheezing.   CARDIOVASCULAR:  CARDIAC:   Heart sounds are normal.  There are no murmurs.   GASTROINTESTINAL:  ABDOMEN:   Somewhat distended.  Nontender.  Multiple surgical scars.    GENITOURINARY:  BLADDER:    As mentioned above, chronic Foley catheter.  CIRCULATION:  EDEMA/VARICOSITIES:  Extremities:  No evidence of a DVT  in his remaining leg.     ASSESSMENT/PLAN:                             Delirium from late last month.  Multifactorial.  This seems to have resolved.    Gastroesophageal reflux.  At least clinically, this seems severe and probably related to his coughing.    Cough.  See discussion above.    Hypoxemia.  This is not the first time I have come across this.  I looked at a portable chest x-ray done during his hospitalization in early December.  This is a poor inspiration film, but shows a left lower lobe infiltrate.  CT scan of the chest subsequently showed bilateral upper lobe infiltrates which were new.  We have previously had him seen by  Pulmonary for recurrent LLL infiltrate among other issues. I think at some point his LLL infiltrate was felt to be seccondary to pulmonary infarction   CPT CODE: 96283

## 2014-08-15 ENCOUNTER — Non-Acute Institutional Stay (SKILLED_NURSING_FACILITY): Payer: Medicare Other | Admitting: Internal Medicine

## 2014-08-15 DIAGNOSIS — R0902 Hypoxemia: Secondary | ICD-10-CM

## 2014-08-15 DIAGNOSIS — N179 Acute kidney failure, unspecified: Secondary | ICD-10-CM

## 2014-08-17 NOTE — Progress Notes (Signed)
Patient ID: Samuel [name], male   DOB: 11-01-1955, 59 y.o.   MRN: [id number]               PROGRESS NOTE  DATE:  08/15/2014                           FACILITY: [facility]        LEVEL OF CARE:   SNF   Acute Visit   CHIEF COMPLAINT:  Follow up recurrent left lower lobe infiltrate, hypoxemia.      HISTORY OF PRESENT ILLNESS:  Samuel [name] is a gentleman whom I have some concerns over.    He was hospitalized in December.  A CT scan of the chest done in the hospital showed fairly extensive infiltrates in both upper lobes, also a patchy infiltrate in the left lower lobe which I am beginning to think is chronic (?pulmonary infarction).    Most of the time when I see him, he is running O2 sats between 89% and 92% on room air.    Over this weekend, he apparently spiked a fever up to 101.  A chest x-ray showed left lower lung infiltrate.  He was put on Rocephin.  He is now afebrile with no complaints.  I do not really think the Rocephin is necessary.    REVIEW OF SYSTEMS:   CHEST/RESPIRATORY:  He still complains of coughing, which he thinks is secondary to reflux and worse at night.     CARDIAC:   No clear chest pain.     GI:  Acid reflux into his mouth, dyspepsia.    PHYSICAL EXAMINATION:   VITAL SIGNS:   RESPIRATIONS:  18.   PULSE:  82.    O2 SATURATIONS:  89% on room air.   GENERAL APPEARANCE:  The patient is not in any distress.   He is alert, conversational.   CHEST/RESPIRATORY:  Lungs sound completely clear, even the left lower lobe.   CARDIOVASCULAR:  CARDIAC:   Heart sounds are normal.  There are no signs of CHF.   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    ASSESSMENT/PLAN:                                            Hypoxemia.  I am not completely certain why this is occurring.  I am going to send him for a blood gas on room  air.  Further imaging studies depend on this.    Persistent left lower lobe infiltrate.  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  I would wonder about recurrent aspiration, as well.     Renal insufficiency.  There have been fluctuations in this man's creatinine.  On 07/17/2014 when he returned from the hospital, his BUN was 32 and creatinine 2.03.  This was repeated on 07/19/2014, at which time his BUN was 8 and creatinine 1.06.  Follow-up on 08/11/2014 shows his creatinine to be 1.9 and BUN of 13.   The cause of this is not clear.  He has received antibiotics.  He may have interstitial nephritis.  I will  do a renal ultrasound on him.     CPT CODE: 21747

## 2014-08-25 ENCOUNTER — Emergency Department (HOSPITAL_COMMUNITY)
Admission: EM | Admit: 2014-08-25 | Discharge: 2014-08-25 | Disposition: A | Discharge status: Home / Self Care | Payer: Medicare Other | Attending: Emergency Medicine | Admitting: Emergency Medicine

## 2014-08-25 ENCOUNTER — Encounter (HOSPITAL_COMMUNITY): Payer: Self-pay | Admitting: Emergency Medicine

## 2014-08-25 DIAGNOSIS — D649 Anemia, unspecified: Secondary | ICD-10-CM | POA: Diagnosis not present

## 2014-08-25 DIAGNOSIS — Z7901 Long term (current) use of anticoagulants: Secondary | ICD-10-CM | POA: Diagnosis not present

## 2014-08-25 DIAGNOSIS — Z8619 Personal history of other infectious and parasitic diseases: Secondary | ICD-10-CM | POA: Insufficient documentation

## 2014-08-25 DIAGNOSIS — Z87828 Personal history of other (healed) physical injury and trauma: Secondary | ICD-10-CM | POA: Diagnosis not present

## 2014-08-25 DIAGNOSIS — Z8701 Personal history of pneumonia (recurrent): Secondary | ICD-10-CM | POA: Insufficient documentation

## 2014-08-25 DIAGNOSIS — G8929 Other chronic pain: Secondary | ICD-10-CM | POA: Insufficient documentation

## 2014-08-25 DIAGNOSIS — I10 Essential (primary) hypertension: Secondary | ICD-10-CM | POA: Diagnosis not present

## 2014-08-25 DIAGNOSIS — G40909 Epilepsy, unspecified, not intractable, without status epilepticus: Secondary | ICD-10-CM | POA: Diagnosis not present

## 2014-08-25 DIAGNOSIS — Z7952 Long term (current) use of systemic steroids: Secondary | ICD-10-CM | POA: Diagnosis not present

## 2014-08-25 DIAGNOSIS — K219 Gastro-esophageal reflux disease without esophagitis: Secondary | ICD-10-CM | POA: Diagnosis not present

## 2014-08-25 DIAGNOSIS — G4700 Insomnia, unspecified: Secondary | ICD-10-CM | POA: Insufficient documentation

## 2014-08-25 DIAGNOSIS — R319 Hematuria, unspecified: Secondary | ICD-10-CM

## 2014-08-25 DIAGNOSIS — N3091 Cystitis, unspecified with hematuria: Secondary | ICD-10-CM | POA: Diagnosis not present

## 2014-08-25 DIAGNOSIS — N3090 Cystitis, unspecified without hematuria: Secondary | ICD-10-CM

## 2014-08-25 DIAGNOSIS — E669 Obesity, unspecified: Secondary | ICD-10-CM | POA: Diagnosis not present

## 2014-08-25 DIAGNOSIS — F319 Bipolar disorder, unspecified: Secondary | ICD-10-CM | POA: Insufficient documentation

## 2014-08-25 DIAGNOSIS — Z79899 Other long term (current) drug therapy: Secondary | ICD-10-CM | POA: Insufficient documentation

## 2014-08-25 DIAGNOSIS — K5900 Constipation, unspecified: Secondary | ICD-10-CM | POA: Insufficient documentation

## 2014-08-25 DIAGNOSIS — Z87891 Personal history of nicotine dependence: Secondary | ICD-10-CM | POA: Diagnosis not present

## 2014-08-25 DIAGNOSIS — E538 Deficiency of other specified B group vitamins: Secondary | ICD-10-CM | POA: Diagnosis not present

## 2014-08-25 DIAGNOSIS — E785 Hyperlipidemia, unspecified: Secondary | ICD-10-CM | POA: Insufficient documentation

## 2014-08-25 LAB — I-STAT CHEM 8, ED
BUN: 18 mg/dL (ref 6–23)
CALCIUM ION: 1.12 mmol/L (ref 1.12–1.23)
Chloride: 104 mmol/L (ref 96–112)
Creatinine, Ser: 1 mg/dL (ref 0.50–1.35)
Glucose, Bld: 106 mg/dL — ABNORMAL HIGH (ref 70–99)
HEMATOCRIT: 40 % (ref 39.0–52.0)
HEMOGLOBIN: 13.6 g/dL (ref 13.0–17.0)
POTASSIUM: 3.7 mmol/L (ref 3.5–5.1)
SODIUM: 141 mmol/L (ref 135–145)
TCO2: 22 mmol/L (ref 0–100)

## 2014-08-25 LAB — URINALYSIS, ROUTINE W REFLEX MICROSCOPIC
Glucose, UA: NEGATIVE mg/dL
Ketones, ur: 15 mg/dL — AB
NITRITE: POSITIVE — AB
PH: 7 (ref 5.0–8.0)
Protein, ur: 100 mg/dL — AB
Specific Gravity, Urine: 1.01 (ref 1.005–1.030)
Urobilinogen, UA: 1 mg/dL (ref 0.0–1.0)

## 2014-08-25 LAB — URINE MICROSCOPIC-ADD ON

## 2014-08-25 LAB — CBC WITH DIFFERENTIAL/PLATELET
BASOS PCT: 0 % (ref 0–1)
Basophils Absolute: 0 10*3/uL (ref 0.0–0.1)
EOS PCT: 0 % (ref 0–5)
Eosinophils Absolute: 0 10*3/uL (ref 0.0–0.7)
HEMATOCRIT: 37.9 % — AB (ref 39.0–52.0)
HEMOGLOBIN: 12.1 g/dL — AB (ref 13.0–17.0)
LYMPHS ABS: 0.8 10*3/uL (ref 0.7–4.0)
LYMPHS PCT: 8 % — AB (ref 12–46)
MCH: 26.5 pg (ref 26.0–34.0)
MCHC: 31.9 g/dL (ref 30.0–36.0)
MCV: 83.1 fL (ref 78.0–100.0)
Monocytes Absolute: 0.6 10*3/uL (ref 0.1–1.0)
Monocytes Relative: 6 % (ref 3–12)
Neutro Abs: 9 10*3/uL — ABNORMAL HIGH (ref 1.7–7.7)
Neutrophils Relative %: 86 % — ABNORMAL HIGH (ref 43–77)
Platelets: 327 10*3/uL (ref 150–400)
RBC: 4.56 MIL/uL (ref 4.22–5.81)
RDW: 14.6 % (ref 11.5–15.5)
WBC: 10.4 10*3/uL (ref 4.0–10.5)

## 2014-08-25 LAB — PROTIME-INR
INR: 2.51 — ABNORMAL HIGH (ref 0.00–1.49)
PROTHROMBIN TIME: 27.3 s — AB (ref 11.6–15.2)

## 2014-08-25 MED ORDER — CEPHALEXIN 500 MG PO CAPS
500.0000 mg | ORAL_CAPSULE | Freq: Once | ORAL | Status: AC
  Administered 2014-08-25: 500 mg via ORAL
  Filled 2014-08-25: qty 1

## 2014-08-25 MED ORDER — OXYCODONE-ACETAMINOPHEN 5-325 MG PO TABS
1.0000 | ORAL_TABLET | Freq: Once | ORAL | Status: AC
  Administered 2014-08-25: 1 via ORAL
  Filled 2014-08-25: qty 1

## 2014-08-25 MED ORDER — CEPHALEXIN 250 MG PO CAPS: 500.0000 mg | ORAL_CAPSULE | Freq: Two times a day (BID) | ORAL | Status: DC

## 2014-08-25 NOTE — ED Notes (Signed)
[facility] called to make sure pt still on list for transport. Dispatch stated that he is up next

## 2014-08-25 NOTE — ED Notes (Signed)
Pt from [facility] home. Reports hematuria in urine bag that began when catheter replaced on tuesday. Got worse today. Pt normally on blood thinner, but did not take dose today. Has appointment with urology on Monday

## 2014-08-25 NOTE — ED Notes (Signed)
[facility] called and made aware of need to transport patient back to nursing home

## 2014-08-25 NOTE — ED Notes (Signed)
Bed: [facility] Expected date:  Expected time:  Means of arrival:  Comments: 

## 2014-08-25 NOTE — Discharge Instructions (Signed)
Take antibiotics until all gone. Keep apt with urology on Monday. Follow up with them. Return if urine not flowing or having high fever, vomiting, any new concerning symtpom.    Hematuria Hematuria is blood in your urine. It can be caused by a bladder infection, kidney infection, prostate infection, kidney stone, or cancer of your urinary tract. Infections can usually be treated with medicine, and a kidney stone usually will pass through your urine. If neither of these is the cause of your hematuria, further workup to find out the reason may be needed. It is very important that you tell your health care provider about any blood you see in your urine, even if the blood stops without treatment or happens without causing pain. Blood in your urine that happens and then stops and then happens again can be a symptom of a very serious condition. Also, pain is not a symptom in the initial stages of many urinary cancers. HOME CARE INSTRUCTIONS   Drink lots of fluid, 3-4 quarts a day. If you have been diagnosed with an infection, cranberry juice is especially recommended, in addition to large amounts of water.  Avoid caffeine, tea, and carbonated beverages because they tend to irritate the bladder.  Avoid alcohol because it may irritate the prostate.  Take all medicines as directed by your health care provider.  If you were prescribed an antibiotic medicine, finish it all even if you start to feel better.  If you have been diagnosed with a kidney stone, follow your health care provider's instructions regarding straining your urine to catch the stone.  Empty your bladder often. Avoid holding urine for long periods of time.  After a bowel movement, women should cleanse front to back. Use each tissue only once.  Empty your bladder before and after sexual intercourse if you are a male. SEEK MEDICAL CARE IF:  You develop back pain.  You have a fever.  You have a feeling of sickness in your stomach  (nausea) or vomiting.  Your symptoms are not better in 3 days. Return sooner if you are getting worse. SEEK IMMEDIATE MEDICAL CARE IF:   You develop severe vomiting and are unable to keep the medicine down.  You develop severe back or abdominal pain despite taking your medicines.  You begin passing a large amount of blood or clots in your urine.  You feel extremely weak or faint, or you pass out. MAKE SURE YOU:   Understand these instructions.  Will watch your condition.  Will get help right away if you are not doing well or get worse. Document Released: 07/14/2005 Document Revised: 11/28/2013 Document Reviewed: 03/14/2013 [facility] Patient Information 2015 [place], [place]. This information is not intended to replace advice given to you by your health care provider. Make sure you discuss any questions you have with your health care provider. Urinary Tract Infection Urinary tract infections (UTIs) can develop anywhere along your urinary tract. Your urinary tract is your body's drainage system for removing wastes and extra water. Your urinary tract includes two kidneys, two ureters, a bladder, and a urethra. Your kidneys are a pair of bean-shaped organs. Each kidney is about the size of your fist. They are located below your ribs, one on each side of your spine. CAUSES Infections are caused by microbes, which are microscopic organisms, including fungi, viruses, and bacteria. These organisms are so small that they can only be seen through a microscope. Bacteria are the microbes that most commonly cause UTIs. SYMPTOMS  Symptoms of  UTIs may vary by age and gender of the patient and by the location of the infection. Symptoms in young women typically include a frequent and intense urge to urinate and a painful, burning feeling in the bladder or urethra during urination. Older women and men are more likely to be tired, shaky, and weak and have muscle aches and abdominal pain. A fever may mean the  infection is in your kidneys. Other symptoms of a kidney infection include pain in your back or sides below the ribs, nausea, and vomiting. DIAGNOSIS To diagnose a UTI, your caregiver will ask you about your symptoms. Your caregiver also will ask to provide a urine sample. The urine sample will be tested for bacteria and white blood cells. White blood cells are made by your body to help fight infection. TREATMENT  Typically, UTIs can be treated with medication. Because most UTIs are caused by a bacterial infection, they usually can be treated with the use of antibiotics. The choice of antibiotic and length of treatment depend on your symptoms and the type of bacteria causing your infection. HOME CARE INSTRUCTIONS  If you were prescribed antibiotics, take them exactly as your caregiver instructs you. Finish the medication even if you feel better after you have only taken some of the medication.  Drink enough water and fluids to keep your urine clear or pale yellow.  Avoid caffeine, tea, and carbonated beverages. They tend to irritate your bladder.  Empty your bladder often. Avoid holding urine for long periods of time.  Empty your bladder before and after sexual intercourse.  After a bowel movement, women should cleanse from front to back. Use each tissue only once. SEEK MEDICAL CARE IF:   You have back pain.  You develop a fever.  Your symptoms do not begin to resolve within 3 days. SEEK IMMEDIATE MEDICAL CARE IF:   You have severe back pain or lower abdominal pain.  You develop chills.  You have nausea or vomiting.  You have continued burning or discomfort with urination. MAKE SURE YOU:   Understand these instructions.  Will watch your condition.  Will get help right away if you are not doing well or get worse. Document Released: 04/23/2005 Document Revised: 01/13/2012 Document Reviewed: 08/22/2011 [facility] Patient Information 2015 [place], [place]. This information is not  intended to replace advice given to you by your health care provider. Make sure you discuss any questions you have with your health care provider.
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
## 2014-08-29 ENCOUNTER — Non-Acute Institutional Stay (SKILLED_NURSING_FACILITY): Payer: Medicare Other | Admitting: Internal Medicine

## 2014-08-29 DIAGNOSIS — R319 Hematuria, unspecified: Secondary | ICD-10-CM

## 2014-08-29 DIAGNOSIS — N390 Urinary tract infection, site not specified: Secondary | ICD-10-CM

## 2014-08-29 LAB — URINE CULTURE

## 2014-08-30 ENCOUNTER — Telehealth (HOSPITAL_BASED_OUTPATIENT_CLINIC_OR_DEPARTMENT_OTHER): Payer: Self-pay | Admitting: Emergency Medicine

## 2014-08-30 NOTE — Telephone Encounter (Signed)
Post ED Visit - Positive Culture Follow-up: Successful Patient Follow-Up  Culture assessed and recommendations reviewed by: []  Wes [place], Pharm.D., BCPS []  Heide Guile, Pharm.D., BCPS [x]  Alycia Rossetti, Pharm.D., BCPS []  [place], Pharm.D., BCPS, AAHIVP []  Legrand Como, Pharm.D., BCPS, AAHIVP []  Hassie Bruce, Pharm.D. []  Milus Glazier, Pharm.D.  Positive urine culture  []  Likely colonizer, No additional treatment for UTI, D/C Keflex   Changes discussed with ED provider: Lianne Bushy, PA  Contacted patient, date 08/30/2014, time 1740 Patient resident @ [facility], per Aafo, RN, Keflex was d/c yesterday 2/216.   Ernesta Amble 08/30/2014, 5:41 PM

## 2014-08-30 NOTE — Progress Notes (Signed)
ED Antimicrobial Stewardship Positive Culture Follow Up   Samuel [name] is an 59 y.o. male who presented to [facility] on 08/25/2014 with a chief complaint of hematuria  Chief Complaint  Patient presents with  . Hematuria    Recent Results (from the past 720 hour(s))  Urine culture     Status: None   Collection Time: 08/25/14  8:46 PM  Result Value Ref Range Status   Specimen Description URINE, CLEAN CATCH  Final   Special Requests NONE  Final   Colony Count   Final    >=100,000 COLONIES/ML Performed at [facility]    Culture   Final    KLEBSIELLA PNEUMONIAE Note: Confirmed Extended Spectrum Beta-Lactamase Producer (ESBL) Performed at [facility]    Report Status 08/29/2014 FINAL  Final   Organism ID, Bacteria KLEBSIELLA PNEUMONIAE  Final      Susceptibility   Klebsiella pneumoniae - MIC*    AMPICILLIN >=32 RESISTANT Resistant     CEFAZOLIN >=64 RESISTANT Resistant     CEFTRIAXONE >=64 RESISTANT Resistant     CIPROFLOXACIN >=4 RESISTANT Resistant     GENTAMICIN >=16 RESISTANT Resistant     LEVOFLOXACIN >=8 RESISTANT Resistant     NITROFURANTOIN 256 RESISTANT Resistant     TOBRAMYCIN 8 INTERMEDIATE Intermediate     TRIMETH/SULFA >=320 RESISTANT Resistant     IMIPENEM <=0.25 SENSITIVE Sensitive     PIP/TAZO >=128 RESISTANT Resistant     * KLEBSIELLA PNEUMONIAE    [x]  No treatment needed  60 YOM with hx paraplegia and chronic indwelling foley in place for 2 years who presented wit hematuria that had been persistent since foley was changed. UA showed a few bactereia, WBC 7-10, UCx grew out ESBL K PNA - since pt Afebrile, WBC WNL - this is likely a colonizer and not a pathogen. Would not recommend treating.   The patient was noted to have a follow-up visit with his urologist 2 days after the ED visit  New antibiotic prescription: Discontinue Keflex - no additional treatment needed at this time.   ED Provider:  Jeannett Senior, PA-C   Samuel [name] 08/30/2014, 10:52 AM Infectious Diseases Pharmacist Phone# [phone]
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
## 2014-09-05 ENCOUNTER — Non-Acute Institutional Stay (SKILLED_NURSING_FACILITY): Payer: Medicare Other | Admitting: Internal Medicine

## 2014-09-05 DIAGNOSIS — Z7901 Long term (current) use of anticoagulants: Secondary | ICD-10-CM

## 2014-09-05 DIAGNOSIS — I2782 Chronic pulmonary embolism: Secondary | ICD-10-CM

## 2014-09-06 ENCOUNTER — Other Ambulatory Visit: Payer: Self-pay | Admitting: Urology

## 2014-09-08 NOTE — Progress Notes (Addendum)
Patient ID: Samuel [name], male   DOB: [birth year], 59 y.o.   MRN: [id number]               PROGRESS NOTE  DATE:  09/05/2014                   FACILITY: [facility]       LEVEL OF CARE:   SNF   Acute Visit   CHIEF COMPLAINT:  Pre-op review.    HISTORY OF PRESENT ILLNESS:   Samuel [name] is a 59 year-old man who recently had problems with hematuria.  He went to the ER.  A urine culture there showed multi-drug resistant Klebsiella and I have gone ahead and given him a course of Invanz, which was the only antibiotic that this was recently sensitive to.  He has since had a Urology work-up.  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  He still has a Foley catheter in place.   The patient states he has had a CT scan (?staging CT scan).  If that is true, I cannot see the result of this in the [facility] system.  He is not exactly sure where he had this done.    Reviewing his history, the only relevant issue here is the fact that roughly a year ago he had a severe pulmonary embolism after surgery for a left hip fracture.  He had an IVC filter as well as anticoagulation for a period of time.   The IVC filter has since been removed and he has remained on Coumadin.  An echocardiogram done on 09/20/2013 showed an EF of 55-60%.  Left ventricular diastolic function was normal.   Pulmonary artery pressure was normal.  Right atrium was normal in size.  Right ventricular systolic function was normal.  He does not have a history of coronary artery disease.    CURRENT MEDICATIONS:  Medication list is reviewed.      Ditropan XL 5 mg daily.    Folic acid 1 mg daily.    Vitamin B12, 1000 daily.    KCl 10 mEq daily.    Prednisone 10 mg daily.    Os-Cal 500 by mouth daily.    Fentanyl 50 mcg every 72 hours for chronic pain.    Seroquel 200 mg twice daily for bipolar disorder.    Lamictal 200 mg twice daily for bipolar disorder.    Ferrous  sulfate 325 daily.    Senokot S twice daily.    Dexilant 60 mg twice daily.     Lyrica 75 q.d.      Baclofen 20 mg three times a day.    Systane ophthalmic.    Klonopin 0.5 three times a day.    DuoNebs four times a day.    Norvasc 5 q.d.      Lipitor 10 q.d.      Pepcid 20 q.d.      Cymbalta 60 q.d.     Vitamin D3, 50,000 U once a month.   Synthroid 12.5 q.d.   Coumadin 4.5 q.d.      LABORATORY DATA:  Recent lab work:    Hemoglobin A1c 5.7.    Serum iron 22, TIBC 228.    Hemoglobin 12.1.  Platelet count normal.    TSH 5.443.    Vitamin B12 level 1288.    Folic acid greater than 20.    REVIEW OF SYSTEMS:  CHEST/RESPIRATORY:  He is not short of breath and denies cough.   CARDIAC:   No chest pain.   GI:  No abdominal pain.   GU:  See HPI.    PHYSICAL EXAMINATION:             VITAL SIGNS:   O2 SATURATIONS:  93% on room air.   RESPIRATIONS:  20.   PULSE:  78 and regular.    GENERAL APPEARANCE:  The patient is not in any distress.    CHEST/RESPIRATORY:  Clear air entry bilaterally.   CARDIOVASCULAR:  CARDIAC:   Heart sounds are normal.  There are no murmurs.  He appears to be euvolemic.   VASCULAR:   ARTERIAL:  Right lower extremity:   No evidence of a DVT.    ASSESSMENT/PLAN:      In terms of surgical risk, I think this is minimal for a reasonably safe procedure.  I think his Coumadin can be held three days before his procedure and restarted at the direction of his urologist.  I do not feel strongly about bridging him on Lovenox at this point.  His pulmonary embolism was over a year ago at the time of orthopedic issues.    ?COPD.  I do not know that this has ever been formally diagnosed.  He might benefit from PFTs in the future.  I do not think this should be an issue, either.

## 2014-09-12 ENCOUNTER — Non-Acute Institutional Stay (SKILLED_NURSING_FACILITY): Payer: Medicare Other | Admitting: Internal Medicine

## 2014-09-12 DIAGNOSIS — R05 Cough: Secondary | ICD-10-CM | POA: Diagnosis not present

## 2014-09-12 DIAGNOSIS — R059 Cough, unspecified: Secondary | ICD-10-CM

## 2014-09-12 DIAGNOSIS — K219 Gastro-esophageal reflux disease without esophagitis: Secondary | ICD-10-CM | POA: Diagnosis not present

## 2014-09-13 ENCOUNTER — Encounter (HOSPITAL_COMMUNITY): Payer: Self-pay | Admitting: *Deleted

## 2014-09-13 NOTE — Progress Notes (Addendum)
                 Your procedure is scheduled on: Tuesday, September 26, 2014  Report to [facility]  Call this number if you have problems the morning of surgery: [phone]  Spoke with April Crayton, Lpn at [facility] [phone] fax number [phone] by telephone pre op instructions reviewed.   Will fax the instructions when the most recent medication record is received.  Will call to confirm that the information was received and is clear.    Remember: Bring insurance card and picture ID, PLEASE SEND CURRENT MEDICATION LIST AND TIME LAST MEDICATIONS TAKEN  WITH PATIENT MORNING OF SURGERY.   Do not drink liquids or  eat food:After Midnight.Monday night, September 25, 2014     Take these medicines the morning of surgery with A SIP OF WATER:Prednisone, Lyrica, Seroquel, Lamictal, Baclofen and Klonopin   Use Sysane eye drops two drops in each eye, Use Duo nebulizer   Duragesic patch will be applied   Follow doctor's for stopping the Coumadin                               SEE [facility] PREPARING FOR SURGERY SHEET    Do not wear jewelry, make-up or nail polish.  Do not wear lotions, powders, or perfumes. You may wear deodorant.             Men may shave face and neck.  Do not bring valuables to the hospital.  Contacts, dentures or bridgework may not be worn into surgery.  Leave suitcase in the car. After surgery it may be brought to your room.  For patients admitted to the hospital, checkout time is 11:00 AM the day of discharge.   Patients discharged the day of surgery will not be allowed to drive home.  Name and phone number of your driver: Facility uses a third party will provide the name day of surgery                      Call Guadelupe Sabin, RN pre op nurse if needed [phone] Main Number [phone]               [place].     PATIENT    SIGNATURE___________________________________________________

## 2014-09-15 ENCOUNTER — Other Ambulatory Visit: Payer: Self-pay

## 2014-09-15 MED ORDER — OXYCODONE HCL 15 MG PO TABS: ORAL_TABLET | ORAL | Status: DC

## 2014-09-15 MED ORDER — FENTANYL 50 MCG/HR TD PT72: 50.0000 ug | MEDICATED_PATCH | TRANSDERMAL | Status: DC

## 2014-09-15 NOTE — Telephone Encounter (Signed)
Rx faxed to [facility] @ [phone], phone number [phone]  

## 2014-09-17 ENCOUNTER — Inpatient Hospital Stay (HOSPITAL_COMMUNITY)
Admission: EM | Admit: 2014-09-17 | Discharge: 2014-10-01 | DRG: 987 | Disposition: A | Discharge status: Skilled Nursing Facility | Payer: Medicare Other | Attending: Internal Medicine | Admitting: Internal Medicine

## 2014-09-17 ENCOUNTER — Encounter (HOSPITAL_COMMUNITY): Payer: Self-pay | Admitting: Vascular Surgery

## 2014-09-17 ENCOUNTER — Emergency Department (HOSPITAL_COMMUNITY): Payer: Medicare Other

## 2014-09-17 DIAGNOSIS — Z87891 Personal history of nicotine dependence: Secondary | ICD-10-CM

## 2014-09-17 DIAGNOSIS — R509 Fever, unspecified: Secondary | ICD-10-CM

## 2014-09-17 DIAGNOSIS — E870 Hyperosmolality and hypernatremia: Secondary | ICD-10-CM | POA: Diagnosis present

## 2014-09-17 DIAGNOSIS — N251 Nephrogenic diabetes insipidus: Secondary | ICD-10-CM | POA: Diagnosis present

## 2014-09-17 DIAGNOSIS — Z7952 Long term (current) use of systemic steroids: Secondary | ICD-10-CM | POA: Diagnosis not present

## 2014-09-17 DIAGNOSIS — Z79891 Long term (current) use of opiate analgesic: Secondary | ICD-10-CM | POA: Diagnosis not present

## 2014-09-17 DIAGNOSIS — Z7901 Long term (current) use of anticoagulants: Secondary | ICD-10-CM

## 2014-09-17 DIAGNOSIS — Y95 Nosocomial condition: Secondary | ICD-10-CM | POA: Diagnosis present

## 2014-09-17 DIAGNOSIS — N179 Acute kidney failure, unspecified: Secondary | ICD-10-CM

## 2014-09-17 DIAGNOSIS — R600 Localized edema: Secondary | ICD-10-CM | POA: Diagnosis present

## 2014-09-17 DIAGNOSIS — Z79899 Other long term (current) drug therapy: Secondary | ICD-10-CM

## 2014-09-17 DIAGNOSIS — T8351XA Infection and inflammatory reaction due to indwelling urinary catheter, initial encounter: Secondary | ICD-10-CM | POA: Diagnosis present

## 2014-09-17 DIAGNOSIS — N1330 Unspecified hydronephrosis: Secondary | ICD-10-CM

## 2014-09-17 DIAGNOSIS — N319 Neuromuscular dysfunction of bladder, unspecified: Secondary | ICD-10-CM | POA: Diagnosis present

## 2014-09-17 DIAGNOSIS — Q549 Hypospadias, unspecified: Secondary | ICD-10-CM

## 2014-09-17 DIAGNOSIS — Z8781 Personal history of (healed) traumatic fracture: Secondary | ICD-10-CM | POA: Diagnosis not present

## 2014-09-17 DIAGNOSIS — K219 Gastro-esophageal reflux disease without esophagitis: Secondary | ICD-10-CM | POA: Diagnosis present

## 2014-09-17 DIAGNOSIS — I10 Essential (primary) hypertension: Secondary | ICD-10-CM | POA: Diagnosis present

## 2014-09-17 DIAGNOSIS — G8929 Other chronic pain: Secondary | ICD-10-CM | POA: Diagnosis present

## 2014-09-17 DIAGNOSIS — I2782 Chronic pulmonary embolism: Secondary | ICD-10-CM | POA: Diagnosis present

## 2014-09-17 DIAGNOSIS — F319 Bipolar disorder, unspecified: Secondary | ICD-10-CM | POA: Diagnosis not present

## 2014-09-17 DIAGNOSIS — R45851 Suicidal ideations: Secondary | ICD-10-CM | POA: Diagnosis not present

## 2014-09-17 DIAGNOSIS — R569 Unspecified convulsions: Secondary | ICD-10-CM

## 2014-09-17 DIAGNOSIS — R310 Gross hematuria: Secondary | ICD-10-CM | POA: Diagnosis present

## 2014-09-17 DIAGNOSIS — Z89612 Acquired absence of left leg above knee: Secondary | ICD-10-CM | POA: Diagnosis not present

## 2014-09-17 DIAGNOSIS — R319 Hematuria, unspecified: Secondary | ICD-10-CM | POA: Diagnosis present

## 2014-09-17 DIAGNOSIS — G4089 Other seizures: Secondary | ICD-10-CM | POA: Diagnosis present

## 2014-09-17 DIAGNOSIS — D62 Acute posthemorrhagic anemia: Secondary | ICD-10-CM | POA: Diagnosis present

## 2014-09-17 DIAGNOSIS — J9601 Acute respiratory failure with hypoxia: Secondary | ICD-10-CM | POA: Diagnosis present

## 2014-09-17 DIAGNOSIS — B9689 Other specified bacterial agents as the cause of diseases classified elsewhere: Secondary | ICD-10-CM | POA: Diagnosis present

## 2014-09-17 DIAGNOSIS — N390 Urinary tract infection, site not specified: Secondary | ICD-10-CM | POA: Diagnosis present

## 2014-09-17 DIAGNOSIS — R05 Cough: Secondary | ICD-10-CM | POA: Diagnosis present

## 2014-09-17 DIAGNOSIS — Z86718 Personal history of other venous thrombosis and embolism: Secondary | ICD-10-CM

## 2014-09-17 DIAGNOSIS — Z6841 Body Mass Index (BMI) 40.0 and over, adult: Secondary | ICD-10-CM | POA: Diagnosis not present

## 2014-09-17 DIAGNOSIS — J188 Other pneumonia, unspecified organism: Principal | ICD-10-CM | POA: Diagnosis present

## 2014-09-17 DIAGNOSIS — N170 Acute kidney failure with tubular necrosis: Secondary | ICD-10-CM | POA: Diagnosis present

## 2014-09-17 DIAGNOSIS — G9341 Metabolic encephalopathy: Secondary | ICD-10-CM | POA: Diagnosis present

## 2014-09-17 DIAGNOSIS — E785 Hyperlipidemia, unspecified: Secondary | ICD-10-CM | POA: Diagnosis present

## 2014-09-17 DIAGNOSIS — G8220 Paraplegia, unspecified: Secondary | ICD-10-CM | POA: Diagnosis present

## 2014-09-17 DIAGNOSIS — B961 Klebsiella pneumoniae [K. pneumoniae] as the cause of diseases classified elsewhere: Secondary | ICD-10-CM | POA: Diagnosis present

## 2014-09-17 DIAGNOSIS — R400 Somnolence: Secondary | ICD-10-CM | POA: Diagnosis present

## 2014-09-17 DIAGNOSIS — E876 Hypokalemia: Secondary | ICD-10-CM | POA: Diagnosis not present

## 2014-09-17 DIAGNOSIS — Z809 Family history of malignant neoplasm, unspecified: Secondary | ICD-10-CM | POA: Diagnosis not present

## 2014-09-17 DIAGNOSIS — J189 Pneumonia, unspecified organism: Secondary | ICD-10-CM | POA: Diagnosis present

## 2014-09-17 DIAGNOSIS — N132 Hydronephrosis with renal and ureteral calculous obstruction: Secondary | ICD-10-CM | POA: Diagnosis present

## 2014-09-17 DIAGNOSIS — C679 Malignant neoplasm of bladder, unspecified: Secondary | ICD-10-CM | POA: Diagnosis present

## 2014-09-17 DIAGNOSIS — R0600 Dyspnea, unspecified: Secondary | ICD-10-CM

## 2014-09-17 DIAGNOSIS — B1920 Unspecified viral hepatitis C without hepatic coma: Secondary | ICD-10-CM | POA: Diagnosis present

## 2014-09-17 DIAGNOSIS — R4182 Altered mental status, unspecified: Secondary | ICD-10-CM

## 2014-09-17 DIAGNOSIS — A419 Sepsis, unspecified organism: Secondary | ICD-10-CM | POA: Diagnosis present

## 2014-09-17 DIAGNOSIS — T82898A Other specified complication of vascular prosthetic devices, implants and grafts, initial encounter: Secondary | ICD-10-CM

## 2014-09-17 DIAGNOSIS — Z933 Colostomy status: Secondary | ICD-10-CM | POA: Diagnosis not present

## 2014-09-17 DIAGNOSIS — G9340 Encephalopathy, unspecified: Secondary | ICD-10-CM

## 2014-09-17 LAB — CBC WITH DIFFERENTIAL/PLATELET
BASOS ABS: 0 10*3/uL (ref 0.0–0.1)
BASOS PCT: 0 % (ref 0–1)
Eosinophils Absolute: 0.6 10*3/uL (ref 0.0–0.7)
Eosinophils Relative: 4 % (ref 0–5)
HEMATOCRIT: 34.6 % — AB (ref 39.0–52.0)
HEMOGLOBIN: 10.9 g/dL — AB (ref 13.0–17.0)
LYMPHS ABS: 0.9 10*3/uL (ref 0.7–4.0)
LYMPHS PCT: 6 % — AB (ref 12–46)
MCH: 26.2 pg (ref 26.0–34.0)
MCHC: 31.5 g/dL (ref 30.0–36.0)
MCV: 83.2 fL (ref 78.0–100.0)
MONO ABS: 0.5 10*3/uL (ref 0.1–1.0)
MONOS PCT: 3 % (ref 3–12)
NEUTROS ABS: 11.7 10*3/uL — AB (ref 1.7–7.7)
Neutrophils Relative %: 87 % — ABNORMAL HIGH (ref 43–77)
Platelets: 271 10*3/uL (ref 150–400)
RBC: 4.16 MIL/uL — ABNORMAL LOW (ref 4.22–5.81)
RDW: 15.3 % (ref 11.5–15.5)
WBC: 13.6 10*3/uL — ABNORMAL HIGH (ref 4.0–10.5)

## 2014-09-17 LAB — I-STAT ARTERIAL BLOOD GAS, ED
Acid-base deficit: 2 mmol/L (ref 0.0–2.0)
BICARBONATE: 24.1 meq/L — AB (ref 20.0–24.0)
O2 SAT: 93 %
PCO2 ART: 48.2 mmHg — AB (ref 35.0–45.0)
PH ART: 7.314 — AB (ref 7.350–7.450)
Patient temperature: 101
TCO2: 25 mmol/L (ref 0–100)
pO2, Arterial: 80 mmHg (ref 80.0–100.0)

## 2014-09-17 LAB — COMPREHENSIVE METABOLIC PANEL
ALBUMIN: 3.3 g/dL — AB (ref 3.5–5.2)
ALT: 15 U/L (ref 0–53)
ANION GAP: 6 (ref 5–15)
AST: 17 U/L (ref 0–37)
Alkaline Phosphatase: 67 U/L (ref 39–117)
BUN: 17 mg/dL (ref 6–23)
CO2: 30 mmol/L (ref 19–32)
Calcium: 9 mg/dL (ref 8.4–10.5)
Chloride: 108 mmol/L (ref 96–112)
Creatinine, Ser: 1.92 mg/dL — ABNORMAL HIGH (ref 0.50–1.35)
GFR calc non Af Amer: 37 mL/min — ABNORMAL LOW (ref >90)
GFR, EST AFRICAN AMERICAN: 43 mL/min — AB (ref >90)
GLUCOSE: 124 mg/dL — AB (ref 70–99)
Potassium: 3.3 mmol/L — ABNORMAL LOW (ref 3.5–5.1)
SODIUM: 144 mmol/L (ref 135–145)
Total Bilirubin: 0.5 mg/dL (ref 0.3–1.2)
Total Protein: 6.5 g/dL (ref 6.0–8.3)

## 2014-09-17 LAB — BRAIN NATRIURETIC PEPTIDE: B Natriuretic Peptide: 41 pg/mL (ref 0.0–100.0)

## 2014-09-17 LAB — I-STAT CHEM 8, ED
BUN: 19 mg/dL (ref 6–23)
CREATININE: 1.8 mg/dL — AB (ref 0.50–1.35)
Calcium, Ion: 1.17 mmol/L (ref 1.12–1.23)
Chloride: 104 mmol/L (ref 96–112)
Glucose, Bld: 123 mg/dL — ABNORMAL HIGH (ref 70–99)
HCT: 36 % — ABNORMAL LOW (ref 39.0–52.0)
Hemoglobin: 12.2 g/dL — ABNORMAL LOW (ref 13.0–17.0)
Potassium: 3.3 mmol/L — ABNORMAL LOW (ref 3.5–5.1)
Sodium: 143 mmol/L (ref 135–145)
TCO2: 24 mmol/L (ref 0–100)

## 2014-09-17 LAB — URINALYSIS, ROUTINE W REFLEX MICROSCOPIC
Bilirubin Urine: NEGATIVE
Glucose, UA: NEGATIVE mg/dL
KETONES UR: 15 mg/dL — AB
Nitrite: NEGATIVE
Protein, ur: 100 mg/dL — AB
Specific Gravity, Urine: 1.01 (ref 1.005–1.030)
Urobilinogen, UA: 0.2 mg/dL (ref 0.0–1.0)
pH: 7 (ref 5.0–8.0)

## 2014-09-17 LAB — URINE MICROSCOPIC-ADD ON

## 2014-09-17 LAB — I-STAT CG4 LACTIC ACID, ED
LACTIC ACID, VENOUS: 1.13 mmol/L (ref 0.5–2.0)
Lactic Acid, Venous: 1.84 mmol/L (ref 0.5–2.0)

## 2014-09-17 LAB — I-STAT TROPONIN, ED: Troponin i, poc: 0 ng/mL (ref 0.00–0.08)

## 2014-09-17 LAB — PROTIME-INR
INR: 2.4 — ABNORMAL HIGH (ref 0.00–1.49)
PROTHROMBIN TIME: 26.4 s — AB (ref 11.6–15.2)

## 2014-09-17 MED ORDER — PREGABALIN 50 MG PO CAPS
75.0000 mg | ORAL_CAPSULE | Freq: Two times a day (BID) | ORAL | Status: DC
  Administered 2014-09-17 – 2014-09-22 (×10): 75 mg via ORAL
  Filled 2014-09-17: qty 3
  Filled 2014-09-17 (×5): qty 1
  Filled 2014-09-17 (×3): qty 3
  Filled 2014-09-17 (×2): qty 1

## 2014-09-17 MED ORDER — WARFARIN SODIUM 4 MG PO TABS
4.0000 mg | ORAL_TABLET | ORAL | Status: AC
  Administered 2014-09-18: 4 mg via ORAL
  Filled 2014-09-17: qty 1

## 2014-09-17 MED ORDER — LEVOTHYROXINE SODIUM 125 MCG PO TABS
125.0000 ug | ORAL_TABLET | Freq: Every day | ORAL | Status: DC
  Administered 2014-09-18 – 2014-09-22 (×5): 125 ug via ORAL
  Filled 2014-09-17 (×7): qty 1

## 2014-09-17 MED ORDER — SODIUM CHLORIDE 0.9 % IV BOLUS (SEPSIS)
500.0000 mL | INTRAVENOUS | Status: AC
  Administered 2014-09-17: 500 mL via INTRAVENOUS

## 2014-09-17 MED ORDER — DOCUSATE SODIUM 100 MG PO CAPS
100.0000 mg | ORAL_CAPSULE | Freq: Two times a day (BID) | ORAL | Status: DC
  Administered 2014-09-17 – 2014-09-22 (×9): 100 mg via ORAL
  Filled 2014-09-17 (×15): qty 1

## 2014-09-17 MED ORDER — ATORVASTATIN CALCIUM 10 MG PO TABS
10.0000 mg | ORAL_TABLET | Freq: Every day | ORAL | Status: DC
  Administered 2014-09-17 – 2014-09-30 (×12): 10 mg via ORAL
  Filled 2014-09-17 (×15): qty 1

## 2014-09-17 MED ORDER — FENTANYL CITRATE 0.05 MG/ML IJ SOLN
25.0000 ug | Freq: Once | INTRAMUSCULAR | Status: AC
  Administered 2014-09-17: 25 ug via INTRAVENOUS
  Filled 2014-09-17: qty 2

## 2014-09-17 MED ORDER — QUETIAPINE FUMARATE 100 MG PO TABS
100.0000 mg | ORAL_TABLET | Freq: Two times a day (BID) | ORAL | Status: DC
  Administered 2014-09-18 – 2014-10-01 (×19): 100 mg via ORAL
  Filled 2014-09-17 (×27): qty 1

## 2014-09-17 MED ORDER — PREDNISONE 5 MG PO TABS
10.0000 mg | ORAL_TABLET | Freq: Every day | ORAL | Status: DC
  Administered 2014-09-18 – 2014-10-01 (×11): 10 mg via ORAL
  Filled 2014-09-17 (×4): qty 1
  Filled 2014-09-17: qty 2
  Filled 2014-09-17 (×3): qty 1
  Filled 2014-09-17: qty 2
  Filled 2014-09-17 (×2): qty 1
  Filled 2014-09-17: qty 2
  Filled 2014-09-17 (×2): qty 1
  Filled 2014-09-17: qty 2

## 2014-09-17 MED ORDER — FENTANYL 25 MCG/HR TD PT72
50.0000 ug | MEDICATED_PATCH | TRANSDERMAL | Status: DC
Start: 2014-09-17 — End: 2014-09-23
  Administered 2014-09-17 – 2014-09-20 (×2): 50 ug via TRANSDERMAL
  Filled 2014-09-17 (×2): qty 1

## 2014-09-17 MED ORDER — ONDANSETRON HCL 4 MG/2ML IJ SOLN
4.0000 mg | Freq: Four times a day (QID) | INTRAMUSCULAR | Status: DC | PRN
  Administered 2014-09-26 – 2014-09-29 (×2): 4 mg via INTRAVENOUS
  Filled 2014-09-17 (×2): qty 2

## 2014-09-17 MED ORDER — ONDANSETRON HCL 4 MG PO TABS: 4.0000 mg | ORAL_TABLET | Freq: Four times a day (QID) | ORAL | Status: DC | PRN

## 2014-09-17 MED ORDER — PANTOPRAZOLE SODIUM 40 MG PO TBEC
40.0000 mg | DELAYED_RELEASE_TABLET | Freq: Two times a day (BID) | ORAL | Status: DC
  Administered 2014-09-17 – 2014-09-22 (×10): 40 mg via ORAL
  Filled 2014-09-17 (×10): qty 1

## 2014-09-17 MED ORDER — METOCLOPRAMIDE HCL 5 MG PO TABS
5.0000 mg | ORAL_TABLET | Freq: Three times a day (TID) | ORAL | Status: DC
  Administered 2014-09-17 – 2014-09-22 (×18): 5 mg via ORAL
  Filled 2014-09-17 (×28): qty 1

## 2014-09-17 MED ORDER — OXYBUTYNIN CHLORIDE ER 5 MG PO TB24
5.0000 mg | ORAL_TABLET | Freq: Every day | ORAL | Status: DC
  Administered 2014-09-18 – 2014-10-01 (×11): 5 mg via ORAL
  Filled 2014-09-17 (×13): qty 1

## 2014-09-17 MED ORDER — DULOXETINE HCL 60 MG PO CPEP
60.0000 mg | ORAL_CAPSULE | Freq: Every day | ORAL | Status: DC
  Administered 2014-09-17 – 2014-09-20 (×3): 60 mg via ORAL
  Filled 2014-09-17 (×7): qty 1

## 2014-09-17 MED ORDER — BACLOFEN 20 MG PO TABS
20.0000 mg | ORAL_TABLET | ORAL | Status: DC
  Administered 2014-09-17 – 2014-09-22 (×15): 20 mg via ORAL
  Filled 2014-09-17 (×21): qty 1

## 2014-09-17 MED ORDER — VANCOMYCIN HCL IN DEXTROSE 1-5 GM/200ML-% IV SOLN: 1000.0000 mg | Freq: Once | INTRAVENOUS | Status: DC

## 2014-09-17 MED ORDER — WARFARIN SODIUM 4 MG PO TABS
4.5000 mg | ORAL_TABLET | Freq: Once | ORAL | Status: AC
  Administered 2014-09-17: 4.5 mg via ORAL
  Filled 2014-09-17: qty 1

## 2014-09-17 MED ORDER — LAMOTRIGINE 200 MG PO TABS
200.0000 mg | ORAL_TABLET | Freq: Two times a day (BID) | ORAL | Status: DC
  Administered 2014-09-18 – 2014-10-01 (×21): 200 mg via ORAL
  Filled 2014-09-17 (×32): qty 1

## 2014-09-17 MED ORDER — ACETAMINOPHEN 325 MG PO TABS
650.0000 mg | ORAL_TABLET | Freq: Four times a day (QID) | ORAL | Status: DC | PRN
  Administered 2014-09-24 – 2014-09-29 (×5): 650 mg via ORAL
  Filled 2014-09-17 (×6): qty 2

## 2014-09-17 MED ORDER — SODIUM CHLORIDE 0.9 % IJ SOLN
3.0000 mL | Freq: Two times a day (BID) | INTRAMUSCULAR | Status: DC
  Administered 2014-09-17 – 2014-10-01 (×20): 3 mL via INTRAVENOUS

## 2014-09-17 MED ORDER — SODIUM CHLORIDE 0.9 % IV SOLN
1.0000 g | Freq: Two times a day (BID) | INTRAVENOUS | Status: DC
  Administered 2014-09-17 – 2014-09-22 (×11): 1 g via INTRAVENOUS
  Filled 2014-09-17 (×14): qty 1

## 2014-09-17 MED ORDER — ALBUTEROL SULFATE (2.5 MG/3ML) 0.083% IN NEBU
2.5000 mg | INHALATION_SOLUTION | RESPIRATORY_TRACT | Status: DC | PRN
  Administered 2014-09-18 – 2014-09-30 (×11): 2.5 mg via RESPIRATORY_TRACT
  Filled 2014-09-17 (×11): qty 3

## 2014-09-17 MED ORDER — SODIUM CHLORIDE 0.9 % IV SOLN
INTRAVENOUS | Status: DC
  Administered 2014-09-17: 22:00:00 via INTRAVENOUS

## 2014-09-17 MED ORDER — WARFARIN - PHARMACIST DOSING INPATIENT: Freq: Every day | Status: DC

## 2014-09-17 MED ORDER — PIPERACILLIN-TAZOBACTAM 3.375 G IVPB
3.3750 g | Freq: Three times a day (TID) | INTRAVENOUS | Status: DC
  Filled 2014-09-17 (×2): qty 50

## 2014-09-17 MED ORDER — SODIUM CHLORIDE 0.9 % IV BOLUS (SEPSIS)
1000.0000 mL | INTRAVENOUS | Status: AC
  Administered 2014-09-17 (×3): 1000 mL via INTRAVENOUS

## 2014-09-17 MED ORDER — OXYCODONE HCL 5 MG PO TABS
5.0000 mg | ORAL_TABLET | ORAL | Status: DC | PRN
  Administered 2014-09-18: 5 mg via ORAL
  Administered 2014-09-18 – 2014-09-20 (×5): 10 mg via ORAL
  Administered 2014-09-21: 5 mg via ORAL
  Administered 2014-09-21: 10 mg via ORAL
  Administered 2014-09-21 – 2014-09-22 (×2): 5 mg via ORAL
  Filled 2014-09-17: qty 2
  Filled 2014-09-17: qty 1
  Filled 2014-09-17: qty 2
  Filled 2014-09-17 (×2): qty 1
  Filled 2014-09-17 (×2): qty 2
  Filled 2014-09-17: qty 1
  Filled 2014-09-17 (×2): qty 2

## 2014-09-17 MED ORDER — VANCOMYCIN HCL 10 G IV SOLR
1250.0000 mg | INTRAVENOUS | Status: DC
  Administered 2014-09-18 – 2014-09-21 (×4): 1250 mg via INTRAVENOUS
  Filled 2014-09-17 (×5): qty 1250

## 2014-09-17 MED ORDER — VANCOMYCIN HCL 10 G IV SOLR
1500.0000 mg | INTRAVENOUS | Status: AC
  Administered 2014-09-17: 1500 mg via INTRAVENOUS
  Filled 2014-09-17: qty 1500

## 2014-09-17 MED ORDER — PIPERACILLIN-TAZOBACTAM 3.375 G IVPB 30 MIN
3.3750 g | Freq: Once | INTRAVENOUS | Status: AC
  Administered 2014-09-17: 3.375 g via INTRAVENOUS
  Filled 2014-09-17: qty 50

## 2014-09-17 MED ORDER — VANCOMYCIN HCL 10 G IV SOLR: 1250.0000 mg | INTRAVENOUS | Status: DC

## 2014-09-17 MED ORDER — ACETAMINOPHEN 650 MG RE SUPP
650.0000 mg | Freq: Four times a day (QID) | RECTAL | Status: DC | PRN
  Filled 2014-09-17: qty 1

## 2014-09-17 MED ORDER — WARFARIN SODIUM 4 MG PO TABS
4.5000 mg | ORAL_TABLET | ORAL | Status: DC
  Filled 2014-09-17: qty 1

## 2014-09-17 MED ORDER — CLONAZEPAM 0.5 MG PO TABS
0.2500 mg | ORAL_TABLET | Freq: Two times a day (BID) | ORAL | Status: DC | PRN
  Administered 2014-09-18 – 2014-09-19 (×2): 0.25 mg via ORAL
  Filled 2014-09-17 (×2): qty 1

## 2014-09-17 MED ORDER — SACCHAROMYCES BOULARDII 250 MG PO CAPS
250.0000 mg | ORAL_CAPSULE | Freq: Two times a day (BID) | ORAL | Status: DC
  Administered 2014-09-17 – 2014-10-01 (×23): 250 mg via ORAL
  Filled 2014-09-17 (×31): qty 1

## 2014-09-17 NOTE — ED Notes (Signed)
Attempted report 
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
## 2014-09-17 NOTE — ED Provider Notes (Signed)
6:15 PM patient feels improved after treatment with intravenous fluids, and antibiotics. He is alert appropriate. Glasgow Coma Score 15. 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 Critical care was necessary to treat or prevent imminent or life-threatening deterioration. Critical care was time spent personally by me on the following activities: development of treatment plan with patient and/or surrogate as well as nursing, discussions with consultants, evaluation of patient's response to treatment, examination of patient, obtaining history from patient or surrogate, ordering and performing treatments and interventions, ordering and review of laboratory studies, ordering and review of radiographic studies, pulse oximetry and re-evaluation of patient's condition.  Orlie Dakin, MD 09/17/14 [phone]

## 2014-09-17 NOTE — Progress Notes (Addendum)
Called ED for report. RN giving report on another patient at this time and will call back.  M.Forest Gleason, RN
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
## 2014-09-17 NOTE — ED Notes (Signed)
[facility] contacted to call level 2 Code Sepsis

## 2014-09-17 NOTE — ED Notes (Signed)
Lab at bedside
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
## 2014-09-17 NOTE — Progress Notes (Addendum)
Patient ID: Samuel [name], male   DOB: 15-Feb-1956, 59 y.o.   MRN: [id number]               PROGRESS NOTE  DATE:  09/12/2014              FACILITY: [facility]                        LEVEL OF CARE:   SNF   Acute Visit   CHIEF COMPLAINT:   Final preoperative review, shortness of breath.                 HISTORY OF PRESENT ILLNESS:   I came to see Samuel [name] today simply to follow up on anticoagulation prior to his bladder tumor resection which is scheduled for 09/26/2014.    On my arrival, he stated his reflux was bad all night.  He is short of breath and wheezing.  He tells me that he has not been running a fever.  He has no chest pain.  States his reflux "has been really very problematic".     PHYSICAL EXAMINATION:   VITAL SIGNS:   O2 SATURATIONS:  95% on room air.    RESPIRATIONS:    28.    PULSE:     80.    GENERAL APPEARANCE:  The patient looks uncomfortable, although he is alert and orientated.   CHEST/RESPIRATORY:  Shallow air entry bilaterally.   Prolonged expiratory phase with end-expiratory wheezing.   CARDIOVASCULAR:   CARDIAC:   I see no signs of congestive heart failure.   GASTROINTESTINAL:   ABDOMEN:  Soft.   Multiple surgical scars.    LIVER/SPLEEN/KIDNEYS:  No liver, no spleen.  No tenderness.   CIRCULATION:   EDEMA/VARICOSITIES:  Extremities:  In his remaining right lower extremity, there is no evidence of a DVT.    ASSESSMENT/PLAN:                                     Once again, I think there is an issue of bronchospasm here.  Whether this is all continued acid reflux, or he may have a component of COPD and/or aspiration, I am uncertain.  Nevertheless, this is going to need to be managed.  I am going to give him a steroid taper, increase his nebulizers.  He will need a chest x-ray.    Anticoagulation issues as it applies to his urologic surgery on March 1st.  I will stop his Coumadin three days before his surgery.  As noted last week, I do not feel  strongly about bridging him at this point.    Hematuria.  He is still putting out a large amount of bloody urine.  His hemoglobin was 12.1 on 09/04/2014.  I will repeat this.

## 2014-09-17 NOTE — ED Notes (Signed)
X-ray at bedside
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
## 2014-09-17 NOTE — ED Provider Notes (Signed)
Brought by EMS unstable vital signs, hypoxia patient reports coughing for the past 2.5 days. EMS reports to 101 in the field that you're a fluid bolus Admits to shortness of breath. EMS reports patient had pulse oximetry in the 70s on 2 L while at skilled nursing facility. Patient treated by EMS with oxygen nonrebreather mask. Patient denies that he is feeling tired to breathe. Presently patient is alert Glasgow Coma Score 15. Patient chronically ill-appearing HEENT exam normocephalic atraumatic extremities moist neck supple lungs tachypnea, heart tachycardic regular rhythm abdomen morbidly obese, colostomy in place. Extremities left AKA otherwise without edema. Code sepsis called  Orlie Dakin, MD 09/18/14 [phone]

## 2014-09-17 NOTE — ED Notes (Signed)
Pt report to the ED for eval of lethargy and hypoxia. Pt arrives via [facility] with NRB in place. Pt 88-89% on NRB. Pt also reports cough. Temp en route 101 temporal. Pt received 1,000 mg and 4 mg of Zofran en route. Pt has a catheter in place. Blood noted from site and in tubing. Colosotmy noted in LLQ. Pt reports normal output from his colostomy. Pt was pale and diaphoretic on EMS arrival. Denies any CP. Pt A&Ox4. Skin pale and clammy. O2 sats 94% on NRB at this time. Hr 118s. Pt tachypnic.

## 2014-09-18 ENCOUNTER — Inpatient Hospital Stay (HOSPITAL_COMMUNITY): Payer: Medicare Other

## 2014-09-18 LAB — CBC
HCT: 29.7 % — ABNORMAL LOW (ref 39.0–52.0)
Hemoglobin: 9.3 g/dL — ABNORMAL LOW (ref 13.0–17.0)
MCH: 26.6 pg (ref 26.0–34.0)
MCHC: 31.3 g/dL (ref 30.0–36.0)
MCV: 85.1 fL (ref 78.0–100.0)
PLATELETS: 269 10*3/uL (ref 150–400)
RBC: 3.49 MIL/uL — ABNORMAL LOW (ref 4.22–5.81)
RDW: 15.5 % (ref 11.5–15.5)
WBC: 20.1 10*3/uL — ABNORMAL HIGH (ref 4.0–10.5)

## 2014-09-18 LAB — INFLUENZA PANEL BY PCR (TYPE A & B)
H1N1 flu by pcr: NOT DETECTED
Influenza A By PCR: NEGATIVE
Influenza B By PCR: NEGATIVE

## 2014-09-18 LAB — COMPREHENSIVE METABOLIC PANEL
ALT: 13 U/L (ref 0–53)
AST: 15 U/L (ref 0–37)
Albumin: 2.6 g/dL — ABNORMAL LOW (ref 3.5–5.2)
Alkaline Phosphatase: 51 U/L (ref 39–117)
Anion gap: 7 (ref 5–15)
BILIRUBIN TOTAL: 0.5 mg/dL (ref 0.3–1.2)
BUN: 17 mg/dL (ref 6–23)
CO2: 25 mmol/L (ref 19–32)
Calcium: 7.8 mg/dL — ABNORMAL LOW (ref 8.4–10.5)
Chloride: 111 mmol/L (ref 96–112)
Creatinine, Ser: 1.97 mg/dL — ABNORMAL HIGH (ref 0.50–1.35)
GFR calc Af Amer: 42 mL/min — ABNORMAL LOW (ref >90)
GFR calc non Af Amer: 36 mL/min — ABNORMAL LOW (ref >90)
Glucose, Bld: 111 mg/dL — ABNORMAL HIGH (ref 70–99)
Potassium: 3.5 mmol/L (ref 3.5–5.1)
Sodium: 143 mmol/L (ref 135–145)
Total Protein: 5.3 g/dL — ABNORMAL LOW (ref 6.0–8.3)

## 2014-09-18 LAB — PROTIME-INR
INR: 2.93 — ABNORMAL HIGH (ref 0.00–1.49)
Prothrombin Time: 30.8 seconds — ABNORMAL HIGH (ref 11.6–15.2)

## 2014-09-18 LAB — STREP PNEUMONIAE URINARY ANTIGEN: Strep Pneumo Urinary Antigen: NEGATIVE

## 2014-09-18 LAB — MRSA PCR SCREENING: MRSA by PCR: POSITIVE — AB

## 2014-09-18 MED ORDER — GI COCKTAIL ~~LOC~~
30.0000 mL | Freq: Once | ORAL | Status: AC
  Administered 2014-09-18: 30 mL via ORAL
  Filled 2014-09-18: qty 30

## 2014-09-18 MED ORDER — SODIUM CHLORIDE 0.9 % IV BOLUS (SEPSIS)
500.0000 mL | Freq: Once | INTRAVENOUS | Status: AC
  Administered 2014-09-18: 500 mL via INTRAVENOUS

## 2014-09-18 MED ORDER — CHLORHEXIDINE GLUCONATE CLOTH 2 % EX PADS
6.0000 | MEDICATED_PAD | Freq: Every day | CUTANEOUS | Status: AC
  Administered 2014-09-18 – 2014-09-19 (×2): 6 via TOPICAL

## 2014-09-18 MED ORDER — CETYLPYRIDINIUM CHLORIDE 0.05 % MT LIQD
7.0000 mL | Freq: Four times a day (QID) | OROMUCOSAL | Status: DC
  Administered 2014-09-18: 7 mL via OROMUCOSAL

## 2014-09-18 MED ORDER — CHLORHEXIDINE GLUCONATE 0.12 % MT SOLN
15.0000 mL | Freq: Two times a day (BID) | OROMUCOSAL | Status: DC
  Filled 2014-09-18 (×3): qty 15

## 2014-09-18 MED ORDER — SODIUM CHLORIDE 0.9 % IV SOLN
INTRAVENOUS | Status: DC
  Administered 2014-09-18: 100 mL/h via INTRAVENOUS

## 2014-09-18 MED ORDER — MUPIROCIN 2 % EX OINT
1.0000 "application " | TOPICAL_OINTMENT | Freq: Two times a day (BID) | CUTANEOUS | Status: AC
  Administered 2014-09-18 – 2014-09-22 (×10): 1 via NASAL
  Filled 2014-09-18 (×2): qty 22

## 2014-09-18 MED ORDER — CALCIUM CARBONATE ANTACID 500 MG PO CHEW
400.0000 mg | CHEWABLE_TABLET | Freq: Three times a day (TID) | ORAL | Status: DC | PRN
  Administered 2014-09-18 – 2014-09-30 (×10): 400 mg via ORAL
  Filled 2014-09-18: qty 2
  Filled 2014-09-18: qty 1
  Filled 2014-09-18 (×3): qty 2
  Filled 2014-09-18: qty 1
  Filled 2014-09-18 (×2): qty 2
  Filled 2014-09-18: qty 1
  Filled 2014-09-18 (×2): qty 2
  Filled 2014-09-18 (×3): qty 1

## 2014-09-18 MED ORDER — POLYVINYL ALCOHOL 1.4 % OP SOLN
1.0000 [drp] | OPHTHALMIC | Status: DC | PRN
  Administered 2014-09-18 – 2014-09-19 (×3): 1 [drp] via OPHTHALMIC
  Filled 2014-09-18: qty 15

## 2014-09-18 MED ORDER — SODIUM CHLORIDE 0.9 % IJ SOLN
10.0000 mL | INTRAMUSCULAR | Status: DC | PRN
  Administered 2014-09-18 – 2014-09-29 (×8): 10 mL
  Filled 2014-09-18 (×8): qty 40

## 2014-09-18 NOTE — Progress Notes (Signed)
[facility] PROGRESS NOTE  Samuel [name] YJE:[id number] DOB: [birth year] DOA: 09/17/2014 PCP: [name], MD  Assessment/Plan: 1. Healthcare associated pneumonia -Patient is a nursing home resident presented as a transfer yesterday with clinical signs symptoms consistent with pneumonia. Chest x-ray which showed a left lower lobe infiltrate. -Pending blood cultures -Continue broad-spectrum IV antimicrobial therapy with meropenem and vancomycin -Repeat chest x-ray in a.m.  2.   Sepsis -Present on admission, evidenced by a white count of 20,000, development of acute kidney injury, heart rate of 113, respiratory to 28. -Source of infection likely healthcare associated pneumonia, continue broad-spectrum IV antimicrobial therapy with vancomycin and meropenem -Urinalysis did show the presence of many bacteria and large leukocytes, although patient does have history of chronic Foley catheter -Continue IV fluids, empiric antimicrobial therapy, follow-up on cultures.  3.  Acute kidney injury. -Patient presenting with an elevated creatinine of 1.9, having a normal kidney function on 08/25/2014 -Likely secondary to critical illness, sepsis and hypovolemia. -Patient bolused with normal saline this morning, continue running maintenance fluids -Repeat BMP in a.m.  4.  Question urinary tract infection. -Urinalysis performed on admission showed the presence of bacteria along with white blood cells. It's unclear if this represents an active infection as he has a history of chronic Foley catheter placement. He is currently on meropenem therapy for healthcare associated pneumonia, which would also cover gram negatives  5.  History of pulmonary embolism. -Continue chronic anticoagulation with warfarin, resides with therapeutic INR of 2.4. -Pharmacy consulted for warfarin management  6.  Hematuria -Urine seems to be clearing up this morning, may have been related to recent Foley catheter  exchange, aggravated by his anticoagulation.   Code Status: Full code Family Communication:  Disposition Plan: Continue broad-spectrum IV antimicrobial therapy, close monitoring and step down unit   Consultants:  Pharmacy  Antibiotics:  Vancomycin (started on 09/17/2014)  Meropenem (started on 09/17/2014)  HPI/Subjective: Patient is a pleasant 59 year old woman with past medical history paraplegia, neurogenic bladder, chronic indwelling Foley catheter, status post left lower extremity amputation, presented as a transfer from his skilled nursing facility to the emergency department on 09/17/2014 with complaints of increasing cough and shortness of breath. Workup included a chest x-ray which revealed left lower lobe infiltrate. Initial lab showed white count of 13,600 which trended up to 20,000 on the following day. He was started on broad-spectrum IV antimicrobial therapy with meropenem and vancomycin for presumed healthcare associated pneumonia. Patient was also found to have acute kidney injury presenting with a creatinine 1.92. Previous lab work on 08/25/2014 showed a creatinine 1.0. He was started on IV fluids. Cultures are pending at the time of this dictation.  Objective: Filed Vitals:   09/18/14 1300  BP: 138/68  Pulse: 81  Temp: 98.4 F (36.9 C)  Resp: 24    Intake/Output Summary (Last 24 hours) at 09/18/14 1409 Last data filed at 09/18/14 1300  Gross per 24 hour  Intake 1677.5 ml  Output   2650 ml  Net -972.5 ml   Filed Weights   09/17/14 2000  Weight: 116.4 kg (256 lb 9.9 oz)    Exam:   General:  Ill-appearing, no acute distress though, awake alert oriented  Cardiovascular: Tachycardic, regular rate rhythm normal S1-S2  Respiratory: Patient having coarse respiratory sounds, positive rhonchi, bibasilar crackles, no wheezing  Abdomen: Obese, soft nontender nondistended  Musculoskeletal: Status post left above-the-knee amputation, he has a stage II  decubitus ulcer present as well. There is trace edema to his right lower  extremity  Data Reviewed: Basic Metabolic Panel:  Recent Labs Lab 09/17/14 1651 09/17/14 1705 09/18/14 0309  NA 144 143 143  K 3.3* 3.3* 3.5  CL 108 104 111  CO2 30  --  25  GLUCOSE 124* 123* 111*  BUN 17 19 17   CREATININE 1.92* 1.80* 1.97*  CALCIUM 9.0  --  7.8*   Liver Function Tests:  Recent Labs Lab 09/17/14 1651 09/18/14 0309  AST 17 15  ALT 15 13  ALKPHOS 67 51  BILITOT 0.5 0.5  PROT 6.5 5.3*  ALBUMIN 3.3* 2.6*   No results for input(s): LIPASE, AMYLASE in the last 168 hours. No results for input(s): AMMONIA in the last 168 hours. CBC:  Recent Labs Lab 09/17/14 1651 09/17/14 1705 09/18/14 0309  WBC 13.6*  --  20.1*  NEUTROABS 11.7*  --   --   HGB 10.9* 12.2* 9.3*  HCT 34.6* 36.0* 29.7*  MCV 83.2  --  85.1  PLT 271  --  269   Cardiac Enzymes: No results for input(s): CKTOTAL, CKMB, CKMBINDEX, TROPONINI in the last 168 hours. BNP (last 3 results)  Recent Labs  09/17/14 1651  BNP 41.0    ProBNP (last 3 results)  Recent Labs  09/19/13 1415 12/07/13 2221  PROBNP 123.8 56.9    CBG: No results for input(s): GLUCAP in the last 168 hours.  Recent Results (from the past 240 hour(s))  Urine culture     Status: None (Preliminary result)   Collection Time: 09/17/14  4:29 PM  Result Value Ref Range Status   Specimen Description URINE, CATHETERIZED  Final   Special Requests NONE  Final   Colony Count   Final    >=100,000 COLONIES/ML Performed at [facility]    Culture   Final    [place] Performed at [facility]    Report Status PENDING  Incomplete  MRSA PCR Screening     Status: Abnormal   Collection Time: 09/17/14  8:10 PM  Result Value Ref Range Status   MRSA by PCR POSITIVE (A) NEGATIVE Final    Comment:        The GeneXpert MRSA Assay (FDA approved for NASAL specimens only), is one component of a comprehensive MRSA  colonization surveillance program. It is not intended to diagnose MRSA infection nor to guide or monitor treatment for MRSA infections. RESULT CALLED TO, READ BACK BY AND VERIFIED WITH: PETTIFORD,A RN [phone] 09/18/14 MITCHELL,L      Studies: Dg Chest Port 1 View  09/17/2014   CLINICAL DATA:  Initial evaluation for shortness of breath  EXAM: PORTABLE CHEST - 1 VIEW  COMPARISON:  06/28/2014  FINDINGS: Very limited inspiratory effect. Right lung clear except for mild atelectasis at the base. The retrocardiac left lower lobe is difficult to evaluate. The left upper lobe is clear.  IMPRESSION: Limited study due to very limited inspiratory effect. Retrocardiac left lower lobe difficult to evaluate but left lower lobe infiltrate suspected.   Electronically Signed   By: Skipper Cliche M.D.   On: 09/17/2014 16:30    Scheduled Meds: . atorvastatin  10 mg Oral QHS  . baclofen  20 mg Oral 3 times per day  . Chlorhexidine Gluconate Cloth  6 each Topical Q0600  . docusate sodium  100 mg Oral BID  . DULoxetine  60 mg Oral QHS  . fentaNYL  50 mcg Transdermal Q72H  . lamoTRIgine  200 mg Oral BID  . levothyroxine  125 mcg Oral  QAC breakfast  . meropenem (MERREM) IV  1 g Intravenous Q12H  . metoCLOPramide  5 mg Oral TID AC & HS  . mupirocin ointment  1 application Nasal BID  . oxybutynin  5 mg Oral Daily  . pantoprazole  40 mg Oral BID  . predniSONE  10 mg Oral Q breakfast  . pregabalin  75 mg Oral BID  . QUEtiapine  100 mg Oral BID  . saccharomyces boulardii  250 mg Oral BID  . sodium chloride  3 mL Intravenous Q12H  . vancomycin  1,250 mg Intravenous Q24H  . warfarin  4 mg Oral Once per day on Mon Wed Fri Sat  . [START ON 09/19/2014] warfarin  4.5 mg Oral Once per day on Sun Tue Thu  . Warfarin - Pharmacist Dosing Inpatient   Does not apply q1800   Continuous Infusions:   Principal Problem:   HCAP (healthcare-associated pneumonia) Active Problems:   Chronic pain   Sepsis   UTI (lower  urinary tract infection)   ARF (acute renal failure)   Somnolence   Hematuria   Acute respiratory failure with hypoxia    Time spent: 35 minutes    Kelvin Cellar  [facility] Pager [phone]. If 7PM-7AM, please contact night-coverage at [email or web address], password [facility] 09/18/2014, 2:09 PM  LOS: 1 day
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
## 2014-09-18 NOTE — Clinical Documentation Improvement (Signed)
"  Acute Renal Failure" documented in current medical record.  Please document the Clinical Indicators for the diagnosis of acute renal failure.    Thank You, Erling Conte ,RN Clinical Documentation Specialist:  [phone] [place] Information Management

## 2014-09-18 NOTE — Progress Notes (Signed)
Utilization Review Completed.  

## 2014-09-18 NOTE — Clinical Social Work Psychosocial (Signed)
Clinical Social Work Department BRIEF PSYCHOSOCIAL ASSESSMENT 09/18/2014  Patient:  Samuel [name], Samuel [name]     Account Number:  [id number]     Admit date:  09/17/2014  Clinical Social Worker:  Domenica Reamer, [place]  Date/Time:  09/18/2014 01:24 PM  Referred by:  Physician  Date Referred:  09/18/2014 Referred for  SNF Placement   Other Referral:   Interview type:  Patient Other interview type:    PSYCHOSOCIAL DATA Living Status:  FACILITY Admitted from facility:  [facility] Level of care:  [place] Primary support name:  Samuel [name] Primary support relationship to patient:  CHILD, ADULT Degree of support available:   unclear level of support    CURRENT CONCERNS Current Concerns  Post-Acute Placement   Other Concerns:    SOCIAL WORK ASSESSMENT / PLAN CSW spoke with patient at bedside concerning return to SNF at time of DC- patient does not remember the name of the facility that he came from but is agreeable to returning to facility where he has lived for 1 year- patient states that he is working on going to another facility with the Education officer, museum at [employer]   Assessment/plan status:  Psychosocial Support/Ongoing Assessment of Needs Other assessment/ plan:   FL2 update   Information/referral to community resources:    PATIENT'S/FAMILY'S RESPONSE TO PLAN OF CARE: Patient is agreeable to discharge back to [place] but wishes he didn't need to be in a skilled facility       Domenica Reamer, [place] Worker [phone]

## 2014-09-19 ENCOUNTER — Inpatient Hospital Stay (HOSPITAL_COMMUNITY): Payer: Medicare Other

## 2014-09-19 DIAGNOSIS — N1339 Other hydronephrosis: Secondary | ICD-10-CM

## 2014-09-19 LAB — PROTIME-INR
INR: 3.75 — AB (ref 0.00–1.49)
PROTHROMBIN TIME: 37.3 s — AB (ref 11.6–15.2)

## 2014-09-19 LAB — CBC
HEMATOCRIT: 27.4 % — AB (ref 39.0–52.0)
Hemoglobin: 8.5 g/dL — ABNORMAL LOW (ref 13.0–17.0)
MCH: 26.6 pg (ref 26.0–34.0)
MCHC: 31 g/dL (ref 30.0–36.0)
MCV: 85.6 fL (ref 78.0–100.0)
PLATELETS: 248 10*3/uL (ref 150–400)
RBC: 3.2 MIL/uL — ABNORMAL LOW (ref 4.22–5.81)
RDW: 15.1 % (ref 11.5–15.5)
WBC: 10.5 10*3/uL (ref 4.0–10.5)

## 2014-09-19 LAB — BASIC METABOLIC PANEL
Anion gap: 6 (ref 5–15)
BUN: 19 mg/dL (ref 6–23)
CHLORIDE: 113 mmol/L — AB (ref 96–112)
CO2: 24 mmol/L (ref 19–32)
Calcium: 7.6 mg/dL — ABNORMAL LOW (ref 8.4–10.5)
Creatinine, Ser: 1.73 mg/dL — ABNORMAL HIGH (ref 0.50–1.35)
GFR, EST AFRICAN AMERICAN: 49 mL/min — AB (ref >90)
GFR, EST NON AFRICAN AMERICAN: 42 mL/min — AB (ref >90)
GLUCOSE: 118 mg/dL — AB (ref 70–99)
POTASSIUM: 3.7 mmol/L (ref 3.5–5.1)
Sodium: 143 mmol/L (ref 135–145)

## 2014-09-19 LAB — LEGIONELLA ANTIGEN, URINE

## 2014-09-19 MED ORDER — CETYLPYRIDINIUM CHLORIDE 0.05 % MT LIQD
7.0000 mL | Freq: Two times a day (BID) | OROMUCOSAL | Status: DC
  Administered 2014-09-19 – 2014-10-01 (×21): 7 mL via OROMUCOSAL

## 2014-09-19 MED ORDER — WARFARIN SODIUM 1 MG PO TABS
1.0000 mg | ORAL_TABLET | Freq: Once | ORAL | Status: AC
  Administered 2014-09-19: 1 mg via ORAL
  Filled 2014-09-19 (×2): qty 1

## 2014-09-19 MED ORDER — GI COCKTAIL ~~LOC~~
30.0000 mL | Freq: Once | ORAL | Status: AC
  Administered 2014-09-19: 30 mL via ORAL
  Filled 2014-09-19: qty 30

## 2014-09-19 MED ORDER — GI COCKTAIL ~~LOC~~
30.0000 mL | Freq: Three times a day (TID) | ORAL | Status: DC | PRN
  Administered 2014-09-19 – 2014-09-30 (×6): 30 mL via ORAL
  Filled 2014-09-19 (×8): qty 30

## 2014-09-19 NOTE — Progress Notes (Signed)
Report given to Katie RN on [address] [place] pt will be transferred by bed to  room 21.
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
## 2014-09-19 NOTE — Progress Notes (Signed)
[facility] PROGRESS NOTE  Samuel [name] TDV:[id number] DOB: 04/27/1956 DOA: 09/17/2014 PCP: [name], MD  Interim Summary Patient is a pleasant 59 year old woman with past medical history paraplegia, neurogenic bladder, chronic indwelling Foley catheter, status post left lower extremity amputation, presented as a transfer from his skilled nursing facility to the emergency department on 09/17/2014 with complaints of increasing cough and shortness of breath. Workup included a chest x-ray which revealed left lower lobe infiltrate. Initial lab showed white count of 13,600 which trended up to 20,000 on the following day. He was started on broad-spectrum IV antimicrobial therapy with meropenem and vancomycin for presumed healthcare associated pneumonia. Patient was also found to have acute kidney injury presenting with a creatinine 1.92. Previous lab work on 08/25/2014 showed a creatinine 1.0. He was started on IV fluids. Cultures are pending at the time of this dictation. Lab work drawn on 09/19/2014 showed overall improvement with his white count trending down to 10.2, creatinine trended down to 1.7 from 1.9. Renal ultrasound showed the presence of bilateral hydronephrosis. He also continues to have blood-tinged urine, plan to further workup with CT scan of abdomen and pelvis. Of note he had a supratherapeutic INR of 3.75 this morning, pharmacy managing warfarin.  Assessment/Plan: 1. Healthcare associated pneumonia -Patient is a nursing home resident presented as a transfer yesterday with clinical signs symptoms consistent with pneumonia. Chest x-ray which showed a left lower lobe infiltrate. -Pending blood cultures -Continue broad-spectrum IV antimicrobial therapy with meropenem and vancomycin -Repeat chest x-ray in a.m. -Overall clinically improved on today's evaluation, maintains afebrile, hemodynamically stable, tolerating by mouth intake. Plan to transfer out of stepdown unit to  telemetry.  2.   Sepsis -Present on admission, evidenced by a white count of 20,000, development of acute kidney injury, heart rate of 113, respiratory to 28. -Source of infection likely healthcare associated pneumonia, continue broad-spectrum IV antimicrobial therapy with vancomycin and meropenem -Urinalysis did show the presence of many bacteria and large leukocytes, although patient does have history of chronic Foley catheter -Follow-up on cultures.  3.  Acute kidney injury. -Patient presenting with an elevated creatinine of 1.9, having a normal kidney function on 08/25/2014 -Likely secondary to critical illness, sepsis and hypovolemia. -Patient bolused with normal saline this morning, continue running maintenance fluids -Repeat BMP this morning showing a downward trend in his creatinine from 1.9-1.7. Patient appear to have increased right lower extremity edema along with increasing bibasilar crackles. I'm concerned with the possibility of fluid overload. We'll stop IV fluids today, reassess kidney function in a.m.  4.  Hematuria/bilateral hydronephrosis. -Patient on chronic anticoagulation presented with hematuria initially felt to be secondary to recent Foley catheter exchange. -Renal ultrasound showed the presence of bilateral hydronephrosis, will further workup with CT scan of abdomen and pelvis without contrast to assess for the possibility of stones, although he appear to have benign abdominal exam.  4.  Question urinary tract infection. -Urinalysis performed on admission showed the presence of bacteria along with white blood cells. It's unclear if this represents an active infection as he has a history of chronic Foley catheter placement. He is currently on meropenem therapy for healthcare associated pneumonia, which would also cover gram negatives  5.  History of pulmonary embolism. -Continue chronic anticoagulation with warfarin, having a supratherapeutic INR of 3.75 -Pharmacy  consulted for warfarin management    Code Status: Full code Family Communication:  Disposition Plan: Continue broad-spectrum IV antimicrobial therapy, will transfer patient to telemetry   Consultants:  Pharmacy  Antibiotics:  Vancomycin (started on 09/17/2014)  Meropenem (started on 09/17/2014)  HPI/Subjective:   Objective: Filed Vitals:   09/19/14 0825  BP: 143/89  Pulse: 69  Temp: 98.2 F (36.8 C)  Resp: 15    Intake/Output Summary (Last 24 hours) at 09/19/14 0840 Last data filed at 09/19/14 0700  Gross per 24 hour  Intake   3700 ml  Output   2825 ml  Net    875 ml   Filed Weights   09/17/14 2000  Weight: 116.4 kg (256 lb 9.9 oz)    Exam:   General: He appears better today, awake and alert oriented in no acute distress  Cardiovascular: Tachycardic, regular rate rhythm normal S1-S2  Respiratory: Patient having coarse respiratory sounds, positive rhonchi, bibasilar crackles, no wheezing  Abdomen: Obese, soft nontender nondistended, status post ostomy placement  Musculoskeletal: Status post left above-the-knee amputation, he has a stage II decubitus ulcer present as well. There is increasing edema to his right lower extremity compared to yesterday's exam.   Genitourinary. There may be increasing scrotal edema compared with exam  Data Reviewed: Basic Metabolic Panel:  Recent Labs Lab 09/17/14 1651 09/17/14 1705 09/18/14 0309 09/19/14 0520  NA 144 143 143 143  K 3.3* 3.3* 3.5 3.7  CL 108 104 111 113*  CO2 30  --  25 24  GLUCOSE 124* 123* 111* 118*  BUN 17 19 17 19   CREATININE 1.92* 1.80* 1.97* 1.73*  CALCIUM 9.0  --  7.8* 7.6*   Liver Function Tests:  Recent Labs Lab 09/17/14 1651 09/18/14 0309  AST 17 15  ALT 15 13  ALKPHOS 67 51  BILITOT 0.5 0.5  PROT 6.5 5.3*  ALBUMIN 3.3* 2.6*   No results for input(s): LIPASE, AMYLASE in the last 168 hours. No results for input(s): AMMONIA in the last 168 hours. CBC:  Recent Labs Lab  09/17/14 1651 09/17/14 1705 09/18/14 0309 09/19/14 0520  WBC 13.6*  --  20.1* 10.5  NEUTROABS 11.7*  --   --   --   HGB 10.9* 12.2* 9.3* 8.5*  HCT 34.6* 36.0* 29.7* 27.4*  MCV 83.2  --  85.1 85.6  PLT 271  --  269 248   Cardiac Enzymes: No results for input(s): CKTOTAL, CKMB, CKMBINDEX, TROPONINI in the last 168 hours. BNP (last 3 results)  Recent Labs  09/17/14 1651  BNP 41.0    ProBNP (last 3 results)  Recent Labs  09/19/13 1415 12/07/13 2221  PROBNP 123.8 56.9    CBG: No results for input(s): GLUCAP in the last 168 hours.  Recent Results (from the past 240 hour(s))  Urine culture     Status: None (Preliminary result)   Collection Time: 09/17/14  4:29 PM  Result Value Ref Range Status   Specimen Description URINE, CATHETERIZED  Final   Special Requests NONE  Final   Colony Count   Final    >=100,000 COLONIES/ML Performed at [facility]    Culture   Final    [place] Performed at [facility]    Report Status PENDING  Incomplete  Blood Culture (routine x 2)     Status: None (Preliminary result)   Collection Time: 09/17/14  4:40 PM  Result Value Ref Range Status   Specimen Description BLOOD RIGHT ARM  Final   Special Requests BOTTLES DRAWN AEROBIC AND ANAEROBIC 10CC  Final   Culture   Final           BLOOD CULTURE RECEIVED  NO GROWTH TO DATE CULTURE WILL BE HELD FOR 5 DAYS BEFORE ISSUING A FINAL NEGATIVE REPORT Performed at [facility]    Report Status PENDING  Incomplete  Blood Culture (routine x 2)     Status: None (Preliminary result)   Collection Time: 09/17/14  4:55 PM  Result Value Ref Range Status   Specimen Description BLOOD LEFT HAND  Final   Special Requests BOTTLES DRAWN AEROBIC ONLY 5CC  Final   Culture   Final           BLOOD CULTURE RECEIVED NO GROWTH TO DATE CULTURE WILL BE HELD FOR 5 DAYS BEFORE ISSUING A FINAL NEGATIVE REPORT Performed at [facility]    Report Status PENDING  Incomplete   MRSA PCR Screening     Status: Abnormal   Collection Time: 09/17/14  8:10 PM  Result Value Ref Range Status   MRSA by PCR POSITIVE (A) NEGATIVE Final    Comment:        The GeneXpert MRSA Assay (FDA approved for NASAL specimens only), is one component of a comprehensive MRSA colonization surveillance program. It is not intended to diagnose MRSA infection nor to guide or monitor treatment for MRSA infections. RESULT CALLED TO, READ BACK BY AND VERIFIED WITH: PETTIFORD,A RN [phone] 09/18/14 MITCHELL,L      Studies: US Renal  09/18/2014   CLINICAL DATA:  Acute renal failure  EXAM: RENAL/URINARY TRACT ULTRASOUND COMPLETE  COMPARISON:  CT, 08/30/2014  FINDINGS: Right Kidney:  Length: 13.2 cm. Normal parenchymal echogenicity. No mass or stone. Moderate hydronephrosis.  Left Kidney:  Length: 13.2 cm. Normal parenchymal echogenicity. No mass or stone. Mild to moderate hydronephrosis.  Bladder:  Decompressed by a Foley catheter.  IMPRESSION: 1. Bilateral hydronephrosis, moderate on the right and mild to moderate on the left. Hydronephrosis on the left appears increased when compared to the prior CT. That on the right is similar.   Electronically Signed   By: Lajean Manes M.D.   On: 09/18/2014 17:14   Dg Chest Port 1 View  09/17/2014   CLINICAL DATA:  Initial evaluation for shortness of breath  EXAM: PORTABLE CHEST - 1 VIEW  COMPARISON:  06/28/2014  FINDINGS: Very limited inspiratory effect. Right lung clear except for mild atelectasis at the base. The retrocardiac left lower lobe is difficult to evaluate. The left upper lobe is clear.  IMPRESSION: Limited study due to very limited inspiratory effect. Retrocardiac left lower lobe difficult to evaluate but left lower lobe infiltrate suspected.   Electronically Signed   By: Skipper Cliche M.D.   On: 09/17/2014 16:30    Scheduled Meds: . atorvastatin  10 mg Oral QHS  . baclofen  20 mg Oral 3 times per day  . Chlorhexidine Gluconate Cloth  6 each  Topical Q0600  . docusate sodium  100 mg Oral BID  . DULoxetine  60 mg Oral QHS  . fentaNYL  50 mcg Transdermal Q72H  . lamoTRIgine  200 mg Oral BID  . levothyroxine  125 mcg Oral QAC breakfast  . meropenem (MERREM) IV  1 g Intravenous Q12H  . metoCLOPramide  5 mg Oral TID AC & HS  . mupirocin ointment  1 application Nasal BID  . oxybutynin  5 mg Oral Daily  . pantoprazole  40 mg Oral BID  . predniSONE  10 mg Oral Q breakfast  . pregabalin  75 mg Oral BID  . QUEtiapine  100 mg Oral BID  . saccharomyces boulardii  250 mg  Oral BID  . sodium chloride  3 mL Intravenous Q12H  . vancomycin  1,250 mg Intravenous Q24H  . warfarin  4.5 mg Oral Once per day on Sun Tue Thu  . Warfarin - Pharmacist Dosing Inpatient   Does not apply q1800   Continuous Infusions:   Principal Problem:   HCAP (healthcare-associated pneumonia) Active Problems:   Chronic pain   Sepsis   UTI (lower urinary tract infection)   ARF (acute renal failure)   Somnolence   Hematuria   Acute respiratory failure with hypoxia    Time spent: 40 minutes    Kelvin Cellar  [facility] Pager [phone]. If 7PM-7AM, please contact night-coverage at [email or web address], password [facility] 09/19/2014, 8:40 AM  LOS: 2 days

## 2014-09-19 NOTE — Progress Notes (Signed)
Pt transferred to [facility] room 21 per bed with all belongings. Accompanied by RN and Nurse tech

## 2014-09-19 NOTE — Clinical Documentation Improvement (Addendum)
"  Stage II Decubitus Ulcer" documented in the physician progress notes 09/18/14 and 09/19/14.  Please document the location of the ulcer and whether or not Present on Admission.   Thank You, Erling Conte ,RN Clinical Documentation Specialist:  [phone] [place] [employer]
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
## 2014-09-20 ENCOUNTER — Inpatient Hospital Stay (HOSPITAL_COMMUNITY): Payer: Medicare Other

## 2014-09-20 LAB — CBC
HCT: 28.1 % — ABNORMAL LOW (ref 39.0–52.0)
Hemoglobin: 9 g/dL — ABNORMAL LOW (ref 13.0–17.0)
MCH: 26.4 pg (ref 26.0–34.0)
MCHC: 32 g/dL (ref 30.0–36.0)
MCV: 82.4 fL (ref 78.0–100.0)
PLATELETS: 268 10*3/uL (ref 150–400)
RBC: 3.41 MIL/uL — ABNORMAL LOW (ref 4.22–5.81)
RDW: 15 % (ref 11.5–15.5)
WBC: 11.3 10*3/uL — ABNORMAL HIGH (ref 4.0–10.5)

## 2014-09-20 LAB — BASIC METABOLIC PANEL
Anion gap: 4 — ABNORMAL LOW (ref 5–15)
BUN: 14 mg/dL (ref 6–23)
CALCIUM: 8.1 mg/dL — AB (ref 8.4–10.5)
CO2: 25 mmol/L (ref 19–32)
Chloride: 112 mmol/L (ref 96–112)
Creatinine, Ser: 1.61 mg/dL — ABNORMAL HIGH (ref 0.50–1.35)
GFR calc Af Amer: 53 mL/min — ABNORMAL LOW (ref >90)
GFR, EST NON AFRICAN AMERICAN: 46 mL/min — AB (ref >90)
Glucose, Bld: 96 mg/dL (ref 70–99)
Potassium: 3.3 mmol/L — ABNORMAL LOW (ref 3.5–5.1)
Sodium: 141 mmol/L (ref 135–145)

## 2014-09-20 LAB — PROTIME-INR
INR: 3.2 — ABNORMAL HIGH (ref 0.00–1.49)
Prothrombin Time: 33 seconds — ABNORMAL HIGH (ref 11.6–15.2)

## 2014-09-20 MED ORDER — WARFARIN SODIUM 3 MG PO TABS
3.0000 mg | ORAL_TABLET | Freq: Once | ORAL | Status: AC
  Administered 2014-09-20: 3 mg via ORAL
  Filled 2014-09-20: qty 1

## 2014-09-20 MED ORDER — POTASSIUM CHLORIDE CRYS ER 20 MEQ PO TBCR
40.0000 meq | EXTENDED_RELEASE_TABLET | Freq: Once | ORAL | Status: AC
  Administered 2014-09-20: 40 meq via ORAL
  Filled 2014-09-20: qty 2

## 2014-09-20 NOTE — Progress Notes (Signed)
[facility] PROGRESS NOTE  Samuel [name] YOV:[id number] DOB: 25-Aug-1955 DOA: 09/17/2014 PCP: [name], MD  Interim Summary Patient is a pleasant 59 year old male with past medical history paraplegia, neurogenic bladder, chronic indwelling Foley catheter, status post left lower extremity amputation, presented as a transfer from his skilled nursing facility to the emergency department on 09/17/2014 with complaints of increasing cough and shortness of breath. Workup included a chest x-ray which revealed left lower lobe infiltrate. Initial lab showed white count of 13,600 which trended up to 20,000 on the following day. He was started on broad-spectrum IV antimicrobial therapy with meropenem and vancomycin for presumed healthcare associated pneumonia. Patient was also found to have acute kidney injury presenting with a creatinine 1.92. Previous lab work on 08/25/2014 showed a creatinine 1.0. He was started on IV fluids. Cultures are pending at the time of this dictation. Lab work drawn on 09/19/2014 showed overall improvement with his white count trending down to 10.2, creatinine trended down to 1.7 from 1.9. Renal ultrasound showed the presence of bilateral hydronephrosis. Patient was continuing to have hematuria.  Assessment/Plan: 1. Healthcare associated pneumonia/ left lower lobe infiltrates -Continue broad-spectrum IV antimicrobial therapy with meropenem and vancomycin -Blood cultures negative so far, UA shows more than 100,000 colonies of gram-negative rods.   2.   Sepsis likely secondary to healthcare associated pneumonia and UTI -Continue broad-spectrum IV antimicrobial therapy with vancomycin and meropenem -Patient has a history of chronic Foley catheter, follow urine culture and sensitivities  3.  Acute kidney injury. -Patient presenting with an elevated creatinine of 1.9, having a normal kidney function on 08/25/2014 -Likely secondary to critical illness, sepsis and  hypovolemia and UTI. -Creatinine function is improving, 1.6 today.   4.  Hematuria/bilateral hydronephrosis possibly due to bladder wall thickening and ?tumor -Patient on chronic anticoagulation presented with hematuria initially felt to be secondary to recent Foley catheter exchange. -Renal ultrasound showed the presence of bilateral hydronephrosis, CT scan of the abdomen and pelvis showed moderate to severe left hydronephrosis with bladder wall thickening and possible tumor. - Urology consulted, discussed with Dr McDiarmid, will follow  5.  History of pulmonary embolism. -Continue chronic anticoagulation with warfarin, having a supratherapeutic INR  -Pharmacy consulted for warfarin management  6. Hypokalemia- replaced   Code Status: Full code  Family Communication:   Disposition Plan: Back to skilled nursing facility once medically stable   Consultants:  Pharmacy  Antibiotics:  Vancomycin (started on 09/17/2014)  Meropenem (started on 09/17/2014)  Subjective:  Patient seen and examined, continues to have hematuria, otherwise no complaints at this time, afebrile, no coughing, nausea or vomiting.   Objective:  BP 154/85 mmHg  Pulse 86  Temp(Src) 98 F (36.7 C) (Oral)  Resp 20  Ht 5\' 5"  (1.651 m)  Wt 116.4 kg (256 lb 9.9 oz)  BMI 42.70 kg/m2  SpO2 97%   Intake/Output Summary (Last 24 hours) at 09/20/14 1128 Last data filed at 09/20/14 1042  Gross per 24 hour  Intake   1720 ml  Output   2675 ml  Net   -955 ml   Filed Weights   09/17/14 2000  Weight: 116.4 kg (256 lb 9.9 oz)    Exam:   General: Alert and oriented 4, NAD, pleasant and talkative   CVS: Regular rate and rhythm, normal S1 and S2.   Resp: Decreased breath sound at the bases otherwise no wheezing  Abdomen: Obese, soft nontender nondistended, status post ostomy placement  Extremities: Status post left AKA  Data Reviewed: Basic Metabolic Panel:  Recent Labs Lab 09/17/14 1651  09/17/14 1705 09/18/14 0309 09/19/14 0520 09/20/14 0602  NA 144 143 143 143 141  K 3.3* 3.3* 3.5 3.7 3.3*  CL 108 104 111 113* 112  CO2 30  --  25 24 25   GLUCOSE 124* 123* 111* 118* 96  BUN 17 19 17 19 14   CREATININE 1.92* 1.80* 1.97* 1.73* 1.61*  CALCIUM 9.0  --  7.8* 7.6* 8.1*   Liver Function Tests:  Recent Labs Lab 09/17/14 1651 09/18/14 0309  AST 17 15  ALT 15 13  ALKPHOS 67 51  BILITOT 0.5 0.5  PROT 6.5 5.3*  ALBUMIN 3.3* 2.6*   No results for input(s): LIPASE, AMYLASE in the last 168 hours. No results for input(s): AMMONIA in the last 168 hours. CBC:  Recent Labs Lab 09/17/14 1651 09/17/14 1705 09/18/14 0309 09/19/14 0520 09/20/14 0602  WBC 13.6*  --  20.1* 10.5 11.3*  NEUTROABS 11.7*  --   --   --   --   HGB 10.9* 12.2* 9.3* 8.5* 9.0*  HCT 34.6* 36.0* 29.7* 27.4* 28.1*  MCV 83.2  --  85.1 85.6 82.4  PLT 271  --  269 248 268   Cardiac Enzymes: No results for input(s): CKTOTAL, CKMB, CKMBINDEX, TROPONINI in the last 168 hours. BNP (last 3 results)  Recent Labs  09/17/14 1651  BNP 41.0    ProBNP (last 3 results)  Recent Labs  12/07/13 2221  PROBNP 56.9    CBG: No results for input(s): GLUCAP in the last 168 hours.  Recent Results (from the past 240 hour(s))  Urine culture     Status: None (Preliminary result)   Collection Time: 09/17/14  4:29 PM  Result Value Ref Range Status   Specimen Description URINE, CATHETERIZED  Final   Special Requests NONE  Final   Colony Count   Final    >=100,000 COLONIES/ML Performed at [facility]    Culture   Final    [place] Performed at [facility]    Report Status PENDING  Incomplete  Blood Culture (routine x 2)     Status: None (Preliminary result)   Collection Time: 09/17/14  4:40 PM  Result Value Ref Range Status   Specimen Description BLOOD RIGHT ARM  Final   Special Requests BOTTLES DRAWN AEROBIC AND ANAEROBIC 10CC  Final   Culture   Final           BLOOD  CULTURE RECEIVED NO GROWTH TO DATE CULTURE WILL BE HELD FOR 5 DAYS BEFORE ISSUING A FINAL NEGATIVE REPORT Performed at [facility]    Report Status PENDING  Incomplete  Blood Culture (routine x 2)     Status: None (Preliminary result)   Collection Time: 09/17/14  4:55 PM  Result Value Ref Range Status   Specimen Description BLOOD LEFT HAND  Final   Special Requests BOTTLES DRAWN AEROBIC ONLY 5CC  Final   Culture   Final           BLOOD CULTURE RECEIVED NO GROWTH TO DATE CULTURE WILL BE HELD FOR 5 DAYS BEFORE ISSUING A FINAL NEGATIVE REPORT Performed at [facility]    Report Status PENDING  Incomplete  MRSA PCR Screening     Status: Abnormal   Collection Time: 09/17/14  8:10 PM  Result Value Ref Range Status   MRSA by PCR POSITIVE (A) NEGATIVE Final    Comment:  The GeneXpert MRSA Assay (FDA approved for NASAL specimens only), is one component of a comprehensive MRSA colonization surveillance program. It is not intended to diagnose MRSA infection nor to guide or monitor treatment for MRSA infections. RESULT CALLED TO, READ BACK BY AND VERIFIED WITH: PETTIFORD,A RN [phone] 09/18/14 MITCHELL,L      Studies: Ct Abdomen Pelvis Wo Contrast  09/19/2014   CLINICAL DATA:  59 year old male with acute kidney injury, bilateral hydronephrosis on recent renal ultrasound, hematuria and elevated white count. 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  EXAM: CT ABDOMEN AND PELVIS WITHOUT CONTRAST  TECHNIQUE: Multidetector CT imaging of the abdomen and pelvis was performed following the standard protocol without IV contrast.  COMPARISON:  09/18/2014 ultrasound. 08/30/2014 abdominal-pelvic CT from [facility]. 07/05/2014 chest CT.  FINDINGS: Please note that parenchymal abnormalities may be missed without intravenous contrast.  Lower chest: Right basilar airspace disease and atelectasis have increased since 08/30/2014. Increasing left basilar atelectasis is noted.  There are tiny bilateral pleural effusions present.  Hepatobiliary: No hepatic abnormalities are identified. Cholelithiasis again noted without CT evidence of acute cholecystitis. There is no evidence of biliary dilatation.  Pancreas: Unremarkable  Spleen: Unremarkable  Adrenals/Urinary Tract: Moderate to severe bilateral hydronephrosis to the bladder again noted, stable on the right and increased on the left since 08/30/2014. A Foley catheter within a collapsed bladder is noted but again noted is abnormal increased density within the bladder versus bladder wall thickening, left-greater-than-right.  A nonobstructing 6 mm mid left renal calculus is noted.  The adrenal glands are unremarkable.  Stomach/Bowel: Prior PEG tube changes again identified. There is no evidence of bowel obstruction or pneumoperitoneum. Left colostomy changes again noted.  Vascular/Lymphatic: Enlarged iliac lymph nodes are unchanged with an index right external iliac lymph node again measuring 2.8 cm (image 64). No new enlarged lymph nodes are identified.  Reproductive: The prostate gland is enlarged.  Other: No evidence of free fluid.  Musculoskeletal: Chronic left hemipelvis bony deformities are again noted including absence of the proximal left femur, chronic right femoral head destruction, flattening and dysplasia of both acetabula, probable right hip effusion and lower sacral/ coccygeal deformity. Prior lumbar spine fusion, thoracolumbar posterior decompression and probable intraspinal pain catheter again noted.  IMPRESSION: Unchanged abnormal increased density within the bladder/bladder wall thickening suspicious for tumor. Moderate to severe bilateral hydronephrosis again noted, stable on the right and increased on the left since 08/30/2014, likely related to tumor obstruction.  Unchanged iliac lymphadenopathy, worrisome for metastatic disease.  Increased airspace disease and atelectasis at the right lung base with aspiration or  pneumonia considerations. Increased left basilar atelectasis and new tiny bilateral pleural effusions.  Chronic bony changes within the pelvis.  Cholelithiasis without CT evidence of acute cholecystitis.  Prostatomegaly.   Electronically Signed   By: Margarette Canada M.D.   On: 09/19/2014 11:25   US Renal  09/18/2014   CLINICAL DATA:  Acute renal failure  EXAM: RENAL/URINARY TRACT ULTRASOUND COMPLETE  COMPARISON:  CT, 08/30/2014  FINDINGS: Right Kidney:  Length: 13.2 cm. Normal parenchymal echogenicity. No mass or stone. Moderate hydronephrosis.  Left Kidney:  Length: 13.2 cm. Normal parenchymal echogenicity. No mass or stone. Mild to moderate hydronephrosis.  Bladder:  Decompressed by a Foley catheter.  IMPRESSION: 1. Bilateral hydronephrosis, moderate on the right and mild to moderate on the left. Hydronephrosis on the left appears increased when compared to the prior CT. That on the right is similar.   Electronically Signed   By: Shanon Brow  Ormond M.D.   On: 09/18/2014 17:14    Scheduled Meds: . antiseptic oral rinse  7 mL Mouth Rinse BID  . atorvastatin  10 mg Oral QHS  . baclofen  20 mg Oral 3 times per day  . Chlorhexidine Gluconate Cloth  6 each Topical Q0600  . docusate sodium  100 mg Oral BID  . DULoxetine  60 mg Oral QHS  . fentaNYL  50 mcg Transdermal Q72H  . lamoTRIgine  200 mg Oral BID  . levothyroxine  125 mcg Oral QAC breakfast  . meropenem (MERREM) IV  1 g Intravenous Q12H  . metoCLOPramide  5 mg Oral TID AC & HS  . mupirocin ointment  1 application Nasal BID  . oxybutynin  5 mg Oral Daily  . pantoprazole  40 mg Oral BID  . predniSONE  10 mg Oral Q breakfast  . pregabalin  75 mg Oral BID  . QUEtiapine  100 mg Oral BID  . saccharomyces boulardii  250 mg Oral BID  . sodium chloride  3 mL Intravenous Q12H  . vancomycin  1,250 mg Intravenous Q24H  . warfarin  3 mg Oral ONCE-1800  . Warfarin - Pharmacist Dosing Inpatient   Does not apply q71    Samuel [name] M.D. [facility] 09/20/2014, 11:37 AM  Pager: [phone]

## 2014-09-20 NOTE — Progress Notes (Signed)
ANTICOAGULATION & ANTIBIOTIC CONSULT NOTE - Follow Up Consult  Pharmacy Consult:  Coumadin + Vancomycin / Merrem Indication:  History of PE + PNA  Allergies  Allergen Reactions  . Tomato Other (See Comments)    Unknown reaction    Patient Measurements: Height: 5\' 5"  (165.1 cm) Weight: 256 lb 9.9 oz (116.4 kg) IBW/kg (Calculated) : 61.5  Vital Signs: Temp: 98 F (36.7 C) (02/24 0609) Temp Source: Oral (02/24 0609) BP: 154/85 mmHg (02/24 0609) Pulse Rate: 86 (02/24 0609)  Labs:  Recent Labs  09/18/14 0309 09/19/14 0520 09/20/14 0602  HGB 9.3* 8.5* 9.0*  HCT 29.7* 27.4* 28.1*  PLT 269 248 268  LABPROT 30.8* 37.3* 33.0*  INR 2.93* 3.75* 3.20*  CREATININE 1.97* 1.73* 1.61*    Estimated Creatinine Clearance: 59.8 mL/min (by C-G formula based on Cr of 1.61).     Assessment: 61 YOM with paraplegia and neurogenic bladder with chronic indwelling foley cath.He presented to [facility] from nursing facility with complaints of cough and SOB.  Patient to continue on vancomycin and Primaxin for HCAP.  His renal function is improving.  Vanc 2/21 >> Zosyn 2/21 >> 2/21 Primaxin 2/21 >>  2/21 BCx x2 - NGTD 2/21 UCx - GNR 2/21 MRSA PCR - positive   Patient also continues on Coumadin for history of PE.  His INR remains elevated but has trended down significantly.  No bleeding reported.      Goal of Therapy:  INR 2-3 Vanc trough 15-20 mcg/mL    Plan:  - Coumadin 3mg  PO today - Daily PT / INR - Continue Merrem 1gm IV Q12H and Vanc 1250mg  IV Q24H - Monitor renal fxn, micro data, vanc trough tomorrow if therapy to continue - F/U KCL supplementation    Taylah Dubiel D. Mina Marble, PharmD, BCPS Pager:  [phone] 09/20/2014, 8:40 AM
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
## 2014-09-21 ENCOUNTER — Other Ambulatory Visit: Payer: Self-pay | Admitting: Urology

## 2014-09-21 LAB — PROTIME-INR
INR: 3.23 — AB (ref 0.00–1.49)
PROTHROMBIN TIME: 33.2 s — AB (ref 11.6–15.2)

## 2014-09-21 LAB — BASIC METABOLIC PANEL
Anion gap: 9 (ref 5–15)
BUN: 19 mg/dL (ref 6–23)
CALCIUM: 8.6 mg/dL (ref 8.4–10.5)
CHLORIDE: 108 mmol/L (ref 96–112)
CO2: 26 mmol/L (ref 19–32)
CREATININE: 1.87 mg/dL — AB (ref 0.50–1.35)
GFR calc non Af Amer: 38 mL/min — ABNORMAL LOW (ref >90)
GFR, EST AFRICAN AMERICAN: 44 mL/min — AB (ref >90)
Glucose, Bld: 95 mg/dL (ref 70–99)
POTASSIUM: 3.2 mmol/L — AB (ref 3.5–5.1)
Sodium: 143 mmol/L (ref 135–145)

## 2014-09-21 LAB — CBC
HCT: 27.5 % — ABNORMAL LOW (ref 39.0–52.0)
Hemoglobin: 8.8 g/dL — ABNORMAL LOW (ref 13.0–17.0)
MCH: 26.7 pg (ref 26.0–34.0)
MCHC: 32 g/dL (ref 30.0–36.0)
MCV: 83.3 fL (ref 78.0–100.0)
Platelets: 232 10*3/uL (ref 150–400)
RBC: 3.3 MIL/uL — ABNORMAL LOW (ref 4.22–5.81)
RDW: 15 % (ref 11.5–15.5)
WBC: 10.3 10*3/uL (ref 4.0–10.5)

## 2014-09-21 MED ORDER — POTASSIUM CHLORIDE CRYS ER 20 MEQ PO TBCR
40.0000 meq | EXTENDED_RELEASE_TABLET | ORAL | Status: AC
  Administered 2014-09-21 (×2): 40 meq via ORAL
  Filled 2014-09-21 (×2): qty 2

## 2014-09-21 MED ORDER — SODIUM CHLORIDE 0.9 % IV SOLN
INTRAVENOUS | Status: DC
  Administered 2014-09-21 – 2014-09-23 (×4): via INTRAVENOUS

## 2014-09-21 NOTE — Care Management Note (Unsigned)
    Page 1 of 1   09/21/2014     12:26:52 PM CARE MANAGEMENT NOTE 09/21/2014  Patient:  Samuel [name], Samuel [name]   Account Number:  [id number]  Date Initiated:  09/21/2014  Documentation initiated by:  Tomi Bamberger  Subjective/Objective Assessment:   dx hcap, sepsis, arf  admit- from [facility].     Action/Plan:   Anticipated DC Date:  09/22/2014   Anticipated DC Plan:  SKILLED NURSING FACILITY  In-house referral  Clinical Social Worker      DC Planning Services  CM consult      Choice offered to / List presented to:             Status of service:  In process, will continue to follow Medicare Important Message given?  YES (If response is "NO", the following Medicare IM given date fields will be blank) Date Medicare IM given:  09/21/2014 Medicare IM given by:  Tomi Bamberger Date Additional Medicare IM given:   Additional Medicare IM given by:    Discharge Disposition:    Per UR Regulation:  Reviewed for med. necessity/level of care/duration of stay  If discussed at [place] of Stay Meetings, dates discussed:    Comments:  09/21/14 [place] BSN [phone] patient is from [facility], CSW following.

## 2014-09-21 NOTE — Progress Notes (Signed)
Subjective: Pt feeling better today. No SOB.   Objective: Vital signs in last 24 hours: Temp:  [97.6 F (36.4 C)-99.1 F (37.3 C)] 97.6 F (36.4 C) (02/25 0626) Pulse Rate:  [79-80] 80 (02/25 0626) Resp:  [18-20] 20 (02/25 0626) BP: (164-182)/(88-99) 182/88 mmHg (02/25 0626) SpO2:  [98 %-100 %] 98 % (02/25 0626)  Intake/Output from previous day: 02/24 0701 - 02/25 0700 In: 1150 [P.O.:900; IV Piggyback:250] Out: 6350 [Urine:6350] Intake/Output this shift:    Physical Exam:  NAD Foley in place, 18Fr. About 1400 ml clear urine in bag.   Lab Results:  Recent Labs  09/19/14 0520 09/20/14 0602 09/21/14 0500  HGB 8.5* 9.0* 8.8*  HCT 27.4* 28.1* 27.5*   BMET  Recent Labs  09/20/14 0602 09/21/14 0500  NA 141 143  K 3.3* 3.2*  CL 112 108  CO2 25 26  GLUCOSE 96 95  BUN 14 19  CREATININE 1.61* 1.87*  CALCIUM 8.1* 8.6    Recent Labs  09/19/14 0520 09/20/14 0602 09/21/14 0500  INR 3.75* 3.20* 3.23*   No results for input(s): LABURIN in the last 72 hours. Results for orders placed or performed during the hospital encounter of 09/17/14  Urine culture     Status: None (Preliminary result)   Collection Time: 09/17/14  4:29 PM  Result Value Ref Range Status   Specimen Description URINE, CATHETERIZED  Final   Special Requests NONE  Final   Colony Count   Final    >=100,000 COLONIES/ML Performed at [facility]    Culture   Final    [place] Performed at [facility]    Report Status PENDING  Incomplete  Blood Culture (routine x 2)     Status: None (Preliminary result)   Collection Time: 09/17/14  4:40 PM  Result Value Ref Range Status   Specimen Description BLOOD RIGHT ARM  Final   Special Requests BOTTLES DRAWN AEROBIC AND ANAEROBIC 10CC  Final   Culture   Final           BLOOD CULTURE RECEIVED NO GROWTH TO DATE CULTURE WILL BE HELD FOR 5 DAYS BEFORE ISSUING A FINAL NEGATIVE REPORT Performed at [facility]    Report  Status PENDING  Incomplete  Blood Culture (routine x 2)     Status: None (Preliminary result)   Collection Time: 09/17/14  4:55 PM  Result Value Ref Range Status   Specimen Description BLOOD LEFT HAND  Final   Special Requests BOTTLES DRAWN AEROBIC ONLY 5CC  Final   Culture   Final           BLOOD CULTURE RECEIVED NO GROWTH TO DATE CULTURE WILL BE HELD FOR 5 DAYS BEFORE ISSUING A FINAL NEGATIVE REPORT Performed at [facility]    Report Status PENDING  Incomplete  MRSA PCR Screening     Status: Abnormal   Collection Time: 09/17/14  8:10 PM  Result Value Ref Range Status   MRSA by PCR POSITIVE (A) NEGATIVE Final    Comment:        The GeneXpert MRSA Assay (FDA approved for NASAL specimens only), is one component of a comprehensive MRSA colonization surveillance program. It is not intended to diagnose MRSA infection nor to guide or monitor treatment for MRSA infections. RESULT CALLED TO, READ BACK BY AND VERIFIED WITH: PETTIFORD,A RN [id number] 09/18/14 MITCHELL,L     Studies/Results: Renal u/s - right > left hydronephrosis  CT - bladder mass, iliac LAD, R >  L hydronephrosis  I reviewed studies.   Assessment/Plan: 1) ARF, bilateral hydronephrosis - likely medical and from NG bladder, bladder tumor causing bilateral hydronephrosis. Cr stable, UOP excellent. Consideration of urgent percutaneous nephrostomy or cysto/stents could be made if pt gets worsening Cr, decreased UOP, fever, flank pain,etc. Currently stable. Would need to be off coumadin with normal INR for nephrostomy or TURBT (see below). Lovenox bridge OK from GU pt of view if anticoagulation needed.   2) Bladder mass  - needs cystoscopy, TURBT, bilateral retrogrades, possible ureteral stents. Scheduled for Mar 1, Tuesday at [facility]. Pt will need normal PT/INR for procedure, ensure clear from pulmonary/pneumonia, final urine culture to ensure adequate antibiotic treatment/coverage.   3) Bacteriuria - expected with a  chronic foley. On meropenam. Urine cx pending.   Will follow. Pt medically complex and will have a narrow window of stability from renal, pulmonary, PT/INR pt of view to get in OR for TURBT, stent placement next Tuesday. I would recommend he stay in house, with UOP and kidney function closely monitored, have coumadin slowly reversed over next 4-5 days in preparation for Mar 1 procedure. If patient needs anticoagulation, lovenox OK from GU pt of view held prior to TURBT. Discussed above extensively with patient and Dr. Tana Coast.      LOS: 4 days   Dandre Sisler 09/21/2014

## 2014-09-21 NOTE — Progress Notes (Signed)
ANTICOAGULATION CONSULT NOTE - Follow Up Consult  Pharmacy Consult:  lovenox Indication:  History of PE   Allergies  Allergen Reactions  . Tomato Other (See Comments)    Unknown reaction    Patient Measurements: Height: 5\' 5"  (165.1 cm) Weight: 256 lb 9.9 oz (116.4 kg) IBW/kg (Calculated) : 61.5  Vital Signs: Temp: 97.6 F (36.4 C) (02/25 0626) Temp Source: Oral (02/25 0626) BP: 182/88 mmHg (02/25 0626) Pulse Rate: 80 (02/25 0626)  Labs:  Recent Labs  09/19/14 0520 09/20/14 0602 09/21/14 0500  HGB 8.5* 9.0* 8.8*  HCT 27.4* 28.1* 27.5*  PLT 248 268 232  LABPROT 37.3* 33.0* 33.2*  INR 3.75* 3.20* 3.23*  CREATININE 1.73* 1.61* 1.87*    Estimated Creatinine Clearance: 51.5 mL/min (by C-G formula based on Cr of 1.87).  Assessment: 79 yom on chronic coumadin for history of PE. INR remains elevated despite administering low doses. CBC is stable and no bleeding noted. Now coumadin to be held for a procedure next Tuesday. Plan to start therapeutic lovenox when INR is </= 2.   Goal of Therapy:  INR 2-3   Plan:  1. Hold coumadin 2. F/u AM INR - start lovenox when INR </= 2  Salome Arnt, PharmD, BCPS Pager # [phone] 09/21/2014 12:59 PM

## 2014-09-21 NOTE — Progress Notes (Signed)
[facility] PROGRESS NOTE  Samuel [name] ZTI:[id number] DOB: [birth year] DOA: 09/17/2014 PCP: [name], MD  Interim Summary Patient is a pleasant 59 year old male with past medical history paraplegia, neurogenic bladder, chronic indwelling Foley catheter, status post left lower extremity amputation, presented as a transfer from his skilled nursing facility to the emergency department on 09/17/2014 with complaints of increasing cough and shortness of breath. Workup included a chest x-ray which revealed left lower lobe infiltrate. Initial lab showed white count of 13,600 which trended up to 20,000 on the following day. He was started on broad-spectrum IV antimicrobial therapy with meropenem and vancomycin for presumed healthcare associated pneumonia. Patient was also found to have acute kidney injury presenting with a creatinine 1.92. Previous lab work on 08/25/2014 showed a creatinine 1.0. He was started on IV fluids. Cultures are pending at the time of this dictation. Lab work drawn on 09/19/2014 showed overall improvement with his white count trending down to 10.2, creatinine trended down to 1.7 from 1.9. Renal ultrasound showed the presence of bilateral hydronephrosis. Patient was continuing to have hematuria.  Assessment/Plan: 1. Healthcare associated pneumonia/ left lower lobe infiltrates -Continue broad-spectrum IV antimicrobial therapy with meropenem and vancomycin -Blood cultures negative so far, UA shows more than 100,000 colonies of gram-negative rods.  - Follow chest x-ray on Monday to assess improvement in the pneumonia.  2.   Sepsis likely secondary to healthcare associated pneumonia and UTI -Continue broad-spectrum IV antimicrobial therapy with vancomycin and meropenem -Patient has a history of chronic Foley catheter, follow urine culture and sensitivities  3.  Acute kidney injury. -Patient presenting with an elevated creatinine of 1.9, having a normal kidney  function on 08/25/2014 -Likely secondary to critical illness, sepsis and hypovolemia and UTI. -Continue IV fluid hydration  4.  Hematuria/bilateral hydronephrosis possibly due to bladder wall thickening and ?tumor -Patient on chronic anticoagulation presented with hematuria initially felt to be secondary to recent Foley catheter exchange. -Renal ultrasound showed the presence of bilateral hydronephrosis, CT scan of the abdomen and pelvis showed moderate to severe left hydronephrosis with bladder wall thickening and possible tumor. - Urology consulted, I discussed with Dr. [name] today, unfortunately this hydronephrosis has significantly worsened from a recent imaging (had only right-sided hydronephrosis at that time), and patient likely needs for drainage or stent placement with TUR bladder tumor which is already scheduled on 3/1 at [facility]. Dr. [name] recommended holding the Coumadin for the procedure and patient will continue to stay inpatient as he has a potential of having worsening of his renal failure with current issue. - Hematuria is currently improving and he's making good urine output.  5.  History of pulmonary embolism. - Due to planned neurological procedure early next week, will hold Coumadin. INR is currently supratherapeutic. Once INR closer to 2 or less than 2, will place on therapeutic Lovenox. Lovenox will be held 24 hours prior to the procedure. - Patient had IVC filter placement in 08/2013 for large PE. Patient has been treated for PE with Coumadin for almost one year however due to creatinine of 1.87, I'm unable to obtain a CT chest with contrast to assess for any residual pulmonary embolism.  6. Hypokalemia- replaced   Code Status: Full code  Family Communication:   Disposition Plan: Back to skilled nursing facility once medically stableThe likely next week   Consultants:  Pharmacy  Antibiotics:  Vancomycin (started on 09/17/2014)  Meropenem (started on  09/17/2014)  Subjective:  Patient seen and examined,hematuria resolved this morning, no acute  issues overnight   Objective:  BP 182/88 mmHg  Pulse 80  Temp(Src) 97.6 F (36.4 C) (Oral)  Resp 20  Ht 5\' 5"  (1.651 m)  Wt 116.4 kg (256 lb 9.9 oz)  BMI 42.70 kg/m2  SpO2 98%   Intake/Output Summary (Last 24 hours) at 09/21/14 1250 Last data filed at 09/21/14 5885  Gross per 24 hour  Intake    790 ml  Output   5150 ml  Net  -4360 ml   Filed Weights   09/17/14 2000  Weight: 116.4 kg (256 lb 9.9 oz)    Exam:   General: Alert and oriented 4, NAD, pleasant  CVS: Regular rate and rhythm, normal S1 and S2.   Resp: Decreased breath sound at the bases otherwise no wheezing, rales or rhonchi   Abdomen: Obese, soft nontender nondistended, status post ostomy placement  Extremities: Status post left AKA     Data Reviewed: Basic Metabolic Panel:  Recent Labs Lab 09/17/14 1651 09/17/14 1705 09/18/14 0309 09/19/14 0520 09/20/14 0602 09/21/14 0500  NA 144 143 143 143 141 143  K 3.3* 3.3* 3.5 3.7 3.3* 3.2*  CL 108 104 111 113* 112 108  CO2 30  --  25 24 25 26   GLUCOSE 124* 123* 111* 118* 96 95  BUN 17 19 17 19 14 19   CREATININE 1.92* 1.80* 1.97* 1.73* 1.61* 1.87*  CALCIUM 9.0  --  7.8* 7.6* 8.1* 8.6   Liver Function Tests:  Recent Labs Lab 09/17/14 1651 09/18/14 0309  AST 17 15  ALT 15 13  ALKPHOS 67 51  BILITOT 0.5 0.5  PROT 6.5 5.3*  ALBUMIN 3.3* 2.6*   No results for input(s): LIPASE, AMYLASE in the last 168 hours. No results for input(s): AMMONIA in the last 168 hours. CBC:  Recent Labs Lab 09/17/14 1651 09/17/14 1705 09/18/14 0309 09/19/14 0520 09/20/14 0602 09/21/14 0500  WBC 13.6*  --  20.1* 10.5 11.3* 10.3  NEUTROABS 11.7*  --   --   --   --   --   HGB 10.9* 12.2* 9.3* 8.5* 9.0* 8.8*  HCT 34.6* 36.0* 29.7* 27.4* 28.1* 27.5*  MCV 83.2  --  85.1 85.6 82.4 83.3  PLT 271  --  269 248 268 232   Cardiac Enzymes: No results for input(s):  CKTOTAL, CKMB, CKMBINDEX, TROPONINI in the last 168 hours. BNP (last 3 results)  Recent Labs  09/17/14 1651  BNP 41.0    ProBNP (last 3 results)  Recent Labs  12/07/13 2221  PROBNP 56.9    CBG: No results for input(s): GLUCAP in the last 168 hours.  Recent Results (from the past 240 hour(s))  Urine culture     Status: None (Preliminary result)   Collection Time: 09/17/14  4:29 PM  Result Value Ref Range Status   Specimen Description URINE, CATHETERIZED  Final   Special Requests NONE  Final   Colony Count   Final    >=100,000 COLONIES/ML Performed at [facility]    Culture   Final    [place] Performed at [facility]    Report Status PENDING  Incomplete  Blood Culture (routine x 2)     Status: None (Preliminary result)   Collection Time: 09/17/14  4:40 PM  Result Value Ref Range Status   Specimen Description BLOOD RIGHT ARM  Final   Special Requests BOTTLES DRAWN AEROBIC AND ANAEROBIC 10CC  Final   Culture   Final  BLOOD CULTURE RECEIVED NO GROWTH TO DATE CULTURE WILL BE HELD FOR 5 DAYS BEFORE ISSUING A FINAL NEGATIVE REPORT Performed at [facility]    Report Status PENDING  Incomplete  Blood Culture (routine x 2)     Status: None (Preliminary result)   Collection Time: 09/17/14  4:55 PM  Result Value Ref Range Status   Specimen Description BLOOD LEFT HAND  Final   Special Requests BOTTLES DRAWN AEROBIC ONLY 5CC  Final   Culture   Final           BLOOD CULTURE RECEIVED NO GROWTH TO DATE CULTURE WILL BE HELD FOR 5 DAYS BEFORE ISSUING A FINAL NEGATIVE REPORT Performed at [facility]    Report Status PENDING  Incomplete  MRSA PCR Screening     Status: Abnormal   Collection Time: 09/17/14  8:10 PM  Result Value Ref Range Status   MRSA by PCR POSITIVE (A) NEGATIVE Final    Comment:        The GeneXpert MRSA Assay (FDA approved for NASAL specimens only), is one component of a comprehensive MRSA  colonization surveillance program. It is not intended to diagnose MRSA infection nor to guide or monitor treatment for MRSA infections. RESULT CALLED TO, READ BACK BY AND VERIFIED WITH: PETTIFORD,A RN [id number] 09/18/14 MITCHELL,L      Studies: Dg Chest 2 View  09/20/2014   CLINICAL DATA:  Pneumonia.  EXAM: CHEST  2 VIEW  COMPARISON:  09/17/2014  FINDINGS: Right PICC line is in place with the tip in the SVC. Very low lung volumes. Elevation of the right hemidiaphragm. Limited evaluation due to the low volumes. There is cardiomegaly. No acute bony abnormality.  IMPRESSION: Limited study due to very low lung volumes. Findings similar to prior study.  Right PICC line tip in the SVC.   Electronically Signed   By: Rolm Baptise M.D.   On: 09/20/2014 12:06    Scheduled Meds: . antiseptic oral rinse  7 mL Mouth Rinse BID  . atorvastatin  10 mg Oral QHS  . baclofen  20 mg Oral 3 times per day  . Chlorhexidine Gluconate Cloth  6 each Topical Q0600  . docusate sodium  100 mg Oral BID  . DULoxetine  60 mg Oral QHS  . fentaNYL  50 mcg Transdermal Q72H  . lamoTRIgine  200 mg Oral BID  . levothyroxine  125 mcg Oral QAC breakfast  . meropenem (MERREM) IV  1 g Intravenous Q12H  . metoCLOPramide  5 mg Oral TID AC & HS  . mupirocin ointment  1 application Nasal BID  . oxybutynin  5 mg Oral Daily  . pantoprazole  40 mg Oral BID  . potassium chloride  40 mEq Oral Q4H  . predniSONE  10 mg Oral Q breakfast  . pregabalin  75 mg Oral BID  . QUEtiapine  100 mg Oral BID  . saccharomyces boulardii  250 mg Oral BID  . sodium chloride  3 mL Intravenous Q12H  . vancomycin  1,250 mg Intravenous Q24H    Time spent 30 minutes.  Calise Dunckel M.D. [facility] 09/21/2014, 12:50 PM  Pager: [phone]

## 2014-09-22 ENCOUNTER — Other Ambulatory Visit: Payer: Self-pay | Admitting: Urology

## 2014-09-22 ENCOUNTER — Inpatient Hospital Stay (HOSPITAL_COMMUNITY): Payer: Medicare Other

## 2014-09-22 LAB — URINE CULTURE: Colony Count: >=100000

## 2014-09-22 LAB — BASIC METABOLIC PANEL
ANION GAP: 7 (ref 5–15)
BUN: 19 mg/dL (ref 6–23)
CO2: 26 mmol/L (ref 19–32)
Calcium: 8.4 mg/dL (ref 8.4–10.5)
Chloride: 109 mmol/L (ref 96–112)
Creatinine, Ser: 2.31 mg/dL — ABNORMAL HIGH (ref 0.50–1.35)
GFR, EST AFRICAN AMERICAN: 34 mL/min — AB (ref >90)
GFR, EST NON AFRICAN AMERICAN: 30 mL/min — AB (ref >90)
Glucose, Bld: 96 mg/dL (ref 70–99)
POTASSIUM: 3.7 mmol/L (ref 3.5–5.1)
SODIUM: 142 mmol/L (ref 135–145)

## 2014-09-22 LAB — CBC
HCT: 28.5 % — ABNORMAL LOW (ref 39.0–52.0)
Hemoglobin: 9.1 g/dL — ABNORMAL LOW (ref 13.0–17.0)
MCH: 25.9 pg — ABNORMAL LOW (ref 26.0–34.0)
MCHC: 31.9 g/dL (ref 30.0–36.0)
MCV: 81.2 fL (ref 78.0–100.0)
PLATELETS: 228 10*3/uL (ref 150–400)
RBC: 3.51 MIL/uL — AB (ref 4.22–5.81)
RDW: 15 % (ref 11.5–15.5)
WBC: 10.5 10*3/uL (ref 4.0–10.5)

## 2014-09-22 LAB — BLOOD GAS, ARTERIAL
Acid-base deficit: 0.6 mmol/L (ref 0.0–2.0)
Bicarbonate: 23.7 mEq/L (ref 20.0–24.0)
Drawn by: 42624
O2 Content: 2 L/min
O2 Saturation: 97.1 %
PCO2 ART: 40 mmHg (ref 35.0–45.0)
PO2 ART: 96.5 mmHg (ref 80.0–100.0)
Patient temperature: 98.6
TCO2: 24.9 mmol/L (ref 0–100)
pH, Arterial: 7.39 (ref 7.350–7.450)

## 2014-09-22 LAB — PROTIME-INR
INR: 2.5 — ABNORMAL HIGH (ref 0.00–1.49)
INR: 1.57 — AB (ref 0.00–1.49)
Prothrombin Time: 27.2 seconds — ABNORMAL HIGH (ref 11.6–15.2)
Prothrombin Time: 18.9 seconds — ABNORMAL HIGH (ref 11.6–15.2)

## 2014-09-22 LAB — LACTIC ACID, PLASMA: Lactic Acid, Venous: 0.9 mmol/L (ref 0.5–2.0)

## 2014-09-22 LAB — GLUCOSE, CAPILLARY: GLUCOSE-CAPILLARY: 91 mg/dL (ref 70–99)

## 2014-09-22 MED ORDER — HYDRALAZINE HCL 20 MG/ML IJ SOLN
10.0000 mg | Freq: Four times a day (QID) | INTRAMUSCULAR | Status: DC | PRN
Start: 2014-09-22 — End: 2014-09-24
  Filled 2014-09-22: qty 1

## 2014-09-22 MED ORDER — ACETAMINOPHEN 10 MG/ML IV SOLN
1000.0000 mg | Freq: Once | INTRAVENOUS | Status: AC
  Administered 2014-09-22: 1000 mg via INTRAVENOUS
  Filled 2014-09-22: qty 100

## 2014-09-22 MED ORDER — LORAZEPAM 2 MG/ML IJ SOLN
0.5000 mg | Freq: Once | INTRAMUSCULAR | Status: AC
  Administered 2014-09-22: 0.5 mg via INTRAVENOUS
  Filled 2014-09-22: qty 1

## 2014-09-22 MED ORDER — AMLODIPINE BESYLATE 10 MG PO TABS
10.0000 mg | ORAL_TABLET | Freq: Every day | ORAL | Status: DC
  Administered 2014-09-22: 10 mg via ORAL
  Filled 2014-09-22 (×3): qty 1

## 2014-09-22 MED ORDER — HYDRALAZINE HCL 20 MG/ML IJ SOLN
10.0000 mg | Freq: Once | INTRAMUSCULAR | Status: AC
  Administered 2014-09-22: 10 mg via INTRAVENOUS
  Filled 2014-09-22: qty 1

## 2014-09-22 MED ORDER — VITAMIN K1 10 MG/ML IJ SOLN
5.0000 mg | Freq: Once | INTRAMUSCULAR | Status: AC
  Administered 2014-09-22: 5 mg via SUBCUTANEOUS
  Filled 2014-09-22: qty 0.5

## 2014-09-22 MED ORDER — AMLODIPINE BESYLATE 5 MG PO TABS: 5.0000 mg | ORAL_TABLET | Freq: Every day | ORAL | Status: DC
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
## 2014-09-22 NOTE — Progress Notes (Signed)
Floor Coverage HO Callahan paged, pt has acute change in mental status, A+ox2, unable to re-orient patient, extremely agitated after awakening from sleep, asked to come to bedside to evaluate as soon as possible.

## 2014-09-22 NOTE — Progress Notes (Signed)
[facility] PROGRESS NOTE  Tiffany Talarico EAV:[id number] DOB: 08-31-1955 DOA: 09/17/2014 PCP: [name], MD  Interim Summary Patient is a pleasant 59 year old male with past medical history paraplegia, neurogenic bladder, chronic indwelling Foley catheter, status post left lower extremity amputation, presented as a transfer from his skilled nursing facility to the emergency department on 09/17/2014 with complaints of increasing cough and shortness of breath. Workup included a chest x-ray which revealed left lower lobe infiltrate. Initial lab showed white count of 13,600 which trended up to 20,000 on the following day. He was started on broad-spectrum IV antimicrobial therapy with meropenem and vancomycin for presumed healthcare associated pneumonia. Patient was also found to have acute kidney injury presenting with a creatinine 1.92. Previous lab work on 08/25/2014 showed a creatinine 1.0. He was started on IV fluids. Cultures are pending at the time of this dictation. Lab work drawn on 09/19/2014 showed overall improvement with his white count trending down to 10.2, creatinine trended down to 1.7 from 1.9. Renal ultrasound showed the presence of bilateral hydronephrosis. Patient was continuing to have hematuria.  Assessment/Plan: 1. Healthcare associated pneumonia/ left lower lobe infiltrates -Continue broad-spectrum IV antimicrobial therapy with meropenem and vancomycin -Blood cultures negative so far  2.   Klebsiella and Acinetobacter UTI - Patient has a history of chronic Foley catheter due to neurogenic bladder, possibly has chronic colonization. - Culture and sensitivities reviewed, Klebsiella is sensitive to meropenem however Acinetobacter is not. Requested ID consult, Dr. [name] will see the patient for further recommendations regarding the antibiotics.  3.  Acute kidney injury with bilateral hydronephrosis. -Patient presenting with an elevated creatinine of 1.9, having  a normal kidney function on 08/25/2014, currently worsening despite IV fluids, Foley catheter which is draining normally -Increase IV fluids to 100 mL/ hour, discussed in detail with urology, Dr. [name], see below  4.  Hematuria/bilateral hydronephrosis possibly due to bladder wall thickening and ?tumor -Patient on chronic anticoagulation presented with hematuria initially felt to be secondary to recent Foley catheter exchange. -Renal ultrasound showed the presence of bilateral hydronephrosis, CT scan of the abdomen and pelvis showed moderate to severe left hydronephrosis with bladder wall thickening and possible tumor.  - Urology consulted, I discussed with Dr. [name] today, unfortunately this hydronephrosis has significantly worsened from a recent imaging (had only right-sided hydronephrosis at that time), and patient likely needs for drainage or stent placement with TUR bladder tumor which was initially scheduled on 3/1 at [facility]. However due to worsening renal function and hematuria restarted, Dr. [name] is recommending scheduling the procedure earlier on 2/28 morning. Recommended ID consult and reversing the Coumadin for the procedure. - will give vitamin K, 5mg , once INR less than 2, started on heparin drip which will be temporarily placed on hold for the procedure.  5.  History of pulmonary embolism after surgery for left hip fracture in 2015. - Hold Coumadin and reports for the urological procedure - Patient had IVC filter placement in 09/22/2013 for large PE but subsequently was removed, confirmed per PCP notes, Dr. Dellia Nims (note on 09/05/14).  Reviewed PCP notes, patient was recommended lifelong Coumadin, possibly due to his multiple comorbidities and paraplegic state.  6. Essential hypertension Restarted home amlodipine.  Code Status: Full code  Family Communication:   Disposition Plan: Dr. [name] recommended transfer to [facility] for the  procedure   Consultants:  Pharmacy  Antibiotics:  Vancomycin (started on 09/17/2014)  Meropenem (started on 09/17/2014)  Subjective:  Patient seen and examined, hematuria, also  complaining of ringing in his ears, afebrile   Objective:  BP 181/116 mmHg  Pulse 81  Temp(Src) 98.2 F (36.8 C) (Oral)  Resp 15  Ht 5\' 5"  (1.651 m)  Wt 116.4 kg (256 lb 9.9 oz)  BMI 42.70 kg/m2  SpO2 95%   Intake/Output Summary (Last 24 hours) at 09/22/14 1139 Last data filed at 09/22/14 1108  Gross per 24 hour  Intake 1927.5 ml  Output   5050 ml  Net -3122.5 ml   Filed Weights   09/17/14 2000  Weight: 116.4 kg (256 lb 9.9 oz)    Exam:   General: Alert and oriented 4, NAD, pleasant  CVS: Regular rate and rhythm, normal S1 and S2.   Resp: Decreased breath sound at the bases otherwise no wheezing, rales or rhonchi   Abdomen: Obese, soft nontender nondistended, status post ostomy placement  Extremities: Status post left AKA   GU: Hematuria, Foley catheter  Data Reviewed: Basic Metabolic Panel:  Recent Labs Lab 09/18/14 0309 09/19/14 0520 09/20/14 0602 09/21/14 0500 09/22/14 0600  NA 143 143 141 143 142  K 3.5 3.7 3.3* 3.2* 3.7  CL 111 113* 112 108 109  CO2 25 24 25 26 26   GLUCOSE 111* 118* 96 95 96  BUN 17 19 14 19 19   CREATININE 1.97* 1.73* 1.61* 1.87* 2.31*  CALCIUM 7.8* 7.6* 8.1* 8.6 8.4   Liver Function Tests:  Recent Labs Lab 09/17/14 1651 09/18/14 0309  AST 17 15  ALT 15 13  ALKPHOS 67 51  BILITOT 0.5 0.5  PROT 6.5 5.3*  ALBUMIN 3.3* 2.6*   No results for input(s): LIPASE, AMYLASE in the last 168 hours. No results for input(s): AMMONIA in the last 168 hours. CBC:  Recent Labs Lab 09/17/14 1651  09/18/14 0309 09/19/14 0520 09/20/14 0602 09/21/14 0500 09/22/14 0600  WBC 13.6*  --  20.1* 10.5 11.3* 10.3 10.5  NEUTROABS 11.7*  --   --   --   --   --   --   HGB 10.9*  < > 9.3* 8.5* 9.0* 8.8* 9.1*  HCT 34.6*  < > 29.7* 27.4* 28.1* 27.5*  28.5*  MCV 83.2  --  85.1 85.6 82.4 83.3 81.2  PLT 271  --  269 248 268 232 228  < > = values in this interval not displayed. Cardiac Enzymes: No results for input(s): CKTOTAL, CKMB, CKMBINDEX, TROPONINI in the last 168 hours. BNP (last 3 results)  Recent Labs  09/17/14 1651  BNP 41.0    ProBNP (last 3 results)  Recent Labs  12/07/13 2221  PROBNP 56.9    CBG: No results for input(s): GLUCAP in the last 168 hours.  Recent Results (from the past 240 hour(s))  Urine culture     Status: None   Collection Time: 09/17/14  4:29 PM  Result Value Ref Range Status   Specimen Description URINE, CATHETERIZED  Final   Special Requests NONE  Final   Colony Count >=100,000 COLONIES/ML  Final   Culture   Final    ACINETOBACTER CALCOACETICUS/BAUMANNII COMPLEX KLEBSIELLA PNEUMONIAE Note: Confirmed Extended Spectrum Beta-Lactamase Producer (ESBL) CRITICAL RESULT CALLED TO, READ BACK BY AND VERIFIED WITH: Carin Hock RN on 09/22/14 at 02:05 by Rise Mu    Report Status 09/22/2014 FINAL  Final   Organism ID, Bacteria ACINETOBACTER CALCOACETICUS/BAUMANNII COMPLEX  Final   Organism ID, Bacteria KLEBSIELLA PNEUMONIAE  Final      Susceptibility   Klebsiella pneumoniae - MIC*    AMPICILLIN >=32 RESISTANT  Resistant     CEFAZOLIN >=64 RESISTANT Resistant     CEFTRIAXONE >=64 RESISTANT Resistant     CIPROFLOXACIN >=4 RESISTANT Resistant     GENTAMICIN >=16 RESISTANT Resistant     LEVOFLOXACIN >=8 RESISTANT Resistant     NITROFURANTOIN 256 RESISTANT Resistant     TOBRAMYCIN 2 SENSITIVE Sensitive     TRIMETH/SULFA >=320 RESISTANT Resistant     IMIPENEM <=0.25 SENSITIVE Sensitive     PIP/TAZO >=128 RESISTANT Resistant     * KLEBSIELLA PNEUMONIAE   Acinetobacter calcoaceticus/baumannii complex - MIC*    CEFTRIAXONE Value in next row Resistant      >=64 RESISTANTPerformed at [facility]    CIPROFLOXACIN Value in next row Resistant      >=4 RESISTANTPerformed at [facility]     GENTAMICIN Value in next row Sensitive      <=1 SENSITIVEPerformed at [facility]    PIP/TAZO Value in next row Resistant      >=128 RESISTANTPerformed at [facility]    TOBRAMYCIN Value in next row Sensitive      <=1 SENSITIVEPerformed at [facility]    TRIMETH/SULFA Value in next row Resistant      >=320 RESISTANTPerformed at [facility]    LEVOFLOXACIN Value in next row Resistant      >=8 RESISTANTPerformed at [place] Value in next row Resistant      >=512 RESISTANTPerformed at [facility]    IMIPENEM Value in next row Resistant      >=16 RESISTANTPerformed at [place]  Blood Culture (routine x 2)     Status: None (Preliminary result)   Collection Time: 09/17/14  4:40 PM  Result Value Ref Range Status   Specimen Description BLOOD RIGHT ARM  Final   Special Requests BOTTLES DRAWN AEROBIC AND ANAEROBIC 10CC  Final   Culture   Final           BLOOD CULTURE RECEIVED NO GROWTH TO DATE CULTURE WILL BE HELD FOR 5 DAYS BEFORE ISSUING A FINAL NEGATIVE REPORT Performed at [facility]    Report Status PENDING  Incomplete  Blood Culture (routine x 2)     Status: None (Preliminary result)   Collection Time: 09/17/14  4:55 PM  Result Value Ref Range Status   Specimen Description BLOOD LEFT HAND  Final   Special Requests BOTTLES DRAWN AEROBIC ONLY 5CC  Final   Culture   Final           BLOOD CULTURE RECEIVED NO GROWTH TO DATE CULTURE WILL BE HELD FOR 5 DAYS BEFORE ISSUING A FINAL NEGATIVE REPORT Performed at [facility]    Report Status PENDING  Incomplete  MRSA PCR Screening     Status: Abnormal   Collection Time: 09/17/14  8:10 PM  Result Value Ref Range Status   MRSA by PCR POSITIVE (A) NEGATIVE Final    Comment:        The GeneXpert MRSA Assay (FDA approved for NASAL specimens only), is one component of a comprehensive MRSA  colonization surveillance program. It is not intended to diagnose MRSA infection nor to guide or monitor treatment for MRSA infections. RESULT CALLED TO, READ BACK BY AND VERIFIED WITH: PETTIFORD,A RN [id number] 09/18/14 MITCHELL,L      Studies: Dg Chest 2 View  09/20/2014   CLINICAL DATA:  Pneumonia.  EXAM: CHEST  2 VIEW  COMPARISON:  09/17/2014  FINDINGS: Right PICC line is in place with the tip in the SVC. Very low lung volumes. Elevation of the right hemidiaphragm. Limited evaluation due to the low volumes. There is cardiomegaly. No acute bony abnormality.  IMPRESSION: Limited study due to very low lung volumes. Findings similar to prior study.  Right PICC line tip in the SVC.   Electronically Signed   By: Rolm Baptise M.D.   On: 09/20/2014 12:06    Scheduled Meds: . amLODipine  10 mg Oral Daily  . antiseptic oral rinse  7 mL Mouth Rinse BID  . atorvastatin  10 mg Oral QHS  . baclofen  20 mg Oral 3 times per day  . Chlorhexidine Gluconate Cloth  6 each Topical Q0600  . docusate sodium  100 mg Oral BID  . DULoxetine  60 mg Oral QHS  . fentaNYL  50 mcg Transdermal Q72H  . lamoTRIgine  200 mg Oral BID  . levothyroxine  125 mcg Oral QAC breakfast  . meropenem (MERREM) IV  1 g Intravenous Q12H  . metoCLOPramide  5 mg Oral TID AC & HS  . mupirocin ointment  1 application Nasal BID  . oxybutynin  5 mg Oral Daily  . pantoprazole  40 mg Oral BID  . phytonadione  5 mg Subcutaneous Once  . predniSONE  10 mg Oral Q breakfast  . pregabalin  75 mg Oral BID  . QUEtiapine  100 mg Oral BID  . saccharomyces boulardii  250 mg Oral BID  . sodium chloride  3 mL Intravenous Q12H  . vancomycin  1,250 mg Intravenous Q24H    Time spent 35 minutes.  RAI,RIPUDEEP M.D. [facility] 09/22/2014, 11:39 AM  Pager: [phone]

## 2014-09-22 NOTE — Progress Notes (Signed)
Paged HO Callahan, BP 157/99, spiked temp, Temp 37.7, HR 99, RR 24, RA 99%, clammy, sweaty. ABG, EKG, Lactic Acid, and CXRAY ordered, passed information onto night shift RN Cassandra.

## 2014-09-22 NOTE — Clinical Social Work Note (Signed)
CSW has left report for weekend CSW. CSW has also notified [facility] CSW of 4E and 4W that patient will likely transfer to [facility] over the weekend. Patient will return to [place] at discharge.   Liz Beach MSW, [place], [place], [phone]

## 2014-09-22 NOTE — Progress Notes (Signed)
CRITICAL VALUE ALERT  Critical value received:  C/s  Urine :  Greater than 100,000 colonized, positive for 2 organisms Acinetobactor and  Klebsiella pneumonea  Date of notification:  09/22/14  Time of notification:  0209  Critical value read back yes  Nurse who received alert:  Carin Hock RN  MD notified (1st page):  Raliegh Ip Schorr  Time of first page: 0220  MD notified (2nd page):  Time of second page:  Responding MD:  Lamar Blinks  Time MD responded:  [phone]

## 2014-09-22 NOTE — Progress Notes (Signed)
Subjective: Patient reports No complaints today.  Urine culture grew Acinetobacter.  This needs treatment.  INR is correcting.  Creatinine has bumped up some.  Objective: Vital signs in last 24 hours: Temp:  [97.9 F (36.6 C)-98.8 F (37.1 C)] 97.9 F (36.6 C) (02/26 1353) Pulse Rate:  [81-103] 81 (02/26 1353) Resp:  [12-18] 16 (02/26 1353) BP: (136-192)/(74-116) 181/103 mmHg (02/26 1353) SpO2:  [95 %-98 %] 98 % (02/26 1353)  Intake/Output from previous day: 02/25 0701 - 02/26 0700 In: 2447.5 [P.O.:1080; I.V.:967.5; IV Piggyback:400] Out: 3050 [Urine:3050] Intake/Output this shift: Total I/O In: -  Out: 3000 [Urine:3000]  Physical Exam:  No acute distress  Foley in place urine clear  Lab Results:  Recent Labs  09/20/14 0602 09/21/14 0500 09/22/14 0600  HGB 9.0* 8.8* 9.1*  HCT 28.1* 27.5* 28.5*   BMET  Recent Labs  09/21/14 0500 09/22/14 0600  NA 143 142  K 3.2* 3.7  CL 108 109  CO2 26 26  GLUCOSE 95 96  BUN 19 19  CREATININE 1.87* 2.31*  CALCIUM 8.6 8.4    Recent Labs  09/20/14 0602 09/21/14 0500 09/22/14 0600  INR 3.20* 3.23* 2.50*   No results for input(s): LABURIN in the last 72 hours. Results for orders placed or performed during the hospital encounter of 09/17/14  Urine culture     Status: None   Collection Time: 09/17/14  4:29 PM  Result Value Ref Range Status   Specimen Description URINE, CATHETERIZED  Final   Special Requests NONE  Final   Colony Count >=100,000 COLONIES/ML  Final   Culture   Final    ACINETOBACTER CALCOACETICUS/BAUMANNII COMPLEX KLEBSIELLA PNEUMONIAE Note: Confirmed Extended Spectrum Beta-Lactamase Producer (ESBL) CRITICAL RESULT CALLED TO, READ BACK BY AND VERIFIED WITH: Carin Hock RN on 09/22/14 at 02:05 by Rise Mu    Report Status 09/22/2014 FINAL  Final   Organism ID, Bacteria ACINETOBACTER CALCOACETICUS/BAUMANNII COMPLEX  Final   Organism ID, Bacteria KLEBSIELLA PNEUMONIAE  Final      Susceptibility   Klebsiella pneumoniae - MIC*    AMPICILLIN >=32 RESISTANT Resistant     CEFAZOLIN >=64 RESISTANT Resistant     CEFTRIAXONE >=64 RESISTANT Resistant     CIPROFLOXACIN >=4 RESISTANT Resistant     GENTAMICIN >=16 RESISTANT Resistant     LEVOFLOXACIN >=8 RESISTANT Resistant     NITROFURANTOIN 256 RESISTANT Resistant     TOBRAMYCIN 2 SENSITIVE Sensitive     TRIMETH/SULFA >=320 RESISTANT Resistant     IMIPENEM <=0.25 SENSITIVE Sensitive     PIP/TAZO >=128 RESISTANT Resistant     * KLEBSIELLA PNEUMONIAE   Acinetobacter calcoaceticus/baumannii complex - MIC*    CEFTRIAXONE Value in next row Resistant      >=64 RESISTANTPerformed at [facility]    CIPROFLOXACIN Value in next row Resistant      >=4 RESISTANTPerformed at [facility]    GENTAMICIN Value in next row Sensitive      <=1 SENSITIVEPerformed at [facility]    PIP/TAZO Value in next row Resistant      >=128 RESISTANTPerformed at [facility]    TOBRAMYCIN Value in next row Sensitive      <=1 SENSITIVEPerformed at [facility]    TRIMETH/SULFA Value in next row Resistant      >=320 RESISTANTPerformed at [facility]    LEVOFLOXACIN Value in next row Resistant      >=8 RESISTANTPerformed at [facility]    NITROFURANTOIN Value in  next row Resistant      >=512 RESISTANTPerformed at [facility]    IMIPENEM Value in next row Resistant      >=16 RESISTANTPerformed at [place]  Blood Culture (routine x 2)     Status: None (Preliminary result)   Collection Time: 09/17/14  4:40 PM  Result Value Ref Range Status   Specimen Description BLOOD RIGHT ARM  Final   Special Requests BOTTLES DRAWN AEROBIC AND ANAEROBIC 10CC  Final   Culture   Final           BLOOD CULTURE RECEIVED NO GROWTH TO DATE CULTURE WILL BE HELD FOR 5 DAYS BEFORE ISSUING A FINAL NEGATIVE REPORT Performed at [facility]    Report  Status PENDING  Incomplete  Blood Culture (routine x 2)     Status: None (Preliminary result)   Collection Time: 09/17/14  4:55 PM  Result Value Ref Range Status   Specimen Description BLOOD LEFT HAND  Final   Special Requests BOTTLES DRAWN AEROBIC ONLY 5CC  Final   Culture   Final           BLOOD CULTURE RECEIVED NO GROWTH TO DATE CULTURE WILL BE HELD FOR 5 DAYS BEFORE ISSUING A FINAL NEGATIVE REPORT Performed at [facility]    Report Status PENDING  Incomplete  MRSA PCR Screening     Status: Abnormal   Collection Time: 09/17/14  8:10 PM  Result Value Ref Range Status   MRSA by PCR POSITIVE (A) NEGATIVE Final    Comment:        The GeneXpert MRSA Assay (FDA approved for NASAL specimens only), is one component of a comprehensive MRSA colonization surveillance program. It is not intended to diagnose MRSA infection nor to guide or monitor treatment for MRSA infections. RESULT CALLED TO, READ BACK BY AND VERIFIED WITH: PETTIFORD,A RN [id number] 09/18/14 MITCHELL,L     Studies/Results: No results found.   Assessment/plan: #1 acute renal failure-creatinine and worsening but still good urine output.  Again patient would need nephrostomy tubes or stents and INR still out.  Nephrostomy tube is not a good option.  Also needs bladder tumor biopsy, therefore cystoscopy TURBT with stent placement could get everything done and one anesthetic.  We'll continue to monitor kidney function.  #2 bacteriuria-discussed with Dr. Tana Coast.  She will begin /continue treatment for positive urine cultures.Again patient stable without fever.  Do not think he needs urgent drainage at this time.  #3 anticoagulation-INR reversing.  We'll plan to get patient on this weekend for more urgent TURBT stent placement.  Hopefully again to get both procedures done under one anesthetic given this patient's complex medical situation.  Appreciate Dr. Tana Coast excellent patient care, transfer to [facility] long tomorrow, plan  TURBT, stent Sun AM (to allow 2 more days on antibiotics for positive urine cultures) Unless patient needed to go urgently tomorrow could add on for simple stent placement tomorrow.  Will follow    LOS: 5 days   Samuel [name] 09/22/2014, 2:18 PM

## 2014-09-22 NOTE — Progress Notes (Signed)
Pt BP high this am medicated for pain and rechecked at 0640 BP still high  @ 181/116 HR 130 on call personnel called x 2  With no returned call. . Pt stated he is ok ,and asymptomatic for  Increased BP.  Care endorsed to oncoming RN for follow up call to MD if BP remain high.

## 2014-09-22 NOTE — Progress Notes (Signed)
HO Rai paged, pt had current acute change in mental status, A+ox2, very drowsy, breathing heavy, not baseline, left direct phone number to call back to discuss intervention.

## 2014-09-23 ENCOUNTER — Inpatient Hospital Stay (HOSPITAL_COMMUNITY): Payer: Medicare Other

## 2014-09-23 DIAGNOSIS — R400 Somnolence: Secondary | ICD-10-CM

## 2014-09-23 DIAGNOSIS — R410 Disorientation, unspecified: Secondary | ICD-10-CM

## 2014-09-23 DIAGNOSIS — G8929 Other chronic pain: Secondary | ICD-10-CM

## 2014-09-23 LAB — HEPATIC FUNCTION PANEL
ALBUMIN: 3.3 g/dL — AB (ref 3.5–5.2)
ALK PHOS: 59 U/L (ref 39–117)
ALT: 12 U/L (ref 0–53)
AST: 20 U/L (ref 0–37)
BILIRUBIN DIRECT: 0.2 mg/dL (ref 0.0–0.5)
BILIRUBIN INDIRECT: 0.9 mg/dL (ref 0.3–0.9)
BILIRUBIN TOTAL: 1.1 mg/dL (ref 0.3–1.2)
TOTAL PROTEIN: 7 g/dL (ref 6.0–8.3)

## 2014-09-23 LAB — BASIC METABOLIC PANEL
Anion gap: 9 (ref 5–15)
BUN: 19 mg/dL (ref 6–23)
CO2: 27 mmol/L (ref 19–32)
Calcium: 8.6 mg/dL (ref 8.4–10.5)
Chloride: 110 mmol/L (ref 96–112)
Creatinine, Ser: 2.34 mg/dL — ABNORMAL HIGH (ref 0.50–1.35)
GFR calc Af Amer: 34 mL/min — ABNORMAL LOW (ref >90)
GFR calc non Af Amer: 29 mL/min — ABNORMAL LOW (ref >90)
Glucose, Bld: 107 mg/dL — ABNORMAL HIGH (ref 70–99)
POTASSIUM: 3.3 mmol/L — AB (ref 3.5–5.1)
Sodium: 146 mmol/L — ABNORMAL HIGH (ref 135–145)

## 2014-09-23 LAB — HEPARIN LEVEL (UNFRACTIONATED)

## 2014-09-23 LAB — CBC
HCT: 27.2 % — ABNORMAL LOW (ref 39.0–52.0)
HEMOGLOBIN: 8.9 g/dL — AB (ref 13.0–17.0)
MCH: 26.3 pg (ref 26.0–34.0)
MCHC: 32.7 g/dL (ref 30.0–36.0)
MCV: 80.5 fL (ref 78.0–100.0)
Platelets: 169 10*3/uL (ref 150–400)
RBC: 3.38 MIL/uL — ABNORMAL LOW (ref 4.22–5.81)
RDW: 14.8 % (ref 11.5–15.5)
WBC: 10 10*3/uL (ref 4.0–10.5)

## 2014-09-23 LAB — AMMONIA: Ammonia: 22 umol/L (ref 11–32)

## 2014-09-23 LAB — LACTIC ACID, PLASMA
LACTIC ACID, VENOUS: 0.6 mmol/L (ref 0.5–2.0)
Lactic Acid, Venous: 0.6 mmol/L (ref 0.5–2.0)

## 2014-09-23 LAB — BLOOD GAS, ARTERIAL
Acid-Base Excess: 0.4 mmol/L (ref 0.0–2.0)
Bicarbonate: 23.4 mEq/L (ref 20.0–24.0)
Drawn by: 10552
FIO2: 0.21 %
O2 SAT: 95.8 %
PATIENT TEMPERATURE: 98.6
PH ART: 7.491 — AB (ref 7.350–7.450)
TCO2: 24.4 mmol/L (ref 0–100)
pCO2 arterial: 31 mmHg — ABNORMAL LOW (ref 35.0–45.0)
pO2, Arterial: 73.2 mmHg — ABNORMAL LOW (ref 80.0–100.0)

## 2014-09-23 LAB — PROTIME-INR
INR: 1.43 (ref 0.00–1.49)
PROTHROMBIN TIME: 17.6 s — AB (ref 11.6–15.2)

## 2014-09-23 LAB — GLUCOSE, CAPILLARY: GLUCOSE-CAPILLARY: 101 mg/dL — AB (ref 70–99)

## 2014-09-23 MED ORDER — LORAZEPAM 2 MG/ML IJ SOLN
INTRAMUSCULAR | Status: AC
  Filled 2014-09-23: qty 1

## 2014-09-23 MED ORDER — ROCURONIUM BROMIDE 50 MG/5ML IV SOLN
INTRAVENOUS | Status: AC
  Filled 2014-09-23: qty 2

## 2014-09-23 MED ORDER — SUCCINYLCHOLINE CHLORIDE 20 MG/ML IJ SOLN
INTRAMUSCULAR | Status: AC
  Filled 2014-09-23: qty 1

## 2014-09-23 MED ORDER — METOPROLOL TARTRATE 1 MG/ML IV SOLN
5.0000 mg | Freq: Three times a day (TID) | INTRAVENOUS | Status: DC
  Administered 2014-09-23 (×3): 5 mg via INTRAVENOUS
  Filled 2014-09-23 (×7): qty 5

## 2014-09-23 MED ORDER — POTASSIUM CHLORIDE 10 MEQ/100ML IV SOLN
10.0000 meq | INTRAVENOUS | Status: AC
  Administered 2014-09-23 (×2): 10 meq via INTRAVENOUS

## 2014-09-23 MED ORDER — PANTOPRAZOLE SODIUM 40 MG IV SOLR
40.0000 mg | Freq: Two times a day (BID) | INTRAVENOUS | Status: DC
  Administered 2014-09-23 – 2014-09-29 (×12): 40 mg via INTRAVENOUS
  Filled 2014-09-23 (×13): qty 40

## 2014-09-23 MED ORDER — DEXTROSE 5 % IV SOLN
1.0000 g | Freq: Once | INTRAVENOUS | Status: AC
  Administered 2014-09-23: 1000 mg via INTRAVENOUS
  Filled 2014-09-23: qty 10

## 2014-09-23 MED ORDER — LEVOTHYROXINE SODIUM 100 MCG IV SOLR
62.5000 ug | Freq: Every day | INTRAVENOUS | Status: DC
  Administered 2014-09-23 – 2014-09-28 (×5): 62.5 ug via INTRAVENOUS
  Filled 2014-09-23 (×8): qty 5

## 2014-09-23 MED ORDER — LIDOCAINE HCL (CARDIAC) 20 MG/ML IV SOLN
INTRAVENOUS | Status: AC
  Filled 2014-09-23: qty 5

## 2014-09-23 MED ORDER — LORAZEPAM 2 MG/ML IJ SOLN
INTRAMUSCULAR | Status: AC
  Filled 2014-09-23: qty 2

## 2014-09-23 MED ORDER — HEPARIN BOLUS VIA INFUSION
2000.0000 [IU] | Freq: Once | INTRAVENOUS | Status: DC
Start: 2014-09-23 — End: 2014-09-23
  Filled 2014-09-23: qty 2000

## 2014-09-23 MED ORDER — FLUMAZENIL 0.5 MG/5ML IV SOLN
0.2000 mg | Freq: Once | INTRAVENOUS | Status: AC
  Administered 2014-09-23: 0.2 mg via INTRAVENOUS
  Filled 2014-09-23: qty 5

## 2014-09-23 MED ORDER — LORAZEPAM 2 MG/ML IJ SOLN
0.5000 mg | Freq: Once | INTRAMUSCULAR | Status: AC
  Administered 2014-09-23: 0.5 mg via INTRAVENOUS
  Filled 2014-09-23: qty 1

## 2014-09-23 MED ORDER — ETOMIDATE 2 MG/ML IV SOLN
INTRAVENOUS | Status: AC
  Filled 2014-09-23: qty 20

## 2014-09-23 MED ORDER — DEXTROSE 5 % IV SOLN
INTRAVENOUS | Status: DC
  Administered 2014-09-23: 1000 mL via INTRAVENOUS
  Administered 2014-09-24: 150 mL via INTRAVENOUS
  Administered 2014-09-24: 1000 mL via INTRAVENOUS

## 2014-09-23 MED ORDER — LORAZEPAM 2 MG/ML IJ SOLN
2.0000 mg | Freq: Once | INTRAMUSCULAR | Status: AC
  Administered 2014-09-23: 2 mg via INTRAVENOUS

## 2014-09-23 MED ORDER — HEPARIN (PORCINE) IN NACL 100-0.45 UNIT/ML-% IJ SOLN
1300.0000 [IU]/h | INTRAMUSCULAR | Status: DC
  Administered 2014-09-23: 1000 [IU]/h via INTRAVENOUS
  Administered 2014-09-23: 1300 [IU]/h via INTRAVENOUS
  Filled 2014-09-23 (×3): qty 250

## 2014-09-23 MED ORDER — POTASSIUM CHLORIDE 10 MEQ/100ML IV SOLN
10.0000 meq | INTRAVENOUS | Status: AC
Start: 2014-09-23 — End: 2014-09-23
  Administered 2014-09-23: 10 meq via INTRAVENOUS
  Filled 2014-09-23 (×3): qty 100

## 2014-09-23 MED ORDER — VALPROATE SODIUM 500 MG/5ML IV SOLN
500.0000 mg | Freq: Three times a day (TID) | INTRAVENOUS | Status: DC
  Administered 2014-09-24 – 2014-09-25 (×4): 500 mg via INTRAVENOUS
  Filled 2014-09-23 (×7): qty 5

## 2014-09-23 MED ORDER — LORAZEPAM 2 MG/ML IJ SOLN
1.0000 mg | Freq: Once | INTRAMUSCULAR | Status: AC
  Administered 2014-09-23: 1 mg via INTRAVENOUS

## 2014-09-23 MED ORDER — OXYCODONE HCL 5 MG PO TABS: 5.0000 mg | ORAL_TABLET | ORAL | Status: DC | PRN

## 2014-09-23 NOTE — Progress Notes (Signed)
Patient ID: Samuel [name], male   DOB: 1955-11-28, 59 y.o.   MRN: [id number]         [facility] for Infectious Disease    Date of Admission:  09/17/2014           Day 7 meropenem  Principal Problem:   HCAP (healthcare-associated pneumonia) Active Problems:   UTI (lower urinary tract infection)   ARF (acute renal failure)   Hematuria   Chronic pain   Sepsis   Somnolence   . antiseptic oral rinse  7 mL Mouth Rinse BID  . atorvastatin  10 mg Oral QHS  . baclofen  20 mg Oral 3 times per day  . fentaNYL  50 mcg Transdermal Q72H  . lamoTRIgine  200 mg Oral BID  . levothyroxine  62.5 mcg Intravenous QAC breakfast  . metoprolol  5 mg Intravenous 3 times per day  . oxybutynin  5 mg Oral Daily  . pantoprazole  40 mg Oral BID  . predniSONE  10 mg Oral Q breakfast  . pregabalin  75 mg Oral BID  . QUEtiapine  100 mg Oral BID  . saccharomyces boulardii  250 mg Oral BID  . sodium chloride  3 mL Intravenous Q12H    OBJECTIVE: Blood pressure 159/87, pulse 79, temperature 98.2 F (36.8 C), temperature source Oral, resp. rate 20, height 5\' 5"  (1.651 m), weight 256 lb 9.9 oz (116.4 kg), SpO2 95 %. General: he remains poorly responsive Lungs: clear anteriorly Cor: regular S1 and S2 with no murmur  Lab Results Lab Results  Component Value Date   WBC 10.0 09/23/2014   HGB 8.9* 09/23/2014   HCT 27.2* 09/23/2014   MCV 80.5 09/23/2014   PLT 169 09/23/2014    Lab Results  Component Value Date   CREATININE 2.34* 09/23/2014   BUN 19 09/23/2014   NA 146* 09/23/2014   K 3.3* 09/23/2014   CL 110 09/23/2014   CO2 27 09/23/2014    Lab Results  Component Value Date   ALT 13 09/18/2014   AST 15 09/18/2014   ALKPHOS 51 09/18/2014   BILITOT 0.5 09/18/2014     Microbiology: Recent Results (from the past 240 hour(s))  Urine culture     Status: None   Collection Time: 09/17/14  4:29 PM  Result Value Ref Range Status   Specimen Description URINE, CATHETERIZED  Final   Special Requests NONE  Final   Colony Count >=100,000 COLONIES/ML  Final   Culture   Final    ACINETOBACTER CALCOACETICUS/BAUMANNII COMPLEX KLEBSIELLA PNEUMONIAE Note: Confirmed Extended Spectrum Beta-Lactamase Producer (ESBL) CRITICAL RESULT CALLED TO, READ BACK BY AND VERIFIED WITH: Carin Hock RN on 09/22/14 at 02:05 by Rise Mu    Report Status 09/22/2014 FINAL  Final   Organism ID, Bacteria ACINETOBACTER CALCOACETICUS/BAUMANNII COMPLEX  Final   Organism ID, Bacteria KLEBSIELLA PNEUMONIAE  Final      Susceptibility   Klebsiella pneumoniae - MIC*    AMPICILLIN >=32 RESISTANT Resistant     CEFAZOLIN >=64 RESISTANT Resistant     CEFTRIAXONE >=64 RESISTANT Resistant     CIPROFLOXACIN >=4 RESISTANT Resistant     GENTAMICIN >=16 RESISTANT Resistant     LEVOFLOXACIN >=8 RESISTANT Resistant     NITROFURANTOIN 256 RESISTANT Resistant     TOBRAMYCIN 2 SENSITIVE Sensitive     TRIMETH/SULFA >=320 RESISTANT Resistant     IMIPENEM <=0.25 SENSITIVE Sensitive     PIP/TAZO >=128 RESISTANT Resistant     * KLEBSIELLA PNEUMONIAE   Acinetobacter  calcoaceticus/baumannii complex - MIC*    CEFTRIAXONE Value in next row Resistant      >=64 RESISTANTPerformed at [facility]    CIPROFLOXACIN Value in next row Resistant      >=4 RESISTANTPerformed at [facility]    GENTAMICIN Value in next row Sensitive      <=1 SENSITIVEPerformed at [facility]    PIP/TAZO Value in next row Resistant      >=128 RESISTANTPerformed at [facility]    TOBRAMYCIN Value in next row Sensitive      <=1 SENSITIVEPerformed at [facility]    TRIMETH/SULFA Value in next row Resistant      >=320 RESISTANTPerformed at [facility]    LEVOFLOXACIN Value in next row Resistant      >=8 RESISTANTPerformed at [place] Value in next row Resistant      >=512 RESISTANTPerformed at [facility]    IMIPENEM Value in next row Resistant      >=16  RESISTANTPerformed at [place]  Blood Culture (routine x 2)     Status: None (Preliminary result)   Collection Time: 09/17/14  4:40 PM  Result Value Ref Range Status   Specimen Description BLOOD RIGHT ARM  Final   Special Requests BOTTLES DRAWN AEROBIC AND ANAEROBIC 10CC  Final   Culture   Final           BLOOD CULTURE RECEIVED NO GROWTH TO DATE CULTURE WILL BE HELD FOR 5 DAYS BEFORE ISSUING A FINAL NEGATIVE REPORT Performed at [facility]    Report Status PENDING  Incomplete  Blood Culture (routine x 2)     Status: None (Preliminary result)   Collection Time: 09/17/14  4:55 PM  Result Value Ref Range Status   Specimen Description BLOOD LEFT HAND  Final   Special Requests BOTTLES DRAWN AEROBIC ONLY 5CC  Final   Culture   Final           BLOOD CULTURE RECEIVED NO GROWTH TO DATE CULTURE WILL BE HELD FOR 5 DAYS BEFORE ISSUING A FINAL NEGATIVE REPORT Performed at [facility]    Report Status PENDING  Incomplete  MRSA PCR Screening     Status: Abnormal   Collection Time: 09/17/14  8:10 PM  Result Value Ref Range Status   MRSA by PCR POSITIVE (A) NEGATIVE Final    Comment:        The GeneXpert MRSA Assay (FDA approved for NASAL specimens only), is one component of a comprehensive MRSA colonization surveillance program. It is not intended to diagnose MRSA infection nor to guide or monitor treatment for MRSA infections. RESULT CALLED TO, READ BACK BY AND VERIFIED WITH: PETTIFORD,A RN [phone] 09/18/14 MITCHELL,L   Culture, blood (routine x 2)     Status: None (Preliminary result)   Collection Time: 09/22/14 11:50 PM  Result Value Ref Range Status   Specimen Description BLOOD LEFT ARM  Final   Special Requests BOTTLES DRAWN AEROBIC AND ANAEROBIC 3CC  Final   Culture PENDING  Incomplete   Report Status PENDING  Incomplete  Culture, blood (routine x 2)     Status: None (Preliminary result)   Collection  Time: 09/22/14 11:56 PM  Result Value Ref Range Status   Specimen Description BLOOD LEFT HAND  Final   Special Requests BOTTLES DRAWN AEROBIC ONLY 2CC  Final   Culture PENDING  Incomplete  Report Status PENDING  Incomplete    Assessment: He has received an appropriate course of meropenem for HCAP. I favor stopping meropenem now in case it is contributing to his diminished mental status. 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 I recommend asking pharmacy to give a preoperative dose of tobramycin before his upcoming urologic procedures  Plan: 1. Discontinue meropenem  Michel Bickers, MD [facility] [place] [facility] [phone] pager   [phone] cell 09/23/2014, 1:09 PM
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
## 2014-09-23 NOTE — Progress Notes (Addendum)
Spoke with MD about pt status. Pt is agitated at this time and yelling out. MD made aware about vital signs and increased blood pressure. MD advised to give another one time dose of Ativan and continue to monitor pt. Also discussed that pt may need lasix based on his chest xray. Advised MD that pt's urine output has been very high this shift compared to his intake. Will continue to monitor for need of Lasix and further intervention for blood pressure.

## 2014-09-23 NOTE — Progress Notes (Signed)
Patient very lethargic non verbal this morning. Urine output more than 3L since 7am. B/P high had 9 beat run of V-tach @ 0919 he was off unit at that time in CT Scan. Dr. Tana Coast aware and orders received. Transferring patient to [place]. Report called to [facility].

## 2014-09-23 NOTE — Progress Notes (Signed)
Patient not interacting/not speaking. Does look at me for a few seconds when I speak to him directly. Not oriented at all.

## 2014-09-23 NOTE — Progress Notes (Addendum)
ANTICOAGULATION CONSULT NOTE - Follow Up Consult  Pharmacy Consult for heparin Indication: hx of PE  Allergies  Allergen Reactions  . Tomato Other (See Comments)    Unknown reaction    Patient Measurements: Height: 5\' 5"  (165.1 cm) Weight: 256 lb 9.9 oz (116.4 kg) IBW/kg (Calculated) : 61.5 Heparin Dosing Weight: 88kg  Vital Signs: Temp: 98.2 F (36.8 C) (02/27 0915) Temp Source: Oral (02/27 0915) BP: 159/87 mmHg (02/27 1154) Pulse Rate: 79 (02/27 0915)  Labs:  Recent Labs  09/21/14 0500 09/22/14 0600 09/22/14 2325 09/23/14 0332 09/23/14 1307  HGB 8.8* 9.1*  --  8.9*  --   HCT 27.5* 28.5*  --  27.2*  --   PLT 232 228  --  169  --   LABPROT 33.2* 27.2* 18.9* 17.6*  --   INR 3.23* 2.50* 1.57* 1.43  --   HEPARINUNFRC  --   --   --   --  <0.10*  CREATININE 1.87* 2.31*  --  2.34*  --     Estimated Creatinine Clearance: 41.1 mL/min (by C-G formula based on Cr of 2.34).   Medications:  Infusions:  . sodium chloride 100 mL/hr at 09/23/14 0786  . heparin 1,000 Units/hr (09/23/14 0122)    Assessment: 59 yo M with hx of PE on chronic warfarin PTA. Planning for urology procedure Sunday 2/28, s.p Vit K INR 1.57. Pharmacy consulted to begin heparin, 6hr HL undetectable. Hgb 8.9, PLts 169. No bleeding noted.  Goal of Therapy:  Heparin level 0.3-0.7 units/ml Monitor platelets by anticoagulation protocol: Yes   Plan:  Increase heparin gtt to 1300 units/hr 6hr HL @ 2130 Monitor renal fxn andclinical status F/u tx to [facility]  Thank you for allowing pharmacy to be part of this patient's care team  [place], Pharm.D Clinical Pharmacy Resident Pager: [phone] 09/23/2014 .3:05 PM
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
## 2014-09-23 NOTE — Progress Notes (Signed)
Subjective: Patient has developed an acute encephalopathy. Evaluation is ongoing. CT head negative. Interestingly, his urine output has also picked up this morning and is quite brisk with about 3 L of urine out over the past few hours.    Objective: Vital signs in last 24 hours: Temp:  [97.9 F (36.6 C)-99.9 F (37.7 C)] 98.2 F (36.8 C) (02/27 0915) Pulse Rate:  [79-108] 79 (02/27 0915) Resp:  [16-32] 20 (02/27 0915) BP: (138-207)/(86-122) 159/87 mmHg (02/27 1154) SpO2:  [95 %-100 %] 95 % (02/27 0915)  Intake/Output from previous day: 02/26 0701 - 02/27 0700 In: 250 [P.O.:240; I.V.:10] Out: 2094 [BSJGG:8366; Stool:900] Intake/Output this shift: Total I/O In: -  Out: 3000 [Urine:3000]  Physical Exam:  Patient is in no acute distress but not alert or oriented. Abdomen is soft and nontender GU: Foley - copious clear urine output. I did irrigate the Foley to ensure there wasn't a partial obstruction or any debris in the tube and irrigated normally.   Lab Results:  Recent Labs  09/21/14 0500 09/22/14 0600 09/23/14 0332  HGB 8.8* 9.1* 8.9*  HCT 27.5* 28.5* 27.2*   BMET  Recent Labs  09/22/14 0600 09/23/14 0332  NA 142 146*  K 3.7 3.3*  CL 109 110  CO2 26 27  GLUCOSE 96 107*  BUN 19 19  CREATININE 2.31* 2.34*  CALCIUM 8.4 8.6    Recent Labs  09/22/14 0600 09/22/14 2325 09/23/14 0332  INR 2.50* 1.57* 1.43   No results for input(s): LABURIN in the last 72 hours. Results for orders placed or performed during the hospital encounter of 09/17/14  Urine culture     Status: None   Collection Time: 09/17/14  4:29 PM  Result Value Ref Range Status   Specimen Description URINE, CATHETERIZED  Final   Special Requests NONE  Final   Colony Count >=100,000 COLONIES/ML  Final   Culture   Final    ACINETOBACTER CALCOACETICUS/BAUMANNII COMPLEX KLEBSIELLA PNEUMONIAE Note: Confirmed Extended Spectrum Beta-Lactamase Producer (ESBL) CRITICAL RESULT CALLED TO, READ  BACK BY AND VERIFIED WITH: Carin Hock RN on 09/22/14 at 02:05 by Rise Mu    Report Status 09/22/2014 FINAL  Final   Organism ID, Bacteria ACINETOBACTER CALCOACETICUS/BAUMANNII COMPLEX  Final   Organism ID, Bacteria KLEBSIELLA PNEUMONIAE  Final      Susceptibility   Klebsiella pneumoniae - MIC*    AMPICILLIN >=32 RESISTANT Resistant     CEFAZOLIN >=64 RESISTANT Resistant     CEFTRIAXONE >=64 RESISTANT Resistant     CIPROFLOXACIN >=4 RESISTANT Resistant     GENTAMICIN >=16 RESISTANT Resistant     LEVOFLOXACIN >=8 RESISTANT Resistant     NITROFURANTOIN 256 RESISTANT Resistant     TOBRAMYCIN 2 SENSITIVE Sensitive     TRIMETH/SULFA >=320 RESISTANT Resistant     IMIPENEM <=0.25 SENSITIVE Sensitive     PIP/TAZO >=128 RESISTANT Resistant     * KLEBSIELLA PNEUMONIAE   Acinetobacter calcoaceticus/baumannii complex - MIC*    CEFTRIAXONE Value in next row Resistant      >=64 RESISTANTPerformed at [facility]    CIPROFLOXACIN Value in next row Resistant      >=4 RESISTANTPerformed at [facility]    GENTAMICIN Value in next row Sensitive      <=1 SENSITIVEPerformed at [facility]    PIP/TAZO Value in next row Resistant      >=128 RESISTANTPerformed at [facility]    TOBRAMYCIN Value in next row Sensitive      <=  1 SENSITIVEPerformed at [facility]    TRIMETH/SULFA Value in next row Resistant      >=320 RESISTANTPerformed at [facility]    LEVOFLOXACIN Value in next row Resistant      >=8 RESISTANTPerformed at [place] Value in next row Resistant      >=512 RESISTANTPerformed at [facility]    IMIPENEM Value in next row Resistant      >=16 RESISTANTPerformed at [place]  Blood Culture (routine x 2)     Status: None (Preliminary result)   Collection Time: 09/17/14  4:40 PM  Result Value Ref Range Status   Specimen Description BLOOD  RIGHT ARM  Final   Special Requests BOTTLES DRAWN AEROBIC AND ANAEROBIC 10CC  Final   Culture   Final           BLOOD CULTURE RECEIVED NO GROWTH TO DATE CULTURE WILL BE HELD FOR 5 DAYS BEFORE ISSUING A FINAL NEGATIVE REPORT Performed at [facility]    Report Status PENDING  Incomplete  Blood Culture (routine x 2)     Status: None (Preliminary result)   Collection Time: 09/17/14  4:55 PM  Result Value Ref Range Status   Specimen Description BLOOD LEFT HAND  Final   Special Requests BOTTLES DRAWN AEROBIC ONLY 5CC  Final   Culture   Final           BLOOD CULTURE RECEIVED NO GROWTH TO DATE CULTURE WILL BE HELD FOR 5 DAYS BEFORE ISSUING A FINAL NEGATIVE REPORT Performed at [facility]    Report Status PENDING  Incomplete  MRSA PCR Screening     Status: Abnormal   Collection Time: 09/17/14  8:10 PM  Result Value Ref Range Status   MRSA by PCR POSITIVE (A) NEGATIVE Final    Comment:        The GeneXpert MRSA Assay (FDA approved for NASAL specimens only), is one component of a comprehensive MRSA colonization surveillance program. It is not intended to diagnose MRSA infection nor to guide or monitor treatment for MRSA infections. RESULT CALLED TO, READ BACK BY AND VERIFIED WITH: PETTIFORD,A RN [phone] 09/18/14 MITCHELL,L   Culture, blood (routine x 2)     Status: None (Preliminary result)   Collection Time: 09/22/14 11:50 PM  Result Value Ref Range Status   Specimen Description BLOOD LEFT ARM  Final   Special Requests BOTTLES DRAWN AEROBIC AND ANAEROBIC 3CC  Final   Culture PENDING  Incomplete   Report Status PENDING  Incomplete  Culture, blood (routine x 2)     Status: None (Preliminary result)   Collection Time: 09/22/14 11:56 PM  Result Value Ref Range Status   Specimen Description BLOOD LEFT HAND  Final   Special Requests BOTTLES DRAWN AEROBIC ONLY 2CC  Final   Culture PENDING  Incomplete   Report Status PENDING  Incomplete    Studies/Results: Ct Head Wo  Contrast  09/23/2014   CLINICAL DATA:  Took Ativan to sleep last night. Not responding this morning. Altered mental status.  EXAM: CT HEAD WITHOUT CONTRAST  TECHNIQUE: Contiguous axial images were obtained from the base of the skull through the vertex without intravenous contrast.  COMPARISON:  12/07/2013  FINDINGS: Patient was uncooperative for the study which had to be repeated several times due to motion. No visible acute intracranial abnormality. No visible acute infarction. No hemorrhage or hydrocephalus. No extra-axial fluid collection. Paranasal  sinuses and mastoids are clear. No acute calvarial abnormality.  IMPRESSION: Typical, somewhat limited study due to patient motion. Study was repeated several times. No visible acute intracranial abnormality. With the multiple attempts, the study is felt to be diagnostic.   Electronically Signed   By: Rolm Baptise M.D.   On: 09/23/2014 11:12   Dg Chest Port 1 View  09/22/2014   CLINICAL DATA:  Respiratory distress.  EXAM: PORTABLE CHEST - 1 VIEW  COMPARISON:  09/20/2014  FINDINGS: Right PICC line remains in place, unchanged. Very low lung volumes with cardiomegaly and bibasilar atelectasis. No overt edema. No visible effusions. No acute bony abnormality.  IMPRESSION: Very low lung volumes with cardiomegaly and bibasilar atelectasis.   Electronically Signed   By: Rolm Baptise M.D.   On: 09/22/2014 20:49    Assessment/Plan: Acute renal failure, bilateral hydro--patient continues to have copious urine output. I'm not certain if kidney function is contributing to the current encephalopathy. Also not certain if nephrostomy tubes with provide any benefit in this situation given his urine output is brisk. Appreciate nephrology input.  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  Discussed with Dr. Tana Coast this morning x 2. Will follow.     LOS: 6 days   Samuel [name] 09/23/2014, 12:04  PM

## 2014-09-23 NOTE — Progress Notes (Addendum)
[facility] PROGRESS NOTE  Enzo Treu QZE:[id number] DOB: 18-Sep-1955 DOA: 09/17/2014 PCP: [name], MD  Interim Summary Patient is a pleasant 59 year old male with past medical history paraplegia, neurogenic bladder, chronic indwelling Foley catheter, status post left lower extremity amputation, presented as a transfer from his skilled nursing facility to the emergency department on 09/17/2014 with complaints of increasing cough and shortness of breath. Workup included a chest x-ray which revealed left lower lobe infiltrate. Initial lab showed white count of 13,600 which trended up to 20,000 on the following day. He was started on broad-spectrum IV antimicrobial therapy with meropenem and vancomycin for presumed healthcare associated pneumonia. Patient was also found to have acute kidney injury presenting with a creatinine 1.92. Previous lab work on 08/25/2014 showed a creatinine 1.0. He was started on IV fluids. Cultures are pending at the time of this dictation. Lab work drawn on 09/19/2014 showed overall improvement with his white count trending down to 10.2, creatinine trended down to 1.7 from 1.9. Renal ultrasound showed the presence of bilateral hydronephrosis. Patient was continuing to have hematuria. 2/27 patient noted to be acutely encephalopathic, not interacting at all, jerky movements. Overnight had a BP of 207/106    Assessment and plan   Acute encephalopathy unclear etiology  - patient noted to have malignant hypertension with acute encephalopathy, lethargic, jerky movements this morning at the time of my encounter. Stat CT head was negative for acute stroke or bleed. - No CO2 narcosis on ABG, ammonia level pending, patient had received Ativan at 2:45 AM, gave one dose of Romazicon, after that patient became alert and yelling - Neurology was consulted and Dr. Doy Mince did not feel this was neurological issue. EEG is still ordered. Patient is on Vimpat and lyrica  unclear if for seizure disorder. However, neurology felt it is more of a metabolic encephalopathy  - Discussed with urology, Dr. [name] as patient has pending TUR bladder tumor and stent due to worsening bilateral hydronephrosis however patient is making enormous amount of urine, noticed 18 L negative balance and about 3 L of urine over the past few hours. Discussed with nephrology service, Dr. Jonnie Finner who did not feel that nephrostomy tube would provide any benefit in this situation, will make final recommendations after evaluating the patient. - Antibiotics discontinued as per neurology this could be due to meropenem. Dr. [name] will follow patient and recommended to discontinue meropenem and no antibiotics for the time being, tobramycin in the perioperative period before and after the neurological procedure.  Seizure episode - Patient was noted to have a generalized tonic-clonic activity and 2mg  Ativan was given. Discussed with Dr. Doy Mince, recommended 1 g IV Depakote. Subsequently patient examined, in postictal phase, vital signs stable. Will obtain Depakote level in a.m.  Healthcare associated pneumonia/ left lower lobe infiltrates -Continue broad-spectrum IV antimicrobial therapy with meropenem and vancomycin -Blood cultures negative so far     Klebsiella and Acinetobacter UTI - Patient has a history of chronic Foley catheter due to neurogenic bladder, possibly has chronic colonization. - Culture and sensitivities reviewed, Klebsiella is sensitive to meropenem however Acinetobacter is not. Requested ID consult, Dr. [name] will see the patient for further recommendations regarding the antibiotics.   Acute kidney injury with bilateral hydronephrosis. -Patient presenting with an elevated creatinine of 1.9, having a normal kidney function on 08/25/2014, currently worsening despite IV fluids, Foley catheter which is draining normally -Increase IV fluids to 100 mL/ hour, discussed in detail  with urology, Dr. [name], see below  Hematuria/bilateral hydronephrosis possibly due to bladder wall thickening and ?tumor -Patient on chronic anticoagulation presented with hematuria initially felt to be secondary to recent Foley catheter exchange. -Renal ultrasound showed the presence of bilateral hydronephrosis, CT scan of the abdomen and pelvis showed moderate to severe left hydronephrosis with bladder wall thickening and possible tumor.  - continue heparin drip for now, urology following closely.      History of pulmonary embolism after surgery for left hip fracture in 2015. - Hold Coumadin and reports for the urological procedure - Patient had IVC filter placement in 09/22/2013 for large PE but subsequently was removed, confirmed per PCP notes, Dr. Dellia Nims (note on 09/05/14).  Reviewed PCP notes, patient was recommended lifelong Coumadin, possibly due to his multiple comorbidities and paraplegic state.   Essential hypertension Restarted home amlodipine.  Code Status: Full code  Family Communication:   Disposition Plan: Transfer to stepdown unit    Consultants:  Pharmacy  Antibiotics:  Vancomycin (started on 09/17/2014)  discontinued 2/26  Meropenem (started on 09/17/2014) discontinued  2/27  Subjective:  Patient seen and examined, lethargic, jerky movements, not responding to any verbal commands    Objective:  BP 159/87 mmHg  Pulse 79  Temp(Src) 98.2 F (36.8 C) (Oral)  Resp 20  Ht 5\' 5"  (1.651 m)  Wt 116.4 kg (256 lb 9.9 oz)  BMI 42.70 kg/m2  SpO2 95%   Intake/Output Summary (Last 24 hours) at 09/23/14 1232 Last data filed at 09/23/14 1127  Gross per 24 hour  Intake    250 ml  Output  10575 ml  Net -10325 ml   Filed Weights   09/17/14 2000  Weight: 116.4 kg (256 lb 9.9 oz)    Exam:   General: Lethargic  CVS: Regular rate and rhythm, normal S1 and S2.   Resp: Decreased breath sound at the bases otherwise no wheezing, rales or rhonchi    Abdomen: Obese, soft nontender nondistended, status post ostomy placement  Extremities: Status post left AKA   GU: Hematuria, Foley catheter  Data Reviewed: Basic Metabolic Panel:  Recent Labs Lab 09/19/14 0520 09/20/14 0602 09/21/14 0500 09/22/14 0600 09/23/14 0332  NA 143 141 143 142 146*  K 3.7 3.3* 3.2* 3.7 3.3*  CL 113* 112 108 109 110  CO2 24 25 26 26 27   GLUCOSE 118* 96 95 96 107*  BUN 19 14 19 19 19   CREATININE 1.73* 1.61* 1.87* 2.31* 2.34*  CALCIUM 7.6* 8.1* 8.6 8.4 8.6   Liver Function Tests:  Recent Labs Lab 09/17/14 1651 09/18/14 0309  AST 17 15  ALT 15 13  ALKPHOS 67 51  BILITOT 0.5 0.5  PROT 6.5 5.3*  ALBUMIN 3.3* 2.6*   No results for input(s): LIPASE, AMYLASE in the last 168 hours. No results for input(s): AMMONIA in the last 168 hours. CBC:  Recent Labs Lab 09/17/14 1651  09/19/14 0520 09/20/14 0602 09/21/14 0500 09/22/14 0600 09/23/14 0332  WBC 13.6*  < > 10.5 11.3* 10.3 10.5 10.0  NEUTROABS 11.7*  --   --   --   --   --   --   HGB 10.9*  < > 8.5* 9.0* 8.8* 9.1* 8.9*  HCT 34.6*  < > 27.4* 28.1* 27.5* 28.5* 27.2*  MCV 83.2  < > 85.6 82.4 83.3 81.2 80.5  PLT 271  < > 248 268 232 228 169  < > = values in this interval not displayed. Cardiac Enzymes: No results for input(s): CKTOTAL, CKMB, CKMBINDEX, TROPONINI in the  last 168 hours. BNP (last 3 results)  Recent Labs  09/17/14 1651  BNP 41.0    ProBNP (last 3 results)  Recent Labs  12/07/13 2221  PROBNP 56.9    CBG:  Recent Labs Lab 09/22/14 2014  GLUCAP 91    Recent Results (from the past 240 hour(s))  Urine culture     Status: None   Collection Time: 09/17/14  4:29 PM  Result Value Ref Range Status   Specimen Description URINE, CATHETERIZED  Final   Special Requests NONE  Final   Colony Count >=100,000 COLONIES/ML  Final   Culture   Final    ACINETOBACTER CALCOACETICUS/BAUMANNII COMPLEX KLEBSIELLA PNEUMONIAE Note: Confirmed Extended Spectrum Beta-Lactamase  Producer (ESBL) CRITICAL RESULT CALLED TO, READ BACK BY AND VERIFIED WITH: Carin Hock RN on 09/22/14 at 02:05 by Rise Mu    Report Status 09/22/2014 FINAL  Final   Organism ID, Bacteria ACINETOBACTER CALCOACETICUS/BAUMANNII COMPLEX  Final   Organism ID, Bacteria KLEBSIELLA PNEUMONIAE  Final      Susceptibility   Klebsiella pneumoniae - MIC*    AMPICILLIN >=32 RESISTANT Resistant     CEFAZOLIN >=64 RESISTANT Resistant     CEFTRIAXONE >=64 RESISTANT Resistant     CIPROFLOXACIN >=4 RESISTANT Resistant     GENTAMICIN >=16 RESISTANT Resistant     LEVOFLOXACIN >=8 RESISTANT Resistant     NITROFURANTOIN 256 RESISTANT Resistant     TOBRAMYCIN 2 SENSITIVE Sensitive     TRIMETH/SULFA >=320 RESISTANT Resistant     IMIPENEM <=0.25 SENSITIVE Sensitive     PIP/TAZO >=128 RESISTANT Resistant     * KLEBSIELLA PNEUMONIAE   Acinetobacter calcoaceticus/baumannii complex - MIC*    CEFTRIAXONE Value in next row Resistant      >=64 [facility]Performed at [facility]    CIPROFLOXACIN Value in next row Resistant      >=4 RESISTANTPerformed at [facility]    GENTAMICIN Value in next row Sensitive      <=1 SENSITIVEPerformed at [facility]    PIP/TAZO Value in next row Resistant      >=128 RESISTANTPerformed at [facility]    TOBRAMYCIN Value in next row Sensitive      <=1 SENSITIVEPerformed at [facility]    TRIMETH/SULFA Value in next row Resistant      >=320 RESISTANTPerformed at [facility]    LEVOFLOXACIN Value in next row Resistant      >=8 RESISTANTPerformed at [place] Value in next row Resistant      >=512 RESISTANTPerformed at [facility]    IMIPENEM Value in next row Resistant      >=16 RESISTANTPerformed at [facility]    * ACINETOBACTER CALCOACETICUS/BAUMANNII COMPLEX  Blood Culture (routine x 2)     Status: None (Preliminary result)   Collection Time: 09/17/14  4:40 PM  Result Value  Ref Range Status   Specimen Description BLOOD RIGHT ARM  Final   Special Requests BOTTLES DRAWN AEROBIC AND ANAEROBIC 10CC  Final   Culture   Final           BLOOD CULTURE RECEIVED NO GROWTH TO DATE CULTURE WILL BE HELD FOR 5 DAYS BEFORE ISSUING A FINAL NEGATIVE REPORT Performed at [facility]    Report Status PENDING  Incomplete  Blood Culture (routine x 2)     Status: None (Preliminary result)   Collection Time: 09/17/14  4:55 PM  Result Value Ref Range Status  Specimen Description BLOOD LEFT HAND  Final   Special Requests BOTTLES DRAWN AEROBIC ONLY 5CC  Final   Culture   Final           BLOOD CULTURE RECEIVED NO GROWTH TO DATE CULTURE WILL BE HELD FOR 5 DAYS BEFORE ISSUING A FINAL NEGATIVE REPORT Performed at [facility]    Report Status PENDING  Incomplete  MRSA PCR Screening     Status: Abnormal   Collection Time: 09/17/14  8:10 PM  Result Value Ref Range Status   MRSA by PCR POSITIVE (A) NEGATIVE Final    Comment:        The GeneXpert MRSA Assay (FDA approved for NASAL specimens only), is one component of a comprehensive MRSA colonization surveillance program. It is not intended to diagnose MRSA infection nor to guide or monitor treatment for MRSA infections. RESULT CALLED TO, READ BACK BY AND VERIFIED WITH: PETTIFORD,A RN [phone] 09/18/14 MITCHELL,L   Culture, blood (routine x 2)     Status: None (Preliminary result)   Collection Time: 09/22/14 11:50 PM  Result Value Ref Range Status   Specimen Description BLOOD LEFT ARM  Final   Special Requests BOTTLES DRAWN AEROBIC AND ANAEROBIC 3CC  Final   Culture PENDING  Incomplete   Report Status PENDING  Incomplete  Culture, blood (routine x 2)     Status: None (Preliminary result)   Collection Time: 09/22/14 11:56 PM  Result Value Ref Range Status   Specimen Description BLOOD LEFT HAND  Final   Special Requests BOTTLES DRAWN AEROBIC ONLY 2CC  Final   Culture PENDING  Incomplete   Report Status PENDING   Incomplete     Studies: Ct Head Wo Contrast  09/23/2014   CLINICAL DATA:  Took Ativan to sleep last night. Not responding this morning. Altered mental status.  EXAM: CT HEAD WITHOUT CONTRAST  TECHNIQUE: Contiguous axial images were obtained from the base of the skull through the vertex without intravenous contrast.  COMPARISON:  12/07/2013  FINDINGS: Patient was uncooperative for the study which had to be repeated several times due to motion. No visible acute intracranial abnormality. No visible acute infarction. No hemorrhage or hydrocephalus. No extra-axial fluid collection. Paranasal sinuses and mastoids are clear. No acute calvarial abnormality.  IMPRESSION: Typical, somewhat limited study due to patient motion. Study was repeated several times. No visible acute intracranial abnormality. With the multiple attempts, the study is felt to be diagnostic.   Electronically Signed   By: Rolm Baptise M.D.   On: 09/23/2014 11:12   Dg Chest Port 1 View  09/22/2014   CLINICAL DATA:  Respiratory distress.  EXAM: PORTABLE CHEST - 1 VIEW  COMPARISON:  09/20/2014  FINDINGS: Right PICC line remains in place, unchanged. Very low lung volumes with cardiomegaly and bibasilar atelectasis. No overt edema. No visible effusions. No acute bony abnormality.  IMPRESSION: Very low lung volumes with cardiomegaly and bibasilar atelectasis.   Electronically Signed   By: Rolm Baptise M.D.   On: 09/22/2014 20:49    Scheduled Meds: . antiseptic oral rinse  7 mL Mouth Rinse BID  . atorvastatin  10 mg Oral QHS  . baclofen  20 mg Oral 3 times per day  . fentaNYL  50 mcg Transdermal Q72H  . lamoTRIgine  200 mg Oral BID  . levothyroxine  62.5 mcg Intravenous QAC breakfast  . metoCLOPramide  5 mg Oral TID AC & HS  . metoprolol  5 mg Intravenous 3 times per day  .  oxybutynin  5 mg Oral Daily  . pantoprazole  40 mg Oral BID  . predniSONE  10 mg Oral Q breakfast  . pregabalin  75 mg Oral BID  . QUEtiapine  100 mg Oral BID  .  saccharomyces boulardii  250 mg Oral BID  . sodium chloride  3 mL Intravenous Q12H    Time spent 45 mins   RAI,RIPUDEEP M.D. [facility] 09/23/2014, 12:32 PM  Pager: [phone]
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
## 2014-09-23 NOTE — Progress Notes (Signed)
Patient noted to have generalized tonic-clonic movements with impaired breathing capacity.  Lips cyanotic during apparent seizure activity.  2 mg. Ativan given as per MD order with resulting resolution of seizure.  Dr. Tana Coast notified of patient's status.  She states she will consult with neurology as to course of action.   Pt. Noted to be biting on tongue, but no injury noted as of this assessment time.  Breathing labored with periods of apnea lasting 10 sec each.  Sats. WNL.  Daughter notified of change in patient's status at 19.

## 2014-09-23 NOTE — Progress Notes (Deleted)
Pt. Having non-purposeful movements.  Occasional clonic movements noted.  Pt. not able to follow commands.  Pupils are 38mm., fixed, and non-responsive at this time.

## 2014-09-23 NOTE — Progress Notes (Signed)
Pt is becoming more alert at this time. Pt is able to answer a few questions, and is oriented to self and place. Will continue to monitor.

## 2014-09-24 ENCOUNTER — Ambulatory Visit (HOSPITAL_COMMUNITY): Admission: RE | Admit: 2014-09-24 | Payer: Medicare Other | Source: Ambulatory Visit | Admitting: Urology

## 2014-09-24 ENCOUNTER — Encounter (HOSPITAL_COMMUNITY)
Admission: EM | Disposition: A | Discharge status: Skilled Nursing Facility | Payer: Self-pay | Source: Home / Self Care | Attending: Internal Medicine

## 2014-09-24 DIAGNOSIS — G9340 Encephalopathy, unspecified: Secondary | ICD-10-CM | POA: Insufficient documentation

## 2014-09-24 LAB — CBC
HEMATOCRIT: 25.7 % — AB (ref 39.0–52.0)
Hemoglobin: 8.6 g/dL — ABNORMAL LOW (ref 13.0–17.0)
MCH: 26.6 pg (ref 26.0–34.0)
MCHC: 33.5 g/dL (ref 30.0–36.0)
MCV: 79.6 fL (ref 78.0–100.0)
PLATELETS: 171 10*3/uL (ref 150–400)
RBC: 3.23 MIL/uL — AB (ref 4.22–5.81)
RDW: 15 % (ref 11.5–15.5)
WBC: 13.6 10*3/uL — AB (ref 4.0–10.5)

## 2014-09-24 LAB — CULTURE, BLOOD (ROUTINE X 2)
CULTURE: NO GROWTH
CULTURE: NO GROWTH

## 2014-09-24 LAB — HEPARIN LEVEL (UNFRACTIONATED)
HEPARIN UNFRACTIONATED: 0.49 [IU]/mL (ref 0.30–0.70)
Heparin Unfractionated: 0.1 IU/mL — ABNORMAL LOW (ref 0.30–0.70)
Heparin Unfractionated: 0.1 IU/mL — ABNORMAL LOW (ref 0.30–0.70)
Heparin Unfractionated: 0.38 IU/mL (ref 0.30–0.70)

## 2014-09-24 LAB — VALPROIC ACID LEVEL: Valproic Acid Lvl: <10 ug/mL — ABNORMAL LOW (ref 50.0–100.0)

## 2014-09-24 LAB — PROTIME-INR
INR: 1.25 (ref 0.00–1.49)
Prothrombin Time: 15.9 seconds — ABNORMAL HIGH (ref 11.6–15.2)

## 2014-09-24 SURGERY — TURBT (TRANSURETHRAL RESECTION OF BLADDER TUMOR)
Anesthesia: General

## 2014-09-24 MED ORDER — LORAZEPAM 2 MG/ML IJ SOLN: 1.0000 mg | Freq: Once | INTRAMUSCULAR | Status: DC

## 2014-09-24 MED ORDER — LORAZEPAM 2 MG/ML IJ SOLN
0.5000 mg | Freq: Once | INTRAMUSCULAR | Status: AC
  Administered 2014-09-24: 0.5 mg via INTRAVENOUS

## 2014-09-24 MED ORDER — LORAZEPAM 2 MG/ML IJ SOLN
2.0000 mg | Freq: Four times a day (QID) | INTRAMUSCULAR | Status: DC | PRN
  Administered 2014-09-26 – 2014-10-01 (×4): 2 mg via INTRAVENOUS
  Filled 2014-09-24 (×4): qty 1

## 2014-09-24 MED ORDER — ACETAMINOPHEN 10 MG/ML IV SOLN
1000.0000 mg | Freq: Once | INTRAVENOUS | Status: AC
  Administered 2014-09-24: 1000 mg via INTRAVENOUS
  Filled 2014-09-24: qty 100

## 2014-09-24 MED ORDER — METOPROLOL TARTRATE 1 MG/ML IV SOLN
2.5000 mg | Freq: Four times a day (QID) | INTRAVENOUS | Status: DC
  Administered 2014-09-24 – 2014-09-28 (×14): 2.5 mg via INTRAVENOUS
  Filled 2014-09-24 (×16): qty 5

## 2014-09-24 MED ORDER — HALOPERIDOL LACTATE 5 MG/ML IJ SOLN
0.5000 mg | Freq: Four times a day (QID) | INTRAMUSCULAR | Status: DC | PRN
  Administered 2014-09-24 – 2014-09-28 (×3): 1 mg via INTRAVENOUS
  Filled 2014-09-24 (×4): qty 1

## 2014-09-24 MED ORDER — HEPARIN BOLUS VIA INFUSION
2500.0000 [IU] | Freq: Once | INTRAVENOUS | Status: AC
  Administered 2014-09-24: 2500 [IU] via INTRAVENOUS
  Filled 2014-09-24: qty 2500

## 2014-09-24 MED ORDER — LORAZEPAM 2 MG/ML IJ SOLN: 1.0000 mg | Freq: Once | INTRAMUSCULAR | Status: AC

## 2014-09-24 MED ORDER — HEPARIN (PORCINE) IN NACL 100-0.45 UNIT/ML-% IJ SOLN
1650.0000 [IU]/h | INTRAMUSCULAR | Status: DC
  Administered 2014-09-24 – 2014-09-25 (×2): 1650 [IU]/h via INTRAVENOUS
  Filled 2014-09-24 (×4): qty 250

## 2014-09-24 MED ORDER — HEPARIN (PORCINE) IN NACL 100-0.45 UNIT/ML-% IJ SOLN
1600.0000 [IU]/h | INTRAMUSCULAR | Status: DC
  Administered 2014-09-24: 1600 [IU]/h via INTRAVENOUS
  Filled 2014-09-24 (×3): qty 250

## 2014-09-24 MED ORDER — HYDRALAZINE HCL 20 MG/ML IJ SOLN
20.0000 mg | Freq: Four times a day (QID) | INTRAMUSCULAR | Status: DC | PRN
  Administered 2014-09-24 (×2): 20 mg via INTRAVENOUS
  Filled 2014-09-24 (×3): qty 1

## 2014-09-24 MED ORDER — MORPHINE SULFATE 2 MG/ML IJ SOLN
INTRAMUSCULAR | Status: AC
  Filled 2014-09-24: qty 1

## 2014-09-24 MED ORDER — MORPHINE SULFATE 2 MG/ML IJ SOLN
1.0000 mg | Freq: Once | INTRAMUSCULAR | Status: AC
  Administered 2014-09-24: 1 mg via INTRAVENOUS

## 2014-09-24 NOTE — Progress Notes (Signed)
Md notified. Pt thrashing around in bed.  Yelling out bp 180/79.  Unable to go down to CT, unable to lay still.  New orders received. Will continue to monitor.  Saunders Revel T

## 2014-09-24 NOTE — Progress Notes (Signed)
Subjective: Patient improved today but still has some difficulty communicating.    Objective: Current vital signs: BP 163/85 mmHg  Pulse 106  Temp(Src) 99.1 F (37.3 C) (Axillary)  Resp 43  Ht 5\' 5"  (1.651 m)  Wt 112.8 kg (248 lb 10.9 oz)  BMI 41.38 kg/m2  SpO2 92% Vital signs in last 24 hours: Temp:  [99.1 F (37.3 C)-100.9 F (38.3 C)] 99.1 F (37.3 C) (02/28 0306) Pulse Rate:  [62-106] 106 (02/28 1357) Resp:  [19-43] 43 (02/28 1357) BP: (129-197)/(70-96) 163/85 mmHg (02/28 1357) SpO2:  [92 %-100 %] 92 % (02/28 1357) Weight:  [112.8 kg (248 lb 10.9 oz)-114.8 kg (253 lb 1.4 oz)] 112.8 kg (248 lb 10.9 oz) (02/28 0306)  Intake/Output from previous day: 02/27 0701 - 02/28 0700 In: 4264.8 [I.V.:3359.8; IV Piggyback:355] Out: 1194 [Urine:5700] Intake/Output this shift: Total I/O In: 545.5 [I.V.:545.5] Out: 650 [Urine:650] Nutritional status: Diet regular  Neurologic Exam: Mental Status: Alert. Patient sitting up in bed.  Will say some sentences fluently, seems to have an expressive aphasia otherwise with paraphasic errors and word substitutions.  Follows commands.  Cranial Nerves: II: Will not allow discs to be visualized. Pupils reactive  III,IV, VI: ptosis not present, extra-ocular motions grossly intact V,VII: grimace symmetric, intact corneals bilaterally VIII: hearing normal bilaterally IX,X: gag reflex present XI: bilateral shoulder shrug XII: tongue extension unable to be tested Motor: Able to lift both upper extremities above his head. No focal upper extremity weakness noted. No movement noted in the lower extremity. No myoclonus noted.  Sensory: Pinprick and light touch unable to be tested accurately Deep Tendon Reflexes: 2+ in the upper extremities and absent in the RLE Plantars: Right: muteLeft: unable to test Cerebellar: Unable to test Gait: Unable to test  Lab Results: Basic Metabolic Panel:  Recent Labs Lab  09/19/14 0520 09/20/14 0602 09/21/14 0500 09/22/14 0600 09/23/14 0332  NA 143 141 143 142 146*  K 3.7 3.3* 3.2* 3.7 3.3*  CL 113* 112 108 109 110  CO2 24 25 26 26 27   GLUCOSE 118* 96 95 96 107*  BUN 19 14 19 19 19   CREATININE 1.73* 1.61* 1.87* 2.31* 2.34*  CALCIUM 7.6* 8.1* 8.6 8.4 8.6    Liver Function Tests:  Recent Labs Lab 09/17/14 1651 09/18/14 0309 09/23/14 1307  AST 17 15 20   ALT 15 13 12   ALKPHOS 67 51 59  BILITOT 0.5 0.5 1.1  PROT 6.5 5.3* 7.0  ALBUMIN 3.3* 2.6* 3.3*   No results for input(s): LIPASE, AMYLASE in the last 168 hours.  Recent Labs Lab 09/23/14 1307  AMMONIA 22    CBC:  Recent Labs Lab 09/17/14 1651  09/20/14 0602 09/21/14 0500 09/22/14 0600 09/23/14 0332 09/24/14 0549  WBC 13.6*  < > 11.3* 10.3 10.5 10.0 13.6*  NEUTROABS 11.7*  --   --   --   --   --   --   HGB 10.9*  < > 9.0* 8.8* 9.1* 8.9* 8.6*  HCT 34.6*  < > 28.1* 27.5* 28.5* 27.2* 25.7*  MCV 83.2  < > 82.4 83.3 81.2 80.5 79.6  PLT 271  < > 268 232 228 169 171  < > = values in this interval not displayed.  Cardiac Enzymes: No results for input(s): CKTOTAL, CKMB, CKMBINDEX, TROPONINI in the last 168 hours.  Lipid Panel: No results for input(s): CHOL, TRIG, HDL, CHOLHDL, VLDL, LDLCALC in the last 168 hours.  CBG:  Recent Labs Lab 09/22/14 2014 09/23/14 1253  GLUCAP  44 101*    Microbiology: Results for orders placed or performed during the hospital encounter of 09/17/14  Urine culture     Status: None   Collection Time: 09/17/14  4:29 PM  Result Value Ref Range Status   Specimen Description URINE, CATHETERIZED  Final   Special Requests NONE  Final   Colony Count >=100,000 COLONIES/ML  Final   Culture   Final    ACINETOBACTER CALCOACETICUS/BAUMANNII COMPLEX KLEBSIELLA PNEUMONIAE Note: Confirmed Extended Spectrum Beta-Lactamase Producer (ESBL) CRITICAL RESULT CALLED TO, READ BACK BY AND VERIFIED WITH: Carin Hock RN on 09/22/14 at 02:05 by Rise Mu    Report  Status 09/22/2014 FINAL  Final   Organism ID, Bacteria ACINETOBACTER CALCOACETICUS/BAUMANNII COMPLEX  Final   Organism ID, Bacteria KLEBSIELLA PNEUMONIAE  Final      Susceptibility   Klebsiella pneumoniae - MIC*    AMPICILLIN >=32 RESISTANT Resistant     CEFAZOLIN >=64 RESISTANT Resistant     CEFTRIAXONE >=64 RESISTANT Resistant     CIPROFLOXACIN >=4 RESISTANT Resistant     GENTAMICIN >=16 RESISTANT Resistant     LEVOFLOXACIN >=8 RESISTANT Resistant     NITROFURANTOIN 256 RESISTANT Resistant     TOBRAMYCIN 2 SENSITIVE Sensitive     TRIMETH/SULFA >=320 RESISTANT Resistant     IMIPENEM <=0.25 SENSITIVE Sensitive     PIP/TAZO >=128 RESISTANT Resistant     * KLEBSIELLA PNEUMONIAE   Acinetobacter calcoaceticus/baumannii complex - MIC*    CEFTRIAXONE Value in next row Resistant      >=64 [facility]Performed at [facility]    CIPROFLOXACIN Value in next row Resistant      >=4 RESISTANTPerformed at [facility]    GENTAMICIN Value in next row Sensitive      <=1 SENSITIVEPerformed at [facility]    PIP/TAZO Value in next row Resistant      >=128 RESISTANTPerformed at [facility]    TOBRAMYCIN Value in next row Sensitive      <=1 SENSITIVEPerformed at [facility]    TRIMETH/SULFA Value in next row Resistant      >=320 RESISTANTPerformed at [facility]    LEVOFLOXACIN Value in next row Resistant      >=8 RESISTANTPerformed at [place] Value in next row Resistant      >=512 RESISTANTPerformed at [facility]    IMIPENEM Value in next row Resistant      >=16 RESISTANTPerformed at [place]  Blood Culture (routine x 2)     Status: None   Collection Time: 09/17/14  4:40 PM  Result Value Ref Range Status   Specimen Description BLOOD RIGHT ARM  Final   Special Requests BOTTLES DRAWN AEROBIC AND ANAEROBIC 10CC  Final   Culture   Final    NO  GROWTH 5 DAYS Performed at [facility]    Report Status 09/24/2014 FINAL  Final  Blood Culture (routine x 2)     Status: None   Collection Time: 09/17/14  4:55 PM  Result Value Ref Range Status   Specimen Description BLOOD LEFT HAND  Final   Special Requests BOTTLES DRAWN AEROBIC ONLY 5CC  Final   Culture   Final    NO GROWTH 5 DAYS Performed at [facility]    Report Status 09/24/2014 FINAL  Final  MRSA PCR Screening     Status: Abnormal   Collection Time: 09/17/14  8:10 PM  Result Value Ref Range Status   MRSA by PCR POSITIVE (A) NEGATIVE Final    Comment:        The GeneXpert MRSA Assay (FDA approved for NASAL specimens only), is one component of a comprehensive MRSA colonization surveillance program. It is not intended to diagnose MRSA infection nor to guide or monitor treatment for MRSA infections. RESULT CALLED TO, READ BACK BY AND VERIFIED WITH: PETTIFORD,A RN [phone] 09/18/14 MITCHELL,L   Culture, Urine     Status: None (Preliminary result)   Collection Time: 09/22/14  9:02 PM  Result Value Ref Range Status   Specimen Description URINE, CATHETERIZED  Final   Special Requests NONE  Final   Colony Count   Final    15,000 COLONIES/ML Performed at [facility]    Culture   Final    ENTEROCOCCUS SPECIES Performed at [facility]    Report Status PENDING  Incomplete  Culture, blood (routine x 2)     Status: None (Preliminary result)   Collection Time: 09/22/14 11:50 PM  Result Value Ref Range Status   Specimen Description BLOOD LEFT ARM  Final   Special Requests BOTTLES DRAWN AEROBIC AND ANAEROBIC 3CC  Final   Culture   Final           BLOOD CULTURE RECEIVED NO GROWTH TO DATE CULTURE WILL BE HELD FOR 5 DAYS BEFORE ISSUING A FINAL NEGATIVE REPORT Performed at [facility]    Report Status PENDING  Incomplete  Culture, blood (routine x 2)     Status: None (Preliminary result)   Collection Time: 09/22/14 11:56 PM  Result  Value Ref Range Status   Specimen Description BLOOD LEFT HAND  Final   Special Requests BOTTLES DRAWN AEROBIC ONLY 2CC  Final   Culture   Final           BLOOD CULTURE RECEIVED NO GROWTH TO DATE CULTURE WILL BE HELD FOR 5 DAYS BEFORE ISSUING A FINAL NEGATIVE REPORT Performed at [facility]    Report Status PENDING  Incomplete    Coagulation Studies:  Recent Labs  09/22/14 0600 09/22/14 2325 09/23/14 0332 09/24/14 0549  LABPROT 27.2* 18.9* 17.6* 15.9*  INR 2.50* 1.57* 1.43 1.25    Imaging: Ct Head Wo Contrast  09/23/2014   CLINICAL DATA:  Took Ativan to sleep last night. Not responding this morning. Altered mental status.  EXAM: CT HEAD WITHOUT CONTRAST  TECHNIQUE: Contiguous axial images were obtained from the base of the skull through the vertex without intravenous contrast.  COMPARISON:  12/07/2013  FINDINGS: Patient was uncooperative for the study which had to be repeated several times due to motion. No visible acute intracranial abnormality. No visible acute infarction. No hemorrhage or hydrocephalus. No extra-axial fluid collection. Paranasal sinuses and mastoids are clear. No acute calvarial abnormality.  IMPRESSION: Typical, somewhat limited study due to patient motion. Study was repeated several times. No visible acute intracranial abnormality. With the multiple attempts, the study is felt to be diagnostic.   Electronically Signed   By: Rolm Baptise M.D.   On: 09/23/2014 11:12   Dg Chest Port 1 View  09/22/2014   CLINICAL DATA:  Respiratory distress.  EXAM: PORTABLE CHEST - 1 VIEW  COMPARISON:  09/20/2014  FINDINGS: Right PICC line remains in place, unchanged. Very low lung volumes with cardiomegaly and bibasilar atelectasis. No overt edema. No visible effusions. No acute bony abnormality.  IMPRESSION: Very low lung volumes with cardiomegaly and bibasilar atelectasis.   Electronically  Signed   By: Rolm Baptise M.D.   On: 09/22/2014 20:49    Medications:  I have  reviewed the patient's current medications. Scheduled: . antiseptic oral rinse  7 mL Mouth Rinse BID  . atorvastatin  10 mg Oral QHS  . lamoTRIgine  200 mg Oral BID  . levothyroxine  62.5 mcg Intravenous QAC breakfast  . metoprolol  2.5 mg Intravenous 4 times per day  . oxybutynin  5 mg Oral Daily  . pantoprazole (PROTONIX) IV  40 mg Intravenous Q12H  . predniSONE  10 mg Oral Q breakfast  . QUEtiapine  100 mg Oral BID  . saccharomyces boulardii  250 mg Oral BID  . sodium chloride  3 mL Intravenous Q12H  . valproate sodium  500 mg Intravenous 3 times per day    Assessment/Plan: Although patient not back to baseline, he is improved.  Merrem discontinued.  Renal function remains impaired and white blood cell count elevated.    Recommendations: 1.  Will continue to follow with you   LOS: 7 days   Alexis Goodell, MD [facility] [phone] 09/24/2014  3:07 PM

## 2014-09-24 NOTE — Progress Notes (Signed)
Patient spoke with daughter, [name], on the phone.  Was cooperative and calm during conversation, however after daughter hung up, he became increasingly belligerent and upset.  Talking to himself.  Attempted to give nourishment, but he refused.

## 2014-09-24 NOTE — Progress Notes (Signed)
Pt. Having non-purposeful movements. Occasional clonic movements noted. Pt. not able to follow commands. Pupils are 68mm., fixed, and non-responsive at this time.

## 2014-09-24 NOTE — Progress Notes (Signed)
Patient more alert.  Remains disoriented x 3 and has apraxia.  Daughter, [name], notified of his current status and states her sister, [name], will be by today.  This was relayed to patient.  Patient having episodes of 20-30 minutes of sleep followed by equal episodes of restlessness / confusion.  Attempts to reorient show minimal progress, though considerably better than previous shift.  He is able to speak in simple sentences.

## 2014-09-24 NOTE — Progress Notes (Signed)
[facility] PROGRESS NOTE  Samuel [name] YJE:[id number] DOB: [birth year] DOA: 09/17/2014 PCP: Samuel Brightly, MD  Interim Summary Patient is a pleasant 59 year old male with past medical history paraplegia, neurogenic bladder, chronic indwelling Foley catheter, status post left lower extremity amputation, presented as a transfer from his skilled nursing facility to the emergency department on 09/17/2014 with complaints of increasing cough and shortness of breath. Workup included a chest x-ray which revealed left lower lobe infiltrate. Initial lab showed white count of 13,600 which trended up to 20,000 on the following day. He was started on broad-spectrum IV antimicrobial therapy with meropenem and vancomycin for presumed healthcare associated pneumonia. Patient was also found to have acute kidney injury presenting with a creatinine 1.92. Previous lab work on 08/25/2014 showed a creatinine 1.0. He was started on IV fluids. Cultures are pending at the time of this dictation. Lab work drawn on 09/19/2014 showed overall improvement with his white count trending down to 10.2, creatinine trended down to 1.7 from 1.9. Renal ultrasound showed the presence of bilateral hydronephrosis. Patient was continuing to have hematuria. 2/27 patient noted to be acutely encephalopathic, not interacting at all, jerky movements. Overnight had a BP of 207/106    Assessment and plan   Acute encephalopathy likely due to seizure  - improving much more alert and awake today  - workup so far negative, CT head negative EEG pending - Patient's mental status somewhat better today, meropenem was discontinued - Was placed on Depakote yesterday after tonic-clonic seizure, continue Lamictal, neurology following  Seizure episode - No further seizures overnight, continue Depakote, Lamictal.  - EEG pending, neurology following.  Healthcare associated pneumonia/ left lower lobe infiltrates -All antibiotics discontinued  after yesterday's episode, ID following. Patient has received appropriate course of meropenem for HCAP, has urinary colonization with drug-resistant Klebsiella and Acinetobacter.  -Blood cultures negative so far     Klebsiella and Acinetobacter UTI - Patient has a history of chronic Foley catheter due to neurogenic bladder,has chronic colonization. - Meropenem has been discontinued. ID, Samuel. Megan [name] recommended pharmacy to give preoperative dose of tobramycin before his upcoming urological procedure.   Acute kidney injury with bilateral hydronephrosis. -BMET pending, nephrology and urology following    Hematuria/bilateral hydronephrosis possibly due to bladder wall thickening and ?tumor- currently hematuria clear -Patient on chronic anticoagulation presented with hematuria initially felt to be secondary to recent Foley catheter exchange. -Renal ultrasound showed the presence of bilateral hydronephrosis, CT scan of the abdomen and pelvis showed moderate to severe left hydronephrosis with bladder wall thickening and possible tumor.  - Discussed with Samuel. Junious [name] this morning, rec'd will keep on schedule for TURBT for bladder tumor and possibly ureteral stent on Tuesday. Recommended to transfer patient to [facility].     History of pulmonary embolism after surgery for left hip fracture in 2015. - Holding Coumadin for urological procedure, continue IV heparin drip - Patient had IVC filter placement in 09/22/2013 for large PE but subsequently was removed, confirmed per PCP notes, Samuel. Dellia Nims (note on 09/05/14).  Reviewed PCP notes, patient was recommended lifelong Coumadin, possibly due to his multiple comorbidities and paraplegic state.   Essential hypertension Restarted home amlodipine.  Code Status: Full code  Family Communication: called patient's both daughters unable to make contact with any of them. I left a detailed message on Ms. Samuel [name]'s phone    Disposition Plan:  Continue stepdown unit   Consultants:  ID- Samuel Samuel [name]  Renal: Samuel Samuel [name]  Urology: Samuel Samuel [name]  Neurology:  Samuel [name]  Antibiotics:  Vancomycin (started on 09/17/2014)  discontinued 2/26  Meropenem (started on 09/17/2014) discontinued  2/27  Subjective:  Patient seen and examined, no repeat seizures overnight, patient more alert and awake and coherent today, still some jerky movements of the arms   Objective:  BP 194/85 mmHg  Pulse 62  Temp(Src) 99.1 F (37.3 C) (Axillary)  Resp 20  Ht 5\' 5"  (1.651 m)  Wt 112.8 kg (248 lb 10.9 oz)  BMI 41.38 kg/m2  SpO2 99%   Intake/Output Summary (Last 24 hours) at 09/24/14 1108 Last data filed at 09/24/14 1019  Gross per 24 hour  Intake 4810.31 ml  Output   4450 ml  Net 360.31 ml   Filed Weights   09/17/14 2000 09/23/14 1700 09/24/14 0306  Weight: 116.4 kg (256 lb 9.9 oz) 114.8 kg (253 lb 1.4 oz) 112.8 kg (248 lb 10.9 oz)    Exam:   General: Alert and awake, still somewhat confused however mental status better from yesterday, following commands  CVS: Regular rate and rhythm, normal S1 and S2.   Resp: Decreased breath sound at the bases otherwise no wheezing, rales or rhonchi   Abdomen: Obese, soft nontender nondistended, status post ostomy placement  Extremities: Status post left AKA   GU: Hematuria, Foley catheter  Data Reviewed: Basic Metabolic Panel:  Recent Labs Lab 09/19/14 0520 09/20/14 0602 09/21/14 0500 09/22/14 0600 09/23/14 0332  NA 143 141 143 142 146*  K 3.7 3.3* 3.2* 3.7 3.3*  CL 113* 112 108 109 110  CO2 24 25 26 26 27   GLUCOSE 118* 96 95 96 107*  BUN 19 14 19 19 19   CREATININE 1.73* 1.61* 1.87* 2.31* 2.34*  CALCIUM 7.6* 8.1* 8.6 8.4 8.6   Liver Function Tests:  Recent Labs Lab 09/17/14 1651 09/18/14 0309 09/23/14 1307  AST 17 15 20   ALT 15 13 12   ALKPHOS 67 51 59  BILITOT 0.5 0.5 1.1  PROT 6.5 5.3* 7.0  ALBUMIN 3.3* 2.6* 3.3*   No results for input(s): LIPASE, AMYLASE  in the last 168 hours.  Recent Labs Lab 09/23/14 1307  AMMONIA 22   CBC:  Recent Labs Lab 09/17/14 1651  09/20/14 0602 09/21/14 0500 09/22/14 0600 09/23/14 0332 09/24/14 0549  WBC 13.6*  < > 11.3* 10.3 10.5 10.0 13.6*  NEUTROABS 11.7*  --   --   --   --   --   --   HGB 10.9*  < > 9.0* 8.8* 9.1* 8.9* 8.6*  HCT 34.6*  < > 28.1* 27.5* 28.5* 27.2* 25.7*  MCV 83.2  < > 82.4 83.3 81.2 80.5 79.6  PLT 271  < > 268 232 228 169 171  < > = values in this interval not displayed. Cardiac Enzymes: No results for input(s): CKTOTAL, CKMB, CKMBINDEX, TROPONINI in the last 168 hours. BNP (last 3 results)  Recent Labs  09/17/14 1651  BNP 41.0    ProBNP (last 3 results)  Recent Labs  12/07/13 2221  PROBNP 56.9    CBG:  Recent Labs Lab 09/22/14 2014 09/23/14 1253  GLUCAP 91 101*    Recent Results (from the past 240 hour(s))  Urine culture     Status: None   Collection Time: 09/17/14  4:29 PM  Result Value Ref Range Status   Specimen Description URINE, CATHETERIZED  Final   Special Requests NONE  Final   Colony Count >=100,000 COLONIES/ML  Final   Culture   Final    ACINETOBACTER CALCOACETICUS/BAUMANNII  COMPLEX KLEBSIELLA PNEUMONIAE Note: Confirmed Extended Spectrum Beta-Lactamase Producer (ESBL) CRITICAL RESULT CALLED TO, READ BACK BY AND VERIFIED WITH: Carin Hock RN on 09/22/14 at 02:05 by Rise Mu    Report Status 09/22/2014 FINAL  Final   Organism ID, Bacteria ACINETOBACTER CALCOACETICUS/BAUMANNII COMPLEX  Final   Organism ID, Bacteria KLEBSIELLA PNEUMONIAE  Final      Susceptibility   Klebsiella pneumoniae - MIC*    AMPICILLIN >=32 RESISTANT Resistant     CEFAZOLIN >=64 RESISTANT Resistant     CEFTRIAXONE >=64 RESISTANT Resistant     CIPROFLOXACIN >=4 RESISTANT Resistant     GENTAMICIN >=16 RESISTANT Resistant     LEVOFLOXACIN >=8 RESISTANT Resistant     NITROFURANTOIN 256 RESISTANT Resistant     TOBRAMYCIN 2 SENSITIVE Sensitive     TRIMETH/SULFA >=320  RESISTANT Resistant     IMIPENEM <=0.25 SENSITIVE Sensitive     PIP/TAZO >=128 RESISTANT Resistant     * KLEBSIELLA PNEUMONIAE   Acinetobacter calcoaceticus/baumannii complex - MIC*    CEFTRIAXONE Value in next row Resistant      >=64 RESISTANTPerformed at [facility]    CIPROFLOXACIN Value in next row Resistant      >=4 RESISTANTPerformed at [facility]    GENTAMICIN Value in next row Sensitive      <=1 SENSITIVEPerformed at [facility]    PIP/TAZO Value in next row Resistant      >=128 RESISTANTPerformed at [facility]    TOBRAMYCIN Value in next row Sensitive      <=1 SENSITIVEPerformed at [facility]    TRIMETH/SULFA Value in next row Resistant      >=320 RESISTANTPerformed at [facility]    LEVOFLOXACIN Value in next row Resistant      >=8 RESISTANTPerformed at [place] Value in next row Resistant      >=512 RESISTANTPerformed at [facility]    IMIPENEM Value in next row Resistant      >=16 RESISTANTPerformed at [place]  Blood Culture (routine x 2)     Status: None   Collection Time: 09/17/14  4:40 PM  Result Value Ref Range Status   Specimen Description BLOOD RIGHT ARM  Final   Special Requests BOTTLES DRAWN AEROBIC AND ANAEROBIC 10CC  Final   Culture   Final    NO GROWTH 5 DAYS Performed at [facility]    Report Status 09/24/2014 FINAL  Final  Blood Culture (routine x 2)     Status: None   Collection Time: 09/17/14  4:55 PM  Result Value Ref Range Status   Specimen Description BLOOD LEFT HAND  Final   Special Requests BOTTLES DRAWN AEROBIC ONLY 5CC  Final   Culture   Final    NO GROWTH 5 DAYS Performed at [facility]    Report Status 09/24/2014 FINAL  Final  MRSA PCR Screening     Status: Abnormal   Collection Time: 09/17/14  8:10 PM  Result Value Ref Range Status   MRSA by PCR POSITIVE (A)  NEGATIVE Final    Comment:        The GeneXpert MRSA Assay (FDA approved for NASAL specimens only), is one component of a comprehensive MRSA colonization surveillance program. It is not intended to diagnose MRSA infection nor to guide or monitor treatment for MRSA infections. RESULT CALLED TO, READ BACK BY AND VERIFIED WITH: PETTIFORD,A RN [phone]  09/18/14 MITCHELL,L   Culture, blood (routine x 2)     Status: None (Preliminary result)   Collection Time: 09/22/14 11:50 PM  Result Value Ref Range Status   Specimen Description BLOOD LEFT ARM  Final   Special Requests BOTTLES DRAWN AEROBIC AND ANAEROBIC 3CC  Final   Culture   Final           BLOOD CULTURE RECEIVED NO GROWTH TO DATE CULTURE WILL BE HELD FOR 5 DAYS BEFORE ISSUING A FINAL NEGATIVE REPORT Performed at [facility]    Report Status PENDING  Incomplete  Culture, blood (routine x 2)     Status: None (Preliminary result)   Collection Time: 09/22/14 11:56 PM  Result Value Ref Range Status   Specimen Description BLOOD LEFT HAND  Final   Special Requests BOTTLES DRAWN AEROBIC ONLY 2CC  Final   Culture   Final           BLOOD CULTURE RECEIVED NO GROWTH TO DATE CULTURE WILL BE HELD FOR 5 DAYS BEFORE ISSUING A FINAL NEGATIVE REPORT Performed at [facility]    Report Status PENDING  Incomplete     Studies: Ct Head Wo Contrast  09/23/2014   CLINICAL DATA:  Took Ativan to sleep last night. Not responding this morning. Altered mental status.  EXAM: CT HEAD WITHOUT CONTRAST  TECHNIQUE: Contiguous axial images were obtained from the base of the skull through the vertex without intravenous contrast.  COMPARISON:  12/07/2013  FINDINGS: Patient was uncooperative for the study which had to be repeated several times due to motion. No visible acute intracranial abnormality. No visible acute infarction. No hemorrhage or hydrocephalus. No extra-axial fluid collection. Paranasal sinuses and mastoids are clear. No acute calvarial  abnormality.  IMPRESSION: Typical, somewhat limited study due to patient motion. Study was repeated several times. No visible acute intracranial abnormality. With the multiple attempts, the study is felt to be diagnostic.   Electronically Signed   By: Rolm Baptise M.D.   On: 09/23/2014 11:12   Dg Chest Port 1 View  09/22/2014   CLINICAL DATA:  Respiratory distress.  EXAM: PORTABLE CHEST - 1 VIEW  COMPARISON:  09/20/2014  FINDINGS: Right PICC line remains in place, unchanged. Very low lung volumes with cardiomegaly and bibasilar atelectasis. No overt edema. No visible effusions. No acute bony abnormality.  IMPRESSION: Very low lung volumes with cardiomegaly and bibasilar atelectasis.   Electronically Signed   By: Rolm Baptise M.D.   On: 09/22/2014 20:49    Scheduled Meds: . antiseptic oral rinse  7 mL Mouth Rinse BID  . atorvastatin  10 mg Oral QHS  . lamoTRIgine  200 mg Oral BID  . levothyroxine  62.5 mcg Intravenous QAC breakfast  . metoprolol  2.5 mg Intravenous 4 times per day  . oxybutynin  5 mg Oral Daily  . pantoprazole (PROTONIX) IV  40 mg Intravenous Q12H  . predniSONE  10 mg Oral Q breakfast  . QUEtiapine  100 mg Oral BID  . saccharomyces boulardii  250 mg Oral BID  . sodium chloride  3 mL Intravenous Q12H  . valproate sodium  500 mg Intravenous 3 times per day    Time spent 35 mins   Koralynn Greenspan M.D. [facility] 09/24/2014, 11:08 AM  Pager: [phone]

## 2014-09-24 NOTE — Progress Notes (Addendum)
ANTICOAGULATION CONSULT NOTE - Follow Up Consult  Pharmacy Consult for heparin Indication: hx of PE  Allergies  Allergen Reactions  . Tomato Other (See Comments)    Unknown reaction    Patient Measurements: Height: 5\' 5"  (165.1 cm) Weight: 248 lb 10.9 oz (112.8 kg) IBW/kg (Calculated) : 61.5 Heparin Dosing Weight: 87.6kg  Vital Signs: Temp: 99.1 F (37.3 C) (02/28 0306) Temp Source: Axillary (02/28 0306) BP: 129/87 mmHg (02/28 0400) Pulse Rate: 62 (02/28 0306)  Labs:  Recent Labs  09/22/14 0600 09/22/14 2325 09/23/14 0332 09/23/14 1307 09/24/14 0145 09/24/14 0549 09/24/14 0720  HGB 9.1*  --  8.9*  --   --  8.6*  --   HCT 28.5*  --  27.2*  --   --  25.7*  --   PLT 228  --  169  --   --  171  --   LABPROT 27.2* 18.9* 17.6*  --   --  15.9*  --   INR 2.50* 1.57* 1.43  --   --  1.25  --   HEPARINUNFRC  --   --   --  <0.10* 0.10*  --  0.49  CREATININE 2.31*  --  2.34*  --   --   --   --     Estimated Creatinine Clearance: 40.4 mL/min (by C-G formula based on Cr of 2.34).   Medications:  Infusions:  . dextrose 150 mL (09/24/14 0146)  . heparin 1,600 Units/hr (09/24/14 0305)    Assessment: 58yoM presented [facility] with cough and SOB. On heparin gtt for hx of PE. HL therapeutic this morning at 0.49. Hgb 8.9, PLTC 171. No bleeding noted, no issue per RN.   Goal of Therapy:  Heparin level 0.3-0.7 units/ml Monitor platelets by anticoagulation protocol: Yes   Plan: Continue heparin gtt at 1600 units/hr 6hr HL to confirm at 1330 Monitor renal fxn and clinical status F/u tx to [facility] and preop tobramycin per ID  Thank you for allowing pharmacy to be part of this patient's care team  [place], Pharm.D Clinical Pharmacy Resident Pager: [phone] 09/24/2014 .9:39 AM    ___________________________________ ADDENDUM  6hr HL to confirm subtherapeutic. After speaking with RN she states that the line was for "out" for unknown time this morning.  I suspect this is why it has gone from therapeutic to subtherapeutic. No bleeding noted.  Plan: Continue heparin gtt at 1600 units/hr 6hr HL at 1900 Monitor s/sx of bleeding Daily HL/CBC  Thank you for allowing pharmacy to be part of this patient's care team  [place], Pharm.D Clinical Pharmacy Resident Pager: [phone] 09/24/2014 .2:03 PM

## 2014-09-24 NOTE — Progress Notes (Signed)
Patient states, "I want to commit suicide", and "I want to kill myself".  Pulled IV pole onto floor, causing glass to chard on floor.  Pt. Restless and anxious.  Mitts placed on both hands for patient's safety.  Pt. Pulled out left shoulder IV where heparin was infusing.  Attempting to pull out PICC line.  Reported to charge nurse and to MD.

## 2014-09-24 NOTE — Progress Notes (Addendum)
[facility] Progress Note   Subjective: More alert today, no further seizures, answering questions appropriately  Filed Vitals:   09/24/14 0100 09/24/14 0200 09/24/14 0306 09/24/14 0400  BP:  159/81 142/70 129/87  Pulse: 96 82 62   Temp:   99.1 F (37.3 C)   TempSrc:   Axillary   Resp: 23 25 19 20   Height:   5\' 5"  (1.651 m)   Weight:   112.8 kg (248 lb 10.9 oz)   SpO2: 94% 96% 100% 99%   Exam: Pt lethargic today, awakens easily, Ox 3 Neck veins flat Chest clear bilat RRR no MRG Abd obese, multiple healed scars, LLQ ostomy L leg high amputation 1+ pitting edema RLE Indwelling foley w clear urine in bag Neuro nonfocal, Ox 3  CXR left infiltrate retrocardiac CT abd - ^'density in bladder / bladder wall suggesting tumor; mod/severe bilat hydro, likely tumor obstruction Renal US bilat moderate hydro, normal kidney size and echo UA 2/21 > 1.010, 7.0, 100 prot, 3-6 wbc/hpf, tntc RBC/hpf, many bact ECHO normal LVEF  Assessment: 1. Acute renal failure - due to bilat hydronephrosis. Baseline creat 1.0 on 08/25/14 2. Bladder mass - due for biopsy/ procedure soon 3. Polyuria / hypernatremia - polyuria a bit better, possible neph DI from obstruction 4. HTN - was on norvasc only at home, getting tid IV MTP, BP's good 5. Volume - mild LE edema only 6. AMS - resolved, keep off narcotics, Lyrica, Baclofen for now 7. Paraplegic 8. Indwelling Foley  Plan - lab pending, cont D5W for now, urology planning for procedure on Tuesday; if unable to place internal stents will need PCN's.  Nothing further to add for now from renal standpoint.  Will sign off.     Kelly Splinter MD  pager [phone]    cell [phone]  09/24/2014, 10:17 AM     Recent Labs Lab 09/21/14 0500 09/22/14 0600 09/23/14 0332  NA 143 142 146*  K 3.2* 3.7 3.3*  CL 108 109 110  CO2 26 26 27   GLUCOSE 95 96 107*  BUN 19 19 19   CREATININE 1.87* 2.31* 2.34*  CALCIUM 8.6 8.4 8.6    Recent Labs Lab  09/17/14 1651 09/18/14 0309 09/23/14 1307  AST 17 15 20   ALT 15 13 12   ALKPHOS 67 51 59  BILITOT 0.5 0.5 1.1  PROT 6.5 5.3* 7.0  ALBUMIN 3.3* 2.6* 3.3*    Recent Labs Lab 09/17/14 1651  09/22/14 0600 09/23/14 0332 09/24/14 0549  WBC 13.6*  < > 10.5 10.0 13.6*  NEUTROABS 11.7*  --   --   --   --   HGB 10.9*  < > 9.1* 8.9* 8.6*  HCT 34.6*  < > 28.5* 27.2* 25.7*  MCV 83.2  < > 81.2 80.5 79.6  PLT 271  < > 228 169 171  < > = values in this interval not displayed. Marland Kitchen antiseptic oral rinse  7 mL Mouth Rinse BID  . atorvastatin  10 mg Oral QHS  . lamoTRIgine  200 mg Oral BID  . levothyroxine  62.5 mcg Intravenous QAC breakfast  . metoprolol  2.5 mg Intravenous 4 times per day  . oxybutynin  5 mg Oral Daily  . pantoprazole (PROTONIX) IV  40 mg Intravenous Q12H  . predniSONE  10 mg Oral Q breakfast  . QUEtiapine  100 mg Oral BID  . saccharomyces boulardii  250 mg Oral BID  . sodium chloride  3 mL Intravenous Q12H  . valproate sodium  500 mg Intravenous 3 times per day   . dextrose 150 mL (09/24/14 0146)  . heparin 1,600 Units/hr (09/24/14 0305)   acetaminophen **OR** acetaminophen, albuterol, calcium carbonate, gi cocktail, haloperidol lactate, hydrALAZINE, LORazepam, ondansetron **OR** ondansetron (ZOFRAN) IV, polyvinyl alcohol, sodium chloride

## 2014-09-24 NOTE — Progress Notes (Addendum)
ANTICOAGULATION CONSULT NOTE - FOLLOW UP    HL = 0.38 (goal 0.3 - 0.7 units/mL) Heparin dosing weight = 88 kg   Assessment: 58 YOM continues on IV heparin for history of PE.  Heparin level therapeutic.  RN reported minimal bleeding at foley site, not concerning.   Plan: - Increase heparin gtt slightly to 1650 units/hr - F/U AM labs and s/sx of bleeding    Samuel [name] D. Mina Marble, PharmD, BCPS 09/24/2014, 9:11 PM

## 2014-09-24 NOTE — Progress Notes (Signed)
Continue to be unable to obtain CT scan of abdomen due to patient's agitation and restlessness.  States, " I want to die, I want to die!"  States now he does not want the surgery scheduled for Tuesday.  He states he wants to leave.  Attempted to call daughter, [name], who is suppose to visit him today.  Message left on answering service.

## 2014-09-24 NOTE — Progress Notes (Signed)
ANTICOAGULATION CONSULT NOTE - Follow Up Consult  Pharmacy Consult for heparin Indication: hx of PE  Allergies  Allergen Reactions  . Tomato Other (See Comments)    Unknown reaction    Patient Measurements: Height: 5\' 5"  (165.1 cm) Weight: 253 lb 1.4 oz (114.8 kg) IBW/kg (Calculated) : 61.5 Heparin Dosing Weight: 88kg  Vital Signs: Temp: 100.9 F (38.3 C) (02/28 0000) Temp Source: Axillary (02/28 0000) BP: 180/79 mmHg (02/28 0000) Pulse Rate: 96 (02/28 0100)  Labs:  Recent Labs  09/21/14 0500 09/22/14 0600 09/22/14 2325 09/23/14 0332 09/23/14 1307 09/24/14 0145  HGB 8.8* 9.1*  --  8.9*  --   --   HCT 27.5* 28.5*  --  27.2*  --   --   PLT 232 228  --  169  --   --   LABPROT 33.2* 27.2* 18.9* 17.6*  --   --   INR 3.23* 2.50* 1.57* 1.43  --   --   HEPARINUNFRC  --   --   --   --  <0.10* 0.10*  CREATININE 1.87* 2.31*  --  2.34*  --   --     Estimated Creatinine Clearance: 40.8 mL/min (by C-G formula based on Cr of 2.34).   Medications:  Infusions:  . sodium chloride 100 mL/hr at 09/23/14 7412  . dextrose 150 mL (09/24/14 0146)  . heparin 1,300 Units/hr (09/23/14 2135)    Assessment: Heparin level is 0.1 on heparin drip 1300 units/hr. 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 Planning for urology procedure Sunday 2/28, s.p Vit K  and coumadin on hold.  RN reports no problems with heparin infusion.  Some oozing around penis due to foley trauma but no other bleeding noted per RN.    Goal of Therapy:  Heparin level 0.3-0.7 units/ml Monitor platelets by anticoagulation protocol: Yes   Plan:  Give heparin 2500 units IV bolus x1 Increase heparin gtt to 1600 units/hr Check 6hr HL @ 0900  Monitor renal fxn and clinical status F/u tx to [facility]  Thank you for allowing pharmacy to be part of this patient's care team  Nicole Cella, RPh Clinical Pharmacist Pager: [phone] 09/24/2014 .2:50 AM

## 2014-09-24 NOTE — Progress Notes (Signed)
Subjective: Patient reports no complaints. He couldn't cooperate for abd CT. He is more calm this AM after haldol and his orientation is returning. He is speaking again. After we discussed possible perc. Nephrostomy tubes, he asked me how long it would take to get the "stents put in".     Objective: Vital signs in last 24 hours: Temp:  [99.1 F (37.3 C)-100.9 F (38.3 C)] 99.1 F (37.3 C) (02/28 0306) Pulse Rate:  [62-96] 62 (02/28 0306) Resp:  [19-31] 20 (02/28 0400) BP: (129-197)/(70-122) 129/87 mmHg (02/28 0400) SpO2:  [93 %-100 %] 99 % (02/28 0400) Weight:  [112.8 kg (248 lb 10.9 oz)-114.8 kg (253 lb 1.4 oz)] 112.8 kg (248 lb 10.9 oz) (02/28 0306)   Intake/Output from previous day: 02/27 0701 - 02/28 0700 In: 4049.3 [I.V.:3199.3; IV Piggyback:300] Out: 5700 [Urine:5700] Intake/Output this shift:    Physical Exam:  NAD Sitting up in bed Urine clear, continues good output   Lab Results:  Recent Labs  09/22/14 0600 09/23/14 0332 09/24/14 0549  HGB 9.1* 8.9* 8.6*  HCT 28.5* 27.2* 25.7*   BMET  Recent Labs  09/22/14 0600 09/23/14 0332  NA 142 146*  K 3.7 3.3*  CL 109 110  CO2 26 27  GLUCOSE 96 107*  BUN 19 19  CREATININE 2.31* 2.34*  CALCIUM 8.4 8.6    Recent Labs  09/22/14 2325 09/23/14 0332 09/24/14 0549  INR 1.57* 1.43 1.25   No results for input(s): LABURIN in the last 72 hours. Results for orders placed or performed during the hospital encounter of 09/17/14  Urine culture     Status: None   Collection Time: 09/17/14  4:29 PM  Result Value Ref Range Status   Specimen Description URINE, CATHETERIZED  Final   Special Requests NONE  Final   Colony Count >=100,000 COLONIES/ML  Final   Culture   Final    ACINETOBACTER CALCOACETICUS/BAUMANNII COMPLEX KLEBSIELLA PNEUMONIAE Note: Confirmed Extended Spectrum Beta-Lactamase Producer (ESBL) CRITICAL RESULT CALLED TO, READ BACK BY AND VERIFIED WITH: Carin Hock RN on 09/22/14 at 02:05 by Rise Mu    Report Status 09/22/2014 FINAL  Final   Organism ID, Bacteria ACINETOBACTER CALCOACETICUS/BAUMANNII COMPLEX  Final   Organism ID, Bacteria KLEBSIELLA PNEUMONIAE  Final      Susceptibility   Klebsiella pneumoniae - MIC*    AMPICILLIN >=32 RESISTANT Resistant     CEFAZOLIN >=64 RESISTANT Resistant     CEFTRIAXONE >=64 RESISTANT Resistant     CIPROFLOXACIN >=4 RESISTANT Resistant     GENTAMICIN >=16 RESISTANT Resistant     LEVOFLOXACIN >=8 RESISTANT Resistant     NITROFURANTOIN 256 RESISTANT Resistant     TOBRAMYCIN 2 SENSITIVE Sensitive     TRIMETH/SULFA >=320 RESISTANT Resistant     IMIPENEM <=0.25 SENSITIVE Sensitive     PIP/TAZO >=128 RESISTANT Resistant     * KLEBSIELLA PNEUMONIAE   Acinetobacter calcoaceticus/baumannii complex - MIC*    CEFTRIAXONE Value in next row Resistant      >=64 RESISTANTPerformed at [facility]    CIPROFLOXACIN Value in next row Resistant      >=4 RESISTANTPerformed at [facility]    GENTAMICIN Value in next row Sensitive      <=1 SENSITIVEPerformed at [facility]    PIP/TAZO Value in next row Resistant      >=128 RESISTANTPerformed at [facility]    TOBRAMYCIN Value in next row Sensitive      <=1 SENSITIVEPerformed at [facility]  TRIMETH/SULFA Value in next row Resistant      >=320 RESISTANTPerformed at [facility]    LEVOFLOXACIN Value in next row Resistant      >=8 RESISTANTPerformed at [facility] Value in next row Resistant      >=512 RESISTANTPerformed at [facility]    IMIPENEM Value in next row Resistant      >=16 RESISTANTPerformed at [place]  Blood Culture (routine x 2)     Status: None   Collection Time: 09/17/14  4:40 PM  Result Value Ref Range Status   Specimen Description BLOOD RIGHT ARM  Final   Special Requests BOTTLES DRAWN AEROBIC AND ANAEROBIC 10CC  Final   Culture    Final    NO GROWTH 5 DAYS Performed at [facility]    Report Status 09/24/2014 FINAL  Final  Blood Culture (routine x 2)     Status: None   Collection Time: 09/17/14  4:55 PM  Result Value Ref Range Status   Specimen Description BLOOD LEFT HAND  Final   Special Requests BOTTLES DRAWN AEROBIC ONLY 5CC  Final   Culture   Final    NO GROWTH 5 DAYS Performed at [facility]    Report Status 09/24/2014 FINAL  Final  MRSA PCR Screening     Status: Abnormal   Collection Time: 09/17/14  8:10 PM  Result Value Ref Range Status   MRSA by PCR POSITIVE (A) NEGATIVE Final    Comment:        The GeneXpert MRSA Assay (FDA approved for NASAL specimens only), is one component of a comprehensive MRSA colonization surveillance program. It is not intended to diagnose MRSA infection nor to guide or monitor treatment for MRSA infections. RESULT CALLED TO, READ BACK BY AND VERIFIED WITH: PETTIFORD,A RN [phone] 09/18/14 MITCHELL,L   Culture, blood (routine x 2)     Status: None (Preliminary result)   Collection Time: 09/22/14 11:50 PM  Result Value Ref Range Status   Specimen Description BLOOD LEFT ARM  Final   Special Requests BOTTLES DRAWN AEROBIC AND ANAEROBIC 3CC  Final   Culture   Final           BLOOD CULTURE RECEIVED NO GROWTH TO DATE CULTURE WILL BE HELD FOR 5 DAYS BEFORE ISSUING A FINAL NEGATIVE REPORT Performed at [facility]    Report Status PENDING  Incomplete  Culture, blood (routine x 2)     Status: None (Preliminary result)   Collection Time: 09/22/14 11:56 PM  Result Value Ref Range Status   Specimen Description BLOOD LEFT HAND  Final   Special Requests BOTTLES DRAWN AEROBIC ONLY 2CC  Final   Culture   Final           BLOOD CULTURE RECEIVED NO GROWTH TO DATE CULTURE WILL BE HELD FOR 5 DAYS BEFORE ISSUING A FINAL NEGATIVE REPORT Performed at [facility]    Report Status PENDING  Incomplete    Studies/Results: Ct Head Wo Contrast  09/23/2014    CLINICAL DATA:  Took Ativan to sleep last night. Not responding this morning. Altered mental status.  EXAM: CT HEAD WITHOUT CONTRAST  TECHNIQUE: Contiguous axial images were obtained from the base of the skull through the vertex without intravenous contrast.  COMPARISON:  12/07/2013  FINDINGS: Patient was uncooperative for the study which had to be repeated several times due to motion. No visible acute  intracranial abnormality. No visible acute infarction. No hemorrhage or hydrocephalus. No extra-axial fluid collection. Paranasal sinuses and mastoids are clear. No acute calvarial abnormality.  IMPRESSION: Typical, somewhat limited study due to patient motion. Study was repeated several times. No visible acute intracranial abnormality. With the multiple attempts, the study is felt to be diagnostic.   Electronically Signed   By: Rolm Baptise M.D.   On: 09/23/2014 11:12   Dg Chest Port 1 View  09/22/2014   CLINICAL DATA:  Respiratory distress.  EXAM: PORTABLE CHEST - 1 VIEW  COMPARISON:  09/20/2014  FINDINGS: Right PICC line remains in place, unchanged. Very low lung volumes with cardiomegaly and bibasilar atelectasis. No overt edema. No visible effusions. No acute bony abnormality.  IMPRESSION: Very low lung volumes with cardiomegaly and bibasilar atelectasis.   Electronically Signed   By: Rolm Baptise M.D.   On: 09/22/2014 20:49    Assessment/Plan: -bilateral hydronephrosis - appreciate nephrology input. -seizure - pt with h/p seizure and appears to be stabilizing -bladder mass - I'll plan TURBT, possible ureteral stent placement Tuesday, Mar 1. If retrograde ureteral stent placement is not possible, I'll have him nephrostomies placed at [facility].  -bacteriuria - plan pre-op and post-op Tobramycin per ID recs.    I discussed patient with Dr. Tana Coast and Dr. Jonnie Finner.    LOS: 7 days   Ahilyn Nell 09/24/2014, 10:02 AM

## 2014-09-24 NOTE — Progress Notes (Signed)
    Expand All Collapse All   Pt. Having non-purposeful movements. Occasional clonic movements noted. Pt. not able to follow commands. Pupils are 28mm., fixed, and non-responsive at this time.

## 2014-09-24 NOTE — Progress Notes (Signed)
Notified Md about pt yelling and moaning out.  When asked pt if hurting. Pt states" yes"  Tried to reposition with no relief.  New orders received.  Will continue to monitor. Saunders Revel T

## 2014-09-24 NOTE — Progress Notes (Signed)
Tried to send pt down to Ct again, unable pt still thrashing around in bed.  md notified.  Pt's temp 101. A .  Pt unable to take po meds. New orders received. Will continue to monitor. Saunders Revel T

## 2014-09-25 ENCOUNTER — Other Ambulatory Visit: Payer: Self-pay | Admitting: Urology

## 2014-09-25 ENCOUNTER — Inpatient Hospital Stay (HOSPITAL_COMMUNITY): Payer: Medicare Other

## 2014-09-25 DIAGNOSIS — R4182 Altered mental status, unspecified: Secondary | ICD-10-CM | POA: Insufficient documentation

## 2014-09-25 DIAGNOSIS — G9340 Encephalopathy, unspecified: Secondary | ICD-10-CM

## 2014-09-25 LAB — URINE CULTURE: Colony Count: 15000

## 2014-09-25 LAB — MAGNESIUM
Magnesium: 1.3 mg/dL — ABNORMAL LOW (ref 1.5–2.5)
Magnesium: 2.5 mg/dL (ref 1.5–2.5)

## 2014-09-25 LAB — BASIC METABOLIC PANEL
Anion gap: 6 (ref 5–15)
Anion gap: 9 (ref 5–15)
BUN: 18 mg/dL (ref 6–23)
BUN: 21 mg/dL (ref 6–23)
CALCIUM: 7.6 mg/dL — AB (ref 8.4–10.5)
CO2: 26 mmol/L (ref 19–32)
CO2: 25 mmol/L (ref 19–32)
CREATININE: 2.04 mg/dL — AB (ref 0.50–1.35)
Calcium: 7.2 mg/dL — ABNORMAL LOW (ref 8.4–10.5)
Chloride: 99 mmol/L (ref 96–112)
Chloride: 102 mmol/L (ref 96–112)
Creatinine, Ser: 1.99 mg/dL — ABNORMAL HIGH (ref 0.50–1.35)
GFR calc Af Amer: 40 mL/min — ABNORMAL LOW (ref >90)
GFR calc Af Amer: 41 mL/min — ABNORMAL LOW (ref >90)
GFR, EST NON AFRICAN AMERICAN: 34 mL/min — AB (ref >90)
GFR, EST NON AFRICAN AMERICAN: 36 mL/min — AB (ref >90)
GLUCOSE: 111 mg/dL — AB (ref 70–99)
Glucose, Bld: 147 mg/dL — ABNORMAL HIGH (ref 70–99)
Potassium: 2 mmol/L — CL (ref 3.5–5.1)
Potassium: 2.6 mmol/L — CL (ref 3.5–5.1)
SODIUM: 136 mmol/L (ref 135–145)
Sodium: 131 mmol/L — ABNORMAL LOW (ref 135–145)

## 2014-09-25 LAB — CBC
HCT: 26.7 % — ABNORMAL LOW (ref 39.0–52.0)
HEMOGLOBIN: 9 g/dL — AB (ref 13.0–17.0)
MCH: 26.9 pg (ref 26.0–34.0)
MCHC: 33.7 g/dL (ref 30.0–36.0)
MCV: 79.7 fL (ref 78.0–100.0)
PLATELETS: 187 10*3/uL (ref 150–400)
RBC: 3.35 MIL/uL — AB (ref 4.22–5.81)
RDW: 15.1 % (ref 11.5–15.5)
WBC: 11.4 10*3/uL — AB (ref 4.0–10.5)

## 2014-09-25 LAB — PROTIME-INR
INR: 1.14 (ref 0.00–1.49)
Prothrombin Time: 14.8 seconds (ref 11.6–15.2)

## 2014-09-25 LAB — HEPARIN LEVEL (UNFRACTIONATED): HEPARIN UNFRACTIONATED: 0.43 [IU]/mL (ref 0.30–0.70)

## 2014-09-25 MED ORDER — POTASSIUM CHLORIDE 10 MEQ/50ML IV SOLN
INTRAVENOUS | Status: AC
  Administered 2014-09-25: 10 meq
  Filled 2014-09-25: qty 100

## 2014-09-25 MED ORDER — DEXTROSE 5 % IV SOLN
3.0000 g | Freq: Once | INTRAVENOUS | Status: AC
  Administered 2014-09-25: 3 g via INTRAVENOUS
  Filled 2014-09-25 (×2): qty 6

## 2014-09-25 MED ORDER — POTASSIUM CHLORIDE 10 MEQ/100ML IV SOLN
10.0000 meq | INTRAVENOUS | Status: AC
Start: 2014-09-25 — End: 2014-09-25
  Administered 2014-09-25 (×3): 10 meq via INTRAVENOUS
  Filled 2014-09-25: qty 100

## 2014-09-25 MED ORDER — POTASSIUM CHLORIDE 10 MEQ/100ML IV SOLN
10.0000 meq | INTRAVENOUS | Status: DC
  Administered 2014-09-25: 10 meq via INTRAVENOUS
  Filled 2014-09-25: qty 100

## 2014-09-25 MED ORDER — PREGABALIN 75 MG PO CAPS
75.0000 mg | ORAL_CAPSULE | Freq: Three times a day (TID) | ORAL | Status: DC
  Administered 2014-09-25 – 2014-10-01 (×18): 75 mg via ORAL
  Filled 2014-09-25 (×19): qty 1

## 2014-09-25 MED ORDER — POTASSIUM CL IN DEXTROSE 5% 20 MEQ/L IV SOLN
20.0000 meq | INTRAVENOUS | Status: DC
  Administered 2014-09-25 – 2014-09-28 (×9): 20 meq via INTRAVENOUS
  Filled 2014-09-25 (×17): qty 1000

## 2014-09-25 MED ORDER — CEFAZOLIN SODIUM-DEXTROSE 2-3 GM-% IV SOLR
2.0000 g | INTRAVENOUS | Status: AC
  Administered 2014-09-26: 2 g via INTRAVENOUS

## 2014-09-25 MED ORDER — ENSURE COMPLETE PO LIQD
237.0000 mL | Freq: Two times a day (BID) | ORAL | Status: DC
  Administered 2014-09-25 – 2014-10-01 (×9): 237 mL via ORAL

## 2014-09-25 MED ORDER — HYDROMORPHONE HCL 1 MG/ML IJ SOLN
1.0000 mg | INTRAMUSCULAR | Status: DC | PRN
  Administered 2014-09-25 – 2014-09-29 (×11): 1 mg via INTRAVENOUS
  Filled 2014-09-25 (×11): qty 1

## 2014-09-25 MED ORDER — POTASSIUM CHLORIDE 10 MEQ/100ML IV SOLN
10.0000 meq | INTRAVENOUS | Status: AC
  Administered 2014-09-25 – 2014-09-26 (×6): 10 meq via INTRAVENOUS
  Filled 2014-09-25 (×3): qty 100

## 2014-09-25 MED ORDER — MAGNESIUM SULFATE 50 % IJ SOLN: 3.0000 g | Freq: Once | INTRAVENOUS | Status: DC

## 2014-09-25 MED ORDER — POTASSIUM CHLORIDE CRYS ER 20 MEQ PO TBCR
40.0000 meq | EXTENDED_RELEASE_TABLET | ORAL | Status: AC
  Administered 2014-09-25: 40 meq via ORAL
  Filled 2014-09-25: qty 2

## 2014-09-25 MED ORDER — POTASSIUM CHLORIDE 10 MEQ/50ML IV SOLN
INTRAVENOUS | Status: AC
Start: 2014-09-25 — End: 2014-09-25
  Administered 2014-09-25: 10 meq
  Filled 2014-09-25: qty 50

## 2014-09-25 MED ORDER — CALCIUM CARBONATE ANTACID 500 MG PO CHEW
1.0000 | CHEWABLE_TABLET | Freq: Once | ORAL | Status: AC
  Administered 2014-09-25: 200 mg via ORAL
  Filled 2014-09-25: qty 1

## 2014-09-25 MED ORDER — PHENAZOPYRIDINE HCL 100 MG PO TABS
100.0000 mg | ORAL_TABLET | Freq: Once | ORAL | Status: DC
  Filled 2014-09-25: qty 1

## 2014-09-25 MED ORDER — TOBRAMYCIN SULFATE 80 MG/2ML IJ SOLN
400.0000 mg | INTRAVENOUS | Status: AC
  Administered 2014-09-26: 400 mg via INTRAVENOUS
  Filled 2014-09-25: qty 10

## 2014-09-25 NOTE — Progress Notes (Signed)
Pharmacy - Heparin dosing per pharmacy  Assessment:   58yoM [facility] the ED from [facility]F on 2/21 with cough and SOB.  Pharmacy consulted to dose heparin while off warfarin pending trip to OR  2/29: noted with substantial hematuria around the time of transfer to [facility].  Heparin stopped.  Plans for urological procedure tomorrow  Plan: Will hold off on resuming heparin until assessment of hemostasis from MD and confirmed by nursing. F/u plans for surgery tomorrow, in case rescheduled d/t hematuria  Reuel Boom, PharmD Pager: [phone] 09/25/2014, 4:58 PM

## 2014-09-25 NOTE — Progress Notes (Addendum)
CRITICAL VALUE ALERT  Critical value received:  Potassium 2.0 Date of notification:  09/25/2014  Time of notification:  0620  Critical value read back: yes  Nurse who received alert:  Saunders Revel   MD notified (1st page):  Rogue Bussing  Time of first page:  0624  MD notified (2nd page):  Time of second page:  Responding MD:   Time MD responded:

## 2014-09-25 NOTE — Progress Notes (Signed)
Report given to [place], RN at [facility] ICU/SD. [facility] called.

## 2014-09-25 NOTE — Progress Notes (Signed)
Routine EEG completed, results pending. 

## 2014-09-25 NOTE — Progress Notes (Addendum)
Subjective: Much improved, denies   Exam: Filed Vitals:   09/25/14 0800  BP:   Pulse:   Temp: 98.3 F (36.8 C)  Resp:    Gen: In bed, NAD MS: Awake, alert, gives year as 2019, but knows it is feb 29.  WE:RXVQ, PERRL Motor: Moves bilateral UE well, weakness in distal right leg, some movement proximally.  Sensory:decreased below the knee.   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 Depakote was added, but rather than adding third agent with interactions(decreases lamotrigine clearance) I would favor increasing his lyrica dose from his baseline. Merrem can lower seizure threshold some and may have contributed.   Recommendations: 1) continue lamictal 200mg  BID 2) restart lyrica at 75mg  TID 3) d/c depakote  Roland Rack, MD [facility] [phone]  If 7pm- 7am, please page neurology on call as listed in [place].

## 2014-09-25 NOTE — Progress Notes (Signed)
ANTICOAGULATION CONSULT NOTE - Follow Up Consult  Pharmacy Consult for Heparin Indication: pulmonary embolus, history  Allergies  Allergen Reactions  . Tomato Other (See Comments)    Unknown reaction    Patient Measurements: Height: 5\' 5"  (165.1 cm) Weight: 251 lb 8.7 oz (114.1 kg) IBW/kg (Calculated) : 61.5  Vital Signs: Temp: 97.3 F (36.3 C) (02/29 0540) Temp Source: Oral (02/29 0540) BP: 173/75 mmHg (02/29 0540)  Labs:  Recent Labs  09/23/14 0332  09/24/14 0549  09/24/14 1309 09/24/14 2023 09/25/14 0355 09/25/14 0356  HGB 8.9*  --  8.6*  --   --   --  9.0*  --   HCT 27.2*  --  25.7*  --   --   --  26.7*  --   PLT 169  --  171  --   --   --  187  --   LABPROT 17.6*  --  15.9*  --   --   --   --  14.8  INR 1.43  --  1.25  --   --   --   --  1.14  HEPARINUNFRC  --   < >  --   < > 0.10* 0.38  --  0.43  CREATININE 2.34*  --   --   --   --   --  2.04*  --   < > = values in this interval not displayed.  Estimated Creatinine Clearance: 46.6 mL/min (by C-G formula based on Cr of 2.04).   Medications:  Prescriptions prior to admission  Medication Sig Dispense Refill Last Dose  . acetaminophen (TYLENOL) 325 MG tablet Take 650 mg by mouth every 6 (six) hours as needed for moderate pain.   09/14/2014  . alum & mag hydroxide-simeth (MAALOX/MYLANTA) 200-200-20 MG/5ML suspension Take 20 mLs by mouth 2 (two) times daily as needed for indigestion or heartburn.   09/15/2014  . amLODipine (NORVASC) 5 MG tablet Take 1 tablet (5 mg total) by mouth at bedtime.   09/16/2014 at Unknown time  . atorvastatin (LIPITOR) 10 MG tablet Take 10 mg by mouth at bedtime.    09/16/2014 at Unknown time  . baclofen (LIORESAL) 20 MG tablet Take 20 mg by mouth 3 (three) times daily. 9am, 2pm, 9pm   09/17/2014 at 900  . calcium carbonate (OS-CAL - DOSED IN MG OF ELEMENTAL CALCIUM) 1250 MG tablet Take 1 tablet by mouth daily with breakfast.   09/17/2014 at Unknown time  . calcium carbonate (TUMS - DOSED IN  MG ELEMENTAL CALCIUM) 500 MG chewable tablet Chew 4 tablets by mouth every 4 (four) hours as needed for indigestion or heartburn.    08/31/2014  . chlorpheniramine (CHLOR-TRIMETON) 4 MG tablet Take 8 mg by mouth at bedtime as needed for allergies (cough).   unknown  . clonazePAM (KLONOPIN) 0.5 MG tablet Take one tablet by mouth three times daily for anxiety 90 tablet 5 09/17/2014 at Unknown time  . clonazePAM (KLONOPIN) 1 MG tablet Take one tablet by mouth every 12 hours as needed for anxiety (Patient taking differently: Take 1 mg by mouth every 12 (twelve) hours as needed for anxiety. Also takes 0.5 mg 3 times daily) 60 tablet 5 unknown  . dexlansoprazole (DEXILANT) 60 MG capsule Take 60 mg by mouth 2 (two) times daily.    09/17/2014 at am  . DULoxetine (CYMBALTA) 60 MG capsule Take 60 mg by mouth at bedtime.   09/16/2014 at Unknown time  . famotidine (PEPCID) 20 MG tablet Take  20 mg by mouth at bedtime.   09/16/2014 at Unknown time  . fentaNYL (DURAGESIC - DOSED MCG/HR) 50 MCG/HR Place 1 patch (50 mcg total) onto the skin every 3 (three) days. Remove old patch, external use only, rotate sites, check placement every shift 10 patch 0 09/13/2014  . ferrous sulfate 325 (65 FE) MG tablet Take 325 mg by mouth 2 (two) times daily with a meal.   3/47/4259 at am  . folic acid (FOLVITE) 1 MG tablet Take 1 mg by mouth daily.    09/17/2014 at Unknown time  . GENERLAC 10 GM/15ML SOLN Take 20 g by mouth 4 (four) times daily. 9am, 1pm, 5pm, 9pm   09/17/2014 at 1300  . guaiFENesin (ROBITUSSIN) 100 MG/5ML liquid Take 300 mg by mouth 3 (three) times daily as needed for cough.    09/17/2014 at Unknown time  . HYDROcodone-acetaminophen (NORCO/VICODIN) 5-325 MG per tablet Take 1-2 tablets by mouth every 6 (six) hours as needed for moderate pain or severe pain.   09/17/2014 at 1000  . ipratropium-albuterol (DUONEB) 0.5-2.5 (3) MG/3ML SOLN Take 3 mLs by nebulization See admin instructions. Inhale 1 vial 4 times daily for lung disease;  may also take every 4 hours as needed for wheezing/ congestion   09/17/2014 at 1300  . lamoTRIgine (LAMICTAL) 200 MG tablet Take 200 mg by mouth 2 (two) times daily. To stabilize bipolar mood   09/17/2014 at am  . levothyroxine (SYNTHROID, LEVOTHROID) 125 MCG tablet Take 125 mcg by mouth daily before breakfast.   09/17/2014 at Unknown time  . LORazepam (ATIVAN) 0.5 MG tablet Take one tablet by mouth once daily as needed for anxiety (Patient taking differently: Take 0.5 mg by mouth daily as needed for anxiety. ) 30 tablet 5 unknown  . LORazepam (ATIVAN) 2 MG/ML injection Inject 0.6ml intramuscularly daily as needed for anxiety (Patient taking differently: Inject 0.5 mg into the muscle daily as needed for anxiety. ) 15 mL 0 unknown  . meclizine (ANTIVERT) 25 MG tablet Take 1 tablet (25 mg total) by mouth 3 (three) times daily as needed for dizziness. 30 tablet 0 unknown  . metoCLOPramide (REGLAN) 5 MG tablet Take 5 mg by mouth 4 (four) times daily -  before meals and at bedtime.    09/17/2014 at noon  . nitroGLYCERIN (NITROSTAT) 0.4 MG SL tablet Place 0.4 mg under the tongue every 5 (five) minutes as needed for chest pain. Up to 3 doses   unknown  . ondansetron (ZOFRAN) 4 MG tablet Take 4 mg by mouth 3 (three) times daily as needed for nausea or vomiting.   unknown  . oxybutynin (DITROPAN-XL) 5 MG 24 hr tablet Take 5 mg by mouth daily.    09/17/2014 at Unknown time  . oxyCODONE (ROXICODONE) 15 MG immediate release tablet Take one tablet by mouth three times daily as needed for moderate to severe pain (Patient taking differently: Take 15 mg by mouth 3 (three) times daily as needed (moderate to severe pain). ) 90 tablet 0 09/14/2014  . phenol (CHLORASEPTIC) 1.4 % LIQD Use as directed 2 sprays in the mouth or throat every 3 (three) hours as needed for throat irritation / pain.   unknown  . Polyethyl Glycol-Propyl Glycol (SYSTANE OP) Place 2 drops into both eyes 3 (three) times daily. 8am, 2pm, 8pm   09/17/2014 at  1400  . potassium chloride (MICRO-K) 10 MEQ CR capsule Take 10 mEq by mouth daily.    09/17/2014 at Unknown time  . predniSONE (DELTASONE)  10 MG tablet Take 10 mg by mouth daily with breakfast.   09/17/2014 at Unknown time  . pregabalin (LYRICA) 75 MG capsule Take one capsule by mouth twice daily for pain (Patient taking differently: Take 75 mg by mouth 2 (two) times daily. ) 60 capsule 5 09/17/2014 at am  . promethazine (PHENERGAN) 12.5 MG tablet Take 12.5 mg by mouth every 6 (six) hours as needed for nausea or vomiting.   unknown  . QUEtiapine (SEROQUEL) 200 MG tablet Take 200 mg by mouth 2 (two) times daily.   09/17/2014 at 900  . saccharomyces boulardii (FLORASTOR) 250 MG capsule Take 250 mg by mouth 2 (two) times daily.   09/17/2014 at am  . senna-docusate (SENOKOT-S) 8.6-50 MG per tablet Take 1 tablet by mouth 2 (two) times daily.    09/17/2014 at am  . vitamin B-12 (CYANOCOBALAMIN) 1000 MCG tablet Take 1,000 mcg by mouth every other day.    09/16/2014 at Unknown time  . Vitamin D, Ergocalciferol, (DRISDOL) 50000 UNITS CAPS capsule Take 50,000 Units by mouth every 30 (thirty) days. Takes on the 11th of the month   09/07/2014  . warfarin (COUMADIN) 4 MG tablet Take 4 mg by mouth See admin instructions. Take 1 tablet (4 mg) Monday, Wednesday, Friday, Saturday at 5pm (take 4.5 mg on Tuesday, Thursday and Sunday)   09/16/2014 at Unknown time  . WARFARIN SODIUM PO Take 4.5 mg by mouth See admin instructions. Take 4.5 mg on Tuesday, Thursday, Sunday at 5pm, (take 4 mg on Monday, Wednesday, Friday and Saturday)   09/14/2014  . amoxicillin-clavulanate (AUGMENTIN) 875-125 MG per tablet Take 1 tablet by mouth 2 (two) times daily with a meal. 8 day course to start 09/18/14 (after procedure)   not yet started  . cephALEXin (KEFLEX) 250 MG capsule Take 2 capsules (500 mg total) by mouth 2 (two) times daily. (Patient not taking: Reported on 09/17/2014) 14 capsule 0 Not Taking at Unknown time  . fentaNYL (DURAGESIC - DOSED  MCG/HR) 75 MCG/HR Place 1 patch (75 mcg total) onto the skin every 3 (three) days. *Remove old Patch*Rotate Site* (Patient not taking: Reported on 08/25/2014) 10 patch 0 Not Taking at Unknown time    Assessment:  58yoM [facility] ED from [facility]F [facility] with cough and SOB.  Pharmacy consulted to dose heparin while off warfarin pending trip to OR.  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 HL therapeutic.  CBC low but stable.  No bleeding noted.    ID: CXR showed LLL infiltrate s/p Vanc/Meropenem x 6D for HCAP  - Afeb, WBC trending down.  hx ESBL kleb UTI in 1/16 - MDR bugs in urine 2/26 PM - talked with Dr. [name]. MDR UTI colonizer; recs tobra x 1 preop. (Plan to OR 3/1)  Vanc 2/21 >>2/26 Zosyn 2/21 >> 2/21 Meropenem 2/21 >>2/26  2/21 BCx x2 - NGTD 2/21 UCx - resistant acinetobacter + ESBL Klebsiella 2/21 MRSA PCR - positive  Renal: Hx neurogenic bladder with indwelling foley cath - Scr  Remains up  - -CT suspicious of bladder tumor, worsening B/L hydronephrosis possibly d/t tumor obstruction; plan was to tx for [facility] for urology procdure 2/28, this has been postponed to 3/1 d/t unstable picture- oxybutynin  PTA Med Issues: duragesic, Fe, lactulose, norvasc,  Oscal, Pepcid, folate, KCL  BP: hep, MC  Goal of Therapy:  Heparin level 0.3-0.7 units/ml Monitor platelets by anticoagulation protocol: Yes   Plan:  1. Continue heparin level at 1600 units/hr. 2. F/u tx to [facility] and preop tobramycin per ID   Thank you for allowing pharmacy to be a part of this patients care team.  Rowe Robert Pharm.D., BCPS, AQ-Cardiology Clinical Pharmacist 09/25/2014 8:08 AM Pager: [phone] Phone: [phone]

## 2014-09-25 NOTE — Progress Notes (Signed)
Subjective: Patient back to baseline. Completing an EEG.   Objective: Vital signs in last 24 hours: Temp:  [97.3 F (36.3 C)-99 F (37.2 C)] 98.3 F (36.8 C) (02/29 0800) Pulse Rate:  [106] 106 (02/28 1357) Resp:  [19-43] 19 (02/29 0800) BP: (112-194)/(62-124) 166/85 mmHg (02/29 0800) SpO2:  [92 %-96 %] 94 % (02/29 0800) Weight:  [114.1 kg (251 lb 8.7 oz)] 114.1 kg (251 lb 8.7 oz) (02/29 0540)  Intake/Output from previous day: 02/28 0701 - 02/29 0700 In: 4152.3 [P.O.:120; I.V.:3822.3; IV Piggyback:210] Out: 2175 [Urine:2175] Intake/Output this shift: Total I/O In: 608 [I.V.:408; IV Piggyback:200] Out: 450 [Urine:450]  Physical Exam:  NAD, A&Ox3 GU: pt developed gross hematuria about "20 minutes ago" per nurse.   Procedure: I irrigated bladder with 1 L water. Some mild bleeding and very soft, minimal clots.   Lab Results:  Recent Labs  09/23/14 0332 09/24/14 0549 09/25/14 0355  HGB 8.9* 8.6* 9.0*  HCT 27.2* 25.7* 26.7*   BMET  Recent Labs  09/23/14 0332 09/25/14 0355  NA 146* 131*  K 3.3* 2.0*  CL 110 99  CO2 27 26  GLUCOSE 107* 111*  BUN 19 18  CREATININE 2.34* 2.04*  CALCIUM 8.6 7.2*    Recent Labs  09/23/14 0332 09/24/14 0549 09/25/14 0356  INR 1.43 1.25 1.14   No results for input(s): LABURIN in the last 72 hours. Results for orders placed or performed during the hospital encounter of 09/17/14  Urine culture     Status: None   Collection Time: 09/17/14  4:29 PM  Result Value Ref Range Status   Specimen Description URINE, CATHETERIZED  Final   Special Requests NONE  Final   Colony Count >=100,000 COLONIES/ML  Final   Culture   Final    ACINETOBACTER CALCOACETICUS/BAUMANNII COMPLEX KLEBSIELLA PNEUMONIAE Note: Confirmed Extended Spectrum Beta-Lactamase Producer (ESBL) CRITICAL RESULT CALLED TO, READ BACK BY AND VERIFIED WITH: Carin Hock RN on 09/22/14 at 02:05 by Rise Mu    Report Status 09/22/2014 FINAL  Final   Organism ID, Bacteria  ACINETOBACTER CALCOACETICUS/BAUMANNII COMPLEX  Final   Organism ID, Bacteria KLEBSIELLA PNEUMONIAE  Final      Susceptibility   Klebsiella pneumoniae - MIC*    AMPICILLIN >=32 RESISTANT Resistant     CEFAZOLIN >=64 RESISTANT Resistant     CEFTRIAXONE >=64 RESISTANT Resistant     CIPROFLOXACIN >=4 RESISTANT Resistant     GENTAMICIN >=16 RESISTANT Resistant     LEVOFLOXACIN >=8 RESISTANT Resistant     NITROFURANTOIN 256 RESISTANT Resistant     TOBRAMYCIN 2 SENSITIVE Sensitive     TRIMETH/SULFA >=320 RESISTANT Resistant     IMIPENEM <=0.25 SENSITIVE Sensitive     PIP/TAZO >=128 RESISTANT Resistant     * KLEBSIELLA PNEUMONIAE   Acinetobacter calcoaceticus/baumannii complex - MIC*    CEFTRIAXONE Value in next row Resistant      >=64 [facility]ANTPerformed at [facility]    CIPROFLOXACIN Value in next row Resistant      >=4 RESISTANTPerformed at [facility]    GENTAMICIN Value in next row Sensitive      <=1 SENSITIVEPerformed at [facility]    PIP/TAZO Value in next row Resistant      >=128 RESISTANTPerformed at [facility]    TOBRAMYCIN Value in next row Sensitive      <=1 SENSITIVEPerformed at [facility]    TRIMETH/SULFA Value in next row Resistant      >=320 RESISTANTPerformed at [facility]  LEVOFLOXACIN Value in next row Resistant      >=8 RESISTANTPerformed at [place] Value in next row Resistant      >=512 RESISTANTPerformed at [facility]    IMIPENEM Value in next row Resistant      >=16 RESISTANTPerformed at [place]  Blood Culture (routine x 2)     Status: None   Collection Time: 09/17/14  4:40 PM  Result Value Ref Range Status   Specimen Description BLOOD RIGHT ARM  Final   Special Requests BOTTLES DRAWN AEROBIC AND ANAEROBIC 10CC  Final   Culture   Final    NO GROWTH 5 DAYS Performed at [facility]     Report Status 09/24/2014 FINAL  Final  Blood Culture (routine x 2)     Status: None   Collection Time: 09/17/14  4:55 PM  Result Value Ref Range Status   Specimen Description BLOOD LEFT HAND  Final   Special Requests BOTTLES DRAWN AEROBIC ONLY 5CC  Final   Culture   Final    NO GROWTH 5 DAYS Performed at [facility]    Report Status 09/24/2014 FINAL  Final  MRSA PCR Screening     Status: Abnormal   Collection Time: 09/17/14  8:10 PM  Result Value Ref Range Status   MRSA by PCR POSITIVE (A) NEGATIVE Final    Comment:        The GeneXpert MRSA Assay (FDA approved for NASAL specimens only), is one component of a comprehensive MRSA colonization surveillance program. It is not intended to diagnose MRSA infection nor to guide or monitor treatment for MRSA infections. RESULT CALLED TO, READ BACK BY AND VERIFIED WITH: PETTIFORD,A RN [phone] 09/18/14 MITCHELL,L   Culture, Urine     Status: None (Preliminary result)   Collection Time: 09/22/14  9:02 PM  Result Value Ref Range Status   Specimen Description URINE, CATHETERIZED  Final   Special Requests NONE  Final   Colony Count   Final    15,000 COLONIES/ML Performed at [facility]    Culture   Final    ENTEROCOCCUS SPECIES Performed at [facility]    Report Status PENDING  Incomplete  Culture, blood (routine x 2)     Status: None (Preliminary result)   Collection Time: 09/22/14 11:50 PM  Result Value Ref Range Status   Specimen Description BLOOD LEFT ARM  Final   Special Requests BOTTLES DRAWN AEROBIC AND ANAEROBIC 3CC  Final   Culture   Final           BLOOD CULTURE RECEIVED NO GROWTH TO DATE CULTURE WILL BE HELD FOR 5 DAYS BEFORE ISSUING A FINAL NEGATIVE REPORT Performed at [facility]    Report Status PENDING  Incomplete  Culture, blood (routine x 2)     Status: None (Preliminary result)   Collection Time: 09/22/14 11:56 PM  Result Value Ref Range Status   Specimen Description BLOOD LEFT  HAND  Final   Special Requests BOTTLES DRAWN AEROBIC ONLY 2CC  Final   Culture   Final           BLOOD CULTURE RECEIVED NO GROWTH TO DATE CULTURE WILL BE HELD FOR 5 DAYS BEFORE ISSUING A FINAL NEGATIVE REPORT Performed at [facility]    Report Status PENDING  Incomplete    Studies/Results: Ct Abdomen Pelvis Wo Contrast  09/25/2014   CLINICAL DATA:  Hydronephrosis  EXAM: CT ABDOMEN AND PELVIS WITHOUT CONTRAST  TECHNIQUE: Multidetector CT imaging of the abdomen and pelvis was performed following the standard protocol without IV contrast.  COMPARISON:  09/19/2014  FINDINGS: BODY WALL: Symmetric gynecomastia  LOWER CHEST: Decreasing airspace disease in the right lower lobe. Clustered centrilobular nodules in the right middle lobe are unchanged and could be chronic.  Small hiatal hernia.  ABDOMEN/PELVIS:  Liver: Hepatic steatosis.  Biliary: Distended gallbladder with calcified gallstones. There is no evidence of acute inflammation.  Pancreas: Unremarkable.  Spleen: Unremarkable.  Adrenals: Unremarkable.  Kidneys and ureters: Moderate bilateral hydroureteronephrosis, despite decompressed bladder. Nonobstructive stones seen in the left lower pole, up to 5 mm.  Bladder: Irregular thickening of the decompressed bladder, suspicious for tumor. A Foley catheter remains in good position.  Reproductive: Unremarkable.  Bowel: Descending colostomy with unchanged tissue in the low pelvis. Prominent volume of formed stool throughout the colon. No evidence of bowel obstruction. No inflammatory bowel wall thickening.  Retroperitoneum: Re- demonstrated right external iliac chain lymphadenopathy, including a 27 x 42 mm mass. No periaortic lymphadenopathy.  Peritoneum: No ascites or pneumoperitoneum.  Vascular: No acute abnormality.  OSSEOUS: Sacrificed spinal catheter fragments noted. There is extensive spinal fusion and sacral truncation. The patient is status post left hip disarticulation and right intertrochanteric  femur fracture ORIF. 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  IMPRESSION: 1. 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 2. Right pelvic adenopathy compatible with metastatic disease. 3. Improving pneumonia in the right lower lobe. 4. Chronic findings are stable from prior and noted above.   Electronically Signed   By: [name] M.D.   On: 09/25/2014 05:05   Ct Head Wo Contrast  09/23/2014   CLINICAL DATA:  Took Ativan to sleep last night. Not responding this morning. Altered mental status.  EXAM: CT HEAD WITHOUT CONTRAST  TECHNIQUE: Contiguous axial images were obtained from the base of the skull through the vertex without intravenous contrast.  COMPARISON:  12/07/2013  FINDINGS: Patient was uncooperative for the study which had to be repeated several times due to motion. No visible acute intracranial abnormality. No visible acute infarction. No hemorrhage or hydrocephalus. No extra-axial fluid collection. Paranasal sinuses and mastoids are clear. No acute calvarial abnormality.  IMPRESSION: Typical, somewhat limited study due to patient motion. Study was repeated several times. No visible acute intracranial abnormality. With the multiple attempts, the study is felt to be diagnostic.   Electronically Signed   By: Rolm Baptise M.D.   On: 09/23/2014 11:12    Assessment/Plan: Gross hematuria - likely from bladder mass. Needs TURBT. Order placed for bladder irrigation as needed.  Bladder mass - TURBT planned in AM at [facility] Bilateral hydro - will attempt retrogrades and ureteral stents if needed, kidney fxn better today. Urine Cx - will plan pre- and post- op dose of tobramycin for enterococcus, acinetobacter and klebsiella  Appreciate all the great care for this patient and excellent hospitalist care. Discussed patient with Dr. Tana Coast, Dr. [name].    LOS: 8 days   Jannice Beitzel 09/25/2014, 10:13 AM

## 2014-09-25 NOTE — Progress Notes (Signed)
Patient ID: Samuel [name], male   DOB: [birth year], 59 y.o.   MRN: [id number]         [facility]    Date of Admission:  09/17/2014             Principal Problem:   HCAP (healthcare-associated pneumonia) Active Problems:   UTI (lower urinary tract infection)   ARF (acute renal failure)   Hematuria   Chronic pain   Sepsis   Somnolence   Acute encephalopathy   Altered mental state   . antiseptic oral rinse  7 mL Mouth Rinse BID  . atorvastatin  10 mg Oral QHS  . feeding supplement (ENSURE COMPLETE)  237 mL Oral BID BM  . lamoTRIgine  200 mg Oral BID  . levothyroxine  62.5 mcg Intravenous QAC breakfast  . magnesium sulfate 1 - 4 g bolus IVPB  3 g Intravenous Once  . metoprolol  2.5 mg Intravenous 4 times per day  . oxybutynin  5 mg Oral Daily  . pantoprazole (PROTONIX) IV  40 mg Intravenous Q12H  . potassium chloride  10 mEq Intravenous Q1 Hr x 4  . potassium chloride  40 mEq Oral Q4H  . predniSONE  10 mg Oral Q breakfast  . pregabalin  75 mg Oral TID  . QUEtiapine  100 mg Oral BID  . saccharomyces boulardii  250 mg Oral BID  . sodium chloride  3 mL Intravenous Q12H    OBJECTIVE: Blood pressure 166/85, pulse 106, temperature 98.3 F (36.8 C), temperature source Oral, resp. rate 19, height 5\' 5"  (1.651 m), weight 251 lb 8.7 oz (114.1 kg), SpO2 94 %.  Lab Results Lab Results  Component Value Date   WBC 11.4* 09/25/2014   HGB 9.0* 09/25/2014   HCT 26.7* 09/25/2014   MCV 79.7 09/25/2014   PLT 187 09/25/2014    Lab Results  Component Value Date   CREATININE 2.04* 09/25/2014   BUN 18 09/25/2014   NA 131* 09/25/2014   K 2.0* 09/25/2014   CL 99 09/25/2014   CO2 26 09/25/2014    Lab Results  Component Value Date   ALT 12 09/23/2014   AST 20 09/23/2014   ALKPHOS 59 09/23/2014   BILITOT 1.1 09/23/2014     Microbiology: Recent Results (from the past 240 hour(s))  Urine culture     Status: None   Collection Time: 09/17/14  4:29 PM   Result Value Ref Range Status   Specimen Description URINE, CATHETERIZED  Final   Special Requests NONE  Final   Colony Count >=100,000 COLONIES/ML  Final   Culture   Final    ACINETOBACTER CALCOACETICUS/BAUMANNII COMPLEX KLEBSIELLA PNEUMONIAE Note: Confirmed Extended Spectrum Beta-Lactamase Producer (ESBL) CRITICAL RESULT CALLED TO, READ BACK BY AND VERIFIED WITH: Carin Hock RN on 09/22/14 at 02:05 by Rise Mu    Report Status 09/22/2014 FINAL  Final   Organism ID, Bacteria ACINETOBACTER CALCOACETICUS/BAUMANNII COMPLEX  Final   Organism ID, Bacteria KLEBSIELLA PNEUMONIAE  Final      Susceptibility   Klebsiella pneumoniae - MIC*    AMPICILLIN >=32 RESISTANT Resistant     CEFAZOLIN >=64 RESISTANT Resistant     CEFTRIAXONE >=64 RESISTANT Resistant     CIPROFLOXACIN >=4 RESISTANT Resistant     GENTAMICIN >=16 RESISTANT Resistant     LEVOFLOXACIN >=8 RESISTANT Resistant     NITROFURANTOIN 256 RESISTANT Resistant     TOBRAMYCIN 2 SENSITIVE Sensitive     TRIMETH/SULFA >=320 RESISTANT Resistant  IMIPENEM <=0.25 SENSITIVE Sensitive     PIP/TAZO >=128 RESISTANT Resistant     * KLEBSIELLA PNEUMONIAE   Acinetobacter calcoaceticus/baumannii complex - MIC*    CEFTRIAXONE Value in next row Resistant      >=64 RESISTANTPerformed at [facility]    CIPROFLOXACIN Value in next row Resistant      >=4 RESISTANTPerformed at [facility]    GENTAMICIN Value in next row Sensitive      <=1 SENSITIVEPerformed at [facility]    PIP/TAZO Value in next row Resistant      >=128 RESISTANTPerformed at [facility]    TOBRAMYCIN Value in next row Sensitive      <=1 SENSITIVEPerformed at [facility]    TRIMETH/SULFA Value in next row Resistant      >=320 RESISTANTPerformed at [facility]    LEVOFLOXACIN Value in next row Resistant      >=8 RESISTANTPerformed at [place] Value in next row Resistant      >=512  RESISTANTPerformed at [facility]    IMIPENEM Value in next row Resistant      >=16 RESISTANTPerformed at [place]  Blood Culture (routine x 2)     Status: None   Collection Time: 09/17/14  4:40 PM  Result Value Ref Range Status   Specimen Description BLOOD RIGHT ARM  Final   Special Requests BOTTLES DRAWN AEROBIC AND ANAEROBIC 10CC  Final   Culture   Final    NO GROWTH 5 DAYS Performed at [facility]    Report Status 09/24/2014 FINAL  Final  Blood Culture (routine x 2)     Status: None   Collection Time: 09/17/14  4:55 PM  Result Value Ref Range Status   Specimen Description BLOOD LEFT HAND  Final   Special Requests BOTTLES DRAWN AEROBIC ONLY 5CC  Final   Culture   Final    NO GROWTH 5 DAYS Performed at [facility]    Report Status 09/24/2014 FINAL  Final  MRSA PCR Screening     Status: Abnormal   Collection Time: 09/17/14  8:10 PM  Result Value Ref Range Status   MRSA by PCR POSITIVE (A) NEGATIVE Final    Comment:        The GeneXpert MRSA Assay (FDA approved for NASAL specimens only), is one component of a comprehensive MRSA colonization surveillance program. It is not intended to diagnose MRSA infection nor to guide or monitor treatment for MRSA infections. RESULT CALLED TO, READ BACK BY AND VERIFIED WITH: PETTIFORD,A RN [phone] 09/18/14 MITCHELL,L   Culture, Urine     Status: None (Preliminary result)   Collection Time: 09/22/14  9:02 PM  Result Value Ref Range Status   Specimen Description URINE, CATHETERIZED  Final   Special Requests NONE  Final   Colony Count   Final    15,000 COLONIES/ML Performed at [facility]    Culture   Final    ENTEROCOCCUS SPECIES Performed at [facility]    Report Status PENDING  Incomplete  Culture, blood (routine x 2)     Status: None (Preliminary result)   Collection Time: 09/22/14 11:50 PM  Result Value Ref Range Status     Specimen Description BLOOD LEFT ARM  Final   Special Requests BOTTLES DRAWN AEROBIC AND ANAEROBIC 3CC  Final   Culture   Final  BLOOD CULTURE RECEIVED NO GROWTH TO DATE CULTURE WILL BE HELD FOR 5 DAYS BEFORE ISSUING A FINAL NEGATIVE REPORT Performed at [facility]    Report Status PENDING  Incomplete  Culture, blood (routine x 2)     Status: None (Preliminary result)   Collection Time: 09/22/14 11:56 PM  Result Value Ref Range Status   Specimen Description BLOOD LEFT HAND  Final   Special Requests BOTTLES DRAWN AEROBIC ONLY 2CC  Final   Culture   Final           BLOOD CULTURE RECEIVED NO GROWTH TO DATE CULTURE WILL BE HELD FOR 5 DAYS BEFORE ISSUING A FINAL NEGATIVE REPORT Performed at [facility]    Report Status PENDING  Incomplete    Assessment: His mental status has improved off of meropenem. He has transferred to [facility] today for his urologic procedures. He has grown enterococcus, Acinetobacter and Klebsiella from recent urine cultures. I discussed antibiotic prophylaxis with Dr. Eda Keys. He should receive one dose of tobramycin before and after his procedures.  Plan: 1. Peri-procedure tobramycin therapy 2. I will sign off now  Michel Bickers, MD [facility] [place] [phone] pager   [phone] cell 09/25/2014, 11:46 AM

## 2014-09-25 NOTE — Progress Notes (Signed)
Report given to [facility]. All belongings with pt. Samuel [name], sitter, to transfer with pt. Attempted to call Shonda,RN twice with update and there was no answer

## 2014-09-25 NOTE — Progress Notes (Signed)
Patient urine output still bloody with small clots present. Heparin drip remains off due to hematuria (turned off before arrival to [facility]). Dr. Tana Coast made aware that urine output is still bloody, telephone order given to keep drip off due to bloody output. Pharmacist covering ICU/SD made aware that Heparin will be held until further notice. Foley catheter irrigated as needed per urology order. Will continue to monitor.

## 2014-09-25 NOTE — Progress Notes (Signed)
Pt developed sudden hematuria. Urology at bedside. Foley irrigated. Dr Tana Coast informed and order received to stop Heparin for two hours then resume. Urology aware. Dilaudid 1mg  IV given.

## 2014-09-25 NOTE — Progress Notes (Signed)
[facility] PROGRESS NOTE  Samuel [name] XVQ:[id number] DOB: 12-Oct-1955 DOA: 09/17/2014 PCP: [name], MD  Interim Summary Patient is a pleasant 59 year old male with past medical history paraplegia, neurogenic bladder, chronic indwelling Foley catheter, status post left lower extremity amputation, presented as a transfer from his skilled nursing facility to the emergency department on 09/17/2014 with complaints of increasing cough and shortness of breath. Workup included a chest x-ray which revealed left lower lobe infiltrate. Initial lab showed white count of 13,600 which trended up to 20,000 on the following day. He was started on broad-spectrum IV antimicrobial therapy with meropenem and vancomycin for presumed healthcare associated pneumonia. Patient was also found to have acute kidney injury presenting with a creatinine 1.92. Previous lab work on 08/25/2014 showed a creatinine 1.0. He was started on IV fluids. Renal ultrasound showed the presence of bilateral hydronephrosis. Patient was continuing to have hematuria. Urine cultures showed Acinetobacter and Klebsiella.  2/27 patient noted to be acutely encephalopathic, not interacting at all, jerky movements. Overnight had a BP of 207/106,was transferred to stepdown unit. In SDU, patient had a generalized tonic-clonic seizure and patient was placed on Depakote by neurology. Neurology also felt that patient most likely has metabolic encephalopathy secondary to meropenem and medication effect. All the narcotics including fentanyl patch and antibiotics were discontinued. Patient was noted to have postictal phase, polyuria and rising creatinine. Patient is being closely followed by neurology ( Dr. Doy Mince), ID (Dr. [name]), urology ( Dr. [name]), and nephrology, Dr. Melvia Heaps.  2/29 patient is alert and oriented, interactive , back to his baseline, creatinine function has been improving. Dr. [name], from urology service will  schedule TURBT bladder tumor and ureteral stent at [facility] on 3/1 AM.    Assessment and plan   Acute encephalopathy likely due to seizure  - improved, patient alert and oriented today, communicating, back to his baseline - workup so far negative, CT head negative, EEG pending - meropenem and narcotics were discontinued - neurology was consulted, patient was placed on IV Depakote by Dr. Doy Mince. Subsequently patient had improvement in his mental status, no further seizures. - discussed in detail with Dr. Leonel Ramsay this morning, who feels that patient should continue Lamictal and Lyrica and discontinued Depakote as there is a concern for Lamictal toxicity if given with Depakote. Lyrica will be increased today. Neurology will be following patient closely at [facility].   Seizure episode - No further seizures, Depakote discontinued by neurology, Dr. Leonel Ramsay this morning, now on Lamictal and Lyrica - EEG pending  Healthcare associated pneumonia/ left lower lobe infiltrates -All antibiotics discontinued following acute encephalopathy/seizure, ID following. Patient has received appropriate course of meropenem for HCAP, has urinary colonization with drug-resistant Klebsiella and Acinetobacter.  -Blood cultures negative so far.      Klebsiella and Acinetobacter UTI - Patient has a history of chronic Foley catheter due to neurogenic bladder,has chronic colonization. - Meropenem has been discontinued. ID, Dr. [name] recommended pharmacy to give preoperative dose of tobramycin before his upcoming urological procedure tomorrow a.m.. I have discussed with pharmacy and placed a pharmacy consult.   Acute kidney injury with bilateral hydronephrosis. -creatinine function is improving, continue IV fluids    Hematuria/bilateral hydronephrosis possibly due to bladder wall thickening and tumor- currently hematuria clear -Patient on chronic anticoagulation presented with hematuria initially felt to be  secondary to recent Foley catheter exchange. -Renal ultrasound showed the presence of bilateral hydronephrosis, CT scan of the abdomen and pelvis showed moderate to severe left hydronephrosis with bladder  wall thickening and possible tumor.  - Discussed with Dr. [name] this morning, patient is scheduled for TURBT for bladder tumor and ureteral stent tomorrow am, 3/1 at [facility], recommended to transfer patient to [facility].    History of pulmonary embolism after surgery for left hip fracture in 2015. - Holding Coumadin for urological procedure, continue IV heparin drip - Patient had IVC filter placement in 09/22/2013 for large PE but subsequently was removed, confirmed per PCP notes, Dr. Dellia Nims (note on 09/05/14).  Reviewed PCP notes, patient was recommended lifelong Coumadin, possibly due to his multiple comorbidities and paraplegic state.  Hypokalemia, hypomagnesemia - Aggressively repleting PO and IV, recheck potassium and magnesium at 4 PM today  Suicide ideation   Overnight patient mentioned that he wanted to 'kill himself and get out of this mess', one-to-one sitter was placed. This morning he is alert and oriented, states that he does not have any suicidal ideation, he just used the wrong word. I have called psychiatry consultation.  - Will continue sitter   Essential hypertension Restarted home amlodipine.  Code Status: Full code  Family Communication: called patient's daughter, Samuel [name], left detailed message    Disposition Plan: transfer to [facility] step down  Consultants:  ID- Dr [name]  Renal: Dr Jonnie Finner  Urology: Dr [name]  Neurology: Dr Doy Mince. Dr Leonel Ramsay  Antibiotics:  Vancomycin (started on 09/17/2014)  discontinued 2/26  Meropenem (started on 09/17/2014) discontinued  2/27  Subjective:  Patient seen and examined, no repeat seizures, overall mental status improving     Objective:  BP 173/75 mmHg  Pulse 106  Temp(Src) 98.3 F (36.8 C) (Oral)   Resp 31  Ht 5\' 5"  (1.651 m)  Wt 114.1 kg (251 lb 8.7 oz)  BMI 41.86 kg/m2  SpO2 94%   Intake/Output Summary (Last 24 hours) at 09/25/14 0812 Last data filed at 09/25/14 0631  Gross per 24 hour  Intake 3986.29 ml  Output   2175 ml  Net 1811.29 ml   Filed Weights   09/23/14 1700 09/24/14 0306 09/25/14 0540  Weight: 114.8 kg (253 lb 1.4 oz) 112.8 kg (248 lb 10.9 oz) 114.1 kg (251 lb 8.7 oz)    Exam:   General: Alert and awake,oriented 3still somewhat confused but significantly improved from yesterday  CVS: Regular rate and rhythm, normal S1 and S2.   Resp: Decreased breath sound at the bases otherwise no wheezing, rales or rhonchi   Abdomen: Obese, soft nontender nondistended, status post ostomy placement  Extremities: Status post left AKA   GU: Hematuria, Foley catheter  Data Reviewed: Basic Metabolic Panel:  Recent Labs Lab 09/20/14 0602 09/21/14 0500 09/22/14 0600 09/23/14 0332 09/25/14 0355  NA 141 143 142 146* 131*  K 3.3* 3.2* 3.7 3.3* 2.0*  CL 112 108 109 110 99  CO2 25 26 26 27 26   GLUCOSE 96 95 96 107* 111*  BUN 14 19 19 19 18   CREATININE 1.61* 1.87* 2.31* 2.34* 2.04*  CALCIUM 8.1* 8.6 8.4 8.6 7.2*  MG  --   --   --   --  1.3*   Liver Function Tests:  Recent Labs Lab 09/23/14 1307  AST 20  ALT 12  ALKPHOS 59  BILITOT 1.1  PROT 7.0  ALBUMIN 3.3*   No results for input(s): LIPASE, AMYLASE in the last 168 hours.  Recent Labs Lab 09/23/14 1307  AMMONIA 22   CBC:  Recent Labs Lab 09/21/14 0500 09/22/14 0600 09/23/14 0332 09/24/14 0549 09/25/14 0355  WBC 10.3  10.5 10.0 13.6* 11.4*  HGB 8.8* 9.1* 8.9* 8.6* 9.0*  HCT 27.5* 28.5* 27.2* 25.7* 26.7*  MCV 83.3 81.2 80.5 79.6 79.7  PLT 232 228 169 171 187   Cardiac Enzymes: No results for input(s): CKTOTAL, CKMB, CKMBINDEX, TROPONINI in the last 168 hours. BNP (last 3 results)  Recent Labs  09/17/14 1651  BNP 41.0    ProBNP (last 3 results)  Recent Labs  12/07/13 2221   PROBNP 56.9    CBG:  Recent Labs Lab 09/22/14 2014 09/23/14 1253  GLUCAP 91 101*    Recent Results (from the past 240 hour(s))  Urine culture     Status: None   Collection Time: 09/17/14  4:29 PM  Result Value Ref Range Status   Specimen Description URINE, CATHETERIZED  Final   Special Requests NONE  Final   Colony Count >=100,000 COLONIES/ML  Final   Culture   Final    ACINETOBACTER CALCOACETICUS/BAUMANNII COMPLEX KLEBSIELLA PNEUMONIAE Note: Confirmed Extended Spectrum Beta-Lactamase Producer (ESBL) CRITICAL RESULT CALLED TO, READ BACK BY AND VERIFIED WITH: Carin Hock RN on 09/22/14 at 02:05 by Rise Mu    Report Status 09/22/2014 FINAL  Final   Organism ID, Bacteria ACINETOBACTER CALCOACETICUS/BAUMANNII COMPLEX  Final   Organism ID, Bacteria KLEBSIELLA PNEUMONIAE  Final      Susceptibility   Klebsiella pneumoniae - MIC*    AMPICILLIN >=32 RESISTANT Resistant     CEFAZOLIN >=64 RESISTANT Resistant     CEFTRIAXONE >=64 RESISTANT Resistant     CIPROFLOXACIN >=4 RESISTANT Resistant     GENTAMICIN >=16 RESISTANT Resistant     LEVOFLOXACIN >=8 RESISTANT Resistant     NITROFURANTOIN 256 RESISTANT Resistant     TOBRAMYCIN 2 SENSITIVE Sensitive     TRIMETH/SULFA >=320 RESISTANT Resistant     IMIPENEM <=0.25 SENSITIVE Sensitive     PIP/TAZO >=128 RESISTANT Resistant     * KLEBSIELLA PNEUMONIAE   Acinetobacter calcoaceticus/baumannii complex - MIC*    CEFTRIAXONE Value in next row Resistant      >=64 RESISTANTPerformed at [facility]    CIPROFLOXACIN Value in next row Resistant      >=4 RESISTANTPerformed at [facility]    GENTAMICIN Value in next row Sensitive      <=1 SENSITIVEPerformed at [facility]    PIP/TAZO Value in next row Resistant      >=128 RESISTANTPerformed at [facility]    TOBRAMYCIN Value in next row Sensitive      <=1 SENSITIVEPerformed at [facility]    TRIMETH/SULFA Value in next row Resistant      >=320  RESISTANTPerformed at [facility]    LEVOFLOXACIN Value in next row Resistant      >=8 RESISTANTPerformed at [place] Value in next row Resistant      >=512 RESISTANTPerformed at [facility]    IMIPENEM Value in next row Resistant      >=16 RESISTANTPerformed at [facility]    * ACINETOBACTER CALCOACETICUS/BAUMANNII COMPLEX  Blood Culture (routine x 2)     Status: None   Collection Time: 09/17/14  4:40 PM  Result Value Ref Range Status   Specimen Description BLOOD RIGHT ARM  Final   Special Requests BOTTLES DRAWN AEROBIC AND ANAEROBIC 10CC  Final   Culture   Final    NO GROWTH 5 DAYS Performed at [facility]    Report Status 09/24/2014 FINAL  Final  Blood Culture (routine x  2)     Status: None   Collection Time: 09/17/14  4:55 PM  Result Value Ref Range Status   Specimen Description BLOOD LEFT HAND  Final   Special Requests BOTTLES DRAWN AEROBIC ONLY 5CC  Final   Culture   Final    NO GROWTH 5 DAYS Performed at [facility]    Report Status 09/24/2014 FINAL  Final  MRSA PCR Screening     Status: Abnormal   Collection Time: 09/17/14  8:10 PM  Result Value Ref Range Status   MRSA by PCR POSITIVE (A) NEGATIVE Final    Comment:        The GeneXpert MRSA Assay (FDA approved for NASAL specimens only), is one component of a comprehensive MRSA colonization surveillance program. It is not intended to diagnose MRSA infection nor to guide or monitor treatment for MRSA infections. RESULT CALLED TO, READ BACK BY AND VERIFIED WITH: PETTIFORD,A RN [phone] 09/18/14 MITCHELL,L   Culture, Urine     Status: None (Preliminary result)   Collection Time: 09/22/14  9:02 PM  Result Value Ref Range Status   Specimen Description URINE, CATHETERIZED  Final   Special Requests NONE  Final   Colony Count   Final    15,000 COLONIES/ML Performed at [facility]    Culture   Final    ENTEROCOCCUS SPECIES Performed at  [facility]    Report Status PENDING  Incomplete  Culture, blood (routine x 2)     Status: None (Preliminary result)   Collection Time: 09/22/14 11:50 PM  Result Value Ref Range Status   Specimen Description BLOOD LEFT ARM  Final   Special Requests BOTTLES DRAWN AEROBIC AND ANAEROBIC 3CC  Final   Culture   Final           BLOOD CULTURE RECEIVED NO GROWTH TO DATE CULTURE WILL BE HELD FOR 5 DAYS BEFORE ISSUING A FINAL NEGATIVE REPORT Performed at [facility]    Report Status PENDING  Incomplete  Culture, blood (routine x 2)     Status: None (Preliminary result)   Collection Time: 09/22/14 11:56 PM  Result Value Ref Range Status   Specimen Description BLOOD LEFT HAND  Final   Special Requests BOTTLES DRAWN AEROBIC ONLY 2CC  Final   Culture   Final           BLOOD CULTURE RECEIVED NO GROWTH TO DATE CULTURE WILL BE HELD FOR 5 DAYS BEFORE ISSUING A FINAL NEGATIVE REPORT Performed at [facility]    Report Status PENDING  Incomplete     Studies: Ct Abdomen Pelvis Wo Contrast  09/25/2014   CLINICAL DATA:  Hydronephrosis  EXAM: CT ABDOMEN AND PELVIS WITHOUT CONTRAST  TECHNIQUE: Multidetector CT imaging of the abdomen and pelvis was performed following the standard protocol without IV contrast.  COMPARISON:  09/19/2014  FINDINGS: BODY WALL: Symmetric gynecomastia  LOWER CHEST: Decreasing airspace disease in the right lower lobe. Clustered centrilobular nodules in the right middle lobe are unchanged and could be chronic.  Small hiatal hernia.  ABDOMEN/PELVIS:  Liver: Hepatic steatosis.  Biliary: Distended gallbladder with calcified gallstones. There is no evidence of acute inflammation.  Pancreas: Unremarkable.  Spleen: Unremarkable.  Adrenals: Unremarkable.  Kidneys and ureters: Moderate bilateral hydroureteronephrosis, despite decompressed bladder. Nonobstructive stones seen in the left lower pole, up to 5 mm.  Bladder: Irregular thickening of the decompressed bladder,  suspicious for tumor. A Foley catheter remains in good position.  Reproductive: Unremarkable.  Bowel: Descending  colostomy with unchanged tissue in the low pelvis. Prominent volume of formed stool throughout the colon. No evidence of bowel obstruction. No inflammatory bowel wall thickening.  Retroperitoneum: Re- demonstrated right external iliac chain lymphadenopathy, including a 27 x 42 mm mass. No periaortic lymphadenopathy.  Peritoneum: No ascites or pneumoperitoneum.  Vascular: No acute abnormality.  OSSEOUS: Sacrificed spinal catheter fragments noted. There is extensive spinal fusion and sacral truncation. The patient is status post left hip disarticulation and right intertrochanteric femur fracture ORIF. 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  IMPRESSION: 1. 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 2. Right pelvic adenopathy compatible with metastatic disease. 3. Improving pneumonia in the right lower lobe. 4. Chronic findings are stable from prior and noted above.   Electronically Signed   By: [name] M.D.   On: 09/25/2014 05:05   Ct Head Wo Contrast  09/23/2014   CLINICAL DATA:  Took Ativan to sleep last night. Not responding this morning. Altered mental status.  EXAM: CT HEAD WITHOUT CONTRAST  TECHNIQUE: Contiguous axial images were obtained from the base of the skull through the vertex without intravenous contrast.  COMPARISON:  12/07/2013  FINDINGS: Patient was uncooperative for the study which had to be repeated several times due to motion. No visible acute intracranial abnormality. No visible acute infarction. No hemorrhage or hydrocephalus. No extra-axial fluid collection. Paranasal sinuses and mastoids are clear. No acute calvarial abnormality.  IMPRESSION: Typical, somewhat limited study due to patient motion. Study was repeated several times. No visible acute intracranial abnormality. With the multiple attempts, the study is felt to be diagnostic.    Electronically Signed   By: Rolm Baptise M.D.   On: 09/23/2014 11:12    Scheduled Meds: . antiseptic oral rinse  7 mL Mouth Rinse BID  . atorvastatin  10 mg Oral QHS  . calcium carbonate  1 tablet Oral Once  . feeding supplement (ENSURE COMPLETE)  237 mL Oral BID BM  . lamoTRIgine  200 mg Oral BID  . levothyroxine  62.5 mcg Intravenous QAC breakfast  . magnesium sulfate 1 - 4 g bolus IVPB  3 g Intravenous Once  . metoprolol  2.5 mg Intravenous 4 times per day  . oxybutynin  5 mg Oral Daily  . pantoprazole (PROTONIX) IV  40 mg Intravenous Q12H  . potassium chloride  10 mEq Intravenous Q1 Hr x 4  . potassium chloride  40 mEq Oral Q4H  . predniSONE  10 mg Oral Q breakfast  . QUEtiapine  100 mg Oral BID  . saccharomyces boulardii  250 mg Oral BID  . sodium chloride  3 mL Intravenous Q12H  . valproate sodium  500 mg Intravenous 3 times per day    Time spent 45 mins   Porscha Axley M.D. [facility] 09/25/2014, 8:12 AM  Pager: [phone]

## 2014-09-25 NOTE — Progress Notes (Signed)
Pt seen after arriving to [facility] from [facility] stepdown. 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 Still having hematuria with blood clots. Heparin drip held by Dr Tana [place]. Bladder irrigation as needed. C/o cough as well and appears sleepy. Noted for elevated BP. Continue prn hydralazine.  low k and mg replenished earlier today. Will recheck labs this evening. NPO after midnight for retrograde cystoscopy and urethral stent placement for b/l hydronephrosis in am.

## 2014-09-25 NOTE — Progress Notes (Signed)
Reuben Likes, RN called and updated on pt's status and medications given. RN verbalizes understanding. Pt left with [facility]

## 2014-09-25 NOTE — Progress Notes (Signed)
Clinical Social Work Department CLINICAL SOCIAL WORK PSYCHIATRY SERVICE LINE ASSESSMENT 09/25/2014  Patient:  Samuel [name]  Account:  [id number]  [place] Date:  09/17/2014  Clinical Social Worker:  Sindy Messing, LCSW  Date/Time:  09/25/2014 01:30 PM Referred by:  Physician  Date referred:  09/25/2014 Reason for Referral  Psychosocial assessment   Presenting Symptoms/Problems (In the person's/family's own words):   Psych consulted due to suicidal ideations.   Abuse/Neglect/Trauma History (check all that apply)  Denies history   Abuse/Neglect/Trauma Comments:   Psychiatric History (check all that apply)  Denies history   Psychiatric medications:  Lamictal 200 mg  Seroquel 100 mg   Current Mental Health Hospitalizations/Previous Mental Health History:   Patient denies any previous MH diagnosis. Per chart review, patient has a history of bipolar disorder. Patient denies any current depression or current MH concerns.   Current provider:   None currently   Place and Date:   N/A   Current Medications:   Scheduled Meds:      . antiseptic oral rinse  7 mL Mouth Rinse BID  . atorvastatin  10 mg Oral QHS  . feeding supplement (ENSURE COMPLETE)  237 mL Oral BID BM  . lamoTRIgine  200 mg Oral BID  . levothyroxine  62.5 mcg Intravenous QAC breakfast  . magnesium sulfate 1 - 4 g bolus IVPB  3 g Intravenous Once  . metoprolol  2.5 mg Intravenous 4 times per day  . oxybutynin  5 mg Oral Daily  . pantoprazole (PROTONIX) IV  40 mg Intravenous Q12H  . potassium chloride  40 mEq Oral Q4H  . predniSONE  10 mg Oral Q breakfast  . pregabalin  75 mg Oral TID  . QUEtiapine  100 mg Oral BID  . saccharomyces boulardii  250 mg Oral BID  . sodium chloride  3 mL Intravenous Q12H        Continuous Infusions:      . dextrose 5 % with KCl 20 mEq / L 20 mEq (09/25/14 1200)    . heparin 1,650 Units/hr (09/25/14 0203)          PRN Meds:.acetaminophen **OR** acetaminophen, albuterol, calcium  carbonate, gi cocktail, haloperidol lactate, hydrALAZINE, HYDROmorphone (DILAUDID) injection, LORazepam, ondansetron **OR** ondansetron (ZOFRAN) IV, polyvinyl alcohol, sodium chloride       Previous Impatient Admission/Date/Reason:   None reported   Emotional Health / Current Symptoms    Suicide/Self Harm  Suicidal ideation (ex: "I can't take any more,I wish I could disappear")   Suicide attempt in the past:   Patient reports he made a statement about not wanting to live anymore. Patient reports this was a misunderstanding and that he would never try to harm himself. Patient denies any SI or HI and denies any previous attempts.   Other harmful behavior:   None reported   Psychotic/Dissociative Symptoms  None reported   Other Psychotic/Dissociative Symptoms:   N/A    Attention/Behavioral Symptoms  Within Normal Limits   Other Attention / Behavioral Symptoms:   Patient engaged during assessment.    Cognitive Impairment  Within Normal Limits   Other Cognitive Impairment:   Patient alert and oriented.    Mood and Adjustment  Mood Congruent    Stress, Anxiety, Trauma, Any Recent Loss/Stressor  Relationship   Anxiety (frequency):   N/A   Phobia (specify):   N/A   Compulsive behavior (specify):   N/A   Obsessive behavior (specify):   N/A   Other:   Patient reports he moved  to [facility] to be close to dtrs but they do not come to visit often and are minimally engaged in his life.   Substance Abuse/Use  None   SBIRT completed (please refer for detailed history):  N  Self-reported substance use:   Patient denies any substance use.   Urinary Drug Screen Completed:  N Alcohol level:   N/A    Environmental/Housing/Living Arrangement  SKILLED NURSING FACILITY   Who is in the home:   [place]   Emergency contact:  [name]-dtr   Financial  Medicare  Medicaid   Patient's Strengths and Goals (patient's own words):   Patient reports he enjoys  his facility and wants to return at DC.   Clinical Social Worker's Interpretive Summary:   CSW received referral in order to complete psychosocial assessment. CSW reviewed chart and met with patient at bedside. CSW introduced myself and explained role.    Patient reports he is currently a resident at [facility] but enjoys living at his facility. Patient reports he has a private room and asked to be transferred here to be closer to his dtrs that live in [place]. Patient reports he was divorced in 2000 but has two grown dtrs from marriage. Patient reports that he was hopeful if he moved closer then dtrs would visit more often but reports that dtrs are too busy and often ignore him. Patient reports he does get upset and discouraged when dtrs say that they will visit but do not follow through.    CSW inquired about patient's emotional status. Per chart review, patient has a history of bipolar disorder but patient denies any MH concerns and denies any previous treatment. MD had documented that patient made a statement about not wanting to live anymore. CSW inquired about patient's statement. Patient reports he has had chronic medical problems and does get frustrated with wanting to feel better. Patient reports he was misunderstood and never had any intentions on harming himself. Patient reports he has never experienced any SI or HI in the past and never attempted to harm himself. Patient reports he plans on getting better so that he can return to SNF but does not feel he needs any psychiatric treatment at this time.    CSW explained that psych MD would evaluate patient as well and make and recommendations for DC planning. Patient engaged during assessment and open to discussing his family and personal life. Patient either forgetful about previous MH diagnosis or guarded when discussing treatment. Patient contracts for safety and adamant that he would never do anything to harm himself.   Disposition:   Recommend Psych CSW continuing to support while in hospital   [place], [place] [phone]

## 2014-09-25 NOTE — Progress Notes (Signed)
CARE MANAGEMENT NOTE 09/25/2014  Patient:  Samuel [name], Samuel [name]   Account Number:  [id number]  Date Initiated:  09/21/2014  Documentation initiated by:  Tomi Bamberger  Subjective/Objective Assessment:   dx hcap, sepsis, arf  admit- from [facility].     Action/Plan:   Anticipated DC Date:  09/28/2014   Anticipated DC Plan:  SKILLED NURSING FACILITY  In-house referral  Clinical Social Worker      DC Planning Services  CM consult      Choice offered to / List presented to:             Status of service:  Completed, signed off Medicare Important Message given?  YES (If response is "NO", the following Medicare IM given date fields will be blank) Date Medicare IM given:  09/21/2014 Medicare IM given by:  Tomi Bamberger Date Additional Medicare IM given:  09/25/2014 Additional Medicare IM given by:  Elissa Hefty  Discharge Disposition:    Per UR Regulation:  Reviewed for med. necessity/level of care/duration of stay  If discussed at [place] of Stay Meetings, dates discussed:   09/26/2014    Comments:  Feb. 29 2016/Samuel [name] L. Samuel Hoes, RN, BSN, CCM. Case Management [place] [phone] No discharge needs present of time of review.  patient rec[facility], to have turp when stable is now on continuous bladder irrigations, has mad e suuicidal commnets has a sitter/ k+ 2.0 getting K+ runs/hematuria/ hx of parapelgia/  09/21/14 [name] BSN [phone] patient is from [facility], CSW following.

## 2014-09-25 NOTE — Procedures (Signed)
ELECTROENCEPHALOGRAM REPORT  Patient: Samuel [name]       Room #: 2330 EEG No. ID: [id number] Age: 59 y.o.        Sex: male Referring Physician: RAI, R Report Date:  09/25/2014        Interpreting Physician: Anthony Sar  History: [name] is an 59 y.o. male with HCAP, UTI and bilateral hydronephrosis, with recurrent spells of altered consciousness of unclear etiology. No seizure activities been described.  Indications for study:  Rule out encephalopathy; rule out seizure activity.  Technique: This is an 18 channel routine scalp EEG performed at the bedside with bipolar and monopolar montages arranged in accordance to the international 10/20 system of electrode placement.   Description: This EEG recording was performed during wakefulness. Predominant background activity consisted of 9-10 Hz symmetrical alpha rhythm which attenuated well with eye opening. Photic stimulation produced symmetrical occipital driving response. Hyperventilation was not performed. No epileptiform discharges were recorded. There was no abnormal slowing of cerebral activity.  Interpretation: This is a normal EEG recording during wakefulness. No evidence of an encephalopathic disorder was demonstrated. Please be aware of the normal EEG recording does not rule out seizure disorder.   Rush Farmer M.D. [facility] [phone]

## 2014-09-25 NOTE — Progress Notes (Signed)
Pharmacy - Perioperative Tobramycin  Assessment:  58yoM admitted to [facility] for HCAP, now transferred to [facility] 2/29 for his urologic procedures scheduled for 3/1. He has grown enterococcus, Acinetobacter and Klebsiella from recent urine cultures. ID physician discussed antibiotic prophylaxis with Dr. Eda Keys of surgery and would like the patient to receive one dose of tobramycin before and after his procedures.  SCr 2.04; CrCl 45 CG; 40 normalized  Plan:  Tobramycin 400 mg IV x 1 on call tomorrow prior to surgery  Will order additional 400 mg IV x 1 tomorrow to be given 24 hr after 1st dose  F/u renal function, timing of pre-op dose  Reuel Boom, PharmD Pager: [phone] 09/25/2014, 5:12 PM

## 2014-09-26 ENCOUNTER — Inpatient Hospital Stay (HOSPITAL_COMMUNITY): Payer: Medicare Other | Admitting: Anesthesiology

## 2014-09-26 ENCOUNTER — Encounter (HOSPITAL_COMMUNITY)
Admission: EM | Disposition: A | Discharge status: Skilled Nursing Facility | Payer: Self-pay | Source: Home / Self Care | Attending: Internal Medicine

## 2014-09-26 ENCOUNTER — Encounter (HOSPITAL_COMMUNITY): Payer: Self-pay | Admitting: Anesthesiology

## 2014-09-26 DIAGNOSIS — R569 Unspecified convulsions: Secondary | ICD-10-CM

## 2014-09-26 DIAGNOSIS — R45851 Suicidal ideations: Secondary | ICD-10-CM

## 2014-09-26 DIAGNOSIS — G40909 Epilepsy, unspecified, not intractable, without status epilepticus: Secondary | ICD-10-CM

## 2014-09-26 DIAGNOSIS — F319 Bipolar disorder, unspecified: Secondary | ICD-10-CM

## 2014-09-26 HISTORY — PX: CYSTOSCOPY WITH RETROGRADE PYELOGRAM, URETEROSCOPY AND STENT PLACEMENT: SHX5789

## 2014-09-26 HISTORY — PX: TRANSURETHRAL RESECTION OF BLADDER TUMOR: SHX2575

## 2014-09-26 LAB — PROTIME-INR
INR: 1.14 (ref 0.00–1.49)
INR: 1.15 (ref 0.00–1.49)
PROTHROMBIN TIME: 14.7 s (ref 11.6–15.2)
PROTHROMBIN TIME: 14.8 s (ref 11.6–15.2)

## 2014-09-26 LAB — CBC
HCT: 24.8 % — ABNORMAL LOW (ref 39.0–52.0)
HEMOGLOBIN: 8.3 g/dL — AB (ref 13.0–17.0)
MCH: 27.1 pg (ref 26.0–34.0)
MCHC: 33.5 g/dL (ref 30.0–36.0)
MCV: 81 fL (ref 78.0–100.0)
Platelets: 199 10*3/uL (ref 150–400)
RBC: 3.06 MIL/uL — ABNORMAL LOW (ref 4.22–5.81)
RDW: 15.2 % (ref 11.5–15.5)
WBC: 11.3 10*3/uL — AB (ref 4.0–10.5)

## 2014-09-26 LAB — COMPREHENSIVE METABOLIC PANEL
ALBUMIN: 3 g/dL — AB (ref 3.5–5.2)
ALK PHOS: 49 U/L (ref 39–117)
ALT: 12 U/L (ref 0–53)
AST: 17 U/L (ref 0–37)
Anion gap: 7 (ref 5–15)
BILIRUBIN TOTAL: 0.6 mg/dL (ref 0.3–1.2)
BUN: 20 mg/dL (ref 6–23)
CHLORIDE: 102 mmol/L (ref 96–112)
CO2: 24 mmol/L (ref 19–32)
CREATININE: 2.24 mg/dL — AB (ref 0.50–1.35)
Calcium: 7.3 mg/dL — ABNORMAL LOW (ref 8.4–10.5)
GFR calc non Af Amer: 31 mL/min — ABNORMAL LOW (ref >90)
GFR, EST AFRICAN AMERICAN: 36 mL/min — AB (ref >90)
GLUCOSE: 117 mg/dL — AB (ref 70–99)
POTASSIUM: 2.9 mmol/L — AB (ref 3.5–5.1)
Sodium: 133 mmol/L — ABNORMAL LOW (ref 135–145)
TOTAL PROTEIN: 5.8 g/dL — AB (ref 6.0–8.3)

## 2014-09-26 LAB — BASIC METABOLIC PANEL
ANION GAP: 9 (ref 5–15)
BUN: 20 mg/dL (ref 6–23)
CHLORIDE: 104 mmol/L (ref 96–112)
CO2: 24 mmol/L (ref 19–32)
Calcium: 7.8 mg/dL — ABNORMAL LOW (ref 8.4–10.5)
Creatinine, Ser: 2.27 mg/dL — ABNORMAL HIGH (ref 0.50–1.35)
GFR calc Af Amer: 35 mL/min — ABNORMAL LOW (ref >90)
GFR calc non Af Amer: 30 mL/min — ABNORMAL LOW (ref >90)
GLUCOSE: 123 mg/dL — AB (ref 70–99)
Potassium: 2.9 mmol/L — ABNORMAL LOW (ref 3.5–5.1)
SODIUM: 137 mmol/L (ref 135–145)

## 2014-09-26 LAB — PSA: PSA: 0.82 ng/mL (ref <=4.00)

## 2014-09-26 LAB — APTT
aPTT: 36 seconds (ref 24–37)
aPTT: 32 seconds (ref 24–37)

## 2014-09-26 LAB — HEPARIN LEVEL (UNFRACTIONATED): Heparin Unfractionated: <0.1 IU/mL — ABNORMAL LOW (ref 0.30–0.70)

## 2014-09-26 SURGERY — TURBT (TRANSURETHRAL RESECTION OF BLADDER TUMOR)
Anesthesia: General | Site: Ureter

## 2014-09-26 MED ORDER — ROCURONIUM BROMIDE 100 MG/10ML IV SOLN
INTRAVENOUS | Status: DC | PRN
  Administered 2014-09-26: 50 mg via INTRAVENOUS
  Administered 2014-09-26: 25 mg via INTRAVENOUS

## 2014-09-26 MED ORDER — POTASSIUM CHLORIDE CRYS ER 20 MEQ PO TBCR
40.0000 meq | EXTENDED_RELEASE_TABLET | Freq: Once | ORAL | Status: AC
  Administered 2014-09-26: 40 meq via ORAL
  Filled 2014-09-26: qty 2

## 2014-09-26 MED ORDER — FENTANYL CITRATE 0.05 MG/ML IJ SOLN
INTRAMUSCULAR | Status: AC
  Filled 2014-09-26: qty 5

## 2014-09-26 MED ORDER — GLYCOPYRROLATE 0.2 MG/ML IJ SOLN
INTRAMUSCULAR | Status: AC
  Filled 2014-09-26: qty 3

## 2014-09-26 MED ORDER — LACTATED RINGERS IV SOLN
INTRAVENOUS | Status: DC | PRN
  Administered 2014-09-26 (×2): via INTRAVENOUS

## 2014-09-26 MED ORDER — SODIUM CHLORIDE 0.9 % IJ SOLN
INTRAMUSCULAR | Status: AC
  Filled 2014-09-26: qty 10

## 2014-09-26 MED ORDER — NEOSTIGMINE METHYLSULFATE 10 MG/10ML IV SOLN
INTRAVENOUS | Status: DC | PRN
  Administered 2014-09-26: 3 mg via INTRAVENOUS

## 2014-09-26 MED ORDER — FAMOTIDINE IN NACL 20-0.9 MG/50ML-% IV SOLN
20.0000 mg | Freq: Once | INTRAVENOUS | Status: AC
  Administered 2014-09-26: 20 mg via INTRAVENOUS
  Filled 2014-09-26: qty 50

## 2014-09-26 MED ORDER — TOBRAMYCIN SULFATE 80 MG/2ML IJ SOLN: 400.0000 mg | INTRAVENOUS | Status: DC

## 2014-09-26 MED ORDER — LIDOCAINE HCL 2 % EX GEL
CUTANEOUS | Status: AC
  Filled 2014-09-26: qty 10

## 2014-09-26 MED ORDER — LIDOCAINE HCL (CARDIAC) 20 MG/ML IV SOLN
INTRAVENOUS | Status: AC
  Filled 2014-09-26: qty 5

## 2014-09-26 MED ORDER — EPHEDRINE SULFATE 50 MG/ML IJ SOLN
INTRAMUSCULAR | Status: AC
  Filled 2014-09-26: qty 1

## 2014-09-26 MED ORDER — HYDROMORPHONE HCL 1 MG/ML IJ SOLN: 0.2500 mg | INTRAMUSCULAR | Status: DC | PRN

## 2014-09-26 MED ORDER — MIDAZOLAM HCL 2 MG/2ML IJ SOLN
INTRAMUSCULAR | Status: AC
  Filled 2014-09-26: qty 2

## 2014-09-26 MED ORDER — CEFAZOLIN SODIUM-DEXTROSE 2-3 GM-% IV SOLR
INTRAVENOUS | Status: AC
  Filled 2014-09-26: qty 50

## 2014-09-26 MED ORDER — FENTANYL CITRATE 0.05 MG/ML IJ SOLN
INTRAMUSCULAR | Status: DC | PRN
  Administered 2014-09-26 (×2): 25 ug via INTRAVENOUS
  Administered 2014-09-26 (×2): 50 ug via INTRAVENOUS
  Administered 2014-09-26: 25 ug via INTRAVENOUS

## 2014-09-26 MED ORDER — LIDOCAINE HCL 1 % IJ SOLN
INTRAMUSCULAR | Status: DC | PRN
  Administered 2014-09-26: 20 mg via INTRADERMAL
  Administered 2014-09-26: 50 mg via INTRADERMAL

## 2014-09-26 MED ORDER — MIDAZOLAM HCL 5 MG/5ML IJ SOLN
INTRAMUSCULAR | Status: DC | PRN
  Administered 2014-09-26 (×2): 1 mg via INTRAVENOUS

## 2014-09-26 MED ORDER — SODIUM CHLORIDE 0.9 % IR SOLN
Status: DC | PRN
  Administered 2014-09-26 (×7): 3000 mL
  Administered 2014-09-26: 1000 mL
  Administered 2014-09-26: 3000 mL

## 2014-09-26 MED ORDER — PROPOFOL 10 MG/ML IV BOLUS
INTRAVENOUS | Status: DC | PRN
  Administered 2014-09-26: 30 mg via INTRAVENOUS
  Administered 2014-09-26: 200 mg via INTRAVENOUS

## 2014-09-26 MED ORDER — PROPOFOL 10 MG/ML IV BOLUS
INTRAVENOUS | Status: AC
  Filled 2014-09-26: qty 20

## 2014-09-26 MED ORDER — PHENYLEPHRINE HCL 10 MG/ML IJ SOLN
INTRAMUSCULAR | Status: DC | PRN
  Administered 2014-09-26 (×2): 40 ug via INTRAVENOUS
  Administered 2014-09-26: 80 ug via INTRAVENOUS

## 2014-09-26 MED ORDER — NEOSTIGMINE METHYLSULFATE 10 MG/10ML IV SOLN
INTRAVENOUS | Status: AC
  Filled 2014-09-26: qty 1

## 2014-09-26 MED ORDER — PHENYLEPHRINE 40 MCG/ML (10ML) SYRINGE FOR IV PUSH (FOR BLOOD PRESSURE SUPPORT)
PREFILLED_SYRINGE | INTRAVENOUS | Status: AC
  Filled 2014-09-26: qty 10

## 2014-09-26 MED ORDER — LACTATED RINGERS IV SOLN: INTRAVENOUS | Status: DC

## 2014-09-26 MED ORDER — ONDANSETRON HCL 4 MG/2ML IJ SOLN
INTRAMUSCULAR | Status: DC | PRN
  Administered 2014-09-26: 4 mg via INTRAVENOUS

## 2014-09-26 MED ORDER — SODIUM CHLORIDE 0.9 % IV SOLN
10.0000 mg | INTRAVENOUS | Status: DC | PRN
  Administered 2014-09-26: 15 ug/min via INTRAVENOUS

## 2014-09-26 MED ORDER — ONDANSETRON HCL 4 MG/2ML IJ SOLN
INTRAMUSCULAR | Status: AC
  Filled 2014-09-26: qty 2

## 2014-09-26 MED ORDER — METOCLOPRAMIDE HCL 5 MG/ML IJ SOLN
INTRAMUSCULAR | Status: DC | PRN
  Administered 2014-09-26: 10 mg via INTRAVENOUS

## 2014-09-26 MED ORDER — GLYCOPYRROLATE 0.2 MG/ML IJ SOLN
INTRAMUSCULAR | Status: DC | PRN
  Administered 2014-09-26: 0.4 mg via INTRAVENOUS

## 2014-09-26 MED ORDER — EPHEDRINE SULFATE 50 MG/ML IJ SOLN
INTRAMUSCULAR | Status: DC | PRN
  Administered 2014-09-26: 5 mg via INTRAVENOUS

## 2014-09-26 MED ORDER — PHENAZOPYRIDINE HCL 200 MG PO TABS
200.0000 mg | ORAL_TABLET | Freq: Once | ORAL | Status: AC
  Administered 2014-09-26: 200 mg via ORAL
  Filled 2014-09-26: qty 1

## 2014-09-26 MED ORDER — TOBRAMYCIN SULFATE 80 MG/2ML IJ SOLN
400.0000 mg | Freq: Once | INTRAVENOUS | Status: AC
  Administered 2014-09-27: 400 mg via INTRAVENOUS
  Filled 2014-09-26: qty 10

## 2014-09-26 SURGICAL SUPPLY — 40 items
BAG URINE DRAINAGE (UROLOGICAL SUPPLIES) IMPLANT
BAG URO CATCHER STRL LF (DRAPE) ×4 IMPLANT
BASKET LASER NITINOL 1.9FR (BASKET) IMPLANT
BASKET STNLS GEMINI 4WIRE 3FR (BASKET) IMPLANT
BASKET ZERO TIP NITINOL 2.4FR (BASKET) IMPLANT
BRUSH URET BIOPSY 3F (UROLOGICAL SUPPLIES) IMPLANT
CATH FOLEY 2WAY 5CC 20FR (CATHETERS) ×4 IMPLANT
CATH INTERMIT  6FR 70CM (CATHETERS) ×4 IMPLANT
CLOTH BEACON ORANGE TIMEOUT ST (SAFETY) ×4 IMPLANT
DRAPE CAMERA CLOSED 9X96 (DRAPES) IMPLANT
ELECT BIVAP BIPO 22/24 DONUT (ELECTROSURGICAL) ×4
ELECTRD BIVAP BIPO 22/24 DONUT (ELECTROSURGICAL) ×2 IMPLANT
FIBER LASER FLEXIVA 1000 (UROLOGICAL SUPPLIES) IMPLANT
FIBER LASER FLEXIVA 200 (UROLOGICAL SUPPLIES) IMPLANT
FIBER LASER FLEXIVA 365 (UROLOGICAL SUPPLIES) IMPLANT
FIBER LASER FLEXIVA 550 (UROLOGICAL SUPPLIES) IMPLANT
FIBER LASER TRAC TIP (UROLOGICAL SUPPLIES) IMPLANT
GLOVE BIO SURGEON STRL SZ 6.5 (GLOVE) ×3 IMPLANT
GLOVE BIO SURGEONS STRL SZ 6.5 (GLOVE) ×1
GLOVE BIOGEL M STRL SZ7.5 (GLOVE) ×8 IMPLANT
GOWN STRL REUS W/TWL LRG LVL3 (GOWN DISPOSABLE) ×4 IMPLANT
GOWN STRL REUS W/TWL XL LVL3 (GOWN DISPOSABLE) ×8 IMPLANT
GUIDEWIRE ANG ZIPWIRE 038X150 (WIRE) ×4 IMPLANT
GUIDEWIRE STR DUAL SENSOR (WIRE) ×4 IMPLANT
IV NS IRRIG 3000ML ARTHROMATIC (IV SOLUTION) IMPLANT
KIT BALLIN UROMAX 15FX10 (LABEL) IMPLANT
KIT BALLN UROMAX 15FX4 (MISCELLANEOUS) IMPLANT
KIT BALLN UROMAX 26 75X4 (MISCELLANEOUS)
LOOP CUT BIPOLAR 24F LRG (ELECTROSURGICAL) ×4 IMPLANT
MANIFOLD NEPTUNE II (INSTRUMENTS) ×4 IMPLANT
PACK CYSTO (CUSTOM PROCEDURE TRAY) ×8 IMPLANT
SET HIGH PRES BAL DIL (LABEL)
SHEATH ACCESS URETERAL 24CM (SHEATH) IMPLANT
SHEATH ACCESS URETERAL 54CM (SHEATH) IMPLANT
SHIELD EYE BINOCULAR (MISCELLANEOUS) IMPLANT
STENT CONTOUR 6FRX26X.038 (STENTS) ×4 IMPLANT
SYRINGE 12CC LL (MISCELLANEOUS) ×4 IMPLANT
SYRINGE IRR TOOMEY STRL 70CC (SYRINGE) ×8 IMPLANT
TUBING CONNECTING 10 (TUBING) ×3 IMPLANT
TUBING CONNECTING 10' (TUBING) ×1

## 2014-09-26 NOTE — Progress Notes (Signed)
Clinical Social Work  CSW went to room to meet with patient. Bedside RN reports patient left for OR. CSW staffed case with psych MD and will continue to follow.  [place], [place] [phone]
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
## 2014-09-26 NOTE — Transfer of Care (Signed)
Immediate Anesthesia Transfer of Care Note  Patient: Samuel [name]  Procedure(s) Performed: Procedure(s): TRANSURETHRAL RESECTION OF BLADDER TUMOR (TURBT) (N/A) BILATERAL RETROGRADE PYELOGRAM AND URETERAL STENT PLACEMENT (Bilateral)  Patient Location: PACU  Anesthesia Type:General  Level of Consciousness: awake, alert  and oriented  Airway & Oxygen Therapy: Patient Spontanous Breathing and Patient connected to face mask oxygen  Post-op Assessment: Report given to RN and Post -op Vital signs reviewed and stable  Post vital signs: Reviewed and stable  Last Vitals:  Filed Vitals:   09/26/14 0836  BP:   Pulse:   Temp: 36.8 C  Resp:     Complications: No apparent anesthesia complications

## 2014-09-26 NOTE — Op Note (Signed)
Preoperative diagnosis: Bladder neoplasm, bilateral hydroureteronephrosis, acute renal failure, gross hematuria Postoperative diagnosis: Same  Procedure: Exam under anesthesia, Cystoscopy, TURBT greater than 5 cm, Bilateral retrograde pyelogram, Bilateral ureteral stent placement  Surgeon: [name]  Anesthesia: [place]  Type of anesthesia: Gen.  Findings: On exam under anesthesia the penis was normal without mass or lesion. He had developed a distal hypospadias from the catheter. Testicles were descended bilaterally and palpably normal. No obvious bladder masses palpable on abdominal exam but patient has a lot of prior scarring  in the abdominal wall is rather thickened fixed.   On cystoscopy there was some irregular appearance of the mucosa in the bulb which potentially could be tumor infiltration. In the bladder there is a large, solid, soft, friable high-grade tumor all along the left bladder wall which spread down along the trigone over beyond the right ureteral orifice. Some of the tumor also appeared to progress around the posterior bladder. Over the trigone and did thin out and I was able to resect this area and find both ureteral orifices. The tumor went to far superior to be resectable with endoscopy. I went up as far as I could which was about mid lesion resected from posterior to anterior and sent this to pathology.   Left retrograde pyelogram-this outlined a dilated proximal ureter with a dilated collecting system and calyces. There were no obvious filling defects. Hydronephrosis was moderate.   Right retrograde pyelogram-this outlined a dilated proximal ureter and a dilated renal pelvis and collecting system. There was severe hydronephrosis on the right. There were no obvious filling defects in the renal pelvis but the renal pelvis did not completely fill out due to the significant dilation.   Description of procedure: After consent was obtained patient brought to the operating  room. After adequate anesthesia he is placed in lithotomy position. All of the right leg was put in lithotomy. 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 An exam under anesthesia was performed. He was prepped and draped in the usual sterile fashion. The cystoscope was passed per urethra and some clot evacuated. The bladder was inspected. I could not readily from the ureteral orifices. The resectoscope loop was then passed and I was able to go along the trigone and resect a superficial layer tumor. I did send some of this is trigone. I was able to uncover what I thought was the right ureteral orifice which was quite dilated and then the left ureteral orifice. I then went to the mid tumor and resected from posterior anterior mid tumor down to the inferior portion of the tumor. This was sent as bladder tumor. I fulgurated the area with the loop but also the button but the tissue was so soft and friable he did not coagulate very well. I did the best I could to fulgurate all resected areas plus some of the other vessels. I then removed the resectoscope and replaced the cystoscope.   Using a sensor wire was able to cannulate the left ureteral orifice but and [place] about to the mid ureter. This was removed and I re-cannulated with a angled glide. This went up and coiled in the collecting system. 6 [place] open-ended catheter was advanced into the region of the left proximal ureter. The wire was removed and there was a good hydronephrotic drip. The wire was replaced and a 6 x 26 and ureter stent was advanced. The wire was removed with a good coil seen in the renal pelvis and a good coil reconstituting in the bladder.  Attention was turned to the right ureteral orifice which was cannulated with a angled glide and coiled in the upper pole collecting system. The 6 [place] open-ended catheter was advanced proximally and the wire removed. Again there was a good hydronephrotic drip. Retrograde injection of contrast was performed. The wire was  repassed. The 626 stent was then advanced. The wire was removed with a good coil seen in the right renal pelvis and a good coil in the bladder.  The bladder was drained and the scope removed. I placed a 20 [place] coud catheter. Hematuria was light. This was left gravity drainage.  The patient was awakened taken to recovery room in stable condition.  Complications: None Blood loss: Minimal  Specimens: #1 right trigone, #2 bladder tumor Specimens sent to pathology  Drains: Bilateral 6 x 26 cm ureteral stents, 20 French coud catheter  Disposition: Patient stable to PACU

## 2014-09-26 NOTE — Discharge Instructions (Signed)
Ureteral Stent Implantation, Care After Refer to this sheet in the next few weeks. These instructions provide you with information on caring for yourself after your procedure. Your health care provider may also give you more specific instructions. Your treatment has been planned according to current medical practices, but problems sometimes occur. Call your health care provider if you have any problems or questions after your procedure. WHAT TO EXPECT AFTER THE PROCEDURE You should be back to normal activity within 48 hours after the procedure. Nausea and vomiting may occur and are commonly the result of anesthesia. It is common to experience sharp pain in the back or lower abdomen. HOME CARE INSTRUCTIONS Make sure to drink plenty of fluids. You may have small amounts of bleeding, causing your urine to be red. This is normal. Certain movements may trigger pain or a feeling that you need to urinate. You may be given medicines to prevent infection or bladder spasms. Be sure to take all medicines as directed. Only take over-the-counter or prescription medicines for pain, discomfort, or fever as directed by your health care provider. Do not take aspirin, as this can make bleeding worse. Your stents will be left in until the blockage is resolved. This may take 2 weeks or longer, depending on the reason for stent implantation. You may have an X-ray exam to make sure your ureter is open and that the stent has not moved out of position (migrated). The stent can be removed by your health care provider in the office. Medicines may be given for comfort while the stent is being removed. Be sure to keep all follow-up appointments so your health care provider can check that you are healing properly. SEEK MEDICAL CARE IF:  You experience increasing pain.  Your pain medicine is not working. SEEK IMMEDIATE MEDICAL CARE IF:  Your urine is dark red or has blood clots.  You are leaking urine (incontinent).  You have  a fever, chills, feeling sick to your stomach (nausea), or vomiting.  Your pain is not relieved by pain medicine.  The end of the stent comes out of the urethra.  You are unable to urinate. Document Released: 03/16/2013 Document Revised: 07/19/2013 Document Reviewed: 03/16/2013 [facility] Patient Information 2015 [place], [place]. This information is not intended to replace advice given to you by your health care provider. Make sure you discuss any questions you have with your health care provider.

## 2014-09-26 NOTE — Progress Notes (Signed)
Day of Surgery   Pt stable post-op. Urine light red. I irrigated foley. Clear in 2 flushes and no clots. Staying clear.  Discussed situation of unresectable bladder mass and if primary bladder would need chemo +/- radiation or +/- cystectomy. He said he would not want a cystectomy because it would be "too many bags".     LOS: 9 days   Samuel [name] 09/26/2014, 5:46 PM

## 2014-09-26 NOTE — Progress Notes (Signed)
Irrigated foley per protocol x 3 with 50 ml Sterile water. With return of small amount of blood clots, Patient tolerated procedure without difficulty.

## 2014-09-26 NOTE — Anesthesia Postprocedure Evaluation (Signed)
  Anesthesia Post-op Note  Patient: Samuel [name]  Procedure(s) Performed: Procedure(s) (LRB): TRANSURETHRAL RESECTION OF BLADDER TUMOR (TURBT) (N/A) BILATERAL RETROGRADE PYELOGRAM AND URETERAL STENT PLACEMENT (Bilateral)  Patient Location: PACU  Anesthesia Type: General  Level of Consciousness: awake and alert   Airway and Oxygen Therapy: Patient Spontanous Breathing  Post-op Pain: mild  Post-op Assessment: Post-op Vital signs reviewed, Patient's Cardiovascular Status Stable, Respiratory Function Stable, Patent Airway and No signs of Nausea or vomiting  Last Vitals:  Filed Vitals:   09/26/14 1455  BP: 146/69  Pulse: 87  Temp:   Resp: 18    Post-op Vital Signs: stable   Complications: No apparent anesthesia complications

## 2014-09-26 NOTE — Progress Notes (Signed)
Pt c/o gross hematuria.   Filed Vitals:   09/26/14 0836  BP:   Pulse:   Temp: 98.2 F (36.8 C)  Resp:     Intake/Output Summary (Last 24 hours) at 09/26/14 1101 Last data filed at 09/26/14 0851  Gross per 24 hour  Intake 3062.42 ml  Output   3100 ml  Net -37.58 ml     PE: Pt in NAD GU - urine light hematuria  A/P: -bladder mass, bilateral hydronephrosis, gross hematuria - needs cysto, bilateral RGP, ureteral stents and TURBT.  -BMP, PT/PTT pending. Heparin and coumadin off.  -pre-op tobramycin, cefazolin and po pyridium all in process of going in.

## 2014-09-26 NOTE — Progress Notes (Signed)
PACU note: continuing contact isolation

## 2014-09-26 NOTE — Progress Notes (Signed)
ANTICOAGULATION CONSULT NOTE - Follow Up Consult  Pharmacy Consult for Heparin Indication: pulmonary embolus, history  Allergies  Allergen Reactions  . Tomato Other (See Comments)    Unknown reaction    Patient Measurements: Height: 5\' 5"  (165.1 cm) Weight: 251 lb 1.7 oz (113.9 kg) IBW/kg (Calculated) : 61.5  Vital Signs: Temp: 98.5 F (36.9 C) (03/01 0400) Temp Source: Oral (03/01 0400) BP: 156/83 mmHg (03/01 0400) Pulse Rate: 96 (03/01 0655)  Labs:  Recent Labs  09/24/14 0549  09/24/14 2023 09/25/14 0355 09/25/14 0356 09/25/14 1830 09/25/14 2359 09/26/14 0500  HGB 8.6*  --   --  9.0*  --   --  8.3*  --   HCT 25.7*  --   --  26.7*  --   --  24.8*  --   PLT 171  --   --  187  --   --  199  --   APTT  --   --   --   --   --   --  36  --   LABPROT 15.9*  --   --   --  14.8  --  14.8  --   INR 1.25  --   --   --  1.14  --  1.15  --   HEPARINUNFRC  --   < > 0.38  --  0.43  --   --  <0.10*  CREATININE  --   --   --  2.04*  --  1.99* 2.24*  --   < > = values in this interval not displayed.  Estimated Creatinine Clearance: 42.5 mL/min (by C-G formula based on Cr of 2.24).   Medications:  Prescriptions prior to admission  Medication Sig Dispense Refill Last Dose  . acetaminophen (TYLENOL) 325 MG tablet Take 650 mg by mouth every 6 (six) hours as needed for moderate pain.   09/14/2014  . alum & mag hydroxide-simeth (MAALOX/MYLANTA) 200-200-20 MG/5ML suspension Take 20 mLs by mouth 2 (two) times daily as needed for indigestion or heartburn.   09/15/2014  . amLODipine (NORVASC) 5 MG tablet Take 1 tablet (5 mg total) by mouth at bedtime.   09/16/2014 at Unknown time  . atorvastatin (LIPITOR) 10 MG tablet Take 10 mg by mouth at bedtime.    09/16/2014 at Unknown time  . baclofen (LIORESAL) 20 MG tablet Take 20 mg by mouth 3 (three) times daily. 9am, 2pm, 9pm   09/17/2014 at 900  . calcium carbonate (OS-CAL - DOSED IN MG OF ELEMENTAL CALCIUM) 1250 MG tablet Take 1 tablet by mouth  daily with breakfast.   09/17/2014 at Unknown time  . calcium carbonate (TUMS - DOSED IN MG ELEMENTAL CALCIUM) 500 MG chewable tablet Chew 4 tablets by mouth every 4 (four) hours as needed for indigestion or heartburn.    08/31/2014  . chlorpheniramine (CHLOR-TRIMETON) 4 MG tablet Take 8 mg by mouth at bedtime as needed for allergies (cough).   unknown  . clonazePAM (KLONOPIN) 0.5 MG tablet Take one tablet by mouth three times daily for anxiety 90 tablet 5 09/17/2014 at Unknown time  . clonazePAM (KLONOPIN) 1 MG tablet Take one tablet by mouth every 12 hours as needed for anxiety (Patient taking differently: Take 1 mg by mouth every 12 (twelve) hours as needed for anxiety. Also takes 0.5 mg 3 times daily) 60 tablet 5 unknown  . dexlansoprazole (DEXILANT) 60 MG capsule Take 60 mg by mouth 2 (two) times daily.    09/17/2014 at am  .  DULoxetine (CYMBALTA) 60 MG capsule Take 60 mg by mouth at bedtime.   09/16/2014 at Unknown time  . famotidine (PEPCID) 20 MG tablet Take 20 mg by mouth at bedtime.   09/16/2014 at Unknown time  . fentaNYL (DURAGESIC - DOSED MCG/HR) 50 MCG/HR Place 1 patch (50 mcg total) onto the skin every 3 (three) days. Remove old patch, external use only, rotate sites, check placement every shift 10 patch 0 09/13/2014  . ferrous sulfate 325 (65 FE) MG tablet Take 325 mg by mouth 2 (two) times daily with a meal.   3/53/6144 at am  . folic acid (FOLVITE) 1 MG tablet Take 1 mg by mouth daily.    09/17/2014 at Unknown time  . GENERLAC 10 GM/15ML SOLN Take 20 g by mouth 4 (four) times daily. 9am, 1pm, 5pm, 9pm   09/17/2014 at 1300  . guaiFENesin (ROBITUSSIN) 100 MG/5ML liquid Take 300 mg by mouth 3 (three) times daily as needed for cough.    09/17/2014 at Unknown time  . HYDROcodone-acetaminophen (NORCO/VICODIN) 5-325 MG per tablet Take 1-2 tablets by mouth every 6 (six) hours as needed for moderate pain or severe pain.   09/17/2014 at 1000  . ipratropium-albuterol (DUONEB) 0.5-2.5 (3) MG/3ML SOLN Take 3  mLs by nebulization See admin instructions. Inhale 1 vial 4 times daily for lung disease; may also take every 4 hours as needed for wheezing/ congestion   09/17/2014 at 1300  . lamoTRIgine (LAMICTAL) 200 MG tablet Take 200 mg by mouth 2 (two) times daily. To stabilize bipolar mood   09/17/2014 at am  . levothyroxine (SYNTHROID, LEVOTHROID) 125 MCG tablet Take 125 mcg by mouth daily before breakfast.   09/17/2014 at Unknown time  . LORazepam (ATIVAN) 0.5 MG tablet Take one tablet by mouth once daily as needed for anxiety (Patient taking differently: Take 0.5 mg by mouth daily as needed for anxiety. ) 30 tablet 5 unknown  . LORazepam (ATIVAN) 2 MG/ML injection Inject 0.7ml intramuscularly daily as needed for anxiety (Patient taking differently: Inject 0.5 mg into the muscle daily as needed for anxiety. ) 15 mL 0 unknown  . meclizine (ANTIVERT) 25 MG tablet Take 1 tablet (25 mg total) by mouth 3 (three) times daily as needed for dizziness. 30 tablet 0 unknown  . metoCLOPramide (REGLAN) 5 MG tablet Take 5 mg by mouth 4 (four) times daily -  before meals and at bedtime.    09/17/2014 at noon  . nitroGLYCERIN (NITROSTAT) 0.4 MG SL tablet Place 0.4 mg under the tongue every 5 (five) minutes as needed for chest pain. Up to 3 doses   unknown  . ondansetron (ZOFRAN) 4 MG tablet Take 4 mg by mouth 3 (three) times daily as needed for nausea or vomiting.   unknown  . oxybutynin (DITROPAN-XL) 5 MG 24 hr tablet Take 5 mg by mouth daily.    09/17/2014 at Unknown time  . oxyCODONE (ROXICODONE) 15 MG immediate release tablet Take one tablet by mouth three times daily as needed for moderate to severe pain (Patient taking differently: Take 15 mg by mouth 3 (three) times daily as needed (moderate to severe pain). ) 90 tablet 0 09/14/2014  . phenol (CHLORASEPTIC) 1.4 % LIQD Use as directed 2 sprays in the mouth or throat every 3 (three) hours as needed for throat irritation / pain.   unknown  . Polyethyl Glycol-Propyl Glycol  (SYSTANE OP) Place 2 drops into both eyes 3 (three) times daily. 8am, 2pm, 8pm   09/17/2014 at 1400  .  potassium chloride (MICRO-K) 10 MEQ CR capsule Take 10 mEq by mouth daily.    09/17/2014 at Unknown time  . predniSONE (DELTASONE) 10 MG tablet Take 10 mg by mouth daily with breakfast.   09/17/2014 at Unknown time  . pregabalin (LYRICA) 75 MG capsule Take one capsule by mouth twice daily for pain (Patient taking differently: Take 75 mg by mouth 2 (two) times daily. ) 60 capsule 5 09/17/2014 at am  . promethazine (PHENERGAN) 12.5 MG tablet Take 12.5 mg by mouth every 6 (six) hours as needed for nausea or vomiting.   unknown  . QUEtiapine (SEROQUEL) 200 MG tablet Take 200 mg by mouth 2 (two) times daily.   09/17/2014 at 900  . saccharomyces boulardii (FLORASTOR) 250 MG capsule Take 250 mg by mouth 2 (two) times daily.   09/17/2014 at am  . senna-docusate (SENOKOT-S) 8.6-50 MG per tablet Take 1 tablet by mouth 2 (two) times daily.    09/17/2014 at am  . vitamin B-12 (CYANOCOBALAMIN) 1000 MCG tablet Take 1,000 mcg by mouth every other day.    09/16/2014 at Unknown time  . Vitamin D, Ergocalciferol, (DRISDOL) 50000 UNITS CAPS capsule Take 50,000 Units by mouth every 30 (thirty) days. Takes on the 11th of the month   09/07/2014  . warfarin (COUMADIN) 4 MG tablet Take 4 mg by mouth See admin instructions. Take 1 tablet (4 mg) Monday, Wednesday, Friday, Saturday at 5pm (take 4.5 mg on Tuesday, Thursday and Sunday)   09/16/2014 at Unknown time  . WARFARIN SODIUM PO Take 4.5 mg by mouth See admin instructions. Take 4.5 mg on Tuesday, Thursday, Sunday at 5pm, (take 4 mg on Monday, Wednesday, Friday and Saturday)   09/14/2014  . amoxicillin-clavulanate (AUGMENTIN) 875-125 MG per tablet Take 1 tablet by mouth 2 (two) times daily with a meal. 8 day course to start 09/18/14 (after procedure)   not yet started  . cephALEXin (KEFLEX) 250 MG capsule Take 2 capsules (500 mg total) by mouth 2 (two) times daily. (Patient not taking:  Reported on 09/17/2014) 14 capsule 0 Not Taking at Unknown time  . fentaNYL (DURAGESIC - DOSED MCG/HR) 75 MCG/HR Place 1 patch (75 mcg total) onto the skin every 3 (three) days. *Remove old Patch*Rotate Site* (Patient not taking: Reported on 08/25/2014) 10 patch 0 Not Taking at Unknown time    Assessment: 58yoM [facility] with cough and SOB.  PTA warfarin resumed on admission for Hx PE, but became supratherapeutic and Vit K given on 2/26.  INR subsequently became subtherapeutic.  On 2/27, patient converted to heparin to bridge perioperatively for urologic procedure on 3/1. Pharmacy consulted to dose heparin while off warfarin pending trip to OR.  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  Txfer to [facility] for urologic procedure.  Around this time new hematuria noted and heparin drip stopped.  No plans to resume prior to procedure scheduled for 3/1 AM.  Today, 09/26/2014: Heparin remains off in anticipation of procedure today.  RN noted small amount of blood clots after foley irrigation x 3 INR from 2/29 PM remains subtherapeutic CBC: Hgb low but stable, plt wnl  Goal of Therapy:  Heparin level 0.3-0.7 units/ml Monitor platelets by anticoagulation protocol: Yes  Plan:  F/u plans for resumption of heparin +/- warfarin after procedure F/u daily CBC, INR  F/u for resolution of hematuria   Reuel Boom, PharmD Pager: [phone] 09/26/2014, 8:26 AM

## 2014-09-26 NOTE — Progress Notes (Signed)
[facility] PROGRESS NOTE  Tel Hevia UVO:[id number] DOB: Apr 03, 1956 DOA: 09/17/2014 PCP: [name], MD  Interim Summary Patient is a pleasant 59 year old male with past medical history paraplegia, neurogenic bladder, chronic indwelling Foley catheter, status post left lower extremity amputation, presented as a transfer from his skilled nursing facility to the emergency department on 09/17/2014 with complaints of increasing cough and shortness of breath. Workup included a chest x-ray which revealed left lower lobe infiltrate. Initial lab showed white count of 13,600 which trended up to 20,000 on the following day. He was started on broad-spectrum IV antimicrobial therapy with meropenem and vancomycin for presumed healthcare associated pneumonia. Patient was also found to have acute kidney injury presenting with a creatinine 1.92. Previous lab work on 08/25/2014 showed a creatinine 1.0. He was started on IV fluids. Renal ultrasound showed the presence of bilateral hydronephrosis. Patient was continuing to have hematuria. Urine cultures showed Acinetobacter and Klebsiella.  2/27 patient noted to be acutely encephalopathic, not interacting at all, jerky movements. Overnight had a BP of 207/106,was transferred to stepdown unit. In SDU, patient had a generalized tonic-clonic seizure and patient was placed on Depakote by neurology. Neurology also felt that patient most likely has metabolic encephalopathy secondary to meropenem and medication effect. All the narcotics including fentanyl patch and antibiotics were discontinued. Patient was noted to have postictal phase, polyuria and rising creatinine. Patient is being closely followed by neurology ( Dr. Doy Mince), ID (Dr. [name]), urology ( Dr. [name]), and nephrology, Dr. Melvia Heaps.  2/29 patient is alert and oriented, interactive , back to his baseline, creatinine function has been improving. Dr. [name], from urology service will  schedule TURBT bladder tumor and ureteral stent at [facility] on 3/1 AM.    Assessment and plan   Acute encephalopathy likely due to seizure - Appears to be improved. Awake and alert.  - workup so far negative, CT head negative, EEG did not show any epileptiform activity. - meropenem and narcotics were discontinued - neurology was consulted. Patient was initially placed on Depakote. Now patient is on Lamictal and workup. Depakote was discontinued.  Seizure episode - No further seizures. Now on Lamictal and Lyrica. Neurology has been following - EEG does not reveal any epileptiform activity. No other abnormalities noted.   Healthcare associated pneumonia/ left lower lobe infiltrates -All antibiotics discontinued following acute encephalopathy/seizure, ID following. Patient has received appropriate course of meropenem for HCAP, has urinary colonization with drug-resistant Klebsiella and Acinetobacter.  -Blood cultures negative so far.   Klebsiella and Acinetobacter UTI - Patient has a history of chronic Foley catheter due to neurogenic bladder,has chronic colonization. - Meropenem has been discontinued. ID, Dr. [name] recommended pharmacy to give preoperative dose of tobramycin before his upcoming urological procedure.  Acute kidney injury with bilateral hydronephrosis. -Renal function is stable. Nephrology has been following. Potassium was low yesterday and was repeated. This morning's levels are still pending.   Hematuria/bilateral hydronephrosis possibly due to bladder wall thickening and tumor- currently hematuria clear -Patient on chronic anticoagulation presented with hematuria initially felt to be secondary to recent Foley catheter exchange. -Renal ultrasound showed the presence of bilateral hydronephrosis, CT scan of the abdomen and pelvis showed moderate to severe left hydronephrosis with bladder wall thickening and possible tumor.  - Urology is following. Patient is scheduled  for TURBT for bladder tumor and ureteral stent 3/1 at [facility]. Patient was subsequently transferred to [facility].  History of pulmonary embolism after surgery for left hip fracture in 2015. - Holding Coumadin  for urological procedure, continue IV heparin drip - Patient had IVC filter placement in 09/22/2013 for large PE but subsequently was removed, confirmed per PCP notes, Dr. Dellia Nims (note on 09/05/14).  Reviewed PCP notes, patient was recommended lifelong Coumadin, possibly due to his multiple comorbidities and paraplegic state.  Hypokalemia, hypomagnesemia - This was being aggressively repleted. Morning labs are pending.   Suicide ideation   During this hospitalization patient mentioned that he wanted to 'kill himself and get out of this mess', one-to-one sitter was placed. Psychiatry is following. Will continue sitter  Essential hypertension Continue amlodipine.  Acute blood loss anemia due to hematuria Continue to monitor. Transfuse as needed.  DVT prophylaxis: He is on full anticoagulation. Code Status: Full code Family Communication: Discussed with the patient's daughter yesterday.  Disposition Plan: Await urological procedure. Will remain in step down for now.  Consultants:  ID- Dr [name]  Renal: Dr Jonnie Finner  Urology: Dr [name]  Neurology: Dr Doy Mince. Dr Leonel Ramsay  Antibiotics:  Vancomycin (started on 09/17/2014)  discontinued 2/26  Meropenem (started on 09/17/2014) discontinued  2/27  Subjective: Patient denies any pain. He is alert, distracted. Anxious about his upcoming procedure.    Objective:  BP 156/83 mmHg  Pulse 96  Temp(Src) 98.5 F (36.9 C) (Oral)  Resp 16  Ht 5\' 5"  (1.651 m)  Wt 114.1 kg (251 lb 8.7 oz)  BMI 41.86 kg/m2  SpO2 93%   Intake/Output Summary (Last 24 hours) at 09/26/14 0734 Last data filed at 09/26/14 0505  Gross per 24 hour  Intake 3795.42 ml  Output   2150 ml  Net 1645.42 ml   Filed Weights   09/23/14 1700 09/24/14 0306  09/25/14 0540  Weight: 114.8 kg (253 lb 1.4 oz) 112.8 kg (248 lb 10.9 oz) 114.1 kg (251 lb 8.7 oz)    Exam:   General: Alert and awake,oriented 3. Distracted.   CVS: Regular rate and rhythm, normal S1 and S2.   Resp: Decreased breath sound at the bases otherwise no wheezing, rales or rhonchi   Abdomen: Obese, soft nontender nondistended, status post ostomy placement  Extremities: Status post left AKA   GU: Hematuria, Foley catheter  Data: Basic Metabolic Panel:  Recent Labs Lab 09/22/14 0600 09/23/14 0332 09/25/14 0355 09/25/14 1830 09/25/14 2359  NA 142 146* 131* 136 133*  K 3.7 3.3* 2.0* 2.6* 2.9*  CL 109 110 99 102 102  CO2 26 27 26 25 24   GLUCOSE 96 107* 111* 147* 117*  BUN 19 19 18 21 20   CREATININE 2.31* 2.34* 2.04* 1.99* 2.24*  CALCIUM 8.4 8.6 7.2* 7.6* 7.3*  MG  --   --  1.3* 2.5  --    Liver Function Tests:  Recent Labs Lab 09/23/14 1307 09/25/14 2359  AST 20 17  ALT 12 12  ALKPHOS 59 49  BILITOT 1.1 0.6  PROT 7.0 5.8*  ALBUMIN 3.3* 3.0*    Recent Labs Lab 09/23/14 1307  AMMONIA 22   CBC:  Recent Labs Lab 09/22/14 0600 09/23/14 0332 09/24/14 0549 09/25/14 0355 09/25/14 2359  WBC 10.5 10.0 13.6* 11.4* 11.3*  HGB 9.1* 8.9* 8.6* 9.0* 8.3*  HCT 28.5* 27.2* 25.7* 26.7* 24.8*  MCV 81.2 80.5 79.6 79.7 81.0  PLT 228 169 171 187 199   BNP (last 3 results)  Recent Labs  09/17/14 1651  BNP 41.0    CBG:  Recent Labs Lab 09/22/14 2014 09/23/14 1253  GLUCAP 91 101*    Recent Results (from the past 240 hour(s))  Urine culture     Status: None   Collection Time: 09/17/14  4:29 PM  Result Value Ref Range Status   Specimen Description URINE, CATHETERIZED  Final   Special Requests NONE  Final   Colony Count >=100,000 COLONIES/ML  Final   Culture   Final    ACINETOBACTER CALCOACETICUS/BAUMANNII COMPLEX KLEBSIELLA PNEUMONIAE Note: Confirmed Extended Spectrum Beta-Lactamase Producer (ESBL) CRITICAL RESULT CALLED TO, READ BACK BY  AND VERIFIED WITH: Carin Hock RN on 09/22/14 at 02:05 by Rise Mu    Report Status 09/22/2014 FINAL  Final   Organism ID, Bacteria ACINETOBACTER CALCOACETICUS/BAUMANNII COMPLEX  Final   Organism ID, Bacteria KLEBSIELLA PNEUMONIAE  Final      Susceptibility   Klebsiella pneumoniae - MIC*    AMPICILLIN >=32 RESISTANT Resistant     CEFAZOLIN >=64 RESISTANT Resistant     CEFTRIAXONE >=64 RESISTANT Resistant     CIPROFLOXACIN >=4 RESISTANT Resistant     GENTAMICIN >=16 RESISTANT Resistant     LEVOFLOXACIN >=8 RESISTANT Resistant     NITROFURANTOIN 256 RESISTANT Resistant     TOBRAMYCIN 2 SENSITIVE Sensitive     TRIMETH/SULFA >=320 RESISTANT Resistant     IMIPENEM <=0.25 SENSITIVE Sensitive     PIP/TAZO >=128 RESISTANT Resistant     * KLEBSIELLA PNEUMONIAE   Acinetobacter calcoaceticus/baumannii complex - MIC*    CEFTRIAXONE Value in next row Resistant      >=64 RESISTANTPerformed at [facility]    CIPROFLOXACIN Value in next row Resistant      >=4 RESISTANTPerformed at [facility]    GENTAMICIN Value in next row Sensitive      <=1 SENSITIVEPerformed at [facility]    PIP/TAZO Value in next row Resistant      >=128 RESISTANTPerformed at [facility]    TOBRAMYCIN Value in next row Sensitive      <=1 SENSITIVEPerformed at [facility]    TRIMETH/SULFA Value in next row Resistant      >=320 RESISTANTPerformed at [facility]    LEVOFLOXACIN Value in next row Resistant      >=8 RESISTANTPerformed at [place] Value in next row Resistant      >=512 RESISTANTPerformed at [facility]    IMIPENEM Value in next row Resistant      >=16 RESISTANTPerformed at [place]  Blood Culture (routine x 2)     Status: None   Collection Time: 09/17/14  4:40 PM  Result Value Ref Range Status   Specimen Description BLOOD RIGHT ARM  Final   Special  Requests BOTTLES DRAWN AEROBIC AND ANAEROBIC 10CC  Final   Culture   Final    NO GROWTH 5 DAYS Performed at [facility]    Report Status 09/24/2014 FINAL  Final  Blood Culture (routine x 2)     Status: None   Collection Time: 09/17/14  4:55 PM  Result Value Ref Range Status   Specimen Description BLOOD LEFT HAND  Final   Special Requests BOTTLES DRAWN AEROBIC ONLY 5CC  Final   Culture   Final    NO GROWTH 5 DAYS Performed at [facility]    Report Status 09/24/2014 FINAL  Final  MRSA PCR Screening     Status: Abnormal   Collection Time: 09/17/14  8:10 PM  Result Value Ref Range Status   MRSA by PCR POSITIVE (A) NEGATIVE Final    Comment:  The GeneXpert MRSA Assay (FDA approved for NASAL specimens only), is one component of a comprehensive MRSA colonization surveillance program. It is not intended to diagnose MRSA infection nor to guide or monitor treatment for MRSA infections. RESULT CALLED TO, READ BACK BY AND VERIFIED WITH: PETTIFORD,A RN [phone] 09/18/14 MITCHELL,L   Culture, Urine     Status: None   Collection Time: 09/22/14  9:02 PM  Result Value Ref Range Status   Specimen Description URINE, CATHETERIZED  Final   Special Requests NONE  Final   Colony Count   Final    15,000 COLONIES/ML Performed at [facility]    Culture   Final    ENTEROCOCCUS SPECIES Performed at [facility]    Report Status 09/25/2014 FINAL  Final   Organism ID, Bacteria ENTEROCOCCUS SPECIES  Final      Susceptibility   Enterococcus species - MIC*    AMPICILLIN <=2 SENSITIVE Sensitive     LEVOFLOXACIN >=8 RESISTANT Resistant     NITROFURANTOIN <=16 SENSITIVE Sensitive     VANCOMYCIN 1 SENSITIVE Sensitive     TETRACYCLINE >=16 RESISTANT Resistant     * ENTEROCOCCUS SPECIES  Culture, blood (routine x 2)     Status: None (Preliminary result)   Collection Time: 09/22/14 11:50 PM  Result Value Ref Range Status   Specimen Description BLOOD LEFT ARM   Final   Special Requests BOTTLES DRAWN AEROBIC AND ANAEROBIC 3CC  Final   Culture   Final           BLOOD CULTURE RECEIVED NO GROWTH TO DATE CULTURE WILL BE HELD FOR 5 DAYS BEFORE ISSUING A FINAL NEGATIVE REPORT Performed at [facility]    Report Status PENDING  Incomplete  Culture, blood (routine x 2)     Status: None (Preliminary result)   Collection Time: 09/22/14 11:56 PM  Result Value Ref Range Status   Specimen Description BLOOD LEFT HAND  Final   Special Requests BOTTLES DRAWN AEROBIC ONLY 2CC  Final   Culture   Final           BLOOD CULTURE RECEIVED NO GROWTH TO DATE CULTURE WILL BE HELD FOR 5 DAYS BEFORE ISSUING A FINAL NEGATIVE REPORT Performed at [facility]    Report Status PENDING  Incomplete     Studies: Ct Abdomen Pelvis Wo Contrast  09/25/2014   CLINICAL DATA:  Hydronephrosis  EXAM: CT ABDOMEN AND PELVIS WITHOUT CONTRAST  TECHNIQUE: Multidetector CT imaging of the abdomen and pelvis was performed following the standard protocol without IV contrast.  COMPARISON:  09/19/2014  FINDINGS: BODY WALL: Symmetric gynecomastia  LOWER CHEST: Decreasing airspace disease in the right lower lobe. Clustered centrilobular nodules in the right middle lobe are unchanged and could be chronic.  Small hiatal hernia.  ABDOMEN/PELVIS:  Liver: Hepatic steatosis.  Biliary: Distended gallbladder with calcified gallstones. There is no evidence of acute inflammation.  Pancreas: Unremarkable.  Spleen: Unremarkable.  Adrenals: Unremarkable.  Kidneys and ureters: Moderate bilateral hydroureteronephrosis, despite decompressed bladder. Nonobstructive stones seen in the left lower pole, up to 5 mm.  Bladder: Irregular thickening of the decompressed bladder, suspicious for tumor. A Foley catheter remains in good position.  Reproductive: Unremarkable.  Bowel: Descending colostomy with unchanged tissue in the low pelvis. Prominent volume of formed stool throughout the colon. No evidence of bowel  obstruction. No inflammatory bowel wall thickening.  Retroperitoneum: Re- demonstrated right external iliac chain lymphadenopathy, including a 27 x 42 mm mass. No periaortic lymphadenopathy.  Peritoneum: No ascites or pneumoperitoneum.  Vascular: No acute abnormality.  OSSEOUS: Sacrificed spinal catheter fragments noted. There is extensive spinal fusion and sacral truncation. The patient is status post left hip disarticulation and right intertrochanteric femur fracture ORIF. 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  IMPRESSION: 1. 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 2. Right pelvic adenopathy compatible with metastatic disease. 3. Improving pneumonia in the right lower lobe. 4. Chronic findings are stable from prior and noted above.   Electronically Signed   By: [name] M.D.   On: 09/25/2014 05:05    Scheduled Meds: . antiseptic oral rinse  7 mL Mouth Rinse BID  . atorvastatin  10 mg Oral QHS  .  ceFAZolin (ANCEF) IV  2 g Intravenous 30 min Pre-Op  . feeding supplement (ENSURE COMPLETE)  237 mL Oral BID BM  . lamoTRIgine  200 mg Oral BID  . levothyroxine  62.5 mcg Intravenous QAC breakfast  . metoprolol  2.5 mg Intravenous 4 times per day  . oxybutynin  5 mg Oral Daily  . pantoprazole (PROTONIX) IV  40 mg Intravenous Q12H  . phenazopyridine  100 mg Oral Once  . predniSONE  10 mg Oral Q breakfast  . pregabalin  75 mg Oral TID  . QUEtiapine  100 mg Oral BID  . saccharomyces boulardii  250 mg Oral BID  . sodium chloride  3 mL Intravenous Q12H  . tobramycin  400 mg Intravenous On Call    Time spent 35 mins   Latwan Luchsinger M.D. [facility] 09/26/2014, 7:34 AM  Pager: [phone]

## 2014-09-26 NOTE — Anesthesia Preprocedure Evaluation (Addendum)
Anesthesia Evaluation  Patient identified by MRN, date of birth, ID band Patient awake    Reviewed: Allergy & Precautions, H&P , NPO status , Patient's Chart, lab work & pertinent test results, reviewed documented beta blocker date and time   Airway Mallampati: II  TM Distance: >3 FB Neck ROM: full    Dental  (+) Missing, Poor Dentition, Dental Advisory Given,  Front upper tooth missing:   Pulmonary neg pulmonary ROS, shortness of breath and at rest, pneumonia -, unresolved, former smoker, PE Remote history PE. On coumadin breath sounds clear to auscultation  Pulmonary exam normal       Cardiovascular hypertension, On Medications and Pt. on medications Rhythm:regular     Neuro/Psych Seizures -, Poorly Controlled,  PSYCHIATRIC DISORDERS Bipolar Disorder L-1 parapelgia. Recent seizures with encephalopathy  Neuromuscular disease CVA, Residual Symptoms    GI/Hepatic GERD-  Medicated and Controlled,(+)     substance abuse  alcohol use, Hepatitis -, C  Endo/Other  negative endocrine ROSMorbid obesity  Renal/GU Renal InsufficiencyRenal disease  negative genitourinary   Musculoskeletal   Abdominal (+) + obese,  Abdomen: soft. Bowel sounds: normal.  Peds  Hematology  (+) anemia ,   Anesthesia Other Findings See surgeon's H&P .  Old trach noted.  Waldron Session, CRNA  Reproductive/Obstetrics negative OB ROS                           Anesthesia Physical Anesthesia Plan  ASA: III  Anesthesia Plan: General   Post-op Pain Management:    Induction: Intravenous, Rapid sequence and Cricoid pressure planned  Airway Management Planned: Oral ETT  Additional Equipment:   Intra-op Plan:   Post-operative Plan: Extubation in OR  Informed Consent:   Plan Discussed with: Surgeon  Anesthesia Plan Comments:        Anesthesia Quick Evaluation

## 2014-09-26 NOTE — Progress Notes (Signed)
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 Path pending. He is high risk for bleeding and gross hematuria.   I was able to place bilateral ureteral stents. Hopefully renal function improves. Also a 20Fr foley.

## 2014-09-27 ENCOUNTER — Encounter (HOSPITAL_COMMUNITY): Payer: Self-pay | Admitting: Urology

## 2014-09-27 DIAGNOSIS — N1330 Unspecified hydronephrosis: Secondary | ICD-10-CM

## 2014-09-27 DIAGNOSIS — N179 Acute kidney failure, unspecified: Secondary | ICD-10-CM | POA: Insufficient documentation

## 2014-09-27 LAB — PROTIME-INR
INR: 1.14 (ref 0.00–1.49)
PROTHROMBIN TIME: 14.8 s (ref 11.6–15.2)

## 2014-09-27 LAB — BASIC METABOLIC PANEL
ANION GAP: 5 (ref 5–15)
BUN: 17 mg/dL (ref 6–23)
CHLORIDE: 104 mmol/L (ref 96–112)
CO2: 26 mmol/L (ref 19–32)
Calcium: 7.7 mg/dL — ABNORMAL LOW (ref 8.4–10.5)
Creatinine, Ser: 2.23 mg/dL — ABNORMAL HIGH (ref 0.50–1.35)
GFR calc Af Amer: 36 mL/min — ABNORMAL LOW (ref >90)
GFR calc non Af Amer: 31 mL/min — ABNORMAL LOW (ref >90)
GLUCOSE: 108 mg/dL — AB (ref 70–99)
POTASSIUM: 3.3 mmol/L — AB (ref 3.5–5.1)
SODIUM: 135 mmol/L (ref 135–145)

## 2014-09-27 LAB — CBC
HCT: 24.8 % — ABNORMAL LOW (ref 39.0–52.0)
Hemoglobin: 8 g/dL — ABNORMAL LOW (ref 13.0–17.0)
MCH: 27.1 pg (ref 26.0–34.0)
MCHC: 32.3 g/dL (ref 30.0–36.0)
MCV: 84.1 fL (ref 78.0–100.0)
Platelets: 211 10*3/uL (ref 150–400)
RBC: 2.95 MIL/uL — ABNORMAL LOW (ref 4.22–5.81)
RDW: 15.6 % — ABNORMAL HIGH (ref 11.5–15.5)
WBC: 12.1 10*3/uL — AB (ref 4.0–10.5)

## 2014-09-27 LAB — GLUCOSE, CAPILLARY: Glucose-Capillary: 106 mg/dL — ABNORMAL HIGH (ref 70–99)

## 2014-09-27 LAB — MAGNESIUM: MAGNESIUM: 2 mg/dL (ref 1.5–2.5)

## 2014-09-27 MED ORDER — SODIUM CHLORIDE 0.9 % IV BOLUS (SEPSIS)
250.0000 mL | Freq: Once | INTRAVENOUS | Status: AC
  Administered 2014-09-27: 250 mL via INTRAVENOUS

## 2014-09-27 MED ORDER — POTASSIUM CHLORIDE CRYS ER 20 MEQ PO TBCR
40.0000 meq | EXTENDED_RELEASE_TABLET | Freq: Once | ORAL | Status: AC
  Administered 2014-09-27: 40 meq via ORAL
  Filled 2014-09-27: qty 2

## 2014-09-27 MED ORDER — HYDROCODONE-HOMATROPINE 5-1.5 MG/5ML PO SYRP
5.0000 mL | ORAL_SOLUTION | Freq: Four times a day (QID) | ORAL | Status: DC | PRN
  Administered 2014-09-27 – 2014-10-01 (×7): 5 mL via ORAL
  Filled 2014-09-27 (×7): qty 5

## 2014-09-27 NOTE — Progress Notes (Signed)
CSW met with Pt along with psych MD. Pt reports being satisfied with living arrangement and expresses goal of gaining independence and live on his own. Pt reports that his daughters are a part of his support system in the community. He continues to deny that he is experiencing suicidal ideation and reports that his previous statement was a poor choice of words. Pt reports improving mood and has appropriate affect.   Staffed case with psych MD who reports no safety concerns at this time. CSW will continue to follow Pt through hospital stay.  Peri Maris, LCSWA 09/27/2014 11:40 AM

## 2014-09-27 NOTE — Progress Notes (Signed)
Nutrition Brief Note  Patient identified on the Malnutrition Screening Tool (MST) Report  Wt Readings from Last 15 Encounters:  09/27/14 253 lb 1.4 oz (114.8 kg)  07/03/14 255 lb 9.6 oz (115.939 kg)  01/13/14 240 lb (108.863 kg)  12/08/13 238 lb 8.6 oz (108.2 kg)  09/25/13 233 lb (105.688 kg)  09/01/13 243 lb 2.7 oz (110.3 kg)  03/03/13 259 lb 7 oz (117.68 kg)  12/10/12 247 lb (112.038 kg)  12/03/12 222 lb (100.699 kg)    Body mass index is 42.12 kg/(m^2). Patient meets criteria for morbid obesity based on current BMI.   Current diet order is regular, patient is consuming approximately 100% of meals at this time. Labs and medications reviewed.   No nutrition interventions warranted at this time. If nutrition issues arise, please consult RD.   Laurette Schimke Jobos, [place], [place]

## 2014-09-27 NOTE — Progress Notes (Signed)
1 Day Post-Op   Assessment/plan: -bladder neoplasm - s/p TURBT - Causing bilateral hydronephrosis and possible metastatic lymph node.  I would anticipate he'll need chemotherapy or at least consideration of it which that could be done as outpatient.  Okay from GU point review once patient is medically stable to DC home. -Bilateral hydronephrosis-status post bilateral ureteral stents.  Monitor urine output and creatinine.  Hopefully creatinine will improve some.   Subjective:  Patient reports no complaints.  Objective: Vital signs in last 24 hours: Temp:  [98 F (36.7 C)-99.2 F (37.3 C)] 98.7 F (37.1 C) (03/02 0800) Pulse Rate:  [36-103] 36 (03/02 0800) Resp:  [16-24] 19 (03/02 0800) BP: (89-155)/(55-106) 129/88 mmHg (03/02 0800) SpO2:  [93 %-98 %] 93 % (03/02 0800) Weight:  [114.8 kg (253 lb 1.4 oz)] 114.8 kg (253 lb 1.4 oz) (03/02 0616)  Intake/Output from previous day: 03/01 0701 - 03/02 0700 In: 2660 [P.O.:960; I.V.:1700] Out: 4910 [Urine:4860; Blood:50] Intake/Output this shift: Total I/O In: 3867.6 [I.V.:3867.6] Out: 1400 [Urine:1400]  Physical Exam:  GU: Urine clear  Lab Results:  Recent Labs  09/25/14 0355 09/25/14 2359 09/27/14 0345  HGB 9.0* 8.3* 8.0*  HCT 26.7* 24.8* 24.8*   BMET  Recent Labs  09/26/14 0735 09/27/14 0345  NA 137 135  K 2.9* 3.3*  CL 104 104  CO2 24 26  GLUCOSE 123* 108*  BUN 20 17  CREATININE 2.27* 2.23*  CALCIUM 7.8* 7.7*    Recent Labs  09/25/14 2359 09/26/14 1055 09/27/14 0345  INR 1.15 1.14 1.14   No results for input(s): LABURIN in the last 72 hours. Results for orders placed or performed during the hospital encounter of 09/17/14  Urine culture     Status: None   Collection Time: 09/17/14  4:29 PM  Result Value Ref Range Status   Specimen Description URINE, CATHETERIZED  Final   Special Requests NONE  Final   Colony Count >=100,000 COLONIES/ML  Final   Culture   Final    ACINETOBACTER  CALCOACETICUS/BAUMANNII COMPLEX KLEBSIELLA PNEUMONIAE Note: Confirmed Extended Spectrum Beta-Lactamase Producer (ESBL) CRITICAL RESULT CALLED TO, READ BACK BY AND VERIFIED WITH: Carin Hock RN on 09/22/14 at 02:05 by Rise Mu    Report Status 09/22/2014 FINAL  Final   Organism ID, Bacteria ACINETOBACTER CALCOACETICUS/BAUMANNII COMPLEX  Final   Organism ID, Bacteria KLEBSIELLA PNEUMONIAE  Final      Susceptibility   Klebsiella pneumoniae - MIC*    AMPICILLIN >=32 RESISTANT Resistant     CEFAZOLIN >=64 RESISTANT Resistant     CEFTRIAXONE >=64 RESISTANT Resistant     CIPROFLOXACIN >=4 RESISTANT Resistant     GENTAMICIN >=16 RESISTANT Resistant     LEVOFLOXACIN >=8 RESISTANT Resistant     NITROFURANTOIN 256 RESISTANT Resistant     TOBRAMYCIN 2 SENSITIVE Sensitive     TRIMETH/SULFA >=320 RESISTANT Resistant     IMIPENEM <=0.25 SENSITIVE Sensitive     PIP/TAZO >=128 RESISTANT Resistant     * KLEBSIELLA PNEUMONIAE   Acinetobacter calcoaceticus/baumannii complex - MIC*    CEFTRIAXONE Value in next row Resistant      >=64 RESISTANTPerformed at [facility]    CIPROFLOXACIN Value in next row Resistant      >=4 RESISTANTPerformed at [facility]    GENTAMICIN Value in next row Sensitive      <=1 SENSITIVEPerformed at [facility]    PIP/TAZO Value in next row Resistant      >=128 RESISTANTPerformed at [facility]  TOBRAMYCIN Value in next row Sensitive      <=1 SENSITIVEPerformed at [facility]    TRIMETH/SULFA Value in next row Resistant      >=320 RESISTANTPerformed at [facility]    LEVOFLOXACIN Value in next row Resistant      >=8 RESISTANTPerformed at [place] Value in next row Resistant      >=512 RESISTANTPerformed at [facility]    IMIPENEM Value in next row Resistant      >=16 RESISTANTPerformed at [place]  Blood Culture  (routine x 2)     Status: None   Collection Time: 09/17/14  4:40 PM  Result Value Ref Range Status   Specimen Description BLOOD RIGHT ARM  Final   Special Requests BOTTLES DRAWN AEROBIC AND ANAEROBIC 10CC  Final   Culture   Final    NO GROWTH 5 DAYS Performed at [facility]    Report Status 09/24/2014 FINAL  Final  Blood Culture (routine x 2)     Status: None   Collection Time: 09/17/14  4:55 PM  Result Value Ref Range Status   Specimen Description BLOOD LEFT HAND  Final   Special Requests BOTTLES DRAWN AEROBIC ONLY 5CC  Final   Culture   Final    NO GROWTH 5 DAYS Performed at [facility]    Report Status 09/24/2014 FINAL  Final  MRSA PCR Screening     Status: Abnormal   Collection Time: 09/17/14  8:10 PM  Result Value Ref Range Status   MRSA by PCR POSITIVE (A) NEGATIVE Final    Comment:        The GeneXpert MRSA Assay (FDA approved for NASAL specimens only), is one component of a comprehensive MRSA colonization surveillance program. It is not intended to diagnose MRSA infection nor to guide or monitor treatment for MRSA infections. RESULT CALLED TO, READ BACK BY AND VERIFIED WITH: PETTIFORD,A RN [phone] 09/18/14 MITCHELL,L   Culture, Urine     Status: None   Collection Time: 09/22/14  9:02 PM  Result Value Ref Range Status   Specimen Description URINE, CATHETERIZED  Final   Special Requests NONE  Final   Colony Count   Final    15,000 COLONIES/ML Performed at [facility]    Culture   Final    ENTEROCOCCUS SPECIES Performed at [facility]    Report Status 09/25/2014 FINAL  Final   Organism ID, Bacteria ENTEROCOCCUS SPECIES  Final      Susceptibility   Enterococcus species - MIC*    AMPICILLIN <=2 SENSITIVE Sensitive     LEVOFLOXACIN >=8 RESISTANT Resistant     NITROFURANTOIN <=16 SENSITIVE Sensitive     VANCOMYCIN 1 SENSITIVE Sensitive     TETRACYCLINE >=16 RESISTANT Resistant     * ENTEROCOCCUS SPECIES  Culture, blood  (routine x 2)     Status: None (Preliminary result)   Collection Time: 09/22/14 11:50 PM  Result Value Ref Range Status   Specimen Description BLOOD LEFT ARM  Final   Special Requests BOTTLES DRAWN AEROBIC AND ANAEROBIC 3CC  Final   Culture   Final           BLOOD CULTURE RECEIVED NO GROWTH TO DATE CULTURE WILL BE HELD FOR 5 DAYS BEFORE ISSUING A FINAL NEGATIVE REPORT Performed at [facility]    Report Status PENDING  Incomplete  Culture, blood (routine x 2)  Status: None (Preliminary result)   Collection Time: 09/22/14 11:56 PM  Result Value Ref Range Status   Specimen Description BLOOD LEFT HAND  Final   Special Requests BOTTLES DRAWN AEROBIC ONLY 2CC  Final   Culture   Final           BLOOD CULTURE RECEIVED NO GROWTH TO DATE CULTURE WILL BE HELD FOR 5 DAYS BEFORE ISSUING A FINAL NEGATIVE REPORT Performed at [facility]    Report Status PENDING  Incomplete    Studies/Results: No results found.     LOS: 10 days   Bobetta Korf 09/27/2014, 1:56 PM

## 2014-09-27 NOTE — Progress Notes (Signed)
PROGRESS NOTE  Samuel [name] GDJ:[id number] DOB: [birth year] DOA: 09/17/2014 PCP: [name], MD  Assessment/Plan: Hematuria/bilateral hydronephrosis possibly due to bladder wall thickening and tumor- currently hematuria clear -Patient on chronic anticoagulation presented with hematuria initially felt to be secondary to recent Foley catheter exchange, but likely due to bladder tumor -Renal ultrasound showed the presence of bilateral hydronephrosis, CT scan of the abdomen and pelvis showed moderate to severe left hydronephrosis with bladder wall thickening and possible tumor.  - 3/1--TURBT for bladder tumor and ureteral stent 3/1 at [facility] Dr. [name] -still having a little hematuria in foley bag -restart coumadin without bridging if okay with Dr. [name]  History of pulmonary embolism after surgery for left hip fracture in Feb 2015. 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 Awake and alert.  - workup so far negative, CT head negative, EEG did not show any epileptiform activity. - meropenem and narcotics were discontinued - neurology was consulted. Patient was initially placed on Depakote. Now patient is on Lamictal and workup. Depakote was discontinued.  Seizure episode - No further seizures. Now on Lamictal and Lyrica. Neurology has been following - EEG does not reveal any epileptiform activity. No other abnormalities noted.   Healthcare associated pneumonia/ left lower lobe infiltrates -All antibiotics discontinued following acute encephalopathy/seizure, ID following. Patient has received  appropriate course of meropenem for HCAP, has urinary colonization with drug-resistant Klebsiella and Acinetobacter.  -Blood cultures negative so far.   Klebsiella and Acinetobacter UTI - Patient has a history of chronic Foley catheter due to neurogenic bladder,has chronic colonization. - Meropenem has been discontinued. ID, Dr. [name] recommended pharmacy to give preoperative dose of tobramycin before his upcoming urological procedure.  Acute kidney injury with bilateral hydronephrosis. -Renal function is stable. Nephrology has been following. Potassium was low yesterday and was repeated. -creatinine remains elevated but stable  Hypokalemia, hypomagnesemia - This was being aggressively repleted.   Suicide ideation  During this hospitalization patient mentioned that he wanted to 'kill himself and get out of this mess', one-to-one sitter was placed.  -Psychiatry cleared pt for d/c  Essential hypertension Continue amlodipine.  Acute blood loss anemia due to hematuria Continue to monitor. Transfuse as needed.   Family Communication:   Pt at beside Disposition Plan:   SNF       Procedures/Studies: Ct Abdomen Pelvis Wo Contrast  09/25/2014   CLINICAL DATA:  Hydronephrosis  EXAM: CT ABDOMEN AND PELVIS WITHOUT CONTRAST  TECHNIQUE: Multidetector CT imaging of the abdomen and pelvis was performed following the standard protocol without IV contrast.  COMPARISON:  09/19/2014  FINDINGS: BODY WALL: Symmetric gynecomastia  LOWER CHEST: Decreasing airspace disease in the right lower lobe. Clustered centrilobular nodules in the right middle lobe are unchanged and could be chronic.  Small hiatal hernia.  ABDOMEN/PELVIS:  Liver: Hepatic steatosis.  Biliary: Distended gallbladder with calcified gallstones. There is no evidence of acute inflammation.  Pancreas: Unremarkable.  Spleen: Unremarkable.  Adrenals: Unremarkable.  Kidneys and ureters: Moderate bilateral hydroureteronephrosis, despite  decompressed bladder. Nonobstructive stones seen in the left lower pole, up to 5 mm.  Bladder: Irregular thickening of the decompressed bladder, suspicious  for tumor. A Foley catheter remains in good position.  Reproductive: Unremarkable.  Bowel: Descending colostomy with unchanged tissue in the low pelvis. Prominent volume of formed stool throughout the colon. No evidence of bowel obstruction. No inflammatory bowel wall thickening.  Retroperitoneum: Re- demonstrated right external iliac chain lymphadenopathy, including a 27 x 42 mm mass. No periaortic lymphadenopathy.  Peritoneum: No ascites or pneumoperitoneum.  Vascular: No acute abnormality.  OSSEOUS: Sacrificed spinal catheter fragments noted. There is extensive spinal fusion and sacral truncation. The patient is status post left hip disarticulation and right intertrochanteric femur fracture ORIF. 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  IMPRESSION: 1. 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 2. Right pelvic adenopathy compatible with metastatic disease. 3. Improving pneumonia in the right lower lobe. 4. Chronic findings are stable from prior and noted above.   Electronically Signed   By: [name] M.D.   On: 09/25/2014 05:05   Ct Abdomen Pelvis Wo Contrast  09/19/2014   CLINICAL DATA:  59 year old male with acute kidney injury, bilateral hydronephrosis on recent renal ultrasound, hematuria and elevated white count. 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  EXAM: CT ABDOMEN AND PELVIS WITHOUT CONTRAST  TECHNIQUE: Multidetector CT imaging of the abdomen and pelvis was performed following the standard protocol without IV contrast.  COMPARISON:  09/18/2014 ultrasound. 08/30/2014 abdominal-pelvic CT from [facility]. 07/05/2014 chest CT.  FINDINGS: Please note that parenchymal abnormalities may be missed without intravenous contrast.  Lower chest: Right basilar airspace disease and atelectasis have  increased since 08/30/2014. Increasing left basilar atelectasis is noted. There are tiny bilateral pleural effusions present.  Hepatobiliary: No hepatic abnormalities are identified. Cholelithiasis again noted without CT evidence of acute cholecystitis. There is no evidence of biliary dilatation.  Pancreas: Unremarkable  Spleen: Unremarkable  Adrenals/Urinary Tract: Moderate to severe bilateral hydronephrosis to the bladder again noted, stable on the right and increased on the left since 08/30/2014. A Foley catheter within a collapsed bladder is noted but again noted is abnormal increased density within the bladder versus bladder wall thickening, left-greater-than-right.  A nonobstructing 6 mm mid left renal calculus is noted.  The adrenal glands are unremarkable.  Stomach/Bowel: Prior PEG tube changes again identified. There is no evidence of bowel obstruction or pneumoperitoneum. Left colostomy changes again noted.  Vascular/Lymphatic: Enlarged iliac lymph nodes are unchanged with an index right external iliac lymph node again measuring 2.8 cm (image 64). No new enlarged lymph nodes are identified.  Reproductive: The prostate gland is enlarged.  Other: No evidence of free fluid.  Musculoskeletal: Chronic left hemipelvis bony deformities are again noted including absence of the proximal left femur, chronic right femoral head destruction, flattening and dysplasia of both acetabula, probable right hip effusion and lower sacral/ coccygeal deformity. Prior lumbar spine fusion, thoracolumbar posterior decompression and probable intraspinal pain catheter again noted.  IMPRESSION: Unchanged abnormal increased density within the bladder/bladder wall thickening suspicious for tumor. Moderate to severe bilateral hydronephrosis again noted, stable on the right and increased on the left since 08/30/2014, likely related to tumor obstruction.  Unchanged iliac lymphadenopathy, worrisome for metastatic disease.  Increased airspace  disease and atelectasis at the right lung base with aspiration or pneumonia considerations. Increased left basilar atelectasis and new tiny bilateral pleural effusions.  Chronic bony changes within the pelvis.  Cholelithiasis without CT evidence of acute cholecystitis.  Prostatomegaly.   Electronically Signed   By: Margarette Canada M.D.   On: 09/19/2014 11:25   Dg Chest 2 View  09/20/2014   CLINICAL DATA:  Pneumonia.  EXAM: CHEST  2 VIEW  COMPARISON:  09/17/2014  FINDINGS: Right PICC line is in place with the tip in the SVC. Very low lung volumes. Elevation of the right hemidiaphragm. Limited evaluation due to the low volumes. There is cardiomegaly. No acute bony abnormality.  IMPRESSION: Limited study due to very low lung volumes. Findings similar to prior study.  Right PICC line tip in the SVC.   Electronically Signed   By: Rolm Baptise M.D.   On: 09/20/2014 12:06   Ct Head Wo Contrast  09/23/2014   CLINICAL DATA:  Took Ativan to sleep last night. Not responding this morning. Altered mental status.  EXAM: CT HEAD WITHOUT CONTRAST  TECHNIQUE: Contiguous axial images were obtained from the base of the skull through the vertex without intravenous contrast.  COMPARISON:  12/07/2013  FINDINGS: Patient was uncooperative for the study which had to be repeated several times due to motion. No visible acute intracranial abnormality. No visible acute infarction. No hemorrhage or hydrocephalus. No extra-axial fluid collection. Paranasal sinuses and mastoids are clear. No acute calvarial abnormality.  IMPRESSION: Typical, somewhat limited study due to patient motion. Study was repeated several times. No visible acute intracranial abnormality. With the multiple attempts, the study is felt to be diagnostic.   Electronically Signed   By: Rolm Baptise M.D.   On: 09/23/2014 11:12   US Renal  09/18/2014   CLINICAL DATA:  Acute renal failure  EXAM: RENAL/URINARY TRACT ULTRASOUND COMPLETE  COMPARISON:  CT, 08/30/2014  FINDINGS:  Right Kidney:  Length: 13.2 cm. Normal parenchymal echogenicity. No mass or stone. Moderate hydronephrosis.  Left Kidney:  Length: 13.2 cm. Normal parenchymal echogenicity. No mass or stone. Mild to moderate hydronephrosis.  Bladder:  Decompressed by a Foley catheter.  IMPRESSION: 1. Bilateral hydronephrosis, moderate on the right and mild to moderate on the left. Hydronephrosis on the left appears increased when compared to the prior CT. That on the right is similar.   Electronically Signed   By: Lajean Manes M.D.   On: 09/18/2014 17:14   Dg Chest Port 1 View  09/22/2014   CLINICAL DATA:  Respiratory distress.  EXAM: PORTABLE CHEST - 1 VIEW  COMPARISON:  09/20/2014  FINDINGS: Right PICC line remains in place, unchanged. Very low lung volumes with cardiomegaly and bibasilar atelectasis. No overt edema. No visible effusions. No acute bony abnormality.  IMPRESSION: Very low lung volumes with cardiomegaly and bibasilar atelectasis.   Electronically Signed   By: Rolm Baptise M.D.   On: 09/22/2014 20:49   Dg Chest Port 1 View  09/17/2014   CLINICAL DATA:  Initial evaluation for shortness of breath  EXAM: PORTABLE CHEST - 1 VIEW  COMPARISON:  06/28/2014  FINDINGS: Very limited inspiratory effect. Right lung clear except for mild atelectasis at the base. The retrocardiac left lower lobe is difficult to evaluate. The left upper lobe is clear.  IMPRESSION: Limited study due to very limited inspiratory effect. Retrocardiac left lower lobe difficult to evaluate but left lower lobe infiltrate suspected.   Electronically Signed   By: Skipper Cliche M.D.   On: 09/17/2014 16:30         Subjective: Patient denies fevers, chills, headache, chest pain, dyspnea, nausea, vomiting, diarrhea, abdominal pain, dysuria, hematuria   Objective: Filed Vitals:   09/27/14 0649 09/27/14 0800 09/27/14 1200 09/27/14 1600  BP: 115/64 129/88    Pulse:  36    Temp:  98.7 F (37.1 C)  98.5 F (36.9 C) 98.7 F (37.1 C)    TempSrc:  Oral Oral Oral  Resp:  19    Height:      Weight:      SpO2:  93%      Intake/Output Summary (Last 24 hours) at 09/27/14 1849 Last data filed at 09/27/14 1400  Gross per 24 hour  Intake 4704.58 ml  Output   5000 ml  Net -295.42 ml   Weight change: 0.9 kg (1 lb 15.8 oz) Exam:   General:  Pt is alert, follows commands appropriately, not in acute distress  HEENT: No icterus, No thrush, [place]/AT  Cardiovascular: RRR, S1/S2, no rubs, no gallops  Respiratory: CTA bilaterally, no wheezing, no crackles, no rhonchi  Abdomen: Soft/+BS, non tender, non distended, no guarding  Extremities: trace RLE edema, No lymphangitis, No petechiae, No rashes, no synovitis  Data Reviewed: Basic Metabolic Panel:  Recent Labs Lab 09/25/14 0355 09/25/14 1830 09/25/14 2359 09/26/14 0735 09/27/14 0345  NA 131* 136 133* 137 135  K 2.0* 2.6* 2.9* 2.9* 3.3*  CL 99 102 102 104 104  CO2 26 25 24 24 26   GLUCOSE 111* 147* 117* 123* 108*  BUN 18 21 20 20 17   CREATININE 2.04* 1.99* 2.24* 2.27* 2.23*  CALCIUM 7.2* 7.6* 7.3* 7.8* 7.7*  MG 1.3* 2.5  --   --  2.0   Liver Function Tests:  Recent Labs Lab 09/23/14 1307 09/25/14 2359  AST 20 17  ALT 12 12  ALKPHOS 59 49  BILITOT 1.1 0.6  PROT 7.0 5.8*  ALBUMIN 3.3* 3.0*   No results for input(s): LIPASE, AMYLASE in the last 168 hours.  Recent Labs Lab 09/23/14 1307  AMMONIA 22   CBC:  Recent Labs Lab 09/23/14 0332 09/24/14 0549 09/25/14 0355 09/25/14 2359 09/27/14 0345  WBC 10.0 13.6* 11.4* 11.3* 12.1*  HGB 8.9* 8.6* 9.0* 8.3* 8.0*  HCT 27.2* 25.7* 26.7* 24.8* 24.8*  MCV 80.5 79.6 79.7 81.0 84.1  PLT 169 171 187 199 211   Cardiac Enzymes: No results for input(s): CKTOTAL, CKMB, CKMBINDEX, TROPONINI in the last 168 hours. BNP: Invalid input(s): POCBNP CBG:  Recent Labs Lab 09/22/14 2014 09/23/14 1253 09/27/14 0803  GLUCAP 91 101* 106*    Recent Results (from the past 240 hour(s))  MRSA PCR Screening      Status: Abnormal   Collection Time: 09/17/14  8:10 PM  Result Value Ref Range Status   MRSA by PCR POSITIVE (A) NEGATIVE Final    Comment:        The GeneXpert MRSA Assay (FDA approved for NASAL specimens only), is one component of a comprehensive MRSA colonization surveillance program. It is not intended to diagnose MRSA infection nor to guide or monitor treatment for MRSA infections. RESULT CALLED TO, READ BACK BY AND VERIFIED WITH: PETTIFORD,A RN [phone] 09/18/14 MITCHELL,L   Culture, Urine     Status: None   Collection Time: 09/22/14  9:02 PM  Result Value Ref Range Status   Specimen Description URINE, CATHETERIZED  Final   Special Requests NONE  Final   Colony Count   Final    15,000 COLONIES/ML Performed at [facility]    Culture   Final    ENTEROCOCCUS SPECIES Performed at [facility]    Report Status 09/25/2014 FINAL  Final   Organism ID, Bacteria ENTEROCOCCUS SPECIES  Final      Susceptibility   Enterococcus species - MIC*    AMPICILLIN <=2 SENSITIVE Sensitive  LEVOFLOXACIN >=8 RESISTANT Resistant     NITROFURANTOIN <=16 SENSITIVE Sensitive     VANCOMYCIN 1 SENSITIVE Sensitive     TETRACYCLINE >=16 RESISTANT Resistant     * ENTEROCOCCUS SPECIES  Culture, blood (routine x 2)     Status: None (Preliminary result)   Collection Time: 09/22/14 11:50 PM  Result Value Ref Range Status   Specimen Description BLOOD LEFT ARM  Final   Special Requests BOTTLES DRAWN AEROBIC AND ANAEROBIC 3CC  Final   Culture   Final           BLOOD CULTURE RECEIVED NO GROWTH TO DATE CULTURE WILL BE HELD FOR 5 DAYS BEFORE ISSUING A FINAL NEGATIVE REPORT Performed at [facility]    Report Status PENDING  Incomplete  Culture, blood (routine x 2)     Status: None (Preliminary result)   Collection Time: 09/22/14 11:56 PM  Result Value Ref Range Status   Specimen Description BLOOD LEFT HAND  Final   Special Requests BOTTLES DRAWN AEROBIC ONLY 2CC  Final    Culture   Final           BLOOD CULTURE RECEIVED NO GROWTH TO DATE CULTURE WILL BE HELD FOR 5 DAYS BEFORE ISSUING A FINAL NEGATIVE REPORT Performed at [facility]    Report Status PENDING  Incomplete     Scheduled Meds: . antiseptic oral rinse  7 mL Mouth Rinse BID  . atorvastatin  10 mg Oral QHS  . feeding supplement (ENSURE COMPLETE)  237 mL Oral BID BM  . lamoTRIgine  200 mg Oral BID  . levothyroxine  62.5 mcg Intravenous QAC breakfast  . metoprolol  2.5 mg Intravenous 4 times per day  . oxybutynin  5 mg Oral Daily  . pantoprazole (PROTONIX) IV  40 mg Intravenous Q12H  . predniSONE  10 mg Oral Q breakfast  . pregabalin  75 mg Oral TID  . QUEtiapine  100 mg Oral BID  . saccharomyces boulardii  250 mg Oral BID  . sodium chloride  3 mL Intravenous Q12H   Continuous Infusions: . dextrose 5 % with KCl 20 mEq / L 20 mEq (09/27/14 1601)     Earon Rivest, DO  [facility] Pager [phone]  If 7PM-7AM, please contact night-coverage [email or web address] Password [facility] 09/27/2014, 6:49 PM   LOS: 10 days

## 2014-09-27 NOTE — Progress Notes (Signed)
Received pt from stepdown. Agree with previous RN's assessment. Will continue to monitor.

## 2014-09-28 ENCOUNTER — Inpatient Hospital Stay (HOSPITAL_COMMUNITY): Payer: Medicare Other

## 2014-09-28 LAB — BASIC METABOLIC PANEL
ANION GAP: 7 (ref 5–15)
BUN: 18 mg/dL (ref 6–23)
CO2: 25 mmol/L (ref 19–32)
Calcium: 7.6 mg/dL — ABNORMAL LOW (ref 8.4–10.5)
Chloride: 100 mmol/L (ref 96–112)
Creatinine, Ser: 2.23 mg/dL — ABNORMAL HIGH (ref 0.50–1.35)
GFR calc Af Amer: 36 mL/min — ABNORMAL LOW (ref >90)
GFR, EST NON AFRICAN AMERICAN: 31 mL/min — AB (ref >90)
Glucose, Bld: 111 mg/dL — ABNORMAL HIGH (ref 70–99)
Potassium: 3.6 mmol/L (ref 3.5–5.1)
Sodium: 132 mmol/L — ABNORMAL LOW (ref 135–145)

## 2014-09-28 LAB — CBC
HCT: 23.3 % — ABNORMAL LOW (ref 39.0–52.0)
Hemoglobin: 7.6 g/dL — ABNORMAL LOW (ref 13.0–17.0)
MCH: 27.4 pg (ref 26.0–34.0)
MCHC: 32.6 g/dL (ref 30.0–36.0)
MCV: 84.1 fL (ref 78.0–100.0)
Platelets: 214 10*3/uL (ref 150–400)
RBC: 2.77 MIL/uL — AB (ref 4.22–5.81)
RDW: 15.5 % (ref 11.5–15.5)
WBC: 13.2 10*3/uL — ABNORMAL HIGH (ref 4.0–10.5)

## 2014-09-28 LAB — MAGNESIUM: Magnesium: 1.5 mg/dL (ref 1.5–2.5)

## 2014-09-28 LAB — PROTIME-INR
INR: 1.14 (ref 0.00–1.49)
Prothrombin Time: 14.7 seconds (ref 11.6–15.2)

## 2014-09-28 MED ORDER — WARFARIN - PHARMACIST DOSING INPATIENT: Freq: Every day | Status: DC

## 2014-09-28 MED ORDER — SENNA 8.6 MG PO TABS: 2.0000 | ORAL_TABLET | Freq: Every day | ORAL | Status: DC

## 2014-09-28 MED ORDER — DOCUSATE SODIUM 100 MG PO CAPS
100.0000 mg | ORAL_CAPSULE | Freq: Two times a day (BID) | ORAL | Status: DC
  Administered 2014-09-28 – 2014-10-01 (×6): 100 mg via ORAL
  Filled 2014-09-28 (×8): qty 1

## 2014-09-28 MED ORDER — MAGNESIUM SULFATE 2 GM/50ML IV SOLN
2.0000 g | Freq: Once | INTRAVENOUS | Status: AC
  Administered 2014-09-28: 2 g via INTRAVENOUS
  Filled 2014-09-28: qty 50

## 2014-09-28 MED ORDER — METOPROLOL TARTRATE 25 MG PO TABS: 25.0000 mg | ORAL_TABLET | Freq: Two times a day (BID) | ORAL | Status: DC

## 2014-09-28 MED ORDER — WARFARIN SODIUM 4 MG PO TABS
4.0000 mg | ORAL_TABLET | Freq: Once | ORAL | Status: AC
  Administered 2014-09-28: 4 mg via ORAL
  Filled 2014-09-28: qty 1

## 2014-09-28 MED ORDER — LEVOTHYROXINE SODIUM 25 MCG PO TABS
125.0000 ug | ORAL_TABLET | Freq: Every day | ORAL | Status: DC
  Administered 2014-09-29 – 2014-10-01 (×3): 125 ug via ORAL
  Filled 2014-09-28 (×6): qty 1

## 2014-09-28 MED ORDER — DOCUSATE SODIUM 100 MG PO CAPS: 100.0000 mg | ORAL_CAPSULE | Freq: Two times a day (BID) | ORAL | Status: DC

## 2014-09-28 MED ORDER — METOPROLOL TARTRATE 25 MG PO TABS
25.0000 mg | ORAL_TABLET | Freq: Two times a day (BID) | ORAL | Status: DC
Start: 2014-09-28 — End: 2014-10-01
  Administered 2014-09-28 – 2014-10-01 (×5): 25 mg via ORAL
  Filled 2014-09-28 (×6): qty 1

## 2014-09-28 MED ORDER — ENSURE COMPLETE PO LIQD: 237.0000 mL | Freq: Two times a day (BID) | ORAL | Status: DC

## 2014-09-28 MED ORDER — SENNA 8.6 MG PO TABS
2.0000 | ORAL_TABLET | Freq: Every day | ORAL | Status: DC
  Administered 2014-09-28 – 2014-10-01 (×3): 17.2 mg via ORAL
  Filled 2014-09-28 (×5): qty 2

## 2014-09-28 NOTE — Plan of Care (Signed)
Problem: Phase I Progression Outcomes Goal: Voiding-avoid urinary catheter unless indicated Outcome: Not Applicable Date Met:  79/44/46 Pt has chronic indwelling catheter due to neurogenic bladder.
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
## 2014-09-28 NOTE — Progress Notes (Signed)
PROGRESS NOTE  Samuel [name] GLO:[id number] DOB: 01/04/1956 DOA: 09/17/2014 PCP: [name], MD  Assessment/Plan: Hematuria/bilateral hydronephrosis due to bladder wall thickening and tumor- -Patient on chronic anticoagulation presented with hematuria initially felt to be secondary to recent Foley catheter exchange, but likely due to bladder tumor -Renal ultrasound showed the presence of bilateral hydronephrosis, CT scan of the abdomen and pelvis showed moderate to severe left hydronephrosis with bladder wall thickening and possible tumor.  - 3/1--TURBT for bladder tumor and ureteral stent 3/1 at [facility] Dr. [name] -still having a little hematuria in foley bag -restart coumadin without bridging -Plan to discharge back to SNF 3/4 if stable after starting warfarin  History of pulmonary embolism after surgery for left hip fracture in Feb 2015. 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 Awake and alert.  - workup so far negative, CT head negative, EEG did not show any epileptiform activity. - meropenem and narcotics were discontinued - neurology was consulted. Patient was initially placed on Depakote. Now patient is on Lamictal. Depakote was discontinued.  Seizure episode - No further seizures. Now on Lamictal and Lyrica. Neurology has been following - EEG does not reveal any epileptiform activity. No other abnormalities noted.   Healthcare associated pneumonia/ left lower lobe infiltrates -All antibiotics discontinued following acute encephalopathy/seizure, ID following. Patient has received  appropriate course of meropenem for HCAP, has urinary colonization with drug-resistant Klebsiella and Acinetobacter.  -Blood cultures negative so far.   Klebsiella and Acinetobacter UTI - Patient has a history of chronic Foley catheter due to neurogenic bladder,has chronic colonization. - Meropenem has been discontinued. ID, Dr. [name] recommended pharmacy to give preoperative dose of tobramycin before his upcoming urological procedure.  Acute kidney injury with bilateral hydronephrosis. -Renal function is stable. Nephrology had been following.  -Potassium was repeated. -creatinine remains elevated but stable  Hypokalemia, hypomagnesemia - This was being aggressively repleted.   Suicide ideation  During this hospitalization patient mentioned that he wanted to 'kill himself and get out of this mess', one-to-one sitter was placed.  -Psychiatry cleared pt for d/c  Essential hypertension Continue metoprolol tartrate  Acute blood loss anemia due to hematuria Continue to monitor. Transfuse as needed. -also a dilutional componene  Family Communication: Pt at beside Disposition Plan: SNF 09/29/14 if stable         Procedures/Studies: Ct Abdomen Pelvis Wo Contrast  09/25/2014   CLINICAL DATA:  Hydronephrosis  EXAM: CT ABDOMEN AND PELVIS WITHOUT CONTRAST  TECHNIQUE: Multidetector CT imaging of the abdomen and pelvis was performed following the standard protocol without IV contrast.  COMPARISON:  09/19/2014  FINDINGS: BODY WALL: Symmetric gynecomastia  LOWER CHEST: Decreasing airspace disease in the right lower lobe. Clustered centrilobular nodules in the right middle lobe are unchanged and could be chronic.  Small hiatal hernia.  ABDOMEN/PELVIS:  Liver: Hepatic steatosis.  Biliary: Distended gallbladder with calcified gallstones. There is no evidence of acute inflammation.  Pancreas: Unremarkable.  Spleen: Unremarkable.  Adrenals: Unremarkable.  Kidneys and ureters: Moderate  bilateral hydroureteronephrosis, despite decompressed bladder. Nonobstructive stones seen in the left lower pole, up to 5 mm.  Bladder: Irregular thickening of the  decompressed bladder, suspicious for tumor. A Foley catheter remains in good position.  Reproductive: Unremarkable.  Bowel: Descending colostomy with unchanged tissue in the low pelvis. Prominent volume of formed stool throughout the colon. No evidence of bowel obstruction. No inflammatory bowel wall thickening.  Retroperitoneum: Re- demonstrated right external iliac chain lymphadenopathy, including a 27 x 42 mm mass. No periaortic lymphadenopathy.  Peritoneum: No ascites or pneumoperitoneum.  Vascular: No acute abnormality.  OSSEOUS: Sacrificed spinal catheter fragments noted. There is extensive spinal fusion and sacral truncation. The patient is status post left hip disarticulation and right intertrochanteric femur fracture ORIF. 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  IMPRESSION: 1. 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 2. Right pelvic adenopathy compatible with metastatic disease. 3. Improving pneumonia in the right lower lobe. 4. Chronic findings are stable from prior and noted above.   Electronically Signed   By: [name] M.D.   On: 09/25/2014 05:05   Ct Abdomen Pelvis Wo Contrast  09/19/2014   CLINICAL DATA:  59 year old male with acute kidney injury, bilateral hydronephrosis on recent renal ultrasound, hematuria and elevated white count. 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  EXAM: CT ABDOMEN AND PELVIS WITHOUT CONTRAST  TECHNIQUE: Multidetector CT imaging of the abdomen and pelvis was performed following the standard protocol without IV contrast.  COMPARISON:  09/18/2014 ultrasound. 08/30/2014 abdominal-pelvic CT from [facility]. 07/05/2014 chest CT.  FINDINGS: Please note that parenchymal abnormalities may be missed without intravenous contrast.  Lower chest: Right  basilar airspace disease and atelectasis have increased since 08/30/2014. Increasing left basilar atelectasis is noted. There are tiny bilateral pleural effusions present.  Hepatobiliary: No hepatic abnormalities are identified. Cholelithiasis again noted without CT evidence of acute cholecystitis. There is no evidence of biliary dilatation.  Pancreas: Unremarkable  Spleen: Unremarkable  Adrenals/Urinary Tract: Moderate to severe bilateral hydronephrosis to the bladder again noted, stable on the right and increased on the left since 08/30/2014. A Foley catheter within a collapsed bladder is noted but again noted is abnormal increased density within the bladder versus bladder wall thickening, left-greater-than-right.  A nonobstructing 6 mm mid left renal calculus is noted.  The adrenal glands are unremarkable.  Stomach/Bowel: Prior PEG tube changes again identified. There is no evidence of bowel obstruction or pneumoperitoneum. Left colostomy changes again noted.  Vascular/Lymphatic: Enlarged iliac lymph nodes are unchanged with an index right external iliac lymph node again measuring 2.8 cm (image 64). No new enlarged lymph nodes are identified.  Reproductive: The prostate gland is enlarged.  Other: No evidence of free fluid.  Musculoskeletal: Chronic left hemipelvis bony deformities are again noted including absence of the proximal left femur, chronic right femoral head destruction, flattening and dysplasia of both acetabula, probable right hip effusion and lower sacral/ coccygeal deformity. Prior lumbar spine fusion, thoracolumbar posterior decompression and probable intraspinal pain catheter again noted.  IMPRESSION: Unchanged abnormal increased density within the bladder/bladder wall thickening suspicious for tumor. Moderate to severe bilateral hydronephrosis again noted, stable on the right and increased on the left since 08/30/2014, likely related to tumor obstruction.  Unchanged iliac lymphadenopathy,  worrisome for metastatic disease.  Increased airspace disease and atelectasis at the right lung base with aspiration or pneumonia considerations. Increased left basilar atelectasis and new tiny bilateral pleural effusions.  Chronic bony changes within the pelvis.  Cholelithiasis without CT evidence of acute cholecystitis.  Prostatomegaly.   Electronically Signed   By: Margarette Canada M.D.   On: 09/19/2014 11:25   Dg Chest  2 View  09/20/2014   CLINICAL DATA:  Pneumonia.  EXAM: CHEST  2 VIEW  COMPARISON:  09/17/2014  FINDINGS: Right PICC line is in place with the tip in the SVC. Very low lung volumes. Elevation of the right hemidiaphragm. Limited evaluation due to the low volumes. There is cardiomegaly. No acute bony abnormality.  IMPRESSION: Limited study due to very low lung volumes. Findings similar to prior study.  Right PICC line tip in the SVC.   Electronically Signed   By: Rolm Baptise M.D.   On: 09/20/2014 12:06   Ct Head Wo Contrast  09/23/2014   CLINICAL DATA:  Took Ativan to sleep last night. Not responding this morning. Altered mental status.  EXAM: CT HEAD WITHOUT CONTRAST  TECHNIQUE: Contiguous axial images were obtained from the base of the skull through the vertex without intravenous contrast.  COMPARISON:  12/07/2013  FINDINGS: Patient was uncooperative for the study which had to be repeated several times due to motion. No visible acute intracranial abnormality. No visible acute infarction. No hemorrhage or hydrocephalus. No extra-axial fluid collection. Paranasal sinuses and mastoids are clear. No acute calvarial abnormality.  IMPRESSION: Typical, somewhat limited study due to patient motion. Study was repeated several times. No visible acute intracranial abnormality. With the multiple attempts, the study is felt to be diagnostic.   Electronically Signed   By: Rolm Baptise M.D.   On: 09/23/2014 11:12   US Renal  09/18/2014   CLINICAL DATA:  Acute renal failure  EXAM: RENAL/URINARY TRACT  ULTRASOUND COMPLETE  COMPARISON:  CT, 08/30/2014  FINDINGS: Right Kidney:  Length: 13.2 cm. Normal parenchymal echogenicity. No mass or stone. Moderate hydronephrosis.  Left Kidney:  Length: 13.2 cm. Normal parenchymal echogenicity. No mass or stone. Mild to moderate hydronephrosis.  Bladder:  Decompressed by a Foley catheter.  IMPRESSION: 1. Bilateral hydronephrosis, moderate on the right and mild to moderate on the left. Hydronephrosis on the left appears increased when compared to the prior CT. That on the right is similar.   Electronically Signed   By: Lajean Manes M.D.   On: 09/18/2014 17:14   Dg Chest Port 1 View  09/28/2014   CLINICAL DATA:  PICC placement  EXAM: PORTABLE CHEST - 1 VIEW  COMPARISON:  09/22/2014  FINDINGS: Right PICC tip projects in the lower superior vena cava.  Lung volumes are low as that were on the prior study. There is persistent lung base atelectasis. No convincing pneumonia. No pulmonary edema. No pneumothorax.  IMPRESSION: Right PICC line tip projects in the lower superior vena cava. No convincing pneumonia. No pulmonary edema. Persistent basilar atelectasis.   Electronically Signed   By: Lajean Manes M.D.   On: 09/28/2014 13:37   Dg Chest Port 1 View  09/22/2014   CLINICAL DATA:  Respiratory distress.  EXAM: PORTABLE CHEST - 1 VIEW  COMPARISON:  09/20/2014  FINDINGS: Right PICC line remains in place, unchanged. Very low lung volumes with cardiomegaly and bibasilar atelectasis. No overt edema. No visible effusions. No acute bony abnormality.  IMPRESSION: Very low lung volumes with cardiomegaly and bibasilar atelectasis.   Electronically Signed   By: Rolm Baptise M.D.   On: 09/22/2014 20:49   Dg Chest Port 1 View  09/17/2014   CLINICAL DATA:  Initial evaluation for shortness of breath  EXAM: PORTABLE CHEST - 1 VIEW  COMPARISON:  06/28/2014  FINDINGS: Very limited inspiratory effect. Right lung clear except for mild atelectasis at the base. The retrocardiac left lower lobe is  difficult to evaluate. The left upper lobe is clear.  IMPRESSION: Limited study due to very limited inspiratory effect. Retrocardiac left lower lobe difficult to evaluate but left lower lobe infiltrate suspected.   Electronically Signed   By: Skipper Cliche M.D.   On: 09/17/2014 16:30         Subjective: Patient denies fevers, chills, headache, chest pain, dyspnea, nausea, vomiting, diarrhea, abdominal pain, dysuria, hematuria   Objective: Filed Vitals:   09/28/14 0012 09/28/14 0603 09/28/14 0746 09/28/14 1432  BP: 136/97 113/65  119/56  Pulse:  89  92  Temp:  98.6 F (37 C)  98.9 F (37.2 C)  TempSrc:  Oral  Oral  Resp:  20  20  Height:      Weight:   115.7 kg (255 lb 1.2 oz)   SpO2:  92%  93%    Intake/Output Summary (Last 24 hours) at 09/28/14 1714 Last data filed at 09/28/14 1433  Gross per 24 hour  Intake   3450 ml  Output   2725 ml  Net    725 ml   Weight change:  Exam:   General:  Pt is alert, follows commands appropriately, not in acute distress  HEENT: No icterus, No thrush,  [place]/AT  Cardiovascular: RRR, S1/S2, no rubs, no gallops  Respiratory: Bibasilar crackles. No wheezing. Good air movement  Abdomen: Soft/+BS, non tender, non distended, no guarding  Extremities: 1+RLE edema, No lymphangitis, No petechiae, No rashes, no synovitis  Data Reviewed: Basic Metabolic Panel:  Recent Labs Lab 09/25/14 0355 09/25/14 1830 09/25/14 2359 09/26/14 0735 09/27/14 0345 09/28/14 0536  NA 131* 136 133* 137 135 132*  K 2.0* 2.6* 2.9* 2.9* 3.3* 3.6  CL 99 102 102 104 104 100  CO2 26 25 24 24 26 25   GLUCOSE 111* 147* 117* 123* 108* 111*  BUN 18 21 20 20 17 18   CREATININE 2.04* 1.99* 2.24* 2.27* 2.23* 2.23*  CALCIUM 7.2* 7.6* 7.3* 7.8* 7.7* 7.6*  MG 1.3* 2.5  --   --  2.0 1.5   Liver Function Tests:  Recent Labs Lab 09/23/14 1307 09/25/14 2359  AST 20 17  ALT 12 12  ALKPHOS 59 49  BILITOT 1.1 0.6  PROT 7.0 5.8*  ALBUMIN 3.3* 3.0*   No results  for input(s): LIPASE, AMYLASE in the last 168 hours.  Recent Labs Lab 09/23/14 1307  AMMONIA 22   CBC:  Recent Labs Lab 09/24/14 0549 09/25/14 0355 09/25/14 2359 09/27/14 0345 09/28/14 0536  WBC 13.6* 11.4* 11.3* 12.1* 13.2*  HGB 8.6* 9.0* 8.3* 8.0* 7.6*  HCT 25.7* 26.7* 24.8* 24.8* 23.3*  MCV 79.6 79.7 81.0 84.1 84.1  PLT 171 187 199 211 214   Cardiac Enzymes: No results for input(s): CKTOTAL, CKMB, CKMBINDEX, TROPONINI in the last 168 hours. BNP: Invalid input(s): POCBNP CBG:  Recent Labs Lab 09/22/14 2014 09/23/14 1253 09/27/14 0803  GLUCAP 91 101* 106*    Recent Results (from the past 240 hour(s))  Culture, Urine     Status: None   Collection Time: 09/22/14  9:02 PM  Result Value Ref Range Status   Specimen Description URINE, CATHETERIZED  Final   Special Requests NONE  Final   Colony Count   Final    15,000 COLONIES/ML Performed at [facility]    Culture   Final    ENTEROCOCCUS SPECIES Performed at [facility]    Report Status 09/25/2014 FINAL  Final   Organism ID, Bacteria ENTEROCOCCUS SPECIES  Final  Susceptibility   Enterococcus species - MIC*    AMPICILLIN <=2 SENSITIVE Sensitive     LEVOFLOXACIN >=8 RESISTANT Resistant     NITROFURANTOIN <=16 SENSITIVE Sensitive     VANCOMYCIN 1 SENSITIVE Sensitive     TETRACYCLINE >=16 RESISTANT Resistant     * ENTEROCOCCUS SPECIES  Culture, blood (routine x 2)     Status: None (Preliminary result)   Collection Time: 09/22/14 11:50 PM  Result Value Ref Range Status   Specimen Description BLOOD LEFT ARM  Final   Special Requests BOTTLES DRAWN AEROBIC AND ANAEROBIC 3CC  Final   Culture   Final           BLOOD CULTURE RECEIVED NO GROWTH TO DATE CULTURE WILL BE HELD FOR 5 DAYS BEFORE ISSUING A FINAL NEGATIVE REPORT Performed at [facility]    Report Status PENDING  Incomplete  Culture, blood (routine x 2)     Status: None (Preliminary result)   Collection Time: 09/22/14 11:56  PM  Result Value Ref Range Status   Specimen Description BLOOD LEFT HAND  Final   Special Requests BOTTLES DRAWN AEROBIC ONLY 2CC  Final   Culture   Final           BLOOD CULTURE RECEIVED NO GROWTH TO DATE CULTURE WILL BE HELD FOR 5 DAYS BEFORE ISSUING A FINAL NEGATIVE REPORT Performed at [facility]    Report Status PENDING  Incomplete     Scheduled Meds: . antiseptic oral rinse  7 mL Mouth Rinse BID  . atorvastatin  10 mg Oral QHS  . docusate sodium  100 mg Oral BID  . feeding supplement (ENSURE COMPLETE)  237 mL Oral BID BM  . lamoTRIgine  200 mg Oral BID  . [START ON 09/29/2014] levothyroxine  125 mcg Oral QAC breakfast  . metoprolol tartrate  25 mg Oral BID  . oxybutynin  5 mg Oral Daily  . pantoprazole (PROTONIX) IV  40 mg Intravenous Q12H  . predniSONE  10 mg Oral Q breakfast  . pregabalin  75 mg Oral TID  . QUEtiapine  100 mg Oral BID  . saccharomyces boulardii  250 mg Oral BID  . senna  2 tablet Oral Daily  . sodium chloride  3 mL Intravenous Q12H  . warfarin  4 mg Oral ONCE-1800  . Warfarin - Pharmacist Dosing Inpatient   Does not apply q1800   Continuous Infusions:    Dayten Juba, DO  [facility] Pager [phone]  If 7PM-7AM, please contact night-coverage [email or web address] Password [email or web address] 09/28/2014, 5:14 PM   LOS: 11 days

## 2014-09-28 NOTE — Progress Notes (Signed)
  Patient without complaints.  Urine clear.  1) renal failure, Bilateral hydronephrosis-status post bilateral ureteral stents.  2) muscle invasive high-grade bladder cancer-I spoke with Dr. Tresa Moore in pathology and Dr. Alen Blew in oncology. 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 Given CT findings (bilateral hydronephrosis, + lymphadenopathy) pt may have T3 disease and/or N+ disease (which would be Stage IV) although lymph nodes can sometimes be reactive.   OK to restart coumadin now and get patient back to his home when stable.   Patient high risk for bleeding and may need palliative TURBT or radiation if gross hematuria is an issue.    I'll present the patient at multidisciplinary GU tumor board next week to make a long term plan.   See me in office in 2-3 weeks.

## 2014-09-28 NOTE — Progress Notes (Signed)
CSW met with Pt along with psych MD. Pt reports improved mood and denies any depressive or anxious symptoms. Pt reported that his only stressor currently was waiting on the results of a biopsy. Pt reported that his daughter had come to visit him the previous day.   MD continues to monitor medications and make recommendations as needed. CSW will continue to follow Pt to assist with any discharge planning needs.  Peri Maris, [place] 09/28/2014 3:38 PM

## 2014-09-28 NOTE — Progress Notes (Signed)
Pt was lying in bed and awake when I arrived. He said he just wanted company. I listened as he talked about family and church. 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 He appreciated the visit and listening ear. I provided pastoral care and encouraged him to talk more about his interests and family. Ernest Haber Chaplain   09/28/14 1600  Clinical Encounter Type  Visited With Patient
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
## 2014-09-29 ENCOUNTER — Inpatient Hospital Stay (HOSPITAL_COMMUNITY): Payer: Medicare Other

## 2014-09-29 DIAGNOSIS — N179 Acute kidney failure, unspecified: Secondary | ICD-10-CM | POA: Insufficient documentation

## 2014-09-29 LAB — CBC
HEMATOCRIT: 24.3 % — AB (ref 39.0–52.0)
Hemoglobin: 7.8 g/dL — ABNORMAL LOW (ref 13.0–17.0)
MCH: 27.2 pg (ref 26.0–34.0)
MCHC: 32.1 g/dL (ref 30.0–36.0)
MCV: 84.7 fL (ref 78.0–100.0)
Platelets: 307 10*3/uL (ref 150–400)
RBC: 2.87 MIL/uL — AB (ref 4.22–5.81)
RDW: 15.7 % — ABNORMAL HIGH (ref 11.5–15.5)
WBC: 14.8 10*3/uL — ABNORMAL HIGH (ref 4.0–10.5)

## 2014-09-29 LAB — URINALYSIS, ROUTINE W REFLEX MICROSCOPIC
BILIRUBIN URINE: NEGATIVE
Glucose, UA: NEGATIVE mg/dL
Ketones, ur: NEGATIVE mg/dL
Nitrite: NEGATIVE
Protein, ur: 100 mg/dL — AB
Specific Gravity, Urine: 1.008 (ref 1.005–1.030)
Urobilinogen, UA: 0.2 mg/dL (ref 0.0–1.0)
pH: 7 (ref 5.0–8.0)

## 2014-09-29 LAB — BASIC METABOLIC PANEL
ANION GAP: 9 (ref 5–15)
BUN: 19 mg/dL (ref 6–23)
CALCIUM: 8.2 mg/dL — AB (ref 8.4–10.5)
CO2: 27 mmol/L (ref 19–32)
Chloride: 100 mmol/L (ref 96–112)
Creatinine, Ser: 1.98 mg/dL — ABNORMAL HIGH (ref 0.50–1.35)
GFR, EST AFRICAN AMERICAN: 41 mL/min — AB (ref >90)
GFR, EST NON AFRICAN AMERICAN: 36 mL/min — AB (ref >90)
Glucose, Bld: 105 mg/dL — ABNORMAL HIGH (ref 70–99)
Potassium: 3.8 mmol/L (ref 3.5–5.1)
SODIUM: 136 mmol/L (ref 135–145)

## 2014-09-29 LAB — CULTURE, BLOOD (ROUTINE X 2)
CULTURE: NO GROWTH
CULTURE: NO GROWTH

## 2014-09-29 LAB — URINE MICROSCOPIC-ADD ON

## 2014-09-29 LAB — PROTIME-INR
INR: 1.11 (ref 0.00–1.49)
PROTHROMBIN TIME: 14.4 s (ref 11.6–15.2)

## 2014-09-29 MED ORDER — FENTANYL 25 MCG/HR TD PT72
25.0000 ug | MEDICATED_PATCH | TRANSDERMAL | Status: DC
  Administered 2014-09-29: 25 ug via TRANSDERMAL
  Filled 2014-09-29: qty 1

## 2014-09-29 MED ORDER — WARFARIN SODIUM 4 MG PO TABS
4.0000 mg | ORAL_TABLET | Freq: Once | ORAL | Status: AC
  Administered 2014-09-29: 4 mg via ORAL
  Filled 2014-09-29: qty 1

## 2014-09-29 MED ORDER — HYDROCODONE-ACETAMINOPHEN 5-325 MG PO TABS
1.0000 | ORAL_TABLET | ORAL | Status: DC | PRN
  Administered 2014-09-29 – 2014-10-01 (×6): 2 via ORAL
  Filled 2014-09-29 (×6): qty 2

## 2014-09-29 MED ORDER — PANTOPRAZOLE SODIUM 40 MG PO TBEC
40.0000 mg | DELAYED_RELEASE_TABLET | Freq: Two times a day (BID) | ORAL | Status: DC
  Administered 2014-09-29 – 2014-10-01 (×4): 40 mg via ORAL
  Filled 2014-09-29 (×3): qty 1

## 2014-09-29 NOTE — Progress Notes (Signed)
ANTICOAGULATION CONSULT NOTE - Follow up  Pharmacy Consult for Warfarin Indication: Hx PE  Allergies  Allergen Reactions  . Tomato Other (See Comments)    Unknown reaction    Patient Measurements: Height: 5\' 5"  (165.1 cm) Weight: 244 lb 11.4 oz (111 kg) IBW/kg (Calculated) : 61.5  Vital Signs: Temp: 99.2 F (37.3 C) (03/04 0652) Temp Source: Oral (03/04 0652) BP: 142/78 mmHg (03/04 0652) Pulse Rate: 93 (03/04 0652)  Labs:  Recent Labs  09/27/14 0345 09/28/14 0536 09/29/14 0430  HGB 8.0* 7.6* 7.8*  HCT 24.8* 23.3* 24.3*  PLT 211 214 307  LABPROT 14.8 14.7 14.4  INR 1.14 1.14 1.11  CREATININE 2.23* 2.23* 1.98*    Estimated Creatinine Clearance: 47.3 mL/min (by C-G formula based on Cr of 1.98).  Assessment: 77 yoM [facility] ED [facility]F on [facility] with cough,SOB, and concern for HCAP or UTI.  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  PTA warfarin dose was 4mg  daily except 4.5mg  on Tues/Thurs/Sun.  INR was therapeutic on admission.  Pharmacy was initially consulted to dose warfarin, but was changed to heparin perioperatively (see events below).  Dr. Carles Collet and Urology agree that warfarin can now be safely resumed post-op.  Significant events: 2/21 - 2/25 continued warfarin inpatient 2/26 Given Vitamin K for supratherapeutic INR 2/27 Transitioned to heparin perioperatively 2/29 HLs previously therapeutic, but heparin now held d/t hematuria and upcoming procedure.  Transferred to [facility]. 3/1 S/p cystoscopy w/ bilateral ureteral stent placement.  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 3/3 Resumed warfarin  Today, 09/29/2014:  INR 1.11, subtherapeutic, not rising yet after one dose of Coumadin.  CBC: Hgb low but stable from yesterday. Pltc remain wnl.  Bleeding: hematuria is present (and expected per urology), but improving per RN.  Diet:  regular  Drug-drug interactions: no major interactions noted  Goal of Therapy:  INR 2-3 Monitor platelets by anticoagulation protocol: Yes   Plan:   Warfarin 4 mg PO x1 dose today at 1800.  Conservative warfarin dosing (resuming home dose instead of boosted dose) due to ongoing hematuria.  Daily INR  Follow up CBC and hematuria closely.  Romeo Rabon, PharmD, pager [phone]. 09/29/2014,12:23 PM.

## 2014-09-29 NOTE — Progress Notes (Signed)
   Patient with temperature of 100 and heart rate has increased some.  Patient without complaint.  Filed Vitals:   09/29/14 0652  BP: 142/78  Pulse: 93  Temp: 99.2 F (37.3 C)  Resp: 20    Intake/Output Summary (Last 24 hours) at 09/29/14 1440 Last data filed at 09/29/14 1200  Gross per 24 hour  Intake    970 ml  Output   3200 ml  Net  -2230 ml    Physical exam: Patient in no acute distress Urine light pink, clear  Assessment: High-grade urothelial cancer, bilateral hydronephrosis -Discussed path with patient and that he may need some chemotherapy.  I can set him up with oncology outpatient.  I'll present him at [facility] Multidisciplinary tumor board next week.  I am concerned about his temperature and heart rate.  Possible he is getting another urinary tract infection.  Ordered renal ultrasound as there is a chance the left stent could fail and not drain the kidney.  If he were to get another infection he may need a left nephrostomy tube.  If he remains stable he can be discharged from urologic point of view.

## 2014-09-29 NOTE — Progress Notes (Signed)
Clinical Social Work  Patient was discussed during progression meeting and MD reports that patient is not medically stable to DC. CSW updated [facility] who is agreeable to accept patient back once medically stable.  Sindy Messing, LCSW (Coverage for [employer])

## 2014-09-29 NOTE — Progress Notes (Signed)
Took AD paperwork to patient and went over it with him. Patient is blind. He asked me to fill in his name and DOB. He also asked me to "x" places where he needed to initial statements. He has two daughters and wants to make them his HPOAs but he did not remember their contact information. One of his daughters is coming this weekend. He will have her fill in the information. I instructed his to let the nurse know when his daughter is finished. Then staff will find a notary for him.    Epifania Gore, PhD, [place]

## 2014-09-29 NOTE — Progress Notes (Signed)
Protonix was changed to PO per protocol. Romeo Rabon, PharmD, pager [phone]. 09/29/2014,1:45 PM.

## 2014-09-29 NOTE — Progress Notes (Signed)
PROGRESS NOTE  Samuel [name] DTO:[id number] DOB: 15-Feb-1956 DOA: 09/17/2014 PCP: [name], MD  Assessment/Plan: Hematuria/bilateral hydronephrosis due to bladder wall thickening and tumor- -Patient on chronic anticoagulation presented with hematuria initially felt to be secondary to recent Foley catheter exchange, but likely due to bladder tumor -Renal ultrasound showed the presence of bilateral hydronephrosis, CT scan of the abdomen and pelvis showed moderate to severe left hydronephrosis with bladder wall thickening and possible tumor.  - 3/1--TURBT for bladder tumor and ureteral stent 3/1 at [facility] Dr. [name] -still having a little hematuria in foley bag -restarted coumadin without bridging on 09/28/14 -09/29/14--WBC increasing with tachycardia-->UA and urine culture and blood culture-->Dr. 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 [name] discussed with patient  History of pulmonary embolism after surgery for left hip fracture in Feb 2015. 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 Awake and alert.  - workup so far negative, CT head negative, EEG did not show any epileptiform activity. - meropenem and narcotics were discontinued - neurology was consulted. Patient was initially placed on Depakote. Now patient is on Lamictal. Depakote was discontinued.  Seizure episode - No further seizures. Now on Lamictal and Lyrica. Neurology has been following - EEG does not reveal any epileptiform activity. No other  abnormalities noted.   Healthcare associated pneumonia/ left lower lobe infiltrates -All antibiotics discontinued following acute encephalopathy/seizure, ID following. Patient has received appropriate course of meropenem for HCAP, has urinary colonization with drug-resistant Klebsiella and Acinetobacter.  -Blood cultures negative    Klebsiella and Acinetobacter UTI - Patient has a history of chronic Foley catheter due to neurogenic bladder,has chronic colonization. - Meropenem has been discontinued. ID, Dr. [name] recommended pharmacy to give preoperative dose of tobramycin before his upcoming urological procedure.  Acute kidney injury with bilateral hydronephrosis. -Renal function is stable. Nephrology had been following.  -Potassium was repeated. -creatinine remains elevated but stable-->slowly improving  Hypokalemia, hypomagnesemia - This was being aggressively repleted.   Suicide ideation  During this hospitalization patient mentioned that he wanted to 'kill himself and get out of this mess', one-to-one sitter was placed.  -Psychiatry cleared pt for d/c  Essential hypertension Continue metoprolol tartrate--converted to po  Acute blood loss anemia due to hematuria Continue to monitor. Transfuse as needed. -also a dilutional component  Chronic pain -pt was stable without fentanyl patch--restarted at lower dose (25 mcg) -oxycodone IR for pain -DC intravenous Dilaudid-   Family Communication:   Pt at beside Disposition Plan:   SNF when medically stable      Procedures/Studies: Ct Abdomen Pelvis Wo Contrast  09/25/2014   CLINICAL DATA:  Hydronephrosis  EXAM: CT ABDOMEN AND PELVIS WITHOUT CONTRAST  TECHNIQUE: Multidetector CT imaging of the abdomen and pelvis was performed following the standard protocol without IV contrast.  COMPARISON:  09/19/2014  FINDINGS: BODY WALL: Symmetric gynecomastia  LOWER CHEST: Decreasing airspace disease in the right lower lobe.  Clustered centrilobular nodules in the right middle lobe are unchanged and could be chronic.  Small hiatal hernia.  ABDOMEN/PELVIS:  Liver: Hepatic steatosis.  Biliary: Distended gallbladder with calcified  gallstones. There is no evidence of acute inflammation.  Pancreas: Unremarkable.  Spleen: Unremarkable.  Adrenals: Unremarkable.  Kidneys and ureters: Moderate bilateral hydroureteronephrosis, despite decompressed bladder. Nonobstructive stones seen in the left lower pole, up to 5 mm.  Bladder: Irregular thickening of the decompressed bladder, suspicious for tumor. A Foley catheter remains in good position.  Reproductive: Unremarkable.  Bowel: Descending colostomy with unchanged tissue in the low pelvis. Prominent volume of formed stool throughout the colon. No evidence of bowel obstruction. No inflammatory bowel wall thickening.  Retroperitoneum: Re- demonstrated right external iliac chain lymphadenopathy, including a 27 x 42 mm mass. No periaortic lymphadenopathy.  Peritoneum: No ascites or pneumoperitoneum.  Vascular: No acute abnormality.  OSSEOUS: Sacrificed spinal catheter fragments noted. There is extensive spinal fusion and sacral truncation. The patient is status post left hip disarticulation and right intertrochanteric femur fracture ORIF. 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  IMPRESSION: 1. 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 2. Right pelvic adenopathy compatible with metastatic disease. 3. Improving pneumonia in the right lower lobe. 4. Chronic findings are stable from prior and noted above.   Electronically Signed   By: [name] M.D.   On: 09/25/2014 05:05   Ct Abdomen Pelvis Wo Contrast  09/19/2014   CLINICAL DATA:  59 year old male with acute kidney injury, bilateral hydronephrosis on recent renal ultrasound, hematuria and elevated white count. 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  EXAM: CT ABDOMEN AND PELVIS WITHOUT  CONTRAST  TECHNIQUE: Multidetector CT imaging of the abdomen and pelvis was performed following the standard protocol without IV contrast.  COMPARISON:  09/18/2014 ultrasound. 08/30/2014 abdominal-pelvic CT from [facility]. 07/05/2014 chest CT.  FINDINGS: Please note that parenchymal abnormalities may be missed without intravenous contrast.  Lower chest: Right basilar airspace disease and atelectasis have increased since 08/30/2014. Increasing left basilar atelectasis is noted. There are tiny bilateral pleural effusions present.  Hepatobiliary: No hepatic abnormalities are identified. Cholelithiasis again noted without CT evidence of acute cholecystitis. There is no evidence of biliary dilatation.  Pancreas: Unremarkable  Spleen: Unremarkable  Adrenals/Urinary Tract: Moderate to severe bilateral hydronephrosis to the bladder again noted, stable on the right and increased on the left since 08/30/2014. A Foley catheter within a collapsed bladder is noted but again noted is abnormal increased density within the bladder versus bladder wall thickening, left-greater-than-right.  A nonobstructing 6 mm mid left renal calculus is noted.  The adrenal glands are unremarkable.  Stomach/Bowel: Prior PEG tube changes again identified. There is no evidence of bowel obstruction or pneumoperitoneum. Left colostomy changes again noted.  Vascular/Lymphatic: Enlarged iliac lymph nodes are unchanged with an index right external iliac lymph node again measuring 2.8 cm (image 64). No new enlarged lymph nodes are identified.  Reproductive: The prostate gland is enlarged.  Other: No evidence of free fluid.  Musculoskeletal: Chronic left hemipelvis bony deformities are again noted including absence of the proximal left femur, chronic right femoral head destruction, flattening and dysplasia of both acetabula, probable right hip effusion and lower sacral/ coccygeal deformity. Prior lumbar spine fusion, thoracolumbar posterior  decompression and probable intraspinal pain catheter again noted.  IMPRESSION: Unchanged abnormal increased density within the bladder/bladder wall thickening suspicious for tumor. Moderate to severe bilateral hydronephrosis again noted, stable on the right and increased on the left since 08/30/2014, likely related to tumor obstruction.  Unchanged iliac lymphadenopathy, worrisome for metastatic disease.  Increased airspace disease and atelectasis at the right lung base with aspiration or pneumonia considerations. Increased left  basilar atelectasis and new tiny bilateral pleural effusions.  Chronic bony changes within the pelvis.  Cholelithiasis without CT evidence of acute cholecystitis.  Prostatomegaly.   Electronically Signed   By: Margarette Canada M.D.   On: 09/19/2014 11:25   Dg Chest 2 View  09/20/2014   CLINICAL DATA:  Pneumonia.  EXAM: CHEST  2 VIEW  COMPARISON:  09/17/2014  FINDINGS: Right PICC line is in place with the tip in the SVC. Very low lung volumes. Elevation of the right hemidiaphragm. Limited evaluation due to the low volumes. There is cardiomegaly. No acute bony abnormality.  IMPRESSION: Limited study due to very low lung volumes. Findings similar to prior study.  Right PICC line tip in the SVC.   Electronically Signed   By: Rolm Baptise M.D.   On: 09/20/2014 12:06   Ct Head Wo Contrast  09/23/2014   CLINICAL DATA:  Took Ativan to sleep last night. Not responding this morning. Altered mental status.  EXAM: CT HEAD WITHOUT CONTRAST  TECHNIQUE: Contiguous axial images were obtained from the base of the skull through the vertex without intravenous contrast.  COMPARISON:  12/07/2013  FINDINGS: Patient was uncooperative for the study which had to be repeated several times due to motion. No visible acute intracranial abnormality. No visible acute infarction. No hemorrhage or hydrocephalus. No extra-axial fluid collection. Paranasal sinuses and mastoids are clear. No acute calvarial abnormality.   IMPRESSION: Typical, somewhat limited study due to patient motion. Study was repeated several times. No visible acute intracranial abnormality. With the multiple attempts, the study is felt to be diagnostic.   Electronically Signed   By: Rolm Baptise M.D.   On: 09/23/2014 11:12   US Renal  09/29/2014   CLINICAL DATA:  Acute renal failure, hematuria  EXAM: RENAL/URINARY TRACT ULTRASOUND COMPLETE  COMPARISON:  09/25/2014  FINDINGS: Right Kidney:  Length: 11.3 cm. Echogenicity within normal limits. No mass or hydronephrosis visualized.  Left Kidney:  Length: 12.4 cm. Echogenicity within normal limits. No mass or hydronephrosis visualized.  Bladder:  Decompressed by Foley catheter.  IMPRESSION: No acute abnormality is noted. The previously seen hydronephrosis has resolved in the interval.   Electronically Signed   By: Inez Catalina M.D.   On: 09/29/2014 15:57   US Renal  09/18/2014   CLINICAL DATA:  Acute renal failure  EXAM: RENAL/URINARY TRACT ULTRASOUND COMPLETE  COMPARISON:  CT, 08/30/2014  FINDINGS: Right Kidney:  Length: 13.2 cm. Normal parenchymal echogenicity. No mass or stone. Moderate hydronephrosis.  Left Kidney:  Length: 13.2 cm. Normal parenchymal echogenicity. No mass or stone. Mild to moderate hydronephrosis.  Bladder:  Decompressed by a Foley catheter.  IMPRESSION: 1. Bilateral hydronephrosis, moderate on the right and mild to moderate on the left. Hydronephrosis on the left appears increased when compared to the prior CT. That on the right is similar.   Electronically Signed   By: Lajean Manes M.D.   On: 09/18/2014 17:14   Dg Chest Port 1 View  09/28/2014   CLINICAL DATA:  PICC placement  EXAM: PORTABLE CHEST - 1 VIEW  COMPARISON:  09/22/2014  FINDINGS: Right PICC tip projects in the lower superior vena cava.  Lung volumes are low as that were on the prior study. There is persistent lung base atelectasis. No convincing pneumonia. No pulmonary edema. No pneumothorax.  IMPRESSION: Right PICC line  tip projects in the lower superior vena cava. No convincing pneumonia. No pulmonary edema. Persistent basilar atelectasis.   Electronically Signed   By: Shanon Brow  Ormond M.D.   On: 09/28/2014 13:37   Dg Chest Port 1 View  09/22/2014   CLINICAL DATA:  Respiratory distress.  EXAM: PORTABLE CHEST - 1 VIEW  COMPARISON:  09/20/2014  FINDINGS: Right PICC line remains in place, unchanged. Very low lung volumes with cardiomegaly and bibasilar atelectasis. No overt edema. No visible effusions. No acute bony abnormality.  IMPRESSION: Very low lung volumes with cardiomegaly and bibasilar atelectasis.   Electronically Signed   By: Rolm Baptise M.D.   On: 09/22/2014 20:49   Dg Chest Port 1 View  09/17/2014   CLINICAL DATA:  Initial evaluation for shortness of breath  EXAM: PORTABLE CHEST - 1 VIEW  COMPARISON:  06/28/2014  FINDINGS: Very limited inspiratory effect. Right lung clear except for mild atelectasis at the base. The retrocardiac left lower lobe is difficult to evaluate. The left upper lobe is clear.  IMPRESSION: Limited study due to very limited inspiratory effect. Retrocardiac left lower lobe difficult to evaluate but left lower lobe infiltrate suspected.   Electronically Signed   By: Skipper Cliche M.D.   On: 09/17/2014 16:30         Subjective: Patient denies fevers, chills, headache, chest pain, dyspnea, nausea, vomiting, diarrhea, abdominal pain, dysuria, hematuria   Objective: Filed Vitals:   09/28/14 1432 09/28/14 2139 09/29/14 0652 09/29/14 1635  BP: 119/56 155/73 142/78 111/62  Pulse: 92 105 93 81  Temp: 98.9 F (37.2 C) 100 F (37.8 C) 99.2 F (37.3 C) 99.7 F (37.6 C)  TempSrc: Oral Oral Oral Oral  Resp: 20 22 20 20   Height:      Weight:   111 kg (244 lb 11.4 oz)   SpO2: 93% 94% 93% 94%    Intake/Output Summary (Last 24 hours) at 09/29/14 1831 Last data filed at 09/29/14 1637  Gross per 24 hour  Intake    970 ml  Output   4100 ml  Net  -3130 ml   Weight change:   Exam:   General:  Pt is alert, follows commands appropriately, not in acute distress  HEENT: No icterus, No thrush,  [place]/AT  Cardiovascular: RRR, S1/S2, no rubs, no gallops  Respiratory: Bibasilar crackles, R>L  Abdomen: Soft/+BS, non tender, non distended, no guarding  Extremities: 1+RLE edema, No lymphangitis, No petechiae, No rashes, no synovitis  Data Reviewed: Basic Metabolic Panel:  Recent Labs Lab 09/25/14 0355 09/25/14 1830 09/25/14 2359 09/26/14 0735 09/27/14 0345 09/28/14 0536 09/29/14 0430  NA 131* 136 133* 137 135 132* 136  K 2.0* 2.6* 2.9* 2.9* 3.3* 3.6 3.8  CL 99 102 102 104 104 100 100  CO2 26 25 24 24 26 25 27   GLUCOSE 111* 147* 117* 123* 108* 111* 105*  BUN 18 21 20 20 17 18 19   CREATININE 2.04* 1.99* 2.24* 2.27* 2.23* 2.23* 1.98*  CALCIUM 7.2* 7.6* 7.3* 7.8* 7.7* 7.6* 8.2*  MG 1.3* 2.5  --   --  2.0 1.5  --    Liver Function Tests:  Recent Labs Lab 09/23/14 1307 09/25/14 2359  AST 20 17  ALT 12 12  ALKPHOS 59 49  BILITOT 1.1 0.6  PROT 7.0 5.8*  ALBUMIN 3.3* 3.0*   No results for input(s): LIPASE, AMYLASE in the last 168 hours.  Recent Labs Lab 09/23/14 1307  AMMONIA 22   CBC:  Recent Labs Lab 09/25/14 0355 09/25/14 2359 09/27/14 0345 09/28/14 0536 09/29/14 0430  WBC 11.4* 11.3* 12.1* 13.2* 14.8*  HGB 9.0* 8.3* 8.0* 7.6* 7.8*  HCT  26.7* 24.8* 24.8* 23.3* 24.3*  MCV 79.7 81.0 84.1 84.1 84.7  PLT 187 199 211 214 307   Cardiac Enzymes: No results for input(s): CKTOTAL, CKMB, CKMBINDEX, TROPONINI in the last 168 hours. BNP: Invalid input(s): POCBNP CBG:  Recent Labs Lab 09/22/14 2014 09/23/14 1253 09/27/14 0803  GLUCAP 91 101* 106*    Recent Results (from the past 240 hour(s))  Culture, Urine     Status: None   Collection Time: 09/22/14  9:02 PM  Result Value Ref Range Status   Specimen Description URINE, CATHETERIZED  Final   Special Requests NONE  Final   Colony Count   Final    15,000 COLONIES/ML Performed  at [facility]    Culture   Final    ENTEROCOCCUS SPECIES Performed at [facility]    Report Status 09/25/2014 FINAL  Final   Organism ID, Bacteria ENTEROCOCCUS SPECIES  Final      Susceptibility   Enterococcus species - MIC*    AMPICILLIN <=2 SENSITIVE Sensitive     LEVOFLOXACIN >=8 RESISTANT Resistant     NITROFURANTOIN <=16 SENSITIVE Sensitive     VANCOMYCIN 1 SENSITIVE Sensitive     TETRACYCLINE >=16 RESISTANT Resistant     * ENTEROCOCCUS SPECIES  Culture, blood (routine x 2)     Status: None   Collection Time: 09/22/14 11:50 PM  Result Value Ref Range Status   Specimen Description BLOOD LEFT ARM  Final   Special Requests BOTTLES DRAWN AEROBIC AND ANAEROBIC 3CC  Final   Culture   Final    NO GROWTH 5 DAYS Performed at [facility]    Report Status 09/29/2014 FINAL  Final  Culture, blood (routine x 2)     Status: None   Collection Time: 09/22/14 11:56 PM  Result Value Ref Range Status   Specimen Description BLOOD LEFT HAND  Final   Special Requests BOTTLES DRAWN AEROBIC ONLY 2CC  Final   Culture   Final    NO GROWTH 5 DAYS Performed at [facility]    Report Status 09/29/2014 FINAL  Final     Scheduled Meds: . antiseptic oral rinse  7 mL Mouth Rinse BID  . atorvastatin  10 mg Oral QHS  . docusate sodium  100 mg Oral BID  . feeding supplement (ENSURE COMPLETE)  237 mL Oral BID BM  . fentaNYL  25 mcg Transdermal Q72H  . lamoTRIgine  200 mg Oral BID  . levothyroxine  125 mcg Oral QAC breakfast  . metoprolol tartrate  25 mg Oral BID  . oxybutynin  5 mg Oral Daily  . pantoprazole  40 mg Oral BID AC  . predniSONE  10 mg Oral Q breakfast  . pregabalin  75 mg Oral TID  . QUEtiapine  100 mg Oral BID  . saccharomyces boulardii  250 mg Oral BID  . senna  2 tablet Oral Daily  . sodium chloride  3 mL Intravenous Q12H  . Warfarin - Pharmacist Dosing Inpatient   Does not apply q1800   Continuous Infusions:    Erving Sassano, DO  [facility] Pager [phone]  If 7PM-7AM, please contact night-coverage [email or web address] Password [email or web address] 09/29/2014, 6:31 PM   LOS: 12 days

## 2014-09-30 LAB — COMPREHENSIVE METABOLIC PANEL
ALT: 10 U/L (ref 0–53)
AST: 13 U/L (ref 0–37)
Albumin: 3.3 g/dL — ABNORMAL LOW (ref 3.5–5.2)
Alkaline Phosphatase: 54 U/L (ref 39–117)
Anion gap: 10 (ref 5–15)
BUN: 19 mg/dL (ref 6–23)
CO2: 29 mmol/L (ref 19–32)
Calcium: 8.9 mg/dL (ref 8.4–10.5)
Chloride: 100 mmol/L (ref 96–112)
Creatinine, Ser: 1.59 mg/dL — ABNORMAL HIGH (ref 0.50–1.35)
GFR, EST AFRICAN AMERICAN: 54 mL/min — AB (ref >90)
GFR, EST NON AFRICAN AMERICAN: 47 mL/min — AB (ref >90)
Glucose, Bld: 103 mg/dL — ABNORMAL HIGH (ref 70–99)
POTASSIUM: 3.8 mmol/L (ref 3.5–5.1)
Sodium: 139 mmol/L (ref 135–145)
TOTAL PROTEIN: 6.9 g/dL (ref 6.0–8.3)
Total Bilirubin: 0.4 mg/dL (ref 0.3–1.2)

## 2014-09-30 LAB — CBC
HCT: 24.7 % — ABNORMAL LOW (ref 39.0–52.0)
Hemoglobin: 7.9 g/dL — ABNORMAL LOW (ref 13.0–17.0)
MCH: 27.1 pg (ref 26.0–34.0)
MCHC: 32 g/dL (ref 30.0–36.0)
MCV: 84.6 fL (ref 78.0–100.0)
PLATELETS: 377 10*3/uL (ref 150–400)
RBC: 2.92 MIL/uL — AB (ref 4.22–5.81)
RDW: 15.4 % (ref 11.5–15.5)
WBC: 13.6 10*3/uL — AB (ref 4.0–10.5)

## 2014-09-30 LAB — URINE CULTURE
CULTURE: NO GROWTH
Colony Count: NO GROWTH

## 2014-09-30 LAB — PROTIME-INR
INR: 1.15 (ref 0.00–1.49)
PROTHROMBIN TIME: 14.9 s (ref 11.6–15.2)

## 2014-09-30 MED ORDER — WARFARIN SODIUM 5 MG PO TABS
5.0000 mg | ORAL_TABLET | Freq: Once | ORAL | Status: AC
  Administered 2014-09-30: 5 mg via ORAL
  Filled 2014-09-30: qty 1

## 2014-09-30 MED ORDER — CLONAZEPAM 0.5 MG PO TABS
0.5000 mg | ORAL_TABLET | Freq: Three times a day (TID) | ORAL | Status: DC | PRN
  Administered 2014-09-30: 0.5 mg via ORAL
  Filled 2014-09-30: qty 1

## 2014-09-30 NOTE — Progress Notes (Signed)
Pt refused metoprolol tonight and stated he would talk about it with his MD in the am. RN informed him that it was his right to refuse medication, but his BP was high and the medication would help to bring it down. Pt verbalized understanding but still refused medication. Will continue to monitor pt. Carnella [place] I

## 2014-09-30 NOTE — Progress Notes (Signed)
RN went in room to do shift assessment and found PICC IV line unscrewed from the pt's PICC line. IV team consulted regarding this. Will continue to monitor. Carnella [place] I

## 2014-09-30 NOTE — Progress Notes (Signed)
PROGRESS NOTE  Samuel [name] XBM:[id number] DOB: Feb 24, 1956 DOA: 09/17/2014 PCP: [name], MD  Assessment/Plan: Hematuria/bilateral hydronephrosis due to bladder wall thickening and tumor- -Patient on chronic anticoagulation presented with hematuria initially felt to be secondary to recent Foley catheter exchange, but likely due to bladder tumor -Renal ultrasound showed the presence of bilateral hydronephrosis, CT scan of the abdomen and pelvis showed moderate to severe left hydronephrosis with bladder wall thickening and possible tumor.  - 3/1--TURBT for bladder tumor and ureteral stent 3/1 at [facility] Dr. [name] -still having a little hematuria in foley bag -restarted coumadin without bridging on 09/28/14 -09/29/14--WBC increasing with tachycardia-->UA and urine culture and blood culture-->Dr. 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 [name] discussed with patient -09/29/2014 renal ultrasound--resolution of bilateral hydronephrosis -09/29/2014 blood cultures negative to date  History of pulmonary embolism after surgery for left hip fracture in Feb 2015. 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 Awake and alert.  - workup so far negative, CT head negative, EEG did not show any epileptiform activity. - meropenem and narcotics were discontinued - neurology was consulted. Patient was initially placed on Depakote. Now patient is on Lamictal.  Depakote was discontinued.  Seizure episode - No further seizures. Now on Lamictal and Lyrica. Neurology has been following - EEG does not reveal any epileptiform activity. No other abnormalities noted.   Healthcare associated pneumonia/ left lower lobe infiltrates -All antibiotics discontinued following acute encephalopathy/seizure, ID following. Patient has received appropriate course of meropenem for HCAP, has urinary colonization with drug-resistant Klebsiella and Acinetobacter.  -Blood cultures negative   Klebsiella and Acinetobacter UTI - Patient has a history of chronic Foley catheter due to neurogenic bladder,has chronic colonization. - Meropenem has been discontinued. ID, Dr. [name] recommended pharmacy to give preoperative dose of tobramycin before his upcoming urological procedure.  Acute kidney injury with bilateral hydronephrosis. -Renal function is stable. Nephrology had been following.  -Potassium was repeated. -creatinine slowly improving -baseline creatinine 0.7-1.0  Hypokalemia, hypomagnesemia - This was being aggressively repleted.   Suicide ideation  During this hospitalization patient mentioned that he wanted to 'kill himself and get out of this mess', one-to-one sitter was placed.  -Psychiatry cleared pt for d/c  Essential hypertension Continue metoprolol tartrate--converted to po  Acute blood loss anemia due to hematuria Continue to monitor. Transfuse as needed. -also a dilutional component  Chronic pain -pt was stable without fentanyl patch--restarted at lower dose (25 mcg) -oxycodone IR for pain -DC intravenous Dilaudid -restart Klonopin for anxiety   Family Communication: Pt at beside Disposition Plan: SNF 1-2 days if stable       Procedures/Studies: Ct Abdomen Pelvis Wo Contrast  09/25/2014   CLINICAL DATA:  Hydronephrosis  EXAM: CT ABDOMEN AND PELVIS WITHOUT CONTRAST  TECHNIQUE: Multidetector CT imaging of the abdomen and  pelvis was performed following the standard protocol without IV contrast.  COMPARISON:  09/19/2014  FINDINGS: BODY WALL: Symmetric gynecomastia  LOWER CHEST: Decreasing airspace disease in the right lower lobe. Clustered centrilobular nodules in the right middle lobe are  unchanged and could be chronic.  Small hiatal hernia.  ABDOMEN/PELVIS:  Liver: Hepatic steatosis.  Biliary: Distended gallbladder with calcified gallstones. There is no evidence of acute inflammation.  Pancreas: Unremarkable.  Spleen: Unremarkable.  Adrenals: Unremarkable.  Kidneys and ureters: Moderate bilateral hydroureteronephrosis, despite decompressed bladder. Nonobstructive stones seen in the left lower pole, up to 5 mm.  Bladder: Irregular thickening of the decompressed bladder, suspicious for tumor. A Foley catheter remains in good position.  Reproductive: Unremarkable.  Bowel: Descending colostomy with unchanged tissue in the low pelvis. Prominent volume of formed stool throughout the colon. No evidence of bowel obstruction. No inflammatory bowel wall thickening.  Retroperitoneum: Re- demonstrated right external iliac chain lymphadenopathy, including a 27 x 42 mm mass. No periaortic lymphadenopathy.  Peritoneum: No ascites or pneumoperitoneum.  Vascular: No acute abnormality.  OSSEOUS: Sacrificed spinal catheter fragments noted. There is extensive spinal fusion and sacral truncation. The patient is status post left hip disarticulation and right intertrochanteric femur fracture ORIF. 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  IMPRESSION: 1. 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 2. Right pelvic adenopathy compatible with metastatic disease. 3. Improving pneumonia in the right lower lobe. 4. Chronic findings are stable from prior and noted above.   Electronically Signed   By: [name] M.D.   On: 09/25/2014 05:05   Ct Abdomen Pelvis Wo Contrast  09/19/2014   CLINICAL DATA:  59 year old male  with acute kidney injury, bilateral hydronephrosis on recent renal ultrasound, hematuria and elevated white count. 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  EXAM: CT ABDOMEN AND PELVIS WITHOUT CONTRAST  TECHNIQUE: Multidetector CT imaging of the abdomen and pelvis was performed following the standard protocol without IV contrast.  COMPARISON:  09/18/2014 ultrasound. 08/30/2014 abdominal-pelvic CT from [facility]. 07/05/2014 chest CT.  FINDINGS: Please note that parenchymal abnormalities may be missed without intravenous contrast.  Lower chest: Right basilar airspace disease and atelectasis have increased since 08/30/2014. Increasing left basilar atelectasis is noted. There are tiny bilateral pleural effusions present.  Hepatobiliary: No hepatic abnormalities are identified. Cholelithiasis again noted without CT evidence of acute cholecystitis. There is no evidence of biliary dilatation.  Pancreas: Unremarkable  Spleen: Unremarkable  Adrenals/Urinary Tract: Moderate to severe bilateral hydronephrosis to the bladder again noted, stable on the right and increased on the left since 08/30/2014. A Foley catheter within a collapsed bladder is noted but again noted is abnormal increased density within the bladder versus bladder wall thickening, left-greater-than-right.  A nonobstructing 6 mm mid left renal calculus is noted.  The adrenal glands are unremarkable.  Stomach/Bowel: Prior PEG tube changes again identified. There is no evidence of bowel obstruction or pneumoperitoneum. Left colostomy changes again noted.  Vascular/Lymphatic: Enlarged iliac lymph nodes are unchanged with an index right external iliac lymph node again measuring 2.8 cm (image 64). No new enlarged lymph nodes are identified.  Reproductive: The prostate gland is enlarged.  Other: No evidence of free fluid.  Musculoskeletal: Chronic left hemipelvis bony deformities are again noted including absence of the proximal left femur, chronic  right femoral head destruction, flattening and dysplasia of both acetabula, probable right hip effusion and lower sacral/ coccygeal deformity. Prior lumbar spine fusion, thoracolumbar posterior decompression and probable intraspinal pain catheter again noted.  IMPRESSION: Unchanged abnormal increased density within the bladder/bladder wall thickening suspicious for tumor. Moderate to severe bilateral hydronephrosis again noted, stable on the right and increased on the left since 08/30/2014, likely related to tumor obstruction.  Unchanged iliac lymphadenopathy, worrisome  for metastatic disease.  Increased airspace disease and atelectasis at the right lung base with aspiration or pneumonia considerations. Increased left basilar atelectasis and new tiny bilateral pleural effusions.  Chronic bony changes within the pelvis.  Cholelithiasis without CT evidence of acute cholecystitis.  Prostatomegaly.   Electronically Signed   By: Margarette Canada M.D.   On: 09/19/2014 11:25   Dg Chest 2 View  09/20/2014   CLINICAL DATA:  Pneumonia.  EXAM: CHEST  2 VIEW  COMPARISON:  09/17/2014  FINDINGS: Right PICC line is in place with the tip in the SVC. Very low lung volumes. Elevation of the right hemidiaphragm. Limited evaluation due to the low volumes. There is cardiomegaly. No acute bony abnormality.  IMPRESSION: Limited study due to very low lung volumes. Findings similar to prior study.  Right PICC line tip in the SVC.   Electronically Signed   By: Rolm Baptise M.D.   On: 09/20/2014 12:06   Ct Head Wo Contrast  09/23/2014   CLINICAL DATA:  Took Ativan to sleep last night. Not responding this morning. Altered mental status.  EXAM: CT HEAD WITHOUT CONTRAST  TECHNIQUE: Contiguous axial images were obtained from the base of the skull through the vertex without intravenous contrast.  COMPARISON:  12/07/2013  FINDINGS: Patient was uncooperative for the study which had to be repeated several times due to motion. No visible acute  intracranial abnormality. No visible acute infarction. No hemorrhage or hydrocephalus. No extra-axial fluid collection. Paranasal sinuses and mastoids are clear. No acute calvarial abnormality.  IMPRESSION: Typical, somewhat limited study due to patient motion. Study was repeated several times. No visible acute intracranial abnormality. With the multiple attempts, the study is felt to be diagnostic.   Electronically Signed   By: Rolm Baptise M.D.   On: 09/23/2014 11:12   US Renal  09/29/2014   CLINICAL DATA:  Acute renal failure, hematuria  EXAM: RENAL/URINARY TRACT ULTRASOUND COMPLETE  COMPARISON:  09/25/2014  FINDINGS: Right Kidney:  Length: 11.3 cm. Echogenicity within normal limits. No mass or hydronephrosis visualized.  Left Kidney:  Length: 12.4 cm. Echogenicity within normal limits. No mass or hydronephrosis visualized.  Bladder:  Decompressed by Foley catheter.  IMPRESSION: No acute abnormality is noted. The previously seen hydronephrosis has resolved in the interval.   Electronically Signed   By: Inez Catalina M.D.   On: 09/29/2014 15:57   US Renal  09/18/2014   CLINICAL DATA:  Acute renal failure  EXAM: RENAL/URINARY TRACT ULTRASOUND COMPLETE  COMPARISON:  CT, 08/30/2014  FINDINGS: Right Kidney:  Length: 13.2 cm. Normal parenchymal echogenicity. No mass or stone. Moderate hydronephrosis.  Left Kidney:  Length: 13.2 cm. Normal parenchymal echogenicity. No mass or stone. Mild to moderate hydronephrosis.  Bladder:  Decompressed by a Foley catheter.  IMPRESSION: 1. Bilateral hydronephrosis, moderate on the right and mild to moderate on the left. Hydronephrosis on the left appears increased when compared to the prior CT. That on the right is similar.   Electronically Signed   By: Lajean Manes M.D.   On: 09/18/2014 17:14   Dg Chest Port 1 View  09/28/2014   CLINICAL DATA:  PICC placement  EXAM: PORTABLE CHEST - 1 VIEW  COMPARISON:  09/22/2014  FINDINGS: Right PICC tip projects in the lower superior vena  cava.  Lung volumes are low as that were on the prior study. There is persistent lung base atelectasis. No convincing pneumonia. No pulmonary edema. No pneumothorax.  IMPRESSION: Right PICC line tip projects in the  lower superior vena cava. No convincing pneumonia. No pulmonary edema. Persistent basilar atelectasis.   Electronically Signed   By: Lajean Manes M.D.   On: 09/28/2014 13:37   Dg Chest Port 1 View  09/22/2014   CLINICAL DATA:  Respiratory distress.  EXAM: PORTABLE CHEST - 1 VIEW  COMPARISON:  09/20/2014  FINDINGS: Right PICC line remains in place, unchanged. Very low lung volumes with cardiomegaly and bibasilar atelectasis. No overt edema. No visible effusions. No acute bony abnormality.  IMPRESSION: Very low lung volumes with cardiomegaly and bibasilar atelectasis.   Electronically Signed   By: Rolm Baptise M.D.   On: 09/22/2014 20:49   Dg Chest Port 1 View  09/17/2014   CLINICAL DATA:  Initial evaluation for shortness of breath  EXAM: PORTABLE CHEST - 1 VIEW  COMPARISON:  06/28/2014  FINDINGS: Very limited inspiratory effect. Right lung clear except for mild atelectasis at the base. The retrocardiac left lower lobe is difficult to evaluate. The left upper lobe is clear.  IMPRESSION: Limited study due to very limited inspiratory effect. Retrocardiac left lower lobe difficult to evaluate but left lower lobe infiltrate suspected.   Electronically Signed   By: Skipper Cliche M.D.   On: 09/17/2014 16:30         Subjective: Patient denies fevers, chills, headache, chest pain, dyspnea, nausea, vomiting, diarrhea, abdominal pain, dysuria, hematuria   Objective: Filed Vitals:   09/29/14 1635 09/29/14 1953 09/29/14 2115 09/30/14 0448  BP: 111/62  113/66 122/57  Pulse: 81  89 83  Temp: 99.7 F (37.6 C)  98.9 F (37.2 C) 99 F (37.2 C)  TempSrc: Oral  Oral Oral  Resp: 20  20 18   Height:      Weight:    111.041 kg (244 lb 12.8 oz)  SpO2: 94% 92% 94% 96%    Intake/Output Summary  (Last 24 hours) at 09/30/14 1710 Last data filed at 09/30/14 1224  Gross per 24 hour  Intake   1083 ml  Output   2950 ml  Net  -1867 ml   Weight change: -4.66 kg (-10 lb 4.4 oz) Exam:   General:  Pt is alert, follows commands appropriately, not in acute distress  HEENT: No icterus, No thrush,, [place]/AT  Cardiovascular: RRR, S1/S2, no rubs, no gallops  Respiratory: CTA bilaterally, no wheezing, no crackles, no rhonchi  Abdomen: Soft/+BS, non tender, non distended, no guarding  Extremities: trace RLE edema, No lymphangitis, No petechiae, No rashes, no synovitis  Data Reviewed: Basic Metabolic Panel:  Recent Labs Lab 09/25/14 0355 09/25/14 1830  09/26/14 0735 09/27/14 0345 09/28/14 0536 09/29/14 0430 09/30/14 0455  NA 131* 136  < > 137 135 132* 136 139  K 2.0* 2.6*  < > 2.9* 3.3* 3.6 3.8 3.8  CL 99 102  < > 104 104 100 100 100  CO2 26 25  < > 24 26 25 27 29   GLUCOSE 111* 147*  < > 123* 108* 111* 105* 103*  BUN 18 21  < > 20 17 18 19 19   CREATININE 2.04* 1.99*  < > 2.27* 2.23* 2.23* 1.98* 1.59*  CALCIUM 7.2* 7.6*  < > 7.8* 7.7* 7.6* 8.2* 8.9  MG 1.3* 2.5  --   --  2.0 1.5  --   --   < > = values in this interval not displayed. Liver Function Tests:  Recent Labs Lab 09/25/14 2359 09/30/14 0455  AST 17 13  ALT 12 10  ALKPHOS 49 54  BILITOT 0.6  0.4  PROT 5.8* 6.9  ALBUMIN 3.0* 3.3*   No results for input(s): LIPASE, AMYLASE in the last 168 hours. No results for input(s): AMMONIA in the last 168 hours. CBC:  Recent Labs Lab 09/25/14 2359 09/27/14 0345 09/28/14 0536 09/29/14 0430 09/30/14 0455  WBC 11.3* 12.1* 13.2* 14.8* 13.6*  HGB 8.3* 8.0* 7.6* 7.8* 7.9*  HCT 24.8* 24.8* 23.3* 24.3* 24.7*  MCV 81.0 84.1 84.1 84.7 84.6  PLT 199 211 214 307 377   Cardiac Enzymes: No results for input(s): CKTOTAL, CKMB, CKMBINDEX, TROPONINI in the last 168 hours. BNP: Invalid input(s): POCBNP CBG:  Recent Labs Lab 09/27/14 0803  GLUCAP 106*    Recent Results  (from the past 240 hour(s))  Culture, Urine     Status: None   Collection Time: 09/22/14  9:02 PM  Result Value Ref Range Status   Specimen Description URINE, CATHETERIZED  Final   Special Requests NONE  Final   Colony Count   Final    15,000 COLONIES/ML Performed at [facility]    Culture   Final    ENTEROCOCCUS SPECIES Performed at [facility]    Report Status 09/25/2014 FINAL  Final   Organism ID, Bacteria ENTEROCOCCUS SPECIES  Final      Susceptibility   Enterococcus species - MIC*    AMPICILLIN <=2 SENSITIVE Sensitive     LEVOFLOXACIN >=8 RESISTANT Resistant     NITROFURANTOIN <=16 SENSITIVE Sensitive     VANCOMYCIN 1 SENSITIVE Sensitive     TETRACYCLINE >=16 RESISTANT Resistant     * ENTEROCOCCUS SPECIES  Culture, blood (routine x 2)     Status: None   Collection Time: 09/22/14 11:50 PM  Result Value Ref Range Status   Specimen Description BLOOD LEFT ARM  Final   Special Requests BOTTLES DRAWN AEROBIC AND ANAEROBIC 3CC  Final   Culture   Final    NO GROWTH 5 DAYS Performed at [facility]    Report Status 09/29/2014 FINAL  Final  Culture, blood (routine x 2)     Status: None   Collection Time: 09/22/14 11:56 PM  Result Value Ref Range Status   Specimen Description BLOOD LEFT HAND  Final   Special Requests BOTTLES DRAWN AEROBIC ONLY 2CC  Final   Culture   Final    NO GROWTH 5 DAYS Performed at [facility]    Report Status 09/29/2014 FINAL  Final  Culture, blood (routine x 2)     Status: None (Preliminary result)   Collection Time: 09/29/14  9:45 AM  Result Value Ref Range Status   Specimen Description BLOOD LEFT HAND  Final   Special Requests BOTTLES DRAWN AEROBIC ONLY 3CC  Final   Culture   Final           BLOOD CULTURE RECEIVED NO GROWTH TO DATE CULTURE WILL BE HELD FOR 5 DAYS BEFORE ISSUING A FINAL NEGATIVE REPORT Performed at [facility]    Report Status PENDING  Incomplete  Culture, blood (routine x 2)      Status: None (Preliminary result)   Collection Time: 09/29/14  9:55 AM  Result Value Ref Range Status   Specimen Description BLOOD LEFT ARM  Final   Special Requests BOTTLES DRAWN AEROBIC ONLY 5CC  Final   Culture   Final           BLOOD CULTURE RECEIVED NO GROWTH TO DATE CULTURE WILL BE HELD FOR 5 DAYS BEFORE ISSUING A FINAL NEGATIVE REPORT Performed  at [facility]    Report Status PENDING  Incomplete     Scheduled Meds: . antiseptic oral rinse  7 mL Mouth Rinse BID  . atorvastatin  10 mg Oral QHS  . docusate sodium  100 mg Oral BID  . feeding supplement (ENSURE COMPLETE)  237 mL Oral BID BM  . fentaNYL  25 mcg Transdermal Q72H  . lamoTRIgine  200 mg Oral BID  . levothyroxine  125 mcg Oral QAC breakfast  . metoprolol tartrate  25 mg Oral BID  . oxybutynin  5 mg Oral Daily  . pantoprazole  40 mg Oral BID AC  . predniSONE  10 mg Oral Q breakfast  . pregabalin  75 mg Oral TID  . QUEtiapine  100 mg Oral BID  . saccharomyces boulardii  250 mg Oral BID  . senna  2 tablet Oral Daily  . sodium chloride  3 mL Intravenous Q12H  . Warfarin - Pharmacist Dosing Inpatient   Does not apply q1800   Continuous Infusions:    Londan Coplen, DO  [facility] Pager [phone]  If 7PM-7AM, please contact night-coverage [email or web address] Password [email or web address] 09/30/2014, 5:10 PM   LOS: 13 days

## 2014-09-30 NOTE — Progress Notes (Signed)
ANTICOAGULATION CONSULT NOTE - Follow up  Pharmacy Consult for Warfarin Indication: Hx PE  Allergies  Allergen Reactions  . Tomato Other (See Comments)    Unknown reaction    Patient Measurements: Height: 5\' 5"  (165.1 cm) Weight: 244 lb 12.8 oz (111.041 kg) IBW/kg (Calculated) : 61.5  Vital Signs: Temp: 99 F (37.2 C) (03/05 0448) Temp Source: Oral (03/05 0448) BP: 122/57 mmHg (03/05 0448) Pulse Rate: 83 (03/05 0448)  Labs:  Recent Labs  09/28/14 0536 09/29/14 0430 09/30/14 0455  HGB 7.6* 7.8* 7.9*  HCT 23.3* 24.3* 24.7*  PLT 214 307 377  LABPROT 14.7 14.4 14.9  INR 1.14 1.11 1.15  CREATININE 2.23* 1.98* 1.59*    Estimated Creatinine Clearance: 58.9 mL/min (by C-G formula based on Cr of 1.59).  Assessment: 31 yoM presented [facility] ED from SNF on 2/21 with cough,SOB, and concern for HCAP or UTI.  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  PTA warfarin dose was 4mg  daily except 4.5mg  on Tues/Thurs/Sun.  INR was therapeutic on admission.  Pharmacy was initially consulted to dose warfarin, but was changed to heparin perioperatively (see events below).  Dr. Carles Collet and Urology agree that warfarin can now be safely resumed post-op.  Significant events: 2/21 - 2/25 continued warfarin inpatient 2/26 Given Vitamin K for supratherapeutic INR 2/27 Transitioned to heparin perioperatively 2/29 HLs previously therapeutic, but heparin now held d/t hematuria and upcoming procedure.  Transferred to [facility]. 3/1 S/p cystoscopy w/ bilateral ureteral stent placement.  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 3/3 Resumed warfarin  Today, 09/30/2014:  INR still subtherapeutic, not rising yet after 2 doses of 4mg  of Coumadin.  CBC: Hgb low but stable from yesterday. Pltc remain wnl.  Bleeding: urine clear per urology notes (and expected per urology)  Diet:  regular  Drug-drug interactions: no major interactions noted  Goal of Therapy:  INR 2-3 Monitor platelets by anticoagulation protocol: Yes   Plan:   Warfarin 5 mg PO x1 dose today at 1800.  Daily INR  Follow up CBC and hematuria closely.   Adrian Saran, PharmD, BCPS Pager [phone] 09/30/2014 10:45 AM

## 2014-09-30 NOTE — Progress Notes (Signed)
4 Days Post-Op Subjective: Patient reports no complaints. Specifically no flank pain or suprapubic discomfort.  Objective: Vital signs in last 24 hours: Temp:  [98.9 F (37.2 C)-99.7 F (37.6 C)] 99 F (37.2 C) (03/05 0448) Pulse Rate:  [81-89] 83 (03/05 0448) Resp:  [18-20] 18 (03/05 0448) BP: (111-122)/(57-66) 122/57 mmHg (03/05 0448) SpO2:  [92 %-96 %] 96 % (03/05 0448) Weight:  [111.041 kg (244 lb 12.8 oz)] 111.041 kg (244 lb 12.8 oz) (03/05 0448)  Intake/Output from previous day: 03/04 0701 - 03/05 0700 In: 1440 [P.O.:1440] Out: 4000 [Urine:4000] Intake/Output this shift:    Physical Exam:  His abdomen is obese and nontender. No CVAT.  Lab Results:  Recent Labs  09/28/14 0536 09/29/14 0430 09/30/14 0455  HGB 7.6* 7.8* 7.9*  HCT 23.3* 24.3* 24.7*   BMET  Recent Labs  09/29/14 0430 09/30/14 0455  NA 136 139  K 3.8 3.8  CL 100 100  CO2 27 29  GLUCOSE 105* 103*  BUN 19 19  CREATININE 1.98* 1.59*  CALCIUM 8.2* 8.9    Recent Labs  09/28/14 0536 09/29/14 0430 09/30/14 0455  INR 1.14 1.11 1.15   No results for input(s): LABURIN in the last 72 hours. Results for orders placed or performed during the hospital encounter of 09/17/14  Urine culture     Status: None   Collection Time: 09/17/14  4:29 PM  Result Value Ref Range Status   Specimen Description URINE, CATHETERIZED  Final   Special Requests NONE  Final   Colony Count >=100,000 COLONIES/ML  Final   Culture   Final    ACINETOBACTER CALCOACETICUS/BAUMANNII COMPLEX KLEBSIELLA PNEUMONIAE Note: Confirmed Extended Spectrum Beta-Lactamase Producer (ESBL) CRITICAL RESULT CALLED TO, READ BACK BY AND VERIFIED WITH: Carin Hock RN on 09/22/14 at 02:05 by Rise Mu    Report Status 09/22/2014 FINAL  Final   Organism ID, Bacteria ACINETOBACTER CALCOACETICUS/BAUMANNII COMPLEX  Final   Organism ID, Bacteria KLEBSIELLA PNEUMONIAE  Final      Susceptibility   Klebsiella pneumoniae - MIC*    AMPICILLIN >=32  RESISTANT Resistant     CEFAZOLIN >=64 RESISTANT Resistant     CEFTRIAXONE >=64 RESISTANT Resistant     CIPROFLOXACIN >=4 RESISTANT Resistant     GENTAMICIN >=16 RESISTANT Resistant     LEVOFLOXACIN >=8 RESISTANT Resistant     NITROFURANTOIN 256 RESISTANT Resistant     TOBRAMYCIN 2 SENSITIVE Sensitive     TRIMETH/SULFA >=320 RESISTANT Resistant     IMIPENEM <=0.25 SENSITIVE Sensitive     PIP/TAZO >=128 RESISTANT Resistant     * KLEBSIELLA PNEUMONIAE   Acinetobacter calcoaceticus/baumannii complex - MIC*    CEFTRIAXONE Value in next row Resistant      >=64 [facility]ANTPerformed at [facility]    CIPROFLOXACIN Value in next row Resistant      >=4 RESISTANTPerformed at [facility]    GENTAMICIN Value in next row Sensitive      <=1 SENSITIVEPerformed at [facility]    PIP/TAZO Value in next row Resistant      >=128 RESISTANTPerformed at [facility]    TOBRAMYCIN Value in next row Sensitive      <=1 SENSITIVEPerformed at [facility]    TRIMETH/SULFA Value in next row Resistant      >=320 RESISTANTPerformed at [facility]    LEVOFLOXACIN Value in next row Resistant      >=8 RESISTANTPerformed at [facility]    NITROFURANTOIN Value in next row Resistant      >=  512 RESISTANTPerformed at [facility]    IMIPENEM Value in next row Resistant      >=16 RESISTANTPerformed at [place]  Blood Culture (routine x 2)     Status: None   Collection Time: 09/17/14  4:40 PM  Result Value Ref Range Status   Specimen Description BLOOD RIGHT ARM  Final   Special Requests BOTTLES DRAWN AEROBIC AND ANAEROBIC 10CC  Final   Culture   Final    NO GROWTH 5 DAYS Performed at [facility]    Report Status 09/24/2014 FINAL  Final  Blood Culture (routine x 2)     Status: None   Collection Time: 09/17/14  4:55 PM  Result Value Ref Range Status   Specimen Description  BLOOD LEFT HAND  Final   Special Requests BOTTLES DRAWN AEROBIC ONLY 5CC  Final   Culture   Final    NO GROWTH 5 DAYS Performed at [facility]    Report Status 09/24/2014 FINAL  Final  MRSA PCR Screening     Status: Abnormal   Collection Time: 09/17/14  8:10 PM  Result Value Ref Range Status   MRSA by PCR POSITIVE (A) NEGATIVE Final    Comment:        The GeneXpert MRSA Assay (FDA approved for NASAL specimens only), is one component of a comprehensive MRSA colonization surveillance program. It is not intended to diagnose MRSA infection nor to guide or monitor treatment for MRSA infections. RESULT CALLED TO, READ BACK BY AND VERIFIED WITH: PETTIFORD,A RN [phone] 09/18/14 MITCHELL,L   Culture, Urine     Status: None   Collection Time: 09/22/14  9:02 PM  Result Value Ref Range Status   Specimen Description URINE, CATHETERIZED  Final   Special Requests NONE  Final   Colony Count   Final    15,000 COLONIES/ML Performed at [facility]    Culture   Final    ENTEROCOCCUS SPECIES Performed at [facility]    Report Status 09/25/2014 FINAL  Final   Organism ID, Bacteria ENTEROCOCCUS SPECIES  Final      Susceptibility   Enterococcus species - MIC*    AMPICILLIN <=2 SENSITIVE Sensitive     LEVOFLOXACIN >=8 RESISTANT Resistant     NITROFURANTOIN <=16 SENSITIVE Sensitive     VANCOMYCIN 1 SENSITIVE Sensitive     TETRACYCLINE >=16 RESISTANT Resistant     * ENTEROCOCCUS SPECIES  Culture, blood (routine x 2)     Status: None   Collection Time: 09/22/14 11:50 PM  Result Value Ref Range Status   Specimen Description BLOOD LEFT ARM  Final   Special Requests BOTTLES DRAWN AEROBIC AND ANAEROBIC 3CC  Final   Culture   Final    NO GROWTH 5 DAYS Performed at [facility]    Report Status 09/29/2014 FINAL  Final  Culture, blood (routine x 2)     Status: None   Collection Time: 09/22/14 11:56 PM  Result Value Ref Range Status   Specimen Description BLOOD  LEFT HAND  Final   Special Requests BOTTLES DRAWN AEROBIC ONLY 2CC  Final   Culture   Final    NO GROWTH 5 DAYS Performed at [facility]    Report Status 09/29/2014 FINAL  Final    Studies/Results: US Renal  09/29/2014   CLINICAL DATA:  Acute renal failure, hematuria  EXAM: RENAL/URINARY TRACT ULTRASOUND COMPLETE  COMPARISON:  09/25/2014  FINDINGS:  Right Kidney:  Length: 11.3 cm. Echogenicity within normal limits. No mass or hydronephrosis visualized.  Left Kidney:  Length: 12.4 cm. Echogenicity within normal limits. No mass or hydronephrosis visualized.  Bladder:  Decompressed by Foley catheter.  IMPRESSION: No acute abnormality is noted. The previously seen hydronephrosis has resolved in the interval.   Electronically Signed   By: Inez Catalina M.D.   On: 09/29/2014 15:57   Dg Chest Port 1 View  09/28/2014   CLINICAL DATA:  PICC placement  EXAM: PORTABLE CHEST - 1 VIEW  COMPARISON:  09/22/2014  FINDINGS: Right PICC tip projects in the lower superior vena cava.  Lung volumes are low as that were on the prior study. There is persistent lung base atelectasis. No convincing pneumonia. No pulmonary edema. No pneumothorax.  IMPRESSION: Right PICC line tip projects in the lower superior vena cava. No convincing pneumonia. No pulmonary edema. Persistent basilar atelectasis.   Electronically Signed   By: Lajean Manes M.D.   On: 09/28/2014 13:37    Assessment/Plan: His urine remains clear despite initiation of coumadinization.  Continue Foley catheter drainage.   LOS: 13 days   Glenard Keesling C 09/30/2014, 7:05 AM

## 2014-10-01 LAB — BASIC METABOLIC PANEL
Anion gap: 12 (ref 5–15)
BUN: 18 mg/dL (ref 6–23)
CALCIUM: 9.3 mg/dL (ref 8.4–10.5)
CO2: 27 mmol/L (ref 19–32)
Chloride: 100 mmol/L (ref 96–112)
Creatinine, Ser: 1.64 mg/dL — ABNORMAL HIGH (ref 0.50–1.35)
GFR calc Af Amer: 52 mL/min — ABNORMAL LOW (ref >90)
GFR calc non Af Amer: 45 mL/min — ABNORMAL LOW (ref >90)
GLUCOSE: 99 mg/dL (ref 70–99)
Potassium: 3.8 mmol/L (ref 3.5–5.1)
SODIUM: 139 mmol/L (ref 135–145)

## 2014-10-01 LAB — CBC
HEMATOCRIT: 25.8 % — AB (ref 39.0–52.0)
Hemoglobin: 8.2 g/dL — ABNORMAL LOW (ref 13.0–17.0)
MCH: 26.6 pg (ref 26.0–34.0)
MCHC: 31.8 g/dL (ref 30.0–36.0)
MCV: 83.8 fL (ref 78.0–100.0)
PLATELETS: 459 10*3/uL — AB (ref 150–400)
RBC: 3.08 MIL/uL — ABNORMAL LOW (ref 4.22–5.81)
RDW: 15.4 % (ref 11.5–15.5)
WBC: 10.9 10*3/uL — ABNORMAL HIGH (ref 4.0–10.5)

## 2014-10-01 LAB — PROTIME-INR
INR: 1.27 (ref 0.00–1.49)
Prothrombin Time: 16 seconds — ABNORMAL HIGH (ref 11.6–15.2)

## 2014-10-01 MED ORDER — FENTANYL 25 MCG/HR TD PT72: 25.0000 ug | MEDICATED_PATCH | TRANSDERMAL | Status: DC

## 2014-10-01 MED ORDER — CLONAZEPAM 1 MG PO TABS: 1.0000 mg | ORAL_TABLET | Freq: Two times a day (BID) | ORAL | Status: DC | PRN

## 2014-10-01 MED ORDER — CLONAZEPAM 0.5 MG PO TABS
ORAL_TABLET | ORAL | Status: DC
Start: 2014-10-01 — End: 2014-10-01

## 2014-10-01 MED ORDER — OXYCODONE HCL 15 MG PO TABS: 15.0000 mg | ORAL_TABLET | Freq: Three times a day (TID) | ORAL | Status: DC | PRN

## 2014-10-01 MED ORDER — HYDROCODONE-ACETAMINOPHEN 5-325 MG PO TABS: 1.0000 | ORAL_TABLET | Freq: Four times a day (QID) | ORAL | Status: DC | PRN

## 2014-10-01 MED ORDER — CLONAZEPAM 0.5 MG PO TABS: 0.5000 mg | ORAL_TABLET | Freq: Three times a day (TID) | ORAL | Status: DC | PRN

## 2014-10-01 NOTE — Progress Notes (Signed)
Report called to Maudie Mercury, RN at [facility]. EMS at bedside to transport pt. Pt stable.   Othella Boyer [facility]

## 2014-10-01 NOTE — Discharge Summary (Addendum)
Physician Discharge Summary  Samuel [name] KGM:[id number] DOB: [birth year] DOA: 09/17/2014  PCP: [name], MD  Admit date: 09/17/2014 Discharge date: 10/01/2014  Recommendations for Outpatient Follow-up:  1. Pt will need to follow up with PCP in 2 weeks post discharge 2. Please obtain BMP and CBC in one week 3. Please check INR on 10/02/14 and adjust coumadin dose accordingly for INR 2-3  Discharge Diagnoses:  Hematuria/bilateral hydronephrosis due to bladder wall thickening/Urothelial Carcinoma of Bladder -Patient on chronic anticoagulation presented with hematuria initially felt to be secondary to recent Foley catheter exchange, but likely due to bladder tumor -Renal ultrasound showed the presence of bilateral hydronephrosis, CT scan of the abdomen and pelvis showed moderate to severe left hydronephrosis with bladder wall thickening and possible tumor.  - 3/1--TURBT for bladder tumor and ureteral stent 3/1 at [facility] Dr. [name] -still having a little hematuria in foley bag, but hemoglobin remains stable -restarted coumadin without bridging on 09/28/14 -09/29/14--WBC increasing with tachycardia-->UA and urine culture and blood culture-->Dr. 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 [name] discussed with patient -09/29/2014 renal ultrasound--resolution of bilateral hydronephrosis -09/29/2014 blood cultures negative to date 09/29/14--urine culture--neg -remains clinically stable off of antibiotics -10/01/2014 WBC 10.9  History of pulmonary embolism after surgery for left hip fracture in Feb 2015. -had massive PE with hypotension and R-heart strain Feb 2015 -CCM recommended lifelong anticoagulation due to massive PE and pt's immobility from his paraplegia - Held Coumadin for urological procedure--I>V heparin drip--now off post surgery - Patient had IVC filter placement in 09/22/2013 for  large PE but subsequently was removed -09/28/2014--Coumadin restarted after it was discontinued at the beginning of the hospitalization -Hgb stable with small amounts hematuria -continue coumadin-check INR on 10/02/2014 and adjust Coumadin dosing accordingly for INR 2-3.  Acute encephalopathy likely due to seizure - Appears to be improved. Awake and alert.  - workup so far negative, CT head negative, EEG did not show any epileptiform activity. - meropenem and narcotics were discontinued - neurology was consulted. Patient was initially placed on Depakote. Now patient is on Lamictal. Depakote was discontinued.  Seizure episode - No further seizures. Now on Lamictal and Lyrica. Neurology has been following - EEG does not reveal any epileptiform activity. No other abnormalities noted.   Healthcare associated pneumonia/ left lower lobe infiltrates -All antibiotics discontinued following acute encephalopathy/seizure, - Patient has received appropriate course of meropenem for HCAP, has urinary colonization with drug-resistant Klebsiella and Acinetobacter.  -Blood cultures negative   Klebsiella and Acinetobacter UTI -likely due to chronic indwelling foley catheter - Patient has a history of chronic Foley catheter due to neurogenic bladder,has chronic colonization. - Meropenem has been discontinued. ID, Dr. [name] recommended pharmacy to give preoperative dose of tobramycin before his upcoming urological procedure.  Acute kidney injury with bilateral hydronephrosis. -Renal function is stable. Nephrology had been following.  -Potassium was repeated. -creatinine slowly improving--serum creatinine 1.64 on day of discharge -baseline creatinine 0.7-1.0  Hypokalemia, hypomagnesemia - This was being aggressively repleted.   Suicide ideation  During this hospitalization patient mentioned that he wanted to 'kill himself and get out of this mess', one-to-one sitter was placed.  -Psychiatry  cleared pt for d/c  Essential hypertension Continue metoprolol tartrate--converted to po--patient intermittently refused  Acute blood loss anemia due to hematuria Continue to monitor. Transfuse as needed. -also a dilutional component  Chronic pain -restarted at lower dose (25 mcg) -oxycodone IR for pain -DC intravenous Dilaudid -restart Klonopin  for anxiety  Discharge Condition: stable  Disposition: SNF Follow-up Information    Follow up with ESKRIDGE, MATTHEW, MD In 2 weeks.   Specialty:  Urology   Contact information:   [address] [place] [address] [phone]       Diet:regular Wt Readings from Last 3 Encounters:  10/01/14 109.362 kg (241 lb 1.6 oz)  07/03/14 115.939 kg (255 lb 9.6 oz)  01/13/14 108.863 kg (240 lb)    History of present illness:  Patient is a pleasant 59 year old male with past medical history paraplegia, neurogenic bladder, chronic indwelling Foley catheter, status post left lower extremity amputation, presented as a transfer from his skilled nursing facility to the emergency department on 09/17/2014 with complaints of increasing cough and shortness of breath. Workup included a chest x-ray which revealed left lower lobe infiltrate. Initial lab showed white count of 13,600 which trended up to 20,000 on the following day. He was started on broad-spectrum IV antimicrobial therapy with meropenem and vancomycin for presumed healthcare associated pneumonia. Patient was also found to have acute kidney injury presenting with a creatinine 1.92. Previous lab work on 08/25/2014 showed a creatinine 1.0. He was started on IV fluids. Renal ultrasound showed the presence of bilateral hydronephrosis. Patient was continuing to have hematuria. Urine cultures showed Acinetobacter and Klebsiella.  2/27 patient noted to be acutely encephalopathic, not interacting at all, jerky movements. Overnight had a BP of 207/106,was transferred to stepdown unit. In SDU, patient had  a generalized tonic-clonic seizure and patient was placed on Depakote by neurology. Neurology also felt that patient most likely has metabolic encephalopathy secondary to meropenem and medication effect. All the narcotics including fentanyl patch and antibiotics were discontinued. Patient was noted to have postictal phase, polyuria and rising creatinine. Patient is being closely followed by neurology ( Dr. Doy Mince), ID (Dr. [name]), urology ( Dr. [name]), and nephrology, Dr. Melvia Heaps.  2/29 patient is alert and oriented, interactive , back to his baseline, creatinine function has been improving. Dr. [name], from urology service performed TURBT bladder tumor and ureteral stent at [facility] on 3/1 AM. The patient remained clinically stable after the operation. His renal function remained elevated but stable. The patient continued to have small amount of hematuria. The patient was restarted on warfarin after surgery on 09/28/14. He remained hemodynamically stable without any worsening hematuria. The patient will be discharged back to his facility with warfarin without bridging. INR should be checked on 10/02/2014 and warfarin dose should be adjusted accordingly for INR of 2-3.  Consultants: urology  Discharge Exam: Filed Vitals:   10/01/14 0530  BP: 156/85  Pulse: 87  Temp: 97.8 F (36.6 C)  Resp: 20   Filed Vitals:   09/30/14 0448 09/30/14 1300 09/30/14 2039 10/01/14 0530  BP: 122/57 94/60 158/91 156/85  Pulse: 83 77 89 87  Temp: 99 F (37.2 C) 98 F (36.7 C) 98.2 F (36.8 C) 97.8 F (36.6 C)  TempSrc: Oral Oral Oral Oral  Resp: 18 18 18 20   Height:      Weight: 111.041 kg (244 lb 12.8 oz)   109.362 kg (241 lb 1.6 oz)  SpO2: 96% 94% 93% 95%   General: A&O x 3, NAD, pleasant, cooperative Cardiovascular: RRR, no rub, no gallop, no S3 Respiratory: CTAB, no wheeze, no rhonchi Abdomen:soft, nontender, nondistended, positive bowel sounds Extremities: 1+RLE edema, No lymphangitis, no  petechiae  Discharge Instructions      Discharge Instructions    Diet - low sodium heart healthy  Complete by:  As directed      Increase activity slowly    Complete by:  As directed             Medication List    STOP taking these medications        amLODipine 5 MG tablet  Commonly known as:  NORVASC     amoxicillin-clavulanate 875-125 MG per tablet  Commonly known as:  AUGMENTIN     cephALEXin 250 MG capsule  Commonly known as:  KEFLEX     fentaNYL 50 MCG/HR  Commonly known as:  DURAGESIC - dosed mcg/hr     fentaNYL 75 MCG/HR  Commonly known as:  DURAGESIC - dosed mcg/hr  Replaced by:  fentaNYL 25 MCG/HR patch     LORazepam 0.5 MG tablet  Commonly known as:  ATIVAN     LORazepam 2 MG/ML injection  Commonly known as:  ATIVAN     phenol 1.4 % Liqd  Commonly known as:  CHLORASEPTIC     saccharomyces boulardii 250 MG capsule  Commonly known as:  FLORASTOR     senna-docusate 8.6-50 MG per tablet  Commonly known as:  Senokot-S      TAKE these medications        acetaminophen 325 MG tablet  Commonly known as:  TYLENOL  Take 650 mg by mouth every 6 (six) hours as needed for moderate pain.     alum & mag hydroxide-simeth 200-200-20 MG/5ML suspension  Commonly known as:  MAALOX/MYLANTA  Take 20 mLs by mouth 2 (two) times daily as needed for indigestion or heartburn.     atorvastatin 10 MG tablet  Commonly known as:  LIPITOR  Take 10 mg by mouth at bedtime.     baclofen 20 MG tablet  Commonly known as:  LIORESAL  Take 20 mg by mouth 3 (three) times daily. 9am, 2pm, 9pm     calcium carbonate 1250 (500 CA) MG tablet  Commonly known as:  OS-CAL - dosed in mg of elemental calcium  Take 1 tablet by mouth daily with breakfast.     calcium carbonate 500 MG chewable tablet  Commonly known as:  TUMS - dosed in mg elemental calcium  Chew 4 tablets by mouth every 4 (four) hours as needed for indigestion or heartburn.     chlorpheniramine 4 MG tablet    Commonly known as:  CHLOR-TRIMETON  Take 8 mg by mouth at bedtime as needed for allergies (cough).     clonazePAM 0.5 MG tablet  Commonly known as:  KLONOPIN  Take one tablet by mouth three times daily for anxiety     clonazePAM 1 MG tablet  Commonly known as:  KLONOPIN  Take 1 tablet (1 mg total) by mouth every 12 (twelve) hours as needed for anxiety. Also takes 0.5 mg 3 times daily     DEXILANT 60 MG capsule  Generic drug:  dexlansoprazole  Take 60 mg by mouth 2 (two) times daily.     docusate sodium 100 MG capsule  Commonly known as:  COLACE  Take 1 capsule (100 mg total) by mouth 2 (two) times daily.     DULoxetine 60 MG capsule  Commonly known as:  CYMBALTA  Take 60 mg by mouth at bedtime.     DUONEB 0.5-2.5 (3) MG/3ML Soln  Generic drug:  ipratropium-albuterol  Take 3 mLs by nebulization See admin instructions. Inhale 1 vial 4 times daily for lung disease; may also take every 4 hours as needed for wheezing/  congestion     famotidine 20 MG tablet  Commonly known as:  PEPCID  Take 20 mg by mouth at bedtime.     feeding supplement (ENSURE COMPLETE) Liqd  Take 237 mLs by mouth 2 (two) times daily between meals.     fentaNYL 25 MCG/HR patch  Commonly known as:  DURAGESIC - dosed mcg/hr  Place 1 patch (25 mcg total) onto the skin every 3 (three) days.     ferrous sulfate 325 (65 FE) MG tablet  Take 325 mg by mouth 2 (two) times daily with a meal.     folic acid 1 MG tablet  Commonly known as:  FOLVITE  Take 1 mg by mouth daily.     GENERLAC 10 GM/15ML Soln  Generic drug:  lactulose (encephalopathy)  Take 20 g by mouth 4 (four) times daily. 9am, 1pm, 5pm, 9pm     guaiFENesin 100 MG/5ML liquid  Commonly known as:  ROBITUSSIN  Take 300 mg by mouth 3 (three) times daily as needed for cough.     HYDROcodone-acetaminophen 5-325 MG per tablet  Commonly known as:  NORCO/VICODIN  Take 1-2 tablets by mouth every 6 (six) hours as needed for moderate pain or severe pain.      lamoTRIgine 200 MG tablet  Commonly known as:  LAMICTAL  Take 200 mg by mouth 2 (two) times daily. To stabilize bipolar mood     levothyroxine 125 MCG tablet  Commonly known as:  SYNTHROID, LEVOTHROID  Take 125 mcg by mouth daily before breakfast.     meclizine 25 MG tablet  Commonly known as:  ANTIVERT  Take 1 tablet (25 mg total) by mouth 3 (three) times daily as needed for dizziness.     metoCLOPramide 5 MG tablet  Commonly known as:  REGLAN  Take 5 mg by mouth 4 (four) times daily -  before meals and at bedtime.     metoprolol tartrate 25 MG tablet  Commonly known as:  LOPRESSOR  Take 1 tablet (25 mg total) by mouth 2 (two) times daily.     nitroGLYCERIN 0.4 MG SL tablet  Commonly known as:  NITROSTAT  Place 0.4 mg under the tongue every 5 (five) minutes as needed for chest pain. Up to 3 doses     ondansetron 4 MG tablet  Commonly known as:  ZOFRAN  Take 4 mg by mouth 3 (three) times daily as needed for nausea or vomiting.     oxybutynin 5 MG 24 hr tablet  Commonly known as:  DITROPAN-XL  Take 5 mg by mouth daily.     oxyCODONE 15 MG immediate release tablet  Commonly known as:  ROXICODONE  Take 1 tablet (15 mg total) by mouth 3 (three) times daily as needed (moderate to severe pain).     potassium chloride 10 MEQ CR capsule  Commonly known as:  MICRO-K  Take 10 mEq by mouth daily.     predniSONE 10 MG tablet  Commonly known as:  DELTASONE  Take 10 mg by mouth daily with breakfast.     pregabalin 75 MG capsule  Commonly known as:  LYRICA  Take one capsule by mouth twice daily for pain     promethazine 12.5 MG tablet  Commonly known as:  PHENERGAN  Take 12.5 mg by mouth every 6 (six) hours as needed for nausea or vomiting.     QUEtiapine 200 MG tablet  Commonly known as:  SEROQUEL  Take 200 mg by mouth 2 (two) times daily.  senna 8.6 MG Tabs tablet  Commonly known as:  SENOKOT  Take 2 tablets (17.2 mg total) by mouth daily.     SYSTANE OP   Place 2 drops into both eyes 3 (three) times daily. 8am, 2pm, 8pm     vitamin B-12 1000 MCG tablet  Commonly known as:  CYANOCOBALAMIN  Take 1,000 mcg by mouth every other day.     Vitamin D (Ergocalciferol) 50000 UNITS Caps capsule  Commonly known as:  DRISDOL  Take 50,000 Units by mouth every 30 (thirty) days. Takes on the 11th of the month     warfarin 4 MG tablet  Commonly known as:  COUMADIN  Take 4 mg by mouth See admin instructions. Take 1 tablet (4 mg) Monday, Wednesday, Friday, Saturday at 5pm (take 4.5 mg on Tuesday, Thursday and Sunday)     WARFARIN SODIUM PO  Take 4.5 mg by mouth See admin instructions. Take 4.5 mg on Tuesday, Thursday, Sunday at 5pm, (take 4 mg on Monday, Wednesday, Friday and Saturday)         The results of significant diagnostics from this hospitalization (including imaging, microbiology, ancillary and laboratory) are listed below for reference.    Significant Diagnostic Studies: Ct Abdomen Pelvis Wo Contrast  09/25/2014   CLINICAL DATA:  Hydronephrosis  EXAM: CT ABDOMEN AND PELVIS WITHOUT CONTRAST  TECHNIQUE: Multidetector CT imaging of the abdomen and pelvis was performed following the standard protocol without IV contrast.  COMPARISON:  09/19/2014  FINDINGS: BODY WALL: Symmetric gynecomastia  LOWER CHEST: Decreasing airspace disease in the right lower lobe. Clustered centrilobular nodules in the right middle lobe are unchanged and could be chronic.  Small hiatal hernia.  ABDOMEN/PELVIS:  Liver: Hepatic steatosis.  Biliary: Distended gallbladder with calcified gallstones. There is no evidence of acute inflammation.  Pancreas: Unremarkable.  Spleen: Unremarkable.  Adrenals: Unremarkable.  Kidneys and ureters: Moderate bilateral hydroureteronephrosis, despite decompressed bladder. Nonobstructive stones seen in the left lower pole, up to 5 mm.  Bladder: Irregular thickening of the decompressed bladder, suspicious for tumor. A Foley catheter remains in good  position.  Reproductive: Unremarkable.  Bowel: Descending colostomy with unchanged tissue in the low pelvis. Prominent volume of formed stool throughout the colon. No evidence of bowel obstruction. No inflammatory bowel wall thickening.  Retroperitoneum: Re- demonstrated right external iliac chain lymphadenopathy, including a 27 x 42 mm mass. No periaortic lymphadenopathy.  Peritoneum: No ascites or pneumoperitoneum.  Vascular: No acute abnormality.  OSSEOUS: Sacrificed spinal catheter fragments noted. There is extensive spinal fusion and sacral truncation. The patient is status post left hip disarticulation and right intertrochanteric femur fracture ORIF. 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  IMPRESSION: 1. 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 2. Right pelvic adenopathy compatible with metastatic disease. 3. Improving pneumonia in the right lower lobe. 4. Chronic findings are stable from prior and noted above.   Electronically Signed   By: [name] M.D.   On: 09/25/2014 05:05   Ct Abdomen Pelvis Wo Contrast  09/19/2014   CLINICAL DATA:  59 year old male with acute kidney injury, bilateral hydronephrosis on recent renal ultrasound, hematuria and elevated white count. 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  EXAM: CT ABDOMEN AND PELVIS WITHOUT CONTRAST  TECHNIQUE: Multidetector CT imaging of the abdomen and pelvis was performed following the standard protocol without IV contrast.  COMPARISON:  09/18/2014 ultrasound. 08/30/2014 abdominal-pelvic CT from [facility]. 07/05/2014 chest CT.  FINDINGS: Please note that parenchymal abnormalities may be missed without intravenous contrast.  Lower chest: Right basilar airspace disease and atelectasis have increased since 08/30/2014. Increasing left basilar atelectasis is noted. There are tiny bilateral pleural effusions present.  Hepatobiliary: No hepatic abnormalities are identified. Cholelithiasis  again noted without CT evidence of acute cholecystitis. There is no evidence of biliary dilatation.  Pancreas: Unremarkable  Spleen: Unremarkable  Adrenals/Urinary Tract: Moderate to severe bilateral hydronephrosis to the bladder again noted, stable on the right and increased on the left since 08/30/2014. A Foley catheter within a collapsed bladder is noted but again noted is abnormal increased density within the bladder versus bladder wall thickening, left-greater-than-right.  A nonobstructing 6 mm mid left renal calculus is noted.  The adrenal glands are unremarkable.  Stomach/Bowel: Prior PEG tube changes again identified. There is no evidence of bowel obstruction or pneumoperitoneum. Left colostomy changes again noted.  Vascular/Lymphatic: Enlarged iliac lymph nodes are unchanged with an index right external iliac lymph node again measuring 2.8 cm (image 64). No new enlarged lymph nodes are identified.  Reproductive: The prostate gland is enlarged.  Other: No evidence of free fluid.  Musculoskeletal: Chronic left hemipelvis bony deformities are again noted including absence of the proximal left femur, chronic right femoral head destruction, flattening and dysplasia of both acetabula, probable right hip effusion and lower sacral/ coccygeal deformity. Prior lumbar spine fusion, thoracolumbar posterior decompression and probable intraspinal pain catheter again noted.  IMPRESSION: Unchanged abnormal increased density within the bladder/bladder wall thickening suspicious for tumor. Moderate to severe bilateral hydronephrosis again noted, stable on the right and increased on the left since 08/30/2014, likely related to tumor obstruction.  Unchanged iliac lymphadenopathy, worrisome for metastatic disease.  Increased airspace disease and atelectasis at the right lung base with aspiration or pneumonia considerations. Increased left basilar atelectasis and new tiny bilateral pleural effusions.  Chronic bony changes within  the pelvis.  Cholelithiasis without CT evidence of acute cholecystitis.  Prostatomegaly.   Electronically Signed   By: Margarette Canada M.D.   On: 09/19/2014 11:25   Dg Chest 2 View  09/20/2014   CLINICAL DATA:  Pneumonia.  EXAM: CHEST  2 VIEW  COMPARISON:  09/17/2014  FINDINGS: Right PICC line is in place with the tip in the SVC. Very low lung volumes. Elevation of the right hemidiaphragm. Limited evaluation due to the low volumes. There is cardiomegaly. No acute bony abnormality.  IMPRESSION: Limited study due to very low lung volumes. Findings similar to prior study.  Right PICC line tip in the SVC.   Electronically Signed   By: Rolm Baptise M.D.   On: 09/20/2014 12:06   Ct Head Wo Contrast  09/23/2014   CLINICAL DATA:  Took Ativan to sleep last night. Not responding this morning. Altered mental status.  EXAM: CT HEAD WITHOUT CONTRAST  TECHNIQUE: Contiguous axial images were obtained from the base of the skull through the vertex without intravenous contrast.  COMPARISON:  12/07/2013  FINDINGS: Patient was uncooperative for the study which had to be repeated several times due to motion. No visible acute intracranial abnormality. No visible acute infarction. No hemorrhage or hydrocephalus. No extra-axial fluid collection. Paranasal sinuses and mastoids are clear. No acute calvarial abnormality.  IMPRESSION: Typical, somewhat limited study due to patient motion. Study was repeated several times. No visible acute intracranial abnormality. With the multiple attempts, the study is felt to be diagnostic.   Electronically Signed   By: Rolm Baptise M.D.   On: 09/23/2014 11:12   US Renal  09/29/2014   CLINICAL DATA:  Acute  renal failure, hematuria  EXAM: RENAL/URINARY TRACT ULTRASOUND COMPLETE  COMPARISON:  09/25/2014  FINDINGS: Right Kidney:  Length: 11.3 cm. Echogenicity within normal limits. No mass or hydronephrosis visualized.  Left Kidney:  Length: 12.4 cm. Echogenicity within normal limits. No mass or  hydronephrosis visualized.  Bladder:  Decompressed by Foley catheter.  IMPRESSION: No acute abnormality is noted. The previously seen hydronephrosis has resolved in the interval.   Electronically Signed   By: Inez Catalina M.D.   On: 09/29/2014 15:57   US Renal  09/18/2014   CLINICAL DATA:  Acute renal failure  EXAM: RENAL/URINARY TRACT ULTRASOUND COMPLETE  COMPARISON:  CT, 08/30/2014  FINDINGS: Right Kidney:  Length: 13.2 cm. Normal parenchymal echogenicity. No mass or stone. Moderate hydronephrosis.  Left Kidney:  Length: 13.2 cm. Normal parenchymal echogenicity. No mass or stone. Mild to moderate hydronephrosis.  Bladder:  Decompressed by a Foley catheter.  IMPRESSION: 1. Bilateral hydronephrosis, moderate on the right and mild to moderate on the left. Hydronephrosis on the left appears increased when compared to the prior CT. That on the right is similar.   Electronically Signed   By: Lajean Manes M.D.   On: 09/18/2014 17:14   Dg Chest Port 1 View  09/28/2014   CLINICAL DATA:  PICC placement  EXAM: PORTABLE CHEST - 1 VIEW  COMPARISON:  09/22/2014  FINDINGS: Right PICC tip projects in the lower superior vena cava.  Lung volumes are low as that were on the prior study. There is persistent lung base atelectasis. No convincing pneumonia. No pulmonary edema. No pneumothorax.  IMPRESSION: Right PICC line tip projects in the lower superior vena cava. No convincing pneumonia. No pulmonary edema. Persistent basilar atelectasis.   Electronically Signed   By: Lajean Manes M.D.   On: 09/28/2014 13:37   Dg Chest Port 1 View  09/22/2014   CLINICAL DATA:  Respiratory distress.  EXAM: PORTABLE CHEST - 1 VIEW  COMPARISON:  09/20/2014  FINDINGS: Right PICC line remains in place, unchanged. Very low lung volumes with cardiomegaly and bibasilar atelectasis. No overt edema. No visible effusions. No acute bony abnormality.  IMPRESSION: Very low lung volumes with cardiomegaly and bibasilar atelectasis.   Electronically Signed    By: Rolm Baptise M.D.   On: 09/22/2014 20:49   Dg Chest Port 1 View  09/17/2014   CLINICAL DATA:  Initial evaluation for shortness of breath  EXAM: PORTABLE CHEST - 1 VIEW  COMPARISON:  06/28/2014  FINDINGS: Very limited inspiratory effect. Right lung clear except for mild atelectasis at the base. The retrocardiac left lower lobe is difficult to evaluate. The left upper lobe is clear.  IMPRESSION: Limited study due to very limited inspiratory effect. Retrocardiac left lower lobe difficult to evaluate but left lower lobe infiltrate suspected.   Electronically Signed   By: Skipper Cliche M.D.   On: 09/17/2014 16:30     Microbiology: Recent Results (from the past 240 hour(s))  Culture, Urine     Status: None   Collection Time: 09/22/14  9:02 PM  Result Value Ref Range Status   Specimen Description URINE, CATHETERIZED  Final   Special Requests NONE  Final   Colony Count   Final    15,000 COLONIES/ML Performed at [facility]    Culture   Final    ENTEROCOCCUS SPECIES Performed at [facility]    Report Status 09/25/2014 FINAL  Final   Organism ID, Bacteria ENTEROCOCCUS SPECIES  Final      Susceptibility  Enterococcus species - MIC*    AMPICILLIN <=2 SENSITIVE Sensitive     LEVOFLOXACIN >=8 RESISTANT Resistant     NITROFURANTOIN <=16 SENSITIVE Sensitive     VANCOMYCIN 1 SENSITIVE Sensitive     TETRACYCLINE >=16 RESISTANT Resistant     * ENTEROCOCCUS SPECIES  Culture, blood (routine x 2)     Status: None   Collection Time: 09/22/14 11:50 PM  Result Value Ref Range Status   Specimen Description BLOOD LEFT ARM  Final   Special Requests BOTTLES DRAWN AEROBIC AND ANAEROBIC 3CC  Final   Culture   Final    NO GROWTH 5 DAYS Performed at [facility]    Report Status 09/29/2014 FINAL  Final  Culture, blood (routine x 2)     Status: None   Collection Time: 09/22/14 11:56 PM  Result Value Ref Range Status   Specimen Description BLOOD LEFT HAND  Final    Special Requests BOTTLES DRAWN AEROBIC ONLY 2CC  Final   Culture   Final    NO GROWTH 5 DAYS Performed at [facility]    Report Status 09/29/2014 FINAL  Final  Culture, blood (routine x 2)     Status: None (Preliminary result)   Collection Time: 09/29/14  9:45 AM  Result Value Ref Range Status   Specimen Description BLOOD LEFT HAND  Final   Special Requests BOTTLES DRAWN AEROBIC ONLY 3CC  Final   Culture   Final           BLOOD CULTURE RECEIVED NO GROWTH TO DATE CULTURE WILL BE HELD FOR 5 DAYS BEFORE ISSUING A FINAL NEGATIVE REPORT Performed at [facility]    Report Status PENDING  Incomplete  Culture, blood (routine x 2)     Status: None (Preliminary result)   Collection Time: 09/29/14  9:55 AM  Result Value Ref Range Status   Specimen Description BLOOD LEFT ARM  Final   Special Requests BOTTLES DRAWN AEROBIC ONLY 5CC  Final   Culture   Final           BLOOD CULTURE RECEIVED NO GROWTH TO DATE CULTURE WILL BE HELD FOR 5 DAYS BEFORE ISSUING A FINAL NEGATIVE REPORT Performed at [facility]    Report Status PENDING  Incomplete  Urine culture     Status: None   Collection Time: 09/29/14 11:49 AM  Result Value Ref Range Status   Specimen Description URINE, CATHETERIZED  Final   Special Requests NONE  Final   Colony Count NO GROWTH Performed at [facility]   Final   Culture NO GROWTH Performed at [facility]   Final   Report Status 09/30/2014 FINAL  Final     Labs: Basic Metabolic Panel:  Recent Labs Lab 09/25/14 0355 09/25/14 1830  09/27/14 0345 09/28/14 0536 09/29/14 0430 09/30/14 0455 10/01/14 0535  NA 131* 136  < > 135 132* 136 139 139  K 2.0* 2.6*  < > 3.3* 3.6 3.8 3.8 3.8  CL 99 102  < > 104 100 100 100 100  CO2 26 25  < > 26 25 27 29 27   GLUCOSE 111* 147*  < > 108* 111* 105* 103* 99  BUN 18 21  < > 17 18 19 19 18   CREATININE 2.04* 1.99*  < > 2.23* 2.23* 1.98* 1.59* 1.64*  CALCIUM 7.2* 7.6*  < > 7.7* 7.6* 8.2* 8.9  9.3  MG 1.3* 2.5  --  2.0 1.5  --   --   --   < > =  values in this interval not displayed. Liver Function Tests:  Recent Labs Lab 09/25/14 2359 09/30/14 0455  AST 17 13  ALT 12 10  ALKPHOS 49 54  BILITOT 0.6 0.4  PROT 5.8* 6.9  ALBUMIN 3.0* 3.3*   No results for input(s): LIPASE, AMYLASE in the last 168 hours. No results for input(s): AMMONIA in the last 168 hours. CBC:  Recent Labs Lab 09/27/14 0345 09/28/14 0536 09/29/14 0430 09/30/14 0455 10/01/14 0535  WBC 12.1* 13.2* 14.8* 13.6* 10.9*  HGB 8.0* 7.6* 7.8* 7.9* 8.2*  HCT 24.8* 23.3* 24.3* 24.7* 25.8*  MCV 84.1 84.1 84.7 84.6 83.8  PLT 211 214 307 377 459*   Cardiac Enzymes: No results for input(s): CKTOTAL, CKMB, CKMBINDEX, TROPONINI in the last 168 hours. BNP: Invalid input(s): POCBNP CBG:  Recent Labs Lab 09/27/14 0803  GLUCAP 106*    Time coordinating discharge:  Greater than 30 minutes  Signed:  Izabella Marcantel, DO [facility] Pager: [phone] 10/01/2014, 10:16 AM

## 2014-10-01 NOTE — Clinical Social Work Note (Signed)
CSW received a call from RN stating that pt was ready for discharge  CSW called [facility] and spoke with Graylon Good as well as Rollene Fare who stated pt could return today  CSW faxed discharge summary, prepared packet, provided to RN and called for transport  CSW called pt's daughter, [name] to let her know about pt discharge  No further CSW needs  CSW signing off  .Dede Query, LCSW [facility] Clinical Social Worker - Weekend Coverage cell #: [phone]

## 2014-10-03 ENCOUNTER — Emergency Department (HOSPITAL_COMMUNITY): Payer: Medicare Other

## 2014-10-03 ENCOUNTER — Other Ambulatory Visit: Payer: Self-pay

## 2014-10-03 ENCOUNTER — Non-Acute Institutional Stay (SKILLED_NURSING_FACILITY): Payer: Medicare Other | Admitting: Internal Medicine

## 2014-10-03 ENCOUNTER — Inpatient Hospital Stay (HOSPITAL_COMMUNITY)
Admission: EM | Admit: 2014-10-03 | Discharge: 2014-10-09 | DRG: 070 | Disposition: A | Discharge status: Skilled Nursing Facility | Payer: Medicare Other | Attending: Internal Medicine | Admitting: Internal Medicine

## 2014-10-03 ENCOUNTER — Encounter (HOSPITAL_COMMUNITY): Payer: Self-pay | Admitting: Emergency Medicine

## 2014-10-03 DIAGNOSIS — R0902 Hypoxemia: Secondary | ICD-10-CM

## 2014-10-03 DIAGNOSIS — E669 Obesity, unspecified: Secondary | ICD-10-CM | POA: Diagnosis present

## 2014-10-03 DIAGNOSIS — G546 Phantom limb syndrome with pain: Secondary | ICD-10-CM | POA: Diagnosis present

## 2014-10-03 DIAGNOSIS — Z7901 Long term (current) use of anticoagulants: Secondary | ICD-10-CM

## 2014-10-03 DIAGNOSIS — Z8551 Personal history of malignant neoplasm of bladder: Secondary | ICD-10-CM | POA: Diagnosis not present

## 2014-10-03 DIAGNOSIS — N390 Urinary tract infection, site not specified: Secondary | ICD-10-CM | POA: Diagnosis present

## 2014-10-03 DIAGNOSIS — B961 Klebsiella pneumoniae [K. pneumoniae] as the cause of diseases classified elsewhere: Secondary | ICD-10-CM | POA: Diagnosis not present

## 2014-10-03 DIAGNOSIS — J9601 Acute respiratory failure with hypoxia: Secondary | ICD-10-CM | POA: Diagnosis not present

## 2014-10-03 DIAGNOSIS — Z933 Colostomy status: Secondary | ICD-10-CM

## 2014-10-03 DIAGNOSIS — E2740 Unspecified adrenocortical insufficiency: Secondary | ICD-10-CM | POA: Diagnosis present

## 2014-10-03 DIAGNOSIS — K7290 Hepatic failure, unspecified without coma: Secondary | ICD-10-CM | POA: Diagnosis present

## 2014-10-03 DIAGNOSIS — R4182 Altered mental status, unspecified: Secondary | ICD-10-CM | POA: Diagnosis present

## 2014-10-03 DIAGNOSIS — Z86711 Personal history of pulmonary embolism: Secondary | ICD-10-CM

## 2014-10-03 DIAGNOSIS — C679 Malignant neoplasm of bladder, unspecified: Secondary | ICD-10-CM | POA: Diagnosis not present

## 2014-10-03 DIAGNOSIS — I959 Hypotension, unspecified: Secondary | ICD-10-CM | POA: Diagnosis present

## 2014-10-03 DIAGNOSIS — K219 Gastro-esophageal reflux disease without esophagitis: Secondary | ICD-10-CM | POA: Diagnosis present

## 2014-10-03 DIAGNOSIS — I10 Essential (primary) hypertension: Secondary | ICD-10-CM | POA: Diagnosis not present

## 2014-10-03 DIAGNOSIS — G4700 Insomnia, unspecified: Secondary | ICD-10-CM | POA: Diagnosis present

## 2014-10-03 DIAGNOSIS — G894 Chronic pain syndrome: Secondary | ICD-10-CM | POA: Diagnosis present

## 2014-10-03 DIAGNOSIS — G9341 Metabolic encephalopathy: Secondary | ICD-10-CM | POA: Diagnosis present

## 2014-10-03 DIAGNOSIS — G40909 Epilepsy, unspecified, not intractable, without status epilepticus: Secondary | ICD-10-CM | POA: Diagnosis not present

## 2014-10-03 DIAGNOSIS — G9340 Encephalopathy, unspecified: Secondary | ICD-10-CM | POA: Diagnosis present

## 2014-10-03 DIAGNOSIS — D62 Acute posthemorrhagic anemia: Secondary | ICD-10-CM | POA: Diagnosis not present

## 2014-10-03 DIAGNOSIS — J189 Pneumonia, unspecified organism: Secondary | ICD-10-CM | POA: Diagnosis not present

## 2014-10-03 DIAGNOSIS — N319 Neuromuscular dysfunction of bladder, unspecified: Secondary | ICD-10-CM | POA: Diagnosis present

## 2014-10-03 DIAGNOSIS — Z89612 Acquired absence of left leg above knee: Secondary | ICD-10-CM | POA: Diagnosis not present

## 2014-10-03 DIAGNOSIS — C688 Malignant neoplasm of overlapping sites of urinary organs: Secondary | ICD-10-CM | POA: Diagnosis not present

## 2014-10-03 DIAGNOSIS — E785 Hyperlipidemia, unspecified: Secondary | ICD-10-CM | POA: Diagnosis not present

## 2014-10-03 DIAGNOSIS — Z7952 Long term (current) use of systemic steroids: Secondary | ICD-10-CM

## 2014-10-03 DIAGNOSIS — N1330 Unspecified hydronephrosis: Secondary | ICD-10-CM | POA: Diagnosis not present

## 2014-10-03 DIAGNOSIS — F1010 Alcohol abuse, uncomplicated: Secondary | ICD-10-CM | POA: Diagnosis not present

## 2014-10-03 DIAGNOSIS — E039 Hypothyroidism, unspecified: Secondary | ICD-10-CM | POA: Diagnosis present

## 2014-10-03 DIAGNOSIS — Z1624 Resistance to multiple antibiotics: Secondary | ICD-10-CM | POA: Diagnosis present

## 2014-10-03 DIAGNOSIS — A498 Other bacterial infections of unspecified site: Secondary | ICD-10-CM | POA: Diagnosis not present

## 2014-10-03 DIAGNOSIS — E876 Hypokalemia: Secondary | ICD-10-CM | POA: Diagnosis not present

## 2014-10-03 DIAGNOSIS — I2699 Other pulmonary embolism without acute cor pulmonale: Secondary | ICD-10-CM | POA: Diagnosis not present

## 2014-10-03 DIAGNOSIS — R29810 Facial weakness: Secondary | ICD-10-CM | POA: Diagnosis not present

## 2014-10-03 DIAGNOSIS — Z79899 Other long term (current) drug therapy: Secondary | ICD-10-CM | POA: Diagnosis not present

## 2014-10-03 DIAGNOSIS — R410 Disorientation, unspecified: Secondary | ICD-10-CM

## 2014-10-03 DIAGNOSIS — J9811 Atelectasis: Secondary | ICD-10-CM | POA: Diagnosis present

## 2014-10-03 DIAGNOSIS — J150 Pneumonia due to Klebsiella pneumoniae: Secondary | ICD-10-CM | POA: Diagnosis present

## 2014-10-03 DIAGNOSIS — N189 Chronic kidney disease, unspecified: Secondary | ICD-10-CM | POA: Diagnosis not present

## 2014-10-03 DIAGNOSIS — Z9229 Personal history of other drug therapy: Secondary | ICD-10-CM | POA: Diagnosis not present

## 2014-10-03 DIAGNOSIS — N289 Disorder of kidney and ureter, unspecified: Secondary | ICD-10-CM | POA: Insufficient documentation

## 2014-10-03 DIAGNOSIS — R319 Hematuria, unspecified: Secondary | ICD-10-CM

## 2014-10-03 DIAGNOSIS — N179 Acute kidney failure, unspecified: Secondary | ICD-10-CM | POA: Diagnosis present

## 2014-10-03 DIAGNOSIS — B1920 Unspecified viral hepatitis C without hepatic coma: Secondary | ICD-10-CM | POA: Diagnosis present

## 2014-10-03 DIAGNOSIS — Z87891 Personal history of nicotine dependence: Secondary | ICD-10-CM

## 2014-10-03 DIAGNOSIS — C689 Malignant neoplasm of urinary organ, unspecified: Secondary | ICD-10-CM | POA: Diagnosis not present

## 2014-10-03 DIAGNOSIS — F319 Bipolar disorder, unspecified: Secondary | ICD-10-CM | POA: Diagnosis present

## 2014-10-03 DIAGNOSIS — I129 Hypertensive chronic kidney disease with stage 1 through stage 4 chronic kidney disease, or unspecified chronic kidney disease: Secondary | ICD-10-CM | POA: Diagnosis not present

## 2014-10-03 DIAGNOSIS — R401 Stupor: Secondary | ICD-10-CM | POA: Diagnosis not present

## 2014-10-03 DIAGNOSIS — G8220 Paraplegia, unspecified: Secondary | ICD-10-CM | POA: Diagnosis present

## 2014-10-03 LAB — CBC WITH DIFFERENTIAL/PLATELET
Basophils Absolute: 0.1 10*3/uL (ref 0.0–0.1)
Basophils Relative: 2 % — ABNORMAL HIGH (ref 0–1)
Eosinophils Absolute: 0.1 10*3/uL (ref 0.0–0.7)
Eosinophils Relative: 1 % (ref 0–5)
HCT: 29.2 % — ABNORMAL LOW (ref 39.0–52.0)
Hemoglobin: 9.2 g/dL — ABNORMAL LOW (ref 13.0–17.0)
LYMPHS PCT: 12 % (ref 12–46)
Lymphs Abs: 0.8 10*3/uL (ref 0.7–4.0)
MCH: 26.9 pg (ref 26.0–34.0)
MCHC: 31.5 g/dL (ref 30.0–36.0)
MCV: 85.4 fL (ref 78.0–100.0)
Monocytes Absolute: 0.7 10*3/uL (ref 0.1–1.0)
Monocytes Relative: 11 % (ref 3–12)
NEUTROS ABS: 5 10*3/uL (ref 1.7–7.7)
NEUTROS PCT: 74 % (ref 43–77)
Platelets: 535 10*3/uL — ABNORMAL HIGH (ref 150–400)
RBC: 3.42 MIL/uL — ABNORMAL LOW (ref 4.22–5.81)
RDW: 15.5 % (ref 11.5–15.5)
WBC: 6.6 10*3/uL (ref 4.0–10.5)

## 2014-10-03 LAB — COMPREHENSIVE METABOLIC PANEL
ALT: 14 U/L (ref 0–53)
AST: 21 U/L (ref 0–37)
Albumin: 3.4 g/dL — ABNORMAL LOW (ref 3.5–5.2)
Alkaline Phosphatase: 69 U/L (ref 39–117)
Anion gap: 13 (ref 5–15)
BUN: 18 mg/dL (ref 6–23)
CHLORIDE: 105 mmol/L (ref 96–112)
CO2: 20 mmol/L (ref 19–32)
CREATININE: 1.75 mg/dL — AB (ref 0.50–1.35)
Calcium: 8.9 mg/dL (ref 8.4–10.5)
GFR calc Af Amer: 48 mL/min — ABNORMAL LOW (ref >90)
GFR calc non Af Amer: 41 mL/min — ABNORMAL LOW (ref >90)
Glucose, Bld: 105 mg/dL — ABNORMAL HIGH (ref 70–99)
Potassium: 4.8 mmol/L (ref 3.5–5.1)
Sodium: 138 mmol/L (ref 135–145)
Total Bilirubin: 0.4 mg/dL (ref 0.3–1.2)
Total Protein: 7.4 g/dL (ref 6.0–8.3)

## 2014-10-03 LAB — URINE MICROSCOPIC-ADD ON

## 2014-10-03 LAB — I-STAT CG4 LACTIC ACID, ED: LACTIC ACID, VENOUS: 1.62 mmol/L (ref 0.5–2.0)

## 2014-10-03 LAB — CREATININE, SERUM
CREATININE: 1.64 mg/dL — AB (ref 0.50–1.35)
GFR calc Af Amer: 52 mL/min — ABNORMAL LOW (ref >90)
GFR calc non Af Amer: 45 mL/min — ABNORMAL LOW (ref >90)

## 2014-10-03 LAB — CBG MONITORING, ED: Glucose-Capillary: 94 mg/dL (ref 70–99)

## 2014-10-03 LAB — URINALYSIS, ROUTINE W REFLEX MICROSCOPIC
Bilirubin Urine: NEGATIVE
GLUCOSE, UA: NEGATIVE mg/dL
Ketones, ur: NEGATIVE mg/dL
Nitrite: NEGATIVE
Protein, ur: 100 mg/dL — AB
SPECIFIC GRAVITY, URINE: 1.009 (ref 1.005–1.030)
Urobilinogen, UA: 0.2 mg/dL (ref 0.0–1.0)
pH: 7.5 (ref 5.0–8.0)

## 2014-10-03 LAB — TROPONIN I
Troponin I: <0.03 ng/mL (ref <0.031)
Troponin I: <0.03 ng/mL (ref <0.031)

## 2014-10-03 LAB — I-STAT ARTERIAL BLOOD GAS, ED
Acid-base deficit: 1 mmol/L (ref 0.0–2.0)
BICARBONATE: 23.9 meq/L (ref 20.0–24.0)
O2 Saturation: 96 %
PH ART: 7.364 (ref 7.350–7.450)
TCO2: 25 mmol/L (ref 0–100)
pCO2 arterial: 41.9 mmHg (ref 35.0–45.0)
pO2, Arterial: 86 mmHg (ref 80.0–100.0)

## 2014-10-03 LAB — TSH: TSH: 0.46 u[IU]/mL (ref 0.350–4.500)

## 2014-10-03 LAB — CBC
HCT: 28.7 % — ABNORMAL LOW (ref 39.0–52.0)
HEMOGLOBIN: 9 g/dL — AB (ref 13.0–17.0)
MCH: 26.2 pg (ref 26.0–34.0)
MCHC: 31.4 g/dL (ref 30.0–36.0)
MCV: 83.4 fL (ref 78.0–100.0)
PLATELETS: 554 10*3/uL — AB (ref 150–400)
RBC: 3.44 MIL/uL — AB (ref 4.22–5.81)
RDW: 15.5 % (ref 11.5–15.5)
WBC: 5.9 10*3/uL (ref 4.0–10.5)

## 2014-10-03 LAB — AMMONIA: Ammonia: 73 umol/L — ABNORMAL HIGH (ref 11–32)

## 2014-10-03 LAB — PROTIME-INR
INR: 1.52 — AB (ref 0.00–1.49)
PROTHROMBIN TIME: 18.5 s — AB (ref 11.6–15.2)

## 2014-10-03 MED ORDER — SENNA 8.6 MG PO TABS
2.0000 | ORAL_TABLET | Freq: Every day | ORAL | Status: DC
  Administered 2014-10-04 – 2014-10-09 (×3): 17.2 mg via ORAL
  Filled 2014-10-03 (×5): qty 2

## 2014-10-03 MED ORDER — ENSURE COMPLETE PO LIQD
237.0000 mL | Freq: Two times a day (BID) | ORAL | Status: DC
  Administered 2014-10-08 (×2): 237 mL via ORAL

## 2014-10-03 MED ORDER — LAMOTRIGINE 200 MG PO TABS
200.0000 mg | ORAL_TABLET | Freq: Two times a day (BID) | ORAL | Status: DC
  Administered 2014-10-04 – 2014-10-09 (×11): 200 mg via ORAL
  Filled 2014-10-03 (×17): qty 1

## 2014-10-03 MED ORDER — IPRATROPIUM-ALBUTEROL 0.5-2.5 (3) MG/3ML IN SOLN
3.0000 mL | Freq: Four times a day (QID) | RESPIRATORY_TRACT | Status: DC
  Administered 2014-10-03 – 2014-10-05 (×5): 3 mL via RESPIRATORY_TRACT
  Filled 2014-10-03 (×5): qty 3

## 2014-10-03 MED ORDER — WARFARIN SODIUM 4 MG PO TABS
4.5000 mg | ORAL_TABLET | ORAL | Status: DC
  Filled 2014-10-03: qty 1

## 2014-10-03 MED ORDER — DULOXETINE HCL 60 MG PO CPEP
60.0000 mg | ORAL_CAPSULE | Freq: Every day | ORAL | Status: DC
  Administered 2014-10-04: 60 mg via ORAL
  Filled 2014-10-03 (×3): qty 1

## 2014-10-03 MED ORDER — OXYBUTYNIN CHLORIDE ER 5 MG PO TB24
5.0000 mg | ORAL_TABLET | Freq: Every day | ORAL | Status: DC
  Administered 2014-10-04 – 2014-10-09 (×5): 5 mg via ORAL
  Filled 2014-10-03 (×6): qty 1

## 2014-10-03 MED ORDER — POLYVINYL ALCOHOL 1.4 % OP SOLN
2.0000 [drp] | Freq: Three times a day (TID) | OPHTHALMIC | Status: DC
  Administered 2014-10-03 – 2014-10-09 (×14): 2 [drp] via OPHTHALMIC
  Filled 2014-10-03 (×2): qty 15

## 2014-10-03 MED ORDER — ONDANSETRON HCL 4 MG/2ML IJ SOLN: 4.0000 mg | Freq: Four times a day (QID) | INTRAMUSCULAR | Status: DC | PRN

## 2014-10-03 MED ORDER — FERROUS SULFATE 325 (65 FE) MG PO TABS
325.0000 mg | ORAL_TABLET | Freq: Two times a day (BID) | ORAL | Status: DC
  Administered 2014-10-04 – 2014-10-09 (×9): 325 mg via ORAL
  Filled 2014-10-03 (×11): qty 1

## 2014-10-03 MED ORDER — ACETAMINOPHEN 650 MG RE SUPP: 650.0000 mg | Freq: Four times a day (QID) | RECTAL | Status: DC | PRN

## 2014-10-03 MED ORDER — ASPIRIN EC 325 MG PO TBEC
325.0000 mg | DELAYED_RELEASE_TABLET | Freq: Every day | ORAL | Status: DC
  Administered 2014-10-04 – 2014-10-08 (×5): 325 mg via ORAL
  Filled 2014-10-03 (×7): qty 1

## 2014-10-03 MED ORDER — WARFARIN SODIUM 4 MG PO TABS: 4.0000 mg | ORAL_TABLET | ORAL | Status: DC

## 2014-10-03 MED ORDER — LACTULOSE 10 GM/15ML PO SOLN
20.0000 g | Freq: Four times a day (QID) | ORAL | Status: DC
  Administered 2014-10-04 – 2014-10-07 (×6): 20 g via ORAL
  Filled 2014-10-03 (×15): qty 30

## 2014-10-03 MED ORDER — NITROGLYCERIN 0.4 MG SL SUBL: 0.4000 mg | SUBLINGUAL_TABLET | SUBLINGUAL | Status: DC | PRN

## 2014-10-03 MED ORDER — CALCIUM CARBONATE ANTACID 500 MG PO CHEW
4.0000 | CHEWABLE_TABLET | ORAL | Status: DC | PRN
  Administered 2014-10-06 – 2014-10-08 (×2): 800 mg via ORAL
  Filled 2014-10-03: qty 2
  Filled 2014-10-03: qty 4

## 2014-10-03 MED ORDER — METOCLOPRAMIDE HCL 5 MG PO TABS
5.0000 mg | ORAL_TABLET | Freq: Three times a day (TID) | ORAL | Status: DC
  Administered 2014-10-04: 5 mg via ORAL
  Filled 2014-10-03 (×6): qty 1

## 2014-10-03 MED ORDER — WARFARIN SODIUM 4 MG PO TABS
4.0000 mg | ORAL_TABLET | ORAL | Status: DC
  Filled 2014-10-03: qty 1

## 2014-10-03 MED ORDER — ADULT MULTIVITAMIN W/MINERALS CH
1.0000 | ORAL_TABLET | Freq: Every day | ORAL | Status: DC
Start: 2014-10-03 — End: 2014-10-09
  Administered 2014-10-04 – 2014-10-09 (×5): 1 via ORAL
  Filled 2014-10-03 (×7): qty 1

## 2014-10-03 MED ORDER — POLYETHYL GLYCOL-PROPYL GLYCOL 0.4-0.3 % OP SOLN: 2.0000 [drp] | Freq: Three times a day (TID) | OPHTHALMIC | Status: DC

## 2014-10-03 MED ORDER — METOPROLOL TARTRATE 25 MG PO TABS
25.0000 mg | ORAL_TABLET | Freq: Two times a day (BID) | ORAL | Status: DC
  Administered 2014-10-04: 25 mg via ORAL
  Filled 2014-10-03 (×4): qty 1

## 2014-10-03 MED ORDER — SODIUM CHLORIDE 0.9 % IV SOLN
INTRAVENOUS | Status: DC
  Administered 2014-10-03: 21:00:00 via INTRAVENOUS

## 2014-10-03 MED ORDER — LACTULOSE ENCEPHALOPATHY 10 GM/15ML PO SOLN: 20.0000 g | Freq: Four times a day (QID) | ORAL | Status: DC

## 2014-10-03 MED ORDER — HEPARIN SODIUM (PORCINE) 5000 UNIT/ML IJ SOLN
5000.0000 [IU] | Freq: Three times a day (TID) | INTRAMUSCULAR | Status: DC
  Administered 2014-10-03 – 2014-10-05 (×5): 5000 [IU] via SUBCUTANEOUS
  Filled 2014-10-03 (×6): qty 1

## 2014-10-03 MED ORDER — FOLIC ACID 1 MG PO TABS: 1.0000 mg | ORAL_TABLET | Freq: Every day | ORAL | Status: DC

## 2014-10-03 MED ORDER — BACLOFEN 20 MG PO TABS
20.0000 mg | ORAL_TABLET | ORAL | Status: DC
  Administered 2014-10-04: 20 mg via ORAL
  Filled 2014-10-03 (×5): qty 1

## 2014-10-03 MED ORDER — ALUM & MAG HYDROXIDE-SIMETH 200-200-20 MG/5ML PO SUSP
20.0000 mL | Freq: Two times a day (BID) | ORAL | Status: DC | PRN
  Administered 2014-10-06: 20 mL via ORAL
  Filled 2014-10-03: qty 30

## 2014-10-03 MED ORDER — LEVOTHYROXINE SODIUM 125 MCG PO TABS
125.0000 ug | ORAL_TABLET | Freq: Every day | ORAL | Status: DC
Start: 2014-10-04 — End: 2014-10-05
  Filled 2014-10-03 (×3): qty 1

## 2014-10-03 MED ORDER — PREDNISONE 10 MG PO TABS
10.0000 mg | ORAL_TABLET | Freq: Every day | ORAL | Status: DC
  Filled 2014-10-03 (×2): qty 1

## 2014-10-03 MED ORDER — VITAMIN B-1 100 MG PO TABS
100.0000 mg | ORAL_TABLET | Freq: Every day | ORAL | Status: DC
  Administered 2014-10-04 – 2014-10-09 (×5): 100 mg via ORAL
  Filled 2014-10-03 (×7): qty 1

## 2014-10-03 MED ORDER — HYDRALAZINE HCL 20 MG/ML IJ SOLN
5.0000 mg | INTRAMUSCULAR | Status: DC | PRN
  Administered 2014-10-03: 5 mg via INTRAVENOUS
  Filled 2014-10-03: qty 1

## 2014-10-03 MED ORDER — ATORVASTATIN CALCIUM 10 MG PO TABS
10.0000 mg | ORAL_TABLET | Freq: Every day | ORAL | Status: DC
  Administered 2014-10-04 – 2014-10-08 (×5): 10 mg via ORAL
  Filled 2014-10-03 (×6): qty 1

## 2014-10-03 MED ORDER — ONDANSETRON HCL 4 MG PO TABS: 4.0000 mg | ORAL_TABLET | Freq: Four times a day (QID) | ORAL | Status: DC | PRN

## 2014-10-03 MED ORDER — QUETIAPINE FUMARATE 200 MG PO TABS
200.0000 mg | ORAL_TABLET | Freq: Two times a day (BID) | ORAL | Status: DC
  Administered 2014-10-04 (×2): 200 mg via ORAL
  Filled 2014-10-03 (×5): qty 1

## 2014-10-03 MED ORDER — WARFARIN - PHYSICIAN DOSING INPATIENT: Freq: Every day | Status: DC

## 2014-10-03 MED ORDER — PANTOPRAZOLE SODIUM 40 MG PO TBEC
40.0000 mg | DELAYED_RELEASE_TABLET | Freq: Every day | ORAL | Status: DC
  Administered 2014-10-04 – 2014-10-09 (×5): 40 mg via ORAL
  Filled 2014-10-03 (×4): qty 1

## 2014-10-03 MED ORDER — SODIUM CHLORIDE 0.9 % IJ SOLN
3.0000 mL | Freq: Two times a day (BID) | INTRAMUSCULAR | Status: DC
Start: 2014-10-03 — End: 2014-10-09
  Administered 2014-10-03 – 2014-10-08 (×8): 3 mL via INTRAVENOUS

## 2014-10-03 MED ORDER — PREGABALIN 75 MG PO CAPS
75.0000 mg | ORAL_CAPSULE | Freq: Two times a day (BID) | ORAL | Status: DC
  Administered 2014-10-04 (×2): 75 mg via ORAL
  Filled 2014-10-03 (×2): qty 1

## 2014-10-03 MED ORDER — NALOXONE HCL 0.4 MG/ML IJ SOLN
0.4000 mg | Freq: Once | INTRAMUSCULAR | Status: AC
  Administered 2014-10-03: 0.4 mg via INTRAVENOUS
  Filled 2014-10-03: qty 1

## 2014-10-03 MED ORDER — ACETAMINOPHEN 325 MG PO TABS
650.0000 mg | ORAL_TABLET | Freq: Four times a day (QID) | ORAL | Status: DC | PRN
  Administered 2014-10-04 – 2014-10-09 (×7): 650 mg via ORAL
  Filled 2014-10-03 (×8): qty 2

## 2014-10-03 MED ORDER — MECLIZINE HCL 25 MG PO TABS
25.0000 mg | ORAL_TABLET | Freq: Three times a day (TID) | ORAL | Status: DC | PRN
  Filled 2014-10-03: qty 1

## 2014-10-03 MED ORDER — VITAMIN B-12 1000 MCG PO TABS
1000.0000 ug | ORAL_TABLET | ORAL | Status: DC
  Administered 2014-10-04 – 2014-10-08 (×3): 1000 ug via ORAL
  Filled 2014-10-03 (×3): qty 1

## 2014-10-03 MED ORDER — FOLIC ACID 1 MG PO TABS
1.0000 mg | ORAL_TABLET | Freq: Every day | ORAL | Status: DC
Start: 2014-10-04 — End: 2014-10-09
  Administered 2014-10-06 – 2014-10-09 (×4): 1 mg via ORAL
  Filled 2014-10-03 (×6): qty 1
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
## 2014-10-03 NOTE — ED Notes (Signed)
Attempted report x1. 
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
## 2014-10-03 NOTE — ED Notes (Signed)
Fentanyl patch removed.

## 2014-10-03 NOTE — ED Notes (Signed)
Per EMS, being sent here from [facility] for "acute delerium". Pt ha left side facial droop. Grip strengths are equal. Pt will not speak but is following commands. NAD at this time. Pt easily arrousable to voice.

## 2014-10-03 NOTE — ED Notes (Signed)
CBG 94 
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
## 2014-10-03 NOTE — Progress Notes (Signed)
Received pt report from Mat,RN-ED. Awaiting swallow screen to be done.
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
## 2014-10-04 ENCOUNTER — Inpatient Hospital Stay (HOSPITAL_COMMUNITY): Payer: Medicare Other

## 2014-10-04 DIAGNOSIS — R410 Disorientation, unspecified: Secondary | ICD-10-CM

## 2014-10-04 LAB — COMPREHENSIVE METABOLIC PANEL
ALT: 15 U/L (ref 0–53)
ANION GAP: 12 (ref 5–15)
AST: 20 U/L (ref 0–37)
Albumin: 3.3 g/dL — ABNORMAL LOW (ref 3.5–5.2)
Alkaline Phosphatase: 71 U/L (ref 39–117)
BILIRUBIN TOTAL: 0.5 mg/dL (ref 0.3–1.2)
BUN: 16 mg/dL (ref 6–23)
CO2: 20 mmol/L (ref 19–32)
CREATININE: 1.56 mg/dL — AB (ref 0.50–1.35)
Calcium: 8.9 mg/dL (ref 8.4–10.5)
Chloride: 106 mmol/L (ref 96–112)
GFR calc non Af Amer: 48 mL/min — ABNORMAL LOW (ref >90)
GFR, EST AFRICAN AMERICAN: 55 mL/min — AB (ref >90)
GLUCOSE: 102 mg/dL — AB (ref 70–99)
POTASSIUM: 3.7 mmol/L (ref 3.5–5.1)
SODIUM: 138 mmol/L (ref 135–145)
Total Protein: 7.4 g/dL (ref 6.0–8.3)

## 2014-10-04 LAB — TROPONIN I: Troponin I: <0.03 ng/mL (ref <0.031)

## 2014-10-04 LAB — GLUCOSE, CAPILLARY
GLUCOSE-CAPILLARY: 98 mg/dL (ref 70–99)
GLUCOSE-CAPILLARY: 202 mg/dL — AB (ref 70–99)

## 2014-10-04 LAB — CBC
HEMATOCRIT: 30.3 % — AB (ref 39.0–52.0)
Hemoglobin: 9.7 g/dL — ABNORMAL LOW (ref 13.0–17.0)
MCH: 26.9 pg (ref 26.0–34.0)
MCHC: 32 g/dL (ref 30.0–36.0)
MCV: 83.9 fL (ref 78.0–100.0)
PLATELETS: 548 10*3/uL — AB (ref 150–400)
RBC: 3.61 MIL/uL — AB (ref 4.22–5.81)
RDW: 15.6 % — ABNORMAL HIGH (ref 11.5–15.5)
WBC: 7.8 10*3/uL (ref 4.0–10.5)

## 2014-10-04 LAB — PROTIME-INR
INR: 1.49 (ref 0.00–1.49)
Prothrombin Time: 18.2 seconds — ABNORMAL HIGH (ref 11.6–15.2)

## 2014-10-04 MED ORDER — HALOPERIDOL LACTATE 5 MG/ML IJ SOLN
2.0000 mg | Freq: Once | INTRAMUSCULAR | Status: AC
  Administered 2014-10-04: 2 mg via INTRAVENOUS
  Filled 2014-10-04: qty 1

## 2014-10-04 MED ORDER — WARFARIN SODIUM 7.5 MG PO TABS
7.5000 mg | ORAL_TABLET | Freq: Once | ORAL | Status: AC
  Administered 2014-10-04: 7.5 mg via ORAL
  Filled 2014-10-04: qty 1

## 2014-10-04 MED ORDER — SODIUM CHLORIDE 0.9 % IV SOLN
INTRAVENOUS | Status: AC
  Administered 2014-10-04: 14:00:00 via INTRAVENOUS

## 2014-10-04 MED ORDER — BACLOFEN 10 MG PO TABS
10.0000 mg | ORAL_TABLET | ORAL | Status: DC
  Administered 2014-10-04 (×2): 10 mg via ORAL
  Filled 2014-10-04 (×6): qty 1

## 2014-10-04 MED ORDER — CEFTRIAXONE SODIUM IN DEXTROSE 20 MG/ML IV SOLN
1.0000 g | INTRAVENOUS | Status: DC
Start: 2014-10-04 — End: 2014-10-05
  Administered 2014-10-04: 1 g via INTRAVENOUS
  Filled 2014-10-04: qty 50

## 2014-10-04 MED ORDER — CLONAZEPAM 0.5 MG PO TABS
0.5000 mg | ORAL_TABLET | Freq: Every day | ORAL | Status: DC
  Administered 2014-10-04: 0.5 mg via ORAL
  Filled 2014-10-04: qty 1

## 2014-10-04 MED ORDER — PNEUMOCOCCAL VAC POLYVALENT 25 MCG/0.5ML IJ INJ
0.5000 mL | INJECTION | INTRAMUSCULAR | Status: AC
  Administered 2014-10-05: 0.5 mL via INTRAMUSCULAR
  Filled 2014-10-04 (×2): qty 0.5

## 2014-10-04 MED ORDER — WARFARIN - PHARMACIST DOSING INPATIENT
Freq: Every day | Status: DC
  Administered 2014-10-07: 18:00:00
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
## 2014-10-04 NOTE — Progress Notes (Signed)
Pt is very agitated, shouting and wanting to climb out of bed. Khan,MD made aware. Awaiting for new orders. We will continue to monitor.

## 2014-10-04 NOTE — Progress Notes (Signed)
Patient Demographics  Samuel [name], is a 59 y.o. male, DOB - [birth year], GUY:[id number]  Admit date - 10/03/2014   Admitting Physician Allyne Gee, MD  Outpatient Primary MD for the patient is [name], MD  LOS - 1   Chief Complaint  Patient presents with  . Altered Mental Status  . Facial Droop        Subjective:   Samuel [name] today has, No headache, No chest pain, No abdominal pain - No Nausea, No new weakness tingling or numbness, No Cough - SOB.    Assessment & Plan    1. Encephalopathy - could be due to multiple sedative medications, recent admission there was question of subclinical seizures, EEG few weeks ago unremarkable. Offending medications held he is definitely better but still confused. Request neurology to evaluate. Will cut down baclofen further, place on low-dose scheduled benzodiazepine as he's been on it long-term, reduce Seroquel dose, discontinue Reglan, removed fentanyl patch, reduced oral narcotics, stop Phenergan, for now continue Lamictal. Neurology to evaluate.  Note CT head unremarkable no focal deficits.   2. Recent diagnosis of bladder cancer. Will follow with Dr. [name].   3. History of PE. On Coumadin.   4. History of seizures. For now on Lamictal continue. Neuro to evaluate. Recent EEG unremarkable.   5. Essential hypertension. On beta blocker.   6. Chronic pain. Minimize narcotics due to #1.     Code Status: Full  Family Communication:  None   Disposition Plan: SNF   Procedures   CT Head Stable   Consults   Neuro   Medications  Scheduled Meds: . aspirin EC  325 mg Oral Daily  . atorvastatin  10 mg Oral QHS  . baclofen  10 mg Oral 3 times per day  . DULoxetine  60 mg Oral QHS  . feeding supplement (ENSURE  COMPLETE)  237 mL Oral BID BM  . ferrous sulfate  325 mg Oral BID WC  . folic acid  1 mg Oral Daily  . heparin  5,000 Units Subcutaneous 3 times per day  . ipratropium-albuterol  3 mL Nebulization Q6H  . lactulose  20 g Oral QID  . lamoTRIgine  200 mg Oral BID  . levothyroxine  125 mcg Oral QAC breakfast  . metoCLOPramide  5 mg Oral TID AC & HS  . metoprolol tartrate  25 mg Oral BID  . multivitamin with minerals  1 tablet Oral Daily  . oxybutynin  5 mg Oral Daily  . pantoprazole  40 mg Oral Daily  . polyvinyl alcohol  2 drop Both Eyes TID  . predniSONE  10 mg Oral Q breakfast  . pregabalin  75 mg Oral BID  . QUEtiapine  200 mg Oral BID  . senna  2 tablet Oral Daily  . sodium chloride  3 mL Intravenous Q12H  . thiamine  100 mg Oral Daily  . vitamin B-12  1,000 mcg Oral QODAY  . warfarin  4 mg Oral Once per day on Mon Wed Fri Sat  . warfarin  4.5 mg Oral Once per day on Sun Tue Thu  . Warfarin - Physician Dosing Inpatient   Does not apply q1800   Continuous Infusions: . sodium chloride 50 mL/hr at 10/03/14  2034   PRN Meds:.acetaminophen **OR** acetaminophen, alum & mag hydroxide-simeth, calcium carbonate, hydrALAZINE, meclizine, nitroGLYCERIN, ondansetron **OR** ondansetron (ZOFRAN) IV  DVT Prophylaxis  Couamdin  Lab Results  Component Value Date   INR 1.49 10/04/2014   INR 1.52* 10/03/2014   INR 1.27 10/01/2014     Lab Results  Component Value Date   PLT 548* 10/04/2014    Antibiotics     Anti-infectives    None          Objective:   Filed Vitals:   10/03/14 2221 10/04/14 0517 10/04/14 0518 10/04/14 0802  BP: 163/100  161/85   Pulse:   110   Temp:   98.1 F (36.7 C)   TempSrc:   Oral   Resp:   24   Height:      Weight:  108.1 kg (238 lb 5.1 oz)    SpO2:   100% 97%    Wt Readings from Last 3 Encounters:  10/04/14 108.1 kg (238 lb 5.1 oz)  10/01/14 109.362 kg (241 lb 1.6 oz)  07/03/14 115.939 kg (255 lb 9.6 oz)     Intake/Output Summary (Last  24 hours) at 10/04/14 1129 Last data filed at 10/04/14 0950  Gross per 24 hour  Intake  517.5 ml  Output   1125 ml  Net -607.5 ml     Physical Exam  Awake, riented X 2 No new F.N deficits, Normal affect Samuel [name],PERRAL Supple Neck,No JVD, No cervical lymphadenopathy appriciated.  Symmetrical Chest wall movement, Good air movement bilaterally, CTAB RRR,No Gallops,Rubs or new Murmurs, No Parasternal Heave +ve B.Sounds, Abd Soft, No tenderness, No organomegaly appriciated, No rebound - guarding or rigidity. No Cyanosis, Clubbing or edema, No new Rash or bruise, L Hip disarticulated   Data Review   Micro Results Recent Results (from the past 240 hour(s))  Culture, blood (routine x 2)     Status: None (Preliminary result)   Collection Time: 09/29/14  9:45 AM  Result Value Ref Range Status   Specimen Description BLOOD LEFT HAND  Final   Special Requests BOTTLES DRAWN AEROBIC ONLY 3CC  Final   Culture   Final           BLOOD CULTURE RECEIVED NO GROWTH TO DATE CULTURE WILL BE HELD FOR 5 DAYS BEFORE ISSUING A FINAL NEGATIVE REPORT Performed at [facility]    Report Status PENDING  Incomplete  Culture, blood (routine x 2)     Status: None (Preliminary result)   Collection Time: 09/29/14  9:55 AM  Result Value Ref Range Status   Specimen Description BLOOD LEFT ARM  Final   Special Requests BOTTLES DRAWN AEROBIC ONLY 5CC  Final   Culture   Final           BLOOD CULTURE RECEIVED NO GROWTH TO DATE CULTURE WILL BE HELD FOR 5 DAYS BEFORE ISSUING A FINAL NEGATIVE REPORT Performed at [facility]    Report Status PENDING  Incomplete  Urine culture     Status: None   Collection Time: 09/29/14 11:49 AM  Result Value Ref Range Status   Specimen Description URINE, CATHETERIZED  Final   Special Requests NONE  Final   Colony Count NO GROWTH Performed at [facility]   Final   Culture NO GROWTH Performed at [facility]   Final   Report Status 09/30/2014  FINAL  Final    Radiology Reports   Ct Head Wo Contrast  10/03/2014   CLINICAL DATA:  Altered  mental status. Acute delirium and headache with left-sided facial droop. Patient is unable to speak.  EXAM: CT HEAD WITHOUT CONTRAST  TECHNIQUE: Contiguous axial images were obtained from the base of the skull through the vertex without intravenous contrast.  COMPARISON:  09/23/2014  FINDINGS: Mild technical limitation due to motion artifact. Mild diffuse cerebral atrophy. Ventricles are not dilated. No significant white matter changes. No mass effect or midline shift. No abnormal extra-axial fluid collections. Gray-white matter junctions are distinct. Basal cisterns are not effaced. No evidence of acute intracranial hemorrhage. No depressed skull fractures. Visualized paranasal sinuses and mastoid air cells are not opacified.  IMPRESSION: No acute intracranial abnormalities.  Mild diffuse atrophy.   Electronically Signed   By: Lucienne Capers M.D.   On: 10/03/2014 17:26     Dg Chest Port 1 View  10/03/2014   CLINICAL DATA:  Altered mental status  EXAM: PORTABLE CHEST - 1 VIEW  COMPARISON:  09/28/2014  FINDINGS: Previously seen right-sided PICC line is been removed in the interval. The cardiac shadow is mildly enlarged but stable. Persistent right basilar atelectasis is noted. No new focal abnormality is seen.  IMPRESSION: No acute abnormality noted.   Electronically Signed   By: Inez Catalina M.D.   On: 10/03/2014 16:49       CBC  Recent Labs Lab 09/30/14 0455 10/01/14 0535 10/03/14 1615 10/03/14 2156 10/04/14 0856  WBC 13.6* 10.9* 6.6 5.9 7.8  HGB 7.9* 8.2* 9.2* 9.0* 9.7*  HCT 24.7* 25.8* 29.2* 28.7* 30.3*  PLT 377 459* 535* 554* 548*  MCV 84.6 83.8 85.4 83.4 83.9  MCH 27.1 26.6 26.9 26.2 26.9  MCHC 32.0 31.8 31.5 31.4 32.0  RDW 15.4 15.4 15.5 15.5 15.6*  LYMPHSABS  --   --  0.8  --   --   MONOABS  --   --  0.7  --   --   EOSABS  --   --  0.1  --   --   BASOSABS  --   --  0.1  --   --       Chemistries   Recent Labs Lab 09/28/14 0536 09/29/14 0430 09/30/14 0455 10/01/14 0535 10/03/14 1615 10/03/14 2156 10/04/14 0856  NA 132* 136 139 139 138  --  138  K 3.6 3.8 3.8 3.8 4.8  --  3.7  CL 100 100 100 100 105  --  106  CO2 25 27 29 27 20   --  20  GLUCOSE 111* 105* 103* 99 105*  --  102*  BUN 18 19 19 18 18   --  16  CREATININE 2.23* 1.98* 1.59* 1.64* 1.75* 1.64* 1.56*  CALCIUM 7.6* 8.2* 8.9 9.3 8.9  --  8.9  MG 1.5  --   --   --   --   --   --   AST  --   --  13  --  21  --  20  ALT  --   --  10  --  14  --  15  ALKPHOS  --   --  54  --  69  --  71  BILITOT  --   --  0.4  --  0.4  --  0.5   ------------------------------------------------------------------------------------------------------------------ estimated creatinine clearance is 59.2 mL/min (by C-G formula based on Cr of 1.56). ------------------------------------------------------------------------------------------------------------------ No results for input(s): HGBA1C in the last 72 hours. ------------------------------------------------------------------------------------------------------------------ No results for input(s): CHOL, HDL, LDLCALC, TRIG, CHOLHDL, LDLDIRECT in the last 72 hours. ------------------------------------------------------------------------------------------------------------------  Recent Labs  10/03/14 2156  TSH 0.460   ------------------------------------------------------------------------------------------------------------------ No results for input(s): VITAMINB12, FOLATE, FERRITIN, TIBC, IRON, RETICCTPCT in the last 72 hours.  Coagulation profile  Recent Labs Lab 09/29/14 0430 09/30/14 0455 10/01/14 0535 10/03/14 1615 10/04/14 0856  INR 1.11 1.15 1.27 1.52* 1.49    No results for input(s): DDIMER in the last 72 hours.  Cardiac Enzymes  Recent Labs Lab 10/03/14 2156 10/04/14 0506 10/04/14 0856  TROPONINI <0.03 <0.03 <0.03    ------------------------------------------------------------------------------------------------------------------ Invalid input(s): POCBNP     Time Spent in minutes  35   Kymari Lollis K M.D on 10/04/2014 at 11:29 AM  Between 7am to 7pm - Pager - [phone]  After 7pm go to [email or web address] - password [facility]  [facility]   Office  [phone]
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
## 2014-10-04 NOTE — Clinical Social Work Note (Signed)
Clinical Social Work Department BRIEF PSYCHOSOCIAL ASSESSMENT 10/04/2014  Patient:  Samuel [name], Samuel [name]     Account Number:  [id number]     Admit date:  10/03/2014  Clinical Social Worker:  Myles Lipps  Date/Time:  10/04/2014 04:45 PM  Referred by:  Physician  Date Referred:  10/04/2014 Referred for  SNF Placement   Other Referral:   Interview type:  Family Other interview type:   Patient with altered mental status - spoke with patient daughter, Samuel [name] over the phone    PSYCHOSOCIAL DATA Living Status:  FACILITY Admitted from facility:  [facility] Level of care:  [place] Primary support name:  Samuel [name]   [phone] Primary support relationship to patient:  CHILD, ADULT Degree of support available:   Strong    CURRENT CONCERNS Current Concerns  Post-Acute Placement   Other Concerns:    SOCIAL WORK ASSESSMENT / PLAN Clinical Social Worker spoke with patient daughter over the phone to offer support and discuss patient needs at discharge.  Patient daughter states that patient is a current resident at [facility] and plans to return at discharge.  Patient daughter also expressed that patient confusion is not baseline and is usually the cause of a UTI - MD notified.  Patient daughter has already updated the facility on patient plans to return.  CSW contacted facility to confirm patient return and they are agreeable at this time.  CSW remains available for support and to facilitate patient discharge needs once medically stable.   Assessment/plan status:  Psychosocial Support/Ongoing Assessment of Needs Other assessment/ plan:   Information/referral to community resources:   [facility] representative provided patient daughter feedback to MD and offered additional choice - patient daughter very satisfied with [facility] and does not wish to make any changes at this time.    PATIENT'S/FAMILY'S RESPONSE TO PLAN OF CARE: Patient alert to self  only at this time.  Patient daughter available by phone and knowledgeable regarding patient care and discharge needs.  Patient daughter provided valuable feedback and suggestion to patient continued confusion. Patient daughter agreeable with patient return to [facility].  Patient daughter verbalized understanding of CSW role and appreciation for support and concern.

## 2014-10-04 NOTE — Plan of Care (Signed)
Problem: Phase I Progression Outcomes Goal: Voiding-avoid urinary catheter unless indicated Outcome: Not Met (add Reason) Pt on foley catheter for chronic use.

## 2014-10-04 NOTE — Progress Notes (Signed)
Nutrition Brief Note  Patient identified on the Malnutrition Screening Tool (MST) Report  Wt Readings from Last 15 Encounters:  10/04/14 238 lb 5.1 oz (108.1 kg)  10/01/14 241 lb 1.6 oz (109.362 kg)  07/03/14 255 lb 9.6 oz (115.939 kg)  01/13/14 240 lb (108.863 kg)  12/08/13 238 lb 8.6 oz (108.2 kg)  09/25/13 233 lb (105.688 kg)  09/01/13 243 lb 2.7 oz (110.3 kg)  03/03/13 259 lb 7 oz (117.68 kg)  12/10/12 247 lb (112.038 kg)  12/03/12 222 lb (100.699 kg)    Body mass index is 39.66 kg/(m^2). Patient meets criteria for obesity, class I based on current BMI.   Current diet order is regular, patient is consuming approximately 100% of meals at this time. Labs and medications reviewed.   No nutrition interventions warranted at this time. If nutrition issues arise, please consult RD.   Daleyssa Loiselle A. Jimmye Norman, RD, LDN, CDE Pager: [phone] After hours Pager: [phone]

## 2014-10-04 NOTE — Procedures (Signed)
ELECTROENCEPHALOGRAM REPORT   Patient: Samuel [name]       Room #: 0N47 EEG No. ID: [id number] Age: 59 y.o.        Sex: male Referring Physician: Candiss Norse Report Date:  10/04/2014        Interpreting Physician: Alexis Goodell  History: Samuel [name] is an 59 y.o. male with altered mental status  Medications:  Scheduled: . aspirin EC  325 mg Oral Daily  . atorvastatin  10 mg Oral QHS  . baclofen  10 mg Oral 3 times per day  . clonazePAM  0.5 mg Oral QHS  . DULoxetine  60 mg Oral QHS  . feeding supplement (ENSURE COMPLETE)  237 mL Oral BID BM  . ferrous sulfate  325 mg Oral BID WC  . folic acid  1 mg Oral Daily  . heparin  5,000 Units Subcutaneous 3 times per day  . ipratropium-albuterol  3 mL Nebulization Q6H  . lactulose  20 g Oral QID  . lamoTRIgine  200 mg Oral BID  . levothyroxine  125 mcg Oral QAC breakfast  . metoprolol tartrate  25 mg Oral BID  . multivitamin with minerals  1 tablet Oral Daily  . oxybutynin  5 mg Oral Daily  . pantoprazole  40 mg Oral Daily  . [START ON 10/05/2014] pneumococcal 23 valent vaccine  0.5 mL Intramuscular Tomorrow-1000  . polyvinyl alcohol  2 drop Both Eyes TID  . predniSONE  10 mg Oral Q breakfast  . pregabalin  75 mg Oral BID  . QUEtiapine  200 mg Oral BID  . senna  2 tablet Oral Daily  . sodium chloride  3 mL Intravenous Q12H  . thiamine  100 mg Oral Daily  . vitamin B-12  1,000 mcg Oral QODAY  . Warfarin - Pharmacist Dosing Inpatient   Does not apply q1800    Conditions of Recording:  This is a 16 channel EEG carried out with the patient in the awake but confused state.  Description:  The background activity is discontinuous.  There are periods of generalized attenuation lasting 1-2 seconds alternating with periods of more activity.  The activity during these periods is of higher voltage and is dominated by poorly organized alpha activity with some slower frequencies noted.  They last from 2-3 seconds on average.  Embedded within  these periods of faster activity are frequent intermittent periodic discharges of triphasic morphology.  There are periods during the tracing when the periods of attenuation are less frequent and prominent with the poorly organized alpha activity dominating.  During these periods the tracing becomes more continuous.  This is not sustained though.  Hyperventilation and intermittent photic stimulation were not performed.   IMPRESSION: This is an abnormal electroencephalogram characterized by discontinuity and triphasic waves.  This finding may be seen with a diffuse disturbance that is etiologically nonspecific but is most commonly associated with a metabolic encephalopathy.   Alexis Goodell, MD [facility] [phone] 10/04/2014, 5:53 PM

## 2014-10-04 NOTE — Progress Notes (Signed)
EEG completed, results pending. 
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
## 2014-10-05 ENCOUNTER — Inpatient Hospital Stay (HOSPITAL_COMMUNITY): Payer: Medicare Other

## 2014-10-05 DIAGNOSIS — N289 Disorder of kidney and ureter, unspecified: Secondary | ICD-10-CM | POA: Insufficient documentation

## 2014-10-05 DIAGNOSIS — G9341 Metabolic encephalopathy: Secondary | ICD-10-CM | POA: Diagnosis not present

## 2014-10-05 DIAGNOSIS — G9340 Encephalopathy, unspecified: Secondary | ICD-10-CM

## 2014-10-05 DIAGNOSIS — N179 Acute kidney failure, unspecified: Secondary | ICD-10-CM

## 2014-10-05 LAB — HEMOGLOBIN A1C
Hgb A1c MFr Bld: 5.5 % (ref 4.8–5.6)
MEAN PLASMA GLUCOSE: 111 mg/dL

## 2014-10-05 LAB — GLUCOSE, CAPILLARY
GLUCOSE-CAPILLARY: 112 mg/dL — AB (ref 70–99)
GLUCOSE-CAPILLARY: 166 mg/dL — AB (ref 70–99)
GLUCOSE-CAPILLARY: 179 mg/dL — AB (ref 70–99)
GLUCOSE-CAPILLARY: 182 mg/dL — AB (ref 70–99)
Glucose-Capillary: 141 mg/dL — ABNORMAL HIGH (ref 70–99)
Glucose-Capillary: 252 mg/dL — ABNORMAL HIGH (ref 70–99)

## 2014-10-05 LAB — BLOOD GAS, ARTERIAL
Acid-base deficit: 5.1 mmol/L — ABNORMAL HIGH (ref 0.0–2.0)
Bicarbonate: 19.4 mEq/L — ABNORMAL LOW (ref 20.0–24.0)
Drawn by: 430981
FIO2: 100 %
O2 Saturation: 99.2 %
PCO2 ART: 35.4 mmHg (ref 35.0–45.0)
PO2 ART: 224 mmHg — AB (ref 80.0–100.0)
Patient temperature: 98.6
TCO2: 20.5 mmol/L (ref 0–100)
pH, Arterial: 7.359 (ref 7.350–7.450)

## 2014-10-05 LAB — URINE MICROSCOPIC-ADD ON

## 2014-10-05 LAB — BASIC METABOLIC PANEL
ANION GAP: 10 (ref 5–15)
ANION GAP: 6 (ref 5–15)
BUN: 20 mg/dL (ref 6–23)
BUN: 18 mg/dL (ref 6–23)
CALCIUM: 8.1 mg/dL — AB (ref 8.4–10.5)
CHLORIDE: 109 mmol/L (ref 96–112)
CO2: 21 mmol/L (ref 19–32)
CO2: 22 mmol/L (ref 19–32)
Calcium: 7.3 mg/dL — ABNORMAL LOW (ref 8.4–10.5)
Chloride: 104 mmol/L (ref 96–112)
Creatinine, Ser: 2.32 mg/dL — ABNORMAL HIGH (ref 0.50–1.35)
Creatinine, Ser: 2.07 mg/dL — ABNORMAL HIGH (ref 0.50–1.35)
GFR calc Af Amer: 34 mL/min — ABNORMAL LOW (ref >90)
GFR calc non Af Amer: 30 mL/min — ABNORMAL LOW (ref >90)
GFR calc non Af Amer: 34 mL/min — ABNORMAL LOW (ref >90)
GFR, EST AFRICAN AMERICAN: 39 mL/min — AB (ref >90)
GLUCOSE: 128 mg/dL — AB (ref 70–99)
Glucose, Bld: 189 mg/dL — ABNORMAL HIGH (ref 70–99)
POTASSIUM: 3.4 mmol/L — AB (ref 3.5–5.1)
Potassium: 3.5 mmol/L (ref 3.5–5.1)
SODIUM: 135 mmol/L (ref 135–145)
SODIUM: 137 mmol/L (ref 135–145)

## 2014-10-05 LAB — URINALYSIS, ROUTINE W REFLEX MICROSCOPIC
Glucose, UA: NEGATIVE mg/dL
Ketones, ur: 15 mg/dL — AB
NITRITE: POSITIVE — AB
SPECIFIC GRAVITY, URINE: 1.019 (ref 1.005–1.030)
Urobilinogen, UA: 1 mg/dL (ref 0.0–1.0)
pH: 6.5 (ref 5.0–8.0)

## 2014-10-05 LAB — CBC
HEMATOCRIT: 26.6 % — AB (ref 39.0–52.0)
HEMATOCRIT: 26.5 % — AB (ref 39.0–52.0)
Hemoglobin: 8.5 g/dL — ABNORMAL LOW (ref 13.0–17.0)
Hemoglobin: 8.4 g/dL — ABNORMAL LOW (ref 13.0–17.0)
MCH: 26.8 pg (ref 26.0–34.0)
MCH: 26.5 pg (ref 26.0–34.0)
MCHC: 32 g/dL (ref 30.0–36.0)
MCHC: 31.7 g/dL (ref 30.0–36.0)
MCV: 83.9 fL (ref 78.0–100.0)
MCV: 83.6 fL (ref 78.0–100.0)
PLATELETS: 446 10*3/uL — AB (ref 150–400)
Platelets: 466 10*3/uL — ABNORMAL HIGH (ref 150–400)
RBC: 3.17 MIL/uL — AB (ref 4.22–5.81)
RBC: 3.17 MIL/uL — ABNORMAL LOW (ref 4.22–5.81)
RDW: 15.5 % (ref 11.5–15.5)
RDW: 15.5 % (ref 11.5–15.5)
WBC: 11.3 10*3/uL — AB (ref 4.0–10.5)
WBC: 9.2 10*3/uL (ref 4.0–10.5)

## 2014-10-05 LAB — CORTISOL: Cortisol, Plasma: 36.3 ug/dL

## 2014-10-05 LAB — CULTURE, BLOOD (ROUTINE X 2)
CULTURE: NO GROWTH
CULTURE: NO GROWTH

## 2014-10-05 LAB — PROCALCITONIN: PROCALCITONIN: 9.32 ng/mL

## 2014-10-05 LAB — CARBOXYHEMOGLOBIN
Carboxyhemoglobin: 0.9 % (ref 0.5–1.5)
METHEMOGLOBIN: 1 % (ref 0.0–1.5)
O2 SAT: 79.4 %
TOTAL HEMOGLOBIN: 8.8 g/dL — AB (ref 13.5–18.0)

## 2014-10-05 LAB — PHOSPHORUS: Phosphorus: 4 mg/dL (ref 2.3–4.6)

## 2014-10-05 LAB — AMMONIA: AMMONIA: 15 umol/L (ref 11–32)

## 2014-10-05 LAB — TROPONIN I: Troponin I: <0.03 ng/mL (ref <0.031)

## 2014-10-05 LAB — LACTIC ACID, PLASMA
Lactic Acid, Venous: 1.3 mmol/L (ref 0.5–2.0)
Lactic Acid, Venous: 1.5 mmol/L (ref 0.5–2.0)

## 2014-10-05 LAB — PROTIME-INR
INR: 2.05 — ABNORMAL HIGH (ref 0.00–1.49)
PROTHROMBIN TIME: 23.3 s — AB (ref 11.6–15.2)

## 2014-10-05 LAB — MAGNESIUM: Magnesium: 1.9 mg/dL (ref 1.5–2.5)

## 2014-10-05 MED ORDER — SODIUM CHLORIDE 0.9 % IV BOLUS (SEPSIS)
1000.0000 mL | Freq: Once | INTRAVENOUS | Status: AC
  Administered 2014-10-05: 1000 mL via INTRAVENOUS

## 2014-10-05 MED ORDER — IPRATROPIUM-ALBUTEROL 0.5-2.5 (3) MG/3ML IN SOLN
3.0000 mL | RESPIRATORY_TRACT | Status: DC | PRN
  Administered 2014-10-07 – 2014-10-08 (×3): 3 mL via RESPIRATORY_TRACT
  Filled 2014-10-05 (×3): qty 3

## 2014-10-05 MED ORDER — SODIUM CHLORIDE 0.9 % IV SOLN
1.0000 g | Freq: Once | INTRAVENOUS | Status: AC
  Administered 2014-10-05: 1 g via INTRAVENOUS
  Filled 2014-10-05: qty 1

## 2014-10-05 MED ORDER — INSULIN ASPART 100 UNIT/ML ~~LOC~~ SOLN
0.0000 [IU] | SUBCUTANEOUS | Status: DC
Start: 2014-10-05 — End: 2014-10-06
  Administered 2014-10-05: 3 [IU] via SUBCUTANEOUS
  Administered 2014-10-05: 2 [IU] via SUBCUTANEOUS
  Administered 2014-10-05: 8 [IU] via SUBCUTANEOUS
  Administered 2014-10-06: 3 [IU] via SUBCUTANEOUS

## 2014-10-05 MED ORDER — LEVOTHYROXINE SODIUM 25 MCG PO TABS
125.0000 ug | ORAL_TABLET | Freq: Every day | ORAL | Status: DC
  Administered 2014-10-06 – 2014-10-09 (×4): 125 ug via ORAL
  Filled 2014-10-05 (×9): qty 1

## 2014-10-05 MED ORDER — VANCOMYCIN HCL 10 G IV SOLR
1500.0000 mg | Freq: Once | INTRAVENOUS | Status: AC
  Administered 2014-10-05: 1500 mg via INTRAVENOUS
  Filled 2014-10-05: qty 1500

## 2014-10-05 MED ORDER — PHENYLEPHRINE HCL 10 MG/ML IJ SOLN
20.0000 ug/min | INTRAVENOUS | Status: DC
  Administered 2014-10-05: 100 ug/min via INTRAVENOUS

## 2014-10-05 MED ORDER — LEVOTHYROXINE SODIUM 100 MCG IV SOLR: 62.5000 ug | Freq: Every day | INTRAVENOUS | Status: DC

## 2014-10-05 MED ORDER — FENTANYL CITRATE 0.05 MG/ML IJ SOLN
25.0000 ug | INTRAMUSCULAR | Status: DC | PRN
  Administered 2014-10-05 – 2014-10-06 (×5): 100 ug via INTRAVENOUS
  Administered 2014-10-06: 50 ug via INTRAVENOUS
  Administered 2014-10-07 – 2014-10-09 (×11): 100 ug via INTRAVENOUS
  Filled 2014-10-05 (×18): qty 2

## 2014-10-05 MED ORDER — POTASSIUM CHLORIDE 10 MEQ/50ML IV SOLN
10.0000 meq | INTRAVENOUS | Status: AC
  Administered 2014-10-05 (×3): 10 meq via INTRAVENOUS
  Filled 2014-10-05 (×3): qty 50

## 2014-10-05 MED ORDER — HYDROCORTISONE NA SUCCINATE PF 100 MG IJ SOLR
100.0000 mg | Freq: Three times a day (TID) | INTRAMUSCULAR | Status: DC
  Administered 2014-10-05 – 2014-10-08 (×10): 100 mg via INTRAVENOUS
  Filled 2014-10-05 (×10): qty 2

## 2014-10-05 MED ORDER — QUETIAPINE FUMARATE 100 MG PO TABS
200.0000 mg | ORAL_TABLET | Freq: Two times a day (BID) | ORAL | Status: DC
  Administered 2014-10-05 – 2014-10-09 (×8): 200 mg via ORAL
  Filled 2014-10-05: qty 1
  Filled 2014-10-05 (×4): qty 2
  Filled 2014-10-05: qty 4
  Filled 2014-10-05 (×2): qty 1

## 2014-10-05 MED ORDER — PHENYLEPHRINE HCL 10 MG/ML IJ SOLN
0.0000 ug/min | INTRAVENOUS | Status: DC
  Administered 2014-10-05: 110.133 ug/min via INTRAVENOUS
  Filled 2014-10-05: qty 4

## 2014-10-05 MED ORDER — WARFARIN SODIUM 5 MG PO TABS
5.0000 mg | ORAL_TABLET | Freq: Once | ORAL | Status: AC
  Administered 2014-10-05: 5 mg via ORAL
  Filled 2014-10-05: qty 1

## 2014-10-05 MED ORDER — HYDROCORTISONE NA SUCCINATE PF 100 MG IJ SOLR: 100.0000 mg | Freq: Once | INTRAMUSCULAR | Status: DC

## 2014-10-05 MED ORDER — DULOXETINE HCL 60 MG PO CPEP
60.0000 mg | ORAL_CAPSULE | Freq: Every day | ORAL | Status: DC
  Administered 2014-10-05 – 2014-10-08 (×4): 60 mg via ORAL
  Filled 2014-10-05 (×4): qty 1

## 2014-10-05 MED ORDER — PHENYLEPHRINE HCL 10 MG/ML IJ SOLN
0.0000 ug/min | INTRAVENOUS | Status: DC
  Administered 2014-10-05: 20 ug/min via INTRAVENOUS
  Administered 2014-10-05: 110 ug/min via INTRAVENOUS
  Filled 2014-10-05 (×2): qty 1

## 2014-10-05 MED ORDER — CLONAZEPAM 0.5 MG PO TABS
0.5000 mg | ORAL_TABLET | Freq: Every day | ORAL | Status: DC
  Administered 2014-10-05 – 2014-10-08 (×4): 0.5 mg via ORAL
  Filled 2014-10-05 (×4): qty 1

## 2014-10-05 MED ORDER — LIDOCAINE HCL 2 % EX GEL
1.0000 "application " | Freq: Once | CUTANEOUS | Status: AC
  Administered 2014-10-05: 1 via URETHRAL
  Filled 2014-10-05: qty 5

## 2014-10-05 MED ORDER — VANCOMYCIN HCL 10 G IV SOLR
1500.0000 mg | INTRAVENOUS | Status: DC
  Administered 2014-10-07: 1500 mg via INTRAVENOUS
  Filled 2014-10-05: qty 1500

## 2014-10-05 MED ORDER — SODIUM CHLORIDE 0.9 % IV SOLN
500.0000 mg | Freq: Two times a day (BID) | INTRAVENOUS | Status: DC
  Administered 2014-10-05 – 2014-10-06 (×2): 500 mg via INTRAVENOUS
  Filled 2014-10-05 (×3): qty 0.5

## 2014-10-05 MED ORDER — SODIUM CHLORIDE 0.9 % IV SOLN
INTRAVENOUS | Status: DC
  Administered 2014-10-05 (×2): via INTRAVENOUS

## 2014-10-05 MED ORDER — SODIUM CHLORIDE 0.9 % IV BOLUS (SEPSIS): 1000.0000 mL | Freq: Once | INTRAVENOUS | Status: AC

## 2014-10-05 NOTE — Progress Notes (Signed)
1100:  Coude #20 inserted without incident, catheter patent with free urine return. Elliot Cousin, RN,CRRN,CBIS

## 2014-10-05 NOTE — Procedures (Signed)
Central Venous Catheter Insertion Procedure Note Samuel [name] [id number] [birth year]  Procedure: Insertion of Central Venous Catheter Indications: Assessment of intravascular volume, Drug and/or fluid administration and Frequent blood sampling  Procedure Details Consent: Unable to obtain consent because of altered level of consciousness. Time Out: Verified patient identification, verified procedure, site/side was marked, verified correct patient position, special equipment/implants available, medications/allergies/relevent history reviewed, required imaging and test results available.  Performed  Maximum sterile technique was used including antiseptics, cap, gloves, gown, hand hygiene, mask and sheet. Skin prep: Chlorhexidine; local anesthetic administered A antimicrobial bonded/coated triple lumen catheter was placed in the right internal jugular vein using the Seldinger technique.  Evaluation Blood flow good Complications: No apparent complications Patient did tolerate procedure well. Chest X-ray ordered to verify placement.  CXR: pending.  Procedure performed under direct ultrasound guidance for real time vessel cannulation.      Montey Hora, [facility] Pager: [phone]  or [phone] 10/05/2014, 2:24 AM   Required Agree with placement  Lavon Paganini. Titus Mould, MD, [place] Pgr: [facility]

## 2014-10-05 NOTE — Progress Notes (Signed)
Patient evaluated, chart reviewed in details, patient examined, labs reviewed.  Currently able to protect his airway.  No need for intubation.  Will intermittently check on during the day to insure airway protection.  The patient is critically ill with multiple organ systems failure and requires high complexity decision making for assessment and support, frequent evaluation and titration of therapies, application of advanced monitoring technologies and extensive interpretation of multiple databases.   Critical Care Time devoted to patient care services described in this note is  45  Minutes. This time reflects time of care of this signee Dr Jennet Maduro. This critical care time does not reflect procedure time, or teaching time or supervisory time of PA/NP/Med student/Med Resident etc but could involve care discussion time.  Rush Farmer, M.D. [facility]. Pager: [phone]. After hours pager: [phone].

## 2014-10-05 NOTE — Progress Notes (Signed)
RN entered pt's room to give medications. Pt was pale but alert and oriented x 2 (person, DOB, hospital), responded to questions/commands appropriately. RN scanned pt's scheduled medications, no BP charted this shift-waited to give BP medication. Called tech to obtain vitals and CBG and to notify RN of results. RN walked into pt's room when NT was still getting vitals, RN waited for results. CBG was 202, BP 88/49. RN notified Baltazar Najjar, NP of results. Order received to hold metoprolol.  Order implemented. Verbal order from [place], NP to obtain vitals in 1 hour. On-coming RN Jarrett Soho) went to get vitals and requested assistance assessing pt since previous RN had assessed pt previously on shift.  RN received call from [place] about new orders and vitals. RN entered pt's room, assessed pt and noted a change in condition. Pt appeared more pale and less responsive than previously.  RN obtained vitals and notified Baltazar Najjar who was at bedside. Verbal orders for non-rebreather, bolus, and ABGs received and implemented. New order for transfer to step-down received. Rapid response notified, Brooke assessed pt and decided ICU best placement. New order for ICU placement received. Vitals obtained approximately q 3-5 minutes, BP continues to decline. CBG rechecked, result 179. Pt transferred to 3M13 by nursing team including Baltazar Najjar, NP. Report given at bedside.

## 2014-10-05 NOTE — Progress Notes (Signed)
ANTICOAGULATION CONSULT NOTE - Follow Up Consult  Pharmacy Consult for Coumadin Indication: PE history  Allergies  Allergen Reactions  . Tomato Other (See Comments)    Unknown reaction    Patient Measurements: Height: 5\' 5"  (165.1 cm) Weight: 238 lb 5.1 oz (108.1 kg) IBW/kg (Calculated) : 61.5  Labs:  Recent Labs  10/03/14 1615  10/04/14 0506 10/04/14 0856 10/05/14 0119 10/05/14 0420 10/05/14 0450 10/05/14 0549  HGB 9.2*  < >  --  9.7* 8.5*  --  8.4*  --   HCT 29.2*  < >  --  30.3* 26.6*  --  26.5*  --   PLT 535*  < >  --  548* 446*  --  466*  --   LABPROT 18.5*  --   --  18.2*  --   --   --  23.3*  INR 1.52*  --   --  1.49  --   --   --  2.05*  CREATININE 1.75*  < >  --  1.56* 2.32*  --  2.07*  --   TROPONINI <0.03  < > <0.03 <0.03  --  <0.03  --   --   < > = values in this interval not displayed.  Estimated Creatinine Clearance: 44.6 mL/min (by C-G formula based on Cr of 2.07).  Assessment: 59 year old male continues on Coumadin for PE history admitted with AMS and now transferred today to 69M for sepsis on broad spectrum antibiotics. INR = 2.05 today  Goal of Therapy:  INR 2-3 Monitor platelets by anticoagulation protocol: Yes   Plan:  Coumadin 5 mg po x 1 today Daily INR DC sq heparin  Thank you. Anette Guarneri, PharmD [phone]  10/05/2014,10:51 AM

## 2014-10-05 NOTE — Progress Notes (Signed)
[facility] ADULT ICU REPLACEMENT PROTOCOL FOR AM LAB REPLACEMENT ONLY  The patient does not apply for the [facility] Adult ICU Electrolyte Replacment Protocol based on the criteria listed below:    Is GFR >/= 40 ml/min? No.  Patient's GFR today is 30    Abnormal electrolyte(s): K3.4   If a panic level lab has been reported, has the CCM MD in charge been notified? Yes.  .   Physician:  Earl Many 10/05/2014 4:51 AM

## 2014-10-05 NOTE — Progress Notes (Signed)
[place] Progress Note Patient Name: Samuel [name] DOB: Oct 18, 1955 MRN: [id number]   Date of Service  10/05/2014  HPI/Events of Note  Severe phantom limb pain. Chronic pain syndrome. Chronic opioid use  eICU Interventions  PRN fentanyl     Intervention Category Intermediate Interventions: Pain - evaluation and management  Merton Border 10/05/2014, 3:55 PM
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
## 2014-10-05 NOTE — Progress Notes (Signed)
PCCM Interval Progress Note  BP improved (102/60); however, on 179mcg of phenylephrine.  CVP is 4.  Will give an additional 1L IVF bolus now and assess CVP response.  Attempt to decrease phenylephrine.   Montey Hora, [place] [facility] Pager: [phone]  or [phone] 10/05/2014, 5:11 AM

## 2014-10-05 NOTE — Significant Event (Signed)
Rapid Response Event Note Called per bedside RN for pt with new AMS and low BP. [facility] NP at bedside, orders placed for SDU transfer.   Overview: Time Called: 0040 Arrival Time: 0045 Event Type: Respiratory, Cardiac, Hypotension  Initial Focused Assessment: Pt found resting in bed with shallow breathing on NRB. Po2 97-99%. Lethargic, opens eyes and grunts to sternal rub only. Pt does not follow commands. BP low 70/40 initially with NS bolus infusing.   Interventions: Due to critically low BP, ICU bed suggested for pressors. Pt transferred to 3 M room 13. PCCM notified per [facility] NP. PCCM Rahul Gayla Doss. To bedside in unit  Event Summary: Name of Physician Notified: [facility] K.Kirby NP at  (AT [place] [facility])  Name of Consulting Physician Notified: Franco Collet PCCM MD at [place]  at [place]: Transferred (Comment) (3 m)  Event End Time: [place], [place]

## 2014-10-05 NOTE — Progress Notes (Signed)
Patient ID: Samuel [name], male   DOB: August 24, 1955, 59 y.o.   MRN: [id number]   HPI: [name] is a 59 yo obese, paraplegic WM with multiple comorbidities, including; trauma with resultant paraplegia, Left AKA, neurogenic bladder, recent (2/16) dx of bladder ca with TURBT, chronic UTIs, PE on Coumadin (PE after hip surgery in Feb 2015), chronic pain syndrome, Hepatitis C, hx ETOH abuse and polysubstance abuse, anemia, HTN, HLD, bipolar disorder, seizures, colon resection with colostomy, etc.   Pt admitted by [facility] on 10/03/14 for AMS thought to be multifactorial from medications, seizures and UTI. Placed on Rocephin for UTI. Mental status has improved over past 24 hrs. Low dose benzos were continued to prevent withdrawal given chronic benzo use. Since admission-Blood cultures were neg. Urine cx was negative. CXR was negative. LA was normal. Trops neg x 3. CBC stable (anemia from hematuria, chronic foley).  No leukocytosis since admission. TSH .46.   Tonight, RN paged this NP secondary to pt's BP becoming soft in the 90s and also because he was acutely more difficult to arouse. He did received his bedtime dose of Klonopin which was continued as above. At the time of Klonopin dose, pt had been conversing with the RN and was alert/oriented.  NP to bedside. Upon arrival, pt very lethargic and arouses only for a few seconds to tactile stimulation. Knows his name but falls back to sleep. T 100.6. Skin is warm to touch. O2 sat on RA was 90%, so pt placed on NRB. Shallow respirations noted with short periods of apnea. BP in the 70s. Pt placed in trendelenburg and NS bolus running. BP continued to fall with last one in the upper 60s. HR stable. O2 sat 100% on NRB. RR 28. CBG 170s. Urine reddish brown.  Multiple labs/tests ordered and pt transferred emergently to ICU after notifying PCCM.  At that time, anticipated intubation.   Upon arrival to ICU, pt's mental status same as above. BP further decreased into the  50s and Neosynephrine started. ABG with pH 7.35, PCO2 35, PO2 224, and bicarb 20.5. No indication for bipap or intubation at this time. Labs drawn-blood cultures, lactate, CBC, BMP, CXR ordered, urinalysis and culture ordered. Amare Bail Kays, PA with PCCM present and is placing line and PCCM is assuming care of pt. Stress dose steroids started.   In review of chart, pt is on steroid daily, ? Why. This NP combed through old notes from SNF to find that in Feb 2016 pt was thought to possibly have a component of COPD and was placed on a taper. He then had the TURBT and 10mg  was continued, but no further comment on pulmonary status at that time.  From Sep 17, 2014 through October 01, 2014, pt was hospitalized here for TURBT, AMS, and HCAP.  Urology performed procedure leaving in chronic foley. Pt had anemia secondary to hematuria which was stable. Had some suicidal thoughts and psych saw him and said he didn't need sitter or inpt admission at that time. Pt was discharged to SNF on 10/01/14 and returned for readmission here on 10/03/14.  Care handed off to PCCM.   Clance Boll, NP [facility]
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
## 2014-10-06 DIAGNOSIS — R401 Stupor: Secondary | ICD-10-CM

## 2014-10-06 DIAGNOSIS — I959 Hypotension, unspecified: Secondary | ICD-10-CM

## 2014-10-06 LAB — GLUCOSE, CAPILLARY
GLUCOSE-CAPILLARY: 165 mg/dL — AB (ref 70–99)
Glucose-Capillary: 191 mg/dL — ABNORMAL HIGH (ref 70–99)
Glucose-Capillary: 112 mg/dL — ABNORMAL HIGH (ref 70–99)
Glucose-Capillary: 113 mg/dL — ABNORMAL HIGH (ref 70–99)
Glucose-Capillary: 130 mg/dL — ABNORMAL HIGH (ref 70–99)
Glucose-Capillary: 114 mg/dL — ABNORMAL HIGH (ref 70–99)

## 2014-10-06 LAB — PROCALCITONIN: Procalcitonin: 2.63 ng/mL

## 2014-10-06 LAB — PROTIME-INR
INR: 3.16 — AB (ref 0.00–1.49)
Prothrombin Time: 32.7 seconds — ABNORMAL HIGH (ref 11.6–15.2)

## 2014-10-06 MED ORDER — SODIUM CHLORIDE 0.9 % IV SOLN
1.0000 g | Freq: Two times a day (BID) | INTRAVENOUS | Status: DC
  Administered 2014-10-06 – 2014-10-07 (×2): 1 g via INTRAVENOUS
  Filled 2014-10-06 (×3): qty 1

## 2014-10-06 MED ORDER — GUAIFENESIN-DM 100-10 MG/5ML PO SYRP
5.0000 mL | ORAL_SOLUTION | ORAL | Status: DC | PRN
  Administered 2014-10-07 – 2014-10-09 (×7): 5 mL via ORAL
  Filled 2014-10-06 (×7): qty 5

## 2014-10-06 MED ORDER — LORAZEPAM 2 MG/ML IJ SOLN
INTRAMUSCULAR | Status: AC
  Filled 2014-10-06: qty 1

## 2014-10-06 MED ORDER — HALOPERIDOL LACTATE 5 MG/ML IJ SOLN
5.0000 mg | INTRAMUSCULAR | Status: AC | PRN
  Administered 2014-10-06 (×3): 5 mg via INTRAVENOUS
  Filled 2014-10-06 (×3): qty 1

## 2014-10-06 MED ORDER — INSULIN ASPART 100 UNIT/ML ~~LOC~~ SOLN
0.0000 [IU] | Freq: Three times a day (TID) | SUBCUTANEOUS | Status: DC
  Administered 2014-10-06 – 2014-10-08 (×4): 2 [IU] via SUBCUTANEOUS

## 2014-10-06 MED ORDER — LORAZEPAM 2 MG/ML IJ SOLN
2.0000 mg | Freq: Once | INTRAMUSCULAR | Status: AC
  Administered 2014-10-06: 2 mg via INTRAVENOUS

## 2014-10-06 NOTE — Progress Notes (Addendum)
PULMONARY / CRITICAL CARE MEDICINE   Name: Samuel [name] MRN: [id number] DOB: 1956/05/02    ADMISSION DATE:  10/03/2014 CONSULTATION DATE:  10/06/2014  REFERRING MD :  Candiss Norse  CHIEF COMPLAINT:  Hypotension  INITIAL PRESENTATION:  59 y.o. M with multiple medical problems, who resides in SNF (due to paraplegia), brought to [facility] 3/8 for AMS thought to be multifactorial from meds, seizures, and UTI. During early AM hours 3/10, RN noted pt to be hypotensive (SBP in 90's down to 70's and later to 50's) and more altered and difficult to arouse.  Pt transferred to ICU for concern for need to intubate and for further management.   STUDIES:  CXR 3/10 >>> ATX CT head 3/8 >>> no acute process  SIGNIFICANT EVENTS: 3/8 - admit 3/10 - transferred to ICU for hypotension, PCCM consulted 3/11 Transfer to medical floor and back to [facility]  SUBJECTIVE: Agitated overnight but now calm.  VITAL SIGNS: Temp:  [97.5 F (36.4 C)-98.3 F (36.8 C)] 97.5 F (36.4 C) (03/11 0400) Pulse Rate:  [69-114] 95 (03/11 0900) Resp:  [14-34] 25 (03/11 0900) BP: (58-159)/(39-93) 104/60 mmHg (03/11 0900) SpO2:  [90 %-100 %] 96 % (03/11 0900) FiO2 (%):  [40 %-45 %] 40 % (03/10 1200)  HEMODYNAMICS: CVP:  [0 mmHg-17 mmHg] 10 mmHg  VENTILATOR SETTINGS: Vent Mode:  [-]  FiO2 (%):  [40 %-45 %] 40 %  INTAKE / OUTPUT: Intake/Output      03/10 0701 - 03/11 0700 03/11 0701 - 03/12 0700   P.O.     I.V. (mL/kg) 3053.6 (28.2) 250 (2.3)   Total Intake(mL/kg) 3053.6 (28.2) 250 (2.3)   Urine (mL/kg/hr) 2900 (1.1)    Total Output 2900     Net +153.6 +250         PHYSICAL EXAMINATION: General: Obese male, non-responsive, in NAD. Neuro: Alert and interactive.  Moves ext to command. HEENT: [place]/AT. PERRL, sclerae anicteric. Cardiovascular: RRR, no M/R/G appreciated with respect to body habitus. Lungs: Respirations shallow and unlabored.  CTA bilaterally.  BS distant. Abdomen: Prior surgical midline scar noted.  Colostomy bag  in place.  Abd soft, NT/ND.  Musculoskeletal: L AKA.  no edema.  Skin: Intact, warm, no rashes.  LABS:  CBC  Recent Labs Lab 10/04/14 0856 10/05/14 0119 10/05/14 0450  WBC 7.8 11.3* 9.2  HGB 9.7* 8.5* 8.4*  HCT 30.3* 26.6* 26.5*  PLT 548* 446* 466*   Coag's  Recent Labs Lab 10/04/14 0856 10/05/14 0549 10/06/14 0550  INR 1.49 2.05* 3.16*   BMET  Recent Labs Lab 10/04/14 0856 10/05/14 0119 10/05/14 0450  NA 138 135 137  K 3.7 3.4* 3.5  CL 106 104 109  CO2 20 21 22   BUN 16 20 18   CREATININE 1.56* 2.32* 2.07*  GLUCOSE 102* 128* 189*   Electrolytes  Recent Labs Lab 10/04/14 0856 10/05/14 0119 10/05/14 0450 10/05/14 0549  CALCIUM 8.9 8.1* 7.3*  --   MG  --   --   --  1.9  PHOS  --   --   --  4.0   Sepsis Markers  Recent Labs Lab 10/03/14 1656 10/05/14 0119 10/05/14 0420 10/05/14 0549 10/06/14 0601  LATICACIDVEN 1.62 1.3  --  1.5  --   PROCALCITON  --   --  9.32  --  2.63   ABG  Recent Labs Lab 10/03/14 1627 10/05/14 0120  PHART 7.364 7.359  PCO2ART 41.9 35.4  PO2ART 86.0 224.0*   Liver Enzymes  Recent Labs Lab 09/30/14  0455 10/03/14 1615 10/04/14 0856  AST 13 21 20   ALT 10 14 15   ALKPHOS 54 69 71  BILITOT 0.4 0.4 0.5  ALBUMIN 3.3* 3.4* 3.3*   Cardiac Enzymes  Recent Labs Lab 10/04/14 0506 10/04/14 0856 10/05/14 0420  TROPONINI <0.03 <0.03 <0.03   Glucose  Recent Labs Lab 10/05/14 1214 10/05/14 1515 10/05/14 2022 10/05/14 2315 10/06/14 0440 10/06/14 0803  GLUCAP 112* 166* 252* 165* 191* 112*   Imaging US Renal  10/05/2014   CLINICAL DATA:  Hydronephrosis, history hypertension, hyperlipidemia, neurogenic bladder, paraplegia  EXAM: RENAL/URINARY TRACT ULTRASOUND COMPLETE  COMPARISON:  09/29/2014  FINDINGS: Degradation of image quality secondary to body habitus.  Right Kidney:  Length: 12.2 cm. Grossly normal cortical thickness and echogenicity. No mass, hydronephrosis or shadowing calcification.  Left Kidney:   Length: 11.9 cm. Grossly normal cortical thickness. Suboptimal visualization of upper and lower poles due to body habitus. No gross evidence of mass or hydronephrosis. No shadowing calcification.  Bladder:  Decompressed by catheter, not assessed.  IMPRESSION: Suboptimal exam secondary to body habitus.  No gross evidence of renal mass or hydronephrosis.   Electronically Signed   By: Lavonia Dana M.D.   On: 10/05/2014 08:27   Dg Chest Port 1 View  10/05/2014   CLINICAL DATA:  Respiratory distress.  Central line placement.  EXAM: PORTABLE CHEST - 1 VIEW  COMPARISON:  10/03/2014  FINDINGS: Shallow inspiration with elevation of the right hemidiaphragm. Atelectasis in the lung bases. Borderline heart size with normal pulmonary vascularity. Interval placement of a right central venous catheter with tip overlying the low SVC region. No pneumothorax.  IMPRESSION: Right central venous catheter with tip over the low SVC. Shallow inspiration with atelectasis in the lung bases.   Electronically Signed   By: Lucienne Capers M.D.   On: 10/05/2014 02:33   ASSESSMENT / PLAN:  CARDIOVASCULAR CVL R IJ 3/10 >>> A:  Urosepsis - concern for developing shock, lactate reassuring. Hx HTN, HLD. P:  Goal MAP > 65. D/C pressors. CVP's with goal 8 - 12. Continue stress steroids. Hold metoprolol, hydralazine.  NEUROLOGIC A:   Acute metabolic encephalopathy Seizure disorder Hx paraplegia following spinal cord injury, insomnia, neurogenic bladder, bipolar affective d/o, insomnia, alcohol abuse, polysubstance abuse, chronic pain, phantom limb pain P:   Ammonia down to 15. Continue lamictal. Continue thiamine / folate / multivitamin. D/c baclofen, clonazepam, duloxetine, pregabalin, quetiapine. Avoid all sedating meds.  PULMONARY A: Acute hypoxic respiratory failure At risk intubation Atelectasis P:   Continue supplemental O2 as needed to maintain SpO2 > 92%. Pulmonary hygiene.  RENAL A:   Hypokalemia CKD P:   KVO IVF. BMP in AM. Replace electrolytes as indicated.  GASTROINTESTINAL A:   Hepatic encephalopathy HCV GERD Obesity Nutrition P:   Continue Lactulose. Pantoprazole. Diet as ordered.  HEMATOLOGIC A:   Chronic anemia VTE treatment P:  Transfuse per usual ICU guidelines. SCD's / Coumadin. Coumadin per pharmacy. CBC in AM.  INFECTIOUS A:   Urosepsis with concern for developing shock - was on ceftriaxone but given acute decline will broaden abx for now. P:   BCx2 3/10 >  UCx 3/10 > Abx: Vanc, start date 3/10, day 2/x. Abx: Zosyn, start date 3/10, day 2/x. PCT algorithm to limit abx exposure.  ENDOCRINE A:   At risk relative AI - has been on 10mg  prednisone daily   Hypothyroidism Hyperglycemia P:   Stress steroids. No role cortisol as has been on pred. Continue synthroid. SSI.  I have reviewed  the CXR personally, mild pulmonary edema noted, will d/c IVF.  I discussed case with RN and radiologist, will D/C IVF, would like to diureses but given BP will not at this time.  Family updated: Patient updated bedside, will transfer to floor and back to [facility] service, PCCM will sign off, please call back if needed.  Rush Farmer, M.D. [facility] Pulmonary/Critical Care Medicine. Pager: [phone]. After hours pager: [phone].

## 2014-10-06 NOTE — Progress Notes (Signed)
PT Cancellation Note  Patient Details Name: Samuel [name] MRN: [id number] DOB: June 07, 1956   Cancelled Treatment:    Reason Eval/Treat Not Completed: Pain limiting ability to participate;Other (comment) Rn requests to hold PT evaluation as pt just given pain medication and just fell asleep. Just restarted on psych meds. Will follow up next available time.   Candy Sledge A 10/06/2014, 3:15 PM  Candy Sledge, [place], DPT [phone]

## 2014-10-06 NOTE — Progress Notes (Signed)
[facility] Progress Note Patient Name: Samuel [name] DOB: June 04, 1956 MRN: [id number]   Date of Service  10/06/2014  HPI/Events of Note  Called because pt agitated, screaming out. Was sleeping soundly until now. His phenylephrine has been weaned to off. Received, clonazepam, seroquel earlier.   eICU Interventions  Ativan x 1 and serial doses haldol ordered; QT-c on ECG from 3/8 = 0.424     Intervention Category Major Interventions: Delirium, psychosis, severe agitation - evaluation and management  Jemarcus Dougal S. 10/06/2014, 4:31 AM

## 2014-10-06 NOTE — Progress Notes (Signed)
Speech Language Pathology Treatment: Dysphagia  Patient Details Name: Samuel [name] MRN: [id number] DOB: 05/03/1956 Today's Date: 10/06/2014 Time: 1045-1055 SLP Time Calculation (min) (ACUTE ONLY): 10 min  Assessment / Plan / Recommendation Clinical Impression  Skilled treatment session focused on addressing dysphagia goals.  SLP facilitated session by reducing distractions and encouraging patient to feed himself regular textures and thin liquids.  Patient required Min cues to not talk to SLP during consumption of regular textures and thin liquids via straw, which was effective at preventing overt s/s of aspiration.  Patient with timely swallow initiations with consecutive straw sips.  Goals met; as a result, no further skilled SLP services are warranted at this time.     HPI HPI: Samuel [name] is a 58 year old male who presents with altered mental status. Patient had presented to the hospital recently with shortness of breath and cough. He was found to have pneumonia that was treated. Recently discharged back to the nursing home on March 3 after about a 2 week stay. He had been doing relatively well but he was sent back from the nursing home due to altered mental status. He has no fevers noted. He is on multiple medications that could be causing an altered mentation including klonipin baclofen seroquel fentanyl and lyrica among others.    Pertinent Vitals Pain Assessment: No/denies pain  SLP Plan  All goals met    Recommendations Diet recommendations: Regular;Thin liquid Liquids provided via: Cup;Straw Medication Administration: Whole meds with liquid Supervision: Patient able to self feed;Intermittent supervision to cue for compensatory strategies (due to cognition ) Compensations: Slow rate;Small sips/bites;Check for pocketing Postural Changes and/or Swallow Maneuvers: Seated upright 90 degrees              Oral Care Recommendations: Oral care BID Follow up Recommendations:  None Plan: All goals met    GO     , M.A., CCC-SLP [phone]  , 10/06/2014, 11:06 AM    

## 2014-10-06 NOTE — Progress Notes (Signed)
ANTICOAGULATION CONSULT NOTE - Follow Up Consult  Pharmacy Consult for Coumadin/vanc/merrem Indication: PE history  Allergies  Allergen Reactions  . Tomato Other (See Comments)    Unknown reaction    Patient Measurements: Height: 5\' 5"  (165.1 cm) Weight: 238 lb 5.1 oz (108.1 kg) IBW/kg (Calculated) : 61.5  Labs:  Recent Labs  10/04/14 0506 10/04/14 0856 10/05/14 0119 10/05/14 0420 10/05/14 0450 10/05/14 0549 10/06/14 0550  HGB  --  9.7* 8.5*  --  8.4*  --   --   HCT  --  30.3* 26.6*  --  26.5*  --   --   PLT  --  548* 446*  --  466*  --   --   LABPROT  --  18.2*  --   --   --  23.3* 32.7*  INR  --  1.49  --   --   --  2.05* 3.16*  CREATININE  --  1.56* 2.32*  --  2.07*  --   --   TROPONINI <0.03 <0.03  --  <0.03  --   --   --     Estimated Creatinine Clearance: 44.6 mL/min (by C-G formula based on Cr of 2.07).  Assessment: 59 year old male continues on Coumadin for PE history admitted with AMS and now transferred today to 84M for sepsis on broad spectrum antibiotics. INR jumps to 3.16 today so we'll hold coumadin today. Blood cultures are still neg. PCT down to 2.63. Remains afebrile.Scr is down slightly.    Goal of Therapy:  INR 2-3 Monitor platelets by anticoagulation protocol: Yes   Plan:   No coumadin today Cont daily INR Change merrem to 1g IV q12  Onnie Boer, PharmD Pager: [phone] 10/06/2014 8:52 AM

## 2014-10-07 DIAGNOSIS — C688 Malignant neoplasm of overlapping sites of urinary organs: Secondary | ICD-10-CM

## 2014-10-07 DIAGNOSIS — G546 Phantom limb syndrome with pain: Secondary | ICD-10-CM

## 2014-10-07 DIAGNOSIS — R319 Hematuria, unspecified: Secondary | ICD-10-CM

## 2014-10-07 DIAGNOSIS — D62 Acute posthemorrhagic anemia: Secondary | ICD-10-CM

## 2014-10-07 DIAGNOSIS — C689 Malignant neoplasm of urinary organ, unspecified: Secondary | ICD-10-CM | POA: Diagnosis present

## 2014-10-07 LAB — PROCALCITONIN: Procalcitonin: 0.99 ng/mL

## 2014-10-07 LAB — BASIC METABOLIC PANEL
Anion gap: 7 (ref 5–15)
BUN: 7 mg/dL (ref 6–23)
CALCIUM: 8.1 mg/dL — AB (ref 8.4–10.5)
CHLORIDE: 109 mmol/L (ref 96–112)
CO2: 24 mmol/L (ref 19–32)
CREATININE: 1.17 mg/dL (ref 0.50–1.35)
GFR calc Af Amer: 78 mL/min — ABNORMAL LOW (ref >90)
GFR calc non Af Amer: 68 mL/min — ABNORMAL LOW (ref >90)
Glucose, Bld: 127 mg/dL — ABNORMAL HIGH (ref 70–99)
Potassium: 2.5 mmol/L — CL (ref 3.5–5.1)
Sodium: 140 mmol/L (ref 135–145)

## 2014-10-07 LAB — CBC
HCT: 24.5 % — ABNORMAL LOW (ref 39.0–52.0)
Hemoglobin: 7.9 g/dL — ABNORMAL LOW (ref 13.0–17.0)
MCH: 26.2 pg (ref 26.0–34.0)
MCHC: 32.2 g/dL (ref 30.0–36.0)
MCV: 81.4 fL (ref 78.0–100.0)
Platelets: 416 10*3/uL — ABNORMAL HIGH (ref 150–400)
RBC: 3.01 MIL/uL — ABNORMAL LOW (ref 4.22–5.81)
RDW: 15.2 % (ref 11.5–15.5)
WBC: 9.1 10*3/uL (ref 4.0–10.5)

## 2014-10-07 LAB — GLUCOSE, CAPILLARY
GLUCOSE-CAPILLARY: 134 mg/dL — AB (ref 70–99)
Glucose-Capillary: 113 mg/dL — ABNORMAL HIGH (ref 70–99)
Glucose-Capillary: 139 mg/dL — ABNORMAL HIGH (ref 70–99)
Glucose-Capillary: 82 mg/dL (ref 70–99)

## 2014-10-07 LAB — URINE CULTURE: Colony Count: >=100000

## 2014-10-07 LAB — PROTIME-INR
INR: 2.69 — ABNORMAL HIGH (ref 0.00–1.49)
Prothrombin Time: 28.8 seconds — ABNORMAL HIGH (ref 11.6–15.2)

## 2014-10-07 LAB — PREPARE RBC (CROSSMATCH)

## 2014-10-07 LAB — MAGNESIUM: Magnesium: 1.5 mg/dL (ref 1.5–2.5)

## 2014-10-07 LAB — PHOSPHORUS: Phosphorus: 2.5 mg/dL (ref 2.3–4.6)

## 2014-10-07 MED ORDER — BENZONATATE 100 MG PO CAPS
100.0000 mg | ORAL_CAPSULE | Freq: Three times a day (TID) | ORAL | Status: DC | PRN
  Administered 2014-10-07 – 2014-10-09 (×5): 100 mg via ORAL
  Filled 2014-10-07 (×5): qty 1

## 2014-10-07 MED ORDER — POTASSIUM CHLORIDE CRYS ER 20 MEQ PO TBCR
40.0000 meq | EXTENDED_RELEASE_TABLET | ORAL | Status: AC
  Administered 2014-10-07 (×2): 40 meq via ORAL
  Filled 2014-10-07 (×2): qty 2

## 2014-10-07 MED ORDER — PREGABALIN 75 MG PO CAPS
75.0000 mg | ORAL_CAPSULE | Freq: Two times a day (BID) | ORAL | Status: DC
  Administered 2014-10-07 – 2014-10-09 (×5): 75 mg via ORAL
  Filled 2014-10-07 (×5): qty 1

## 2014-10-07 MED ORDER — PHENOL 1.4 % MT LIQD
1.0000 | OROMUCOSAL | Status: DC | PRN
  Filled 2014-10-07: qty 177

## 2014-10-07 MED ORDER — SODIUM CHLORIDE 0.9 % IV SOLN
Freq: Once | INTRAVENOUS | Status: AC
  Administered 2014-10-07: 11:00:00 via INTRAVENOUS

## 2014-10-07 MED ORDER — SODIUM CHLORIDE 0.9 % IJ SOLN
10.0000 mL | INTRAMUSCULAR | Status: DC | PRN
  Administered 2014-10-08 – 2014-10-09 (×2): 20 mL
  Filled 2014-10-07 (×2): qty 40

## 2014-10-07 MED ORDER — VANCOMYCIN HCL 10 G IV SOLR
1250.0000 mg | INTRAVENOUS | Status: DC
  Administered 2014-10-08: 1250 mg via INTRAVENOUS
  Filled 2014-10-07: qty 1250

## 2014-10-07 MED ORDER — POTASSIUM CHLORIDE 10 MEQ/50ML IV SOLN
10.0000 meq | INTRAVENOUS | Status: AC
  Administered 2014-10-07 (×2): 10 meq via INTRAVENOUS
  Filled 2014-10-07 (×3): qty 50

## 2014-10-07 MED ORDER — SODIUM CHLORIDE 0.9 % IV SOLN
1.0000 g | Freq: Three times a day (TID) | INTRAVENOUS | Status: DC
  Administered 2014-10-07 – 2014-10-08 (×3): 1 g via INTRAVENOUS
  Filled 2014-10-07 (×6): qty 1

## 2014-10-07 MED ORDER — WARFARIN SODIUM 5 MG PO TABS
5.0000 mg | ORAL_TABLET | Freq: Once | ORAL | Status: AC
  Administered 2014-10-07: 5 mg via ORAL
  Filled 2014-10-07: qty 1

## 2014-10-07 NOTE — Progress Notes (Addendum)
PROGRESS NOTE  Samuel [name] NWG:[id number] DOB: 14-Jan-1956 DOA: 10/03/2014 PCP: [name], MD  HPI: 59 y.o. M with multiple medical problems, who resides in SNF (due to paraplegia), brought to [facility] 3/8 for AMS thought to be multifactorial from meds, seizures, and UTI. During early AM hours 3/10, RN noted pt to be hypotensive (SBP in 90's down to 70's and later to 50's) and more altered and difficult to arouse. Pt transferred to ICU temporarily, back to [facility] service 3/12  Subjective / 24 H Interval events - feeling "great", no complaints other than his chronic phantom pain LLE, wants his Lyrica back   Assessment/Plan: Active Problems:   Hepatitis C   Hypokalemia   Encephalopathy acute   HTN (hypertension)   Acute blood loss anemia   Pulmonary emboli   Hematuria   Acute encephalopathy   Altered mental state   AKI (acute kidney injury)   Altered mental status   Renal insufficiency   Urothelial cancer   Sepsis of urinary origin  - improving, prior microbiology with highly resistant bacteria, currently on vancomycin and Meropenem - awaiting urine culture - renal US 3/10 without hydro  Hematuria with recent bilateral hydronephrosis  - s/p TURBT 3/1 with ureteral stent by Dr. [name]  - repeat renal US 3/10 without hydronephrosis  - still with hematuria in the bag although per patient much improved - will call urology if gets worse  High grade urothelial cancer - seen by Dr. [name] beginning of this month when he was hospitalized at [facility] for perhaps chemotherapy, to be or has been discussed already at tumor board.   Acute metabolic encephalopathy - due to #1, home medications - resolved with antibiotics - ?hepatic encephalopathy with elevated ammonia, history of HCV, with hepatic steatosis on most recent abdominal CT. Continue Lactulose.   ?Adrenal insufficiency  - profound hypotension in the setting of recent prednisone - currently on stress dose  steroids, 100 mg every 8 hours - de-escalate tomorrow  Seizure disorder - continue Lamictal   Hx paraplegia following spinal cord injury  Phantom limb pain - continue Lyrica  Acute blood loss Anemia  - also in the setting of chronic medical problems and ongoing hematuria, sepsis, etc - Hb slowly trending down, transfuse 1U 3/12  Acute hypoxic respiratory failure - in the setting of #2, now imrpoved  History of PE after left hip surgery Feb 2015 - history of massive PE, needing life long anticoagulation  - on Coumadin  Hypothyroidism - continue synthroid  Hypokalemia  - ongoing need for repletion   Diet: Diet Heart Fluids: none  DVT Prophylaxis: Couamdin  Code Status: Full Code Family Communication: d/w patient   Disposition Plan: remain inpatient  Consultants:  None   Procedures:  None    Antibiotics Vancomycin 3/10 >> Meropenem 3/10 >>   Studies  No results found.  Objective  Filed Vitals:   10/07/14 0500 10/07/14 0850 10/07/14 1206 10/07/14 1228  BP: 166/83  160/90 147/86  Pulse: 99  99 93  Temp: 98.1 F (36.7 C)  98.4 F (36.9 C) 98.6 F (37 C)  TempSrc: Oral  Oral Oral  Resp: 19  18 18   Height:      Weight:      SpO2: 100% 95% 92% 94%    Intake/Output Summary (Last 24 hours) at 10/07/14 1316 Last data filed at 10/07/14 1213  Gross per 24 hour  Intake    515 ml  Output   2775 ml  Net  -  2260 ml   Filed Weights   10/03/14 2044 10/04/14 0517  Weight: 108.3 kg (238 lb 12.1 oz) 108.1 kg (238 lb 5.1 oz)   Exam:  General:  NAD, pleasant  HEENT: no scleral icterus  Cardiovascular: RRR, no RLE edema  Respiratory: CTA biL  Abdomen: soft, non tender, ostomy in place  MSK/Extremities: no cyanosis,   Skin: no rashes  Neuro: non focal   Data Reviewed: Basic Metabolic Panel:  Recent Labs Lab 10/03/14 1615 10/03/14 2156 10/04/14 0856 10/05/14 0119 10/05/14 0450 10/05/14 0549 10/07/14 0615  NA 138  --  138 135 137  --   140  K 4.8  --  3.7 3.4* 3.5  --  2.5*  CL 105  --  106 104 109  --  109  CO2 20  --  20 21 22   --  24  GLUCOSE 105*  --  102* 128* 189*  --  127*  BUN 18  --  16 20 18   --  7  CREATININE 1.75* 1.64* 1.56* 2.32* 2.07*  --  1.17  CALCIUM 8.9  --  8.9 8.1* 7.3*  --  8.1*  MG  --   --   --   --   --  1.9 1.5  PHOS  --   --   --   --   --  4.0 2.5   Liver Function Tests:  Recent Labs Lab 10/03/14 1615 10/04/14 0856  AST 21 20  ALT 14 15  ALKPHOS 69 71  BILITOT 0.4 0.5  PROT 7.4 7.4  ALBUMIN 3.4* 3.3*    Recent Labs Lab 10/03/14 1615 10/05/14 0445  AMMONIA 73* 15   CBC:  Recent Labs Lab 10/03/14 1615 10/03/14 2156 10/04/14 0856 10/05/14 0119 10/05/14 0450 10/07/14 0615  WBC 6.6 5.9 7.8 11.3* 9.2 9.1  NEUTROABS 5.0  --   --   --   --   --   HGB 9.2* 9.0* 9.7* 8.5* 8.4* 7.9*  HCT 29.2* 28.7* 30.3* 26.6* 26.5* 24.5*  MCV 85.4 83.4 83.9 83.9 83.6 81.4  PLT 535* 554* 548* 446* 466* 416*   Cardiac Enzymes:  Recent Labs Lab 10/03/14 1615 10/03/14 2156 10/04/14 0506 10/04/14 0856 10/05/14 0420  TROPONINI <0.03 <0.03 <0.03 <0.03 <0.03   BNP (last 3 results)  Recent Labs  09/17/14 1651  BNP 41.0    ProBNP (last 3 results)  Recent Labs  12/07/13 2221  PROBNP 56.9    CBG:  Recent Labs Lab 10/06/14 0803 10/06/14 1141 10/06/14 1710 10/06/14 2149 10/07/14 0801  GLUCAP 112* 113* 130* 114* 113*    Recent Results (from the past 240 hour(s))  Culture, blood (routine x 2)     Status: None   Collection Time: 09/29/14  9:45 AM  Result Value Ref Range Status   Specimen Description BLOOD LEFT HAND  Final   Special Requests BOTTLES DRAWN AEROBIC ONLY 3CC  Final   Culture   Final    NO GROWTH 5 DAYS Performed at [facility]    Report Status 10/05/2014 FINAL  Final  Culture, blood (routine x 2)     Status: None   Collection Time: 09/29/14  9:55 AM  Result Value Ref Range Status   Specimen Description BLOOD LEFT ARM  Final   Special  Requests BOTTLES DRAWN AEROBIC ONLY 5CC  Final   Culture   Final    NO GROWTH 5 DAYS Performed at [facility]    Report Status 10/05/2014 FINAL  Final  Urine culture     Status: None   Collection Time: 09/29/14 11:49 AM  Result Value Ref Range Status   Specimen Description URINE, CATHETERIZED  Final   Special Requests NONE  Final   Colony Count NO GROWTH Performed at [facility]   Final   Culture NO GROWTH Performed at [facility]   Final   Report Status 09/30/2014 FINAL  Final  Culture, blood (routine x 2)     Status: None (Preliminary result)   Collection Time: 10/05/14  1:19 AM  Result Value Ref Range Status   Specimen Description BLOOD RIGHT HAND  Final   Special Requests BOTTLES DRAWN AEROBIC ONLY 10CC  Final   Culture   Final           BLOOD CULTURE RECEIVED NO GROWTH TO DATE CULTURE WILL BE HELD FOR 5 DAYS BEFORE ISSUING A FINAL NEGATIVE REPORT Note: Culture results may be compromised due to an excessive volume of blood received in culture bottles. Performed at [facility]    Report Status PENDING  Incomplete  Culture, blood (routine x 2)     Status: None (Preliminary result)   Collection Time: 10/05/14  1:25 AM  Result Value Ref Range Status   Specimen Description BLOOD LEFT HAND  Final   Special Requests BOTTLES DRAWN AEROBIC ONLY 10CC  Final   Culture   Final           BLOOD CULTURE RECEIVED NO GROWTH TO DATE CULTURE WILL BE HELD FOR 5 DAYS BEFORE ISSUING A FINAL NEGATIVE REPORT Performed at [facility]    Report Status PENDING  Incomplete  Culture, Urine     Status: None (Preliminary result)   Collection Time: 10/05/14  2:32 AM  Result Value Ref Range Status   Specimen Description URINE, CATHETERIZED  Final   Special Requests NONE  Final   Colony Count   Final    >=100,000 COLONIES/ML Performed at [facility]    Culture   Final    [place] Performed at [facility]    Report Status  PENDING  Incomplete     Scheduled Meds: . aspirin EC  325 mg Oral Daily  . atorvastatin  10 mg Oral QHS  . clonazePAM  0.5 mg Oral QHS  . DULoxetine  60 mg Oral QHS  . feeding supplement (ENSURE COMPLETE)  237 mL Oral BID BM  . ferrous sulfate  325 mg Oral BID WC  . folic acid  1 mg Oral Daily  . hydrocortisone sodium succinate  100 mg Intravenous Q8H  . insulin aspart  0-15 Units Subcutaneous TID AC & HS  . lactulose  20 g Oral QID  . lamoTRIgine  200 mg Oral BID  . levothyroxine  125 mcg Oral QAC breakfast  . meropenem (MERREM) IV  1 g Intravenous Q8H  . multivitamin with minerals  1 tablet Oral Daily  . oxybutynin  5 mg Oral Daily  . pantoprazole  40 mg Oral Daily  . polyvinyl alcohol  2 drop Both Eyes TID  . potassium chloride  40 mEq Oral Q3H  . pregabalin  75 mg Oral BID  . QUEtiapine  200 mg Oral BID  . senna  2 tablet Oral Daily  . sodium chloride  3 mL Intravenous Q12H  . thiamine  100 mg Oral Daily  . [START ON 10/08/2014] vancomycin  1,250 mg Intravenous Q24H  . vitamin B-12  1,000 mcg Oral QODAY  .  warfarin  5 mg Oral ONCE-1800  . Warfarin - Pharmacist Dosing Inpatient   Does not apply q1800   Continuous Infusions: . phenylephrine (NEO-SYNEPHRINE) Adult infusion Stopped (10/05/14 1200)    Marzetta Board, MD [facility] Pager [phone]. If 7 PM - 7 AM, please contact night-coverage at [email or web address], password [facility] 10/07/2014, 1:16 PM  LOS: 4 days

## 2014-10-07 NOTE — Evaluation (Signed)
Physical Therapy Evaluation Patient Details Name: Samuel [name] MRN: [id number] DOB: Dec 24, 1955 Today's Date: 10/07/2014   History of Present Illness  59 yo male with onset of AMS, with UTI and AKI, PNA.  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  He is a resident of SNF and has been able to transition to chair as a paraplegic with trapeze and staff, but too dizzy to attempt. He is receiving blood and may be better able tomorrow to try more independent sitting activity.  Will go back to his SNF.    Follow Up Recommendations SNF    Equipment Recommendations  None recommended by PT    Recommendations for Other Services       Precautions / Restrictions Precautions Precautions: Fall Precaution Comments: pt uses trapeze with staff at SNF to get up in chair Restrictions Weight Bearing Restrictions: No      Mobility  Bed Mobility Overal bed mobility: Needs Assistance Bed Mobility: Supine to Sit;Rolling Rolling: Mod assist   Supine to sit: Max assist;+2 for safety/equipment     General bed mobility comments: Pt is accustomed to having help for all mobility  Transfers Overall transfer level: Needs assistance               General transfer comment: could not tolerate due to dizziness and PT worked on sitting instead  Ambulation/Gait             General Gait Details: unable  Financial trader Rankin (Stroke Patients Only)       Balance Overall balance assessment: Needs assistance Sitting-balance support: Bilateral upper extremity supported Sitting balance-Leahy Scale: Poor                                       Pertinent Vitals/Pain Pain Assessment: No/denies pain    Home Living Family/patient expects to be discharged to:: Skilled nursing facility Living  Arrangements: Other (Comment) [facility])                    Prior Function Level of Independence: Needs assistance   Gait / Transfers Assistance Needed: transfers with trapeze and staff members  ADL's / Homemaking Assistance Needed: SNF resident        Hand Dominance        Extremity/Trunk Assessment   Upper Extremity Assessment: Generalized weakness           Lower Extremity Assessment: Generalized weakness      Cervical / Trunk Assessment: Normal  Communication   Communication: No difficulties  Cognition Arousal/Alertness: Awake/alert Behavior During Therapy: WFL for tasks assessed/performed Overall Cognitive Status: Within Functional Limits for tasks assessed                      General Comments General comments (skin integrity, edema, etc.): Pt is dizzy and receiving blood when PT arrived. Due to his situation and medical issues, worked bedside only    Exercises        Assessment/Plan    PT Assessment Patient needs continued PT services  PT Diagnosis Generalized weakness   PT Problem List Decreased strength;Decreased range of motion;Decreased activity tolerance;Decreased balance;Decreased mobility;Cardiopulmonary status limiting activity;Obesity;Decreased skin integrity  PT Treatment Interventions Functional mobility training;Therapeutic activities;Therapeutic exercise;Balance training;Neuromuscular re-education;Patient/family education   PT Goals (Current goals can be found in the Care Plan section) Acute Rehab PT Goals Patient Stated Goal: none stated PT Goal Formulation: With patient Time For Goal Achievement: 10/21/14 Potential to Achieve Goals: Good    Frequency Min 2X/week   Barriers to discharge   can go back to SNF when medically ready    Co-evaluation               End of Session   Activity Tolerance: Patient tolerated treatment well;Patient limited by lethargy Patient left: in bed;with call bell/phone within  reach;with bed alarm set Nurse Communication: Mobility status         Time: 1414-1440 PT Time Calculation (min) (ACUTE ONLY): 26 min   Charges:   PT Evaluation $Initial PT Evaluation Tier I: 1 Procedure PT Treatments $Therapeutic Activity: 8-22 mins   PT G Codes:        Ramond Dial 2014-10-20, 4:36 PM  Mee Hives, PT MS Acute Rehab Dept. Number: [phone]

## 2014-10-07 NOTE — Progress Notes (Signed)
ANTICOAGULATION + ANTIBIOTIC CONSULT NOTE - Follow Up Consult  Pharmacy Consult for Coumadin; vancomycin + meropenem Indication: PE history; sepsis  Allergies  Allergen Reactions  . Tomato Other (See Comments)    Unknown reaction    Patient Measurements: Height: 5\' 5"  (165.1 cm) Weight: 238 lb 5.1 oz (108.1 kg) IBW/kg (Calculated) : 61.5  Labs:  Recent Labs  10/05/14 0119 10/05/14 0420 10/05/14 0450 10/05/14 0549 10/06/14 0550 10/07/14 0615  HGB 8.5*  --  8.4*  --   --  7.9*  HCT 26.6*  --  26.5*  --   --  24.5*  PLT 446*  --  466*  --   --  416*  LABPROT  --   --   --  23.3* 32.7* 28.8*  INR  --   --   --  2.05* 3.16* 2.69*  CREATININE 2.32*  --  2.07*  --   --  1.17  TROPONINI  --  <0.03  --   --   --   --     Estimated Creatinine Clearance: 78.9 mL/min (by C-G formula based on Cr of 1.17).  Assessment: 59 year old male continues on Coumadin for hx PE. INR is now therapeutic at 2.69 after a held dose yesterday. No bleeding noted. H/H down slightly and platelets are elevated.   Pt is also on D#3 for vancomycin + meropenem for sepsis. Pt is afebrile and WBC is WNL. SCr is trending down but still above baseline. Renal fxn is difficult to assess with history of paraplegia. Know history of MDR organisms.   Vanc 3/10> Meropenem 3/10>  3/10 Urine - >100K GNR 3/10 Blood - NGTD  Goal of Therapy:  INR 2-3 Vanc trough 15-20   Plan:  - Warfarin 5mg  PO x 1 tonight - F/u AM INR - Change vancomycin to 1250mg  IV Q24H - consider DC as pt is growing gram negative organism - Change meropenem to 1gm IV Q8H - F/u renal fxn, C&S, clinical status and trough at [facility], PharmD, BCPS Pager # [phone] 10/07/2014 10:49 AM

## 2014-10-08 DIAGNOSIS — B961 Klebsiella pneumoniae [K. pneumoniae] as the cause of diseases classified elsewhere: Secondary | ICD-10-CM

## 2014-10-08 DIAGNOSIS — C679 Malignant neoplasm of bladder, unspecified: Secondary | ICD-10-CM

## 2014-10-08 DIAGNOSIS — Z1624 Resistance to multiple antibiotics: Secondary | ICD-10-CM

## 2014-10-08 DIAGNOSIS — G8220 Paraplegia, unspecified: Secondary | ICD-10-CM

## 2014-10-08 DIAGNOSIS — N319 Neuromuscular dysfunction of bladder, unspecified: Secondary | ICD-10-CM

## 2014-10-08 DIAGNOSIS — Z1612 Extended spectrum beta lactamase (ESBL) resistance: Secondary | ICD-10-CM

## 2014-10-08 DIAGNOSIS — Z960 Presence of urogenital implants: Secondary | ICD-10-CM

## 2014-10-08 DIAGNOSIS — A498 Other bacterial infections of unspecified site: Secondary | ICD-10-CM

## 2014-10-08 DIAGNOSIS — F319 Bipolar disorder, unspecified: Secondary | ICD-10-CM

## 2014-10-08 DIAGNOSIS — E876 Hypokalemia: Secondary | ICD-10-CM

## 2014-10-08 DIAGNOSIS — Z89612 Acquired absence of left leg above knee: Secondary | ICD-10-CM

## 2014-10-08 DIAGNOSIS — N390 Urinary tract infection, site not specified: Secondary | ICD-10-CM

## 2014-10-08 DIAGNOSIS — Z933 Colostomy status: Secondary | ICD-10-CM

## 2014-10-08 LAB — COMPREHENSIVE METABOLIC PANEL
ALT: 21 U/L (ref 0–53)
ANION GAP: 7 (ref 5–15)
AST: 21 U/L (ref 0–37)
Albumin: 2.6 g/dL — ABNORMAL LOW (ref 3.5–5.2)
Alkaline Phosphatase: 97 U/L (ref 39–117)
BUN: 5 mg/dL — AB (ref 6–23)
CO2: 24 mmol/L (ref 19–32)
Calcium: 7.8 mg/dL — ABNORMAL LOW (ref 8.4–10.5)
Chloride: 109 mmol/L (ref 96–112)
Creatinine, Ser: 1.05 mg/dL (ref 0.50–1.35)
GFR calc Af Amer: 89 mL/min — ABNORMAL LOW (ref >90)
GFR calc non Af Amer: 77 mL/min — ABNORMAL LOW (ref >90)
Glucose, Bld: 104 mg/dL — ABNORMAL HIGH (ref 70–99)
Potassium: 2.5 mmol/L — CL (ref 3.5–5.1)
Sodium: 140 mmol/L (ref 135–145)
TOTAL PROTEIN: 5.7 g/dL — AB (ref 6.0–8.3)
Total Bilirubin: 0.5 mg/dL (ref 0.3–1.2)

## 2014-10-08 LAB — CBC
HEMATOCRIT: 25.8 % — AB (ref 39.0–52.0)
HEMOGLOBIN: 8.6 g/dL — AB (ref 13.0–17.0)
MCH: 26.5 pg (ref 26.0–34.0)
MCHC: 33.3 g/dL (ref 30.0–36.0)
MCV: 79.6 fL (ref 78.0–100.0)
PLATELETS: 367 10*3/uL (ref 150–400)
RBC: 3.24 MIL/uL — AB (ref 4.22–5.81)
RDW: 15.1 % (ref 11.5–15.5)
WBC: 7.7 10*3/uL (ref 4.0–10.5)

## 2014-10-08 LAB — PROTIME-INR
INR: 3.03 — ABNORMAL HIGH (ref 0.00–1.49)
Prothrombin Time: 31.7 seconds — ABNORMAL HIGH (ref 11.6–15.2)

## 2014-10-08 LAB — TYPE AND SCREEN
ABO/RH(D): O POS
ANTIBODY SCREEN: NEGATIVE
UNIT DIVISION: 0

## 2014-10-08 LAB — POTASSIUM: POTASSIUM: 2.6 mmol/L — AB (ref 3.5–5.1)

## 2014-10-08 LAB — GLUCOSE, CAPILLARY
Glucose-Capillary: 102 mg/dL — ABNORMAL HIGH (ref 70–99)
Glucose-Capillary: 103 mg/dL — ABNORMAL HIGH (ref 70–99)
Glucose-Capillary: 146 mg/dL — ABNORMAL HIGH (ref 70–99)
Glucose-Capillary: 109 mg/dL — ABNORMAL HIGH (ref 70–99)

## 2014-10-08 MED ORDER — POTASSIUM CHLORIDE 10 MEQ/50ML IV SOLN
10.0000 meq | INTRAVENOUS | Status: AC
  Administered 2014-10-08 (×4): 10 meq via INTRAVENOUS
  Filled 2014-10-08 (×5): qty 50

## 2014-10-08 MED ORDER — HYDROCORTISONE NA SUCCINATE PF 100 MG IJ SOLR
50.0000 mg | Freq: Two times a day (BID) | INTRAMUSCULAR | Status: DC
  Administered 2014-10-08 (×2): 50 mg via INTRAVENOUS
  Filled 2014-10-08: qty 2

## 2014-10-08 MED ORDER — ASPIRIN EC 81 MG PO TBEC
81.0000 mg | DELAYED_RELEASE_TABLET | Freq: Every day | ORAL | Status: DC
  Administered 2014-10-08 – 2014-10-09 (×2): 81 mg via ORAL
  Filled 2014-10-08 (×2): qty 1

## 2014-10-08 MED ORDER — POTASSIUM CHLORIDE CRYS ER 20 MEQ PO TBCR
40.0000 meq | EXTENDED_RELEASE_TABLET | ORAL | Status: AC
  Administered 2014-10-08 (×2): 40 meq via ORAL
  Filled 2014-10-08 (×2): qty 4

## 2014-10-08 MED ORDER — POTASSIUM CHLORIDE CRYS ER 20 MEQ PO TBCR
40.0000 meq | EXTENDED_RELEASE_TABLET | ORAL | Status: AC
  Administered 2014-10-08 (×3): 40 meq via ORAL
  Filled 2014-10-08 (×3): qty 2

## 2014-10-08 MED ORDER — POTASSIUM CHLORIDE 10 MEQ/50ML IV SOLN
10.0000 meq | INTRAVENOUS | Status: AC
  Administered 2014-10-08 (×5): 10 meq via INTRAVENOUS
  Filled 2014-10-08 (×6): qty 50

## 2014-10-08 MED ORDER — LACTULOSE 10 GM/15ML PO SOLN
20.0000 g | Freq: Every day | ORAL | Status: DC
  Filled 2014-10-08: qty 30

## 2014-10-08 MED ORDER — MAGNESIUM SULFATE 2 GM/50ML IV SOLN
2.0000 g | Freq: Once | INTRAVENOUS | Status: AC
  Administered 2014-10-08: 2 g via INTRAVENOUS
  Filled 2014-10-08: qty 50

## 2014-10-08 MED ORDER — WARFARIN SODIUM 1 MG PO TABS
1.0000 mg | ORAL_TABLET | Freq: Once | ORAL | Status: AC
  Administered 2014-10-08: 1 mg via ORAL
  Filled 2014-10-08: qty 1

## 2014-10-08 NOTE — Progress Notes (Signed)
PROGRESS NOTE  Samuel [name] NWG:[id number] DOB: 1956/05/06 DOA: 10/03/2014 PCP: [name], MD  HPI: 59 y.o. M with multiple medical problems, who resides in SNF (due to paraplegia), brought to [facility] 3/8 for AMS thought to be multifactorial from meds, seizures, and UTI. During early AM hours 3/10, RN noted pt to be hypotensive (SBP in 90's down to 70's and later to 50's) and more altered and difficult to arouse. Pt transferred to ICU temporarily, back to [facility] service 3/12  Subjective / 24 H Interval events - feeling better, asking to be discharged as soon as possible   Assessment/Plan: Active Problems:   Hepatitis C   Hypokalemia   Encephalopathy acute   HTN (hypertension)   Acute blood loss anemia   Pulmonary emboli   Hematuria   Acute encephalopathy   Altered mental state   AKI (acute kidney injury)   Altered mental status   Renal insufficiency   Urothelial cancer  Sepsis of urinary origin / ESBL UTI vs ESBL bacteriuria - improving, prior microbiology with highly resistant bacteria,  - initially on Vancomycin and Meropenem, urine cultures with ESBL Klebsiella, discontinued Vancomycin - renal US 3/10 without hydronephrosis  - he is likely always to have positive cultures, however appeared septic on admission. I have asked ID's input in his care, appreciate assistance.   Hematuria with recent bilateral hydronephrosis  - s/p TURBT 3/1 with ureteral stent by Dr. [name]  - repeat renal US 3/10 without hydronephrosis  - still with hematuria in the bag although per patient much improved  High grade urothelial cancer - seen by Dr. [name] beginning of this month when he was hospitalized at [facility] for perhaps chemotherapy in the future, to be or has been discussed already at tumor board.  - will plan on discussing his case with Dr. [name] tomorrow morning  Acute metabolic encephalopathy - due to #1, home medications - resolved with antibiotics -  ?hepatic encephalopathy with elevated ammonia, with hepatic steatosis on most recent abdominal CT. Continue Lactulose.   ?Adrenal insufficiency with hypotension on admission  - profound hypotension in the setting of recent prednisone - placed on stress dose steroids, 100 mg every 8 hours, weaning off to 50 Q12 on 3/13 - de-escalate to prednisone soon, probably needs a prolonged taper  Seizure disorder - continue Lamictal   Hx paraplegia following spinal cord injury  Phantom limb pain - continue Lyrica  Acute blood loss Anemia  - also in the setting of chronic medical problems and ongoing hematuria, sepsis, etc - Hb slowly trending down, transfuse 1U 3/12, responded appropriately  Acute hypoxic respiratory failure - improved, CXR unremarkable  History of PE after left hip surgery Feb 2015 - history of massive PE, needing life long anticoagulation  - on Coumadin  Hypothyroidism - continue synthroid  Hypokalemia  - ongoing need for repletion, decrease Lactulose today as he is having loose stools, replete Mag as well.  Diet: Diet Heart Fluids: none  DVT Prophylaxis: Couamdin  Code Status: Full Code Family Communication: d/w patient   Disposition Plan: remain inpatient  Consultants:  None   Procedures:  None    Antibiotics Vancomycin 3/10 >> 3/13 Meropenem 3/10 >>   Studies  No results found.  Objective  Filed Vitals:   10/07/14 1521 10/07/14 1813 10/07/14 2117 10/08/14 0612  BP: 178/90  142/74 170/78  Pulse: 85  84 77  Temp: 98.2 F (36.8 C)  99.4 F (37.4 C) 98.7 F (37.1 C)  TempSrc: Oral  Oral   Resp: 18  18 18   Height:      Weight:      SpO2: 98% 95% 94% 93%    Intake/Output Summary (Last 24 hours) at 10/08/14 1008 Last data filed at 10/08/14 0615  Gross per 24 hour  Intake    850 ml  Output   3900 ml  Net  -3050 ml   Filed Weights   10/03/14 2044 10/04/14 0517  Weight: 108.3 kg (238 lb 12.1 oz) 108.1 kg (238 lb 5.1 oz)    Exam:  General:  NAD, pleasant  HEENT: no scleral icterus  Cardiovascular: RRR, no RLE edema  Respiratory: CTA biL  Abdomen: soft, non tender, ostomy in place  MSK/Extremities: no cyanosis,   Skin: no rashes  Neuro: non focal   Data Reviewed: Basic Metabolic Panel:  Recent Labs Lab 10/04/14 0856 10/05/14 0119 10/05/14 0450 10/05/14 0549 10/07/14 0615 10/08/14 0504  NA 138 135 137  --  140 140  K 3.7 3.4* 3.5  --  2.5* 2.5*  CL 106 104 109  --  109 109  CO2 20 21 22   --  24 24  GLUCOSE 102* 128* 189*  --  127* 104*  BUN 16 20 18   --  7 5*  CREATININE 1.56* 2.32* 2.07*  --  1.17 1.05  CALCIUM 8.9 8.1* 7.3*  --  8.1* 7.8*  MG  --   --   --  1.9 1.5  --   PHOS  --   --   --  4.0 2.5  --    Liver Function Tests:  Recent Labs Lab 10/03/14 1615 10/04/14 0856 10/08/14 0504  AST 21 20 21   ALT 14 15 21   ALKPHOS 69 71 97  BILITOT 0.4 0.5 0.5  PROT 7.4 7.4 5.7*  ALBUMIN 3.4* 3.3* 2.6*    Recent Labs Lab 10/03/14 1615 10/05/14 0445  AMMONIA 73* 15   CBC:  Recent Labs Lab 10/03/14 1615  10/04/14 0856 10/05/14 0119 10/05/14 0450 10/07/14 0615 10/08/14 0504  WBC 6.6  < > 7.8 11.3* 9.2 9.1 7.7  NEUTROABS 5.0  --   --   --   --   --   --   HGB 9.2*  < > 9.7* 8.5* 8.4* 7.9* 8.6*  HCT 29.2*  < > 30.3* 26.6* 26.5* 24.5* 25.8*  MCV 85.4  < > 83.9 83.9 83.6 81.4 79.6  PLT 535*  < > 548* 446* 466* 416* 367  < > = values in this interval not displayed. Cardiac Enzymes:  Recent Labs Lab 10/03/14 1615 10/03/14 2156 10/04/14 0506 10/04/14 0856 10/05/14 0420  TROPONINI <0.03 <0.03 <0.03 <0.03 <0.03   BNP (last 3 results)  Recent Labs  09/17/14 1651  BNP 41.0    ProBNP (last 3 results)  Recent Labs  12/07/13 2221  PROBNP 56.9    CBG:  Recent Labs Lab 10/07/14 0801 10/07/14 1131 10/07/14 1738 10/07/14 2200 10/08/14 0745  GLUCAP 113* 139* 82 134* 102*    Recent Results (from the past 240 hour(s))  Culture, blood (routine x 2)      Status: None   Collection Time: 09/29/14  9:45 AM  Result Value Ref Range Status   Specimen Description BLOOD LEFT HAND  Final   Special Requests BOTTLES DRAWN AEROBIC ONLY 3CC  Final   Culture   Final    NO GROWTH 5 DAYS Performed at [facility]    Report Status 10/05/2014 FINAL  Final  Culture,  blood (routine x 2)     Status: None   Collection Time: 09/29/14  9:55 AM  Result Value Ref Range Status   Specimen Description BLOOD LEFT ARM  Final   Special Requests BOTTLES DRAWN AEROBIC ONLY 5CC  Final   Culture   Final    NO GROWTH 5 DAYS Performed at [facility]    Report Status 10/05/2014 FINAL  Final  Urine culture     Status: None   Collection Time: 09/29/14 11:49 AM  Result Value Ref Range Status   Specimen Description URINE, CATHETERIZED  Final   Special Requests NONE  Final   Colony Count NO GROWTH Performed at [facility]   Final   Culture NO GROWTH Performed at [facility]   Final   Report Status 09/30/2014 FINAL  Final  Culture, blood (routine x 2)     Status: None (Preliminary result)   Collection Time: 10/05/14  1:19 AM  Result Value Ref Range Status   Specimen Description BLOOD RIGHT HAND  Final   Special Requests BOTTLES DRAWN AEROBIC ONLY 10CC  Final   Culture   Final           BLOOD CULTURE RECEIVED NO GROWTH TO DATE CULTURE WILL BE HELD FOR 5 DAYS BEFORE ISSUING A FINAL NEGATIVE REPORT Note: Culture results may be compromised due to an excessive volume of blood received in culture bottles. Performed at [facility]    Report Status PENDING  Incomplete  Culture, blood (routine x 2)     Status: None (Preliminary result)   Collection Time: 10/05/14  1:25 AM  Result Value Ref Range Status   Specimen Description BLOOD LEFT HAND  Final   Special Requests BOTTLES DRAWN AEROBIC ONLY 10CC  Final   Culture   Final           BLOOD CULTURE RECEIVED NO GROWTH TO DATE CULTURE WILL BE HELD FOR 5 DAYS BEFORE ISSUING A FINAL  NEGATIVE REPORT Performed at [facility]    Report Status PENDING  Incomplete  Culture, Urine     Status: None   Collection Time: 10/05/14  2:32 AM  Result Value Ref Range Status   Specimen Description URINE, CATHETERIZED  Final   Special Requests NONE  Final   Colony Count   Final    >=100,000 COLONIES/ML Performed at [facility]    Culture   Final    KLEBSIELLA PNEUMONIAE Note: Confirmed Extended Spectrum Beta-Lactamase Producer (ESBL) Performed at [facility]    Report Status 10/07/2014 FINAL  Final   Organism ID, Bacteria KLEBSIELLA PNEUMONIAE  Final      Susceptibility   Klebsiella pneumoniae - MIC*    AMPICILLIN >=32 RESISTANT Resistant     CEFAZOLIN >=64 RESISTANT Resistant     CEFTRIAXONE >=64 RESISTANT Resistant     CIPROFLOXACIN >=4 RESISTANT Resistant     GENTAMICIN >=16 RESISTANT Resistant     LEVOFLOXACIN >=8 RESISTANT Resistant     NITROFURANTOIN 256 RESISTANT Resistant     TOBRAMYCIN >=16 RESISTANT Resistant     TRIMETH/SULFA >=320 RESISTANT Resistant     IMIPENEM <=0.25 SENSITIVE Sensitive     PIP/TAZO >=128 RESISTANT Resistant     * KLEBSIELLA PNEUMONIAE     Scheduled Meds: . aspirin EC  325 mg Oral Daily  . atorvastatin  10 mg Oral QHS  . clonazePAM  0.5 mg Oral QHS  . DULoxetine  60 mg Oral QHS  . feeding supplement (  ENSURE COMPLETE)  237 mL Oral BID BM  . ferrous sulfate  325 mg Oral BID WC  . folic acid  1 mg Oral Daily  . hydrocortisone sodium succinate  100 mg Intravenous Q8H  . insulin aspart  0-15 Units Subcutaneous TID AC & HS  . lactulose  20 g Oral QID  . lamoTRIgine  200 mg Oral BID  . levothyroxine  125 mcg Oral QAC breakfast  . meropenem (MERREM) IV  1 g Intravenous Q8H  . multivitamin with minerals  1 tablet Oral Daily  . oxybutynin  5 mg Oral Daily  . pantoprazole  40 mg Oral Daily  . polyvinyl alcohol  2 drop Both Eyes TID  . potassium chloride  10 mEq Intravenous Q1 Hr x 4  . potassium chloride  40  mEq Oral Q3H  . pregabalin  75 mg Oral BID  . QUEtiapine  200 mg Oral BID  . senna  2 tablet Oral Daily  . sodium chloride  3 mL Intravenous Q12H  . thiamine  100 mg Oral Daily  . vitamin B-12  1,000 mcg Oral QODAY  . warfarin  1 mg Oral ONCE-1800  . Warfarin - Pharmacist Dosing Inpatient   Does not apply q1800   Continuous Infusions:    Marzetta Board, MD [facility] Pager [phone]. If 7 PM - 7 AM, please contact night-coverage at [email or web address], password [facility] 10/08/2014, 10:08 AM  LOS: 5 days

## 2014-10-08 NOTE — Progress Notes (Signed)
ANTICOAGULATION CONSULT NOTE - Follow Up Consult  Pharmacy Consult for Coumadin Indication: PE history  Allergies  Allergen Reactions  . Tomato Other (See Comments)    Unknown reaction    Patient Measurements: Height: 5\' 5"  (165.1 cm) Weight: 238 lb 5.1 oz (108.1 kg) IBW/kg (Calculated) : 61.5  Labs:  Recent Labs  10/06/14 0550 10/07/14 0615 10/08/14 0504  HGB  --  7.9* 8.6*  HCT  --  24.5* 25.8*  PLT  --  416* 367  LABPROT 32.7* 28.8* 31.7*  INR 3.16* 2.69* 3.03*  CREATININE  --  1.17 1.05    Estimated Creatinine Clearance: 87.9 mL/min (by C-G formula based on Cr of 1.05).  Assessment: 59 year old male continues on Coumadin for hx PE. INR is now slightly supratherapeutic at 3.03. Mild hematuria but improving. H/H low but stable and platelets are WNL.   Goal of Therapy:  INR 2-3   Plan:  - Warfarin 1mg  PO x 1 tonight (low dose to prevent large INR drop) - F/u AM INR  Salome Arnt, PharmD, BCPS Pager # [phone] 10/08/2014 7:33 AM
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
## 2014-10-09 DIAGNOSIS — I2699 Other pulmonary embolism without acute cor pulmonale: Secondary | ICD-10-CM

## 2014-10-09 LAB — BASIC METABOLIC PANEL
ANION GAP: 9 (ref 5–15)
BUN: 6 mg/dL (ref 6–23)
CHLORIDE: 105 mmol/L (ref 96–112)
CO2: 23 mmol/L (ref 19–32)
Calcium: 7.7 mg/dL — ABNORMAL LOW (ref 8.4–10.5)
Creatinine, Ser: 0.85 mg/dL (ref 0.50–1.35)
GFR calc non Af Amer: >90 mL/min (ref >90)
Glucose, Bld: 153 mg/dL — ABNORMAL HIGH (ref 70–99)
POTASSIUM: 3.9 mmol/L (ref 3.5–5.1)
SODIUM: 137 mmol/L (ref 135–145)

## 2014-10-09 LAB — CBC
HEMATOCRIT: 28.2 % — AB (ref 39.0–52.0)
Hemoglobin: 9.2 g/dL — ABNORMAL LOW (ref 13.0–17.0)
MCH: 26.1 pg (ref 26.0–34.0)
MCHC: 32.6 g/dL (ref 30.0–36.0)
MCV: 80.1 fL (ref 78.0–100.0)
Platelets: 386 10*3/uL (ref 150–400)
RBC: 3.52 MIL/uL — ABNORMAL LOW (ref 4.22–5.81)
RDW: 15.3 % (ref 11.5–15.5)
WBC: 7.8 10*3/uL (ref 4.0–10.5)

## 2014-10-09 LAB — MAGNESIUM: MAGNESIUM: 1.9 mg/dL (ref 1.5–2.5)

## 2014-10-09 LAB — GLUCOSE, CAPILLARY
GLUCOSE-CAPILLARY: 106 mg/dL — AB (ref 70–99)
Glucose-Capillary: 115 mg/dL — ABNORMAL HIGH (ref 70–99)

## 2014-10-09 LAB — PROTIME-INR
INR: 2.99 — ABNORMAL HIGH (ref 0.00–1.49)
Prothrombin Time: 31.3 seconds — ABNORMAL HIGH (ref 11.6–15.2)

## 2014-10-09 LAB — HIV ANTIBODY (ROUTINE TESTING W REFLEX): HIV SCREEN 4TH GENERATION: NONREACTIVE

## 2014-10-09 MED ORDER — LACTULOSE 10 GM/15ML PO SOLN: 10.0000 g | Freq: Every day | ORAL | Status: DC

## 2014-10-09 MED ORDER — PREDNISONE 10 MG PO TABS: 40.0000 mg | ORAL_TABLET | Freq: Every day | ORAL | Status: DC

## 2014-10-09 MED ORDER — CLONAZEPAM 0.5 MG PO TABS: 0.2500 mg | ORAL_TABLET | Freq: Three times a day (TID) | ORAL | Status: DC | PRN

## 2014-10-09 MED ORDER — POTASSIUM CHLORIDE ER 10 MEQ PO CPCR: 30.0000 meq | ORAL_CAPSULE | Freq: Every day | ORAL | Status: DC

## 2014-10-09 MED ORDER — FENTANYL 25 MCG/HR TD PT72: 25.0000 ug | MEDICATED_PATCH | TRANSDERMAL | Status: DC

## 2014-10-09 MED ORDER — OXYCODONE HCL 10 MG PO TABS: 10.0000 mg | ORAL_TABLET | Freq: Three times a day (TID) | ORAL | Status: DC | PRN

## 2014-10-09 MED ORDER — WARFARIN SODIUM 1 MG PO TABS
1.0000 mg | ORAL_TABLET | Freq: Once | ORAL | Status: DC
  Filled 2014-10-09: qty 1

## 2014-10-09 MED ORDER — PREDNISONE 20 MG PO TABS
40.0000 mg | ORAL_TABLET | Freq: Every day | ORAL | Status: DC
  Administered 2014-10-09: 40 mg via ORAL
  Filled 2014-10-09: qty 2

## 2014-10-09 NOTE — Progress Notes (Signed)
Patient discharged to [facility] as ordered, report called to nurse Sol Blazing LPN, transported via [facility].

## 2014-10-09 NOTE — Care Management Note (Signed)
  Page 1 of 1   10/09/2014     8:09:30 AM CARE MANAGEMENT NOTE 10/09/2014  Patient:  Samuel [name], Samuel [name]   Account Number:  [id number]  Date Initiated:  10/09/2014  Documentation initiated by:  Magdalen Spatz  Subjective/Objective Assessment:     Action/Plan:   Anticipated DC Date:  10/11/2014   Anticipated DC Plan:  [place]  In-house referral  Clinical Social Worker         Choice offered to / List presented to:             Status of service:   Medicare Important Message given?  YES (If response is "NO", the following Medicare IM given date fields will be blank) Date Medicare IM given:  10/09/2014 Medicare IM given by:  Magdalen Spatz Date Additional Medicare IM given:   Additional Medicare IM given by:    Discharge Disposition:    Per UR Regulation:  Reviewed for med. necessity/level of care/duration of stay  If discussed at [place] of Stay Meetings, dates discussed:    Comments:

## 2014-10-09 NOTE — Clinical Social Work Note (Signed)
Patient to be d/c'ed today to [facility].  Patient and family agreeable to plans will transport via ems RN to call report.  Evette Cristal, MSW, [place]

## 2014-10-09 NOTE — Discharge Summary (Signed)
Physician Discharge Summary  Samuel [name] PYP:[id number] DOB: Jun 26, 1956 DOA: 10/03/2014  PCP: [name], MD  Admit date: 10/03/2014 Discharge date: 10/09/2014  Time spent: 45 minutes  Recommendations for Outpatient Follow-up:  1. Follow up with Dr. [name] in 2 days 2. Follow up with Dr. Dellia Nims in 1 week 3. Follow up with Dr. Alen Blew, they will schedule an appointment 4. Please monitor BMP in 2 days  5. Please repeat CBC in 2 days 6. Please note changes below to his chronic pain medications  Discharge Diagnoses:  Active Problems:   Hepatitis C   Hypokalemia   Encephalopathy acute   HTN (hypertension)   Acute blood loss anemia   Pulmonary emboli   Hematuria   Acute encephalopathy   Altered mental state   AKI (acute kidney injury)   Altered mental status   Renal insufficiency   Urothelial cancer  Discharge Condition: stable  Diet recommendation: heart healthy  Filed Weights   10/03/14 2044 10/04/14 0517  Weight: 108.3 kg (238 lb 12.1 oz) 108.1 kg (238 lb 5.1 oz)    History of present illness:  Samuel [name] is a 59 y.o. male presents with altered mental status. Patient had presented to the hospital recently with shortness of breath and cough. He was found to have a pneumonia and was treated as such. Recently discharged back to the nursing home on March 3 after about a 2 week stay. He had been doing relatively well but he is sent today from the nursing home due to altered mental status. Patient has eyes open but is not really able to communicate at this time. He has no fevers noted. He was apparently noted to be having decreased responsiveness around 10AM. He is on multiple medications that could be causing an altered mentation including klonipin baclofen seroquel fentanyl and lyrica among others. He was given narcan in the ED with minimal response. He had fentanyl on his body and this patch was removed. He is not agitated but is definitely  confused.  Hospital Course:  Hypotension with acute encephalopathy, ESBL UTI vs ESBL bacteriuria - likely multifactorial due to presumed component of adrenal insufficiency, polypharmacy. There was a concern initially that patient is septic and due to multiple urine cultures growing highly resistant organisms he was started empirically on Vancomycin and Meropenem. His urine cultures did again grow ESBL Klebsiella. ID was consulted and recommend against treating it as infection as most likely it represents a colonizer. Patient has remained stable off of antibiotics and will be discharged back to SNF in stable condition. - patient improved clinically as his baclofen, fentanyl patch, Lyrica were held initially and he was on stress dose steroids as well as antibiotics.  - his Lyrica was resumed without further problems however will recommend discontinuing the fentanyl patch and his baclofen.  Hematuria with recent bilateral hydronephrosis, high grade urothelial cancer - s/p TURBT 3/1 with ureteral stent by Dr. [name]  - repeat renal US 3/10 without hydronephrosis  - still with hematuria in the bag although per patient much improved - Hemoglobin stable - discussed with Dr. 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 Acute blood loss Anemia  - also in the setting of chronic medical problems and ongoing hematuria, sepsis, etc - Hb slowly trending down, transfuse 1U 3/12, responded appropriately and has remained stable Acute hypoxic respiratory  failure - resolved, CXR unremarkable History of PE after left hip surgery Feb 2015 - history of massive PE, needing life long anticoagulation  - on Coumadin Hypothyroidism - continue synthroid Hypokalemia  - ongoing need for repletion, improved with magnesium repletion as well - increased his potassium supplementation on discharge - recommend rechecking a BMP in 2-3 days at SNF  Procedures:  None    Consultations:  ID  Discharge Exam: Filed Vitals:   10/08/14 0612 10/08/14 1300 10/08/14 2317 10/09/14 0451  BP: 170/78 160/91 156/91 169/89  Pulse: 77 109 65 61  Temp: 98.7 F (37.1 C) 98.7 F (37.1 C) 99 F (37.2 C) 97.8 F (36.6 C)  TempSrc:  Oral Oral Oral  Resp: 18 18 18 18   Height:      Weight:      SpO2: 93% 94% 95% 95%   General: NAD Cardiovascular: RRR Respiratory: CTA biL  Discharge Instructions    Medication List    STOP taking these medications        baclofen 20 MG tablet  Commonly known as:  LIORESAL     fentaNYL 25 MCG/HR patch  Commonly known as:  DURAGESIC - dosed mcg/hr     HYDROcodone-acetaminophen 5-325 MG per tablet  Commonly known as:  NORCO/VICODIN      TAKE these medications        acetaminophen 325 MG tablet  Commonly known as:  TYLENOL  Take 650 mg by mouth every 6 (six) hours as needed for moderate pain.     alum & mag hydroxide-simeth 200-200-20 MG/5ML suspension  Commonly known as:  MAALOX/MYLANTA  Take 20 mLs by mouth 2 (two) times daily as needed for indigestion or heartburn.     atorvastatin 10 MG tablet  Commonly known as:  LIPITOR  Take 10 mg by mouth at bedtime.     calcium carbonate 1250 (500 CA) MG tablet  Commonly known as:  OS-CAL - dosed in mg of elemental calcium  Take 1 tablet by mouth daily with breakfast.     calcium carbonate 500 MG chewable tablet  Commonly known as:  TUMS - dosed in mg elemental calcium  Chew 4 tablets by mouth every 4 (four) hours as needed for indigestion or heartburn.      chlorpheniramine 4 MG tablet  Commonly known as:  CHLOR-TRIMETON  Take 8 mg by mouth at bedtime as needed for allergies (cough).     clonazePAM 0.5 MG tablet  Commonly known as:  KLONOPIN  Take 0.5 tablets (0.25 mg total) by mouth 3 (three) times daily as needed for anxiety.     DEXILANT 60 MG capsule  Generic drug:  dexlansoprazole  Take 60 mg by mouth 2 (two) times daily.     docusate sodium 100 MG capsule  Commonly known as:  COLACE  Take 1 capsule (100 mg total) by mouth 2 (two) times daily.     DULoxetine 60 MG capsule  Commonly known as:  CYMBALTA  Take 60 mg by mouth at bedtime.     DUONEB 0.5-2.5 (3) MG/3ML Soln  Generic drug:  ipratropium-albuterol  Take 3 mLs by nebulization See admin instructions. Inhale 1 vial 4 times daily for lung disease; may also take every 4 hours  as needed for wheezing/ congestion     famotidine 20 MG tablet  Commonly known as:  PEPCID  Take 20 mg by mouth at bedtime.     feeding supplement (ENSURE COMPLETE) Liqd  Take 237 mLs by mouth 2 (two) times daily between meals.     ferrous sulfate 325 (65 FE) MG tablet  Take 325 mg by mouth 2 (two) times daily with a meal.     folic acid 1 MG tablet  Commonly known as:  FOLVITE  Take 1 mg by mouth daily.     GENERLAC 10 GM/15ML Soln  Generic drug:  lactulose (encephalopathy)  Take 20 g by mouth 4 (four) times daily. 9am, 1pm, 5pm, 9pm     guaiFENesin 100 MG/5ML liquid  Commonly known as:  ROBITUSSIN  Take 300 mg by mouth 3 (three) times daily as needed for cough.     lamoTRIgine 200 MG tablet  Commonly known as:  LAMICTAL  Take 200 mg by mouth 2 (two) times daily. To stabilize bipolar mood     levothyroxine 125 MCG tablet  Commonly known as:  SYNTHROID, LEVOTHROID  Take 125 mcg by mouth daily before breakfast.     meclizine 25 MG tablet  Commonly known as:  ANTIVERT  Take 1 tablet (25 mg total) by mouth 3 (three) times daily as needed for dizziness.     metoCLOPramide 5 MG tablet    Commonly known as:  REGLAN  Take 5 mg by mouth 4 (four) times daily -  before meals and at bedtime.     metoprolol tartrate 25 MG tablet  Commonly known as:  LOPRESSOR  Take 1 tablet (25 mg total) by mouth 2 (two) times daily.     nitroGLYCERIN 0.4 MG SL tablet  Commonly known as:  NITROSTAT  Place 0.4 mg under the tongue every 5 (five) minutes as needed for chest pain. Up to 3 doses     ondansetron 4 MG tablet  Commonly known as:  ZOFRAN  Take 4 mg by mouth 3 (three) times daily as needed for nausea or vomiting.     oxybutynin 5 MG 24 hr tablet  Commonly known as:  DITROPAN-XL  Take 5 mg by mouth daily.     Oxycodone HCl 10 MG Tabs  Take 1 tablet (10 mg total) by mouth every 8 (eight) hours as needed (moderate to severe pain).     potassium chloride 10 MEQ CR capsule  Commonly known as:  MICRO-K  Take 3 capsules (30 mEq total) by mouth daily.     predniSONE 10 MG tablet  Commonly known as:  DELTASONE  Take 4 tablets (40 mg total) by mouth daily with breakfast. 40 mg daily for 5 days then 30 mg daily for 5 days then 20 mg daily for 5 days then 10 mg daily for 5 days then stop     pregabalin 75 MG capsule  Commonly known as:  LYRICA  Take one capsule by mouth twice daily for pain     promethazine 12.5 MG tablet  Commonly known as:  PHENERGAN  Take 12.5 mg by mouth every 6 (six) hours as needed for nausea or vomiting.     QUEtiapine 200 MG tablet  Commonly known as:  SEROQUEL  Take 200 mg by mouth 2 (two) times daily.     senna 8.6 MG Tabs tablet  Commonly known as:  SENOKOT  Take 2 tablets (17.2 mg total) by mouth daily.     SYSTANE OP  Place 2 drops into both eyes 3 (three) times daily. 8am, 2pm, 8pm     vitamin B-12 1000 MCG tablet  Commonly known as:  CYANOCOBALAMIN  Take 1,000 mcg by mouth every other day.     Vitamin D (Ergocalciferol) 50000 UNITS Caps capsule  Commonly known as:  DRISDOL  Take 50,000 Units by mouth every 30 (thirty) days. Takes on the  11th of the month     warfarin 4 MG tablet  Commonly known as:  COUMADIN  Take 4 mg by mouth See admin instructions. Take 4.5 mg Monday through Friday, Saturday and Sunday 4 mg at 5 PM           Follow-up Information    Follow up with [facility], MATTHEW, MD. Schedule an appointment as soon as possible for a visit in 2 weeks.   Specialty:  Urology   Contact information:   [place] [address] [phone]       Follow up with [name], MD. Schedule an appointment as soon as possible for a visit in 1 week.   Specialty:  Internal Medicine   Contact information:   [place] [address] [phone]       The results of significant diagnostics from this hospitalization (including imaging, microbiology, ancillary and laboratory) are listed below for reference.    Significant Diagnostic Studies: Ct Abdomen Pelvis Wo Contrast  09/25/2014   CLINICAL DATA:  Hydronephrosis  EXAM: CT ABDOMEN AND PELVIS WITHOUT CONTRAST  TECHNIQUE: Multidetector CT imaging of the abdomen and pelvis was performed following the standard protocol without IV contrast.  COMPARISON:  09/19/2014  FINDINGS: BODY WALL: Symmetric gynecomastia  LOWER CHEST: Decreasing airspace disease in the right lower lobe. Clustered centrilobular nodules in the right middle lobe are unchanged and could be chronic.  Small hiatal hernia.  ABDOMEN/PELVIS:  Liver: Hepatic steatosis.  Biliary: Distended gallbladder with calcified gallstones. There is no evidence of acute inflammation.  Pancreas: Unremarkable.  Spleen: Unremarkable.  Adrenals: Unremarkable.  Kidneys and ureters: Moderate bilateral hydroureteronephrosis, despite decompressed bladder. Nonobstructive stones seen in the left lower pole, up to 5 mm.  Bladder: Irregular thickening of the decompressed bladder, suspicious for tumor. A Foley catheter remains in good position.  Reproductive: Unremarkable.  Bowel: Descending colostomy with  unchanged tissue in the low pelvis. Prominent volume of formed stool throughout the colon. No evidence of bowel obstruction. No inflammatory bowel wall thickening.  Retroperitoneum: Re- demonstrated right external iliac chain lymphadenopathy, including a 27 x 42 mm mass. No periaortic lymphadenopathy.  Peritoneum: No ascites or pneumoperitoneum.  Vascular: No acute abnormality.  OSSEOUS: Sacrificed spinal catheter fragments noted. There is extensive spinal fusion and sacral truncation. The patient is status post left hip disarticulation and right intertrochanteric femur fracture ORIF. 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  IMPRESSION: 1. 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 2. Right pelvic adenopathy compatible with metastatic disease. 3. Improving pneumonia in the right lower lobe. 4. Chronic findings are stable from prior and noted above.   Electronically Signed   By: [name] M.D.   On: 09/25/2014 05:05   Ct Abdomen Pelvis Wo Contrast  09/19/2014   CLINICAL DATA:  59 year old male with acute kidney injury, bilateral hydronephrosis on recent renal ultrasound, hematuria and elevated white count. 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  EXAM: CT ABDOMEN AND PELVIS WITHOUT CONTRAST  TECHNIQUE: Multidetector CT imaging of the abdomen and pelvis was performed following the standard protocol without  IV contrast.  COMPARISON:  09/18/2014 ultrasound. 08/30/2014 abdominal-pelvic CT from [facility]. 07/05/2014 chest CT.  FINDINGS: Please note that parenchymal abnormalities may be missed without intravenous contrast.  Lower chest: Right basilar airspace disease and atelectasis have increased since 08/30/2014. Increasing left basilar atelectasis is noted. There are tiny bilateral pleural effusions present.  Hepatobiliary: No hepatic abnormalities are identified. Cholelithiasis again noted without CT evidence of acute cholecystitis. There is no  evidence of biliary dilatation.  Pancreas: Unremarkable  Spleen: Unremarkable  Adrenals/Urinary Tract: Moderate to severe bilateral hydronephrosis to the bladder again noted, stable on the right and increased on the left since 08/30/2014. A Foley catheter within a collapsed bladder is noted but again noted is abnormal increased density within the bladder versus bladder wall thickening, left-greater-than-right.  A nonobstructing 6 mm mid left renal calculus is noted.  The adrenal glands are unremarkable.  Stomach/Bowel: Prior PEG tube changes again identified. There is no evidence of bowel obstruction or pneumoperitoneum. Left colostomy changes again noted.  Vascular/Lymphatic: Enlarged iliac lymph nodes are unchanged with an index right external iliac lymph node again measuring 2.8 cm (image 64). No new enlarged lymph nodes are identified.  Reproductive: The prostate gland is enlarged.  Other: No evidence of free fluid.  Musculoskeletal: Chronic left hemipelvis bony deformities are again noted including absence of the proximal left femur, chronic right femoral head destruction, flattening and dysplasia of both acetabula, probable right hip effusion and lower sacral/ coccygeal deformity. Prior lumbar spine fusion, thoracolumbar posterior decompression and probable intraspinal pain catheter again noted.  IMPRESSION: Unchanged abnormal increased density within the bladder/bladder wall thickening suspicious for tumor. Moderate to severe bilateral hydronephrosis again noted, stable on the right and increased on the left since 08/30/2014, likely related to tumor obstruction.  Unchanged iliac lymphadenopathy, worrisome for metastatic disease.  Increased airspace disease and atelectasis at the right lung base with aspiration or pneumonia considerations. Increased left basilar atelectasis and new tiny bilateral pleural effusions.  Chronic bony changes within the pelvis.  Cholelithiasis without CT evidence of acute  cholecystitis.  Prostatomegaly.   Electronically Signed   By: Margarette Canada M.D.   On: 09/19/2014 11:25   Dg Chest 2 View  09/20/2014   CLINICAL DATA:  Pneumonia.  EXAM: CHEST  2 VIEW  COMPARISON:  09/17/2014  FINDINGS: Right PICC line is in place with the tip in the SVC. Very low lung volumes. Elevation of the right hemidiaphragm. Limited evaluation due to the low volumes. There is cardiomegaly. No acute bony abnormality.  IMPRESSION: Limited study due to very low lung volumes. Findings similar to prior study.  Right PICC line tip in the SVC.   Electronically Signed   By: Rolm Baptise M.D.   On: 09/20/2014 12:06   Ct Head Wo Contrast  10/03/2014   CLINICAL DATA:  Altered mental status. Acute delirium and headache with left-sided facial droop. Patient is unable to speak.  EXAM: CT HEAD WITHOUT CONTRAST  TECHNIQUE: Contiguous axial images were obtained from the base of the skull through the vertex without intravenous contrast.  COMPARISON:  09/23/2014  FINDINGS: Mild technical limitation due to motion artifact. Mild diffuse cerebral atrophy. Ventricles are not dilated. No significant white matter changes. No mass effect or midline shift. No abnormal extra-axial fluid collections. Gray-white matter junctions are distinct. Basal cisterns are not effaced. No evidence of acute intracranial hemorrhage. No depressed skull fractures. Visualized paranasal sinuses and mastoid air cells are not opacified.  IMPRESSION: No acute intracranial abnormalities.  Mild  diffuse atrophy.   Electronically Signed   By: Lucienne Capers M.D.   On: 10/03/2014 17:26   Ct Head Wo Contrast  09/23/2014   CLINICAL DATA:  Took Ativan to sleep last night. Not responding this morning. Altered mental status.  EXAM: CT HEAD WITHOUT CONTRAST  TECHNIQUE: Contiguous axial images were obtained from the base of the skull through the vertex without intravenous contrast.  COMPARISON:  12/07/2013  FINDINGS: Patient was uncooperative for the study which  had to be repeated several times due to motion. No visible acute intracranial abnormality. No visible acute infarction. No hemorrhage or hydrocephalus. No extra-axial fluid collection. Paranasal sinuses and mastoids are clear. No acute calvarial abnormality.  IMPRESSION: Typical, somewhat limited study due to patient motion. Study was repeated several times. No visible acute intracranial abnormality. With the multiple attempts, the study is felt to be diagnostic.   Electronically Signed   By: Rolm Baptise M.D.   On: 09/23/2014 11:12   US Renal  10/05/2014   CLINICAL DATA:  Hydronephrosis, history hypertension, hyperlipidemia, neurogenic bladder, paraplegia  EXAM: RENAL/URINARY TRACT ULTRASOUND COMPLETE  COMPARISON:  09/29/2014  FINDINGS: Degradation of image quality secondary to body habitus.  Right Kidney:  Length: 12.2 cm. Grossly normal cortical thickness and echogenicity. No mass, hydronephrosis or shadowing calcification.  Left Kidney:  Length: 11.9 cm. Grossly normal cortical thickness. Suboptimal visualization of upper and lower poles due to body habitus. No gross evidence of mass or hydronephrosis. No shadowing calcification.  Bladder:  Decompressed by catheter, not assessed.  IMPRESSION: Suboptimal exam secondary to body habitus.  No gross evidence of renal mass or hydronephrosis.   Electronically Signed   By: Lavonia Dana M.D.   On: 10/05/2014 08:27   US Renal  09/29/2014   CLINICAL DATA:  Acute renal failure, hematuria  EXAM: RENAL/URINARY TRACT ULTRASOUND COMPLETE  COMPARISON:  09/25/2014  FINDINGS: Right Kidney:  Length: 11.3 cm. Echogenicity within normal limits. No mass or hydronephrosis visualized.  Left Kidney:  Length: 12.4 cm. Echogenicity within normal limits. No mass or hydronephrosis visualized.  Bladder:  Decompressed by Foley catheter.  IMPRESSION: No acute abnormality is noted. The previously seen hydronephrosis has resolved in the interval.   Electronically Signed   By: Inez Catalina  M.D.   On: 09/29/2014 15:57   US Renal  09/18/2014   CLINICAL DATA:  Acute renal failure  EXAM: RENAL/URINARY TRACT ULTRASOUND COMPLETE  COMPARISON:  CT, 08/30/2014  FINDINGS: Right Kidney:  Length: 13.2 cm. Normal parenchymal echogenicity. No mass or stone. Moderate hydronephrosis.  Left Kidney:  Length: 13.2 cm. Normal parenchymal echogenicity. No mass or stone. Mild to moderate hydronephrosis.  Bladder:  Decompressed by a Foley catheter.  IMPRESSION: 1. Bilateral hydronephrosis, moderate on the right and mild to moderate on the left. Hydronephrosis on the left appears increased when compared to the prior CT. That on the right is similar.   Electronically Signed   By: Lajean Manes M.D.   On: 09/18/2014 17:14   Dg Chest Port 1 View  10/05/2014   CLINICAL DATA:  Respiratory distress.  Central line placement.  EXAM: PORTABLE CHEST - 1 VIEW  COMPARISON:  10/03/2014  FINDINGS: Shallow inspiration with elevation of the right hemidiaphragm. Atelectasis in the lung bases. Borderline heart size with normal pulmonary vascularity. Interval placement of a right central venous catheter with tip overlying the low SVC region. No pneumothorax.  IMPRESSION: Right central venous catheter with tip over the low SVC. Shallow inspiration with atelectasis in the  lung bases.   Electronically Signed   By: Lucienne Capers M.D.   On: 10/05/2014 02:33   Dg Chest Port 1 View  10/03/2014   CLINICAL DATA:  Altered mental status  EXAM: PORTABLE CHEST - 1 VIEW  COMPARISON:  09/28/2014  FINDINGS: Previously seen right-sided PICC line is been removed in the interval. The cardiac shadow is mildly enlarged but stable. Persistent right basilar atelectasis is noted. No new focal abnormality is seen.  IMPRESSION: No acute abnormality noted.   Electronically Signed   By: Inez Catalina M.D.   On: 10/03/2014 16:49   Dg Chest Port 1 View  09/28/2014   CLINICAL DATA:  PICC placement  EXAM: PORTABLE CHEST - 1 VIEW  COMPARISON:  09/22/2014   FINDINGS: Right PICC tip projects in the lower superior vena cava.  Lung volumes are low as that were on the prior study. There is persistent lung base atelectasis. No convincing pneumonia. No pulmonary edema. No pneumothorax.  IMPRESSION: Right PICC line tip projects in the lower superior vena cava. No convincing pneumonia. No pulmonary edema. Persistent basilar atelectasis.   Electronically Signed   By: Lajean Manes M.D.   On: 09/28/2014 13:37   Dg Chest Port 1 View  09/22/2014   CLINICAL DATA:  Respiratory distress.  EXAM: PORTABLE CHEST - 1 VIEW  COMPARISON:  09/20/2014  FINDINGS: Right PICC line remains in place, unchanged. Very low lung volumes with cardiomegaly and bibasilar atelectasis. No overt edema. No visible effusions. No acute bony abnormality.  IMPRESSION: Very low lung volumes with cardiomegaly and bibasilar atelectasis.   Electronically Signed   By: Rolm Baptise M.D.   On: 09/22/2014 20:49   Dg Chest Port 1 View  09/17/2014   CLINICAL DATA:  Initial evaluation for shortness of breath  EXAM: PORTABLE CHEST - 1 VIEW  COMPARISON:  06/28/2014  FINDINGS: Very limited inspiratory effect. Right lung clear except for mild atelectasis at the base. The retrocardiac left lower lobe is difficult to evaluate. The left upper lobe is clear.  IMPRESSION: Limited study due to very limited inspiratory effect. Retrocardiac left lower lobe difficult to evaluate but left lower lobe infiltrate suspected.   Electronically Signed   By: Skipper Cliche M.D.   On: 09/17/2014 16:30    Microbiology: Recent Results (from the past 240 hour(s))  Urine culture     Status: None   Collection Time: 09/29/14 11:49 AM  Result Value Ref Range Status   Specimen Description URINE, CATHETERIZED  Final   Special Requests NONE  Final   Colony Count NO GROWTH Performed at [facility]   Final   Culture NO GROWTH Performed at [facility]   Final   Report Status 09/30/2014 FINAL  Final  Culture, blood  (routine x 2)     Status: None (Preliminary result)   Collection Time: 10/05/14  1:19 AM  Result Value Ref Range Status   Specimen Description BLOOD RIGHT HAND  Final   Special Requests BOTTLES DRAWN AEROBIC ONLY 10CC  Final   Culture   Final           BLOOD CULTURE RECEIVED NO GROWTH TO DATE CULTURE WILL BE HELD FOR 5 DAYS BEFORE ISSUING A FINAL NEGATIVE REPORT Note: Culture results may be compromised due to an excessive volume of blood received in culture bottles. Performed at [facility]    Report Status PENDING  Incomplete  Culture, blood (routine x 2)     Status: None (Preliminary result)  Collection Time: 10/05/14  1:25 AM  Result Value Ref Range Status   Specimen Description BLOOD LEFT HAND  Final   Special Requests BOTTLES DRAWN AEROBIC ONLY 10CC  Final   Culture   Final           BLOOD CULTURE RECEIVED NO GROWTH TO DATE CULTURE WILL BE HELD FOR 5 DAYS BEFORE ISSUING A FINAL NEGATIVE REPORT Performed at [facility]    Report Status PENDING  Incomplete  Culture, Urine     Status: None   Collection Time: 10/05/14  2:32 AM  Result Value Ref Range Status   Specimen Description URINE, CATHETERIZED  Final   Special Requests NONE  Final   Colony Count   Final    >=100,000 COLONIES/ML Performed at [facility]    Culture   Final    KLEBSIELLA PNEUMONIAE Note: Confirmed Extended Spectrum Beta-Lactamase Producer (ESBL) Performed at [facility]    Report Status 10/07/2014 FINAL  Final   Organism ID, Bacteria KLEBSIELLA PNEUMONIAE  Final      Susceptibility   Klebsiella pneumoniae - MIC*    AMPICILLIN >=32 RESISTANT Resistant     CEFAZOLIN >=64 RESISTANT Resistant     CEFTRIAXONE >=64 RESISTANT Resistant     CIPROFLOXACIN >=4 RESISTANT Resistant     GENTAMICIN >=16 RESISTANT Resistant     LEVOFLOXACIN >=8 RESISTANT Resistant     NITROFURANTOIN 256 RESISTANT Resistant     TOBRAMYCIN >=16 RESISTANT Resistant     TRIMETH/SULFA >=320  RESISTANT Resistant     IMIPENEM <=0.25 SENSITIVE Sensitive     PIP/TAZO >=128 RESISTANT Resistant     * KLEBSIELLA PNEUMONIAE     Labs: Basic Metabolic Panel:  Recent Labs Lab 10/03/14 1615 10/03/14 2156 10/04/14 0856 10/05/14 0119 10/05/14 0450 10/05/14 0549 10/07/14 0615 10/08/14 0504 10/08/14 1605 10/09/14 0528  NA 138  --  138 135 137  --  140 140  --  137  K 4.8  --  3.7 3.4* 3.5  --  2.5* 2.5* 2.6* 3.9  CL 105  --  106 104 109  --  109 109  --  105  CO2 20  --  20 21 22   --  24 24  --  23  GLUCOSE 105*  --  102* 128* 189*  --  127* 104*  --  153*  BUN 18  --  16 20 18   --  7 5*  --  6  CREATININE 1.75* 1.64* 1.56* 2.32* 2.07*  --  1.17 1.05  --  0.85  CALCIUM 8.9  --  8.9 8.1* 7.3*  --  8.1* 7.8*  --  7.7*  MG  --   --   --   --   --  1.9 1.5  --   --  1.9  PHOS  --   --   --   --   --  4.0 2.5  --   --   --    Liver Function Tests:  Recent Labs Lab 10/03/14 1615 10/04/14 0856 10/08/14 0504  AST 21 20 21   ALT 14 15 21   ALKPHOS 69 71 97  BILITOT 0.4 0.5 0.5  PROT 7.4 7.4 5.7*  ALBUMIN 3.4* 3.3* 2.6*   No results for input(s): LIPASE, AMYLASE in the last 168 hours.  Recent Labs Lab 10/03/14 1615 10/05/14 0445  AMMONIA 73* 15   CBC:  Recent Labs Lab 10/03/14 1615  10/05/14 0119 10/05/14 0450 10/07/14 0615 10/08/14 0504 10/09/14  0528  WBC 6.6  < > 11.3* 9.2 9.1 7.7 7.8  NEUTROABS 5.0  --   --   --   --   --   --   HGB 9.2*  < > 8.5* 8.4* 7.9* 8.6* 9.2*  HCT 29.2*  < > 26.6* 26.5* 24.5* 25.8* 28.2*  MCV 85.4  < > 83.9 83.6 81.4 79.6 80.1  PLT 535*  < > 446* 466* 416* 367 386  < > = values in this interval not displayed. Cardiac Enzymes:  Recent Labs Lab 10/03/14 1615 10/03/14 2156 10/04/14 0506 10/04/14 0856 10/05/14 0420  TROPONINI <0.03 <0.03 <0.03 <0.03 <0.03   BNP: BNP (last 3 results)  Recent Labs  09/17/14 1651  BNP 41.0    ProBNP (last 3 results)  Recent Labs  12/07/13 2221  PROBNP 56.9    CBG:  Recent  Labs Lab 10/08/14 0745 10/08/14 1145 10/08/14 1657 10/08/14 2223 10/09/14 0834  GLUCAP 102* 103* 146* 109* 115*       Signed:  Jerren Flinchbaugh  [facility] 10/09/2014, 10:23 AM

## 2014-10-09 NOTE — Progress Notes (Signed)
ANTICOAGULATION CONSULT NOTE - Follow Up Consult  Pharmacy Consult for Coumadin Indication: PE history  Allergies  Allergen Reactions  . Tomato Other (See Comments)    Unknown reaction    Patient Measurements: Height: 5\' 5"  (165.1 cm) Weight: 238 lb 5.1 oz (108.1 kg) IBW/kg (Calculated) : 61.5  Labs:  Recent Labs  10/07/14 0615 10/08/14 0504 10/09/14 0528  HGB 7.9* 8.6* 9.2*  HCT 24.5* 25.8* 28.2*  PLT 416* 367 386  LABPROT 28.8* 31.7* 31.3*  INR 2.69* 3.03* 2.99*  CREATININE 1.17 1.05 0.85    Estimated Creatinine Clearance: 108.6 mL/min (by C-G formula based on Cr of 0.85).  Assessment: 59 year old male continues on Coumadin for hx PE. INR is now therapeutic at 2.99 but at the upper end of goal range. Mild hematuria but improving. H/H improving and platelets are WNL.   Goal of Therapy:  INR 2-3   Plan:  - Repeat Warfarin 1mg  PO x 1 tonight - F/u AM INR  Salome Arnt, PharmD, BCPS Pager # [phone] 10/09/2014 7:50 AM

## 2014-10-10 ENCOUNTER — Other Ambulatory Visit: Payer: Self-pay | Admitting: *Deleted

## 2014-10-10 ENCOUNTER — Non-Acute Institutional Stay (SKILLED_NURSING_FACILITY): Payer: Medicare Other | Admitting: Internal Medicine

## 2014-10-10 DIAGNOSIS — J9601 Acute respiratory failure with hypoxia: Secondary | ICD-10-CM

## 2014-10-10 DIAGNOSIS — R410 Disorientation, unspecified: Secondary | ICD-10-CM | POA: Diagnosis not present

## 2014-10-10 DIAGNOSIS — C689 Malignant neoplasm of urinary organ, unspecified: Secondary | ICD-10-CM

## 2014-10-10 DIAGNOSIS — C688 Malignant neoplasm of overlapping sites of urinary organs: Secondary | ICD-10-CM | POA: Diagnosis not present

## 2014-10-10 MED ORDER — OXYCODONE HCL 10 MG PO TABS: ORAL_TABLET | ORAL | Status: DC

## 2014-10-10 NOTE — Telephone Encounter (Signed)
[facility] # [phone] Fax: [phone] 

## 2014-10-11 ENCOUNTER — Other Ambulatory Visit: Payer: Self-pay | Admitting: Internal Medicine

## 2014-10-11 LAB — CULTURE, BLOOD (ROUTINE X 2)
CULTURE: NO GROWTH
CULTURE: NO GROWTH

## 2014-10-13 ENCOUNTER — Other Ambulatory Visit: Payer: Self-pay | Admitting: Urology

## 2014-10-15 NOTE — Progress Notes (Signed)
Patient ID: Samuel [name], male   DOB: 07/23/1956, 59 y.o.   MRN: [id number]                 HISTORY & PHYSICAL  DATE:  10/10/2014               FACILITY: [facility]                           LEVEL OF CARE:   SNF   CHIEF COMPLAINT:  Readmission to the facility, post stay at [facility], 10/03/2014 through 10/09/2014.                  HISTORY OF PRESENT ILLNESS:  I sent Samuel [name] to the hospital a week ago.  At that point, he had been fine in the morning, ate breakfast, and then became encephalopathic, quite delirious.  I kept him here for a few hours, hoping that some of this would abate.  However, he ended up going to the hospital.  He was given Narcan in the ER with minimal response.  His Fentanyl patch was removed.    He initially was felt to be septic on top of adrenal insufficiency and polypharmacy.  He had multiple urine cultures growing highly resistant organisms, initially treated with antibiotics.  However, ID was consulted after he grew ESBL Klebsiella.  They felt it was representative of a colonizer.  The patient improved as his Baclofen, Fentanyl patch, Norco were discontinued.  He was put on stress dose of steroids as well as antibiotics.    He was recently in hospital, having a transurethral bladder tumor resection on 09/26/2014 with a ureteral stent.  He had a renal ultrasound that did not show hydronephrosis.  His urologist is Dr. [name], and referral was made to Oncology for chemotherapy.    Also of note, he was felt to have acute adrenal insufficiency with hypotension on admission.  As I remember, he was put on prednisone due to ?asthma-induced nocturnal coughing.  He is on a prednisone taper.  We will see if we can get this to off or not.    The patient states he feels "miserable".  I think this is due to a lot of phantom limb pain.     PAST MEDICAL HISTORY/PROBLEM LIST:                                    Catheter-associated UTI/colonization.    Stage III  perineal pressure ulcer, which resolved.    Massive pulmonary embolism in February 2015.  I believe his IVC filter has been removed.  He is going to be on chronic lifelong anticoagulation.    Hypertension.    Hip fracture on the right earlier this year.    Hepatitis C, which the patient vehemently denies.    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    Bipolar affective disorder.    History of vitamin B12 deficiency.    Seizure disorder.    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  He has undergone a transurethral resection and stent placement.  He follows with Dr. [name].    PAST SURGICAL HISTORY:                        Transurethral bladder tumor resection.    Very proximal left above-knee amputation.  Right hip fracture repair.    Appendectomy.    Cholecystectomy.    Spinal cord surgery.    CURRENT MEDICATIONS:  Discharge medications include:        Notable that his Baclofen, Fentanyl patch, and Norco have been discontinued.     Otherwise, medications include:    Acetaminophen 650 q.6 hours for moderate pain.    Mylanta 20 mL b.i.d. for indigestion.    Lipitor 10 q.d.            Os-Cal 500 daily.    Tums 4 tablets by mouth every 4 hours as needed for indigestion or heartburn.    Chlorpheniramine 8 mg at bedtime as needed for allergies/cough.    Klonopin 0.5 three times a day as needed for anxiety.    Dexilant 60 mg b.i.d.        Colace 100 b.i.d.        Cymbalta 60 mg at bedtime.    DuoNebs q.4 hours p.r.n.        Pepcid 20 q.d.        Ferrous sulfate 325 b.i.d.        Folic acid 1 mg daily.    Robitussin 300 three times a day.    Lamictal 200 b.i.d.      Synthroid 125 daily.     Antivert 25 three times daily p.r.n. dizziness.    Reglan 5 mg q.i.d. before meals and at bedtime.    Lopressor 25 b.i.d.       Nitroglycerin p.r.n.        Zofran p.r.n.             Ditropan XL 5 mg daily.      Oxycodone 10 mg q.8 p.r.n.    Micro-K 3 capsules/30 mEq daily.      Prednisone taper lasting for roughly 12 days down to stop.    Lyrica 75 b.i.d.        Phenergan q.6 hours p.r.n.        Seroquel 200 b.i.d.         Senokot 8.6, 2 tablets by mouth daily.    Vitamin B12, 1000 every other day.     Vitamin D 50,000 U every month.    Coumadin 4.5 Monday through Friday, 4 mg Saturday and Sunday.       SOCIAL HISTORY:                   HOUSING:   The patient is a longstanding patient here.   RESPONSIBLE PARTY:  He is his own responsible party.          FUNCTIONAL STATUS:  He is fairly independent with only minor ADL assistance, as I understand things.  Normally able to propel himself in a wheelchair.   ADVANCED DIRECTIVES:  He has always been a Full Code status.     REVIEW OF SYSTEMS:            GENERAL:  The patient feels "terrible".    CHEST/RESPIRATORY:  No clear shortness of breath.     CARDIAC:  No chest pain.    GI:  No abdominal pain.  He says his ostomy is moving well.   GU:  Catheter in place.     PHYSICAL EXAMINATION:   GENERAL APPEARANCE:  The patient is awake, conversational.  He appears to be cognitively with it.  He is not happy.   CHEST/RESPIRATORY:  Fairly clear air entry bilaterally.     CARDIOVASCULAR:  CARDIAC:  Heart sounds are normal.  He appears to be euvolemic.   GASTROINTESTINAL:   ABDOMEN:  Multiple surgical scars, but no tenderness.  Bowel sounds are positive.  Colostomy noted.   GENITOURINARY:   BLADDER:  Foley catheter in place.   VASCULAR/CIRCULATION:   ARTERIAL:  Right extremity:  I wonder about his circulation here.  His peripheral pulses are difficult to feel.   His foot is cold.   NEUROLOGICAL:   SENSATION/STRENGTH:  He appears to be able to move all his limbs.   Equal strength bilaterally.  He appears generally weak, but there does not appear to be lateralizing findings.     ASSESSMENT/PLAN:                                             Acute delirium, I think felt to be secondary to drugs, possible adrenal insufficiency.    ESBL in his urine.  This was felt to be colonized and I would try to avoid antibiotics in this man unless he has systemic illness or new changes.     High-grade urothelial carcinoma.  He is status post TURBT on 09/26/2014 with a stent.  He is supposed to follow with Urology.   Repeat ultrasound during this admission showed no hydronephrosis.     Adrenal insufficiency.  On prednisone.  We will see if we are able to taper this to off.    History of a massive DVT in February 2015.  He will be on lifelong anticoagulation.  I wonder if one of the alternatives here would be better than Coumadin in so much as he is likely going to require further bladder instrumentation, etc.    Respiratory failure, I think felt to be secondary to #1.  Chest x-ray was unremarkable.    Hypothyroidism.  As I understand things, he was recently started on a small dose of Synthroid due to his TSH that was slightly over 5.  The dose of Synthroid was 12.5.  He was never on Synthroid 125 mg and I will try to change this in [facility].  Right now, I am not even sure I would have treated his TSH of 5.    Hypokalemia and hypomagnesemia.   This will need to be rechecked.    We will see if we are able to taper him off the prednisone or not.    We will need a follow-up PT and INR and lab work.    I have advised stopping his Synthroid altogether and following up his TSH in several months.     CPT CODE: [id number]

## 2014-10-16 ENCOUNTER — Other Ambulatory Visit: Payer: Self-pay | Admitting: *Deleted

## 2014-10-16 MED ORDER — PREGABALIN 75 MG PO CAPS: ORAL_CAPSULE | ORAL | Status: DC

## 2014-10-16 NOTE — Telephone Encounter (Signed)
[facility] 

## 2014-10-18 ENCOUNTER — Telehealth: Payer: Self-pay | Admitting: Oncology

## 2014-10-18 NOTE — Telephone Encounter (Signed)
Chart delivered 10-18-14 °

## 2014-10-31 ENCOUNTER — Non-Acute Institutional Stay (SKILLED_NURSING_FACILITY): Payer: Medicare Other | Admitting: Internal Medicine

## 2014-10-31 DIAGNOSIS — C689 Malignant neoplasm of urinary organ, unspecified: Secondary | ICD-10-CM

## 2014-10-31 DIAGNOSIS — G546 Phantom limb syndrome with pain: Secondary | ICD-10-CM | POA: Diagnosis not present

## 2014-10-31 DIAGNOSIS — C688 Malignant neoplasm of overlapping sites of urinary organs: Secondary | ICD-10-CM | POA: Diagnosis not present

## 2014-10-31 DIAGNOSIS — G547 Phantom limb syndrome without pain: Secondary | ICD-10-CM

## 2014-11-03 ENCOUNTER — Encounter (HOSPITAL_COMMUNITY): Payer: Self-pay | Admitting: *Deleted

## 2014-11-03 NOTE — Progress Notes (Signed)
Faxed preop[ instructions and hibiclens preop shower sheet to [facility] and confirmation received.   Darrel Reach returned call and stated she would resend to them preop instructions for Coumadin.

## 2014-11-03 NOTE — Progress Notes (Signed)
Called and spoke with [facility] in regards to fax number and received.  Also spoke with nurse to see if any preop instructions regarding Coumadin.  They do not have any preop instructions regarding Coumadin.  Left a message on voice mail of Darrel Reach at [facility] ( Surgery Scheduler) and left her a message that [facility] had no preop instructions regarding Coumadin.

## 2014-11-05 NOTE — Progress Notes (Addendum)
Patient ID: Samuel [name], male   DOB: 12-24-1955, 59 y.o.   MRN: [id number]                PROGRESS NOTE  DATE:  10/31/2014           FACILITY: [facility]                        LEVEL OF CARE:   SNF   Acute Visit    CHIEF COMPLAINT:  Left-sided pain.     HISTORY OF PRESENT ILLNESS:  Mr. Eley called me into his room today to tell me about pain he is having in his left groin area, left leg.  He has had a very proximal left above-knee amputation which is remote.  He does have a history of fairly severe phantom limb pain and he thinks that is what this is.    He also, of course, has the recent history of discovery of a urothelial bladder cancer with bilateral hydronephrosis.  He has a stent in place.  He still maintains a chronic Foley catheter.   Reviewing his most recent CT scan of the pelvis from 09/25/2014, this showed lymphadenopathy in the right external iliac chain including a 27 x 42 mm mass.  There was no periaortic lymphadenopathy.    He is apparently going to see Urology on 11/10/2014.  I wonder whether he is really going to be a candidate for a bladder removal given the fact that he has metastatic disease.       PHYSICAL EXAMINATION:   GENERAL APPEARANCE:  The patient is not in any distress.   GASTROINTESTINAL:   ABDOMEN:  Distended.  There are a lot of surgical scars.  His colostomy in the left lower quadrant is functioning well.   There is no significant tenderness that I can determine.     GENITOURINARY:   BLADDER:  No costovertebral angle tenderness is noted.    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  I think this is what he is describing, although I obviously had some thoughts about another etiology.  He has recently had his Fentanyl restarted at 25 mcg an hour.  His Lyrica was increased from 75 to 100 b.i.d.  This was on 10/18/2014.    Urothelial cancer: I don't believe this is behind any of his pain complains

## 2014-11-06 ENCOUNTER — Other Ambulatory Visit: Payer: Self-pay | Admitting: *Deleted

## 2014-11-06 MED ORDER — CLONAZEPAM 0.5 MG PO TABS
ORAL_TABLET | ORAL | Status: DC
Start: 2014-11-06 — End: 2014-12-08

## 2014-11-06 NOTE — Telephone Encounter (Signed)
[facility] 

## 2014-11-07 ENCOUNTER — Non-Acute Institutional Stay (SKILLED_NURSING_FACILITY): Payer: Medicare Other | Admitting: Internal Medicine

## 2014-11-07 DIAGNOSIS — R410 Disorientation, unspecified: Secondary | ICD-10-CM

## 2014-11-07 DIAGNOSIS — C688 Malignant neoplasm of overlapping sites of urinary organs: Secondary | ICD-10-CM | POA: Diagnosis not present

## 2014-11-07 DIAGNOSIS — N179 Acute kidney failure, unspecified: Secondary | ICD-10-CM

## 2014-11-07 DIAGNOSIS — C689 Malignant neoplasm of urinary organ, unspecified: Secondary | ICD-10-CM

## 2014-11-07 NOTE — Progress Notes (Signed)
Called requested release of orders in Epic to sign and held surgery 11-10-14 for same day surgery Thanks

## 2014-11-09 NOTE — H&P (Signed)
History of Present Illness         Follow-up. PCP Dr. Dellia Nims.     1-bladder cancer-March 2016-stage IV (T2/3 N1 M0) patient taken for TURBT, bilateral retrogrades, bilateral ureteral stent placement earlier this month. This revealed a large bladder tumor along the left bladder wall that spread across the trigone. I was able to find the UOs after resecting some of the tumor. Pathology revealed high-grade muscle invasive bladder cancer. On CT scanning there was a large right external iliac node worrisome for metastatic disease.    2-bilateral hydronephrosis-March 2016-patient status post ureteral stents earlier this month.    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 He was doing CIC but developed a decibitus ulcer. He lost some functional status and it was difficult to transfer and get in a position for CIC. He was leaking inbetween and the ulcer was getting wet. A foley was placed about 1.5 yrs ago and it is changed every 30 days or sooner if it leaks which happens on occasion.   -November 2014 normal cystoscopy    4-prostate cancer screening-He has a colostomy and no rectum (no DRE).   -Feb 2016 PSA 0.82    5-erectile dysfunction-Prior to the foley he was getting a good erection. He has no CP or NTG use. Discussed SP tube.      March 2016 interval history  Patient returns following TURBT and stent placement. He was in the hospital last week for some hypertension and altered mental status. Fortunately his kidney function was superb with a GFR greater than 90. Not surprisingly urine culture grew ESBL Klebsiella. Also he underwent a repeat renal ultrasound October 05, 2014 which was normal.    I presented the patient at multidisciplinary tumor board and plan is for neoadjuvant chemotherapy and depending on response considering curative (with max TUR) versus palliative radiation. Patient has been hesitant to consider cystectomy.     Also discussed the patient with the  hospitalist prior to his discharge.   Past Medical History Problems  1. History of Bipolar affective disorder (F31.9) 2. History of anemia (Z86.2) 3. History of arthritis (Z87.39) 4. History of esophageal reflux (Z87.19) 5. History of hyperlipidemia (Z86.39) 6. History of hypertension (Z86.79) 7. History of seizure disorder (Z86.69) 8. 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 History of Phantom limb pain (G54.7) 10. History of Presence of IVC filter (N05.397)  Surgical History Problems  1. History of Amputation Of Leg Above Knee 2. History of Appendectomy 3. History of Arm Incision 4. History of Cholecystectomy 5. History of Colon Surgery 6. History of Hip Surgery 7. History of Leg Repair  Current Meds 1. Acetaminophen 325 MG Oral Tablet;  Therapy: (Recorded:27Jan2016) to Recorded 2. AmLODIPine Besylate 5 MG Oral Tablet;  Therapy: (Recorded:27Jan2016) to Recorded 3. Amoxicillin-Pot Clavulanate 875-125 MG Oral Tablet; TAKE 1 TABLET EVERY 12 HOURS  WITH MEALS UNTIL GONE;  Therapy: [id number] to (Evaluate:13Feb2016); Last Rx:05Feb2016 Ordered 4. Antivert TABS;  Therapy: (KWIOXBDZ:32DJM4268) to Recorded 5. Atorvastatin Calcium 10 MG Oral Tablet;  Therapy: (TMHDQQIW:97LGX2119) to Recorded 6. Baclofen 20 MG Oral Tablet;  Therapy: (ERDEYCXK:48JEH6314) to Recorded 7. Chlorpheniramine Maleate 4 MG Oral Tablet;  Therapy: (HFWYOVZC:58IFO2774) to Recorded 8. ClonazePAM 1 MG Oral Tablet;  Therapy: (JOINOMVE:72CNO7096) to Recorded 9. Coumadin 5 MG Oral Tablet;  Therapy: (GEZMOQHU:76LYY5035) to Recorded 10. Cymbalta 60 MG Oral Capsule Delayed Release Particles;   Therapy: (WSFKCLEX:51ZGY1749) to Recorded 11. Dexilant 60 MG Oral Capsule Delayed Release;   Therapy: (SWHQPRFF:63WGY6599) to Recorded 12. Ditropan XL 5  MG Oral Tablet Extended Release 24 Hour;   Therapy: (GURKYHCW:23JSE8315) to Recorded 13. DuoNeb 0.5-2.5 (3) MG/3ML SOLN;   Therapy:  (Recorded:16Mar2016) to Recorded 14. Famotidine 20 MG Oral Tablet;   Therapy: (VVOHYWVP:71GGY6948) to Recorded 15. FentaNYL 50 MCG/HR Transdermal Patch 72 Hour;   Therapy: (NIOEVOJJ:00XFG1829) to Recorded 16. Ferrous Sulfate 325 MG CAPS;   Therapy: (HBZJIRCV:89FYB0175) to Recorded 17. Folic Acid TABS;   Therapy: (ZWCHENID:78EUM3536) to Recorded 18. Generlac 10 GM/15ML Oral Solution;   Therapy: (RWERXVQM:08QPY1950) to Recorded 19. K-Dur 10 MEQ TBCR;   Therapy: (DTOIZTIW:58KDX8338) to Recorded 20. LaMICtal 200 MG Oral Tablet;   Therapy: (SNKNLZJQ:73ALP3790) to Recorded 21. Lyrica 75 MG Oral Capsule;   Therapy: (WIOXBDZH:29JME2683) to Recorded 22. Metoprolol Tartrate 25 MG Oral Tablet;   Therapy: (Recorded:16Mar2016) to Recorded 23. Nitroglycerin 0.4 MG SUBL;   Therapy: (MHDQQIWL:79GXQ1194) to Recorded 24. Ondansetron HCl - 4 MG Oral Tablet;   Therapy: (Recorded:16Mar2016) to Recorded 25. Os-Cal 500 + D TABS;   Therapy: (RDEYCXKG:81EHU3149) to Recorded 26. Oxybutynin Chloride ER 5 MG Oral Tablet Extended Release 24 Hour;   Therapy: (Recorded:16Mar2016) to Recorded 27. Promethazine HCl - 12.5 MG Oral Tablet;   Therapy: (Recorded:16Mar2016) to Recorded 28. Protonix 40 MG Oral Tablet Delayed Release;   Therapy: (FWYOVZCH:88FOY7741) to Recorded 29. Reglan TABS;   Therapy: (OINOMVEH:20NOB0962) to Recorded 30. Roxicodone 15 MG Oral Tablet;   Therapy: (EZMOQHUT:65YYT0354) to Recorded 31. Senna-S TABS;   Therapy: (Recorded:27Jan2016) to Recorded 32. SEROquel 200 MG Oral Tablet;   Therapy: (Recorded:27Jan2016) to Recorded 33. Synthroid 125 MCG Oral Tablet;   Therapy: (Recorded:16Mar2016) to Recorded 34. Systane Balance SOLN;   Therapy: (SFKCLEXN:17GYF7494) to Recorded 35. Vitamin B-12 TABS;   Therapy: (Recorded:16Mar2016) to Recorded 36. Vitamin D (Ergocalciferol) 50000 UNIT Oral Capsule;   Therapy: (Recorded:27Jan2016) to Recorded 37. Warfarin Sodium 4 MG Oral Tablet;   Therapy:  (Recorded:16Mar2016) to Recorded  Allergies Medication  1. No Known Drug Allergies  Family History Problems  1. Family history of malignant neoplasm (Z80.9) : Father  Social History Problems  1. Disabled 2. Divorced 3. Former smoker [phone] 4. Number of children   2 daughters  Vitals Vital Signs [Data Includes: Last 1 Day]  Recorded: [id number] 03:30PM  Blood Pressure: 128 / 83 Temperature: 97.9 F Heart Rate: 64  Results/Data  Old records or history reviewed: epic notes labs.  The following images/tracing/specimen were independently visualized:  renal US.  Discussed:  hospitalist.    Assessment Assessed  1. Cancer of the bladder, stage IV (C67.9) 2. Hydronephrosis (N13.30)  Plan Cancer of the bladder, stage IV  1. Oncology Referral Referral  Referral - Dr. Alen Blew, [place]  Status: Hold  For - Appointment,Records  Requested for: [id number] Hydronephrosis  2. Follow-up Schedule Surgery Office  Follow-up  Status: Hold For - Appointment   Requested for: [id number]  Discussion/Summary Muscle invasive bladder cancer will refer to Dr. Alen Blew in oncology for neoadjuvant chemotherapy. Monitor response.     Bilateral hydronephrosis-advised to change his stents in this first time. Within about 6 weeks. If stable we can go longer about 3 months.    Signatures Electronically signed by : [name], M.D.; Oct 11 2014  3:51PM EST  ADD: Pt on 10 days IM Invanz.

## 2014-11-09 NOTE — Progress Notes (Signed)
Called for release of orders to Epic sign and held  For same day surgery 11-10-14 Thanks

## 2014-11-10 ENCOUNTER — Ambulatory Visit (HOSPITAL_COMMUNITY): Payer: Medicare Other

## 2014-11-10 ENCOUNTER — Encounter (HOSPITAL_COMMUNITY)
Admission: RE | Disposition: A | Discharge status: Skilled Nursing Facility | Payer: Self-pay | Source: Ambulatory Visit | Attending: Urology

## 2014-11-10 ENCOUNTER — Encounter (HOSPITAL_COMMUNITY): Payer: Self-pay | Admitting: *Deleted

## 2014-11-10 ENCOUNTER — Ambulatory Visit (HOSPITAL_COMMUNITY): Payer: Medicare Other | Admitting: Anesthesiology

## 2014-11-10 ENCOUNTER — Ambulatory Visit (HOSPITAL_COMMUNITY)
Admission: RE | Admit: 2014-11-10 | Discharge: 2014-11-10 | Disposition: A | Discharge status: Skilled Nursing Facility | Payer: Medicare Other | Source: Ambulatory Visit | Attending: Urology | Admitting: Urology

## 2014-11-10 DIAGNOSIS — C689 Malignant neoplasm of urinary organ, unspecified: Secondary | ICD-10-CM

## 2014-11-10 DIAGNOSIS — E785 Hyperlipidemia, unspecified: Secondary | ICD-10-CM | POA: Insufficient documentation

## 2014-11-10 DIAGNOSIS — G8220 Paraplegia, unspecified: Secondary | ICD-10-CM | POA: Diagnosis not present

## 2014-11-10 DIAGNOSIS — C775 Secondary and unspecified malignant neoplasm of intrapelvic lymph nodes: Secondary | ICD-10-CM

## 2014-11-10 DIAGNOSIS — I739 Peripheral vascular disease, unspecified: Secondary | ICD-10-CM | POA: Insufficient documentation

## 2014-11-10 DIAGNOSIS — M1990 Unspecified osteoarthritis, unspecified site: Secondary | ICD-10-CM | POA: Diagnosis not present

## 2014-11-10 DIAGNOSIS — Z9889 Other specified postprocedural states: Secondary | ICD-10-CM

## 2014-11-10 DIAGNOSIS — I10 Essential (primary) hypertension: Secondary | ICD-10-CM | POA: Diagnosis not present

## 2014-11-10 DIAGNOSIS — K219 Gastro-esophageal reflux disease without esophagitis: Secondary | ICD-10-CM | POA: Diagnosis not present

## 2014-11-10 DIAGNOSIS — D649 Anemia, unspecified: Secondary | ICD-10-CM | POA: Diagnosis not present

## 2014-11-10 DIAGNOSIS — Z87891 Personal history of nicotine dependence: Secondary | ICD-10-CM | POA: Insufficient documentation

## 2014-11-10 DIAGNOSIS — F319 Bipolar disorder, unspecified: Secondary | ICD-10-CM | POA: Insufficient documentation

## 2014-11-10 DIAGNOSIS — E669 Obesity, unspecified: Secondary | ICD-10-CM | POA: Insufficient documentation

## 2014-11-10 DIAGNOSIS — N1330 Unspecified hydronephrosis: Secondary | ICD-10-CM | POA: Insufficient documentation

## 2014-11-10 DIAGNOSIS — Z933 Colostomy status: Secondary | ICD-10-CM | POA: Insufficient documentation

## 2014-11-10 DIAGNOSIS — C679 Malignant neoplasm of bladder, unspecified: Secondary | ICD-10-CM

## 2014-11-10 DIAGNOSIS — Z6841 Body Mass Index (BMI) 40.0 and over, adult: Secondary | ICD-10-CM | POA: Diagnosis not present

## 2014-11-10 DIAGNOSIS — G40909 Epilepsy, unspecified, not intractable, without status epilepticus: Secondary | ICD-10-CM | POA: Diagnosis not present

## 2014-11-10 DIAGNOSIS — Z79899 Other long term (current) drug therapy: Secondary | ICD-10-CM | POA: Insufficient documentation

## 2014-11-10 DIAGNOSIS — Z7901 Long term (current) use of anticoagulants: Secondary | ICD-10-CM | POA: Diagnosis not present

## 2014-11-10 HISTORY — DX: Other pulmonary embolism without acute cor pulmonale: I26.99

## 2014-11-10 HISTORY — PX: CYSTOSCOPY WITH STENT PLACEMENT: SHX5790

## 2014-11-10 HISTORY — DX: Unspecified adrenocortical insufficiency: E27.40

## 2014-11-10 HISTORY — DX: Complete traumatic amputation at level between left hip and knee, initial encounter: S78.112A

## 2014-11-10 LAB — PROTIME-INR
INR: 1.21 (ref 0.00–1.49)
PROTHROMBIN TIME: 15.4 s — AB (ref 11.6–15.2)

## 2014-11-10 LAB — SURGICAL PCR SCREEN
MRSA, PCR: NEGATIVE
Staphylococcus aureus: POSITIVE — AB

## 2014-11-10 LAB — APTT: aPTT: 27 seconds (ref 24–37)

## 2014-11-10 SURGERY — CYSTOSCOPY, WITH STENT INSERTION
Anesthesia: General | Laterality: Bilateral

## 2014-11-10 MED ORDER — SODIUM CHLORIDE 0.9 % IR SOLN
Status: DC | PRN
  Administered 2014-11-10: 3000 mL

## 2014-11-10 MED ORDER — METOCLOPRAMIDE HCL 5 MG/ML IJ SOLN
INTRAMUSCULAR | Status: AC
  Filled 2014-11-10: qty 2

## 2014-11-10 MED ORDER — LACTATED RINGERS IV SOLN
INTRAVENOUS | Status: DC
  Administered 2014-11-10: 14:00:00 via INTRAVENOUS
  Administered 2014-11-10: 1000 mL via INTRAVENOUS

## 2014-11-10 MED ORDER — OXYCODONE HCL 5 MG PO TABS
10.0000 mg | ORAL_TABLET | ORAL | Status: AC
  Administered 2014-11-10: 10 mg via ORAL
  Filled 2014-11-10: qty 2

## 2014-11-10 MED ORDER — FENTANYL CITRATE (PF) 100 MCG/2ML IJ SOLN
INTRAMUSCULAR | Status: AC
  Filled 2014-11-10: qty 2

## 2014-11-10 MED ORDER — PHENYLEPHRINE 40 MCG/ML (10ML) SYRINGE FOR IV PUSH (FOR BLOOD PRESSURE SUPPORT)
PREFILLED_SYRINGE | INTRAVENOUS | Status: AC
  Filled 2014-11-10: qty 10

## 2014-11-10 MED ORDER — FENTANYL CITRATE (PF) 100 MCG/2ML IJ SOLN
INTRAMUSCULAR | Status: DC | PRN
  Administered 2014-11-10 (×2): 50 ug via INTRAVENOUS

## 2014-11-10 MED ORDER — LIDOCAINE HCL (CARDIAC) 20 MG/ML IV SOLN
INTRAVENOUS | Status: AC
  Filled 2014-11-10: qty 5

## 2014-11-10 MED ORDER — FENTANYL CITRATE (PF) 100 MCG/2ML IJ SOLN
50.0000 ug | INTRAMUSCULAR | Status: DC | PRN
  Administered 2014-11-10: 50 ug via INTRAVENOUS
  Administered 2014-11-10: 100 ug via INTRAVENOUS

## 2014-11-10 MED ORDER — MIDAZOLAM HCL 2 MG/2ML IJ SOLN
INTRAMUSCULAR | Status: AC
  Filled 2014-11-10: qty 2

## 2014-11-10 MED ORDER — PHENYLEPHRINE 40 MCG/ML (10ML) SYRINGE FOR IV PUSH (FOR BLOOD PRESSURE SUPPORT)
PREFILLED_SYRINGE | INTRAVENOUS | Status: AC
  Filled 2014-11-10: qty 20

## 2014-11-10 MED ORDER — METOCLOPRAMIDE HCL 5 MG/ML IJ SOLN
INTRAMUSCULAR | Status: DC | PRN
  Administered 2014-11-10: 10 mg via INTRAVENOUS

## 2014-11-10 MED ORDER — ONDANSETRON HCL 4 MG/2ML IJ SOLN
INTRAMUSCULAR | Status: AC
  Filled 2014-11-10: qty 2

## 2014-11-10 MED ORDER — PROPOFOL 10 MG/ML IV BOLUS
INTRAVENOUS | Status: AC
  Filled 2014-11-10: qty 20

## 2014-11-10 MED ORDER — SODIUM CHLORIDE 0.9 % IV SOLN
1.0000 g | INTRAVENOUS | Status: AC
  Administered 2014-11-10: 1 g via INTRAVENOUS
  Filled 2014-11-10 (×2): qty 1

## 2014-11-10 MED ORDER — MUPIROCIN 2 % EX OINT
TOPICAL_OINTMENT | Freq: Two times a day (BID) | CUTANEOUS | Status: DC
  Administered 2014-11-10: 08:00:00 via NASAL
  Filled 2014-11-10: qty 22

## 2014-11-10 MED ORDER — PROMETHAZINE HCL 25 MG/ML IJ SOLN: 6.2500 mg | INTRAMUSCULAR | Status: DC | PRN

## 2014-11-10 MED ORDER — LIDOCAINE HCL (CARDIAC) 20 MG/ML IV SOLN
INTRAVENOUS | Status: DC | PRN
  Administered 2014-11-10: 50 mg via INTRAVENOUS

## 2014-11-10 MED ORDER — ONDANSETRON HCL 4 MG/2ML IJ SOLN
INTRAMUSCULAR | Status: DC | PRN
  Administered 2014-11-10: 4 mg via INTRAVENOUS

## 2014-11-10 MED ORDER — PHENYLEPHRINE HCL 10 MG/ML IJ SOLN
INTRAMUSCULAR | Status: DC | PRN
  Administered 2014-11-10: 120 ug via INTRAVENOUS
  Administered 2014-11-10: 40 ug via INTRAVENOUS
  Administered 2014-11-10 (×2): 120 ug via INTRAVENOUS
  Administered 2014-11-10: 80 ug via INTRAVENOUS
  Administered 2014-11-10 (×2): 120 ug via INTRAVENOUS
  Administered 2014-11-10 (×2): 40 ug via INTRAVENOUS
  Administered 2014-11-10: 120 ug via INTRAVENOUS

## 2014-11-10 MED ORDER — MIDAZOLAM HCL 5 MG/5ML IJ SOLN
INTRAMUSCULAR | Status: DC | PRN
  Administered 2014-11-10 (×2): 2 mg via INTRAVENOUS

## 2014-11-10 MED ORDER — FENTANYL CITRATE (PF) 100 MCG/2ML IJ SOLN: 25.0000 ug | INTRAMUSCULAR | Status: DC | PRN

## 2014-11-10 MED ORDER — IOHEXOL 300 MG/ML  SOLN
INTRAMUSCULAR | Status: DC | PRN
  Administered 2014-11-10: 4 mL via URETHRAL

## 2014-11-10 SURGICAL SUPPLY — 21 items
BAG URINE DRAINAGE (UROLOGICAL SUPPLIES) ×3 IMPLANT
BAG URO CATCHER STRL LF (DRAPE) ×3 IMPLANT
BASKET STNLS GEMINI 4WIRE 3FR (BASKET) IMPLANT
BASKET ZERO TIP NITINOL 2.4FR (BASKET) IMPLANT
CATH FOLEY 2WAY 5CC 20FR (CATHETERS) ×3 IMPLANT
CATH INTERMIT  6FR 70CM (CATHETERS) ×3 IMPLANT
CATH URET 5FR 28IN CONE TIP (BALLOONS)
CATH URET 5FR 70CM CONE TIP (BALLOONS) IMPLANT
CATH URET DUAL LUMEN 6-10FR 50 (CATHETERS) IMPLANT
CLOTH BEACON ORANGE TIMEOUT ST (SAFETY) ×3 IMPLANT
EXTRACTOR STONE NITINOL NGAGE (UROLOGICAL SUPPLIES) IMPLANT
GLOVE BIO SURGEON STRL SZ7.5 (GLOVE) ×15 IMPLANT
GOWN STRL REUS W/TWL XL LVL3 (GOWN DISPOSABLE) ×9 IMPLANT
GUIDEWIRE ANG ZIPWIRE 038X150 (WIRE) ×3 IMPLANT
GUIDEWIRE STR DUAL SENSOR (WIRE) ×3 IMPLANT
MANIFOLD NEPTUNE II (INSTRUMENTS) ×3 IMPLANT
PACK CYSTO (CUSTOM PROCEDURE TRAY) ×3 IMPLANT
STENT CONTOUR 6FRX26X.038 (STENTS) ×6 IMPLANT
TUBING CONNECTING 10 (TUBING) ×2 IMPLANT
TUBING CONNECTING 10' (TUBING) ×1
WIRE COONS/BENSON .038X145CM (WIRE) IMPLANT

## 2014-11-10 NOTE — Progress Notes (Signed)
Pt refuses to take his metoprolol on an empty stomach. Last dose 11/09/14 at 2100.

## 2014-11-10 NOTE — Anesthesia Preprocedure Evaluation (Signed)
Anesthesia Evaluation  Patient identified by MRN, date of birth, ID band Patient awake    Reviewed: Allergy & Precautions, NPO status , Patient's Chart, lab work & pertinent test results  Airway Mallampati: II  TM Distance: >3 FB Neck ROM: Full    Dental no notable dental hx.    Pulmonary shortness of breath, pneumonia -, resolved, former smoker,  breath sounds clear to auscultation  Pulmonary exam normal       Cardiovascular hypertension, Pt. on medications + Peripheral Vascular Disease Rhythm:Regular Rate:Normal     Neuro/Psych Seizures -,  PSYCHIATRIC DISORDERS Bipolar Disorder paraplegic  Neuromuscular disease    GI/Hepatic negative GI ROS, Neg liver ROS, GERD-  Medicated,(+) Hepatitis -  Endo/Other  negative endocrine ROSMorbid obesity  Renal/GU Renal disease  negative genitourinary   Musculoskeletal negative musculoskeletal ROS (+)   Abdominal (+) + obese,   Peds negative pediatric ROS (+)  Hematology  (+) anemia ,   Anesthesia Other Findings   Reproductive/Obstetrics negative OB ROS                             Anesthesia Physical Anesthesia Plan  ASA: III  Anesthesia Plan: General   Post-op Pain Management:    Induction: Intravenous  Airway Management Planned: Oral ETT  Additional Equipment:   Intra-op Plan:   Post-operative Plan: Extubation in OR  Informed Consent: I have reviewed the patients History and Physical, chart, labs and discussed the procedure including the risks, benefits and alternatives for the proposed anesthesia with the patient or authorized representative who has indicated his/her understanding and acceptance.   Dental advisory given  Plan Discussed with: CRNA  Anesthesia Plan Comments:         Anesthesia Quick Evaluation

## 2014-11-10 NOTE — Anesthesia Procedure Notes (Signed)
Procedure Name: Intubation Date/Time: 11/10/2014 12:48 PM Performed by: Deliah Boston Pre-anesthesia Checklist: Patient identified, Emergency Drugs available, Suction available and Patient being monitored Patient Re-evaluated:Patient Re-evaluated prior to inductionOxygen Delivery Method: Circle System Utilized Preoxygenation: Pre-oxygenation with 100% oxygen Intubation Type: IV induction Ventilation: Mask ventilation without difficulty Laryngoscope Size: Glidescope and 4 Grade View: Grade I Tube type: Oral Tube size: 7.5 mm Number of attempts: 1 Airway Equipment and Method: Stylet,  Oral airway and Video-laryngoscopy Placement Confirmation: ETT inserted through vocal cords under direct vision,  positive ETCO2 and breath sounds checked- equal and bilateral Secured at: 22 cm Tube secured with: Tape Dental Injury: Teeth and Oropharynx as per pre-operative assessment  Comments: Glide scope used electively.

## 2014-11-10 NOTE — Discharge Instructions (Signed)
Ureteral Stent Implantation, Care After - RIGHT AND LEFT STENTS Refer to this sheet in the next few weeks. These instructions provide you with information on caring for yourself after your procedure. Your health care provider may also give you more specific instructions. Your treatment has been planned according to current medical practices, but problems sometimes occur. Call your health care provider if you have any problems or questions after your procedure. WHAT TO EXPECT AFTER THE PROCEDURE You should be back to normal activity within 48 hours after the procedure. Nausea and vomiting may occur and are commonly the result of anesthesia. It is common to experience sharp pain in the back or lower abdomen and penis with voiding. This is caused by movement of the ends of the stent with the act of urinating.It usually goes away within minutes after you have stopped urinating. HOME CARE INSTRUCTIONS Make sure to drink plenty of fluids. You may have small amounts of bleeding, causing your urine to be red. This is normal. Certain movements may trigger pain or a feeling that you need to urinate. You may be given medicines to prevent infection or bladder spasms. Be sure to take all medicines as directed. Only take over-the-counter or prescription medicines for pain, discomfort, or fever as directed by your health care provider. Do not take aspirin, as this can make bleeding worse.  Your stents will be left in until the blockage is resolved. Be sure to keep all follow-up appointments so your health care provider can check that you are healing properly.  SEEK MEDICAL CARE IF:  You experience increasing pain.  Your pain medicine is not working. SEEK IMMEDIATE MEDICAL CARE IF:  Your urine is dark red or has blood clots.  You are leaking urine (incontinent).  You have a fever, chills, feeling sick to your stomach (nausea), or vomiting.  Your pain is not relieved by pain medicine.  The end of the stent  comes out of the urethra.  You are unable to urinate. Document Released: 03/16/2013 Document Revised: 07/19/2013 Document Reviewed: 03/16/2013 [facility] Patient Information 2015 [place], [place]. This information is not intended to replace advice given to you by your health care provider. Make sure you discuss any questions you have with your health care provider.      General Anesthesia, Care After Refer to this sheet in the next few weeks. These instructions provide you with information on caring for yourself after your procedure. Your health care provider may also give you more specific instructions. Your treatment has been planned according to current medical practices, but problems sometimes occur. Call your health care provider if you have any problems or questions after your procedure. WHAT TO EXPECT AFTER THE PROCEDURE After the procedure, it is typical to experience:  Sleepiness.  Nausea and vomiting. HOME CARE INSTRUCTIONS  For the first 24 hours after general anesthesia:  Have a responsible person with you.  Do not drive a car. If you are alone, do not take public transportation.  Do not drink alcohol.  Do not take medicine that has not been prescribed by your health care provider.  Do not sign important papers or make important decisions.  You may resume a normal diet and activities as directed by your health care provider.  Change bandages (dressings) as directed.  If you have questions or problems that seem related to general anesthesia, call the hospital and ask for the anesthetist or anesthesiologist on call. SEEK MEDICAL CARE IF:  You have nausea and vomiting that continue  the day after anesthesia.  You develop a rash. SEEK IMMEDIATE MEDICAL CARE IF:   You have difficulty breathing.  You have chest pain.  You have any allergic problems. Document Released: 10/20/2000 Document Revised: 07/19/2013 Document Reviewed: 01/27/2013 [facility] Patient  Information 2015 [place], [place]. This information is not intended to replace advice given to you by your health care provider. Make sure you discuss any questions you have with your health care provider.

## 2014-11-10 NOTE — Op Note (Signed)
Preoperative diagnosis: Bladder cancer, bilateral hydronephrosis Postoperative diagnosis: Stage IV bladder cancer (T4 N1 M0), bilateral hydronephrosis  Procedure: Cystoscopy, left retrograde pyelogram, Bilateral stent exchange  Surgeon: Kelsha Older  Type of anesthesia: Gen.  Indication for procedure: Samuel [name] is a 59 year old with locally advanced bladder cancer. He will proceed with chemotherapy soon and it has been about 6 weeks since his stents were placed. He was brought today for cystoscopy with bilateral ureteral stent exchange to place new stents before he gets chemotherapy. I wanted him to have new stents in the Foley catheter to limit colonization and risk of infection. In fact just this week he was started on IM Invanz and now is a good time to change his stents while he is on antibiotics.  Findings: On cystoscopy there is classic early appearing urothelial carcinoma in the stool bulbar urethra as well as shaggy appearing mucosa in the prostatic urethra extending down from the trigone. The stents were in good position.   Left retrograde pyelogram-this outlined a dilated left renal pelvis and collecting system confirming proper wire placement in the lumen.   Description of procedure: After consent was obtained patient brought to the operating room. After adequate anesthesia his right leg was placed in lithotomy position he was prepped and draped in the usual sterile fashion. The cystoscope was passed per urethra and an angled glide wire was guided beside the left ureteral stent coiled in the collecting system. The scope was removed and replaced and the left ureteral stent grasped and removed without difficulty. A 6 French open-ended catheter was advanced over the wire into the region of the left proximal ureter and the wire removed. Retrograde injection of contrast confirm placement of catheter in the lumen. The wire was replaced and the 6 [place] open-ended catheter removed. A 6 x 26 and  ureter stent was advanced. The wire was removed with good coil seen in the kidney and the beginning of a coil in the bladder. I used the graspers to pull out more of the stent to create a good coil in the bladder with the stent running along the left bladder tumor for a few centimeters. The coil in the left collecting system remained appropriately developed.  The right ureteral stent was grasped and removed through the urethral meatus through which a Glidewire was advanced without difficulty. It coiled appropriately in the collecting system. Therefore a 626 cm stent was advanced over the wire. The wire was removed with a good coil seen in the collecting system and a good coil in the bladder. The bladder was drained and then refilled. The scope was removed. A 20 [place] coud catheter was placed the balloon inflated with 10 mL and seated at the bladder neck. Urine was clear.  The patient was awakened taken to recovery room in stable condition. He was covered today with 1 g of IV meropenem.  Complications: None Blood loss: Minimal Specimens: None  Drains: 6 x 26 cm bilateral ureteral stents  Disposition: Patient stable to PACU

## 2014-11-10 NOTE — Anesthesia Postprocedure Evaluation (Signed)
  Anesthesia Post-op Note  Patient: Samuel [name]  Procedure(s) Performed: Procedure(s) (LRB): CYSTOSCOPY BILATERAL  STENT EXCHANGE, LEFT RETROGRADE (Bilateral)  Patient Location: PACU  Anesthesia Type: General  Level of Consciousness: awake and alert   Airway and Oxygen Therapy: Patient Spontanous Breathing  Post-op Pain: mild  Post-op Assessment: Post-op Vital signs reviewed, Patient's Cardiovascular Status Stable, Respiratory Function Stable, Patent Airway and No signs of Nausea or vomiting  Last Vitals:  Filed Vitals:   11/10/14 1442  BP: 136/78  Pulse: 90  Temp: 36.9 C  Resp: 12    Post-op Vital Signs: stable   Complications: No apparent anesthesia complications

## 2014-11-10 NOTE — Transfer of Care (Signed)
Immediate Anesthesia Transfer of Care Note  Patient: Samuel [name]  Procedure(s) Performed: Procedure(s): CYSTOSCOPY BILATERAL  STENT EXCHANGE, LEFT RETROGRADE (Bilateral)  Patient Location: PACU  Anesthesia Type:General  Level of Consciousness: Patient easily awoken, sedated, comfortable, cooperative, following commands, responds to stimulation.   Airway & Oxygen Therapy: Patient spontaneously breathing, ventilating well, oxygen via simple oxygen mask.  Post-op Assessment: Report given to PACU RN, vital signs reviewed and stable, moving all extremities.   Post vital signs: Reviewed and stable.  Complications: No apparent anesthesia complications

## 2014-11-10 NOTE — Interval H&P Note (Signed)
History and Physical Interval Note:  11/10/2014 11:16 AM  Samuel [name]  has presented today for surgery, with the diagnosis of BILATERAL HYDRONEPHROSIS  The various methods of treatment have been discussed with the patient and family. After consideration of risks, benefits and other options for treatment, the patient has consented to  Procedure(s): CYSTOSCOPY BILATERAL URETERAL   WITH STENT PLACEMENT (Bilateral) as a surgical intervention .  The patient's history has been reviewed, patient examined, no change in status, stable for surgery.  I have reviewed the patient's chart and labs.  Questions were answered to the patient's satisfaction.  Pt on IV Invanz per reports. Last dose 1400 yesterday. Plan meropenem today. Discussed rationale for changing stents, putting in new / clean stents before pt begins chemotherapy while he is stable. He looks and feels well today. Urine clear.    [place]

## 2014-11-10 NOTE — Progress Notes (Signed)
Report called to Merita Norton LPN at [facility] and [employer]

## 2014-11-10 NOTE — Progress Notes (Signed)
Last med doses per Edward Plainfield LPN

## 2014-11-13 ENCOUNTER — Encounter (HOSPITAL_COMMUNITY): Payer: Self-pay | Admitting: Urology

## 2014-11-13 MED ORDER — PROPOFOL 10 MG/ML IV BOLUS
INTRAVENOUS | Status: DC | PRN
  Administered 2014-11-10: 250 mg via INTRAVENOUS

## 2014-11-13 NOTE — Addendum Note (Signed)
Addendum  created 11/13/14 1253 by Deliah Boston, CRNA   Modules edited: Anesthesia Medication Administration

## 2014-11-13 NOTE — Progress Notes (Addendum)
Patient ID: Samuel [name], male   DOB: 1956/07/15, 59 y.o.   MRN: [id number]                PROGRESS NOTE  DATE:  11/07/2014             FACILITY: [facility]                   LEVEL OF CARE:   SNF   Acute Visit               CHIEF COMPLAINT:  Follow up multiple calls from Samuel [name] with regards to delirium/altered LOC.     HISTORY OF PRESENT ILLNESS:  Samuel [name] is a gentleman who was hospitalized in mid February for a multifactorial delirium felt to be secondary to medications, possible UTI (although the final thought on this was colonization), adrenal insufficiency.    On top of this, he has a recent diagnosis of what I am reading as metastatic urothelial carcinoma.  He had some of this resected on 09/26/2014 with ureteral stent.  He had a renal ultrasound recently that did not show hydronephrosis (improved).  He is supposed to go back, I think, for changing of the stent later this week.  I discussed this with regards to his anticoagulation with the [name].   Given the severity of his underlying PE which I think was in the 14 months ago range, and the fact that he now has a malignancy, we elected to transition him from Coumadin to Lovenox and then we will re-transition him when it is deemed safe after his procedure.    I was called last week to a report that once again he had an acute delirium.  Lab work from this time showed a BUN of 28 and a creatinine of 2.35, a white count of 10.4 and a hemoglobin of 10.1.  We attempted to get an IV on him, but this could not be started.  We put all of his psychoactive medications including the Fentanyl, Lyrica, Seroquel, and Lamictal on hold.  I think we also gave him Invanz q.24 x10 days.  A urine culture result is pending.    In the meantime, the patient has come back to his usual self.  His psychiatric medications have been restarted.    REVIEW OF SYSTEMS:    CHEST/RESPIRATORY:  No shortness of breath.    CARDIAC:  No chest pain.     GI:  No nausea or vomiting.  No diarrhea.   GU:  Pelvic:  He has no pelvic discomfort or flank pain.    PHYSICAL EXAMINATION:   GENERAL APPEARANCE:  The patient appears back to his old self.  Alert, conversational.   CHEST/RESPIRATORY:  Clear air entry bilaterally.    CARDIOVASCULAR:   CARDIAC:  Heart sounds are normal.  There are no murmurs.    GASTROINTESTINAL:   ABDOMEN:  Multiple surgical scars.  Colostomy noted.  He has no other masses.   No tenderness.  No guarding.  No rebound.   GENITOURINARY:   BLADDER:  Foley catheter in place.      ASSESSMENT/PLAN:                             Acute renal failure.  Felt to be prerenal.  This seems to have corrected.  Lab work from yesterday showed a BUN of 9 and a creatinine of 1.21.  I must say, I am a  bit surprised.  However, this was done without IV fluids.     ?UTI.  He has previously grown a multidrug resistant Klebsiella, which I think ultimately was felt to be a colonizer in the hospital.  Nevertheless, until I see the results here, I am going to continue the Samuel [name] as ordered.    Delirium.  I suspect this is drug-induced, probably the Fentanyl, and I would not restart this.   He seems to be tolerating reintroduction of his Seroquel, Lamictal, Lyrica.  If he needs narcotics another    Going for a surgical, I think, stent change later in the week.  He has a urothelial cancer.  Apparently, the stent will be changed.  He also has a follow-up appointment with Oncology, although I do not exactly know when that is.  I have asked the [name] to call Urology and let them know about the recent problems.  He is, no doubt, going to have resistant organisms in his urine.  Whether this was an actual infection versus colonization, I am unsure.  The patient states he has a temperature of 102, although I do not remember that being told to me on the phone.  The final urine culture, again, is pending at the time of this dictation.    History of hepatitis  C.  This goes back to before he came into the building.  He came with this diagnosis.  He claims that he has been to see liver specialists who told him that this was not correct.  I do not actually see any hepatitis C antibodies here in the [facility].   I know that I have not ordered them.  He was tested for HIV about a year ago.  This was negative.      CPT CODE: 29021

## 2014-11-16 ENCOUNTER — Other Ambulatory Visit: Payer: Self-pay | Admitting: *Deleted

## 2014-11-16 MED ORDER — PREGABALIN 75 MG PO CAPS: ORAL_CAPSULE | ORAL | Status: DC

## 2014-11-16 MED ORDER — OXYCODONE HCL 10 MG PO TABS: ORAL_TABLET | ORAL | Status: DC

## 2014-11-16 NOTE — Telephone Encounter (Signed)
[facility] 

## 2014-11-22 ENCOUNTER — Other Ambulatory Visit: Payer: Self-pay

## 2014-11-22 ENCOUNTER — Ambulatory Visit (HOSPITAL_BASED_OUTPATIENT_CLINIC_OR_DEPARTMENT_OTHER): Payer: Medicare Other | Admitting: Oncology

## 2014-11-22 ENCOUNTER — Ambulatory Visit: Payer: Medicare Other

## 2014-11-22 VITALS — BP 110/64 | HR 103 | Temp 97.7°F | Resp 18 | Ht 65.0 in

## 2014-11-22 DIAGNOSIS — C679 Malignant neoplasm of bladder, unspecified: Secondary | ICD-10-CM | POA: Diagnosis not present

## 2014-11-22 DIAGNOSIS — C689 Malignant neoplasm of urinary organ, unspecified: Secondary | ICD-10-CM

## 2014-11-22 DIAGNOSIS — N1330 Unspecified hydronephrosis: Secondary | ICD-10-CM

## 2014-11-22 DIAGNOSIS — G894 Chronic pain syndrome: Secondary | ICD-10-CM

## 2014-11-22 DIAGNOSIS — I10 Essential (primary) hypertension: Secondary | ICD-10-CM

## 2014-11-22 DIAGNOSIS — L89159 Pressure ulcer of sacral region, unspecified stage: Secondary | ICD-10-CM

## 2014-11-22 DIAGNOSIS — G8220 Paraplegia, unspecified: Secondary | ICD-10-CM | POA: Diagnosis not present
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
## 2014-11-22 NOTE — Progress Notes (Signed)
Please see consult note.  

## 2014-11-23 ENCOUNTER — Telehealth: Payer: Self-pay | Admitting: Oncology

## 2014-11-23 NOTE — Telephone Encounter (Signed)
s.w pt and confirmed appts....pt advised me to call [facility] [phone] and give appts...Marland Kitchenhe also advised to made [facility] # main number on his account.

## 2014-11-24 ENCOUNTER — Other Ambulatory Visit: Payer: Self-pay | Admitting: *Deleted

## 2014-11-24 ENCOUNTER — Telehealth: Payer: Self-pay | Admitting: *Deleted

## 2014-11-24 NOTE — Telephone Encounter (Signed)
This RN spoke with patient's nurse at [facility], [phone], and verified that patient is no longer taking Lovenox injections. Patient is taking Coumadin daily. This message to be given to Radiology at [facility].  Per Dr.Shadad and Awilda Metro, PA, "it's OK to hold Coumadin for 4 days prior to port placement."

## 2014-11-27 ENCOUNTER — Other Ambulatory Visit: Payer: Self-pay | Admitting: Radiology

## 2014-11-28 ENCOUNTER — Ambulatory Visit (HOSPITAL_COMMUNITY)
Admission: RE | Admit: 2014-11-28 | Discharge: 2014-11-28 | Disposition: A | Discharge status: Home / Self Care | Payer: Medicare Other | Source: Ambulatory Visit | Attending: Interventional Radiology | Admitting: Interventional Radiology

## 2014-11-28 ENCOUNTER — Other Ambulatory Visit: Payer: Self-pay | Admitting: Oncology

## 2014-11-28 ENCOUNTER — Encounter (HOSPITAL_COMMUNITY): Payer: Self-pay

## 2014-11-28 ENCOUNTER — Ambulatory Visit (HOSPITAL_COMMUNITY)
Admission: RE | Admit: 2014-11-28 | Discharge: 2014-11-28 | Disposition: A | Discharge status: Home / Self Care | Payer: Medicare Other | Source: Ambulatory Visit | Attending: Oncology | Admitting: Oncology

## 2014-11-28 DIAGNOSIS — Z79899 Other long term (current) drug therapy: Secondary | ICD-10-CM | POA: Diagnosis not present

## 2014-11-28 DIAGNOSIS — D649 Anemia, unspecified: Secondary | ICD-10-CM | POA: Insufficient documentation

## 2014-11-28 DIAGNOSIS — Z86711 Personal history of pulmonary embolism: Secondary | ICD-10-CM | POA: Insufficient documentation

## 2014-11-28 DIAGNOSIS — C688 Malignant neoplasm of overlapping sites of urinary organs: Secondary | ICD-10-CM | POA: Diagnosis not present

## 2014-11-28 DIAGNOSIS — B1920 Unspecified viral hepatitis C without hepatic coma: Secondary | ICD-10-CM | POA: Insufficient documentation

## 2014-11-28 DIAGNOSIS — N319 Neuromuscular dysfunction of bladder, unspecified: Secondary | ICD-10-CM | POA: Insufficient documentation

## 2014-11-28 DIAGNOSIS — F319 Bipolar disorder, unspecified: Secondary | ICD-10-CM | POA: Insufficient documentation

## 2014-11-28 DIAGNOSIS — E785 Hyperlipidemia, unspecified: Secondary | ICD-10-CM | POA: Diagnosis not present

## 2014-11-28 DIAGNOSIS — Z452 Encounter for adjustment and management of vascular access device: Secondary | ICD-10-CM | POA: Insufficient documentation

## 2014-11-28 DIAGNOSIS — Z87891 Personal history of nicotine dependence: Secondary | ICD-10-CM | POA: Diagnosis not present

## 2014-11-28 DIAGNOSIS — K219 Gastro-esophageal reflux disease without esophagitis: Secondary | ICD-10-CM | POA: Diagnosis not present

## 2014-11-28 DIAGNOSIS — E2740 Unspecified adrenocortical insufficiency: Secondary | ICD-10-CM | POA: Diagnosis not present

## 2014-11-28 DIAGNOSIS — E669 Obesity, unspecified: Secondary | ICD-10-CM | POA: Insufficient documentation

## 2014-11-28 DIAGNOSIS — I10 Essential (primary) hypertension: Secondary | ICD-10-CM | POA: Diagnosis not present

## 2014-11-28 DIAGNOSIS — C689 Malignant neoplasm of urinary organ, unspecified: Secondary | ICD-10-CM

## 2014-11-28 DIAGNOSIS — Z89612 Acquired absence of left leg above knee: Secondary | ICD-10-CM | POA: Diagnosis not present

## 2014-11-28 DIAGNOSIS — G8220 Paraplegia, unspecified: Secondary | ICD-10-CM | POA: Diagnosis not present

## 2014-11-28 DIAGNOSIS — G8929 Other chronic pain: Secondary | ICD-10-CM | POA: Insufficient documentation

## 2014-11-28 LAB — CBC WITH DIFFERENTIAL/PLATELET
Basophils Absolute: 0.2 10*3/uL — ABNORMAL HIGH (ref 0.0–0.1)
Basophils Relative: 2 % — ABNORMAL HIGH (ref 0–1)
EOS ABS: 1 10*3/uL — AB (ref 0.0–0.7)
EOS PCT: 9 % — AB (ref 0–5)
HCT: 33 % — ABNORMAL LOW (ref 39.0–52.0)
HEMOGLOBIN: 10.4 g/dL — AB (ref 13.0–17.0)
Lymphocytes Relative: 14 % (ref 12–46)
Lymphs Abs: 1.6 10*3/uL (ref 0.7–4.0)
MCH: 25.7 pg — AB (ref 26.0–34.0)
MCHC: 31.5 g/dL (ref 30.0–36.0)
MCV: 81.7 fL (ref 78.0–100.0)
MONOS PCT: 9 % (ref 3–12)
Monocytes Absolute: 1 10*3/uL (ref 0.1–1.0)
NEUTROS PCT: 66 % (ref 43–77)
Neutro Abs: 7.6 10*3/uL (ref 1.7–7.7)
Platelets: 454 10*3/uL — ABNORMAL HIGH (ref 150–400)
RBC: 4.04 MIL/uL — ABNORMAL LOW (ref 4.22–5.81)
RDW: 14.9 % (ref 11.5–15.5)
WBC: 11.3 10*3/uL — ABNORMAL HIGH (ref 4.0–10.5)

## 2014-11-28 LAB — PROTIME-INR
INR: 1.21 (ref 0.00–1.49)
Prothrombin Time: 15.5 seconds — ABNORMAL HIGH (ref 11.6–15.2)

## 2014-11-28 LAB — APTT: aPTT: 37 seconds (ref 24–37)

## 2014-11-28 MED ORDER — HEPARIN SOD (PORK) LOCK FLUSH 100 UNIT/ML IV SOLN
INTRAVENOUS | Status: AC | PRN
  Administered 2014-11-28: 500 [IU]

## 2014-11-28 MED ORDER — CEFAZOLIN SODIUM-DEXTROSE 2-3 GM-% IV SOLR
2.0000 g | INTRAVENOUS | Status: AC
  Administered 2014-11-28: 2 g via INTRAVENOUS

## 2014-11-28 MED ORDER — HEPARIN SOD (PORK) LOCK FLUSH 100 UNIT/ML IV SOLN
INTRAVENOUS | Status: AC
  Filled 2014-11-28: qty 5

## 2014-11-28 MED ORDER — CEFAZOLIN SODIUM-DEXTROSE 2-3 GM-% IV SOLR
INTRAVENOUS | Status: AC
  Filled 2014-11-28: qty 50

## 2014-11-28 MED ORDER — LIDOCAINE HCL 1 % IJ SOLN
INTRAMUSCULAR | Status: AC
  Filled 2014-11-28: qty 20

## 2014-11-28 MED ORDER — SODIUM CHLORIDE 0.9 % IV SOLN
INTRAVENOUS | Status: DC
  Administered 2014-11-28: 13:00:00 via INTRAVENOUS

## 2014-11-28 MED ORDER — FENTANYL CITRATE (PF) 100 MCG/2ML IJ SOLN
INTRAMUSCULAR | Status: AC
  Filled 2014-11-28: qty 4

## 2014-11-28 MED ORDER — MIDAZOLAM HCL 2 MG/2ML IJ SOLN
INTRAMUSCULAR | Status: AC
  Filled 2014-11-28: qty 6

## 2014-11-28 MED ORDER — MIDAZOLAM HCL 2 MG/2ML IJ SOLN
INTRAMUSCULAR | Status: AC | PRN
  Administered 2014-11-28: 1 mg via INTRAVENOUS
  Administered 2014-11-28 (×2): 0.5 mg via INTRAVENOUS

## 2014-11-28 MED ORDER — FENTANYL CITRATE (PF) 100 MCG/2ML IJ SOLN
INTRAMUSCULAR | Status: AC | PRN
  Administered 2014-11-28 (×2): 25 ug via INTRAVENOUS
  Administered 2014-11-28: 50 ug via INTRAVENOUS

## 2014-11-28 NOTE — Discharge Instructions (Signed)
Conscious Sedation Sedation is the use of medicines to promote relaxation and relieve discomfort and anxiety. Conscious sedation is a type of sedation. Under conscious sedation you are less alert than normal but are still able to respond to instructions or stimulation. Conscious sedation is used during short medical and dental procedures. It is milder than deep sedation or general anesthesia and allows you to return to your regular activities sooner.  LET [facility] CARE PROVIDER KNOW ABOUT:   Any allergies you have.  All medicines you are taking, including vitamins, herbs, eye drops, creams, and over-the-counter medicines.  Use of steroids (by mouth or creams).  Previous problems you or members of your family have had with the use of anesthetics.  Any blood disorders you have.  Previous surgeries you have had.  Medical conditions you have.  Possibility of pregnancy, if this applies.  Use of cigarettes, alcohol, or illegal drugs. RISKS AND COMPLICATIONS Generally, this is a safe procedure. However, as with any procedure, problems can occur. Possible problems include:  Oversedation.  Trouble breathing on your own. You may need to have a breathing tube until you are awake and breathing on your own.  Allergic reaction to any of the medicines used for the procedure. BEFORE THE PROCEDURE  You may have blood tests done. These tests can help show how well your kidneys and liver are working. They can also show how well your blood clots.  A physical exam will be done.  Only take medicines as directed by your health care provider. You may need to stop taking medicines (such as blood thinners, aspirin, or nonsteroidal anti-inflammatory drugs) before the procedure.   Do not eat or drink at least 6 hours before the procedure or as directed by your health care provider.  Arrange for a responsible adult, family member, or friend to take you home after the procedure. He or she should  stay with you for at least 24 hours after the procedure, until the medicine has worn off. PROCEDURE   An intravenous (IV) catheter will be inserted into one of your veins. Medicine will be able to flow directly into your body through this catheter. You may be given medicine through this tube to help prevent pain and help you relax.  The medical or dental procedure will be done. AFTER THE PROCEDURE  You will stay in a recovery area until the medicine has worn off. Your blood pressure and pulse will be checked.   Depending on the procedure you had, you may be allowed to go home when you can tolerate liquids and your pain is under control. Document Released: 04/08/2001 Document Revised: 07/19/2013 Document Reviewed: 03/21/2013 [facility] Patient Information 2015 [place], [place]. This information is not intended to replace advice given to you by your health care provider. Make sure you discuss any questions you have with your health care provider.      May remove bandage in 24 hours. Call for redness, swelling or drainage. Call for fever or increased pain. Use icepack next 24 hours to help decrease swelling.   Implanted Port Insertion, Care After Refer to this sheet in the next few weeks. These instructions provide you with information on caring for yourself after your procedure. Your health care provider may also give you more specific instructions. Your treatment has been planned according to current medical practices, but problems sometimes occur. Call your health care provider if you have any problems or questions after your procedure. WHAT TO EXPECT AFTER THE PROCEDURE  After your procedure, it is typical to have the following:   Discomfort at the port insertion site. Ice packs to the area will help.  Bruising on the skin over the port. This will subside in 3-4 days. HOME CARE INSTRUCTIONS  After your port is placed, you will get a manufacturer's information card. The card has  information about your port. Keep this card with you at all times.   Know what kind of port you have. There are many types of ports available.   Wear a medical alert bracelet in case of an emergency. This can help alert health care workers that you have a port.   The port can stay in for as long as your health care provider believes it is necessary.   A home health care nurse may give medicines and take care of the port.   You or a family member can get special training and directions for giving medicine and taking care of the port at home.  SEEK MEDICAL CARE IF:   Your port does not flush or you are unable to get a blood return.   You have a fever or chills. SEEK IMMEDIATE MEDICAL CARE IF:  You have new fluid or pus coming from your incision.   You notice a bad smell coming from your incision site.   You have swelling, pain, or more redness at the incision or port site.   You have chest pain or shortness of breath. Document Released: 05/04/2013 Document Revised: 07/19/2013 Document Reviewed: 05/04/2013 [facility] Patient Information 2015 [place], [place]. This information is not intended to replace advice given to you by your health care provider. Make sure you discuss any questions you have with your health care provider.

## 2014-11-28 NOTE — Procedures (Signed)
RIJV PAC SVC RA No comp 
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
## 2014-11-28 NOTE — Progress Notes (Signed)
Call placed to Thorp (nurse) at [facility] where patient resides. Report given post port placement. [facility] transportation here to take patient back to [facility]. Discharged at 1715.

## 2014-12-02 ENCOUNTER — Encounter (HOSPITAL_COMMUNITY): Payer: Self-pay

## 2014-12-02 ENCOUNTER — Emergency Department (HOSPITAL_COMMUNITY)
Admission: EM | Admit: 2014-12-02 | Discharge: 2014-12-03 | Disposition: A | Discharge status: Home / Self Care | Payer: Medicare Other | Source: Home / Self Care | Attending: Emergency Medicine | Admitting: Emergency Medicine

## 2014-12-02 DIAGNOSIS — N390 Urinary tract infection, site not specified: Secondary | ICD-10-CM | POA: Insufficient documentation

## 2014-12-02 DIAGNOSIS — K219 Gastro-esophageal reflux disease without esophagitis: Secondary | ICD-10-CM | POA: Insufficient documentation

## 2014-12-02 DIAGNOSIS — N319 Neuromuscular dysfunction of bladder, unspecified: Secondary | ICD-10-CM | POA: Diagnosis present

## 2014-12-02 DIAGNOSIS — K5900 Constipation, unspecified: Secondary | ICD-10-CM

## 2014-12-02 DIAGNOSIS — E669 Obesity, unspecified: Secondary | ICD-10-CM

## 2014-12-02 DIAGNOSIS — Z79899 Other long term (current) drug therapy: Secondary | ICD-10-CM

## 2014-12-02 DIAGNOSIS — Z8701 Personal history of pneumonia (recurrent): Secondary | ICD-10-CM | POA: Insufficient documentation

## 2014-12-02 DIAGNOSIS — Z89612 Acquired absence of left leg above knee: Secondary | ICD-10-CM

## 2014-12-02 DIAGNOSIS — T8351XA Infection and inflammatory reaction due to indwelling urinary catheter, initial encounter: Secondary | ICD-10-CM | POA: Diagnosis not present

## 2014-12-02 DIAGNOSIS — I10 Essential (primary) hypertension: Secondary | ICD-10-CM | POA: Insufficient documentation

## 2014-12-02 DIAGNOSIS — G4700 Insomnia, unspecified: Secondary | ICD-10-CM | POA: Insufficient documentation

## 2014-12-02 DIAGNOSIS — F319 Bipolar disorder, unspecified: Secondary | ICD-10-CM | POA: Insufficient documentation

## 2014-12-02 DIAGNOSIS — C689 Malignant neoplasm of urinary organ, unspecified: Secondary | ICD-10-CM | POA: Diagnosis present

## 2014-12-02 DIAGNOSIS — Z8551 Personal history of malignant neoplasm of bladder: Secondary | ICD-10-CM

## 2014-12-02 DIAGNOSIS — G8929 Other chronic pain: Secondary | ICD-10-CM

## 2014-12-02 DIAGNOSIS — Z6839 Body mass index (BMI) 39.0-39.9, adult: Secondary | ICD-10-CM

## 2014-12-02 DIAGNOSIS — Z8619 Personal history of other infectious and parasitic diseases: Secondary | ICD-10-CM

## 2014-12-02 DIAGNOSIS — E2740 Unspecified adrenocortical insufficiency: Secondary | ICD-10-CM | POA: Diagnosis present

## 2014-12-02 DIAGNOSIS — Z86711 Personal history of pulmonary embolism: Secondary | ICD-10-CM | POA: Insufficient documentation

## 2014-12-02 DIAGNOSIS — E538 Deficiency of other specified B group vitamins: Secondary | ICD-10-CM | POA: Insufficient documentation

## 2014-12-02 DIAGNOSIS — Z87891 Personal history of nicotine dependence: Secondary | ICD-10-CM

## 2014-12-02 DIAGNOSIS — B961 Klebsiella pneumoniae [K. pneumoniae] as the cause of diseases classified elsewhere: Secondary | ICD-10-CM | POA: Diagnosis present

## 2014-12-02 DIAGNOSIS — Z9049 Acquired absence of other specified parts of digestive tract: Secondary | ICD-10-CM | POA: Diagnosis present

## 2014-12-02 DIAGNOSIS — E785 Hyperlipidemia, unspecified: Secondary | ICD-10-CM

## 2014-12-02 DIAGNOSIS — Z792 Long term (current) use of antibiotics: Secondary | ICD-10-CM

## 2014-12-02 DIAGNOSIS — G8220 Paraplegia, unspecified: Secondary | ICD-10-CM | POA: Diagnosis present

## 2014-12-02 DIAGNOSIS — Z933 Colostomy status: Secondary | ICD-10-CM

## 2014-12-02 DIAGNOSIS — D649 Anemia, unspecified: Secondary | ICD-10-CM | POA: Insufficient documentation

## 2014-12-02 DIAGNOSIS — Y846 Urinary catheterization as the cause of abnormal reaction of the patient, or of later complication, without mention of misadventure at the time of the procedure: Secondary | ICD-10-CM | POA: Diagnosis present

## 2014-12-02 DIAGNOSIS — Z91018 Allergy to other foods: Secondary | ICD-10-CM

## 2014-12-02 DIAGNOSIS — B1920 Unspecified viral hepatitis C without hepatic coma: Secondary | ICD-10-CM | POA: Diagnosis present

## 2014-12-02 DIAGNOSIS — R509 Fever, unspecified: Secondary | ICD-10-CM | POA: Diagnosis not present

## 2014-12-02 DIAGNOSIS — Z7901 Long term (current) use of anticoagulants: Secondary | ICD-10-CM

## 2014-12-02 LAB — URINALYSIS, ROUTINE W REFLEX MICROSCOPIC
Bilirubin Urine: NEGATIVE
Glucose, UA: NEGATIVE mg/dL
KETONES UR: NEGATIVE mg/dL
NITRITE: NEGATIVE
Protein, ur: 100 mg/dL — AB
Specific Gravity, Urine: 1.007 (ref 1.005–1.030)
UROBILINOGEN UA: 0.2 mg/dL (ref 0.0–1.0)
pH: 6.5 (ref 5.0–8.0)

## 2014-12-02 LAB — COMPREHENSIVE METABOLIC PANEL
ALBUMIN: 2.7 g/dL — AB (ref 3.5–5.0)
ALK PHOS: 70 U/L (ref 38–126)
ALT: 9 U/L — ABNORMAL LOW (ref 17–63)
ANION GAP: 5 (ref 5–15)
AST: 15 U/L (ref 15–41)
BUN: 11 mg/dL (ref 6–20)
CALCIUM: 8.2 mg/dL — AB (ref 8.9–10.3)
CO2: 21 mmol/L — ABNORMAL LOW (ref 22–32)
Chloride: 106 mmol/L (ref 101–111)
Creatinine, Ser: 0.82 mg/dL (ref 0.61–1.24)
GFR calc non Af Amer: >60 mL/min (ref >60)
GLUCOSE: 135 mg/dL — AB (ref 70–99)
POTASSIUM: 3.3 mmol/L — AB (ref 3.5–5.1)
Sodium: 132 mmol/L — ABNORMAL LOW (ref 135–145)
Total Bilirubin: 0.2 mg/dL — ABNORMAL LOW (ref 0.3–1.2)
Total Protein: 6 g/dL — ABNORMAL LOW (ref 6.5–8.1)

## 2014-12-02 LAB — CBC WITH DIFFERENTIAL/PLATELET
BASOS PCT: 1 % (ref 0–1)
Basophils Absolute: 0.1 10*3/uL (ref 0.0–0.1)
Eosinophils Absolute: 0.6 10*3/uL (ref 0.0–0.7)
Eosinophils Relative: 6 % — ABNORMAL HIGH (ref 0–5)
HEMATOCRIT: 41.4 % (ref 39.0–52.0)
HEMOGLOBIN: 13.4 g/dL (ref 13.0–17.0)
LYMPHS ABS: 1.3 10*3/uL (ref 0.7–4.0)
Lymphocytes Relative: 14 % (ref 12–46)
MCH: 25.9 pg — AB (ref 26.0–34.0)
MCHC: 32.4 g/dL (ref 30.0–36.0)
MCV: 79.9 fL (ref 78.0–100.0)
MONO ABS: 0.7 10*3/uL (ref 0.1–1.0)
Monocytes Relative: 7 % (ref 3–12)
NEUTROS ABS: 7.1 10*3/uL (ref 1.7–7.7)
NEUTROS PCT: 72 % (ref 43–77)
Platelets: 293 10*3/uL (ref 150–400)
RBC: 5.18 MIL/uL (ref 4.22–5.81)
RDW: 15 % (ref 11.5–15.5)
WBC: 9.7 10*3/uL (ref 4.0–10.5)

## 2014-12-02 LAB — URINE MICROSCOPIC-ADD ON

## 2014-12-02 MED ORDER — SODIUM CHLORIDE 0.9 % IV BOLUS (SEPSIS)
1000.0000 mL | Freq: Once | INTRAVENOUS | Status: AC
Start: 2014-12-02 — End: 2014-12-03
  Administered 2014-12-02: 1000 mL via INTRAVENOUS

## 2014-12-02 MED ORDER — PROMETHAZINE HCL 25 MG/ML IJ SOLN
12.5000 mg | Freq: Once | INTRAMUSCULAR | Status: DC
  Filled 2014-12-02: qty 1

## 2014-12-02 NOTE — ED Notes (Addendum)
PER EMS: pt from [facility]. Hx of recurrent UTI infections and his PCP wants him to be seen here for this. He states his abx arent working like they should and he thinks he is becoming immune to them. He reports his left leg hurts but is AKA; chronic issue for this pain.  A&Ox4. 170/90, HR-110, RR-10, and at the nursing home his temp was 99.8. Has foley catheter in place.
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
## 2014-12-03 MED ORDER — OXYCODONE-ACETAMINOPHEN 5-325 MG PO TABS
2.0000 | ORAL_TABLET | Freq: Once | ORAL | Status: AC
  Administered 2014-12-03: 2 via ORAL
  Filled 2014-12-03: qty 2

## 2014-12-03 MED ORDER — AMOXICILLIN 500 MG PO CAPS: 500.0000 mg | ORAL_CAPSULE | Freq: Three times a day (TID) | ORAL | Status: DC

## 2014-12-03 MED ORDER — AMOXICILLIN 500 MG PO CAPS
500.0000 mg | ORAL_CAPSULE | Freq: Once | ORAL | Status: AC
  Administered 2014-12-03: 500 mg via ORAL
  Filled 2014-12-03: qty 1

## 2014-12-03 NOTE — ED Notes (Signed)
Pt. Left with all belongings 

## 2014-12-03 NOTE — Discharge Instructions (Signed)

## 2014-12-05 ENCOUNTER — Non-Acute Institutional Stay (SKILLED_NURSING_FACILITY): Payer: Medicare Other | Admitting: Internal Medicine

## 2014-12-05 ENCOUNTER — Inpatient Hospital Stay (HOSPITAL_COMMUNITY)
Admission: EM | Admit: 2014-12-05 | Discharge: 2014-12-08 | DRG: 699 | Disposition: A | Discharge status: Skilled Nursing Facility | Payer: Medicare Other | Attending: Internal Medicine | Admitting: Internal Medicine

## 2014-12-05 ENCOUNTER — Encounter (HOSPITAL_COMMUNITY): Payer: Self-pay | Admitting: Family Medicine

## 2014-12-05 DIAGNOSIS — C688 Malignant neoplasm of overlapping sites of urinary organs: Secondary | ICD-10-CM | POA: Diagnosis not present

## 2014-12-05 DIAGNOSIS — T83511A Infection and inflammatory reaction due to indwelling urethral catheter, initial encounter: Secondary | ICD-10-CM

## 2014-12-05 DIAGNOSIS — N390 Urinary tract infection, site not specified: Secondary | ICD-10-CM

## 2014-12-05 DIAGNOSIS — F319 Bipolar disorder, unspecified: Secondary | ICD-10-CM | POA: Diagnosis present

## 2014-12-05 DIAGNOSIS — T8351XA Infection and inflammatory reaction due to indwelling urinary catheter, initial encounter: Secondary | ICD-10-CM

## 2014-12-05 DIAGNOSIS — C689 Malignant neoplasm of urinary organ, unspecified: Secondary | ICD-10-CM

## 2014-12-05 DIAGNOSIS — Z452 Encounter for adjustment and management of vascular access device: Secondary | ICD-10-CM

## 2014-12-05 DIAGNOSIS — I10 Essential (primary) hypertension: Secondary | ICD-10-CM | POA: Diagnosis present

## 2014-12-05 LAB — URINALYSIS, ROUTINE W REFLEX MICROSCOPIC
Bilirubin Urine: NEGATIVE
Glucose, UA: NEGATIVE mg/dL
Ketones, ur: NEGATIVE mg/dL
Nitrite: NEGATIVE
Protein, ur: 100 mg/dL — AB
Specific Gravity, Urine: 1.01 (ref 1.005–1.030)
Urobilinogen, UA: 0.2 mg/dL (ref 0.0–1.0)
pH: 6 (ref 5.0–8.0)

## 2014-12-05 LAB — CBC WITH DIFFERENTIAL/PLATELET
Basophils Absolute: 0.1 10*3/uL (ref 0.0–0.1)
Basophils Relative: 1 % (ref 0–1)
Eosinophils Absolute: 0.8 10*3/uL — ABNORMAL HIGH (ref 0.0–0.7)
Eosinophils Relative: 6 % — ABNORMAL HIGH (ref 0–5)
HCT: 30.4 % — ABNORMAL LOW (ref 39.0–52.0)
Hemoglobin: 9.7 g/dL — ABNORMAL LOW (ref 13.0–17.0)
Lymphocytes Relative: 13 % (ref 12–46)
Lymphs Abs: 1.6 10*3/uL (ref 0.7–4.0)
MCH: 25.7 pg — ABNORMAL LOW (ref 26.0–34.0)
MCHC: 31.9 g/dL (ref 30.0–36.0)
MCV: 80.4 fL (ref 78.0–100.0)
Monocytes Absolute: 0.8 10*3/uL (ref 0.1–1.0)
Monocytes Relative: 6 % (ref 3–12)
Neutro Abs: 9.3 10*3/uL — ABNORMAL HIGH (ref 1.7–7.7)
Neutrophils Relative %: 74 % (ref 43–77)
Platelets: 377 10*3/uL (ref 150–400)
RBC: 3.78 MIL/uL — ABNORMAL LOW (ref 4.22–5.81)
RDW: 15 % (ref 11.5–15.5)
WBC: 12.6 10*3/uL — ABNORMAL HIGH (ref 4.0–10.5)

## 2014-12-05 LAB — URINE MICROSCOPIC-ADD ON

## 2014-12-05 LAB — BASIC METABOLIC PANEL
Anion gap: 10 (ref 5–15)
BUN: 18 mg/dL (ref 6–20)
CO2: 22 mmol/L (ref 22–32)
Calcium: 8.7 mg/dL — ABNORMAL LOW (ref 8.9–10.3)
Chloride: 105 mmol/L (ref 101–111)
Creatinine, Ser: 0.99 mg/dL (ref 0.61–1.24)
GFR calc Af Amer: >60 mL/min (ref >60)
GFR calc non Af Amer: >60 mL/min (ref >60)
Glucose, Bld: 90 mg/dL (ref 70–99)
Potassium: 4 mmol/L (ref 3.5–5.1)
Sodium: 137 mmol/L (ref 135–145)

## 2014-12-05 MED ORDER — DEXTROSE 5 % IV SOLN
1.0000 g | INTRAVENOUS | Status: DC
  Administered 2014-12-05: 1 g via INTRAVENOUS
  Filled 2014-12-05: qty 10

## 2014-12-05 NOTE — ED Notes (Signed)
Bed: WA03 Expected date:  Expected time:  Means of arrival:  Comments: EMS from SNF UTI/fever

## 2014-12-05 NOTE — ED Notes (Signed)
Patient is from [facility]. Per EMS, patient has been experiencing UTI symptoms for the week. For the last 3 days (9 doses), he has took antibiotics but states they are not improving. Pt took his own temp and claims it was 103 oral. Took 2 Ibuprofen tablets 3 hrs ago. Pt had left leg pain that was relieved from the Ibuprofen.
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
## 2014-12-06 ENCOUNTER — Other Ambulatory Visit: Payer: Self-pay

## 2014-12-06 ENCOUNTER — Ambulatory Visit: Payer: Self-pay

## 2014-12-06 ENCOUNTER — Encounter (HOSPITAL_COMMUNITY): Payer: Self-pay | Admitting: Internal Medicine

## 2014-12-06 DIAGNOSIS — C689 Malignant neoplasm of urinary organ, unspecified: Secondary | ICD-10-CM | POA: Diagnosis present

## 2014-12-06 DIAGNOSIS — Z86711 Personal history of pulmonary embolism: Secondary | ICD-10-CM | POA: Diagnosis not present

## 2014-12-06 DIAGNOSIS — N390 Urinary tract infection, site not specified: Secondary | ICD-10-CM | POA: Diagnosis present

## 2014-12-06 DIAGNOSIS — K219 Gastro-esophageal reflux disease without esophagitis: Secondary | ICD-10-CM | POA: Diagnosis present

## 2014-12-06 DIAGNOSIS — Z87891 Personal history of nicotine dependence: Secondary | ICD-10-CM | POA: Diagnosis not present

## 2014-12-06 DIAGNOSIS — Z8551 Personal history of malignant neoplasm of bladder: Secondary | ICD-10-CM | POA: Diagnosis not present

## 2014-12-06 DIAGNOSIS — R509 Fever, unspecified: Secondary | ICD-10-CM | POA: Diagnosis present

## 2014-12-06 DIAGNOSIS — B1920 Unspecified viral hepatitis C without hepatic coma: Secondary | ICD-10-CM | POA: Diagnosis present

## 2014-12-06 DIAGNOSIS — Z6839 Body mass index (BMI) 39.0-39.9, adult: Secondary | ICD-10-CM | POA: Diagnosis not present

## 2014-12-06 DIAGNOSIS — F319 Bipolar disorder, unspecified: Secondary | ICD-10-CM | POA: Diagnosis present

## 2014-12-06 DIAGNOSIS — C688 Malignant neoplasm of overlapping sites of urinary organs: Secondary | ICD-10-CM | POA: Diagnosis not present

## 2014-12-06 DIAGNOSIS — Z91018 Allergy to other foods: Secondary | ICD-10-CM | POA: Diagnosis not present

## 2014-12-06 DIAGNOSIS — E669 Obesity, unspecified: Secondary | ICD-10-CM | POA: Diagnosis present

## 2014-12-06 DIAGNOSIS — Z7901 Long term (current) use of anticoagulants: Secondary | ICD-10-CM | POA: Diagnosis not present

## 2014-12-06 DIAGNOSIS — Z9049 Acquired absence of other specified parts of digestive tract: Secondary | ICD-10-CM | POA: Diagnosis present

## 2014-12-06 DIAGNOSIS — N319 Neuromuscular dysfunction of bladder, unspecified: Secondary | ICD-10-CM | POA: Diagnosis present

## 2014-12-06 DIAGNOSIS — I10 Essential (primary) hypertension: Secondary | ICD-10-CM | POA: Diagnosis present

## 2014-12-06 DIAGNOSIS — E2740 Unspecified adrenocortical insufficiency: Secondary | ICD-10-CM | POA: Diagnosis present

## 2014-12-06 DIAGNOSIS — Z933 Colostomy status: Secondary | ICD-10-CM | POA: Diagnosis not present

## 2014-12-06 DIAGNOSIS — Z89612 Acquired absence of left leg above knee: Secondary | ICD-10-CM | POA: Diagnosis not present

## 2014-12-06 DIAGNOSIS — Y846 Urinary catheterization as the cause of abnormal reaction of the patient, or of later complication, without mention of misadventure at the time of the procedure: Secondary | ICD-10-CM | POA: Diagnosis present

## 2014-12-06 DIAGNOSIS — B961 Klebsiella pneumoniae [K. pneumoniae] as the cause of diseases classified elsewhere: Secondary | ICD-10-CM | POA: Diagnosis present

## 2014-12-06 DIAGNOSIS — Z792 Long term (current) use of antibiotics: Secondary | ICD-10-CM | POA: Diagnosis not present

## 2014-12-06 DIAGNOSIS — F3110 Bipolar disorder, current episode manic without psychotic features, unspecified: Secondary | ICD-10-CM | POA: Diagnosis not present

## 2014-12-06 DIAGNOSIS — E785 Hyperlipidemia, unspecified: Secondary | ICD-10-CM | POA: Diagnosis present

## 2014-12-06 DIAGNOSIS — T8351XA Infection and inflammatory reaction due to indwelling urinary catheter, initial encounter: Secondary | ICD-10-CM | POA: Diagnosis present

## 2014-12-06 DIAGNOSIS — Z8701 Personal history of pneumonia (recurrent): Secondary | ICD-10-CM | POA: Diagnosis not present

## 2014-12-06 DIAGNOSIS — G4700 Insomnia, unspecified: Secondary | ICD-10-CM | POA: Diagnosis present

## 2014-12-06 DIAGNOSIS — G8929 Other chronic pain: Secondary | ICD-10-CM | POA: Diagnosis present

## 2014-12-06 DIAGNOSIS — G8220 Paraplegia, unspecified: Secondary | ICD-10-CM | POA: Diagnosis present

## 2014-12-06 LAB — COMPREHENSIVE METABOLIC PANEL
ALT: 9 U/L — ABNORMAL LOW (ref 17–63)
AST: 18 U/L (ref 15–41)
Albumin: 3 g/dL — ABNORMAL LOW (ref 3.5–5.0)
Alkaline Phosphatase: 66 U/L (ref 38–126)
Anion gap: 11 (ref 5–15)
BUN: 15 mg/dL (ref 6–20)
CALCIUM: 8.7 mg/dL — AB (ref 8.9–10.3)
CHLORIDE: 103 mmol/L (ref 101–111)
CO2: 24 mmol/L (ref 22–32)
CREATININE: 0.84 mg/dL (ref 0.61–1.24)
GLUCOSE: 114 mg/dL — AB (ref 70–99)
Potassium: 3.9 mmol/L (ref 3.5–5.1)
Sodium: 138 mmol/L (ref 135–145)
Total Bilirubin: 0.2 mg/dL — ABNORMAL LOW (ref 0.3–1.2)
Total Protein: 6.7 g/dL (ref 6.5–8.1)

## 2014-12-06 LAB — CBC WITH DIFFERENTIAL/PLATELET
Basophils Absolute: 0.1 10*3/uL (ref 0.0–0.1)
Basophils Relative: 1 % (ref 0–1)
EOS ABS: 0.7 10*3/uL (ref 0.0–0.7)
EOS PCT: 6 % — AB (ref 0–5)
HCT: 31.3 % — ABNORMAL LOW (ref 39.0–52.0)
Hemoglobin: 9.9 g/dL — ABNORMAL LOW (ref 13.0–17.0)
Lymphocytes Relative: 10 % — ABNORMAL LOW (ref 12–46)
Lymphs Abs: 1 10*3/uL (ref 0.7–4.0)
MCH: 25.4 pg — AB (ref 26.0–34.0)
MCHC: 31.6 g/dL (ref 30.0–36.0)
MCV: 80.5 fL (ref 78.0–100.0)
MONO ABS: 0.9 10*3/uL (ref 0.1–1.0)
MONOS PCT: 9 % (ref 3–12)
NEUTROS PCT: 74 % (ref 43–77)
Neutro Abs: 7.7 10*3/uL (ref 1.7–7.7)
PLATELETS: 378 10*3/uL (ref 150–400)
RBC: 3.89 MIL/uL — ABNORMAL LOW (ref 4.22–5.81)
RDW: 15.1 % (ref 11.5–15.5)
WBC: 10.3 10*3/uL (ref 4.0–10.5)

## 2014-12-06 LAB — URINE CULTURE: Colony Count: >=100000

## 2014-12-06 LAB — PROTIME-INR
INR: 1.71 — AB (ref 0.00–1.49)
Prothrombin Time: 20.2 seconds — ABNORMAL HIGH (ref 11.6–15.2)

## 2014-12-06 MED ORDER — ACETAMINOPHEN 650 MG RE SUPP: 650.0000 mg | Freq: Four times a day (QID) | RECTAL | Status: DC | PRN

## 2014-12-06 MED ORDER — OXYBUTYNIN CHLORIDE ER 5 MG PO TB24
5.0000 mg | ORAL_TABLET | Freq: Every day | ORAL | Status: DC
  Administered 2014-12-06 – 2014-12-08 (×3): 5 mg via ORAL
  Filled 2014-12-06 (×3): qty 1

## 2014-12-06 MED ORDER — ONDANSETRON HCL 4 MG/2ML IJ SOLN: 4.0000 mg | Freq: Four times a day (QID) | INTRAMUSCULAR | Status: DC | PRN

## 2014-12-06 MED ORDER — CALCIUM CARBONATE 1250 (500 CA) MG PO TABS
1.0000 | ORAL_TABLET | Freq: Every day | ORAL | Status: DC
  Administered 2014-12-06 – 2014-12-08 (×3): 500 mg via ORAL
  Filled 2014-12-06 (×5): qty 1

## 2014-12-06 MED ORDER — MORPHINE SULFATE 2 MG/ML IJ SOLN
1.0000 mg | INTRAMUSCULAR | Status: DC | PRN
  Administered 2014-12-06 (×2): 1 mg via INTRAVENOUS
  Filled 2014-12-06 (×2): qty 1

## 2014-12-06 MED ORDER — METOPROLOL TARTRATE 25 MG PO TABS
25.0000 mg | ORAL_TABLET | Freq: Two times a day (BID) | ORAL | Status: DC
  Administered 2014-12-06 – 2014-12-08 (×6): 25 mg via ORAL
  Filled 2014-12-06 (×7): qty 1

## 2014-12-06 MED ORDER — IPRATROPIUM-ALBUTEROL 0.5-2.5 (3) MG/3ML IN SOLN: 3.0000 mL | Freq: Four times a day (QID) | RESPIRATORY_TRACT | Status: DC

## 2014-12-06 MED ORDER — QUETIAPINE FUMARATE 200 MG PO TABS
200.0000 mg | ORAL_TABLET | Freq: Two times a day (BID) | ORAL | Status: DC
  Administered 2014-12-06 – 2014-12-08 (×6): 200 mg via ORAL
  Filled 2014-12-06 (×7): qty 1

## 2014-12-06 MED ORDER — METOCLOPRAMIDE HCL 5 MG PO TABS
5.0000 mg | ORAL_TABLET | Freq: Three times a day (TID) | ORAL | Status: DC
  Administered 2014-12-06 – 2014-12-08 (×10): 5 mg via ORAL
  Filled 2014-12-06 (×13): qty 1

## 2014-12-06 MED ORDER — DOCUSATE SODIUM 100 MG PO CAPS
100.0000 mg | ORAL_CAPSULE | Freq: Two times a day (BID) | ORAL | Status: DC
Start: 2014-12-06 — End: 2014-12-08
  Administered 2014-12-06 – 2014-12-08 (×6): 100 mg via ORAL
  Filled 2014-12-06 (×8): qty 1

## 2014-12-06 MED ORDER — IPRATROPIUM-ALBUTEROL 0.5-2.5 (3) MG/3ML IN SOLN
3.0000 mL | Freq: Four times a day (QID) | RESPIRATORY_TRACT | Status: DC
  Administered 2014-12-06: 3 mL via RESPIRATORY_TRACT
  Filled 2014-12-06: qty 3

## 2014-12-06 MED ORDER — FOLIC ACID 1 MG PO TABS
1.0000 mg | ORAL_TABLET | Freq: Every day | ORAL | Status: DC
  Administered 2014-12-06 – 2014-12-08 (×3): 1 mg via ORAL
  Filled 2014-12-06 (×3): qty 1

## 2014-12-06 MED ORDER — ACETAMINOPHEN 325 MG PO TABS
650.0000 mg | ORAL_TABLET | Freq: Four times a day (QID) | ORAL | Status: DC | PRN
  Administered 2014-12-06 – 2014-12-07 (×2): 650 mg via ORAL
  Filled 2014-12-06 (×2): qty 2

## 2014-12-06 MED ORDER — ATORVASTATIN CALCIUM 10 MG PO TABS
10.0000 mg | ORAL_TABLET | Freq: Every day | ORAL | Status: DC
  Administered 2014-12-06 – 2014-12-07 (×3): 10 mg via ORAL
  Filled 2014-12-06 (×5): qty 1

## 2014-12-06 MED ORDER — IPRATROPIUM-ALBUTEROL 0.5-2.5 (3) MG/3ML IN SOLN: 3.0000 mL | RESPIRATORY_TRACT | Status: DC | PRN

## 2014-12-06 MED ORDER — LAMOTRIGINE 200 MG PO TABS
200.0000 mg | ORAL_TABLET | Freq: Two times a day (BID) | ORAL | Status: DC
  Administered 2014-12-06 – 2014-12-08 (×5): 200 mg via ORAL
  Filled 2014-12-06 (×8): qty 1

## 2014-12-06 MED ORDER — CALCIUM CARBONATE ANTACID 500 MG PO CHEW
4.0000 | CHEWABLE_TABLET | ORAL | Status: DC | PRN
  Administered 2014-12-06 – 2014-12-07 (×2): 800 mg via ORAL
  Filled 2014-12-06 (×2): qty 4

## 2014-12-06 MED ORDER — POTASSIUM CHLORIDE CRYS ER 20 MEQ PO TBCR
20.0000 meq | EXTENDED_RELEASE_TABLET | Freq: Two times a day (BID) | ORAL | Status: DC
  Administered 2014-12-06 – 2014-12-08 (×6): 20 meq via ORAL
  Filled 2014-12-06 (×7): qty 1

## 2014-12-06 MED ORDER — LIDOCAINE 5 % EX PTCH
1.0000 | MEDICATED_PATCH | CUTANEOUS | Status: DC
  Administered 2014-12-06 – 2014-12-08 (×3): 1 via TRANSDERMAL
  Filled 2014-12-06 (×3): qty 1

## 2014-12-06 MED ORDER — SENNA 8.6 MG PO TABS
2.0000 | ORAL_TABLET | Freq: Every day | ORAL | Status: DC
  Administered 2014-12-06 – 2014-12-08 (×3): 17.2 mg via ORAL
  Filled 2014-12-06 (×3): qty 2

## 2014-12-06 MED ORDER — PANTOPRAZOLE SODIUM 40 MG PO TBEC
40.0000 mg | DELAYED_RELEASE_TABLET | Freq: Every day | ORAL | Status: DC
  Administered 2014-12-06 – 2014-12-08 (×3): 40 mg via ORAL
  Filled 2014-12-06 (×3): qty 1

## 2014-12-06 MED ORDER — GUAIFENESIN 100 MG/5ML PO SOLN
300.0000 mg | Freq: Three times a day (TID) | ORAL | Status: DC | PRN
  Filled 2014-12-06: qty 15

## 2014-12-06 MED ORDER — WARFARIN - PHARMACIST DOSING INPATIENT: Freq: Every day | Status: DC

## 2014-12-06 MED ORDER — AMITRIPTYLINE HCL 25 MG PO TABS
75.0000 mg | ORAL_TABLET | Freq: Every day | ORAL | Status: DC
  Administered 2014-12-07: 75 mg via ORAL
  Filled 2014-12-06 (×2): qty 3

## 2014-12-06 MED ORDER — SODIUM CHLORIDE 0.9 % IV SOLN
500.0000 mg | Freq: Four times a day (QID) | INTRAVENOUS | Status: DC
  Administered 2014-12-06 – 2014-12-08 (×8): 500 mg via INTRAVENOUS
  Filled 2014-12-06 (×11): qty 500

## 2014-12-06 MED ORDER — SODIUM CHLORIDE 0.9 % IV SOLN
INTRAVENOUS | Status: DC
  Administered 2014-12-06 (×2): via INTRAVENOUS

## 2014-12-06 MED ORDER — FAMOTIDINE 20 MG PO TABS
20.0000 mg | ORAL_TABLET | Freq: Every day | ORAL | Status: DC
  Administered 2014-12-06 – 2014-12-07 (×3): 20 mg via ORAL
  Filled 2014-12-06 (×4): qty 1

## 2014-12-06 MED ORDER — VITAMIN D (ERGOCALCIFEROL) 1.25 MG (50000 UNIT) PO CAPS
50000.0000 [IU] | ORAL_CAPSULE | ORAL | Status: DC
  Administered 2014-12-06: 50000 [IU] via ORAL
  Filled 2014-12-06: qty 1

## 2014-12-06 MED ORDER — MORPHINE SULFATE 2 MG/ML IJ SOLN
2.0000 mg | INTRAMUSCULAR | Status: DC | PRN
  Administered 2014-12-06 – 2014-12-08 (×10): 2 mg via INTRAVENOUS
  Filled 2014-12-06 (×10): qty 1

## 2014-12-06 MED ORDER — ONDANSETRON HCL 4 MG PO TABS: 4.0000 mg | ORAL_TABLET | Freq: Three times a day (TID) | ORAL | Status: DC | PRN

## 2014-12-06 MED ORDER — AMITRIPTYLINE HCL 50 MG PO TABS
50.0000 mg | ORAL_TABLET | Freq: Every day | ORAL | Status: AC
  Administered 2014-12-06 (×2): 50 mg via ORAL
  Filled 2014-12-06 (×2): qty 1

## 2014-12-06 MED ORDER — ONDANSETRON HCL 4 MG PO TABS
4.0000 mg | ORAL_TABLET | Freq: Four times a day (QID) | ORAL | Status: DC | PRN
  Administered 2014-12-06 (×2): 4 mg via ORAL
  Filled 2014-12-06 (×2): qty 1

## 2014-12-06 MED ORDER — WARFARIN SODIUM 5 MG PO TABS
5.0000 mg | ORAL_TABLET | Freq: Once | ORAL | Status: AC
  Administered 2014-12-06: 5 mg via ORAL
  Filled 2014-12-06: qty 1

## 2014-12-06 MED ORDER — FERROUS SULFATE 325 (65 FE) MG PO TABS
325.0000 mg | ORAL_TABLET | Freq: Two times a day (BID) | ORAL | Status: DC
Start: 2014-12-06 — End: 2014-12-08
  Administered 2014-12-06 – 2014-12-08 (×5): 325 mg via ORAL
  Filled 2014-12-06 (×7): qty 1

## 2014-12-06 MED ORDER — PREGABALIN 75 MG PO CAPS
75.0000 mg | ORAL_CAPSULE | Freq: Three times a day (TID) | ORAL | Status: DC
  Administered 2014-12-06 – 2014-12-08 (×7): 75 mg via ORAL
  Filled 2014-12-06 (×7): qty 1

## 2014-12-06 MED ORDER — NITROGLYCERIN 0.4 MG SL SUBL: 0.4000 mg | SUBLINGUAL_TABLET | SUBLINGUAL | Status: DC | PRN

## 2014-12-06 MED ORDER — PROMETHAZINE HCL 25 MG PO TABS: 12.5000 mg | ORAL_TABLET | Freq: Four times a day (QID) | ORAL | Status: DC | PRN

## 2014-12-06 MED ORDER — OXYCODONE HCL 5 MG PO TABS
5.0000 mg | ORAL_TABLET | ORAL | Status: DC | PRN
  Administered 2014-12-06 – 2014-12-08 (×10): 5 mg via ORAL
  Filled 2014-12-06 (×10): qty 1

## 2014-12-06 MED ORDER — VITAMIN B-12 1000 MCG PO TABS
1000.0000 ug | ORAL_TABLET | ORAL | Status: DC
  Administered 2014-12-06 – 2014-12-08 (×2): 1000 ug via ORAL
  Filled 2014-12-06 (×2): qty 1

## 2014-12-06 NOTE — Clinical Social Work Note (Signed)
Clinical Social Work Assessment  Patient Details  Name: Samuel [name] MRN: [id number] Date of Birth: Nov 11, 1955  Date of referral:  12/06/14               Reason for consult:  Discharge Planning                Permission sought to share information with:  Facility Sport and exercise psychologist, Family Supports Permission granted to share information::  Yes, Verbal Permission Granted  Name::     Samuel [name] (CSW did not contact pt daughter at this time as pt alert and oriented x 4 and states that he had already updated pt daughter, but pt is agreeable to communication with pt daughter as needed)  Agency::  [facility]  Relationship::  daughter  Contact Information:  [phone]  Housing/Transportation Living arrangements for the past 2 months:  [place] of Information:  Patient Patient Interpreter Needed:  None Criminal Activity/Legal Involvement Pertinent to Current Situation/Hospitalization:  No - Comment as needed Significant Relationships:  Adult Children Lives with:  Facility Resident Do you feel safe going back to the place where you live?  Yes Need for family participation in patient care:  No (Coment) (pt alert and oriented x 4 and able to make own decision, CSW to involve family per pt request)  Care giving concerns: Pt admitted from [facility] [place] where pt is a long term resident   Facilities manager / plan: CSW received referral that pt admitted from [facility] [place].   CSW met with pt at bedside. CSW introduced self and explained role. Pt confirmed that he is a resident at [facility]. Pt shared that he is a long term resident at facility. Pt confirmed plans to return to [place] upon discharge. Pt expressed that he is anxious about plan for chemotherapy as pt reports that he was supposed to start chemotherapy today, but now admitted to the hospital. CSW discussed with pt that per MD  note, pt oncologist was consulted in order to see pt while in the hospital. Pt expressed understanding and hopeful to get further clarification regarding plan once able to speak to oncologist and urologist.   CSW completed FL2 and send updated clinicals to [facility]. CSW spoke with [facility] and confirmed that pt can return when medically stable for discharge.   CSW to continue to follow to provide support and assist with pt return to [facility] when medically stable for discharge.  Employment status:  Disabled (Comment on whether or not currently receiving Disability) Insurance information:  Programmer, applications, Medicaid In [place] PT Recommendations:  Not assessed at this time (pt is a long term resident at [place]) Information / Referral to community resources:  [place] (Referral back to [facility])  Patient/Family's Response to care:  Pt alert and oriented x 4. CSW satisfied with pt care at [facility] and plans to return upon discharge. Pt reports that he has updated his daughter, Samuel [name] regarding admission to the hospital.   Patient/Family's Understanding of and Emotional Response to Diagnosis, Current Treatment, and Prognosis:  Pt knowledgeable about current diagnosis and treatment plan. Pt shared that he anticipated that he had an infection when he had a fever and expressed that he knows his body well and can tell when something is abnormal. Pt eager to speak with oncologist/urologist regarding plan for chemotherapy since admission to hospital interfered  with planned appointments.   Emotional Assessment Appearance:  Appears stated age Attitude/Demeanor/Rapport:  Other (pt appropriate attitude/demeanor) Affect (typically observed):  Pleasant, Appropriate Orientation:  Oriented to Self, Oriented to Place, Oriented to  Time, Oriented to Situation Alcohol / Substance use:  Not  Applicable Psych involvement (Current and /or in the community):  No (Comment)  Discharge Needs  Concerns to be addressed:  Discharge Planning Concerns Readmission within the last 30 days:  No Current discharge risk:  Dependent with Mobility Barriers to Discharge:  Continued Medical Work up   Alison Murray A, LCSW 12/06/2014, 4:54 PM  [phone]

## 2014-12-06 NOTE — Progress Notes (Signed)
59 year old male with h/o paraplegia on chronic indwelling foley catheter admitted for fever and chills. He was recently discharged on amoxicillin for UTI, and his urine cultures grew klebsiella UTi sensitive to primaxin. He was started on primaxin earlier this am.  Pain control.  He was admitted earlier today and please see Dr Moise Boring note in detail .  Hosie Poisson, MD [phone]
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
## 2014-12-06 NOTE — ED Notes (Signed)
Report given to 3W.  
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
## 2014-12-06 NOTE — ED Notes (Signed)
Attempted to call report to 3W 

## 2014-12-06 NOTE — Progress Notes (Signed)
Respiratory protocol assessment done, indicating a score of 5.  Nebulizer treatments changed to duoneb qid per home regimen.

## 2014-12-06 NOTE — Care Management Note (Signed)
Case Management Note  Patient Details  Name: Javarion Douty MRN: [id number] Date of Birth: September 11, 1955  Subjective/Objective:        59 yo admitted with UTI. Hx of paraplegia             Action/Plan: From [facility]  Expected Discharge Date:                  Expected Discharge Plan:  Skilled Nursing Facility  In-House Referral:  Clinical Social Work  Discharge planning Services  CM Consult  Post Acute Care Choice:    Choice offered to:     DME Arranged:    DME Agency:     HH Arranged:    [place] Agency:     Status of Service:  In process, will continue to follow  Medicare Important Message Given:    Date Medicare IM Given:    Medicare IM give by:    Date Additional Medicare IM Given:    Additional Medicare Important Message give by:     If discussed at [place] of Stay Meetings, dates discussed:    Additional Comments: Chart reviewed and CM following for DC needs.  Lynnell Catalan, RN 12/06/2014, 10:47 AM

## 2014-12-06 NOTE — Progress Notes (Signed)
Chaplain visited patient and listened as patient talked about family. Patient talked about having lived in [place] and how involved he was with church. He has not found a church in [place] as of yet. Patient enjoyed the support that he once had. Chaplain offered a ministry of presence to patient. Chaplain will follow up.   12/06/14 1600  Clinical Encounter Type  Visited With Patient  Visit Type Initial;Spiritual support;Social support  Referral From [employer]
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
## 2014-12-06 NOTE — ED Notes (Signed)
Pt resting quietly with eyes closed when entering the room. Pt aroused when taking vital signs.

## 2014-12-07 ENCOUNTER — Inpatient Hospital Stay (HOSPITAL_COMMUNITY): Payer: Medicare Other

## 2014-12-07 DIAGNOSIS — C688 Malignant neoplasm of overlapping sites of urinary organs: Secondary | ICD-10-CM

## 2014-12-07 DIAGNOSIS — F3110 Bipolar disorder, current episode manic without psychotic features, unspecified: Secondary | ICD-10-CM

## 2014-12-07 LAB — PROTIME-INR
INR: 2.32 — ABNORMAL HIGH (ref 0.00–1.49)
Prothrombin Time: 25.7 seconds — ABNORMAL HIGH (ref 11.6–15.2)

## 2014-12-07 MED ORDER — IOHEXOL 300 MG/ML  SOLN
50.0000 mL | Freq: Once | INTRAMUSCULAR | Status: AC | PRN
  Administered 2014-12-07: 50 mL via INTRAVENOUS

## 2014-12-07 MED ORDER — WARFARIN SODIUM 5 MG PO TABS
5.0000 mg | ORAL_TABLET | Freq: Once | ORAL | Status: AC
  Administered 2014-12-07: 5 mg via ORAL
  Filled 2014-12-07: qty 1
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
## 2014-12-07 NOTE — Procedures (Signed)
RIJV PAC accessed without difficulty Aspirates and flushes well Patent without extravasation No comp

## 2014-12-08 DIAGNOSIS — I10 Essential (primary) hypertension: Secondary | ICD-10-CM

## 2014-12-08 LAB — URINE CULTURE

## 2014-12-08 LAB — PROTIME-INR
INR: 2.14 — ABNORMAL HIGH (ref 0.00–1.49)
PROTHROMBIN TIME: 24.1 s — AB (ref 11.6–15.2)

## 2014-12-08 MED ORDER — IMIPENEM-CILASTATIN 500 MG IV SOLR
500.0000 mg | Freq: Four times a day (QID) | INTRAVENOUS | Status: AC
Start: 2014-12-08 — End: 2014-12-14

## 2014-12-08 MED ORDER — CLONAZEPAM 0.5 MG PO TABS: ORAL_TABLET | ORAL | Status: DC

## 2014-12-08 MED ORDER — HEPARIN SOD (PORK) LOCK FLUSH 100 UNIT/ML IV SOLN
500.0000 [IU] | INTRAVENOUS | Status: DC
  Administered 2014-12-08: 500 [IU]

## 2014-12-08 MED ORDER — WARFARIN SODIUM 7.5 MG PO TABS
7.5000 mg | ORAL_TABLET | Freq: Once | ORAL | Status: AC
  Administered 2014-12-08: 7.5 mg via ORAL
  Filled 2014-12-08: qty 1

## 2014-12-08 MED ORDER — HEPARIN SOD (PORK) LOCK FLUSH 100 UNIT/ML IV SOLN
500.0000 [IU] | INTRAVENOUS | Status: DC | PRN
Start: 2014-12-08 — End: 2014-12-08
  Filled 2014-12-08: qty 5

## 2014-12-08 MED ORDER — OXYCODONE HCL 5 MG PO TABS: 5.0000 mg | ORAL_TABLET | ORAL | Status: DC | PRN
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
## 2014-12-08 NOTE — Clinical Documentation Improvement (Signed)
Pt admitted with indwelling foley catheter due to neurogenic bladder and repeated Urinary tract infections.     A cause and effect relationship may not be assumed and must be documented by a provider. Please clarify the relationship, if any, between Urinary tract infection and chronic indwelling Foley cathether.  Are the conditions: ? Due to or associated with each other ? Other (please specify) ___________________ ? Unrelated to each other ? Unable to determine ? Unknown     Thank you, Margretta Sidle ,RN Clinical Documentation Specialist:    [facility] [phone] Cell - [phone]

## 2014-12-08 NOTE — Progress Notes (Signed)
Pt for discharge to [facility].  CSW confirmed with [facility] that facility could provide IV antibiotics through pt port as long as pt port is accessed. CSW confirmed with RN that pt port is accessed.  CSW facilitated pt discharge needs including contacting facility, faxing pt discharge information via [facility], discussing with pt at bedside, providing RN phone number to call report, and arranging ambulance transportation for pt back to [facility].  Pt expressed that he notified pt daughter of return to SNF today. Pt expressed wanting to have a bath prior to ambulance arriving. CSW notified RN and arranged transport for 2 pm in order for pt to get bath.   No further social work needs identified at this time.  CSW signing off.   Alison Murray, MSW, [place] [facility] [phone]

## 2014-12-11 NOTE — Progress Notes (Addendum)
Patient ID: Samuel [name], male   DOB: 1955/09/22, 59 y.o.   MRN: [id number]                PROGRESS NOTE  DATE:  12/05/2014         FACILITY: [facility]                       LEVEL OF CARE:   SNF   Acute Visit                  CHIEF COMPLAINT:  Review of trip to the emergency room.      HISTORY OF PRESENT ILLNESS:  Samuel [name] was found to have a temperature up to 101 last week.   He ended up in the ER on 12/02/2014.  There is an order in the facility chart from 11/30/2014 for a urine C&S due to increased confusion and temperature.  In any case, I do not see that result.    LABORATORY DATA:  In the emergency room, his sodium was 132, potassium 3.3, CO2 of 21, creatinine 0.82.    Albumin was 2.7.    White count was 9.7, hemoglobin 13.4, platelet count of 293.  Differential count:   72% neutrophils, 6% eosinophils.    Urine culture grew greater than 100,000 gram-negative rods, although there is no identity and sensitivity as of yet.    REVIEW OF SYSTEMS:    GENERAL:  The patient states his fever is gone.  He was put on amoxicillin, apparently.   CHEST/RESPIRATORY:  No cough.  No sputum.     CARDIAC:  No chest pain.     GI:  No abdominal pain.  No diarrhea.   MUSCULOSKELETAL:  He complains of stump pain.  I note that he was recently put on lidocaine patch to this area, which is probably not a bad thought.    PHYSICAL EXAMINATION:   VITAL SIGNS:     PULSE:  76.     RESPIRATIONS:  16 and unlabored.     02 SATURATIONS:  95% on room air.   GENERAL APPEARANCE:  As usual, I find this patient somewhat lethargic although he is able to carry on a conversation.   CHEST/RESPIRATORY:  Clear air entry bilaterally.    CARDIOVASCULAR:   CARDIAC:  Heart sounds are normal.  He appears to be euvolemic.      GASTROINTESTINAL:   ABDOMEN:  Multiple surgical scars.  No tenderness.  No masses.  No diarrhea in his ostomy.   CIRCULATION:   EDEMA/VARICOSITIES:  Extremities:  No evidence of a  DVT.   MUSCULOSKELETAL:   EXTREMITIES:   LEFT LOWER EXTREMITY:  No obvious tenderness in his left proximal stump.    ASSESSMENT/PLAN:                         Fever last week.  Again, he is going to grow out gram-negative rods in this catheterized specimen.  This has previously been felt to be colonization, usually a drug-resistant Klebsiella.  At this point, I think this is probably colonization rather than true infection.    Recent placement of a Port-a-Cath, which raises another source of possible infection.  If he is running fevers, I may choose to do blood cultures here.  I will check with the staff.  The patient states he has been afebrile and he is usually accurate.    Metastatic urothelial carcinoma.  He has stents in  place due to hydronephrosis.  He has undergone a recent cystoscopy with bilateral stent change on 11/10/2014.  I believe the regimen here is going to be carboplatin and gemcitabine.  Notable that this is not going to be a curative situation.  I do not know that he is going to tolerate any regimen easily, although he wishes to try.    For now, I am going to make sure that the patient is not febrile episodically.  If he is, he may need blood cultures.  I suspect the gram-negative rods in his urine are probably colonizers, although he is on Amoxil.    He needed to have his Coumadin restarted as it was put on hold for placement of the Port-a-Cath.    Finally, his potassium when he was in the ER last week was 3.3.  I note that he is on 30 mEq of potassium.  He does not appear to be on a diuretic.   I really do not understand why this is happening.

## 2014-12-12 ENCOUNTER — Non-Acute Institutional Stay (SKILLED_NURSING_FACILITY): Payer: Medicare Other | Admitting: Internal Medicine

## 2014-12-12 DIAGNOSIS — C688 Malignant neoplasm of overlapping sites of urinary organs: Secondary | ICD-10-CM

## 2014-12-12 DIAGNOSIS — T83511A Infection and inflammatory reaction due to indwelling urethral catheter, initial encounter: Secondary | ICD-10-CM

## 2014-12-12 DIAGNOSIS — N390 Urinary tract infection, site not specified: Secondary | ICD-10-CM

## 2014-12-12 DIAGNOSIS — F3131 Bipolar disorder, current episode depressed, mild: Secondary | ICD-10-CM | POA: Diagnosis not present

## 2014-12-12 DIAGNOSIS — C689 Malignant neoplasm of urinary organ, unspecified: Secondary | ICD-10-CM

## 2014-12-12 DIAGNOSIS — T8351XA Infection and inflammatory reaction due to indwelling urinary catheter, initial encounter: Secondary | ICD-10-CM

## 2014-12-13 ENCOUNTER — Ambulatory Visit (HOSPITAL_BASED_OUTPATIENT_CLINIC_OR_DEPARTMENT_OTHER): Payer: Medicare Other | Admitting: Physician Assistant

## 2014-12-13 ENCOUNTER — Ambulatory Visit (HOSPITAL_BASED_OUTPATIENT_CLINIC_OR_DEPARTMENT_OTHER): Payer: Medicare Other

## 2014-12-13 ENCOUNTER — Other Ambulatory Visit (HOSPITAL_BASED_OUTPATIENT_CLINIC_OR_DEPARTMENT_OTHER): Payer: Medicare Other

## 2014-12-13 ENCOUNTER — Other Ambulatory Visit: Payer: Self-pay | Admitting: Oncology

## 2014-12-13 ENCOUNTER — Encounter: Payer: Self-pay | Admitting: Physician Assistant

## 2014-12-13 VITALS — BP 120/71 | HR 66 | Temp 97.8°F | Resp 18

## 2014-12-13 DIAGNOSIS — C680 Malignant neoplasm of urethra: Secondary | ICD-10-CM | POA: Diagnosis not present

## 2014-12-13 DIAGNOSIS — Z5111 Encounter for antineoplastic chemotherapy: Secondary | ICD-10-CM | POA: Diagnosis not present

## 2014-12-13 DIAGNOSIS — C689 Malignant neoplasm of urinary organ, unspecified: Secondary | ICD-10-CM

## 2014-12-13 DIAGNOSIS — G8220 Paraplegia, unspecified: Secondary | ICD-10-CM

## 2014-12-13 DIAGNOSIS — N289 Disorder of kidney and ureter, unspecified: Secondary | ICD-10-CM

## 2014-12-13 LAB — CBC WITH DIFFERENTIAL/PLATELET
BASO%: 0.3 % (ref 0.0–2.0)
BASOS ABS: 0 10*3/uL (ref 0.0–0.1)
EOS ABS: 0.9 10*3/uL — AB (ref 0.0–0.5)
EOS%: 10.1 % — ABNORMAL HIGH (ref 0.0–7.0)
HCT: 34.6 % — ABNORMAL LOW (ref 38.4–49.9)
HEMOGLOBIN: 11 g/dL — AB (ref 13.0–17.1)
LYMPH%: 15.1 % (ref 14.0–49.0)
MCH: 25.2 pg — ABNORMAL LOW (ref 27.2–33.4)
MCHC: 31.7 g/dL — ABNORMAL LOW (ref 32.0–36.0)
MCV: 79.4 fL (ref 79.3–98.0)
MONO#: 0.9 10*3/uL (ref 0.1–0.9)
MONO%: 9.9 % (ref 0.0–14.0)
NEUT%: 64.6 % (ref 39.0–75.0)
NEUTROS ABS: 6 10*3/uL (ref 1.5–6.5)
PLATELETS: 415 10*3/uL — AB (ref 140–400)
RBC: 4.35 10*6/uL (ref 4.20–5.82)
RDW: 15.9 % — AB (ref 11.0–14.6)
WBC: 9.3 10*3/uL (ref 4.0–10.3)
lymph#: 1.4 10*3/uL (ref 0.9–3.3)

## 2014-12-13 LAB — COMPREHENSIVE METABOLIC PANEL (CC13)
ALK PHOS: 78 U/L (ref 40–150)
ALT: 10 U/L (ref 0–55)
AST: 18 U/L (ref 5–34)
Albumin: 3 g/dL — ABNORMAL LOW (ref 3.5–5.0)
Anion Gap: 10 mEq/L (ref 3–11)
BILIRUBIN TOTAL: 0.32 mg/dL (ref 0.20–1.20)
BUN: 12 mg/dL (ref 7.0–26.0)
CO2: 26 mEq/L (ref 22–29)
Calcium: 9 mg/dL (ref 8.4–10.4)
Chloride: 102 mEq/L (ref 98–109)
Creatinine: 0.9 mg/dL (ref 0.7–1.3)
EGFR: >90 mL/min/{1.73_m2} (ref >90)
Glucose: 91 mg/dl (ref 70–140)
Potassium: 4.3 mEq/L (ref 3.5–5.1)
Sodium: 138 mEq/L (ref 136–145)
Total Protein: 6.8 g/dL (ref 6.4–8.3)

## 2014-12-13 MED ORDER — OXYCODONE-ACETAMINOPHEN 5-325 MG PO TABS
ORAL_TABLET | ORAL | Status: AC
  Filled 2014-12-13: qty 1

## 2014-12-13 MED ORDER — PROMETHAZINE HCL 12.5 MG PO TABS: 12.5000 mg | ORAL_TABLET | Freq: Four times a day (QID) | ORAL | Status: DC | PRN

## 2014-12-13 MED ORDER — SODIUM CHLORIDE 0.9 % IV SOLN
750.0000 mg | Freq: Once | INTRAVENOUS | Status: AC
  Administered 2014-12-13: 750 mg via INTRAVENOUS
  Filled 2014-12-13: qty 75

## 2014-12-13 MED ORDER — HEPARIN SOD (PORK) LOCK FLUSH 100 UNIT/ML IV SOLN
500.0000 [IU] | Freq: Once | INTRAVENOUS | Status: AC | PRN
  Administered 2014-12-13: 500 [IU]
  Filled 2014-12-13: qty 5

## 2014-12-13 MED ORDER — SODIUM CHLORIDE 0.9 % IV SOLN
Freq: Once | INTRAVENOUS | Status: AC
  Administered 2014-12-13: 11:00:00 via INTRAVENOUS

## 2014-12-13 MED ORDER — SODIUM CHLORIDE 0.9 % IJ SOLN
10.0000 mL | INTRAMUSCULAR | Status: DC | PRN
  Administered 2014-12-13: 10 mL
  Filled 2014-12-13: qty 10

## 2014-12-13 MED ORDER — OXYCODONE-ACETAMINOPHEN 5-325 MG PO TABS
1.0000 | ORAL_TABLET | Freq: Once | ORAL | Status: AC
  Administered 2014-12-13: 1 via ORAL

## 2014-12-13 MED ORDER — SODIUM CHLORIDE 0.9 % IV SOLN
Freq: Once | INTRAVENOUS | Status: AC
  Administered 2014-12-13: 13:00:00 via INTRAVENOUS
  Filled 2014-12-13: qty 8

## 2014-12-13 MED ORDER — SODIUM CHLORIDE 0.9 % IV SOLN
2200.0000 mg | Freq: Once | INTRAVENOUS | Status: AC
  Administered 2014-12-13: 2200 mg via INTRAVENOUS
  Filled 2014-12-13: qty 57.86
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
## 2014-12-13 NOTE — Progress Notes (Signed)
Foley cath patent, draining amber  Secretions. Pt c\o  Pain in back. Dr. Alen Blew called...order given for OXY IR. Not availabe at this time. Percoet 5 mg order given Pt stated "I WANT SOMETHING IV". I asked pt if he got IV medication at [facility]. 'Pt stated "NO".  I offered to give patient  Percocet  PO as odered. Pt stated... Tell the doctor "Janace Litten No and to shoved it". I once again offered the percoet  PO to see if it would help his back pain... Stated  No...why don't you shove it up your ass." Nurse left room.Marland KitchenMarland KitchenMarland Kitchen

## 2014-12-13 NOTE — Patient Instructions (Signed)
Samuel [name] Discharge Instructions for Patients Receiving Chemotherapy  Today you received the following chemotherapy agents Gemzar and Carboplatin.  To help prevent nausea and vomiting after your treatment, we encourage you to take your nausea medication Phenergan 12.5mg  every 6 hours as needed.  If you develop nausea and vomiting that is not controlled by your nausea medication, call the clinic.   BELOW ARE SYMPTOMS THAT SHOULD BE REPORTED IMMEDIATELY:  *FEVER GREATER THAN 100.5 F  *CHILLS WITH OR WITHOUT FEVER  NAUSEA AND VOMITING THAT IS NOT CONTROLLED WITH YOUR NAUSEA MEDICATION  *UNUSUAL SHORTNESS OF BREATH  *UNUSUAL BRUISING OR BLEEDING  TENDERNESS IN MOUTH AND THROAT WITH OR WITHOUT PRESENCE OF ULCERS  *URINARY PROBLEMS  *BOWEL PROBLEMS  UNUSUAL RASH Items with * indicate a potential emergency and should be followed up as soon as possible.  Feel free to call the clinic you have any questions or concerns. The clinic phone number is [phone].  Please show the [place] at check-in to the Emergency Department and triage nurse.

## 2014-12-13 NOTE — Progress Notes (Signed)
1440 EMS called at #[phone] for pt to be transported back to Nursing home via ambulance.   1530 EMS given AVS and script for phenergan for pt; pt placed on stretcher via EMS for transportation to [place].

## 2014-12-14 ENCOUNTER — Other Ambulatory Visit: Payer: Self-pay | Admitting: *Deleted

## 2014-12-14 MED ORDER — PREGABALIN 100 MG PO CAPS: ORAL_CAPSULE | ORAL | Status: DC

## 2014-12-14 NOTE — Telephone Encounter (Signed)
[facility] 

## 2014-12-15 ENCOUNTER — Telehealth: Payer: Self-pay | Admitting: *Deleted

## 2014-12-15 NOTE — Telephone Encounter (Signed)
This RN spoke with patient's nurse at [facility] regarding first time chemotherapy. Per the nurse, the patient is presently sleeping and has had no complaints since, being treated. He has been eating, drinking, voiding, no nausea, vomiting, diarrhea or pain. Instructed the nurse to call [phone] if they had any further questions or concerns.

## 2014-12-15 NOTE — Patient Instructions (Signed)
Follow-up in 2 weeks prior to the start of your next scheduled cycle of chemotherapy 

## 2014-12-15 NOTE — Telephone Encounter (Signed)
-----   Message from Domenic Schwab, RN sent at 12/13/2014  3:40 PM EDT ----- Regarding: Shadad---chemo follow up  1st Gemzar/Carbo

## 2014-12-17 NOTE — Progress Notes (Addendum)
Patient ID: Samuel [name], male   DOB: 11-13-1955, 59 y.o.   MRN: [id number]                HISTORY & PHYSICAL        DATE:  12/12/2014             FACILITY: [facility]                            LEVEL OF CARE:   SNF   CHIEF COMPLAINT:  Review post hospitalization, 12/05/2014 through 12/08/2014.       HISTORY OF PRESENT ILLNESS:  I had seen Mr. Rossman last week.  He had been to the emergency room with a temperature.  Ultimately, his urine culture revealed multidrug resistant Klebsiella.  This was sensitive only to imipenem and tobramycin.  He was put on Primaxin.  His white count on admission was 12.6, down to 10.3.  Differential count was essentially normal.  The patient has a chronic Foley catheter.  Recently had a Port-a-Cath put in for chemotherapy (urothelial carcinoma).   Strangely enough, I do not see that blood cultures were actually done.    CURRENT MEDICATIONS:  Medication list is reviewed.                 Imipenem 500 every 6 hours.    Clonazepam 0.5, 1 tablet three times daily as needed.    OxyIR 5 mg every 4 hours as needed for severe pain.    Elavil 25, 2 tablets/50 mg at bedtime for seven days, then 3 tablets/75 mg at bedtime.    Atorvastatin 10 q.d.         Os-Cal 1250 daily.    Chlorpheniramine 8 mg at bedtime p.r.n. for allergies.    Exelon 60 mg b.i.d.     Colace 100 b.i.d.        Pepcid 20 b.i.d.       Ferrous sulfate 325 b.i.d.       Guaifenesin 300 by mouth three times daily p.r.n. for cough.    DuoNebs 1 q.4 hours p.r.n. for wheezing and congestion.    Lamictal 200 mg b.i.d. (bipolar disorder).    Reglan 5 mg four times daily.    Lopressor 25 b.i.d.        Nitroglycerin 0.4 p.r.n.    Zofran 4 mg every 8 hours p.r.n.       Ditropan XL 5 mg daily.      Systane ophthalmic.        Coumadin 7 mg daily.      REVIEW OF SYSTEMS:    CHEST/RESPIRATORY:  The patient states he has no cough or congestion.   CARDIAC:  No chest pain.     GI:  No diarrhea.  No abdominal pain.    GU:  Foley catheter in place.  He has not noted any fever.    MUSCULOSKELETAL:  Continually complains of pain in his left leg.  States he cannot sleep because of this.    PHYSICAL EXAMINATION:   GENERAL APPEARANCE:  The patient is alert, awake, conversational.   CHEST/RESPIRATORY:  Clear air entry bilaterally.    CARDIOVASCULAR:   CARDIAC:  Heart sounds are normal.   GASTROINTESTINAL:   ABDOMEN:  Surgical scars.   Colostomy bag.  No diarrhea is noted.   LIVER/SPLEEN/KIDNEYS:  No liver, no spleen.  No tenderness.     GENITOURINARY:   BLADDER:  Foley catheter draining somewhat sanguineous  urine.   CIRCULATION:   EDEMA/VARICOSITIES:  Extremities:  No evidence of a DVT in his right leg.      SKIN:   INSPECTION:  Proximal left above-knee amputation site appears stable.    ASSESSMENT/PLAN:                            Multidrug resistant Klebsiella.  Other than the imipenem, the only drug that it had any sensitivity to was tobramycin.  Presumably, this time this was a true infection.    On Coumadin for a history of pulmonary embolism.  We will check a PT and INR on him tomorrow.    Recent Port-a-Cath for chemotherapy for his urothelial carcinoma.  If he has further fevers, it probably would be nice for blood cultures to be done.    History of bipolar disorder.  On a significant quantity of psychoactive medications.  He appears to be tolerating this fairly well.    Pain in his left leg.  I am going to add OxyContin at night.  Leave the p.r.n. OxyIR.   He did not seem to do well with the Fentanyl, resulting in almost a delirious presentation on occasion.  I am not planning to restart this.    Bipolar disorder: not unstable

## 2014-12-27 ENCOUNTER — Ambulatory Visit (HOSPITAL_BASED_OUTPATIENT_CLINIC_OR_DEPARTMENT_OTHER): Payer: Medicare Other | Admitting: Oncology

## 2014-12-27 ENCOUNTER — Ambulatory Visit (HOSPITAL_BASED_OUTPATIENT_CLINIC_OR_DEPARTMENT_OTHER): Payer: Medicare Other

## 2014-12-27 ENCOUNTER — Other Ambulatory Visit (HOSPITAL_BASED_OUTPATIENT_CLINIC_OR_DEPARTMENT_OTHER): Payer: Medicare Other

## 2014-12-27 VITALS — BP 86/51 | HR 66 | Temp 97.1°F | Resp 18

## 2014-12-27 DIAGNOSIS — Z86711 Personal history of pulmonary embolism: Secondary | ICD-10-CM

## 2014-12-27 DIAGNOSIS — C689 Malignant neoplasm of urinary organ, unspecified: Secondary | ICD-10-CM

## 2014-12-27 DIAGNOSIS — C679 Malignant neoplasm of bladder, unspecified: Secondary | ICD-10-CM

## 2014-12-27 DIAGNOSIS — Z5111 Encounter for antineoplastic chemotherapy: Secondary | ICD-10-CM | POA: Diagnosis not present

## 2014-12-27 DIAGNOSIS — Z7901 Long term (current) use of anticoagulants: Secondary | ICD-10-CM | POA: Diagnosis not present

## 2014-12-27 LAB — COMPREHENSIVE METABOLIC PANEL (CC13)
ALK PHOS: 75 U/L (ref 40–150)
ALT: 15 U/L (ref 0–55)
AST: 19 U/L (ref 5–34)
Albumin: 2.6 g/dL — ABNORMAL LOW (ref 3.5–5.0)
Anion Gap: 9 mEq/L (ref 3–11)
BILIRUBIN TOTAL: 0.26 mg/dL (ref 0.20–1.20)
BUN: 20.5 mg/dL (ref 7.0–26.0)
CHLORIDE: 106 meq/L (ref 98–109)
CO2: 23 mEq/L (ref 22–29)
CREATININE: 0.8 mg/dL (ref 0.7–1.3)
Calcium: 8 mg/dL — ABNORMAL LOW (ref 8.4–10.4)
EGFR: >90 mL/min/{1.73_m2} (ref >90)
Glucose: 99 mg/dl (ref 70–140)
POTASSIUM: 4.2 meq/L (ref 3.5–5.1)
Sodium: 137 mEq/L (ref 136–145)
Total Protein: 5.9 g/dL — ABNORMAL LOW (ref 6.4–8.3)

## 2014-12-27 LAB — CBC WITH DIFFERENTIAL/PLATELET
BASO%: 0.7 % (ref 0.0–2.0)
Basophils Absolute: 0 10*3/uL (ref 0.0–0.1)
EOS%: 3.7 % (ref 0.0–7.0)
Eosinophils Absolute: 0.1 10*3/uL (ref 0.0–0.5)
HCT: 25.3 % — ABNORMAL LOW (ref 38.4–49.9)
HGB: 8.2 g/dL — ABNORMAL LOW (ref 13.0–17.1)
LYMPH#: 0.9 10*3/uL (ref 0.9–3.3)
LYMPH%: 22.1 % (ref 14.0–49.0)
MCH: 25.7 pg — ABNORMAL LOW (ref 27.2–33.4)
MCHC: 32.3 g/dL (ref 32.0–36.0)
MCV: 79.7 fL (ref 79.3–98.0)
MONO#: 0.7 10*3/uL (ref 0.1–0.9)
MONO%: 17.3 % — ABNORMAL HIGH (ref 0.0–14.0)
NEUT#: 2.2 10*3/uL (ref 1.5–6.5)
NEUT%: 56.2 % (ref 39.0–75.0)
Platelets: 157 10*3/uL (ref 140–400)
RBC: 3.17 10*6/uL — ABNORMAL LOW (ref 4.20–5.82)
RDW: 15.8 % — ABNORMAL HIGH (ref 11.0–14.6)
WBC: 3.9 10*3/uL — AB (ref 4.0–10.3)

## 2014-12-27 MED ORDER — SODIUM CHLORIDE 0.9 % IJ SOLN
10.0000 mL | INTRAMUSCULAR | Status: DC | PRN
Start: 2014-12-27 — End: 2014-12-27
  Administered 2014-12-27: 10 mL
  Filled 2014-12-27: qty 10

## 2014-12-27 MED ORDER — SODIUM CHLORIDE 0.9 % IV SOLN
2200.0000 mg | Freq: Once | INTRAVENOUS | Status: AC
  Administered 2014-12-27: 2200 mg via INTRAVENOUS
  Filled 2014-12-27: qty 57.86

## 2014-12-27 MED ORDER — SODIUM CHLORIDE 0.9 % IV SOLN
Freq: Once | INTRAVENOUS | Status: AC
  Administered 2014-12-27: 09:00:00 via INTRAVENOUS

## 2014-12-27 MED ORDER — SODIUM CHLORIDE 0.9 % IV SOLN
750.0000 mg | Freq: Once | INTRAVENOUS | Status: AC
  Administered 2014-12-27: 750 mg via INTRAVENOUS
  Filled 2014-12-27: qty 75

## 2014-12-27 MED ORDER — SODIUM CHLORIDE 0.9 % IV SOLN
Freq: Once | INTRAVENOUS | Status: AC
  Administered 2014-12-27: 11:00:00 via INTRAVENOUS
  Filled 2014-12-27: qty 8

## 2014-12-27 MED ORDER — HEPARIN SOD (PORK) LOCK FLUSH 100 UNIT/ML IV SOLN
500.0000 [IU] | Freq: Once | INTRAVENOUS | Status: AC | PRN
  Administered 2014-12-27: 500 [IU]
  Filled 2014-12-27: qty 5

## 2014-12-27 NOTE — Progress Notes (Signed)
Dr. Alen Blew assessed pt at bedside. OK to treat with B/P 86/51. Pt asymptomatic. Foley catheter in place draining amber urine.  12:15. [facility] [phone] called to request patient pick-up for return transport to [facility] and [facility].  1300: Patient picked up for return to [facility].

## 2014-12-27 NOTE — Patient Instructions (Signed)
[facility] Discharge Instructions for Patients Receiving Chemotherapy  Today you received the following chemotherapy agents Gemzar and Carboplatin.  To help prevent nausea and vomiting after your treatment, we encourage you to take your nausea medication Phenergan 12.5mg  every 6 hours as needed.  If you develop nausea and vomiting that is not controlled by your nausea medication, call the clinic.   BELOW ARE SYMPTOMS THAT SHOULD BE REPORTED IMMEDIATELY:  *FEVER GREATER THAN 100.5 F  *CHILLS WITH OR WITHOUT FEVER  NAUSEA AND VOMITING THAT IS NOT CONTROLLED WITH YOUR NAUSEA MEDICATION  *UNUSUAL SHORTNESS OF BREATH  *UNUSUAL BRUISING OR BLEEDING  TENDERNESS IN MOUTH AND THROAT WITH OR WITHOUT PRESENCE OF ULCERS  *URINARY PROBLEMS  *BOWEL PROBLEMS  UNUSUAL RASH Items with * indicate a potential emergency and should be followed up as soon as possible.  Feel free to call the clinic you have any questions or concerns. The clinic phone number is [phone].  Please show the [facility] at check-in to the Emergency Department and triage nurse.

## 2014-12-27 NOTE — Progress Notes (Signed)
Hematology and Oncology Follow Up Visit  Samuel [name] [id number] Jul 21, 1956 59 y.o. 12/27/2014 10:09 AM [name], MDRobson, Memory Argue, MD   Principle Diagnosis: 59 year old gentleman with urothelial carcinoma presenting with large bladder mass hydronephrosis and pelvic adenopathy in March 2016.   Prior Therapy: He is status post TURBT on 09/26/2014 pathology showed high-grade urothelial carcinoma.  Current therapy: Carboplatin and Gemzar palliative chemotherapy started on 12/13/2014. He is here for cycle 2 day 1  Interim History:  [name] presents today for a follow-up visit. Since the last visit, he received the first cycle of chemotherapy without any major complications. He only received a day 1 of the cycle due to his previous hospitalization. He did not report any nausea, vomiting or difficulty breathing. He does not report any peripheral neuropathy. He has very limited performance status and bedridden and have been chronically in that state.  He does not report any headaches, blurry vision, syncope. He does not report any fevers, chills, sweats. His appetite continues to be reasonable. He does not report any chest pain, palpitation or orthopnea. He does not report any cough or hemoptysis or hematemesis. Does not report any nausea or vomiting or abdominal pain. Does not report any hematochezia or melena. Does not report any frequency urgency or hesitancy. Remaining review of systems unremarkable.   Medications: I have reviewed the patient's current medications.  Current Outpatient Prescriptions  Medication Sig Dispense Refill  . acetaminophen (TYLENOL) 325 MG tablet Take 650 mg by mouth every 6 (six) hours as needed for moderate pain.    Marland Kitchen alum & mag hydroxide-simeth (MAALOX/MYLANTA) 200-200-20 MG/5ML suspension Take 20 mLs by mouth 2 (two) times daily as needed for indigestion or heartburn.    Marland Kitchen amitriptyline (ELAVIL) 25 MG tablet Take 25 mg by mouth See admin  instructions. Take 1 tablet (25 mg) daily at bedtime until 11/29/14 (not start date), then take 2 tablets (50 mg) daily at bedtime for 7 days, then take 3 tablets (75 mg) daily at bedtime    . atorvastatin (LIPITOR) 10 MG tablet Take 10 mg by mouth at bedtime.     . calcium carbonate (OS-CAL - DOSED IN MG OF ELEMENTAL CALCIUM) 1250 MG tablet Take 1 tablet by mouth daily with breakfast.    . calcium carbonate (TUMS - DOSED IN MG ELEMENTAL CALCIUM) 500 MG chewable tablet Chew 4 tablets by mouth every 4 (four) hours as needed for indigestion or heartburn.     . chlorpheniramine (CHLOR-TRIMETON) 4 MG tablet Take 8 mg by mouth at bedtime as needed for allergies (cough).    . clonazePAM (KLONOPIN) 0.5 MG tablet Take one tablet by mouth three times daily as needed for anxiety 10 tablet 0  . dexlansoprazole (DEXILANT) 60 MG capsule Take 60 mg by mouth 2 (two) times daily.     Marland Kitchen docusate sodium (COLACE) 100 MG capsule Take 1 capsule (100 mg total) by mouth 2 (two) times daily. 60 capsule 0  . famotidine (PEPCID) 20 MG tablet Take 20 mg by mouth at bedtime.    . ferrous sulfate 325 (65 FE) MG tablet Take 325 mg by mouth 2 (two) times daily with a meal.    . folic acid (FOLVITE) 1 MG tablet Take 1 mg by mouth daily.     Marland Kitchen GENERLAC 10 GM/15ML SOLN Take 20 g by mouth 4 (four) times daily. 9am, 1pm, 5pm, 9pm    . guaiFENesin (ROBITUSSIN) 100 MG/5ML liquid Take 300 mg by mouth 3 (three) times daily as  needed for cough.     Marland Kitchen ipratropium-albuterol (DUONEB) 0.5-2.5 (3) MG/3ML SOLN Take 3 mLs by nebulization See admin instructions. Inhale 1 vial 4 times daily for lung disease; may also take every 4 hours as needed for wheezing/ congestion    . lamoTRIgine (LAMICTAL) 200 MG tablet Take 200 mg by mouth 2 (two) times daily. To stabilize bipolar mood    . lidocaine (LIDODERM) 5 % Place 1 patch onto the skin daily. Remove & Discard patch within 12 hours or as directed by MD (apply 2 patches to left stump daily at 9pm    .  metoCLOPramide (REGLAN) 5 MG tablet Take 5 mg by mouth 4 (four) times daily -  before meals and at bedtime.     . metoprolol tartrate (LOPRESSOR) 25 MG tablet Take 1 tablet (25 mg total) by mouth 2 (two) times daily. 60 tablet 1  . nitroGLYCERIN (NITROSTAT) 0.4 MG SL tablet Place 0.4 mg under the tongue every 5 (five) minutes as needed for chest pain. Up to 3 doses    . ondansetron (ZOFRAN) 4 MG tablet Take 4 mg by mouth every 8 (eight) hours as needed for nausea or vomiting.     Marland Kitchen oxybutynin (DITROPAN-XL) 5 MG 24 hr tablet Take 5 mg by mouth daily.     Marland Kitchen oxyCODONE (OXY IR/ROXICODONE) 5 MG immediate release tablet Take 1 tablet (5 mg total) by mouth every 4 (four) hours as needed for severe pain. 10 tablet 0  . Polyethyl Glycol-Propyl Glycol (SYSTANE OP) Place 2 drops into both eyes 3 (three) times daily. 8am, 2pm, 8pm    . potassium chloride SA (K-DUR,KLOR-CON) 20 MEQ tablet Take 20 mEq by mouth 2 (two) times daily.     . pregabalin (LYRICA) 100 MG capsule Take one capsule by mouth three times daily for neuropathy pain 90 capsule 5  . pregabalin (LYRICA) 75 MG capsule Take one capsule by mouth three times daily for neuropathy (Patient taking differently: Take 75 mg by mouth 3 (three) times daily. for neuropathy) 90 capsule 5  . promethazine (PHENERGAN) 12.5 MG tablet Take 1 tablet (12.5 mg total) by mouth every 6 (six) hours as needed for nausea or vomiting. 30 tablet 0  . QUEtiapine (SEROQUEL) 200 MG tablet Take 200 mg by mouth 2 (two) times daily.    Marland Kitchen senna (SENOKOT) 8.6 MG TABS tablet Take 2 tablets (17.2 mg total) by mouth daily. 60 each 0  . vitamin B-12 (CYANOCOBALAMIN) 1000 MCG tablet Take 1,000 mcg by mouth every other day.     . Vitamin D, Ergocalciferol, (DRISDOL) 50000 UNITS CAPS capsule Take 50,000 Units by mouth every 30 (thirty) days. Takes on the 11th of the month    . warfarin (COUMADIN) 7.5 MG tablet Take 7.5 mg by mouth See admin instructions. Warfarin given daily at 5pm, dose  changing every few days based on INR per [facility], 7.5 mg given on 5/6 and 5/7; per MAR 5 mg to be given on 12/03/14, with INR recheck on Monday.     No current facility-administered medications for this visit.     Allergies:  Allergies  Allergen Reactions  . Tomato Other (See Comments)    Causes acid reflux    Past Medical History, Surgical history, Social history, and Family History were reviewed and updated.   Physical Exam: ECOG: 3 General appearance: alert and cooperative Head: Normocephalic, without obvious abnormality Neck: no adenopathy Lymph nodes: Cervical, supraclavicular, and axillary nodes normal. Heart:regular rate and rhythm, S1, S2  normal, no murmur, click, rub or gallop Lung:chest clear, no wheezing, rales, normal symmetric air entry, Heart exam - S1, S2 normal, no murmur, no gallop, rate regular Abdomin: soft, non-tender, without masses or organomegaly EXT:no erythema, induration, or nodules   Lab Results: Lab Results  Component Value Date   WBC 3.9* 12/27/2014   HGB 8.2* 12/27/2014   HCT 25.3* 12/27/2014   MCV 79.7 12/27/2014   PLT 157 12/27/2014     Chemistry      Component Value Date/Time   NA 138 12/13/2014 1114   NA 138 12/06/2014 0429   K 4.3 12/13/2014 1114   K 3.9 12/06/2014 0429   CL 103 12/06/2014 0429   CO2 26 12/13/2014 1114   CO2 24 12/06/2014 0429   BUN 12.0 12/13/2014 1114   BUN 15 12/06/2014 0429   CREATININE 0.9 12/13/2014 1114   CREATININE 0.84 12/06/2014 0429      Component Value Date/Time   CALCIUM 9.0 12/13/2014 1114   CALCIUM 8.7* 12/06/2014 0429   ALKPHOS 78 12/13/2014 1114   ALKPHOS 66 12/06/2014 0429   AST 18 12/13/2014 1114   AST 18 12/06/2014 0429   ALT 10 12/13/2014 1114   ALT 9* 12/06/2014 0429   BILITOT 0.32 12/13/2014 1114   BILITOT 0.2* 12/06/2014 0429        Impression and Plan:  59 year old gentleman with the following issues:  1. Urothelial carcinoma diagnosed in March 2016 after presenting with a  low hydronephrosis, hematuria and acute renal failure. He underwent a TURBT on 09/26/2014 and found to have high-grade urothelial carcinoma. CT scan did show pelvic adenopathy and bilateral hydronephrosis associated with that.  He is receiving systemic chemotherapy and have tolerated the first cycle well. The plan is to proceed with cycle 2 day 1 today without any dose reduction or delay. He will have day 8 in one week which could be held if his blood counts are below the threshold. He will have a CT scan scheduled after cycle 3.  2. IV access: Port-A-Cath placed without any complications.  3. Antiemetics: He will prescribe Zofran as needed nausea medication.  4. History of pulmonary embolism: He is chronically anticoagulated with warfarin and currently has an IVC filter in place.  5. Follow-up: Will be with the next chemotherapy which will be in one week from today.  [facility], MD 6/1/201610:09 AM

## 2014-12-28 ENCOUNTER — Telehealth: Payer: Self-pay | Admitting: *Deleted

## 2014-12-28 ENCOUNTER — Telehealth: Payer: Self-pay | Admitting: Oncology

## 2014-12-28 NOTE — Telephone Encounter (Signed)
Per staff message and POF I have scheduled appts. Advised scheduler of appts. JMW  

## 2014-12-28 NOTE — Telephone Encounter (Signed)
Lft msg for pt confirming labs/ov per 06/01 POF, sent msg to add chemo and mailed schedule to pt... KJ  °

## 2015-01-03 ENCOUNTER — Other Ambulatory Visit (HOSPITAL_BASED_OUTPATIENT_CLINIC_OR_DEPARTMENT_OTHER): Payer: Medicare Other

## 2015-01-03 ENCOUNTER — Ambulatory Visit (HOSPITAL_BASED_OUTPATIENT_CLINIC_OR_DEPARTMENT_OTHER): Payer: Medicare Other

## 2015-01-03 ENCOUNTER — Other Ambulatory Visit: Payer: Self-pay | Admitting: *Deleted

## 2015-01-03 VITALS — BP 97/60 | HR 84 | Temp 98.0°F | Resp 23

## 2015-01-03 DIAGNOSIS — C679 Malignant neoplasm of bladder, unspecified: Secondary | ICD-10-CM

## 2015-01-03 DIAGNOSIS — C689 Malignant neoplasm of urinary organ, unspecified: Secondary | ICD-10-CM

## 2015-01-03 DIAGNOSIS — Z5111 Encounter for antineoplastic chemotherapy: Secondary | ICD-10-CM

## 2015-01-03 LAB — COMPREHENSIVE METABOLIC PANEL (CC13)
ALK PHOS: 78 U/L (ref 40–150)
ALT: 26 U/L (ref 0–55)
ANION GAP: 7 meq/L (ref 3–11)
AST: 31 U/L (ref 5–34)
Albumin: 2.7 g/dL — ABNORMAL LOW (ref 3.5–5.0)
BUN: 13.5 mg/dL (ref 7.0–26.0)
CO2: 23 meq/L (ref 22–29)
Calcium: 8.3 mg/dL — ABNORMAL LOW (ref 8.4–10.4)
Chloride: 107 mEq/L (ref 98–109)
Creatinine: 0.8 mg/dL (ref 0.7–1.3)
EGFR: >90 mL/min/{1.73_m2} (ref >90)
GLUCOSE: 113 mg/dL (ref 70–140)
POTASSIUM: 4 meq/L (ref 3.5–5.1)
SODIUM: 137 meq/L (ref 136–145)
TOTAL PROTEIN: 6.2 g/dL — AB (ref 6.4–8.3)
Total Bilirubin: 0.36 mg/dL (ref 0.20–1.20)

## 2015-01-03 LAB — CBC WITH DIFFERENTIAL/PLATELET
BASO%: 0.4 % (ref 0.0–2.0)
BASOS ABS: 0 10*3/uL (ref 0.0–0.1)
EOS%: 0.4 % (ref 0.0–7.0)
Eosinophils Absolute: 0 10*3/uL (ref 0.0–0.5)
HCT: 24.6 % — ABNORMAL LOW (ref 38.4–49.9)
HEMOGLOBIN: 8 g/dL — AB (ref 13.0–17.1)
LYMPH#: 0.7 10*3/uL — AB (ref 0.9–3.3)
LYMPH%: 28.2 % (ref 14.0–49.0)
MCH: 25.8 pg — AB (ref 27.2–33.4)
MCHC: 32.5 g/dL (ref 32.0–36.0)
MCV: 79.4 fL (ref 79.3–98.0)
MONO#: 0.1 10*3/uL (ref 0.1–0.9)
MONO%: 5.6 % (ref 0.0–14.0)
NEUT#: 1.6 10*3/uL (ref 1.5–6.5)
NEUT%: 65.4 % (ref 39.0–75.0)
Platelets: 146 10*3/uL (ref 140–400)
RBC: 3.1 10*6/uL — ABNORMAL LOW (ref 4.20–5.82)
RDW: 15.8 % — ABNORMAL HIGH (ref 11.0–14.6)
WBC: 2.5 10*3/uL — ABNORMAL LOW (ref 4.0–10.3)

## 2015-01-03 MED ORDER — PROCHLORPERAZINE MALEATE 10 MG PO TABS
10.0000 mg | ORAL_TABLET | Freq: Once | ORAL | Status: AC
  Administered 2015-01-03: 10 mg via ORAL

## 2015-01-03 MED ORDER — PROCHLORPERAZINE MALEATE 10 MG PO TABS
ORAL_TABLET | ORAL | Status: AC
  Filled 2015-01-03: qty 1

## 2015-01-03 MED ORDER — SODIUM CHLORIDE 0.9 % IJ SOLN
10.0000 mL | INTRAMUSCULAR | Status: DC | PRN
  Administered 2015-01-03: 10 mL
  Filled 2015-01-03: qty 10

## 2015-01-03 MED ORDER — HEPARIN SOD (PORK) LOCK FLUSH 100 UNIT/ML IV SOLN
500.0000 [IU] | Freq: Once | INTRAVENOUS | Status: AC | PRN
  Administered 2015-01-03: 500 [IU]
  Filled 2015-01-03: qty 5

## 2015-01-03 MED ORDER — SODIUM CHLORIDE 0.9 % IV SOLN
2200.0000 mg | Freq: Once | INTRAVENOUS | Status: AC
  Administered 2015-01-03: 2200 mg via INTRAVENOUS
  Filled 2015-01-03: qty 57.86

## 2015-01-03 MED ORDER — SODIUM CHLORIDE 0.9 % IV SOLN
Freq: Once | INTRAVENOUS | Status: AC
  Administered 2015-01-03: 10:00:00 via INTRAVENOUS

## 2015-01-03 NOTE — Patient Instructions (Signed)
[facility] Discharge Instructions for Patients Receiving Chemotherapy  Today you received the following chemotherapy agents Gemzar.  To help prevent nausea and vomiting after your treatment, we encourage you to take your nausea medication.   If you develop nausea and vomiting that is not controlled by your nausea medication, call the clinic.   BELOW ARE SYMPTOMS THAT SHOULD BE REPORTED IMMEDIATELY:  *FEVER GREATER THAN 100.5 F  *CHILLS WITH OR WITHOUT FEVER  NAUSEA AND VOMITING THAT IS NOT CONTROLLED WITH YOUR NAUSEA MEDICATION  *UNUSUAL SHORTNESS OF BREATH  *UNUSUAL BRUISING OR BLEEDING  TENDERNESS IN MOUTH AND THROAT WITH OR WITHOUT PRESENCE OF ULCERS  *URINARY PROBLEMS  *BOWEL PROBLEMS  UNUSUAL RASH Items with * indicate a potential emergency and should be followed up as soon as possible.  Feel free to call the clinic you have any questions or concerns. The clinic phone number is [phone].  Please show the CHEMO ALERT CARD at check-in to the Emergency Department and triage nurse.   

## 2015-01-03 NOTE — Progress Notes (Signed)
Labs obtained from Port-CBC with diff and CMET.  Foley catheter intact and draining med amber urine.  Denies any discomfort. Afebrile

## 2015-01-09 ENCOUNTER — Non-Acute Institutional Stay (SKILLED_NURSING_FACILITY): Payer: Medicare Other | Admitting: Internal Medicine

## 2015-01-09 DIAGNOSIS — C688 Malignant neoplasm of overlapping sites of urinary organs: Secondary | ICD-10-CM | POA: Diagnosis not present

## 2015-01-09 DIAGNOSIS — T8351XA Infection and inflammatory reaction due to indwelling urinary catheter, initial encounter: Secondary | ICD-10-CM

## 2015-01-09 DIAGNOSIS — N390 Urinary tract infection, site not specified: Secondary | ICD-10-CM

## 2015-01-09 DIAGNOSIS — T83511A Infection and inflammatory reaction due to indwelling urethral catheter, initial encounter: Principal | ICD-10-CM

## 2015-01-09 DIAGNOSIS — C689 Malignant neoplasm of urinary organ, unspecified: Secondary | ICD-10-CM

## 2015-01-09 DIAGNOSIS — D702 Other drug-induced agranulocytosis: Secondary | ICD-10-CM

## 2015-01-09 NOTE — Progress Notes (Signed)
Patient ID: Samuel [name], male   DOB: [birth year], 59 y.o.   MRN: [id number] Facility; [facility] Chief complaint; follow-up fever question UTI, chemotherapy induced pancytopenia History; Samuel [name] is being treated for urothelial cancer with a palliative chemotherapy schedule at the cancer center. Last week I was away on medication. He was apparently noted to have low-grade fevers per the staff in the 100 range per Samuel [name] who has his own thermometer in the 100.5-101 range. These were not sustained. It is also noted that he has become somewhat more lethargic although well when providers in the room he can wake up and seems quite normal mentally. He has a chronic Foley catheter which is been a source of either infection [ admitted to hospital last month] and/or colonization. He has been on anti-biotics for predominantly a Klebsiella in the urine treated this is multidrug resistant.  Lab work from 6/13 showed a white count of 500, hemoglobin of 7.5, platelet count of 46. His comprehensive metabolic panel quite normal with a sodium of 135 potassium of 3.5 total CO2 of 19 BUN of 18 creatinine of 0.97 liver function tests are normal his albumen is 3.1. This is lab work done in the facility. A blood culture from 6/8 showed no growth. There were 2 blood cultures done one from his port and one peripherally I'm not sure which one this is however it is negative. In terms of any biotics he was put on Rocephin on 6/8, this was changed to Invanz in response to I think Klebsiella and ESBL Escherichia coli in his urine.  Review of systems Gen. patient states he feels like "crap" Respiratory; cough occasional production of greenish sputum although this does not appear to be much different from his chronic cough complaint which we have felt is related to GERD. He does not complain of chest pain or shortness of breath Cardiac no clear exertional chest symptoms GI no nausea vomiting or dysphagia no  diarrhea Skin no pressure sores than I am aware of  Physical examination Gen. when I walked in the room I thought I was going to find the patient delirious however he rapidly awakens and is conversationa/ lucid Vitals; his temperature is 90.8 70 apparently did not have a fever all day yesterday. 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 There was some tenderness in the left lower quadrant to deep palpation but certainly no guarding or rebound GU no suprapubic or costovertebral angle tenderness is noted  Impression/plan #1 intermittent fevers in the face of now neutropenic white blood counts. I will contact the cancer center to discuss this with the oncologists. #2 questionable UTI. I certainly agree in the face of the what is going on a treatment is warranted although we have the overall haunting thought of treating colonization.  I do not have the urine culture that led to the [place]. One of his blood cultures is negative as noted above #3 cough; this is not a new issue I think he has severe chronic esophageal reflux. His exam is normal. Nevertheless in the face of things I think we should be vigilant right orders to monitor his respiratory status O2 sats etc. 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 I will call the cancer center is noted  CBC Latest Ref Rng 01/03/2015 12/27/2014 12/13/2014  WBC 4.0 -  10.3 10e3/uL 2.5(L) 3.9(L) 9.3  Hemoglobin 13.0 - 17.1 g/dL 8.0(L) 8.2(L) 11.0(L)  Hematocrit 38.4 - 49.9 % 24.6(L) 25.3(L) 34.6(L)  Platelets 140 - 400 10e3/uL 146 157 415(H)

## 2015-01-10 ENCOUNTER — Other Ambulatory Visit: Payer: Self-pay | Admitting: *Deleted

## 2015-01-10 ENCOUNTER — Encounter (HOSPITAL_COMMUNITY): Payer: Self-pay | Admitting: Emergency Medicine

## 2015-01-10 ENCOUNTER — Other Ambulatory Visit (HOSPITAL_COMMUNITY): Payer: Self-pay

## 2015-01-10 ENCOUNTER — Other Ambulatory Visit: Payer: Self-pay

## 2015-01-10 ENCOUNTER — Inpatient Hospital Stay (HOSPITAL_COMMUNITY)
Admission: EM | Admit: 2015-01-10 | Discharge: 2015-01-13 | DRG: 809 | Disposition: A | Discharge status: Skilled Nursing Facility | Payer: Medicare Other | Attending: Internal Medicine | Admitting: Internal Medicine

## 2015-01-10 DIAGNOSIS — C689 Malignant neoplasm of urinary organ, unspecified: Secondary | ICD-10-CM

## 2015-01-10 DIAGNOSIS — R310 Gross hematuria: Secondary | ICD-10-CM | POA: Diagnosis present

## 2015-01-10 DIAGNOSIS — Z87440 Personal history of urinary (tract) infections: Secondary | ICD-10-CM

## 2015-01-10 DIAGNOSIS — Z86711 Personal history of pulmonary embolism: Secondary | ICD-10-CM

## 2015-01-10 DIAGNOSIS — G40909 Epilepsy, unspecified, not intractable, without status epilepticus: Secondary | ICD-10-CM | POA: Diagnosis present

## 2015-01-10 DIAGNOSIS — K219 Gastro-esophageal reflux disease without esophagitis: Secondary | ICD-10-CM | POA: Diagnosis present

## 2015-01-10 DIAGNOSIS — Z9289 Personal history of other medical treatment: Secondary | ICD-10-CM

## 2015-01-10 DIAGNOSIS — B1920 Unspecified viral hepatitis C without hepatic coma: Secondary | ICD-10-CM | POA: Diagnosis present

## 2015-01-10 DIAGNOSIS — Z9049 Acquired absence of other specified parts of digestive tract: Secondary | ICD-10-CM | POA: Diagnosis present

## 2015-01-10 DIAGNOSIS — G8220 Paraplegia, unspecified: Secondary | ICD-10-CM | POA: Diagnosis present

## 2015-01-10 DIAGNOSIS — T451X5A Adverse effect of antineoplastic and immunosuppressive drugs, initial encounter: Secondary | ICD-10-CM | POA: Diagnosis present

## 2015-01-10 DIAGNOSIS — D62 Acute posthemorrhagic anemia: Secondary | ICD-10-CM | POA: Diagnosis present

## 2015-01-10 DIAGNOSIS — D6481 Anemia due to antineoplastic chemotherapy: Secondary | ICD-10-CM | POA: Diagnosis present

## 2015-01-10 DIAGNOSIS — Z7901 Long term (current) use of anticoagulants: Secondary | ICD-10-CM

## 2015-01-10 DIAGNOSIS — C679 Malignant neoplasm of bladder, unspecified: Secondary | ICD-10-CM | POA: Diagnosis present

## 2015-01-10 DIAGNOSIS — D61810 Antineoplastic chemotherapy induced pancytopenia: Principal | ICD-10-CM | POA: Diagnosis present

## 2015-01-10 DIAGNOSIS — F319 Bipolar disorder, unspecified: Secondary | ICD-10-CM | POA: Diagnosis present

## 2015-01-10 DIAGNOSIS — D630 Anemia in neoplastic disease: Secondary | ICD-10-CM

## 2015-01-10 DIAGNOSIS — Z87891 Personal history of nicotine dependence: Secondary | ICD-10-CM

## 2015-01-10 DIAGNOSIS — N319 Neuromuscular dysfunction of bladder, unspecified: Secondary | ICD-10-CM | POA: Diagnosis present

## 2015-01-10 DIAGNOSIS — N3091 Cystitis, unspecified with hematuria: Secondary | ICD-10-CM | POA: Diagnosis present

## 2015-01-10 DIAGNOSIS — R319 Hematuria, unspecified: Secondary | ICD-10-CM

## 2015-01-10 DIAGNOSIS — Z89612 Acquired absence of left leg above knee: Secondary | ICD-10-CM

## 2015-01-10 DIAGNOSIS — E785 Hyperlipidemia, unspecified: Secondary | ICD-10-CM | POA: Diagnosis present

## 2015-01-10 DIAGNOSIS — G8929 Other chronic pain: Secondary | ICD-10-CM | POA: Diagnosis present

## 2015-01-10 DIAGNOSIS — Z91018 Allergy to other foods: Secondary | ICD-10-CM

## 2015-01-10 DIAGNOSIS — I10 Essential (primary) hypertension: Secondary | ICD-10-CM | POA: Diagnosis present

## 2015-01-10 HISTORY — DX: Unspecified viral hepatitis C without hepatic coma: B19.20

## 2015-01-10 HISTORY — DX: Personal history of other medical treatment: Z92.89

## 2015-01-10 HISTORY — DX: Presence of urogenital implants: Z96.0

## 2015-01-10 HISTORY — DX: Presence of other specified devices: Z97.8

## 2015-01-10 HISTORY — DX: Malignant neoplasm of urinary organ, unspecified: C68.9

## 2015-01-10 LAB — CBC WITH DIFFERENTIAL/PLATELET
Basophils Absolute: 0 10*3/uL (ref 0.0–0.1)
Basophils Relative: 1 % (ref 0–1)
EOS PCT: 0 % (ref 0–5)
Eosinophils Absolute: 0 10*3/uL (ref 0.0–0.7)
HCT: 19.7 % — ABNORMAL LOW (ref 39.0–52.0)
HEMOGLOBIN: 6.7 g/dL — AB (ref 13.0–17.0)
LYMPHS ABS: 0.6 10*3/uL — AB (ref 0.7–4.0)
Lymphocytes Relative: 52 % — ABNORMAL HIGH (ref 12–46)
MCH: 25.6 pg — ABNORMAL LOW (ref 26.0–34.0)
MCHC: 34 g/dL (ref 30.0–36.0)
MCV: 75.2 fL — ABNORMAL LOW (ref 78.0–100.0)
Monocytes Absolute: 0.1 10*3/uL (ref 0.1–1.0)
Monocytes Relative: 9 % (ref 3–12)
Neutro Abs: 0.4 10*3/uL — ABNORMAL LOW (ref 1.7–7.7)
Neutrophils Relative %: 38 % — ABNORMAL LOW (ref 43–77)
Platelets: 17 10*3/uL — CL (ref 150–400)
RBC: 2.62 MIL/uL — ABNORMAL LOW (ref 4.22–5.81)
RDW: 15.3 % (ref 11.5–15.5)
WBC: 1.1 10*3/uL — AB (ref 4.0–10.5)

## 2015-01-10 LAB — COMPREHENSIVE METABOLIC PANEL
ALBUMIN: 2.6 g/dL — AB (ref 3.5–5.0)
ALK PHOS: 85 U/L (ref 38–126)
ALT: 20 U/L (ref 17–63)
ANION GAP: 10 (ref 5–15)
AST: 30 U/L (ref 15–41)
BUN: 22 mg/dL — AB (ref 6–20)
CHLORIDE: 103 mmol/L (ref 101–111)
CO2: 21 mmol/L — AB (ref 22–32)
Calcium: 8.4 mg/dL — ABNORMAL LOW (ref 8.9–10.3)
Creatinine, Ser: 1.2 mg/dL (ref 0.61–1.24)
GFR calc Af Amer: >60 mL/min (ref >60)
GFR calc non Af Amer: >60 mL/min (ref >60)
GLUCOSE: 91 mg/dL (ref 65–99)
POTASSIUM: 3.9 mmol/L (ref 3.5–5.1)
Sodium: 134 mmol/L — ABNORMAL LOW (ref 135–145)
Total Bilirubin: 0.5 mg/dL (ref 0.3–1.2)
Total Protein: 6.2 g/dL — ABNORMAL LOW (ref 6.5–8.1)

## 2015-01-10 LAB — URINALYSIS, ROUTINE W REFLEX MICROSCOPIC
Glucose, UA: NEGATIVE mg/dL
KETONES UR: 40 mg/dL — AB
NITRITE: POSITIVE — AB
Protein, ur: >300 mg/dL — AB
Specific Gravity, Urine: 1.012 (ref 1.005–1.030)
Urobilinogen, UA: 0.2 mg/dL (ref 0.0–1.0)
pH: 6 (ref 5.0–8.0)

## 2015-01-10 LAB — URINE MICROSCOPIC-ADD ON

## 2015-01-10 LAB — PREPARE RBC (CROSSMATCH)

## 2015-01-10 MED ORDER — OXYCODONE HCL 5 MG PO TABS
5.0000 mg | ORAL_TABLET | Freq: Once | ORAL | Status: AC
  Administered 2015-01-10: 5 mg via ORAL
  Filled 2015-01-10: qty 1

## 2015-01-10 MED ORDER — SODIUM CHLORIDE 0.9 % IV SOLN
10.0000 mL/h | Freq: Once | INTRAVENOUS | Status: AC
  Administered 2015-01-10: 10 mL/h via INTRAVENOUS

## 2015-01-10 MED ORDER — OXYCODONE HCL ER 15 MG PO T12A: EXTENDED_RELEASE_TABLET | ORAL | Status: DC

## 2015-01-10 MED ORDER — CALCIUM CARBONATE ANTACID 500 MG PO CHEW
1.0000 | CHEWABLE_TABLET | Freq: Once | ORAL | Status: AC
  Administered 2015-01-11: 200 mg via ORAL
  Filled 2015-01-10: qty 1

## 2015-01-10 NOTE — Progress Notes (Signed)
Report taken from Andee Poles, [place] in ED.

## 2015-01-10 NOTE — Telephone Encounter (Signed)
[facility] 

## 2015-01-10 NOTE — ED Notes (Signed)
Pt EMS: pt from [facility] for eval of hgb of 6.8, facility sent pt for blood transfusion and admission. Pt pale upon arrival, denies any sob or weakness at this time. Pt reports chronic pain to left back. nad noted.

## 2015-01-10 NOTE — Progress Notes (Signed)
Patient requesting tums. Dr. Hal Hope notifed. Given telephone readback order for one time dose 1 tablet of tums.
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
## 2015-01-11 ENCOUNTER — Inpatient Hospital Stay (HOSPITAL_COMMUNITY): Admission: RE | Admit: 2015-01-11 | Payer: Self-pay | Source: Ambulatory Visit

## 2015-01-11 DIAGNOSIS — G40909 Epilepsy, unspecified, not intractable, without status epilepticus: Secondary | ICD-10-CM | POA: Diagnosis present

## 2015-01-11 DIAGNOSIS — Z91018 Allergy to other foods: Secondary | ICD-10-CM | POA: Diagnosis not present

## 2015-01-11 DIAGNOSIS — D61818 Other pancytopenia: Secondary | ICD-10-CM | POA: Diagnosis not present

## 2015-01-11 DIAGNOSIS — Z9049 Acquired absence of other specified parts of digestive tract: Secondary | ICD-10-CM | POA: Diagnosis present

## 2015-01-11 DIAGNOSIS — Z87440 Personal history of urinary (tract) infections: Secondary | ICD-10-CM | POA: Diagnosis not present

## 2015-01-11 DIAGNOSIS — D62 Acute posthemorrhagic anemia: Secondary | ICD-10-CM | POA: Diagnosis not present

## 2015-01-11 DIAGNOSIS — Z87891 Personal history of nicotine dependence: Secondary | ICD-10-CM | POA: Diagnosis not present

## 2015-01-11 DIAGNOSIS — D649 Anemia, unspecified: Secondary | ICD-10-CM

## 2015-01-11 DIAGNOSIS — N390 Urinary tract infection, site not specified: Secondary | ICD-10-CM | POA: Diagnosis not present

## 2015-01-11 DIAGNOSIS — T451X5A Adverse effect of antineoplastic and immunosuppressive drugs, initial encounter: Secondary | ICD-10-CM | POA: Diagnosis present

## 2015-01-11 DIAGNOSIS — R319 Hematuria, unspecified: Secondary | ICD-10-CM

## 2015-01-11 DIAGNOSIS — C688 Malignant neoplasm of overlapping sites of urinary organs: Secondary | ICD-10-CM | POA: Diagnosis not present

## 2015-01-11 DIAGNOSIS — F319 Bipolar disorder, unspecified: Secondary | ICD-10-CM | POA: Diagnosis present

## 2015-01-11 DIAGNOSIS — C679 Malignant neoplasm of bladder, unspecified: Secondary | ICD-10-CM | POA: Diagnosis present

## 2015-01-11 DIAGNOSIS — G8220 Paraplegia, unspecified: Secondary | ICD-10-CM | POA: Diagnosis present

## 2015-01-11 DIAGNOSIS — Z7901 Long term (current) use of anticoagulants: Secondary | ICD-10-CM | POA: Diagnosis not present

## 2015-01-11 DIAGNOSIS — G8929 Other chronic pain: Secondary | ICD-10-CM | POA: Diagnosis present

## 2015-01-11 DIAGNOSIS — N319 Neuromuscular dysfunction of bladder, unspecified: Secondary | ICD-10-CM | POA: Diagnosis present

## 2015-01-11 DIAGNOSIS — Z86711 Personal history of pulmonary embolism: Secondary | ICD-10-CM | POA: Diagnosis not present

## 2015-01-11 DIAGNOSIS — R310 Gross hematuria: Secondary | ICD-10-CM | POA: Diagnosis present

## 2015-01-11 DIAGNOSIS — B1920 Unspecified viral hepatitis C without hepatic coma: Secondary | ICD-10-CM | POA: Diagnosis present

## 2015-01-11 DIAGNOSIS — D61810 Antineoplastic chemotherapy induced pancytopenia: Secondary | ICD-10-CM | POA: Diagnosis present

## 2015-01-11 DIAGNOSIS — E785 Hyperlipidemia, unspecified: Secondary | ICD-10-CM | POA: Diagnosis present

## 2015-01-11 DIAGNOSIS — I10 Essential (primary) hypertension: Secondary | ICD-10-CM | POA: Diagnosis present

## 2015-01-11 DIAGNOSIS — N3091 Cystitis, unspecified with hematuria: Secondary | ICD-10-CM | POA: Diagnosis present

## 2015-01-11 DIAGNOSIS — D630 Anemia in neoplastic disease: Secondary | ICD-10-CM | POA: Diagnosis present

## 2015-01-11 DIAGNOSIS — K219 Gastro-esophageal reflux disease without esophagitis: Secondary | ICD-10-CM | POA: Diagnosis present

## 2015-01-11 DIAGNOSIS — Z89612 Acquired absence of left leg above knee: Secondary | ICD-10-CM | POA: Diagnosis not present

## 2015-01-11 LAB — CBC WITH DIFFERENTIAL/PLATELET
BASOS PCT: 1 % (ref 0–1)
Basophils Absolute: 0 10*3/uL (ref 0.0–0.1)
Basophils Absolute: 0 10*3/uL (ref 0.0–0.1)
Basophils Relative: 1 % (ref 0–1)
EOS ABS: 0 10*3/uL (ref 0.0–0.7)
EOS PCT: 1 % (ref 0–5)
Eosinophils Absolute: 0 10*3/uL (ref 0.0–0.7)
Eosinophils Relative: 0 % (ref 0–5)
HCT: 26.9 % — ABNORMAL LOW (ref 39.0–52.0)
HCT: 27.7 % — ABNORMAL LOW (ref 39.0–52.0)
Hemoglobin: 9.1 g/dL — ABNORMAL LOW (ref 13.0–17.0)
Hemoglobin: 9.5 g/dL — ABNORMAL LOW (ref 13.0–17.0)
LYMPHS ABS: 0.6 10*3/uL — AB (ref 0.7–4.0)
LYMPHS PCT: 48 % — AB (ref 12–46)
Lymphocytes Relative: 37 % (ref 12–46)
Lymphs Abs: 0.7 10*3/uL (ref 0.7–4.0)
MCH: 25.7 pg — ABNORMAL LOW (ref 26.0–34.0)
MCH: 26.2 pg (ref 26.0–34.0)
MCHC: 33.8 g/dL (ref 30.0–36.0)
MCHC: 34.3 g/dL (ref 30.0–36.0)
MCV: 76 fL — ABNORMAL LOW (ref 78.0–100.0)
MCV: 76.3 fL — ABNORMAL LOW (ref 78.0–100.0)
MONOS PCT: 14 % — AB (ref 3–12)
MONOS PCT: 17 % — AB (ref 3–12)
Monocytes Absolute: 0.2 10*3/uL (ref 0.1–1.0)
Monocytes Absolute: 0.3 10*3/uL (ref 0.1–1.0)
NEUTROS ABS: 0.6 10*3/uL — AB (ref 1.7–7.7)
Neutro Abs: 0.7 10*3/uL — ABNORMAL LOW (ref 1.7–7.7)
Neutrophils Relative %: 37 % — ABNORMAL LOW (ref 43–77)
Neutrophils Relative %: 46 % (ref 43–77)
Platelets: 15 10*3/uL — CL (ref 150–400)
Platelets: 48 10*3/uL — ABNORMAL LOW (ref 150–400)
RBC: 3.54 MIL/uL — AB (ref 4.22–5.81)
RBC: 3.63 MIL/uL — AB (ref 4.22–5.81)
RDW: 15.6 % — ABNORMAL HIGH (ref 11.5–15.5)
RDW: 15.8 % — ABNORMAL HIGH (ref 11.5–15.5)
WBC: 1.5 10*3/uL — AB (ref 4.0–10.5)
WBC: 1.6 10*3/uL — ABNORMAL LOW (ref 4.0–10.5)

## 2015-01-11 LAB — COMPREHENSIVE METABOLIC PANEL
ALBUMIN: 2.6 g/dL — AB (ref 3.5–5.0)
ALT: 20 U/L (ref 17–63)
AST: 31 U/L (ref 15–41)
Alkaline Phosphatase: 85 U/L (ref 38–126)
Anion gap: 9 (ref 5–15)
BUN: 24 mg/dL — AB (ref 6–20)
CHLORIDE: 106 mmol/L (ref 101–111)
CO2: 21 mmol/L — ABNORMAL LOW (ref 22–32)
Calcium: 8.5 mg/dL — ABNORMAL LOW (ref 8.9–10.3)
Creatinine, Ser: 1.32 mg/dL — ABNORMAL HIGH (ref 0.61–1.24)
GFR calc Af Amer: >60 mL/min (ref >60)
GFR calc non Af Amer: 58 mL/min — ABNORMAL LOW (ref >60)
Glucose, Bld: 95 mg/dL (ref 65–99)
Potassium: 4.3 mmol/L (ref 3.5–5.1)
Sodium: 136 mmol/L (ref 135–145)
Total Bilirubin: 0.9 mg/dL (ref 0.3–1.2)
Total Protein: 6.4 g/dL — ABNORMAL LOW (ref 6.5–8.1)

## 2015-01-11 LAB — PROTIME-INR
INR: 1.28 (ref 0.00–1.49)
PROTHROMBIN TIME: 16.1 s — AB (ref 11.6–15.2)

## 2015-01-11 LAB — PATHOLOGIST SMEAR REVIEW

## 2015-01-11 LAB — MRSA PCR SCREENING: MRSA BY PCR: POSITIVE — AB

## 2015-01-11 MED ORDER — POLYVINYL ALCOHOL 1.4 % OP SOLN
2.0000 [drp] | Freq: Three times a day (TID) | OPHTHALMIC | Status: DC
  Administered 2015-01-11 – 2015-01-13 (×7): 2 [drp] via OPHTHALMIC
  Filled 2015-01-11: qty 15

## 2015-01-11 MED ORDER — PANTOPRAZOLE SODIUM 40 MG PO TBEC
40.0000 mg | DELAYED_RELEASE_TABLET | Freq: Every day | ORAL | Status: DC
  Administered 2015-01-11 – 2015-01-13 (×3): 40 mg via ORAL
  Filled 2015-01-11 (×4): qty 1

## 2015-01-11 MED ORDER — ONDANSETRON HCL 4 MG PO TABS: 4.0000 mg | ORAL_TABLET | Freq: Three times a day (TID) | ORAL | Status: DC | PRN

## 2015-01-11 MED ORDER — GUAIFENESIN 100 MG/5ML PO SOLN
300.0000 mg | Freq: Three times a day (TID) | ORAL | Status: DC | PRN
Start: 2015-01-11 — End: 2015-01-13
  Filled 2015-01-11: qty 15

## 2015-01-11 MED ORDER — FAMOTIDINE 20 MG PO TABS
20.0000 mg | ORAL_TABLET | Freq: Every day | ORAL | Status: DC
  Administered 2015-01-11 – 2015-01-12 (×3): 20 mg via ORAL
  Filled 2015-01-11 (×4): qty 1

## 2015-01-11 MED ORDER — PHYTONADIONE 5 MG PO TABS: 5.0000 mg | ORAL_TABLET | Freq: Once | ORAL | Status: DC

## 2015-01-11 MED ORDER — SODIUM CHLORIDE 0.9 % IV SOLN
Freq: Once | INTRAVENOUS | Status: DC
Start: 2015-01-11 — End: 2015-01-13

## 2015-01-11 MED ORDER — FOLIC ACID 1 MG PO TABS
1.0000 mg | ORAL_TABLET | Freq: Every day | ORAL | Status: DC
  Administered 2015-01-11 – 2015-01-13 (×3): 1 mg via ORAL
  Filled 2015-01-11 (×3): qty 1

## 2015-01-11 MED ORDER — LAMOTRIGINE 200 MG PO TABS
200.0000 mg | ORAL_TABLET | Freq: Two times a day (BID) | ORAL | Status: DC
  Administered 2015-01-11 – 2015-01-13 (×6): 200 mg via ORAL
  Filled 2015-01-11 (×7): qty 1

## 2015-01-11 MED ORDER — ONDANSETRON HCL 4 MG/2ML IJ SOLN
4.0000 mg | Freq: Four times a day (QID) | INTRAMUSCULAR | Status: DC | PRN
Start: 2015-01-11 — End: 2015-01-13
  Administered 2015-01-11: 4 mg via INTRAVENOUS
  Filled 2015-01-11: qty 2

## 2015-01-11 MED ORDER — METOCLOPRAMIDE HCL 5 MG PO TABS
5.0000 mg | ORAL_TABLET | Freq: Three times a day (TID) | ORAL | Status: DC
  Administered 2015-01-11 – 2015-01-13 (×7): 5 mg via ORAL
  Filled 2015-01-11 (×13): qty 1

## 2015-01-11 MED ORDER — FERROUS SULFATE 325 (65 FE) MG PO TABS
325.0000 mg | ORAL_TABLET | Freq: Two times a day (BID) | ORAL | Status: DC
  Administered 2015-01-11 – 2015-01-13 (×5): 325 mg via ORAL
  Filled 2015-01-11 (×7): qty 1

## 2015-01-11 MED ORDER — CHLORHEXIDINE GLUCONATE CLOTH 2 % EX PADS
6.0000 | MEDICATED_PAD | Freq: Every day | CUTANEOUS | Status: DC
Start: 2015-01-11 — End: 2015-01-13
  Administered 2015-01-11 – 2015-01-12 (×2): 6 via TOPICAL

## 2015-01-11 MED ORDER — NORMAL SALINE FLUSH 0.9 % IV SOLN: 10.0000 mL | INTRAVENOUS | Status: DC

## 2015-01-11 MED ORDER — ERTAPENEM SODIUM 1 G IJ SOLR: 1.0000 g | INTRAMUSCULAR | Status: DC

## 2015-01-11 MED ORDER — PREGABALIN 100 MG PO CAPS
100.0000 mg | ORAL_CAPSULE | Freq: Three times a day (TID) | ORAL | Status: DC
  Administered 2015-01-11 – 2015-01-13 (×8): 100 mg via ORAL
  Filled 2015-01-11 (×9): qty 1

## 2015-01-11 MED ORDER — POTASSIUM CHLORIDE CRYS ER 10 MEQ PO TBCR
10.0000 meq | EXTENDED_RELEASE_TABLET | Freq: Two times a day (BID) | ORAL | Status: DC
  Administered 2015-01-11 – 2015-01-13 (×6): 10 meq via ORAL
  Filled 2015-01-11 (×7): qty 1

## 2015-01-11 MED ORDER — ONDANSETRON HCL 4 MG PO TABS
4.0000 mg | ORAL_TABLET | Freq: Four times a day (QID) | ORAL | Status: DC | PRN
  Administered 2015-01-11: 4 mg via ORAL
  Filled 2015-01-11: qty 1

## 2015-01-11 MED ORDER — WARFARIN SODIUM 3 MG PO TABS
3.0000 mg | ORAL_TABLET | Freq: Once | ORAL | Status: AC
  Administered 2015-01-11: 3 mg via ORAL
  Filled 2015-01-11: qty 1

## 2015-01-11 MED ORDER — SENNA 8.6 MG PO TABS
2.0000 | ORAL_TABLET | Freq: Every morning | ORAL | Status: DC
  Administered 2015-01-11 – 2015-01-13 (×3): 17.2 mg via ORAL
  Filled 2015-01-11 (×3): qty 2

## 2015-01-11 MED ORDER — SODIUM CHLORIDE 0.9 % IV SOLN
1.0000 g | INTRAVENOUS | Status: DC
  Administered 2015-01-11 – 2015-01-13 (×3): 1 g via INTRAVENOUS
  Filled 2015-01-11 (×4): qty 1

## 2015-01-11 MED ORDER — VITAMIN B-12 100 MCG PO TABS
100.0000 ug | ORAL_TABLET | ORAL | Status: DC
  Administered 2015-01-11 – 2015-01-13 (×2): 100 ug via ORAL
  Filled 2015-01-11 (×2): qty 1

## 2015-01-11 MED ORDER — ATORVASTATIN CALCIUM 10 MG PO TABS
10.0000 mg | ORAL_TABLET | Freq: Every day | ORAL | Status: DC
  Administered 2015-01-11 – 2015-01-12 (×2): 10 mg via ORAL
  Filled 2015-01-11 (×3): qty 1

## 2015-01-11 MED ORDER — METOPROLOL TARTRATE 25 MG PO TABS
25.0000 mg | ORAL_TABLET | Freq: Two times a day (BID) | ORAL | Status: DC
  Administered 2015-01-11 – 2015-01-13 (×6): 25 mg via ORAL
  Filled 2015-01-11 (×7): qty 1

## 2015-01-11 MED ORDER — NITROGLYCERIN 0.4 MG SL SUBL: 0.4000 mg | SUBLINGUAL_TABLET | SUBLINGUAL | Status: DC | PRN

## 2015-01-11 MED ORDER — WARFARIN - PHARMACIST DOSING INPATIENT: Freq: Every day | Status: DC

## 2015-01-11 MED ORDER — MUPIROCIN 2 % EX OINT
1.0000 | TOPICAL_OINTMENT | Freq: Two times a day (BID) | CUTANEOUS | Status: DC
Start: 2015-01-11 — End: 2015-01-13
  Administered 2015-01-11 – 2015-01-13 (×4): 1 via NASAL
  Filled 2015-01-11 (×2): qty 22

## 2015-01-11 MED ORDER — OXYBUTYNIN CHLORIDE ER 5 MG PO TB24
5.0000 mg | ORAL_TABLET | Freq: Every morning | ORAL | Status: DC
  Administered 2015-01-11 – 2015-01-13 (×3): 5 mg via ORAL
  Filled 2015-01-11 (×3): qty 1

## 2015-01-11 MED ORDER — QUETIAPINE FUMARATE 300 MG PO TABS
300.0000 mg | ORAL_TABLET | Freq: Two times a day (BID) | ORAL | Status: DC
  Administered 2015-01-11 – 2015-01-13 (×6): 300 mg via ORAL
  Filled 2015-01-11 (×7): qty 1

## 2015-01-11 MED ORDER — LIDOCAINE 5 % EX PTCH
2.0000 | MEDICATED_PATCH | Freq: Every day | CUTANEOUS | Status: DC | PRN
  Filled 2015-01-11: qty 2

## 2015-01-11 MED ORDER — DOCUSATE SODIUM 100 MG PO CAPS
100.0000 mg | ORAL_CAPSULE | Freq: Two times a day (BID) | ORAL | Status: DC
  Administered 2015-01-11 – 2015-01-13 (×5): 100 mg via ORAL
  Filled 2015-01-11 (×7): qty 1

## 2015-01-11 MED ORDER — AMITRIPTYLINE HCL 75 MG PO TABS
75.0000 mg | ORAL_TABLET | Freq: Every day | ORAL | Status: DC
  Administered 2015-01-11 – 2015-01-12 (×3): 75 mg via ORAL
  Filled 2015-01-11 (×4): qty 1

## 2015-01-11 MED ORDER — PROMETHAZINE HCL 25 MG PO TABS: 12.5000 mg | ORAL_TABLET | Freq: Four times a day (QID) | ORAL | Status: DC | PRN

## 2015-01-11 MED ORDER — ACETAMINOPHEN 325 MG PO TABS
650.0000 mg | ORAL_TABLET | Freq: Four times a day (QID) | ORAL | Status: DC | PRN
  Administered 2015-01-11: 650 mg via ORAL
  Filled 2015-01-11: qty 2

## 2015-01-11 MED ORDER — OXYCODONE HCL 5 MG PO TABS
5.0000 mg | ORAL_TABLET | ORAL | Status: DC | PRN
  Administered 2015-01-11 – 2015-01-13 (×7): 5 mg via ORAL
  Filled 2015-01-11 (×8): qty 1

## 2015-01-11 MED ORDER — ACETAMINOPHEN 650 MG RE SUPP: 650.0000 mg | Freq: Four times a day (QID) | RECTAL | Status: DC | PRN

## 2015-01-11 MED ORDER — BOOST PO LIQD
237.0000 mL | Freq: Two times a day (BID) | ORAL | Status: DC
  Filled 2015-01-11 (×8): qty 237

## 2015-01-11 MED ORDER — CLONAZEPAM 0.5 MG PO TABS
0.2500 mg | ORAL_TABLET | Freq: Four times a day (QID) | ORAL | Status: DC | PRN
  Administered 2015-01-11: 0.5 mg via ORAL
  Filled 2015-01-11 (×2): qty 1

## 2015-01-11 MED ORDER — SODIUM CHLORIDE 0.9 % IV SOLN
INTRAVENOUS | Status: DC
Start: 2015-01-11 — End: 2015-01-13

## 2015-01-11 MED ORDER — LACTULOSE 10 GM/15ML PO SOLN
20.0000 g | Freq: Three times a day (TID) | ORAL | Status: DC
  Filled 2015-01-11 (×13): qty 30

## 2015-01-11 NOTE — Progress Notes (Signed)
Phlebotomist at patient bedside.

## 2015-01-11 NOTE — Progress Notes (Signed)
Patient arrived to unit via stretcher. Pt alert and oriented x 4 . Port accessed with first unit of blood transfusing. Skin intact. Foley catheter leaking, per patient this began recently. Call bell within reach, bed locked and in lowest position. Refuses bed alarm at this time. Pt states that he will call nursing staff when assist needed. Placed on tele box #2. [facility] admissions paged -  Awaiting MD orders.

## 2015-01-11 NOTE — Progress Notes (Signed)
Utilization review completed.  
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
## 2015-01-11 NOTE — Progress Notes (Signed)
[facility] PROGRESS NOTE  Samuel [name] URK:[id number] DOB: 1956-06-04 DOA: 01/10/2015 PCP: [name], MD  Assessment/Plan: 1. Urothelial carcinoma: Recently began palliative chemotherapy with gemcitabine and carboplatin.  1. Per oncology, likely to hold off on further therapy until fully recovered. 2. Acute blood loss anemia in setting of chemotherapy-induced pancytopenia: due to gross hematuria from hemorrhagic cystitis due to active bladder carcinoma +/- chemotherapy. We will monitor hematuria and hemoglobin. Continue iron supplement.  3. History of PE: Continuing coumadin. Subtherapeutic INR today, being managed per pharmacy while inpatient. Will likely need IVC filter in lieu of anticoagulation if active bleeding continues. He has had this before. 4. Pancytopenia due to bone marrow suppression: No growth factor support per oncology.  1. Thrombocytopenia: Would transfuse prophylactically for plt < 10,000. Active bleeding or any signs of SIRS would prompt transfusion as well.  2. Neutropenia: Afebrile. Neutropenic precautions. 3. Anemia: as above. 5. Recurrent ESBL UTI: Continuing outpatient treatment with ertapenem 6/10 - 6/19.  6. History of bipolar disorder: Describes mood as "melancholy."  1. Continue home medications: seroquel, clonazepam, and elavil.  1. Monitor closely for serotonin syndrome, also taking reglan at home.  7. Paraplegia: no intervention planned 8. HTN: At goal, continue home medications. No diuretics.  9. GERD: continue home meds  10. Chronic pain: continue oxycontin 15mg  qHS, oxyIR 4mg  q4h prn, lyrica 100mg  TID  Code Status: Full Family Communication: Spoke with patient. He reported that he would inform his 2 daughters of all developments.  Disposition Plan: Possibly back to [facility] tomorrow if hgb stable/hematuria resolves, possibly needs IR consult for IVC filter placement if hematuria continues   Consultants:  Dr. Alen Blew,  oncology  Procedures:  None  Antibiotics:  Invanz begun as outpatient (6/10 - 6/19 end date)  HPI/Subjective: 59 yo male sent from urology office for anemia in the setting of recent gross hematuria and recent inception of chemotherapy for urothelial carcinoma. Anemia improved with transfusions of 2u PRBCs. He has felt melancholy but denies fatigue, dyspnea, palpitations, chest pain.    Objective: Filed Vitals:   01/11/15 0930  BP: 110/72  Pulse: 76  Temp: 97.8 F (36.6 C)  Resp: 21    Intake/Output Summary (Last 24 hours) at 01/11/15 1141 Last data filed at 01/11/15 0931  Gross per 24 hour  Intake 1900.83 ml  Output      0 ml  Net 1900.83 ml   Filed Weights   01/10/15 1736  Weight: 240 lb (108.863 kg)    Exam:   General:  Obese pallorous male, sleeping soundly.   HEENT: Marked conjunctival pallor.  Cardiovascular: Regular rate, no murmur.   Respiratory: Distant but clear breath sounds. No distress.  Abdomen: Obese  GU: Foley in place without surrounding irritation  Musculoskeletal: Left leg stump without abnormality.    Skin: No petechiae or ecchymoses noted  Psych: Melancholic mood, depressed demeanor, congruent affect.   Data Reviewed: Basic Metabolic Panel:  Recent Labs Lab 01/10/15 1828 01/11/15 0650  NA 134* 136  K 3.9 4.3  CL 103 106  CO2 21* 21*  GLUCOSE 91 95  BUN 22* 24*  CREATININE 1.20 1.32*  CALCIUM 8.4* 8.5*   Liver Function Tests:  Recent Labs Lab 01/10/15 1828 01/11/15 0650  AST 30 31  ALT 20 20  ALKPHOS 85 85  BILITOT 0.5 0.9  PROT 6.2* 6.4*  ALBUMIN 2.6* 2.6*   No results for input(s): LIPASE, AMYLASE in the last 168 hours. No results for input(s): AMMONIA in the last 168  hours. CBC:  Recent Labs Lab 01/10/15 1828 01/11/15 0650  WBC 1.1* 1.5*  NEUTROABS 0.4* 0.6*  HGB 6.7* 9.1*  HCT 19.7* 26.9*  MCV 75.2* 76.0*  PLT 17* 15*   Cardiac Enzymes: No results for input(s): CKTOTAL, CKMB, CKMBINDEX,  TROPONINI in the last 168 hours. BNP (last 3 results)  Recent Labs  09/17/14 1651  BNP 41.0    ProBNP (last 3 results) No results for input(s): PROBNP in the last 8760 hours.  CBG: No results for input(s): GLUCAP in the last 168 hours.  Recent Results (from the past 240 hour(s))  MRSA PCR Screening     Status: Abnormal   Collection Time: 01/10/15 10:27 PM  Result Value Ref Range Status   MRSA by PCR POSITIVE (A) NEGATIVE Final    Comment:        The GeneXpert MRSA Assay (FDA approved for NASAL specimens only), is one component of a comprehensive MRSA colonization surveillance program. It is not intended to diagnose MRSA infection nor to guide or monitor treatment for MRSA infections. RESULT CALLED TO, READ BACK BY AND VERIFIED WITH: D.THOMPSON RN [id number] 01/11/15 E.GADDY      Studies: No results found.  Scheduled Meds: . sodium chloride   Intravenous Once  . amitriptyline  75 mg Oral QHS  . atorvastatin  10 mg Oral q1800  . Chlorhexidine Gluconate Cloth  6 each Topical Q0600  . docusate sodium  100 mg Oral BID  . ertapenem  1 g Intravenous Q24H  . famotidine  20 mg Oral QHS  . ferrous sulfate  325 mg Oral BID WC  . folic acid  1 mg Oral Daily  . lactose free nutrition  237 mL Oral BID BM  . lactulose  20 g Oral TID AC & HS  . lamoTRIgine  200 mg Oral BID  . metoCLOPramide  5 mg Oral TID AC & HS  . metoprolol tartrate  25 mg Oral BID  . mupirocin ointment  1 application Nasal BID  . oxybutynin  5 mg Oral q morning - 10a  . pantoprazole  40 mg Oral Daily  . polyvinyl alcohol  2 drop Both Eyes TID  . potassium chloride  10 mEq Oral BID  . pregabalin  100 mg Oral TID  . QUEtiapine  300 mg Oral BID  . senna  2 tablet Oral q morning - 10a  . cyanocobalamin  100 mcg Oral QODAY  . warfarin  3 mg Oral ONCE-1800  . Warfarin - Pharmacist Dosing Inpatient   Does not apply q1800   Continuous Infusions: . sodium chloride      Active Problems:   Acute blood loss  anemia   Urothelial cancer   Anemia  [name]  FM resident with [facility] Pager [phone]. If 7PM-7AM, please contact night-coverage at [email or web address], password [email or web address] 01/11/2015, 11:41 AM  LOS: 0 days

## 2015-01-11 NOTE — Care Management Note (Signed)
Case Management Note  Patient Details  Name: Samuel [name] MRN: [id number] Date of Birth: 1955/10/19  Subjective/Objective:  Patient is from [facility], CSW referral., PT/OT eval ordered.                  Action/Plan:   Expected Discharge Date:                  Expected Discharge Plan:  Skilled Nursing Facility  In-House Referral:  Clinical Social Work  Discharge planning Services  CM Consult  Post Acute Care Choice:    Choice offered to:     DME Arranged:    DME Agency:     HH Arranged:    [place] Agency:     Status of Service:  In process, will continue to follow  Medicare Important Message Given:  N/A - LOS <3 / Initial given by admissions Date Medicare IM Given:    Medicare IM give by:    Date Additional Medicare IM Given:    Additional Medicare Important Message give by:     If discussed at [place] of Stay Meetings, dates discussed:    Additional Comments:  Zenon Mayo, RN 01/11/2015, 5:12 PM
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
## 2015-01-11 NOTE — Plan of Care (Signed)
Problem: Phase I Progression Outcomes Goal: OOB as tolerated unless otherwise ordered Outcome: Progressing Pt has a Left AKA

## 2015-01-11 NOTE — Progress Notes (Signed)
IP PROGRESS NOTE  Subjective:   59 year old gentleman known to me with advanced bladder cancer. He is receiving systemic chemotherapy last of which on 01/03/2015. He has been receiving gemcitabine and carboplatin and a reduced dose but presented with pancytopenia and hematuria on 01/10/2015. He was found to have hemoglobin of 6.7 and a platelet count of 17. His white cell count was 1.1. He did not have any element of sepsis. He was transfused packed red cells and receiving platelet transfusion as well.  This morning he is feeling better and reports no complaints. She still have some hematuria noted at this time. No fevers or chills. No epistaxis or hematochezia.  Objective:  Vital signs in last 24 hours: Temp:  [97.4 F (36.3 C)-99.4 F (37.4 C)] 97.7 F (36.5 C) (06/16 0759) Pulse Rate:  [75-102] 75 (06/16 0759) Resp:  [16-27] 22 (06/16 0759) BP: (94-139)/(55-88) 94/77 mmHg (06/16 0759) SpO2:  [95 %-100 %] 96 % (06/16 0759) Weight:  [240 lb (108.863 kg)] 240 lb (108.863 kg) (06/15 1736) Weight change:     Intake/Output from previous day: 06/15 0701 - 06/16 0700 In: 1448.8 [P.O.:666; I.V.:10; Blood:772.8] Out: -   Mouth: mucous membranes moist, pharynx normal without lesions Resp: clear to auscultation bilaterally Cardio: regular rate and rhythm, S1, S2 normal, no murmur, click, rub or gallop GI: soft, non-tender; bowel sounds normal; no masses,  no organomegaly Extremities: extremities normal, atraumatic, no cyanosis or edema  Portacath/PICC-without erythema  Lab Results:  Recent Labs  01/10/15 1828 01/11/15 0650  WBC 1.1* 1.5*  HGB 6.7* 9.1*  HCT 19.7* 26.9*  PLT 17* PENDING    BMET  Recent Labs  01/10/15 1828 01/11/15 0650  NA 134* 136  K 3.9 4.3  CL 103 106  CO2 21* 21*  GLUCOSE 91 95  BUN 22* 24*  CREATININE 1.20 1.32*  CALCIUM 8.4* 8.5*    Studies/Results: No results found.  Medications: I have reviewed the patient's current  medications.  Assessment/Plan:  59 year old gentleman with the following issues:  1. Advanced urothelial carcinoma of the bladder with pelvic adenopathy. He is currently receiving carboplatin and gemcitabine salvage therapy. His therapy been complicated with recurrent pancytopenia. I will hold off on any further therapy at this time to he is fully recovered. My consider switching his regimen in the near future.  2. Pancytopenia: Related to systemic chemotherapy. His white cell count is improving slowly and I see no element of sepsis. He does not need any growth factor support. His hemoglobin has improved to 9.1 after transfusion and I do not see any further need for transfusions. His blood counts currently pending but I anticipate will be improved as well.  3. Hematuria: He is chronically anticoagulated given his history of pulmonary embolism. I have recommended continuing his anticoagulation at this time given the risk of thrombosis outweighs the potential complication of bleeding. If his bleeding becomes problematic, we can consider placing a IVC filter at this time. He had an IVC filter place in the past and has been removed.  I agree with the current management as you're doing please contact me with any questions regarding any of these issues.   LOS: 0 days   [id number] 01/11/2015, 8:22 AM

## 2015-01-11 NOTE — Progress Notes (Signed)
Dr. Hal Hope paged as post blood CBC has not yet been drawn by phlebotomy and MD has placed order to transfuse 1 unit of platelets. Per telephone readback order, have CBC w/ differential and INR STAT drawn prior to giving patient platelets. Lab notified of order. Will continue to monitor patient.

## 2015-01-11 NOTE — Progress Notes (Signed)
MRSA PCR positive. Standing orders placed.

## 2015-01-12 ENCOUNTER — Encounter (HOSPITAL_COMMUNITY): Payer: Medicare Other

## 2015-01-12 DIAGNOSIS — D61818 Other pancytopenia: Secondary | ICD-10-CM

## 2015-01-12 DIAGNOSIS — C688 Malignant neoplasm of overlapping sites of urinary organs: Secondary | ICD-10-CM

## 2015-01-12 DIAGNOSIS — D62 Acute posthemorrhagic anemia: Secondary | ICD-10-CM

## 2015-01-12 LAB — PREPARE FRESH FROZEN PLASMA
UNIT DIVISION: 0
Unit division: 0

## 2015-01-12 LAB — TYPE AND SCREEN
ABO/RH(D): O POS
ANTIBODY SCREEN: NEGATIVE
Unit division: 0
Unit division: 0

## 2015-01-12 LAB — CBC WITH DIFFERENTIAL/PLATELET
BASOS PCT: 0 % (ref 0–1)
Basophils Absolute: 0 10*3/uL (ref 0.0–0.1)
EOS ABS: 0 10*3/uL (ref 0.0–0.7)
EOS PCT: 1 % (ref 0–5)
HCT: 24.5 % — ABNORMAL LOW (ref 39.0–52.0)
HEMOGLOBIN: 8.1 g/dL — AB (ref 13.0–17.0)
LYMPHS PCT: 40 % (ref 12–46)
Lymphs Abs: 0.8 10*3/uL (ref 0.7–4.0)
MCH: 25.3 pg — ABNORMAL LOW (ref 26.0–34.0)
MCHC: 33.1 g/dL (ref 30.0–36.0)
MCV: 76.6 fL — ABNORMAL LOW (ref 78.0–100.0)
Monocytes Absolute: 0.3 10*3/uL (ref 0.1–1.0)
Monocytes Relative: 15 % — ABNORMAL HIGH (ref 3–12)
NEUTROS PCT: 44 % (ref 43–77)
Neutro Abs: 0.8 10*3/uL — ABNORMAL LOW (ref 1.7–7.7)
Platelets: 51 10*3/uL — ABNORMAL LOW (ref 150–400)
RBC: 3.2 MIL/uL — AB (ref 4.22–5.81)
RDW: 15.8 % — ABNORMAL HIGH (ref 11.5–15.5)
WBC: 1.9 10*3/uL — ABNORMAL LOW (ref 4.0–10.5)

## 2015-01-12 LAB — BASIC METABOLIC PANEL
ANION GAP: 7 (ref 5–15)
BUN: 20 mg/dL (ref 6–20)
CHLORIDE: 108 mmol/L (ref 101–111)
CO2: 22 mmol/L (ref 22–32)
Calcium: 8.5 mg/dL — ABNORMAL LOW (ref 8.9–10.3)
Creatinine, Ser: 1.14 mg/dL (ref 0.61–1.24)
Glucose, Bld: 111 mg/dL — ABNORMAL HIGH (ref 65–99)
POTASSIUM: 3.7 mmol/L (ref 3.5–5.1)
Sodium: 137 mmol/L (ref 135–145)

## 2015-01-12 LAB — PREPARE PLATELET PHERESIS: UNIT DIVISION: 0

## 2015-01-12 LAB — PROTIME-INR
INR: 1.27 (ref 0.00–1.49)
Prothrombin Time: 16 seconds — ABNORMAL HIGH (ref 11.6–15.2)

## 2015-01-12 MED ORDER — WARFARIN SODIUM 4 MG PO TABS
4.0000 mg | ORAL_TABLET | Freq: Once | ORAL | Status: DC
  Filled 2015-01-12: qty 1

## 2015-01-12 MED ORDER — WARFARIN SODIUM 3 MG PO TABS
3.0000 mg | ORAL_TABLET | Freq: Once | ORAL | Status: AC
Start: 2015-01-12 — End: 2015-01-12
  Administered 2015-01-12: 3 mg via ORAL
  Filled 2015-01-12: qty 1

## 2015-01-12 MED ORDER — ALUM & MAG HYDROXIDE-SIMETH 200-200-20 MG/5ML PO SUSP: 15.0000 mL | ORAL | Status: DC | PRN

## 2015-01-12 NOTE — Consult Note (Signed)
   [facility] CM Inpatient Consult   01/12/2015  Amias Hutchinson 1956/04/05 [id number]   Referral received. Patient evaluated for [place] Management services. Patient is not eligible for [facility] Care Management services because unfortunately, patient's Medicare is not in the delegation for [place] Management services at this time. Updated inpatient care manager of referral outcome. For questions, please contact: Natividad Brood, RN BSN [place] [facility] Liaison  [phone] business mobile phone

## 2015-01-12 NOTE — Progress Notes (Signed)
ANTICOAGULATION CONSULT NOTE - Follow Up Consult  Pharmacy Consult for Coumadin Indication: History of PE  Allergies  Allergen Reactions  . Tomato Other (See Comments)    Causes acid reflux  Patient Measurements: Height: 5\' 5"  (165.1 cm) Weight: 240 lb (108.863 kg) IBW/kg (Calculated) : 61.5 Vital Signs: Temp: 97.8 F (36.6 C) (06/17 0637) Temp Source: Oral (06/17 0637) BP: 125/81 mmHg (06/17 0637) Pulse Rate: 76 (06/17 0637) Labs:  Recent Labs  01/10/15 1828 01/11/15 0650 01/11/15 1712 01/12/15 0540  HGB 6.7* 9.1* 9.5* 8.1*  HCT 19.7* 26.9* 27.7* 24.5*  PLT 17* 15* 48* 51*  LABPROT  --  16.1*  --  16.0*  INR  --  1.28  --  1.27  CREATININE 1.20 1.32*  --  1.14   Estimated Creatinine Clearance: 81.4 mL/min (by C-G formula based on Cr of 1.14).  Assessment: 59 year old male on chronic Coumadin therapy for history of PE in 2015 and high risk for re-thrombosis due to cancer. Pharmacy consulted to dose Coumadin while inpatient. INR today is 1.27 after Coumadin 3mg  po x1 dose. Patient experiencing vitamin K resistance due to vitamin K back on 6/14. Pancytopenic s/p chemotherapy being followed by Heme-Onc who approved Coumadin restart due to high risk of clot.   Counts are improving: Platelets are up to 51, Hgb 8.1 s/p transfusions of both PRBCs and Platelets. Patient noted to have hematuria - being followed by both primary team and heme-onc. If this continues, teams are considering semi-permanent IVC filter placement and stopping anticoagulation.   Goal of Therapy:  INR 2-3 Monitor platelets by anticoagulation protocol: Yes   Plan:  Coumadin 3mg  (note vitamin K resistance but also hematuria) po x1 tonight.  Daily PT/INR.  Monitor hematuria and need for IVC filter vs Coumadin.  Coumadin education was reviewed with patient on 01/11/15.   Sloan Leiter, PharmD, BCPS Clinical Pharmacist [phone] 01/12/2015,10:24 AM

## 2015-01-12 NOTE — Plan of Care (Signed)
Problem: Phase II Progression Outcomes Goal: Discharge plan established Outcome: Completed/Met Date Met:  01/12/15 Patient to return to [facility]

## 2015-01-12 NOTE — Progress Notes (Signed)
Laboratory data reviewed over the last 24 hours. His counts continue to improve with white cell count up to 1.9 and pelvic count of 51. His hemoglobin responded to 9.5 currently at 8.1.  I agree with the current management and do not see any need for growth factor support with transfusions at this time.  If he continues to have brisk hematuria, I would consider placement of IVC filter on a semipermanent basis and stopping his anticoagulation.  Please call with questions over the weekend.

## 2015-01-12 NOTE — Evaluation (Signed)
Occupational Therapy Evaluation Patient Details Name: Samuel [name] MRN: [id number] DOB: [birth year] Today's Date: 01/12/2015    History of Present Illness referred to the ER after patient's urologist from the patient's hemoglobin is around 6   Clinical Impression   Pt required max A and increased time to sit EOB [name] to difficulty following directions and indecisiveness about technique. Pt with incoherent speech at times. Pt kept eyes closed during 30% of session although he continued to talk and participate. Pt is a SNF resident and plan is  to return to SNF once Hgb is normal. No further acute OT is indicated at this time, further OT Intervention as necessary deferred to SNF    Follow Up Recommendations  SNF;Supervision/Assistance - 24 hour    Equipment Recommendations  None recommended by OT    Recommendations for Other Services PT consult     Precautions / Restrictions Precautions Precautions: Fall Precaution Comments: pt uses trapeze with staff at SNF to get up in chair per pt report Restrictions Weight Bearing Restrictions: No      Mobility Bed Mobility Overal bed mobility: Needs Assistance Bed Mobility: Supine to Sit;Rolling;Sit to Supine Rolling: Mod assist   Supine to sit: Max assist Sit to supine: Max assist   General bed mobility comments: Pt is accustomed to having help for all mobility. pt's speech incoherent at times with eyes closed 30% of session, but talking . Pt required increased time to complete bed mobility to sit EOB [name] to difficulty following instrcuctions and indecisiveness about technique he uses at [facility]  Transfers                 General transfer comment: Not assessed    Balance   Sitting-balance support: Bilateral upper extremity supported;Single extremity supported Sitting balance-Leahy Scale: Poor                                      ADL Overall ADL's : Needs assistance/impaired     Grooming: Wash/dry  hands;Wash/dry face;Sitting;Minimal assistance Grooming Details (indicate cue type and reason): Poor siting balance at EOB Upper Body Bathing: Moderate assistance;Sitting Upper Body Bathing Details (indicate cue type and reason): Poor siting balance at EOB Lower Body Bathing: Total assistance   Upper Body Dressing : Minimal assistance;Sitting   Lower Body Dressing: Total assistance Lower Body Dressing Details (indicate cue type and reason): Poor siting balance at EOB   Toilet Transfer Details (indicate cue type and reason): not assesed           General ADL Comments: pt's speech incoherent at times with eyes closed 30% of session, but talking . Pt required increased time to complete bed mobility to sit EOB [name] to difficulty following instrcuctions and indecisiveness about technique he uses at SNF     Vision  no change from baseline   Perception Perception Perception Tested?: No   Praxis Praxis Praxis tested?: Not tested    Pertinent Vitals/Pain Pain Assessment: No/denies pain     Hand Dominance Right   Extremity/Trunk Assessment Upper Extremity Assessment Upper Extremity Assessment: Generalized weakness   Lower Extremity Assessment Lower Extremity Assessment: Defer to PT evaluation   Cervical / Trunk Assessment Cervical / Trunk Assessment: Normal   Communication Communication Communication: Other (comment) (incoherent speech at times)   Cognition Arousal/Alertness: Awake/alert Behavior During Therapy: WFL for tasks assessed/performed Overall Cognitive Status: Within Functional Limits for tasks assessed  General Comments   pt pleasant and cooperative                 Home Living Family/patient expects to be discharged to:: [place]: Other (Comment) (SNF )                                      Prior Functioning/Environment Level of Independence: Needs assistance  Gait /  Transfers Assistance Needed: transfers with trapeze and staff members ADL's / Homemaking Assistance Needed: SNF resident. pt reports that he is total A with bathing and set up with dressing/grooming, uncertain how accurate this report is        OT Diagnosis: Generalized weakness   OT Problem List: Decreased strength;Decreased activity tolerance;Impaired balance (sitting and/or standing)   OT Treatment/Interventions:      OT Goals(Current goals can be found in the care plan section) Acute Rehab OT Goals Patient Stated Goal: "go back to my place" OT Goal Formulation: With patient  OT Frequency:     Barriers to D/C:  none                        End of Session    Activity Tolerance: Patient tolerated treatment well Patient left: in bed;with call bell/phone within reach   Time: 1101-1124 OT Time Calculation (min): 23 min Charges:  OT General Charges $OT Visit: 1 Procedure OT Evaluation $Initial OT Evaluation Tier I: 1 Procedure OT Treatments $Therapeutic Activity: 8-22 mins G-Codes:    Britt Bottom 01/12/2015, 1:18 PM

## 2015-01-12 NOTE — Progress Notes (Signed)
[facility]                                                                              Patient Demographics  Samuel [name], is a 59 y.o. male, DOB - [birth year], TFT:[id number]  Admit date - 01/10/2015   Admitting Physician Samuel Patience, MD  Outpatient Primary MD for the patient is Samuel Brightly, MD  LOS - 1   Chief Complaint  Patient presents with  . anemia       HPI on 01/11/2015 by Dr. Loetta Rough Aizik [name] is a 59 y.o. male with history of urothelial cancer status post recent chemotherapy, history of pulmonary embolism on Coumadin, paraplegia, left AKA, chronic indwelling catheter was referred to the ER after patient's urologist from the patient's hemoglobin is around 6. Patient has had recent chemotherapy and had followed up with his urologist Dr. Junious [name] and had blood works done which showed low hemoglobin and was advised to come to the ER. Patient over the last 2 days has been having hematuria. Patient blood work also shows pancytopenia probably from chemotherapy. Patient is afebrile at this time. Patient is also on Invanz started recently for ESBL UTI. Patient has been admitted for transfusion and further management of patient's pancytopenia anemia and hematuria. ER physician did discuss with on-call urologist about the hematuria for which at this time urologist as recommended transfusion and closely observe. Patient denies any shortness of breath chest pain nausea vomiting or abdominal pain.   Assessment & Plan   Acute blood loss anemia/hematuria -patient currently on coumadin -Positive admission, hemoglobin was 6.7 however PT 9.5 currently at 8.1 -Will continue to monitor H&H closely -Patient may require IVC filter placement if he continues to have brisk immature area.  Urothelial cancer -Oncology consulted and aprpeciated -Receiving carboplatin, gemcitabine -Oncology, therapy currently held due to pancytopenia  History of pulmonary  embolism  -Currently on coumadin- per pharmacy -Had IVC filter placed, however, removed- not clear as to why, patient could not provide details  Recurrent ESBL UTI -Continue Invanz  Pancytopenia  -Secondary to chemotherapy -Oncology consulted  -VBAC increasing to 1.9  History of paraplegia   Bipolar disorder -Continue Lamictal, Seroquel, amitriptyline  Code Status: Full  Family Communication: None at bedside  Disposition Plan: Admitted.  If patient continues to bleed, we'll consult IR for IVC filter placement.  Time Spent in minutes   30 minutes  Procedures  None  Consults   oncology  DVT Prophylaxis  coumadin  Lab Results  Component Value Date   PLT 51* 01/12/2015    Medications  Scheduled Meds: . sodium chloride   Intravenous Once  . amitriptyline  75 mg Oral QHS  . atorvastatin  10 mg Oral q1800  . Chlorhexidine Gluconate Cloth  6 each Topical Q0600  . docusate sodium  100 mg Oral BID  . ertapenem  1 g Intravenous Q24H  . famotidine  20 mg Oral QHS  . ferrous sulfate  325 mg Oral BID WC  . folic acid  1 mg Oral Daily  . lactose free nutrition  237 mL Oral BID BM  . lactulose  20 g Oral TID AC & HS  . lamoTRIgine  200  mg Oral BID  . metoCLOPramide  5 mg Oral TID AC & HS  . metoprolol tartrate  25 mg Oral BID  . mupirocin ointment  1 application Nasal BID  . oxybutynin  5 mg Oral q morning - 10a  . pantoprazole  40 mg Oral Daily  . polyvinyl alcohol  2 drop Both Eyes TID  . potassium chloride  10 mEq Oral BID  . pregabalin  100 mg Oral TID  . QUEtiapine  300 mg Oral BID  . senna  2 tablet Oral q morning - 10a  . cyanocobalamin  100 mcg Oral QODAY  . warfarin  3 mg Oral ONCE-1800  . Warfarin - Pharmacist Dosing Inpatient   Does not apply q1800   Continuous Infusions: . sodium chloride     PRN Meds:.acetaminophen **OR** acetaminophen, clonazePAM, guaiFENesin, lidocaine, nitroGLYCERIN, ondansetron **OR** ondansetron (ZOFRAN) IV, oxyCODONE,  promethazine  Antibiotics    Anti-infectives    Start     Dose/Rate Route Frequency Ordered Stop   01/11/15 1000  ertapenem (INVANZ) 1 g in sodium chloride 0.9 % 50 mL IVPB     1 g 100 mL/hr over 30 Minutes Intravenous Every 24 hours 01/11/15 0023 01/15/15 0959   01/11/15 0015  ertapenem [facility]) injection 1 g  Status:  Discontinued     1 g Intramuscular Every 24 hours 01/11/15 0005 01/11/15 0023      Subjective:   Samuel [name] seen and examined today.  Patient has no complaints this morning. He does state that from time to time his acid reflux bothers him and would like increased dose of times. He has a chest pain shortness of breath or abdominal pain.  Objective:   Filed Vitals:   01/11/15 0930 01/11/15 1723 01/11/15 2124 01/12/15 0637  BP: 110/72 125/77 117/75 125/81  Pulse: 76 84 96 76  Temp: 97.8 F (36.6 C) 98.2 F (36.8 C) 98.9 F (37.2 C) 97.8 F (36.6 C)  TempSrc: Oral Oral Oral Oral  Resp: 21  18 18   Height:      Weight:      SpO2: 96% 98% 96% 95%    Wt Readings from Last 3 Encounters:  01/10/15 108.863 kg (240 lb)  12/06/14 104.509 kg (230 lb 6.4 oz)  12/02/14 104.327 kg (230 lb)     Intake/Output Summary (Last 24 hours) at 01/12/15 1334 Last data filed at 01/12/15 1128  Gross per 24 hour  Intake   1404 ml  Output   1325 ml  Net     79 ml    Exam  General: Well developed, well nourished, NAD, appears stated age  37: NCAT,mucous membranes moist.   Cardiovascular: S1 S2 auscultated, no rubs, murmurs or gallops. Regular rate and rhythm.  Respiratory: Clear to auscultation bilaterally  Abdomen: Soft, obese, nontender, nondistended, + bowel sounds  Extremities: Left leg stump  Neuro: AAOx3, nonfocal  Psych: Flat affect  Data Review   Micro Results Recent Results (from the past 240 hour(s))  MRSA PCR Screening     Status: Abnormal   Collection Time: 01/10/15 10:27 PM  Result Value Ref Range Status   MRSA by PCR POSITIVE (A)  NEGATIVE Final    Comment:        The GeneXpert MRSA Assay (FDA approved for NASAL specimens only), is one component of a comprehensive MRSA colonization surveillance program. It is not intended to diagnose MRSA infection nor to guide or monitor treatment for MRSA infections. RESULT CALLED TO, READ BACK BY AND  VERIFIED WITH: D.THOMPSON RN 0126 01/11/15 E.[place]     Radiology Reports No results found.  CBC  Recent Labs Lab 01/10/15 1828 01/11/15 0650 01/11/15 1712 01/12/15 0540  WBC 1.1* 1.5* 1.6* 1.9*  HGB 6.7* 9.1* 9.5* 8.1*  HCT 19.7* 26.9* 27.7* 24.5*  PLT 17* 15* 48* 51*  MCV 75.2* 76.0* 76.3* 76.6*  MCH 25.6* 25.7* 26.2 25.3*  MCHC 34.0 33.8 34.3 33.1  RDW 15.3 15.6* 15.8* 15.8*  LYMPHSABS 0.6* 0.7 0.6* 0.8  MONOABS 0.1 0.2 0.3 0.3  EOSABS 0.0 0.0 0.0 0.0  BASOSABS 0.0 0.0 0.0 0.0    Chemistries   Recent Labs Lab 01/10/15 1828 01/11/15 0650 01/12/15 0540  NA 134* 136 137  K 3.9 4.3 3.7  CL 103 106 108  CO2 21* 21* 22  GLUCOSE 91 95 111*  BUN 22* 24* 20  CREATININE 1.20 1.32* 1.14  CALCIUM 8.4* 8.5* 8.5*  AST 30 31  --   ALT 20 20  --   ALKPHOS 85 85  --   BILITOT 0.5 0.9  --    ------------------------------------------------------------------------------------------------------------------ estimated creatinine clearance is 81.4 mL/min (by C-G formula based on Cr of 1.14). ------------------------------------------------------------------------------------------------------------------ No results for input(s): HGBA1C in the last 72 hours. ------------------------------------------------------------------------------------------------------------------ No results for input(s): CHOL, HDL, LDLCALC, TRIG, CHOLHDL, LDLDIRECT in the last 72 hours. ------------------------------------------------------------------------------------------------------------------ No results for input(s): TSH, T4TOTAL, T3FREE, THYROIDAB in the last 72 hours.  Invalid  input(s): FREET3 ------------------------------------------------------------------------------------------------------------------ No results for input(s): VITAMINB12, FOLATE, FERRITIN, TIBC, IRON, RETICCTPCT in the last 72 hours.  Coagulation profile  Recent Labs Lab 01/11/15 0650 01/12/15 0540  INR 1.28 1.27    No results for input(s): DDIMER in the last 72 hours.  Cardiac Enzymes No results for input(s): CKMB, TROPONINI, MYOGLOBIN in the last 168 hours.  Invalid input(s): CK ------------------------------------------------------------------------------------------------------------------ Invalid input(s): POCBNP    Keriana Sarsfield D.O. on 01/12/2015 at 1:34 PM  Between 7am to 7pm - Pager - [phone]  After 7pm go to [email or web address] - password [email or web address]  And look for the night coverage person covering for me after hours  [facility] Office  [phone]

## 2015-01-12 NOTE — Clinical Social Work Note (Signed)
Clinical Social Work Assessment  Patient Details  Name: Samuel [name] MRN: [id number] Date of Birth: [birth year]  Date of referral:  01/12/15               Reason for consult:  Discharge Planning, Facility Placement                Permission sought to share information with:  Facility Art therapist granted to share information::  Yes, Verbal Permission Granted  Name::        Agency::  [facility]  Relationship::     Contact Information:     Housing/Transportation Living arrangements for the past 2 months:  [place] of Information:  Facility, Patient Patient Interpreter Needed:  None Criminal Activity/Legal Involvement Pertinent to Current Situation/Hospitalization:  No - Comment as needed Significant Relationships:  Adult Children Lives with:  Facility Resident Do you feel safe going back to the place where you live?  Yes Need for family participation in patient care:  No (Coment)  Care giving concerns:  Patient does not report any concerns at this time.   Social Worker assessment / plan:  Patient was admitted from [facility] for acute blood loss anemia. CSW met with the patient at bedside to complete assessment. Patient states that he plans to return to SNF once medically stable. Patient was discussed with MD who plans for patient to DC once hemoglobin is stable. CSW has made arrangement with facility for weekend DC.  Employment status:  Disabled (Comment on whether or not currently receiving Disability) Insurance information:  Managed Medicare PT Recommendations:  [place] / Referral to community resources:  [place]  Patient/Family's Response to care:  Patient seems to be happy with the care he has received here at [facility].  Patient/Family's Understanding of and Emotional Response to Diagnosis, Current Treatment, and Prognosis:  Patient appears to have good insight into  reason for admission and understands that safest plan for discharge is to return to [place].  Emotional Assessment Appearance:  Appears stated age Attitude/Demeanor/Rapport:  Other (Appropriate) Affect (typically observed):  Accepting, Calm, Stable Orientation:  Oriented to Self, Oriented to Place, Oriented to  Time, Oriented to Situation Alcohol / Substance use:  Tobacco Use (Hx of) Psych involvement (Current and /or in the community):  No (Comment)  Discharge Needs  Concerns to be addressed:  Discharge Planning Concerns Readmission within the last 30 days:  Yes Current discharge risk:  Physical Impairment Barriers to Discharge:  Continued Medical Work up   [employer] MSW, [place], [place], [phone]

## 2015-01-12 NOTE — Evaluation (Signed)
Physical Therapy Evaluation Patient Details Name: Samuel [name] MRN: [id number] DOB: 1956/03/12 Today's Date: 01/12/2015   History of Present Illness  Patient is a 59 yo male admitted 01/10/15 with acute blood loss anemia.  PMH:  Lt AKA (traumatic), urothelial CA with chemo, paraplegia, HTN, seizures, bipolar disorder  Clinical Impression  Patient presents with problems listed below.  Will benefit from acute PT to maximize functional mobility prior to discharge.  Recommend patient return to SNF at discharge.    Follow Up Recommendations SNF    Equipment Recommendations  None recommended by PT    Recommendations for Other Services       Precautions / Restrictions Precautions Precautions: Fall Precaution Comments: pt uses trapeze with staff at SNF to get up in chair per pt report Restrictions Weight Bearing Restrictions: No      Mobility  Bed Mobility Overal bed mobility: Needs Assistance Bed Mobility: Supine to Sit;Sit to Supine Rolling: Mod assist   Supine to sit: Max assist Sit to supine: Min assist;HOB elevated   General bed mobility comments: Patient requiring max assist to bring trunk to sitting position and to bring hips toward EOB.  Once upright, patient able to maintain balance with min guard assist.  Patient able to return to supine with min assist to bring RLE back onto bed.  Transfers                 General transfer comment: Not assessed  Ambulation/Gait                Stairs            Wheelchair Mobility    Modified Rankin (Stroke Patients Only)       Balance Overall balance assessment: Needs assistance Sitting-balance support: No upper extremity supported Sitting balance-Leahy Scale: Fair Sitting balance - Comments: Able to maintain sitting balance with min guard assist.                                     Pertinent Vitals/Pain Pain Assessment: No/denies pain    Home Living Family/patient expects to be  discharged to:: Skilled nursing facility Living Arrangements: Other (Comment) (From [facility] SNF)                    Prior Function Level of Independence: Needs assistance   Gait / Transfers Assistance Needed: Per patient, he performs scooting transfers bed <> w/c, using trapeze to initiate movement.  ADL's / Homemaking Assistance Needed: Assist for bathing, dressing.  Meals provided        Hand Dominance   Dominant Hand: Right    Extremity/Trunk Assessment   Upper Extremity Assessment: Defer to OT evaluation           Lower Extremity Assessment: RLE deficits/detail;LLE deficits/detail RLE Deficits / Details: Min to no movement RLE LLE Deficits / Details: AKA  Cervical / Trunk Assessment: Normal  Communication   Communication: No difficulties (Difficulty understanding patient at times)  Cognition Arousal/Alertness: Awake/alert Behavior During Therapy: WFL for tasks assessed/performed;Agitated Overall Cognitive Status: Within Functional Limits for tasks assessed                      General Comments      Exercises        Assessment/Plan    PT Assessment Patient needs continued PT services  PT Diagnosis Generalized weakness   PT Problem List  Decreased strength;Decreased activity tolerance;Decreased balance;Decreased mobility  PT Treatment Interventions Functional mobility training;Therapeutic activities;Balance training;Patient/family education   PT Goals (Current goals can be found in the Care Plan section) Acute Rehab PT Goals Patient Stated Goal: "go back to my place" PT Goal Formulation: With patient Time For Goal Achievement: 01/19/15 Potential to Achieve Goals: Good    Frequency Min 2X/week   Barriers to discharge        Co-evaluation               End of Session   Activity Tolerance: Patient tolerated treatment well Patient left: in bed;with call bell/phone within reach Nurse Communication: Mobility status          Time: 8887-5797 PT Time Calculation (min) (ACUTE ONLY): 10 min   Charges:   PT Evaluation $Initial PT Evaluation Tier I: 1 Procedure     PT G CodesDespina Pole 09-Feb-2015, 3:00 PM Carita Pian. Sanjuana Kava, [place] Pager [phone]

## 2015-01-12 NOTE — Clinical Social Work Note (Signed)
Patient is a potential weekend DC. [facility] admission coordinator Gerhard Perches states that patient can return over the weekend if discharged. Jeani Hawking states that DC Summary can be sent through [employer] and the discharge can be facilitated as a weekday DC. Report left for weekend CSW.  Liz Beach MSW, [place], [place], [phone]

## 2015-01-13 DIAGNOSIS — F319 Bipolar disorder, unspecified: Secondary | ICD-10-CM

## 2015-01-13 DIAGNOSIS — T451X5A Adverse effect of antineoplastic and immunosuppressive drugs, initial encounter: Secondary | ICD-10-CM

## 2015-01-13 DIAGNOSIS — N390 Urinary tract infection, site not specified: Secondary | ICD-10-CM

## 2015-01-13 DIAGNOSIS — D61810 Antineoplastic chemotherapy induced pancytopenia: Principal | ICD-10-CM

## 2015-01-13 LAB — CBC WITH DIFFERENTIAL/PLATELET
Basophils Absolute: 0 K/uL (ref 0.0–0.1)
Basophils Relative: 0 % (ref 0–1)
Eosinophils Absolute: 0 K/uL (ref 0.0–0.7)
Eosinophils Relative: 0 % (ref 0–5)
HCT: 24.2 % — ABNORMAL LOW (ref 39.0–52.0)
Hemoglobin: 8.1 g/dL — ABNORMAL LOW (ref 13.0–17.0)
Lymphocytes Relative: 36 % (ref 12–46)
Lymphs Abs: 0.9 K/uL (ref 0.7–4.0)
MCH: 25.5 pg — ABNORMAL LOW (ref 26.0–34.0)
MCHC: 33.5 g/dL (ref 30.0–36.0)
MCV: 76.1 fL — ABNORMAL LOW (ref 78.0–100.0)
Monocytes Absolute: 0.5 K/uL (ref 0.1–1.0)
Monocytes Relative: 19 % — ABNORMAL HIGH (ref 3–12)
Neutro Abs: 1.2 K/uL — ABNORMAL LOW (ref 1.7–7.7)
Neutrophils Relative %: 45 % (ref 43–77)
Platelets: 77 K/uL — ABNORMAL LOW (ref 150–400)
RBC: 3.18 MIL/uL — ABNORMAL LOW (ref 4.22–5.81)
RDW: 16 % — ABNORMAL HIGH (ref 11.5–15.5)
WBC: 2.6 K/uL — ABNORMAL LOW (ref 4.0–10.5)

## 2015-01-13 LAB — PROTIME-INR
INR: 1.45 (ref 0.00–1.49)
Prothrombin Time: 17.7 s — ABNORMAL HIGH (ref 11.6–15.2)

## 2015-01-13 LAB — BASIC METABOLIC PANEL
ANION GAP: 8 (ref 5–15)
BUN: 14 mg/dL (ref 6–20)
CHLORIDE: 105 mmol/L (ref 101–111)
CO2: 23 mmol/L (ref 22–32)
Calcium: 8.5 mg/dL — ABNORMAL LOW (ref 8.9–10.3)
Creatinine, Ser: 0.9 mg/dL (ref 0.61–1.24)
Glucose, Bld: 92 mg/dL (ref 65–99)
POTASSIUM: 4.2 mmol/L (ref 3.5–5.1)
Sodium: 136 mmol/L (ref 135–145)

## 2015-01-13 MED ORDER — SODIUM CHLORIDE 0.9 % IJ SOLN
10.0000 mL | INTRAMUSCULAR | Status: DC | PRN
  Administered 2015-01-13 (×2): 10 mL
  Filled 2015-01-13: qty 40

## 2015-01-13 MED ORDER — MUPIROCIN 2 % EX OINT: 1.0000 "application " | TOPICAL_OINTMENT | Freq: Two times a day (BID) | CUTANEOUS | Status: DC

## 2015-01-13 MED ORDER — HEPARIN SOD (PORK) LOCK FLUSH 100 UNIT/ML IV SOLN
500.0000 [IU] | INTRAVENOUS | Status: AC | PRN
  Administered 2015-01-13: 500 [IU]

## 2015-01-13 MED ORDER — OXYCODONE HCL 5 MG PO TABS: 5.0000 mg | ORAL_TABLET | ORAL | Status: DC | PRN

## 2015-01-13 NOTE — Progress Notes (Addendum)
01/13/15 patient discharged to SNF today, IV site deaccessed and discharge instructions reviewed with SNF and patient.

## 2015-01-13 NOTE — Discharge Summary (Signed)
Physician Discharge Summary  Samuel [name] [facility]:[id number] DOB: [birth year] DOA: 01/10/2015  PCP: Samuel Brightly, MD  Admit date: 01/10/2015 Discharge date: 01/13/2015  Time spent: 45 minutes  Recommendations for Outpatient Follow-up:  Patient will be discharged to [facility].  Patient will need to follow up with primary care provider within one week of discharge.  Patient should also follow up with oncology, Samuel [name].  Patient will need repeat CBC on 01/15/2015, results to be sent to Samuel [name].  Patient should continue medications as prescribed.  Patient should follow a regular diet.   Discharge Diagnoses:  Acute Blood loss anemia/hematuria Urothelial cancer History of pulmonary embolism Recurrent ESBL UTI Pancytopenia History paraplegia Bipolar disorder  Discharge Condition: Stable  Diet recommendation: Regular  Filed Weights   01/10/15 1736  Weight: 108.863 kg (240 lb)    History of present illness:  on 01/11/2015 by Dr. Loetta Rough Cheston [name] is a 59 y.o. male with history of urothelial cancer status post recent chemotherapy, history of pulmonary embolism on Coumadin, paraplegia, left AKA, chronic indwelling catheter was referred to the ER after patient's urologist from the patient's hemoglobin is around 6. Patient has had recent chemotherapy and had followed up with his urologist Dr. Junious [name] and had blood works done which showed low hemoglobin and was advised to come to the ER. Patient over the last 2 days has been having hematuria. Patient blood work also shows pancytopenia probably from chemotherapy. Patient is afebrile at this time. Patient is also on Invanz started recently for ESBL UTI. Patient has been admitted for transfusion and further management of patient's pancytopenia anemia and hematuria. ER physician did discuss with on-call urologist about the hematuria for which at this time urologist as recommended transfusion and closely  observe. Patient denies any shortness of breath chest pain nausea vomiting or abdominal pain.   Hospital Course:  Acute blood loss anemia/hematuria -patient currently on coumadin -Positive admission, hemoglobin was 6.7 however PT 9.5 currently at 8.1 (has remained steady) -Patient may require IVC filter placement if he continues to have brisk hematuria -Patient should have repeat CBC in 2 days, 01/15/2015.  Urothelial cancer -Oncology consulted and aprpeciated -Receiving carboplatin, gemcitabine -Oncology, therapy currently held due to pancytopenia  History of pulmonary embolism  -Currently on coumadin- per pharmacy -Had IVC filter placed, however, removed- not clear as to why, patient could not provide details  Recurrent ESBL UTI -Continue Invanz  Pancytopenia  -Secondary to chemotherapy -Oncology consulted  -WBC improving, 2.6 -Plts improving  77  History of paraplegia   Bipolar disorder -Continue Lamictal, Seroquel, amitriptyline  Procedures  None  Consults  oncology  Discharge Exam: Filed Vitals:   01/13/15 0556  BP: 108/62  Pulse: 75  Temp: 98.3 F (36.8 C)  Resp: 20    Exam  General: Well developed, well nourished, no distress  HEENT: NCAT,mucous membranes moist.   Cardiovascular: S1 S2 auscultated, RRR  Respiratory: Clear to auscultation  Abdomen: Soft, obese, nontender, nondistended, + bowel sounds  Extremities: Left leg stump  Neuro: AAOx3, nonfocal  Psych: Appropriate mood and affect  Discharge Instructions      Discharge Instructions    Discharge instructions    Complete by:  As directed   Patient will be discharged to [facility] facility.  Patient will need to follow up with primary care provider within one week of discharge.  Patient should also follow up with oncology, Samuel [name].  Patient will need repeat CBC on 01/15/2015, results to be sent to Dr.  Shadad.  Patient should continue medications as prescribed.  Patient  should follow a regular diet.            Medication List    STOP taking these medications        phytonadione 5 MG tablet  Commonly known as:  VITAMIN K     ROCEPHIN 1 G injection  Generic drug:  cefTRIAXone      TAKE these medications        acetaminophen 325 MG tablet  Commonly known as:  TYLENOL  Take 650 mg by mouth every 6 (six) hours as needed for moderate pain.     alum & mag hydroxide-simeth [phone] MG/5ML suspension  Commonly known as:  MAALOX PLUS  Take 20 mLs by mouth every 6 (six) hours as needed for indigestion.     amitriptyline 75 MG tablet  Commonly known as:  ELAVIL  Take 75 mg by mouth at bedtime.     atorvastatin 10 MG tablet  Commonly known as:  LIPITOR  Take 10 mg by mouth daily at 6 PM.     Cholecalciferol 50000 UNITS capsule  Take 50,000 Units by mouth every 30 (thirty) days.     clonazePAM 0.5 MG tablet  Commonly known as:  KLONOPIN  Take 0.25-0.5 mg by mouth 4 (four) times daily as needed for anxiety (takes 0.25mg  twice daily, and takes 0.5mg  as needed for anxiety).     cyanocobalamin 1000 MCG tablet  Take 100 mcg by mouth every other day.     DEXILANT 60 MG capsule  Generic drug:  dexlansoprazole  Take 60 mg by mouth daily.     docusate sodium 100 MG capsule  Commonly known as:  COLACE  Take 100 mg by mouth 2 (two) times daily.     famotidine 20 MG tablet  Commonly known as:  PEPCID  Take 20 mg by mouth at bedtime.     ferrous sulfate 325 (65 FE) MG EC tablet  Take 325 mg by mouth 2 (two) times daily.     folic acid 1 MG tablet  Commonly known as:  FOLVITE  Take 1 mg by mouth daily.     guaiFENesin 100 MG/5ML liquid  Commonly known as:  ROBITUSSIN  Take 300 mg by mouth 3 (three) times daily as needed for cough.     INVANZ 1 G injection  Generic drug:  ertapenem  Inject 1 g into the muscle daily. Started 01/05/15 for 10 days ending 01/14/15     lactose free nutrition Liqd  Take 237 mLs by mouth 2 (two) times daily  between meals.     lactulose 10 GM/15ML solution  Commonly known as:  CHRONULAC  Take 20 g by mouth 4 (four) times daily.     lamoTRIgine 200 MG tablet  Commonly known as:  LAMICTAL  Take 200 mg by mouth 2 (two) times daily.     lidocaine 5 %  Commonly known as:  LIDODERM  Place 2 patches onto the skin as needed (for pain). Remove & Discard patch within 12 hours or as directed by MD (apply 2 patches to left stump daily at 9pm     metoCLOPramide 5 MG tablet  Commonly known as:  REGLAN  Take 5 mg by mouth 4 (four) times daily.     metoprolol tartrate 25 MG tablet  Commonly known as:  LOPRESSOR  Take 1 tablet (25 mg total) by mouth 2 (two) times daily.     mupirocin ointment 2 %  Commonly known as:  BACTROBAN  Place 1 application into the nose 2 (two) times daily. Through 01/16/2015     nitroGLYCERIN 0.4 MG SL tablet  Commonly known as:  NITROSTAT  Place 0.4 mg under the tongue every 5 (five) minutes as needed for chest pain.     Normal Saline Flush 0.9 % Soln  Inject 10 mLs into the vein See admin instructions. For flushing porta cath every 4 weeks, when port not in use     ondansetron 4 MG tablet  Commonly known as:  ZOFRAN  Take 4 mg by mouth every 8 (eight) hours as needed for nausea or vomiting.     oxybutynin 5 MG 24 hr tablet  Commonly known as:  DITROPAN-XL  Take 5 mg by mouth every morning.     oxyCODONE 5 MG immediate release tablet  Commonly known as:  Oxy IR/ROXICODONE  Take 1 tablet (5 mg total) by mouth every 4 (four) hours as needed for severe pain.     potassium chloride 10 MEQ tablet  Commonly known as:  K-DUR,KLOR-CON  Take 10 mEq by mouth 2 (two) times daily.     pregabalin 100 MG capsule  Commonly known as:  LYRICA  Take 100 mg by mouth 3 (three) times daily.     promethazine 12.5 MG tablet  Commonly known as:  PHENERGAN  Take 12.5 mg by mouth every 6 (six) hours as needed for nausea or vomiting.     QUEtiapine 300 MG tablet  Commonly known  as:  SEROQUEL  Take 300 mg by mouth 2 (two) times daily.     senna 8.6 MG tablet  Commonly known as:  SENOKOT  Take 2 tablets by mouth every morning.     SYSTANE 0.4-0.3 % Soln  Generic drug:  Polyethyl Glycol-Propyl Glycol  Apply 2 drops to eye 3 (three) times daily.     warfarin 3 MG tablet  Commonly known as:  COUMADIN  Take 3 mg by mouth daily at 6 PM.       Allergies  Allergen Reactions  . Tomato Other (See Comments)    Causes acid reflux   Follow-up Information    Follow up with [facility], MD. Schedule an appointment as soon as possible for a visit in 1 week.   Specialty:  Oncology   Why:  Hospital follow up, pancytopenia, cancer   Contact information:   [address] [address] [phone]       Follow up with Samuel Brightly, MD. Schedule an appointment as soon as possible for a visit in 1 week.   Specialty:  Internal Medicine   Why:  Hospital follow up   Contact information:   [place] [address] [phone]        The results of significant diagnostics from this hospitalization (including imaging, microbiology, ancillary and laboratory) are listed below for reference.    Significant Diagnostic Studies: No results found.  Microbiology: Recent Results (from the past 240 hour(s))  MRSA PCR Screening     Status: Abnormal   Collection Time: 01/10/15 10:27 PM  Result Value Ref Range Status   MRSA by PCR POSITIVE (A) NEGATIVE Final    Comment:        The GeneXpert MRSA Assay (FDA approved for NASAL specimens only), is one component of a comprehensive MRSA colonization surveillance program. It is not intended to diagnose MRSA infection nor to guide or monitor treatment for MRSA infections. RESULT CALLED TO, READ  BACK BY AND VERIFIED WITH: D.THOMPSON RN [id number] 01/11/15 E.GADDY      Labs: Basic Metabolic Panel:  Recent Labs Lab 01/10/15 1828 01/11/15 0650 01/12/15 0540 01/13/15 0455  NA 134* 136 137 136   K 3.9 4.3 3.7 4.2  CL 103 106 108 105  CO2 21* 21* 22 23  GLUCOSE 91 95 111* 92  BUN 22* 24* 20 14  CREATININE 1.20 1.32* 1.14 0.90  CALCIUM 8.4* 8.5* 8.5* 8.5*   Liver Function Tests:  Recent Labs Lab 01/10/15 1828 01/11/15 0650  AST 30 31  ALT 20 20  ALKPHOS 85 85  BILITOT 0.5 0.9  PROT 6.2* 6.4*  ALBUMIN 2.6* 2.6*   No results for input(s): LIPASE, AMYLASE in the last 168 hours. No results for input(s): AMMONIA in the last 168 hours. CBC:  Recent Labs Lab 01/10/15 1828 01/11/15 0650 01/11/15 1712 01/12/15 0540 01/13/15 0455  WBC 1.1* 1.5* 1.6* 1.9* 2.6*  NEUTROABS 0.4* 0.6* 0.7* 0.8* 1.2*  HGB 6.7* 9.1* 9.5* 8.1* 8.1*  HCT 19.7* 26.9* 27.7* 24.5* 24.2*  MCV 75.2* 76.0* 76.3* 76.6* 76.1*  PLT 17* 15* 48* 51* 77*   Cardiac Enzymes: No results for input(s): CKTOTAL, CKMB, CKMBINDEX, TROPONINI in the last 168 hours. BNP: BNP (last 3 results)  Recent Labs  09/17/14 1651  BNP 41.0    ProBNP (last 3 results) No results for input(s): PROBNP in the last 8760 hours.  CBG: No results for input(s): GLUCAP in the last 168 hours.     SignedCristal Ford  [facility] 01/13/2015, 9:35 AM

## 2015-01-13 NOTE — Discharge Instructions (Signed)
Anemia, Nonspecific Anemia is a condition in which the concentration of red blood cells or hemoglobin in the blood is below normal. Hemoglobin is a substance in red blood cells that carries oxygen to the tissues of the body. Anemia results in not enough oxygen reaching these tissues.  CAUSES  Common causes of anemia include:   Excessive bleeding. Bleeding may be internal or external. This includes excessive bleeding from periods (in women) or from the intestine.   Poor nutrition.   Chronic kidney, thyroid, and liver disease.  Bone marrow disorders that decrease red blood cell production.  Cancer and treatments for cancer.  HIV, AIDS, and their treatments.  Spleen problems that increase red blood cell destruction.  Blood disorders.  Excess destruction of red blood cells due to infection, medicines, and autoimmune disorders. SIGNS AND SYMPTOMS   Minor weakness.   Dizziness.   Headache.  Palpitations.   Shortness of breath, especially with exercise.   Paleness.  Cold sensitivity.  Indigestion.  Nausea.  Difficulty sleeping.  Difficulty concentrating. Symptoms may occur suddenly or they may develop slowly.  DIAGNOSIS  Additional blood tests are often needed. These help your health care provider determine the best treatment. Your health care provider will check your stool for blood and look for other causes of blood loss.  TREATMENT  Treatment varies depending on the cause of the anemia. Treatment can include:   Supplements of iron, vitamin P50, or folic acid.   Hormone medicines.   A blood transfusion. This may be needed if blood loss is severe.   Hospitalization. This may be needed if there is significant continual blood loss.   Dietary changes.  Spleen removal. HOME CARE INSTRUCTIONS Keep all follow-up appointments. It often takes many weeks to correct anemia, and having your health care provider check on your condition and your response to  treatment is very important. SEEK IMMEDIATE MEDICAL CARE IF:   You develop extreme weakness, shortness of breath, or chest pain.   You become dizzy or have trouble concentrating.  You develop heavy vaginal bleeding.   You develop a rash.   You have bloody or black, tarry stools.   You faint.   You vomit up blood.   You vomit repeatedly.   You have abdominal pain.  You have a fever or persistent symptoms for more than 2-3 days.   You have a fever and your symptoms suddenly get worse.   You are dehydrated.  MAKE SURE YOU:  Understand these instructions.  Will watch your condition.  Will get help right away if you are not doing well or get worse. Document Released: 08/21/2004 Document Revised: 03/16/2013 Document Reviewed: 01/07/2013 [facility] Patient Information 2015 [place], [place]. This information is not intended to replace advice given to you by your health care provider. Make sure you discuss any questions you have with your health care provider.  Hematuria Hematuria is blood in your urine. It can be caused by a bladder infection, kidney infection, prostate infection, kidney stone, or cancer of your urinary tract. Infections can usually be treated with medicine, and a kidney stone usually will pass through your urine. If neither of these is the cause of your hematuria, further workup to find out the reason may be needed. It is very important that you tell your health care provider about any blood you see in your urine, even if the blood stops without treatment or happens without causing pain. Blood in your urine that happens and then stops and then happens again  can be a symptom of a very serious condition. Also, pain is not a symptom in the initial stages of many urinary cancers. HOME CARE INSTRUCTIONS   Drink lots of fluid, 3-4 quarts a day. If you have been diagnosed with an infection, cranberry juice is especially recommended, in addition to large amounts of  water.  Avoid caffeine, tea, and carbonated beverages because they tend to irritate the bladder.  Avoid alcohol because it may irritate the prostate.  Take all medicines as directed by your health care provider.  If you were prescribed an antibiotic medicine, finish it all even if you start to feel better.  If you have been diagnosed with a kidney stone, follow your health care provider's instructions regarding straining your urine to catch the stone.  Empty your bladder often. Avoid holding urine for long periods of time.  After a bowel movement, women should cleanse front to back. Use each tissue only once.  Empty your bladder before and after sexual intercourse if you are a male. SEEK MEDICAL CARE IF:  You develop back pain.  You have a fever.  You have a feeling of sickness in your stomach (nausea) or vomiting.  Your symptoms are not better in 3 days. Return sooner if you are getting worse. SEEK IMMEDIATE MEDICAL CARE IF:   You develop severe vomiting and are unable to keep the medicine down.  You develop severe back or abdominal pain despite taking your medicines.  You begin passing a large amount of blood or clots in your urine.  You feel extremely weak or faint, or you pass out. MAKE SURE YOU:   Understand these instructions.  Will watch your condition.  Will get help right away if you are not doing well or get worse. Document Released: 07/14/2005 Document Revised: 11/28/2013 Document Reviewed: 03/14/2013 [facility] Patient Information 2015 [place], [place]. This information is not intended to replace advice given to you by your health care provider. Make sure you discuss any questions you have with your health care provider.

## 2015-01-16 ENCOUNTER — Non-Acute Institutional Stay (SKILLED_NURSING_FACILITY): Payer: Medicare Other | Admitting: Internal Medicine

## 2015-01-16 DIAGNOSIS — C688 Malignant neoplasm of overlapping sites of urinary organs: Secondary | ICD-10-CM

## 2015-01-16 DIAGNOSIS — R319 Hematuria, unspecified: Secondary | ICD-10-CM | POA: Diagnosis not present

## 2015-01-16 DIAGNOSIS — C689 Malignant neoplasm of urinary organ, unspecified: Secondary | ICD-10-CM

## 2015-01-17 ENCOUNTER — Ambulatory Visit (HOSPITAL_BASED_OUTPATIENT_CLINIC_OR_DEPARTMENT_OTHER): Payer: Medicare Other

## 2015-01-17 ENCOUNTER — Other Ambulatory Visit: Payer: Self-pay | Admitting: Physician Assistant

## 2015-01-17 ENCOUNTER — Ambulatory Visit (HOSPITAL_COMMUNITY)
Admission: RE | Admit: 2015-01-17 | Discharge: 2015-01-17 | Disposition: A | Discharge status: Home / Self Care | Payer: Medicare Other | Source: Ambulatory Visit | Attending: Oncology | Admitting: Oncology

## 2015-01-17 ENCOUNTER — Ambulatory Visit (HOSPITAL_BASED_OUTPATIENT_CLINIC_OR_DEPARTMENT_OTHER): Payer: Medicare Other | Admitting: Nurse Practitioner

## 2015-01-17 ENCOUNTER — Other Ambulatory Visit (HOSPITAL_BASED_OUTPATIENT_CLINIC_OR_DEPARTMENT_OTHER): Payer: Medicare Other

## 2015-01-17 VITALS — BP 106/68 | HR 60 | Temp 97.9°F | Resp 20

## 2015-01-17 VITALS — BP 143/73 | HR 75 | Temp 97.5°F | Resp 16

## 2015-01-17 DIAGNOSIS — C679 Malignant neoplasm of bladder, unspecified: Secondary | ICD-10-CM

## 2015-01-17 DIAGNOSIS — D649 Anemia, unspecified: Secondary | ICD-10-CM | POA: Diagnosis present

## 2015-01-17 DIAGNOSIS — I2699 Other pulmonary embolism without acute cor pulmonale: Secondary | ICD-10-CM | POA: Diagnosis not present

## 2015-01-17 DIAGNOSIS — C689 Malignant neoplasm of urinary organ, unspecified: Secondary | ICD-10-CM

## 2015-01-17 LAB — ABO/RH: ABO/RH(D): O POS

## 2015-01-17 LAB — CBC WITH DIFFERENTIAL/PLATELET
BASO%: 0.5 % (ref 0.0–2.0)
BASOS ABS: 0 10*3/uL (ref 0.0–0.1)
EOS ABS: 0 10*3/uL (ref 0.0–0.5)
EOS%: 0 % (ref 0.0–7.0)
HCT: 24.2 % — ABNORMAL LOW (ref 38.4–49.9)
HGB: 7.8 g/dL — ABNORMAL LOW (ref 13.0–17.1)
LYMPH%: 24.9 % (ref 14.0–49.0)
MCH: 25.9 pg — ABNORMAL LOW (ref 27.2–33.4)
MCHC: 32.2 g/dL (ref 32.0–36.0)
MCV: 80.4 fL (ref 79.3–98.0)
MONO#: 1.2 10*3/uL — AB (ref 0.1–0.9)
MONO%: 29.8 % — AB (ref 0.0–14.0)
NEUT#: 1.8 10*3/uL (ref 1.5–6.5)
NEUT%: 44.8 % (ref 39.0–75.0)
NRBC: 2 % — AB (ref 0–0)
Platelets: 418 10*3/uL — ABNORMAL HIGH (ref 140–400)
RBC: 3.01 10*6/uL — ABNORMAL LOW (ref 4.20–5.82)
RDW: 16.5 % — ABNORMAL HIGH (ref 11.0–14.6)
WBC: 4.1 10*3/uL (ref 4.0–10.3)
lymph#: 1 10*3/uL (ref 0.9–3.3)

## 2015-01-17 LAB — COMPREHENSIVE METABOLIC PANEL (CC13)
ALT: 21 U/L (ref 0–55)
ANION GAP: 4 meq/L (ref 3–11)
AST: 28 U/L (ref 5–34)
Albumin: 2.5 g/dL — ABNORMAL LOW (ref 3.5–5.0)
Alkaline Phosphatase: 107 U/L (ref 40–150)
BILIRUBIN TOTAL: 0.25 mg/dL (ref 0.20–1.20)
BUN: 17.1 mg/dL (ref 7.0–26.0)
CALCIUM: 8.4 mg/dL (ref 8.4–10.4)
CO2: 26 mEq/L (ref 22–29)
Chloride: 107 mEq/L (ref 98–109)
Creatinine: 0.8 mg/dL (ref 0.7–1.3)
EGFR: >90 mL/min/{1.73_m2} (ref >90)
Glucose: 86 mg/dl (ref 70–140)
Potassium: 4.7 mEq/L (ref 3.5–5.1)
SODIUM: 137 meq/L (ref 136–145)
TOTAL PROTEIN: 5.8 g/dL — AB (ref 6.4–8.3)

## 2015-01-17 LAB — HOLD TUBE, BLOOD BANK

## 2015-01-17 LAB — PREPARE RBC (CROSSMATCH)

## 2015-01-17 MED ORDER — SODIUM CHLORIDE 0.9 % IJ SOLN
10.0000 mL | INTRAMUSCULAR | Status: AC | PRN
  Administered 2015-01-17: 10 mL
  Filled 2015-01-17: qty 10

## 2015-01-17 MED ORDER — HEPARIN SOD (PORK) LOCK FLUSH 100 UNIT/ML IV SOLN
500.0000 [IU] | Freq: Every day | INTRAVENOUS | Status: AC | PRN
  Administered 2015-01-17: 500 [IU]
  Filled 2015-01-17: qty 5

## 2015-01-17 MED ORDER — SODIUM CHLORIDE 0.9 % IV SOLN
250.0000 mL | Freq: Once | INTRAVENOUS | Status: AC
  Administered 2015-01-17: 250 mL via INTRAVENOUS

## 2015-01-17 NOTE — Progress Notes (Signed)
  [place] OFFICE PROGRESS NOTE   Principle Diagnosis: 59 year old gentleman with urothelial carcinoma presenting with large bladder mass, hydronephrosis and pelvic adenopathy in March 2016.   Prior Therapy: He is status post TURBT on 09/26/2014 pathology showed high-grade urothelial carcinoma.  Current therapy: Carboplatin and Gemzar palliative chemotherapy started on 12/13/2014. He is here for cycle 3 day 1.   INTERVAL HISTORY:   Mr. Nienhaus returns as scheduled. He completed cycle 2 carboplatin/gemcitabine beginning 12/27/2014. He received the day 8 treatment on 01/03/2015. He denies nausea/vomiting. No mouth sores. No diarrhea. No fever. No rash.  He was hospitalized 01/10/2015 through 01/13/2015 with increased hematuria, anemia. He was found to have pancytopenia. He received red cell and platelet transfusion support. He reports his urine is now clear.  Objective:  Vital signs in last 24 hours:  Blood pressure 106/68, pulse 60, temperature 97.9 F (36.6 C), temperature source Oral, resp. rate 20, SpO2 93 %.    HEENT: No thrush or ulcers. Resp: Lungs clear anteriorly. Cardio: Regular rate and rhythm. GI: Abdomen soft and nontender. Obese.  Vascular: No right leg edema. Status post left AKA. Port-A-Cath without erythema. Urine in the collection bag is clear.  Lab Results:  Lab Results  Component Value Date   WBC 4.1 01/17/2015   HGB 7.8* 01/17/2015   HCT 24.2* 01/17/2015   MCV 80.4 01/17/2015   PLT 418* 01/17/2015   NEUTROABS 1.8 01/17/2015    Imaging:  No results found.  Medications: I have reviewed the patient's current medications.  Assessment/Plan: 1. Urothelial carcinoma diagnosed in March 2016 after presenting with a low hydronephrosis, hematuria and acute renal failure. He underwent a TURBT on 09/26/2014 and found to have high-grade urothelial carcinoma. CT scan did show pelvic adenopathy and bilateral hydronephrosis associated with  that.  He is receiving systemic chemotherapy with carboplatin/gemcitabine. He has completed 2 cycles. He was scheduled to begin cycle 3 today. Dr. Alen Blew recommends holding today's treatment and rescheduling for one week due to the recent hospitalization. Restaging CT evaluation is planned after cycle 3.  2. IV access: Port-A-Cath placed without any complications.  3. Antiemetics:  Zofran as needed nausea medication.  4. History of pulmonary embolism: He is chronically anticoagulated with warfarin.  5. Anemia. He was recently hospitalized with hematuria and required transfusion support. He will receive 1 unit of packed red cells today.  5. Follow-up: One week with the next chemotherapy.  Plan reviewed with Dr. Alen Blew.   Ned Card ANP/GNP-BC   01/17/2015  4:41 PM

## 2015-01-17 NOTE — Patient Instructions (Signed)
Blood Transfusion Information WHAT IS A BLOOD TRANSFUSION? A transfusion is the replacement of blood or some of its parts. Blood is made up of multiple cells which provide different functions.  Red blood cells carry oxygen and are used for blood loss replacement.  White blood cells fight against infection.  Platelets control bleeding.  Plasma helps clot blood.  Other blood products are available for specialized needs, such as hemophilia or other clotting disorders. BEFORE THE TRANSFUSION  Who gives blood for transfusions?   You may be able to donate blood to be used at a later date on yourself (autologous donation).  Relatives can be asked to donate blood. This is generally not any safer than if you have received blood from a stranger. The same precautions are taken to ensure safety when a relative's blood is donated.  Healthy volunteers who are fully evaluated to make sure their blood is safe. This is blood bank blood. Transfusion therapy is the safest it has ever been in the practice of medicine. Before blood is taken from a donor, a complete history is taken to make sure that person has no history of diseases nor engages in risky social behavior (examples are intravenous drug use or sexual activity with multiple partners). The donor's travel history is screened to minimize risk of transmitting infections, such as malaria. The donated blood is tested for signs of infectious diseases, such as HIV and hepatitis. The blood is then tested to be sure it is compatible with you in order to minimize the chance of a transfusion reaction. If you or a relative donates blood, this is often done in anticipation of surgery and is not appropriate for emergency situations. It takes many days to process the donated blood. RISKS AND COMPLICATIONS Although transfusion therapy is very safe and saves many lives, the main dangers of transfusion include:   Getting an infectious disease.  Developing a  transfusion reaction. This is an allergic reaction to something in the blood you were given. Every precaution is taken to prevent this. The decision to have a blood transfusion has been considered carefully by your caregiver before blood is given. Blood is not given unless the benefits outweigh the risks. AFTER THE TRANSFUSION  Right after receiving a blood transfusion, you will usually feel much better and more energetic. This is especially true if your red blood cells have gotten low (anemic). The transfusion raises the level of the red blood cells which carry oxygen, and this usually causes an energy increase.  The nurse administering the transfusion will monitor you carefully for complications. HOME CARE INSTRUCTIONS  No special instructions are needed after a transfusion. You may find your energy is better. Speak with your caregiver about any limitations on activity for underlying diseases you may have. SEEK MEDICAL CARE IF:   Your condition is not improving after your transfusion.  You develop redness or irritation at the intravenous (IV) site. SEEK IMMEDIATE MEDICAL CARE IF:  Any of the following symptoms occur over the next 12 hours:  Shaking chills.  You have a temperature by mouth above 102 F (38.9 C), not controlled by medicine.  Chest, back, or muscle pain.  People around you feel you are not acting correctly or are confused.  Shortness of breath or difficulty breathing.  Dizziness and fainting.  You get a rash or develop hives.  You have a decrease in urine output.  Your urine turns a dark color or changes to pink, red, or brown. Any of the following   symptoms occur over the next 10 days:  You have a temperature by mouth above 102 F (38.9 C), not controlled by medicine.  Shortness of breath.  Weakness after normal activity.  The white part of the eye turns yellow (jaundice).  You have a decrease in the amount of urine or are urinating less often.  Your  urine turns a dark color or changes to pink, red, or brown. Document Released: 07/11/2000 Document Revised: 10/06/2011 Document Reviewed: 02/28/2008 [facility] Patient Information 2015 [place], [place]. This information is not intended to replace advice given to you by your health care provider. Make sure you discuss any questions you have with your health care provider.  No chemotherapy given today. Pt had one unit of blood.

## 2015-01-18 LAB — TYPE AND SCREEN
ABO/RH(D): O POS
Antibody Screen: NEGATIVE
UNIT DIVISION: 0

## 2015-01-21 NOTE — Progress Notes (Addendum)
Patient ID: Samuel [name], male   DOB: 08-Sep-1955, 59 y.o.   MRN: [id number]                HISTORY & PHYSICAL  DATE:  01/16/2015            FACILITY: [facility]                       LEVEL OF CARE:   SNF   CHIEF COMPLAINT:  Review, post stay at [facility], 01/10/2015 through 01/13/2015.     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  He has metastatic disease with a large left bladder tumor spreading across the trigon.  On CT scan, there is a large right external iliac node, worrisome for metastatic disease.  He follows with Dr. [name] of Urology and has also been followed by Dr. Osker Mason of Oncology.    Apparently after changing his catheter, he developed gross hematuria.  He saw Dr. [name].  His hemoglobin plummeted from 7.5 on 01/08/2015 to 6.8.  We, unfortunately, could not arrange outpatient transfusion.   Therefore, he had to be sent to the hospital.  Also of note, his platelet count was 500, probably related to chemotherapy.  In the hospital, I believe he received 2 U of blood.  His Coumadin was restarted.    He was seen by Oncology, is receiving carboplatin and gemcitabine.    He was also noted to have a multi-drug resistant Klebsiella UTI for which he had been started on Invanz.    LABORATORY DATA:  Lab work has been repeated on 01/15/2015:    CBC:  White count 4 with a reasonably normal differential, hemoglobin up to 8.8 post transfusion, platelet count now 304,000.    Comprehensive metabolic panel:   Albumin 3, BUN 10, creatinine 0.88.  AST is slightly elevated at 43.  However, his alk phos, ALT, and total bilirubin are all normal.    CURRENT MEDICATIONS:  Discharge medications are reviewed.      Tylenol q.6 p.r.n.       Maalox q.6 p.r.n.       Elavil 75 at bedtime.    Lipitor 10 q.d.     Vitamin D 50,000 U monthly.    Clonazepam 0.5 q.4 p.r.n.       Vitamin B12, 1000 daily.    Exelon 60  daily.    Invanz 1 g IM, ending two days ago.    Reglan 5 mg four times a day.    Lopressor 25 b.i.d.      Ditropan XL 5 mg q.a.m.        Oxycodone 5 mg q.4 p.r.n.       KCl 10 mEq b.i.d.      Lyrica 100 three times a day.    Phenergan 12.5 q.6.     Seroquel 300 b.i.d.        Systane ophthalmic.    Senokot 8.6, 2 tablets every morning.    Coumadin 3 mg daily.    REVIEW OF SYSTEMS:            CHEST/RESPIRATORY:  The patient does not have any complaints.       CARDIAC:  No chest pain.    GI:  No nausea, vomiting.    ASSESSMENT/PLAN:             Blood loss anemia, complicated by pancytopenia from chemotherapy.  All of  this has stabilized.  Follow-up CBC is ordered for 01/19/2015.  This is appropriate.    History of severe pulmonary embolism.  He had a filter at one point.  This has been removed.  He continues on Coumadin.  His INR was 1.7 yesterday.  He is on Coumadin 3 mg.  I would think that an IVC filter would be appropriate if the bleeding continues.  There were questions about the IVC filter being removed.  The issue was that this was before anything was known about the cancer or chemotherapy.  There was really no reason to maintain it.    Bipolar disorder.  If anything, this man is depressed at the moment.  I think this will need to be monitored.       Follow-up appointment with Dr. Osker Mason should be organized.

## 2015-01-23 ENCOUNTER — Non-Acute Institutional Stay (SKILLED_NURSING_FACILITY): Payer: Medicare Other | Admitting: Internal Medicine

## 2015-01-23 ENCOUNTER — Encounter: Payer: Self-pay | Admitting: *Deleted

## 2015-01-23 DIAGNOSIS — T2122XS Burn of second degree of abdominal wall, sequela: Secondary | ICD-10-CM

## 2015-01-23 DIAGNOSIS — D702 Other drug-induced agranulocytosis: Secondary | ICD-10-CM | POA: Diagnosis not present
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
## 2015-01-24 ENCOUNTER — Other Ambulatory Visit: Payer: Self-pay

## 2015-01-24 ENCOUNTER — Ambulatory Visit: Payer: Self-pay

## 2015-01-24 ENCOUNTER — Telehealth: Payer: Self-pay

## 2015-01-24 NOTE — Telephone Encounter (Signed)
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 Returning her call from 1138 am.  Pt has port a cath and they want to draw labs from it. Was asking about what size huber needle. Levada Dy stated they will be having an inservice on [place]. An inbasket was sent to Meade Maw, Retail buyer with this information.

## 2015-01-26 ENCOUNTER — Other Ambulatory Visit: Payer: Self-pay

## 2015-01-26 MED ORDER — CLONAZEPAM 0.5 MG PO TABS: ORAL_TABLET | ORAL | Status: DC

## 2015-01-26 NOTE — Telephone Encounter (Signed)
[facility] 

## 2015-01-30 ENCOUNTER — Other Ambulatory Visit: Payer: Self-pay

## 2015-01-30 ENCOUNTER — Ambulatory Visit (HOSPITAL_COMMUNITY): Admission: RE | Admit: 2015-01-30 | Payer: Medicare Other | Source: Ambulatory Visit

## 2015-01-31 ENCOUNTER — Encounter: Payer: Self-pay | Admitting: Oncology

## 2015-01-31 NOTE — Progress Notes (Signed)
R/S missed lab and CT scan prior to MD visit within next 2-3 weeks.  Sent POF to schedulers

## 2015-01-31 NOTE — Telephone Encounter (Signed)
Spoke with nurse at [employer]. They thought his appt had been cancelled. Per dr Alen Blew, r/s in 2-3 weeks, after CT scan.

## 2015-01-31 NOTE — Progress Notes (Signed)
This encounter was created in error - please disregard.

## 2015-02-06 ENCOUNTER — Non-Acute Institutional Stay (SKILLED_NURSING_FACILITY): Payer: Medicare Other | Admitting: Internal Medicine

## 2015-02-06 DIAGNOSIS — I2782 Chronic pulmonary embolism: Secondary | ICD-10-CM

## 2015-02-06 DIAGNOSIS — N390 Urinary tract infection, site not specified: Secondary | ICD-10-CM | POA: Diagnosis not present

## 2015-02-06 DIAGNOSIS — T8351XA Infection and inflammatory reaction due to indwelling urinary catheter, initial encounter: Secondary | ICD-10-CM

## 2015-02-06 DIAGNOSIS — R319 Hematuria, unspecified: Secondary | ICD-10-CM

## 2015-02-06 DIAGNOSIS — T83511A Infection and inflammatory reaction due to indwelling urethral catheter, initial encounter: Secondary | ICD-10-CM

## 2015-02-07 ENCOUNTER — Encounter: Payer: Self-pay | Admitting: *Deleted

## 2015-02-07 ENCOUNTER — Telehealth: Payer: Self-pay | Admitting: *Deleted

## 2015-02-07 NOTE — Telephone Encounter (Signed)
Late entry-----Patient's place of living called yesterday and today for appts. I have forwarded both appts to the desk RN.   JMW

## 2015-02-07 NOTE — Telephone Encounter (Signed)
Nurse Isha calling from nursing home to say that patient has MRSA and a UTI, he is on cipro and doxycycline. Isha wants to know if he should start back on his chemo here at the cancer center. Instructed her to call us back when his antibiotic regimen is complete and he is no longer positive for MRSA. Note to dr Hazeline Junker desk.
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
## 2015-02-15 ENCOUNTER — Other Ambulatory Visit: Payer: Self-pay

## 2015-02-15 MED ORDER — PREGABALIN 100 MG PO CAPS: 100.0000 mg | ORAL_CAPSULE | Freq: Three times a day (TID) | ORAL | Status: DC

## 2015-02-15 NOTE — Telephone Encounter (Signed)
Rx faxed to [facility] @ [phone], phone number [phone]  

## 2015-02-17 ENCOUNTER — Non-Acute Institutional Stay (SKILLED_NURSING_FACILITY): Payer: Medicare Other | Admitting: Internal Medicine

## 2015-02-17 ENCOUNTER — Inpatient Hospital Stay (HOSPITAL_COMMUNITY)
Admission: EM | Admit: 2015-02-17 | Discharge: 2015-02-22 | DRG: 853 | Disposition: A | Discharge status: Skilled Nursing Facility | Payer: Medicare Other | Attending: Internal Medicine | Admitting: Internal Medicine

## 2015-02-17 ENCOUNTER — Encounter (HOSPITAL_COMMUNITY): Payer: Self-pay | Admitting: *Deleted

## 2015-02-17 ENCOUNTER — Emergency Department (HOSPITAL_COMMUNITY): Payer: Medicare Other

## 2015-02-17 DIAGNOSIS — Z89612 Acquired absence of left leg above knee: Secondary | ICD-10-CM | POA: Diagnosis not present

## 2015-02-17 DIAGNOSIS — D509 Iron deficiency anemia, unspecified: Secondary | ICD-10-CM | POA: Diagnosis present

## 2015-02-17 DIAGNOSIS — N390 Urinary tract infection, site not specified: Secondary | ICD-10-CM

## 2015-02-17 DIAGNOSIS — Y95 Nosocomial condition: Secondary | ICD-10-CM | POA: Diagnosis present

## 2015-02-17 DIAGNOSIS — J189 Pneumonia, unspecified organism: Secondary | ICD-10-CM | POA: Diagnosis present

## 2015-02-17 DIAGNOSIS — K219 Gastro-esophageal reflux disease without esophagitis: Secondary | ICD-10-CM | POA: Diagnosis present

## 2015-02-17 DIAGNOSIS — N189 Chronic kidney disease, unspecified: Secondary | ICD-10-CM | POA: Diagnosis present

## 2015-02-17 DIAGNOSIS — C679 Malignant neoplasm of bladder, unspecified: Secondary | ICD-10-CM | POA: Diagnosis present

## 2015-02-17 DIAGNOSIS — G4700 Insomnia, unspecified: Secondary | ICD-10-CM | POA: Diagnosis present

## 2015-02-17 DIAGNOSIS — Z86711 Personal history of pulmonary embolism: Secondary | ICD-10-CM

## 2015-02-17 DIAGNOSIS — G8929 Other chronic pain: Secondary | ICD-10-CM | POA: Diagnosis present

## 2015-02-17 DIAGNOSIS — A498 Other bacterial infections of unspecified site: Secondary | ICD-10-CM | POA: Diagnosis not present

## 2015-02-17 DIAGNOSIS — G40909 Epilepsy, unspecified, not intractable, without status epilepticus: Secondary | ICD-10-CM | POA: Diagnosis present

## 2015-02-17 DIAGNOSIS — E669 Obesity, unspecified: Secondary | ICD-10-CM | POA: Diagnosis present

## 2015-02-17 DIAGNOSIS — Z7901 Long term (current) use of anticoagulants: Secondary | ICD-10-CM | POA: Diagnosis not present

## 2015-02-17 DIAGNOSIS — Z9049 Acquired absence of other specified parts of digestive tract: Secondary | ICD-10-CM | POA: Diagnosis present

## 2015-02-17 DIAGNOSIS — Z6837 Body mass index (BMI) 37.0-37.9, adult: Secondary | ICD-10-CM | POA: Diagnosis not present

## 2015-02-17 DIAGNOSIS — Z933 Colostomy status: Secondary | ICD-10-CM

## 2015-02-17 DIAGNOSIS — T50905A Adverse effect of unspecified drugs, medicaments and biological substances, initial encounter: Secondary | ICD-10-CM | POA: Diagnosis present

## 2015-02-17 DIAGNOSIS — G9340 Encephalopathy, unspecified: Secondary | ICD-10-CM

## 2015-02-17 DIAGNOSIS — I129 Hypertensive chronic kidney disease with stage 1 through stage 4 chronic kidney disease, or unspecified chronic kidney disease: Secondary | ICD-10-CM | POA: Diagnosis present

## 2015-02-17 DIAGNOSIS — R410 Disorientation, unspecified: Secondary | ICD-10-CM

## 2015-02-17 DIAGNOSIS — A491 Streptococcal infection, unspecified site: Secondary | ICD-10-CM | POA: Diagnosis present

## 2015-02-17 DIAGNOSIS — Z91018 Allergy to other foods: Secondary | ICD-10-CM | POA: Diagnosis not present

## 2015-02-17 DIAGNOSIS — I2699 Other pulmonary embolism without acute cor pulmonale: Secondary | ICD-10-CM | POA: Diagnosis present

## 2015-02-17 DIAGNOSIS — F3112 Bipolar disorder, current episode manic without psychotic features, moderate: Secondary | ICD-10-CM | POA: Diagnosis not present

## 2015-02-17 DIAGNOSIS — E785 Hyperlipidemia, unspecified: Secondary | ICD-10-CM | POA: Diagnosis present

## 2015-02-17 DIAGNOSIS — N179 Acute kidney failure, unspecified: Secondary | ICD-10-CM | POA: Diagnosis present

## 2015-02-17 DIAGNOSIS — R310 Gross hematuria: Secondary | ICD-10-CM | POA: Diagnosis present

## 2015-02-17 DIAGNOSIS — G8220 Paraplegia, unspecified: Secondary | ICD-10-CM | POA: Diagnosis present

## 2015-02-17 DIAGNOSIS — E876 Hypokalemia: Secondary | ICD-10-CM | POA: Diagnosis not present

## 2015-02-17 DIAGNOSIS — G92 Toxic encephalopathy: Secondary | ICD-10-CM | POA: Diagnosis present

## 2015-02-17 DIAGNOSIS — Z9981 Dependence on supplemental oxygen: Secondary | ICD-10-CM

## 2015-02-17 DIAGNOSIS — A419 Sepsis, unspecified organism: Secondary | ICD-10-CM | POA: Diagnosis present

## 2015-02-17 DIAGNOSIS — B952 Enterococcus as the cause of diseases classified elsewhere: Secondary | ICD-10-CM | POA: Diagnosis present

## 2015-02-17 DIAGNOSIS — Z87891 Personal history of nicotine dependence: Secondary | ICD-10-CM | POA: Diagnosis not present

## 2015-02-17 DIAGNOSIS — L89159 Pressure ulcer of sacral region, unspecified stage: Secondary | ICD-10-CM | POA: Diagnosis present

## 2015-02-17 DIAGNOSIS — L8990 Pressure ulcer of unspecified site, unspecified stage: Secondary | ICD-10-CM | POA: Diagnosis present

## 2015-02-17 DIAGNOSIS — B1920 Unspecified viral hepatitis C without hepatic coma: Secondary | ICD-10-CM | POA: Diagnosis present

## 2015-02-17 DIAGNOSIS — C689 Malignant neoplasm of urinary organ, unspecified: Secondary | ICD-10-CM

## 2015-02-17 DIAGNOSIS — F319 Bipolar disorder, unspecified: Secondary | ICD-10-CM | POA: Diagnosis present

## 2015-02-17 DIAGNOSIS — Z1621 Resistance to vancomycin: Secondary | ICD-10-CM | POA: Diagnosis present

## 2015-02-17 DIAGNOSIS — N1330 Unspecified hydronephrosis: Secondary | ICD-10-CM | POA: Diagnosis present

## 2015-02-17 DIAGNOSIS — G546 Phantom limb syndrome with pain: Secondary | ICD-10-CM | POA: Diagnosis present

## 2015-02-17 DIAGNOSIS — T83511A Infection and inflammatory reaction due to indwelling urethral catheter, initial encounter: Secondary | ICD-10-CM

## 2015-02-17 DIAGNOSIS — F419 Anxiety disorder, unspecified: Secondary | ICD-10-CM | POA: Diagnosis present

## 2015-02-17 DIAGNOSIS — R6521 Severe sepsis with septic shock: Secondary | ICD-10-CM | POA: Diagnosis present

## 2015-02-17 DIAGNOSIS — E2740 Unspecified adrenocortical insufficiency: Secondary | ICD-10-CM | POA: Diagnosis present

## 2015-02-17 DIAGNOSIS — A4102 Sepsis due to Methicillin resistant Staphylococcus aureus: Secondary | ICD-10-CM | POA: Diagnosis not present

## 2015-02-17 DIAGNOSIS — J9601 Acute respiratory failure with hypoxia: Secondary | ICD-10-CM | POA: Diagnosis present

## 2015-02-17 DIAGNOSIS — T451X5A Adverse effect of antineoplastic and immunosuppressive drugs, initial encounter: Secondary | ICD-10-CM

## 2015-02-17 DIAGNOSIS — T8351XA Infection and inflammatory reaction due to indwelling urinary catheter, initial encounter: Secondary | ICD-10-CM

## 2015-02-17 DIAGNOSIS — D6481 Anemia due to antineoplastic chemotherapy: Secondary | ICD-10-CM | POA: Diagnosis present

## 2015-02-17 LAB — BLOOD GAS, VENOUS
Acid-base deficit: 6.4 mmol/L — ABNORMAL HIGH (ref 0.0–2.0)
Bicarbonate: 18.5 mEq/L — ABNORMAL LOW (ref 20.0–24.0)
FIO2: 0.21 %
O2 Saturation: 65.9 %
PATIENT TEMPERATURE: 98.6
PCO2 VEN: 36.6 mmHg — AB (ref 45.0–50.0)
TCO2: 15.8 mmol/L (ref 0–100)
pH, Ven: 7.325 — ABNORMAL HIGH (ref 7.250–7.300)
pO2, Ven: 39.3 mmHg (ref 30.0–45.0)

## 2015-02-17 LAB — URINALYSIS, ROUTINE W REFLEX MICROSCOPIC
Bilirubin Urine: NEGATIVE
Glucose, UA: NEGATIVE mg/dL
KETONES UR: NEGATIVE mg/dL
NITRITE: NEGATIVE
Specific Gravity, Urine: 1.02 (ref 1.005–1.030)
UROBILINOGEN UA: 0.2 mg/dL (ref 0.0–1.0)
pH: 6.5 (ref 5.0–8.0)

## 2015-02-17 LAB — PROTIME-INR
INR: 3.42 — AB (ref 0.00–1.49)
Prothrombin Time: 33.8 seconds — ABNORMAL HIGH (ref 11.6–15.2)

## 2015-02-17 LAB — BASIC METABOLIC PANEL
Anion gap: 9 (ref 5–15)
BUN: 36 mg/dL — AB (ref 6–20)
CALCIUM: 7.9 mg/dL — AB (ref 8.9–10.3)
CO2: 21 mmol/L — ABNORMAL LOW (ref 22–32)
Chloride: 102 mmol/L (ref 101–111)
Creatinine, Ser: 2.33 mg/dL — ABNORMAL HIGH (ref 0.61–1.24)
GFR calc Af Amer: 34 mL/min — ABNORMAL LOW (ref >60)
GFR, EST NON AFRICAN AMERICAN: 29 mL/min — AB (ref >60)
GLUCOSE: 102 mg/dL — AB (ref 65–99)
Potassium: 3.7 mmol/L (ref 3.5–5.1)
SODIUM: 132 mmol/L — AB (ref 135–145)

## 2015-02-17 LAB — CBC WITH DIFFERENTIAL/PLATELET
Basophils Absolute: 0 10*3/uL (ref 0.0–0.1)
Basophils Relative: 0 % (ref 0–1)
Eosinophils Absolute: 0.3 10*3/uL (ref 0.0–0.7)
Eosinophils Relative: 2 % (ref 0–5)
HEMATOCRIT: 28 % — AB (ref 39.0–52.0)
HEMOGLOBIN: 9.2 g/dL — AB (ref 13.0–17.0)
LYMPHS PCT: 4 % — AB (ref 12–46)
Lymphs Abs: 0.7 10*3/uL (ref 0.7–4.0)
MCH: 27.3 pg (ref 26.0–34.0)
MCHC: 32.9 g/dL (ref 30.0–36.0)
MCV: 83.1 fL (ref 78.0–100.0)
MONOS PCT: 12 % (ref 3–12)
Monocytes Absolute: 2.1 10*3/uL — ABNORMAL HIGH (ref 0.1–1.0)
Neutro Abs: 13.9 10*3/uL — ABNORMAL HIGH (ref 1.7–7.7)
Neutrophils Relative %: 82 % — ABNORMAL HIGH (ref 43–77)
PLATELETS: 242 10*3/uL (ref 150–400)
RBC: 3.37 MIL/uL — AB (ref 4.22–5.81)
RDW: 19.8 % — ABNORMAL HIGH (ref 11.5–15.5)
WBC: 17 10*3/uL — ABNORMAL HIGH (ref 4.0–10.5)

## 2015-02-17 LAB — URINE MICROSCOPIC-ADD ON

## 2015-02-17 LAB — PROCALCITONIN: PROCALCITONIN: 27.72 ng/mL

## 2015-02-17 LAB — CORTISOL: Cortisol, Plasma: 33.9 ug/dL

## 2015-02-17 LAB — MRSA PCR SCREENING: MRSA by PCR: NEGATIVE

## 2015-02-17 LAB — I-STAT CG4 LACTIC ACID, ED: LACTIC ACID, VENOUS: 0.77 mmol/L (ref 0.5–2.0)

## 2015-02-17 MED ORDER — SODIUM CHLORIDE 0.9 % IJ SOLN
3.0000 mL | Freq: Two times a day (BID) | INTRAMUSCULAR | Status: DC
  Administered 2015-02-17 – 2015-02-21 (×4): 3 mL via INTRAVENOUS

## 2015-02-17 MED ORDER — LACTULOSE 10 GM/15ML PO SOLN
20.0000 g | Freq: Four times a day (QID) | ORAL | Status: DC
  Filled 2015-02-17: qty 30

## 2015-02-17 MED ORDER — LORAZEPAM 2 MG/ML IJ SOLN
0.5000 mg | INTRAMUSCULAR | Status: DC | PRN
  Administered 2015-02-17 – 2015-02-21 (×4): 0.5 mg via INTRAVENOUS
  Filled 2015-02-17 (×4): qty 1

## 2015-02-17 MED ORDER — NOREPINEPHRINE BITARTRATE 1 MG/ML IV SOLN
0.0000 ug/min | INTRAVENOUS | Status: DC
  Administered 2015-02-17: 2 ug/min via INTRAVENOUS
  Filled 2015-02-17: qty 4

## 2015-02-17 MED ORDER — LACTATED RINGERS IV BOLUS (SEPSIS)
1000.0000 mL | Freq: Once | INTRAVENOUS | Status: AC
  Administered 2015-02-17: 1000 mL via INTRAVENOUS

## 2015-02-17 MED ORDER — SODIUM CHLORIDE 0.9 % IV SOLN
INTRAVENOUS | Status: AC
  Administered 2015-02-17: 100 mL/h via INTRAVENOUS

## 2015-02-17 MED ORDER — METOPROLOL TARTRATE 1 MG/ML IV SOLN
5.0000 mg | INTRAVENOUS | Status: DC | PRN
Start: 2015-02-17 — End: 2015-02-17
  Administered 2015-02-17: 5 mg via INTRAVENOUS
  Filled 2015-02-17: qty 5

## 2015-02-17 MED ORDER — PIPERACILLIN-TAZOBACTAM 3.375 G IVPB 30 MIN
3.3750 g | Freq: Once | INTRAVENOUS | Status: AC
  Administered 2015-02-17: 3.375 g via INTRAVENOUS
  Filled 2015-02-17: qty 50

## 2015-02-17 MED ORDER — GUAIFENESIN 100 MG/5ML PO LIQD: 200.0000 mg | Freq: Three times a day (TID) | ORAL | Status: DC | PRN

## 2015-02-17 MED ORDER — SODIUM CHLORIDE 0.9 % IV BOLUS (SEPSIS)
500.0000 mL | Freq: Once | INTRAVENOUS | Status: AC
  Administered 2015-02-17: 500 mL via INTRAVENOUS

## 2015-02-17 MED ORDER — ACETAMINOPHEN 325 MG PO TABS
650.0000 mg | ORAL_TABLET | Freq: Four times a day (QID) | ORAL | Status: DC | PRN
  Administered 2015-02-17 – 2015-02-22 (×4): 650 mg via ORAL
  Filled 2015-02-17 (×4): qty 2

## 2015-02-17 MED ORDER — ACETAMINOPHEN 325 MG PO TABS: 650.0000 mg | ORAL_TABLET | Freq: Four times a day (QID) | ORAL | Status: DC | PRN

## 2015-02-17 MED ORDER — HYDRALAZINE HCL 20 MG/ML IJ SOLN
10.0000 mg | INTRAMUSCULAR | Status: DC | PRN
  Administered 2015-02-17: 10 mg via INTRAVENOUS
  Filled 2015-02-17: qty 1

## 2015-02-17 MED ORDER — BACLOFEN 5 MG HALF TABLET
5.0000 mg | ORAL_TABLET | Freq: Every evening | ORAL | Status: DC | PRN
  Administered 2015-02-17 – 2015-02-19 (×2): 5 mg via ORAL
  Filled 2015-02-17 (×4): qty 1

## 2015-02-17 MED ORDER — GUAIFENESIN 100 MG/5ML PO SOLN
10.0000 mL | Freq: Three times a day (TID) | ORAL | Status: DC | PRN
  Administered 2015-02-18 – 2015-02-22 (×8): 200 mg via ORAL
  Filled 2015-02-17: qty 20
  Filled 2015-02-17: qty 10
  Filled 2015-02-17: qty 20
  Filled 2015-02-17 (×5): qty 10

## 2015-02-17 MED ORDER — SODIUM CHLORIDE 0.9 % IV SOLN
INTRAVENOUS | Status: DC
  Administered 2015-02-17: 18:00:00 via INTRAVENOUS
  Administered 2015-02-18: 100 mL/h via INTRAVENOUS
  Administered 2015-02-18: 10:00:00 via INTRAVENOUS

## 2015-02-17 MED ORDER — BOOST PO LIQD
237.0000 mL | Freq: Two times a day (BID) | ORAL | Status: DC
  Administered 2015-02-20 – 2015-02-22 (×2): 237 mL via ORAL
  Filled 2015-02-17 (×10): qty 237

## 2015-02-17 MED ORDER — SODIUM CHLORIDE 0.9 % IJ SOLN
10.0000 mL | INTRAMUSCULAR | Status: DC | PRN
  Administered 2015-02-22 (×2): 10 mL
  Filled 2015-02-17 (×2): qty 40

## 2015-02-17 MED ORDER — SODIUM CHLORIDE 0.9 % IV BOLUS (SEPSIS)
1000.0000 mL | INTRAVENOUS | Status: AC
  Administered 2015-02-17 (×2): 1000 mL via INTRAVENOUS

## 2015-02-17 MED ORDER — PANTOPRAZOLE SODIUM 40 MG PO TBEC
40.0000 mg | DELAYED_RELEASE_TABLET | Freq: Every day | ORAL | Status: DC
  Administered 2015-02-18 – 2015-02-22 (×5): 40 mg via ORAL
  Filled 2015-02-17 (×5): qty 1

## 2015-02-17 MED ORDER — SODIUM CHLORIDE 0.9 % IV SOLN
2.0000 g | Freq: Two times a day (BID) | INTRAVENOUS | Status: DC
  Administered 2015-02-17: 2 g via INTRAVENOUS
  Filled 2015-02-17 (×2): qty 2

## 2015-02-17 MED ORDER — VANCOMYCIN HCL IN DEXTROSE 1-5 GM/200ML-% IV SOLN
1000.0000 mg | Freq: Once | INTRAVENOUS | Status: AC
  Administered 2015-02-17: 1000 mg via INTRAVENOUS
  Filled 2015-02-17: qty 200

## 2015-02-17 MED ORDER — HYDRALAZINE HCL 20 MG/ML IJ SOLN
10.0000 mg | INTRAMUSCULAR | Status: DC | PRN
  Filled 2015-02-17: qty 1

## 2015-02-17 MED ORDER — CLONAZEPAM 0.5 MG PO TABS
0.5000 mg | ORAL_TABLET | Freq: Two times a day (BID) | ORAL | Status: DC | PRN
  Administered 2015-02-20 – 2015-02-21 (×2): 0.5 mg via ORAL
  Filled 2015-02-17 (×2): qty 1

## 2015-02-17 MED ORDER — METOPROLOL TARTRATE 25 MG PO TABS
25.0000 mg | ORAL_TABLET | Freq: Two times a day (BID) | ORAL | Status: DC
  Administered 2015-02-17: 25 mg via ORAL
  Filled 2015-02-17: qty 1

## 2015-02-17 MED ORDER — ACETAMINOPHEN 650 MG RE SUPP: 650.0000 mg | Freq: Four times a day (QID) | RECTAL | Status: DC | PRN

## 2015-02-17 MED ORDER — PHENYLEPHRINE 40 MCG/ML (10ML) SYRINGE FOR IV PUSH (FOR BLOOD PRESSURE SUPPORT): 160.0000 ug | PREFILLED_SYRINGE | Freq: Once | INTRAVENOUS | Status: DC

## 2015-02-17 MED ORDER — SODIUM CHLORIDE 0.9 % IV BOLUS (SEPSIS)
250.0000 mL | INTRAVENOUS | Status: AC
  Administered 2015-02-17: 250 mL via INTRAVENOUS

## 2015-02-17 MED ORDER — NITROGLYCERIN 0.4 MG SL SUBL: 0.4000 mg | SUBLINGUAL_TABLET | SUBLINGUAL | Status: DC | PRN
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
## 2015-02-17 NOTE — ED Notes (Signed)
Bed: RJ73 Expected date: 02/17/15 Expected time: 1:27 PM Means of arrival: Ambulance Comments: Code sepsis

## 2015-02-17 NOTE — Progress Notes (Signed)
Pt refused arterial blood gas. Explained importance of  Procedure. pt still refused.

## 2015-02-17 NOTE — ED Notes (Signed)
Pt is refusing second set of BC draw. He sts he will only allow to have blood taken form his port but will not allow anyone to stick him. Dr Tamera Punt notified.
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
## 2015-02-17 NOTE — ED Notes (Signed)
Per EMS pt coming from [place] with c/o lethargy since yesterday as well as possible sepsis. EMS sts nursing home reports pt was recently diagnosed with UTI which was treated with abx, also hematuria since March, lung sounds are diminished with rhonchi; pt sats on RA are 86%, per EMS he was on 3L O2 via n/c at nursing home.
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
## 2015-02-17 NOTE — ED Notes (Signed)
Admitting MD at bedside.

## 2015-02-17 NOTE — ED Notes (Signed)
Pt refused arterial blood draw, Dr Verlon Au made aware.

## 2015-02-18 ENCOUNTER — Inpatient Hospital Stay (HOSPITAL_COMMUNITY): Payer: Medicare Other

## 2015-02-18 DIAGNOSIS — F3112 Bipolar disorder, current episode manic without psychotic features, moderate: Secondary | ICD-10-CM

## 2015-02-18 DIAGNOSIS — A419 Sepsis, unspecified organism: Secondary | ICD-10-CM

## 2015-02-18 LAB — CBC
HCT: 25.5 % — ABNORMAL LOW (ref 39.0–52.0)
HEMATOCRIT: 25 % — AB (ref 39.0–52.0)
HEMOGLOBIN: 8.3 g/dL — AB (ref 13.0–17.0)
HEMOGLOBIN: 8.1 g/dL — AB (ref 13.0–17.0)
MCH: 26.6 pg (ref 26.0–34.0)
MCH: 27.2 pg (ref 26.0–34.0)
MCHC: 32.5 g/dL (ref 30.0–36.0)
MCHC: 32.4 g/dL (ref 30.0–36.0)
MCV: 82 fL (ref 78.0–100.0)
MCV: 83.6 fL (ref 78.0–100.0)
Platelets: 190 10*3/uL (ref 150–400)
Platelets: 199 10*3/uL (ref 150–400)
RBC: 3.05 MIL/uL — ABNORMAL LOW (ref 4.22–5.81)
RBC: 3.05 MIL/uL — AB (ref 4.22–5.81)
RDW: 19.6 % — ABNORMAL HIGH (ref 11.5–15.5)
RDW: 19.5 % — AB (ref 11.5–15.5)
WBC: 17.8 10*3/uL — ABNORMAL HIGH (ref 4.0–10.5)
WBC: 16.5 10*3/uL — ABNORMAL HIGH (ref 4.0–10.5)

## 2015-02-18 LAB — COMPREHENSIVE METABOLIC PANEL
ALBUMIN: 2.2 g/dL — AB (ref 3.5–5.0)
ALBUMIN: 2.2 g/dL — AB (ref 3.5–5.0)
ALK PHOS: 66 U/L (ref 38–126)
ALK PHOS: 69 U/L (ref 38–126)
ALT: 8 U/L — ABNORMAL LOW (ref 17–63)
ALT: 8 U/L — AB (ref 17–63)
AST: 17 U/L (ref 15–41)
AST: 16 U/L (ref 15–41)
Anion gap: 9 (ref 5–15)
Anion gap: 9 (ref 5–15)
BILIRUBIN TOTAL: 0.7 mg/dL (ref 0.3–1.2)
BUN: 36 mg/dL — ABNORMAL HIGH (ref 6–20)
BUN: 36 mg/dL — ABNORMAL HIGH (ref 6–20)
CHLORIDE: 106 mmol/L (ref 101–111)
CHLORIDE: 107 mmol/L (ref 101–111)
CO2: 18 mmol/L — ABNORMAL LOW (ref 22–32)
CO2: 18 mmol/L — AB (ref 22–32)
Calcium: 7.3 mg/dL — ABNORMAL LOW (ref 8.9–10.3)
Calcium: 7.1 mg/dL — ABNORMAL LOW (ref 8.9–10.3)
Creatinine, Ser: 2.4 mg/dL — ABNORMAL HIGH (ref 0.61–1.24)
Creatinine, Ser: 2.64 mg/dL — ABNORMAL HIGH (ref 0.61–1.24)
GFR calc Af Amer: 33 mL/min — ABNORMAL LOW (ref >60)
GFR calc Af Amer: 29 mL/min — ABNORMAL LOW (ref >60)
GFR calc non Af Amer: 28 mL/min — ABNORMAL LOW (ref >60)
GFR calc non Af Amer: 25 mL/min — ABNORMAL LOW (ref >60)
Glucose, Bld: 120 mg/dL — ABNORMAL HIGH (ref 65–99)
Glucose, Bld: 125 mg/dL — ABNORMAL HIGH (ref 65–99)
POTASSIUM: 3.7 mmol/L (ref 3.5–5.1)
POTASSIUM: 3.5 mmol/L (ref 3.5–5.1)
SODIUM: 134 mmol/L — AB (ref 135–145)
Sodium: 133 mmol/L — ABNORMAL LOW (ref 135–145)
Total Bilirubin: 0.7 mg/dL (ref 0.3–1.2)
Total Protein: 5.8 g/dL — ABNORMAL LOW (ref 6.5–8.1)
Total Protein: 5.8 g/dL — ABNORMAL LOW (ref 6.5–8.1)

## 2015-02-18 LAB — PROTIME-INR
INR: 3.54 — ABNORMAL HIGH (ref 0.00–1.49)
INR: 3.62 — ABNORMAL HIGH (ref 0.00–1.49)
PROTHROMBIN TIME: 34.7 s — AB (ref 11.6–15.2)
Prothrombin Time: 35.3 seconds — ABNORMAL HIGH (ref 11.6–15.2)

## 2015-02-18 LAB — CK: CK TOTAL: 148 U/L (ref 49–397)

## 2015-02-18 LAB — APTT: aPTT: 52 seconds — ABNORMAL HIGH (ref 24–37)

## 2015-02-18 LAB — TYPE AND SCREEN
ABO/RH(D): O POS
ANTIBODY SCREEN: NEGATIVE

## 2015-02-18 LAB — LACTIC ACID, PLASMA: LACTIC ACID, VENOUS: 1.1 mmol/L (ref 0.5–2.0)

## 2015-02-18 LAB — PROCALCITONIN: PROCALCITONIN: 22.2 ng/mL

## 2015-02-18 MED ORDER — PIPERACILLIN-TAZOBACTAM 3.375 G IVPB
3.3750 g | Freq: Three times a day (TID) | INTRAVENOUS | Status: DC
  Administered 2015-02-18 – 2015-02-21 (×9): 3.375 g via INTRAVENOUS
  Filled 2015-02-18 (×11): qty 50

## 2015-02-18 MED ORDER — SILVER SULFADIAZINE 1 % EX CREA
TOPICAL_CREAM | Freq: Two times a day (BID) | CUTANEOUS | Status: DC
  Administered 2015-02-18: 1 via TOPICAL
  Administered 2015-02-19 – 2015-02-22 (×6): via TOPICAL
  Filled 2015-02-18: qty 85

## 2015-02-18 MED ORDER — PIPERACILLIN-TAZOBACTAM 3.375 G IVPB 30 MIN
3.3750 g | Freq: Once | INTRAVENOUS | Status: AC
  Administered 2015-02-18: 3.375 g via INTRAVENOUS
  Filled 2015-02-18 (×2): qty 50

## 2015-02-18 MED ORDER — POLYVINYL ALCOHOL 1.4 % OP SOLN
1.0000 [drp] | OPHTHALMIC | Status: DC | PRN
  Administered 2015-02-18 – 2015-02-20 (×3): 1 [drp] via OPHTHALMIC
  Filled 2015-02-18: qty 15

## 2015-02-18 MED ORDER — LAMOTRIGINE 100 MG PO TABS
100.0000 mg | ORAL_TABLET | Freq: Two times a day (BID) | ORAL | Status: DC
  Administered 2015-02-18 – 2015-02-22 (×8): 100 mg via ORAL
  Filled 2015-02-18 (×10): qty 1

## 2015-02-18 MED ORDER — IOHEXOL 300 MG/ML  SOLN: 50.0000 mL | Freq: Once | INTRAMUSCULAR | Status: AC | PRN

## 2015-02-18 MED ORDER — OXYCODONE HCL 5 MG PO TABS
5.0000 mg | ORAL_TABLET | Freq: Four times a day (QID) | ORAL | Status: DC | PRN
Start: 2015-02-18 — End: 2015-02-22
  Administered 2015-02-18 – 2015-02-22 (×12): 5 mg via ORAL
  Filled 2015-02-18 (×12): qty 1

## 2015-02-18 MED ORDER — QUETIAPINE FUMARATE 100 MG PO TABS
100.0000 mg | ORAL_TABLET | Freq: Every day | ORAL | Status: DC
  Administered 2015-02-18 – 2015-02-21 (×4): 100 mg via ORAL
  Filled 2015-02-18 (×5): qty 1

## 2015-02-18 MED ORDER — WARFARIN - PHARMACIST DOSING INPATIENT: Freq: Every day | Status: DC

## 2015-02-18 MED ORDER — TRAZODONE HCL 50 MG PO TABS
50.0000 mg | ORAL_TABLET | Freq: Every day | ORAL | Status: DC
  Administered 2015-02-18 – 2015-02-21 (×4): 50 mg via ORAL
  Filled 2015-02-18 (×6): qty 1

## 2015-02-18 MED ORDER — VANCOMYCIN HCL 10 G IV SOLR
1250.0000 mg | INTRAVENOUS | Status: DC
  Administered 2015-02-18 – 2015-02-19 (×2): 1250 mg via INTRAVENOUS
  Filled 2015-02-18 (×3): qty 1250

## 2015-02-18 NOTE — Consult Note (Signed)
WOC wound consult note Reason for Consult: Wounds. Has been seen by other [facility] [place] team members as well as myself over the past 2 years, most recently in May of this year. Wound type: Pressure (medial device related skin injury), moisture (MASD) Pressure Ulcer POA: Yes (Previously healed Stage IV pressure Injury; myocutaneous flap performed several years ago in [place], [place]) Measurement: New medical device related pressure injury (MDRPI) on right buttock, Stage: Deep tissue pressure injury (DTPI).  Two areas of 3cm round tissue injury with outer "rim" presenting as purple/maroon; center is clear.  The superior area is darker in hue than the distal area. Bilateral medial thighs with full thickness tissue loss, L>R.  Left area with two distinct wounds measures 14cm x 1.5cm and overall depth is 0.2cm.  40% of wound bed is light yellow slough, 60% is pink and granulating.  Right area with two distinct wounds measures 6cm x 3cm and overall depth is 0.2cm.  50% of wound bed is light yellow slough, 50% is pink and granulating. Etiology for these wounds is not known; presentation is consistent with moisture plus pressure from either sitting at too high an angle for prolonged periods with scrotum pressing (or perhaps a diaper or other padding pressing) against the thighs. There is evidence of previous wound healing in this area (scarring). Wound bed:As described above. Drainage (amount, consistency, odor) Light yellow exudate from inner thigh full thickness wounds, no drainage from MDRPI on  Periwound:As described above.  The myocutaneous flap is healed; other periwound areas are intact. Dressing procedure/placement/frequency: I will today implement a soft silicone foam dressing at the sacrum which will incorporate the two circular MDRPI wounds. I have provided guidance for nursing to have patient turn and reposition from side to side and avoid the supine position. I have provided wound care orders for the bilateral inner  thigh lesions using silver sulfadiazine twice daily and added an antimicrobial textile (Inter[employer] Ag+) for the scrotal area to wick moisture.  WOC ostomy consult note Stoma type/location: LLQ Colostomy Stomal assessment/size: Approximately 1 and 5/8 inch round, pink, moist and protruding (but not prolapsed) Peristomal assessment: Not seen today.  Patient reports that skin barrier was placed immediatly prior to transfer to hospital Treatment options for stomal/peristomal skin: None indicated Output None today Ostomy pouching: 2pc. Patient wears a closed end pouch on top of the 2 and 1/4 inch Conva[employer]ec skin barrier. He has 12 closed end pouches in his personal belongings.  If he requires a pouching change, orders are provided for that care. Education provided: Patient's pouch was not completely snapped onto the skin barrier at time of assessment.  He reports recently "butping" the pouch to release flatus.  He is reminded of the importance of securing the pouching system tot he skin barrier and to ask for assistance if needed.  He indicates understanding of this education. Enrolled patient in [facility] program: No  WOC nursing team will not follow, but will remain available to this patient, the nursing and medical teams.  Please re-consult if needed. Thanks, Maudie Flakes, MSN, RN, [place], [place], [place] [phone])
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
## 2015-02-18 NOTE — Progress Notes (Addendum)
Pt. A,A & Disortiented. speech is disorganized and unintelligible at times. He follows commands. PERRL. Pt is hemodynamically unstable. At one point SBP was less than 80 and MAP was less than 65 notwithstanding a 2.5liter bolus of NS and 1 liter bolus of Lactated Ringers. Levophed started per MD order. Pt came to [facility] ICU with foley. Existing foley discontinued per Md order. 44fr Foley catheter inserted. Pt is anuric. Was bladder scanned. Bladder scanner showed 83ml Urine. Notified eLink Md of Pt's status. No changes made at this time. No new orders. Will continue to monitor.
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
## 2015-02-19 ENCOUNTER — Ambulatory Visit (HOSPITAL_COMMUNITY): Payer: Medicare Other

## 2015-02-19 ENCOUNTER — Inpatient Hospital Stay (HOSPITAL_COMMUNITY): Payer: Medicare Other

## 2015-02-19 ENCOUNTER — Other Ambulatory Visit: Payer: Self-pay | Admitting: *Deleted

## 2015-02-19 ENCOUNTER — Other Ambulatory Visit: Payer: Self-pay | Admitting: Urology

## 2015-02-19 DIAGNOSIS — L8990 Pressure ulcer of unspecified site, unspecified stage: Secondary | ICD-10-CM | POA: Diagnosis present

## 2015-02-19 DIAGNOSIS — C688 Malignant neoplasm of overlapping sites of urinary organs: Secondary | ICD-10-CM

## 2015-02-19 DIAGNOSIS — I2699 Other pulmonary embolism without acute cor pulmonale: Secondary | ICD-10-CM

## 2015-02-19 DIAGNOSIS — J9601 Acute respiratory failure with hypoxia: Secondary | ICD-10-CM

## 2015-02-19 DIAGNOSIS — B182 Chronic viral hepatitis C: Secondary | ICD-10-CM

## 2015-02-19 LAB — PROTIME-INR
INR: 3.08 — ABNORMAL HIGH (ref 0.00–1.49)
PROTHROMBIN TIME: 31.2 s — AB (ref 11.6–15.2)

## 2015-02-19 LAB — CBC
HCT: 27.1 % — ABNORMAL LOW (ref 39.0–52.0)
HEMOGLOBIN: 8.8 g/dL — AB (ref 13.0–17.0)
MCH: 27 pg (ref 26.0–34.0)
MCHC: 32.5 g/dL (ref 30.0–36.0)
MCV: 83.1 fL (ref 78.0–100.0)
Platelets: 194 10*3/uL (ref 150–400)
RBC: 3.26 MIL/uL — ABNORMAL LOW (ref 4.22–5.81)
RDW: 19.8 % — AB (ref 11.5–15.5)
WBC: 9.6 10*3/uL (ref 4.0–10.5)

## 2015-02-19 LAB — BASIC METABOLIC PANEL
Anion gap: 9 (ref 5–15)
BUN: 31 mg/dL — AB (ref 6–20)
CHLORIDE: 110 mmol/L (ref 101–111)
CO2: 17 mmol/L — ABNORMAL LOW (ref 22–32)
Calcium: 7.3 mg/dL — ABNORMAL LOW (ref 8.9–10.3)
Creatinine, Ser: 2.89 mg/dL — ABNORMAL HIGH (ref 0.61–1.24)
GFR calc Af Amer: 26 mL/min — ABNORMAL LOW (ref >60)
GFR calc non Af Amer: 23 mL/min — ABNORMAL LOW (ref >60)
Glucose, Bld: 95 mg/dL (ref 65–99)
POTASSIUM: 3.7 mmol/L (ref 3.5–5.1)
Sodium: 136 mmol/L (ref 135–145)

## 2015-02-19 LAB — PROCALCITONIN: Procalcitonin: 11.01 ng/mL

## 2015-02-19 MED ORDER — CLONAZEPAM 0.5 MG PO TABS
ORAL_TABLET | ORAL | Status: DC
Start: 2015-02-19 — End: 2015-02-22

## 2015-02-19 NOTE — Progress Notes (Signed)
ANTICOAGULATION CONSULT NOTE - Follow Up Consult  Pharmacy Consult for warfarin Indication: Hx PE/DVT  Allergies  Allergen Reactions  . Tomato Other (See Comments)    Causes acid reflux    Patient Measurements: Height: 5\' 6"  (167.6 cm) Weight: 247 lb 5.7 oz (112.2 kg) IBW/kg (Calculated) : 63.8  Vital Signs: Temp: 98.7 F (37.1 C) (07/25 0800) Temp Source: Oral (07/25 0800) BP: 131/106 mmHg (07/25 0900) Pulse Rate: 104 (07/25 0900)  Labs:  Recent Labs  02/18/15 0020 02/18/15 0025 02/18/15 0335 02/19/15 0412 02/19/15 0825  HGB  --  8.1* 8.3* 8.8*  --   HCT  --  25.0* 25.5* 27.1*  --   PLT  --  190 199 194  --   APTT  --  52*  --   --   --   LABPROT  --  34.7* 35.3* 31.2*  --   INR  --  3.54* 3.62* 3.08*  --   CREATININE  --  2.40* 2.64*  --  2.89*  CKTOTAL 148  --   --   --   --     Estimated Creatinine Clearance: 33.2 mL/min (by C-G formula based on Cr of 2.89).   Medications:  Prescriptions prior to admission  Medication Sig Dispense Refill Last Dose  . acetaminophen (TYLENOL) 325 MG tablet Take 650 mg by mouth every 6 (six) hours as needed for moderate pain.   02/03/2015 at Unknown time  . alum & mag hydroxide-simeth (MAALOX PLUS) 400-400-40 MG/5ML suspension Take 20 mLs by mouth every 6 (six) hours as needed for indigestion.   PRN  . atorvastatin (LIPITOR) 10 MG tablet Take 10 mg by mouth daily at 6 PM.   02/16/2015 at Unknown time  . baclofen (LIORESAL) 10 MG tablet Take 5 mg by mouth at bedtime as needed for muscle spasms.   PRN  . Cholecalciferol 50000 UNITS capsule Take 50,000 Units by mouth every 30 (thirty) days.   02/07/2015  . cyanocobalamin 1000 MCG tablet Take 100 mcg by mouth daily.    02/17/2015 at Unknown time  . dexlansoprazole (DEXILANT) 60 MG capsule Take 60 mg by mouth daily.   02/17/2015 at Unknown time  . docusate sodium (COLACE) 100 MG capsule Take 100 mg by mouth 2 (two) times daily.   02/17/2015 at Unknown time  . doxycycline (VIBRA-TABS) 100  MG tablet Take 100 mg by mouth 2 (two) times daily. For 14 days   02/16/2015 at Unknown time  . famotidine (PEPCID) 20 MG tablet Take 20 mg by mouth at bedtime.   02/16/2015 at Unknown time  . ferrous sulfate 325 (65 FE) MG EC tablet Take 325 mg by mouth 2 (two) times daily.   02/17/2015 at Unknown time  . folic acid (FOLVITE) 1 MG tablet Take 1 mg by mouth daily.   02/17/2015 at Unknown time  . guaiFENesin (ROBITUSSIN) 100 MG/5ML liquid Take 300 mg by mouth 3 (three) times daily as needed for cough.    PRN  . lactose free nutrition (BOOST) LIQD Take 237 mLs by mouth 2 (two) times daily between meals.   02/17/2015 at Unknown time  . lactulose (CHRONULAC) 10 GM/15ML solution Take 20 g by mouth 4 (four) times daily.   02/17/2015 at Unknown time  . lamoTRIgine (LAMICTAL) 200 MG tablet Take 200 mg by mouth 2 (two) times daily.   02/17/2015 at Unknown time  . lidocaine (LIDODERM) 5 % Place 2 patches onto the skin as needed (for pain). Remove & Discard  patch within 12 hours or as directed by MD (apply 2 patches to left stump daily at 9pm   02/17/2015 at Unknown time  . metoCLOPramide (REGLAN) 5 MG tablet Take 5 mg by mouth 4 (four) times daily.   02/17/2015 at Unknown time  . metoprolol tartrate (LOPRESSOR) 25 MG tablet Take 1 tablet (25 mg total) by mouth 2 (two) times daily. 60 tablet 1 02/17/2015 at 0900  . nitroGLYCERIN (NITROSTAT) 0.4 MG SL tablet Place 0.4 mg under the tongue every 5 (five) minutes as needed for chest pain.   PRN  . ondansetron (ZOFRAN) 4 MG tablet Take 4 mg by mouth every 8 (eight) hours as needed for nausea or vomiting.   PRN  . ondansetron (ZOFRAN-ODT) 8 MG disintegrating tablet Take 8 mg by mouth every 8 (eight) hours as needed for nausea or vomiting.   PRN  . oxybutynin (DITROPAN-XL) 5 MG 24 hr tablet Take 5 mg by mouth every morning.   02/17/2015 at Unknown time  . oxyCODONE (OXY IR/ROXICODONE) 5 MG immediate release tablet Take 1 tablet (5 mg total) by mouth every 4 (four) hours as needed  for severe pain. 30 tablet 0 02/16/2015 at Unknown time  . Polyethyl Glycol-Propyl Glycol (SYSTANE) 0.4-0.3 % SOLN Apply 2 drops to eye 3 (three) times daily.   02/17/2015 at Unknown time  . pregabalin (LYRICA) 100 MG capsule Take 1 capsule (100 mg total) by mouth 3 (three) times daily. 90 capsule 5 02/17/2015 at Unknown time  . promethazine (PHENERGAN) 12.5 MG tablet Take 12.5 mg by mouth every 6 (six) hours as needed for nausea or vomiting.   02/13/2015  . QUEtiapine (SEROQUEL) 300 MG tablet Take 300 mg by mouth 2 (two) times daily.   02/17/2015 at Unknown time  . senna (SENOKOT) 8.6 MG tablet Take 2 tablets by mouth every morning.   02/17/2015 at Unknown time  . Sodium Chloride Flush (NORMAL SALINE FLUSH) 0.9 % SOLN Inject 10 mLs into the vein See admin instructions. For flushing porta cath every 4 weeks, when port not in use   Unknown  . warfarin (COUMADIN) 1 MG tablet Take 1 mg by mouth as directed.     . warfarin (COUMADIN) 2.5 MG tablet Take 2.5 mg by mouth as directed.     . warfarin (COUMADIN) 3 MG tablet Take 3 mg by mouth as directed.    on hold  . amitriptyline (ELAVIL) 75 MG tablet Take 75 mg by mouth at bedtime.   02/15/2015  . mupirocin ointment (BACTROBAN) 2 % Place 1 application into the nose 2 (two) times daily. Through 01/16/2015 (Patient not taking: Reported on 02/17/2015) 22 g 0    Scheduled:  . lactose free nutrition  237 mL Oral BID BM  . lamoTRIgine  100 mg Oral BID  . pantoprazole  40 mg Oral Daily  . piperacillin-tazobactam (ZOSYN)  IV  3.375 g Intravenous Q8H  . QUEtiapine  100 mg Oral QHS  . silver sulfADIAZINE   Topical BID  . sodium chloride  3 mL Intravenous Q12H  . traZODone  50 mg Oral QHS  . vancomycin  1,250 mg Intravenous Q24H  . Warfarin - Pharmacist Dosing Inpatient   Does not apply q1800   Assessment: 59yo M w/ sepsis, suspected healthcare associated pneumonia/aspiration pneumonia, hx of UTI ESBL, toxic metabolic encephalopathy. Pharmacy is asked to dose his  Coumadin while inpatient.  Undergoing chemo for bladder cancer; hematuria noted recently   Baseline INR supratherapeutic on admission  Prior anticoagulation:  does not have a scheduled regimen; doses have been 3-3.5 mg recently with dose held on 7/22  Significant events:  Today, 02/19/2015:  CBC: Hgb low but stable from yesterday  INR slightly supratherapeutic  Major drug interactions: none noted  Ongoing hematuria noted with catheter in place  Clear liquid diet  Goal of Therapy: INR 2-3  Plan:  Hold warfarin tonight; resume when INR < 3.0.  Close to therapeutic today, but given concomitant acute illness and hematuria, will wait for true therapeutic INR to resume warfarin.  Daily INR  CBC at least q72 hr while on warfarin  Monitor for signs of worsening hematuria or other bleeding   Reuel Boom, PharmD Pager: [phone] 02/19/2015, 10:37 AM

## 2015-02-19 NOTE — Progress Notes (Signed)
Date:  February 19, 2015 U.R. performed for needs and level of care. Transferred out of sdu to telemetry floor Will continue to follow for Case Management needs.  Velva Harman, RN, BSN, [place]   [phone]
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
## 2015-02-19 NOTE — Telephone Encounter (Signed)
[facility] 

## 2015-02-19 NOTE — Care Management Important Message (Signed)
Important Message  Patient Details IM Letter given to [facility]? RN Case Manager to present to patientImportant Message  Patient Details  Name: Zayin Valadez MRN: [id number] Date of Birth: 04-06-1956   Medicare Important Message Given:  Yes-second notification given    Camillo Flaming 02/19/2015, 12:17 PM Name: Nizar Cutler MRN: [id number] Date of Birth: Jan 11, 1956   Medicare Important Message Given:  Yes-second notification given    Camillo Flaming 02/19/2015, 12:16 PM

## 2015-02-19 NOTE — Progress Notes (Signed)
PULMONARY / CRITICAL CARE MEDICINE   Name: Samuel [name] MRN: [id number] DOB: [birth year]    ADMISSION DATE:  02/17/2015 CONSULTATION DATE:  02/17/2015  REFERRING MD :  Dr. Verlon Au  CHIEF COMPLAINT:  Sepsis  BRIEF SUMMARY:  66M with complex medical history to include multiple Kebsiella CAUTI with ESBL, High grade urothelial CA on chemo, seizure d/o, IVC on anticoagulation.  Admitted 7/23 for confusion and AMS.  Found to have concerns for sepsis and PCCM consulted 7/24 for evaluation.     KEY EVENTS / STUDIES: 7/23  Admit with sepsis, concern for UTI +/- LLL infiltrate 7/25  Resolving sepsis, tx out of ICU per primary SVC   SUBJECTIVE:  VSS, resolving WBC, improved mentation, tolerating diet   VITAL SIGNS: Temp:  [97.7 F (36.5 C)-98.7 F (37.1 C)] 98.7 F (37.1 C) (07/25 0800) Pulse Rate:  [45-126] 104 (07/25 0900) Resp:  [19-34] 23 (07/25 0900) BP: (91-181)/(52-131) 131/106 mmHg (07/25 0900) SpO2:  [83 %-100 %] 93 % (07/25 0900) Weight:  [247 lb 5.7 oz (112.2 kg)] 247 lb 5.7 oz (112.2 kg) (07/25 0236)   INTAKE / OUTPUT:  Intake/Output Summary (Last 24 hours) at 02/19/15 1044 Last data filed at 02/19/15 0800  Gross per 24 hour  Intake   2550 ml  Output    785 ml  Net   1765 ml    PHYSICAL EXAMINATION: General:  Chronically ill adult male in NAD Neuro:  Awake, alert, MAE.  Follows commands. Answers questions appropriately. CN II-XII intact. HEENT:  NCAT.  MM pink/moist Cardiovascular:  RRR. S1/s2 no m/r/g Lungs: even/non-labored, lungs bilaterally diminished bases Abdomen:  Soft, non-tender, midline scar.  Left sided Ostomy. 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  + sacral decub  LABS:  CBC  Recent Labs Lab 02/18/15 0025 02/18/15 0335 02/19/15 0412  WBC 17.8* 16.5* 9.6  HGB 8.1* 8.3* 8.8*  HCT 25.0* 25.5* 27.1*  PLT 190 199 194   Coag's  Recent Labs Lab 02/18/15 0025 02/18/15 0335 02/19/15 0412  APTT 52*  --   --    INR 3.54* 3.62* 3.08*   BMET  Recent Labs Lab 02/18/15 0025 02/18/15 0335 02/19/15 0825  NA 134* 133* 136  K 3.5 3.7 3.7  CL 107 106 110  CO2 18* 18* 17*  BUN 36* 36* 31*  CREATININE 2.40* 2.64* 2.89*  GLUCOSE 120* 125* 95   Electrolytes  Recent Labs Lab 02/18/15 0025 02/18/15 0335 02/19/15 0825  CALCIUM 7.3* 7.1* 7.3*   Sepsis Markers  Recent Labs Lab 02/17/15 1410 02/17/15 1509 02/18/15 0025 02/18/15 0335 02/19/15 0412  LATICACIDVEN  --  0.77 1.1  --   --   PROCALCITON 27.72  --   --  22.20 11.01   ABG No results for input(s): PHART, PCO2ART, PO2ART in the last 168 hours.   Liver Enzymes  Recent Labs Lab 02/18/15 0025 02/18/15 0335  AST 16 17  ALT 8* 8*  ALKPHOS 69 66  BILITOT 0.7 0.7  ALBUMIN 2.2* 2.2*   Cardiac Enzymes No results for input(s): TROPONINI, PROBNP in the last 168 hours.   Glucose No results for input(s): GLUCAP in the last 168 hours.  Imaging Dg Chest Port 1 View  02/19/2015   CLINICAL DATA:  Pneumonia  EXAM: PORTABLE CHEST - 1 VIEW  COMPARISON:  02/18/2015  FINDINGS: Right-sided Port-A-Cath with tip over the distal SVC again noted. Moderate enlargement of the cardiac silhouette is noted. Diffuse prominence of the interstitial markings with  patchy bilateral lower lobe airspace opacities again noted, with increased linear right lower lobe atelectasis compared to previously. Lung volumes are extremely low. Trace pleural fluid is identified.  IMPRESSION: Increased right upper lobe atelectasis with persistent extremely low volumes and bibasilar airspace opacities.   Electronically Signed   By: Conchita Paris M.D.   On: 02/19/2015 08:48     ASSESSMENT / PLAN:  16M with septic shock likely from CAUTI with septic encephalopathy.  Alternate concerns would be meningitis, though given his history UTI seems more likely, additionally HCAP pneumonia would also be concern given CXR.  PULMONARY A:  Pulmonary infiltrate LLL concerning for  HCAP Hx PE  P:   Pulmonary hygiene  Intermittent CXR Oxygen as needed to maintain saturations > 92% Defer coumadin maintenance to primary SVC  CARDIOVASCULAR A:  Shock - initially hypertensive, sensitive to hydralazine.  Resolved.   HLD HTN P:  Monitor hemodynamics  NS @ KVO  RENAL / UROLOGY  A:   Anuric AKI - likely 2/2 septic shock, Scr up on admit to 2.4 with Baseline 0.8.   Metabolic Acidosis  High Grade Urothelial Carcinoma s/p ureteral stenting Chronic foley after numerous urologic interventions and spinal injury. P:   Foley cath changed Suspect CAUTI, tx as below Urology following per primary request   INFECTIOUS A:   Septic shock - suspected UTI, +/- LLL infiltrate.   Hx CAUTI - Klebsiella w ESBL organisims, MRSA, Pseudomonas  P:   BCx2 7/23 >> UC 7/23 >> Sputum 7/23 >>  Vancomycin 7/23 >>  Meropenem 7/22 >> 7/24 Zosyn 7/24 >>    NEUROLOGIC A:   Acute Metabolic Encephalopathy - in setting of sepsis & polypharmacy, resolved mental status Seizure Disorder  Bipolar Disorder  P:   Monitor mental status Continue baclofen, klonopin, lamictal, ativan, seroquel, trazodone Resume depakote Minimize sedating medications as able   PCCM will sign off, please call back if we can be of further assistance.   Noe Gens, NP-C [facility] Pgr: [phone] or if no answer [phone] 02/19/2015, 10:44 AM

## 2015-02-19 NOTE — Progress Notes (Signed)
  Pt admitted with sepsis picture. He is stable and PCCM signed off. His mental status improved. He has had left > right flank pain. His Cr is a bit worse today and above baseline. CT scan revealed bilateral hydro with stents in good position and bladder decompressed with foley.   Nurse noted urine draining around foley and had trouble irrigating foley today (she was kinking the tube off and just putting some fluid into the line, not aspirating).   Filed Vitals:   02/19/15 1200  BP: 166/96  Pulse: 99  Temp:   Resp: 24   PE: NAD Feels much better GU - foley irrigated. Some small clots had blocked foley. Balloon was mobile and foley irrigated normally. It appeared to drain normally.   I reviewed labs, Cx's and CT images. Urine cx pending. Blood Cx negative.    A/P: Urothelial carcinoma  - CT shows decrease in left bladder wall thickening (primary location of bladder tumor) and slight decrease in size of lymphadenopathy which are good signs of some response to chemotherapy.   Bilateral hydronephrosis  - not certain elevated Cr related to hydro or other medical issues, but I will set up for cysto, fulguration any bleeding, and bilateral ureteral stent change for one day this week. Will need to check OR schedule. I dont think it needs to be done urgently today. This is a good time to change the stents with him medically "stabilized" and on abx.   Gross hematuria - light hematuria. Small old clots blocking foley. Seems to be draining now. Will fulgurate any obvious bleeding with stent change.   Neurogenic bladder - continue foley. I can change at bedside if it's still a problem or will change in OR.

## 2015-02-19 NOTE — Progress Notes (Signed)
Pt arrived from ICU to 1412 via bed accompanied by RN and CNA. Pt was alert and oriented on arrival. He had foley, colostomy, PIV, and portacath intact. Vitals stable. Orders reviewed. Pt was oriented to room. Will continue to assess pt.
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
## 2015-02-20 DIAGNOSIS — E785 Hyperlipidemia, unspecified: Secondary | ICD-10-CM

## 2015-02-20 DIAGNOSIS — G8929 Other chronic pain: Secondary | ICD-10-CM

## 2015-02-20 LAB — CBC
HCT: 26.2 % — ABNORMAL LOW (ref 39.0–52.0)
Hemoglobin: 8.7 g/dL — ABNORMAL LOW (ref 13.0–17.0)
MCH: 27.4 pg (ref 26.0–34.0)
MCHC: 33.2 g/dL (ref 30.0–36.0)
MCV: 82.4 fL (ref 78.0–100.0)
PLATELETS: 207 10*3/uL (ref 150–400)
RBC: 3.18 MIL/uL — AB (ref 4.22–5.81)
RDW: 19.9 % — ABNORMAL HIGH (ref 11.5–15.5)
WBC: 8.9 10*3/uL (ref 4.0–10.5)

## 2015-02-20 LAB — RENAL FUNCTION PANEL
ANION GAP: 11 (ref 5–15)
Albumin: 2.3 g/dL — ABNORMAL LOW (ref 3.5–5.0)
BUN: 28 mg/dL — ABNORMAL HIGH (ref 6–20)
CHLORIDE: 109 mmol/L (ref 101–111)
CO2: 20 mmol/L — AB (ref 22–32)
Calcium: 7.8 mg/dL — ABNORMAL LOW (ref 8.9–10.3)
Creatinine, Ser: 2.38 mg/dL — ABNORMAL HIGH (ref 0.61–1.24)
GFR calc Af Amer: 33 mL/min — ABNORMAL LOW (ref >60)
GFR, EST NON AFRICAN AMERICAN: 29 mL/min — AB (ref >60)
Glucose, Bld: 106 mg/dL — ABNORMAL HIGH (ref 65–99)
POTASSIUM: 3.3 mmol/L — AB (ref 3.5–5.1)
Phosphorus: 4.5 mg/dL (ref 2.5–4.6)
Sodium: 140 mmol/L (ref 135–145)

## 2015-02-20 LAB — PROTIME-INR
INR: 3.06 — ABNORMAL HIGH (ref 0.00–1.49)
PROTHROMBIN TIME: 31.1 s — AB (ref 11.6–15.2)

## 2015-02-20 LAB — MAGNESIUM: MAGNESIUM: 1.3 mg/dL — AB (ref 1.7–2.4)

## 2015-02-20 LAB — VANCOMYCIN, TROUGH: Vancomycin Tr: 25 ug/mL — ABNORMAL HIGH (ref 10.0–20.0)

## 2015-02-20 MED ORDER — ALUM & MAG HYDROXIDE-SIMETH 200-200-20 MG/5ML PO SUSP
30.0000 mL | Freq: Once | ORAL | Status: AC
  Administered 2015-02-20: 30 mL via ORAL
  Filled 2015-02-20: qty 30

## 2015-02-20 NOTE — Progress Notes (Signed)
Clinical Social Work  Per MD, patient is not medically stable to DC today. Patient plans to return to [facility] at [place]. CSW went to meet with patient. Patient in bed and dtr [name]) present visiting with patient. Patient reports he is feeling better today and happy that dtr is here to visit. Patient has no complaints but reports CSW can follow up tomorrow so he can visit with dtr.  CSW will continue to follow.  [place], [place] [phone]

## 2015-02-20 NOTE — Progress Notes (Signed)
  Pt without complaints. Foley draining well. Cr improving.  Filed Vitals:   02/20/15 0442  BP: 128/80  Pulse: 71  Temp: 98.1 F (36.7 C)  Resp: 22     PE: NAD, brushing teeth GU - foley in place, urine clear but some small clots/tissue in tubing  CBC    Component Value Date/Time   WBC 8.9 02/20/2015 0600   WBC 4.1 01/17/2015 1123   RBC 3.18* 02/20/2015 0600   RBC 3.01* 01/17/2015 1123   HGB 8.7* 02/20/2015 0600   HGB 7.8* 01/17/2015 1123   HCT 26.2* 02/20/2015 0600   HCT 24.2* 01/17/2015 1123   PLT 207 02/20/2015 0600   PLT 418* 01/17/2015 1123   MCV 82.4 02/20/2015 0600   MCV 80.4 01/17/2015 1123   MCH 27.4 02/20/2015 0600   MCH 25.9* 01/17/2015 1123   MCHC 33.2 02/20/2015 0600   MCHC 32.2 01/17/2015 1123   RDW 19.9* 02/20/2015 0600   RDW 16.5* 01/17/2015 1123   LYMPHSABS 0.7 02/17/2015 1406   LYMPHSABS 1.0 01/17/2015 1123   MONOABS 2.1* 02/17/2015 1406   MONOABS 1.2* 01/17/2015 1123   EOSABS 0.3 02/17/2015 1406   EOSABS 0.0 01/17/2015 1123   BASOSABS 0.0 02/17/2015 1406   BASOSABS 0.0 01/17/2015 1123    BMET    Component Value Date/Time   NA 140 02/20/2015 0600   NA 137 01/17/2015 1123   K 3.3* 02/20/2015 0600   K 4.7 01/17/2015 1123   CL 109 02/20/2015 0600   CO2 20* 02/20/2015 0600   CO2 26 01/17/2015 1123   GLUCOSE 106* 02/20/2015 0600   GLUCOSE 86 01/17/2015 1123   BUN 28* 02/20/2015 0600   BUN 17.1 01/17/2015 1123   CREATININE 2.38* 02/20/2015 0600   CREATININE 0.8 01/17/2015 1123   CALCIUM 7.8* 02/20/2015 0600   CALCIUM 8.4 01/17/2015 1123   GFRNONAA 29* 02/20/2015 0600   GFRAA 33* 02/20/2015 0600    Imp - metastatic bladder cancer, bilateral hydronephrosis -   Plan - cystoscopy/stent change/fulguration bleeding poss TURBT tomorrow, 11:30 AM. Pt could go home tomorrow afternoon or Thursday if stable from a GU pt of view.
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
## 2015-02-20 NOTE — Progress Notes (Signed)
Date:  February 20, 2015 U.R. performed for needs and level of care. Will continue to follow for Case Management needs.  Rhonda Davis, RN, BSN, CCM   [phone] 
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
## 2015-02-20 NOTE — Progress Notes (Signed)
ANTICOAGULATION CONSULT NOTE - Follow Up Consult  Pharmacy Consult for warfarin Indication: Hx PE/DVT  Allergies  Allergen Reactions  . Tomato Other (See Comments)    Causes acid reflux    Patient Measurements: Height: 5\' 6"  (167.6 cm) Weight: 240 lb 3.2 oz (108.954 kg) IBW/kg (Calculated) : 63.8  Vital Signs: Temp: 97.5 F (36.4 C) (07/26 1429) Temp Source: Oral (07/26 1429) BP: 111/70 mmHg (07/26 1429) Pulse Rate: 89 (07/26 1429)  Labs:  Recent Labs  02/18/15 0020  02/18/15 0025 02/18/15 0335 02/19/15 0412 02/19/15 0825 02/20/15 0600  HGB  --   < > 8.1* 8.3* 8.8*  --  8.7*  HCT  --   < > 25.0* 25.5* 27.1*  --  26.2*  PLT  --   < > 190 199 194  --  207  APTT  --   --  52*  --   --   --   --   LABPROT  --   < > 34.7* 35.3* 31.2*  --  31.1*  INR  --   < > 3.54* 3.62* 3.08*  --  3.06*  CREATININE  --   < > 2.40* 2.64*  --  2.89* 2.38*  CKTOTAL 148  --   --   --   --   --   --   < > = values in this interval not displayed.  Estimated Creatinine Clearance: 39.7 mL/min (by C-G formula based on Cr of 2.38).   Assessment: 59yo M w/ sepsis, suspected healthcare associated pneumonia/aspiration pneumonia, hx of UTI ESBL, toxic metabolic encephalopathy. Pharmacy is asked to dose his Coumadin while inpatient.  Undergoing chemo for bladder cancer; hematuria noted recently   Baseline INR supratherapeutic on admission  Prior anticoagulation: does not have a scheduled regimen; doses have been 3-3.5 mg recently with dose held on 7/22  Significant events:  Today, 02/20/2015:  CBC: Hgb low but stable  INR remains slightly supratherapeutic  Major drug interactions: none noted  Foley cath with clear urine but some small clots in tubing per MD notes  Regular diet, fluid consistency thin  Goal of Therapy: INR 2-3  Plan:  Hold warfarin tonight.   Patient scheduled for cystoscopy, fulguration of bleeding, stent exchange, possible TURBT on 7/27. F/u plan on when to  resume anticoagulation post-procedure.   Daily PT/INR  CBC at least q72h while on warfarin   Monitor for s/s of worsening bleeding.   Lindell Spar, PharmD, BCPS Pager: [phone] 02/20/2015 3:21 PM

## 2015-02-21 ENCOUNTER — Inpatient Hospital Stay (HOSPITAL_COMMUNITY): Payer: Medicare Other | Admitting: Anesthesiology

## 2015-02-21 ENCOUNTER — Encounter (HOSPITAL_COMMUNITY): Payer: Self-pay | Admitting: Certified Registered"

## 2015-02-21 ENCOUNTER — Encounter (HOSPITAL_COMMUNITY)
Admission: EM | Disposition: A | Discharge status: Skilled Nursing Facility | Payer: Self-pay | Source: Home / Self Care | Attending: Family Medicine

## 2015-02-21 DIAGNOSIS — L8990 Pressure ulcer of unspecified site, unspecified stage: Secondary | ICD-10-CM

## 2015-02-21 DIAGNOSIS — Z86711 Personal history of pulmonary embolism: Secondary | ICD-10-CM

## 2015-02-21 DIAGNOSIS — N179 Acute kidney failure, unspecified: Secondary | ICD-10-CM

## 2015-02-21 DIAGNOSIS — Z960 Presence of urogenital implants: Secondary | ICD-10-CM

## 2015-02-21 DIAGNOSIS — Z1621 Resistance to vancomycin: Secondary | ICD-10-CM

## 2015-02-21 DIAGNOSIS — Z7901 Long term (current) use of anticoagulants: Secondary | ICD-10-CM

## 2015-02-21 DIAGNOSIS — A498 Other bacterial infections of unspecified site: Secondary | ICD-10-CM

## 2015-02-21 DIAGNOSIS — B1920 Unspecified viral hepatitis C without hepatic coma: Secondary | ICD-10-CM

## 2015-02-21 DIAGNOSIS — Y95 Nosocomial condition: Secondary | ICD-10-CM

## 2015-02-21 DIAGNOSIS — M4628 Osteomyelitis of vertebra, sacral and sacrococcygeal region: Secondary | ICD-10-CM

## 2015-02-21 DIAGNOSIS — B952 Enterococcus as the cause of diseases classified elsewhere: Secondary | ICD-10-CM

## 2015-02-21 DIAGNOSIS — N390 Urinary tract infection, site not specified: Secondary | ICD-10-CM

## 2015-02-21 DIAGNOSIS — C679 Malignant neoplasm of bladder, unspecified: Secondary | ICD-10-CM

## 2015-02-21 DIAGNOSIS — L89319 Pressure ulcer of right buttock, unspecified stage: Secondary | ICD-10-CM

## 2015-02-21 HISTORY — PX: CYSTOSCOPY W/ URETERAL STENT PLACEMENT: SHX1429

## 2015-02-21 LAB — CBC
HEMATOCRIT: 25.4 % — AB (ref 39.0–52.0)
Hemoglobin: 8.2 g/dL — ABNORMAL LOW (ref 13.0–17.0)
MCH: 26.1 pg (ref 26.0–34.0)
MCHC: 32.3 g/dL (ref 30.0–36.0)
MCV: 80.9 fL (ref 78.0–100.0)
PLATELETS: 199 10*3/uL (ref 150–400)
RBC: 3.14 MIL/uL — AB (ref 4.22–5.81)
RDW: 19.7 % — ABNORMAL HIGH (ref 11.5–15.5)
WBC: 8.3 10*3/uL (ref 4.0–10.5)

## 2015-02-21 LAB — RENAL FUNCTION PANEL
ANION GAP: 9 (ref 5–15)
Albumin: 2.3 g/dL — ABNORMAL LOW (ref 3.5–5.0)
BUN: 23 mg/dL — AB (ref 6–20)
CALCIUM: 7.7 mg/dL — AB (ref 8.9–10.3)
CO2: 23 mmol/L (ref 22–32)
Chloride: 108 mmol/L (ref 101–111)
Creatinine, Ser: 2.36 mg/dL — ABNORMAL HIGH (ref 0.61–1.24)
GFR calc Af Amer: 34 mL/min — ABNORMAL LOW (ref >60)
GFR, EST NON AFRICAN AMERICAN: 29 mL/min — AB (ref >60)
GLUCOSE: 103 mg/dL — AB (ref 65–99)
PHOSPHORUS: 4.2 mg/dL (ref 2.5–4.6)
Potassium: 3.1 mmol/L — ABNORMAL LOW (ref 3.5–5.1)
SODIUM: 140 mmol/L (ref 135–145)

## 2015-02-21 LAB — URINE CULTURE: Culture: >=100000

## 2015-02-21 LAB — SURGICAL PCR SCREEN
MRSA, PCR: NEGATIVE
Staphylococcus aureus: NEGATIVE

## 2015-02-21 LAB — MAGNESIUM: Magnesium: 1.3 mg/dL — ABNORMAL LOW (ref 1.7–2.4)

## 2015-02-21 LAB — PROTIME-INR
INR: 2.73 — AB (ref 0.00–1.49)
Prothrombin Time: 28.5 seconds — ABNORMAL HIGH (ref 11.6–15.2)

## 2015-02-21 SURGERY — CYSTOSCOPY, FLEXIBLE, WITH STENT REPLACEMENT
Anesthesia: General

## 2015-02-21 MED ORDER — LACTATED RINGERS IV SOLN
INTRAVENOUS | Status: DC | PRN
  Administered 2015-02-21: 12:00:00 via INTRAVENOUS

## 2015-02-21 MED ORDER — PROPOFOL 10 MG/ML IV BOLUS
INTRAVENOUS | Status: DC | PRN
  Administered 2015-02-21: 200 mg via INTRAVENOUS

## 2015-02-21 MED ORDER — MIDAZOLAM HCL 2 MG/2ML IJ SOLN
INTRAMUSCULAR | Status: AC
  Filled 2015-02-21: qty 2

## 2015-02-21 MED ORDER — ONDANSETRON HCL 4 MG/2ML IJ SOLN
INTRAMUSCULAR | Status: AC
  Filled 2015-02-21: qty 2

## 2015-02-21 MED ORDER — LORAZEPAM 2 MG/ML IJ SOLN: 0.5000 mg | INTRAMUSCULAR | Status: DC | PRN

## 2015-02-21 MED ORDER — LINEZOLID 600 MG PO TABS
600.0000 mg | ORAL_TABLET | Freq: Two times a day (BID) | ORAL | Status: DC
  Administered 2015-02-21 – 2015-02-22 (×2): 600 mg via ORAL
  Filled 2015-02-21 (×4): qty 1

## 2015-02-21 MED ORDER — ONDANSETRON HCL 4 MG/2ML IJ SOLN
INTRAMUSCULAR | Status: DC | PRN
  Administered 2015-02-21: 4 mg via INTRAVENOUS

## 2015-02-21 MED ORDER — LORAZEPAM 2 MG/ML IJ SOLN: 1.0000 mg | Freq: Three times a day (TID) | INTRAMUSCULAR | Status: DC | PRN

## 2015-02-21 MED ORDER — PIPERACILLIN-TAZOBACTAM 3.375 G IVPB
3.3750 g | Freq: Three times a day (TID) | INTRAVENOUS | Status: DC
  Administered 2015-02-21 – 2015-02-22 (×2): 3.375 g via INTRAVENOUS
  Filled 2015-02-21 (×3): qty 50

## 2015-02-21 MED ORDER — POTASSIUM CHLORIDE 10 MEQ/100ML IV SOLN
10.0000 meq | INTRAVENOUS | Status: AC
  Administered 2015-02-21 (×4): 10 meq via INTRAVENOUS
  Filled 2015-02-21 (×4): qty 100

## 2015-02-21 MED ORDER — PROPOFOL 10 MG/ML IV BOLUS
INTRAVENOUS | Status: AC
  Filled 2015-02-21: qty 20

## 2015-02-21 MED ORDER — FENTANYL CITRATE (PF) 100 MCG/2ML IJ SOLN
INTRAMUSCULAR | Status: AC
  Filled 2015-02-21: qty 2

## 2015-02-21 MED ORDER — 0.9 % SODIUM CHLORIDE (POUR BTL) OPTIME
TOPICAL | Status: DC | PRN
  Administered 2015-02-21: 1000 mL

## 2015-02-21 MED ORDER — WARFARIN 0.5 MG HALF TABLET
1.5000 mg | ORAL_TABLET | Freq: Once | ORAL | Status: AC
Start: 2015-02-21 — End: 2015-02-21
  Administered 2015-02-21: 1.5 mg via ORAL
  Filled 2015-02-21: qty 1

## 2015-02-21 MED ORDER — OXYCODONE HCL 5 MG/5ML PO SOLN: 5.0000 mg | Freq: Once | ORAL | Status: DC | PRN

## 2015-02-21 MED ORDER — LIDOCAINE HCL (PF) 2 % IJ SOLN
INTRAMUSCULAR | Status: DC | PRN
  Administered 2015-02-21: 30 mg via INTRADERMAL

## 2015-02-21 MED ORDER — METOCLOPRAMIDE HCL 5 MG/ML IJ SOLN
INTRAMUSCULAR | Status: DC | PRN
  Administered 2015-02-21: 10 mg via INTRAVENOUS

## 2015-02-21 MED ORDER — FENTANYL CITRATE (PF) 100 MCG/2ML IJ SOLN
INTRAMUSCULAR | Status: DC | PRN
  Administered 2015-02-21 (×2): 50 ug via INTRAVENOUS

## 2015-02-21 MED ORDER — SODIUM CHLORIDE 0.9 % IR SOLN
Status: DC | PRN
  Administered 2015-02-21: 6000 mL

## 2015-02-21 MED ORDER — METOCLOPRAMIDE HCL 5 MG/ML IJ SOLN
INTRAMUSCULAR | Status: AC
  Filled 2015-02-21: qty 2

## 2015-02-21 MED ORDER — ONDANSETRON HCL 4 MG/2ML IJ SOLN: 4.0000 mg | Freq: Once | INTRAMUSCULAR | Status: DC | PRN

## 2015-02-21 MED ORDER — LIDOCAINE HCL 2 % EX GEL
CUTANEOUS | Status: AC
  Filled 2015-02-21: qty 10

## 2015-02-21 MED ORDER — OXYCODONE HCL 5 MG PO TABS: 5.0000 mg | ORAL_TABLET | Freq: Once | ORAL | Status: DC | PRN

## 2015-02-21 MED ORDER — FENTANYL CITRATE (PF) 100 MCG/2ML IJ SOLN
25.0000 ug | INTRAMUSCULAR | Status: DC | PRN
  Administered 2015-02-21: 25 ug via INTRAVENOUS

## 2015-02-21 MED ORDER — LORAZEPAM 2 MG/ML IJ SOLN: 1.0000 mg | INTRAMUSCULAR | Status: DC | PRN

## 2015-02-21 MED ORDER — PHENYLEPHRINE HCL 10 MG/ML IJ SOLN
INTRAMUSCULAR | Status: DC | PRN
  Administered 2015-02-21 (×2): 80 ug via INTRAVENOUS

## 2015-02-21 MED ORDER — MIDAZOLAM HCL 5 MG/5ML IJ SOLN
INTRAMUSCULAR | Status: DC | PRN
  Administered 2015-02-21: 2 mg via INTRAVENOUS

## 2015-02-21 SURGICAL SUPPLY — 34 items
BAG URINE DRAINAGE (UROLOGICAL SUPPLIES) IMPLANT
BAG URO CATCHER STRL LF (DRAPE) ×4 IMPLANT
BASKET LASER NITINOL 1.9FR (BASKET) IMPLANT
BASKET STNLS GEMINI 4WIRE 3FR (BASKET) IMPLANT
BASKET ZERO TIP NITINOL 2.4FR (BASKET) IMPLANT
BRUSH URET BIOPSY 3F (UROLOGICAL SUPPLIES) IMPLANT
CATH COUDE 5CC RIBBED (CATHETERS) ×2 IMPLANT
CATH INTERMIT  6FR 70CM (CATHETERS) IMPLANT
CATH RIBBED COUDE 5CC (CATHETERS) ×2
CLOTH BEACON ORANGE TIMEOUT ST (SAFETY) ×4 IMPLANT
DRAPE CAMERA CLOSED 9X96 (DRAPES) IMPLANT
FIBER LASER FLEXIVA 1000 (UROLOGICAL SUPPLIES) IMPLANT
FIBER LASER FLEXIVA 200 (UROLOGICAL SUPPLIES) IMPLANT
FIBER LASER FLEXIVA 365 (UROLOGICAL SUPPLIES) IMPLANT
FIBER LASER FLEXIVA 550 (UROLOGICAL SUPPLIES) IMPLANT
FIBER LASER TRAC TIP (UROLOGICAL SUPPLIES) IMPLANT
GLOVE BIOGEL M STRL SZ7.5 (GLOVE) ×4 IMPLANT
GOWN STRL REUS W/TWL XL LVL3 (GOWN DISPOSABLE) ×4 IMPLANT
GUIDEWIRE ANG ZIPWIRE 038X150 (WIRE) ×4 IMPLANT
GUIDEWIRE STR DUAL SENSOR (WIRE) ×4 IMPLANT
IV NS IRRIG 3000ML ARTHROMATIC (IV SOLUTION) ×12 IMPLANT
KIT BALLIN UROMAX 15FX10 (LABEL) IMPLANT
KIT BALLN UROMAX 15FX4 (MISCELLANEOUS) IMPLANT
KIT BALLN UROMAX 26 75X4 (MISCELLANEOUS)
LOOP CUT BIPOLAR 24F LRG (ELECTROSURGICAL) IMPLANT
MANIFOLD NEPTUNE II (INSTRUMENTS) ×4 IMPLANT
PACK CYSTO (CUSTOM PROCEDURE TRAY) ×4 IMPLANT
SET HIGH PRES BAL DIL (LABEL)
SHEATH ACCESS URETERAL 24CM (SHEATH) IMPLANT
SHEATH ACCESS URETERAL 54CM (SHEATH) IMPLANT
STENT CONTOUR 7FRX26X.038 (STENTS) ×8 IMPLANT
SYRINGE IRR TOOMEY STRL 70CC (SYRINGE) IMPLANT
TUBING CONNECTING 10 (TUBING) ×3 IMPLANT
TUBING CONNECTING 10' (TUBING) ×1

## 2015-02-21 NOTE — Progress Notes (Addendum)
ANTICOAGULATION CONSULT NOTE - Follow Up Consult  Pharmacy Consult for warfarin Indication: Hx PE/DVT  Allergies  Allergen Reactions  . Tomato Other (See Comments)    Causes acid reflux    Patient Measurements: Height: 5\' 6"  (167.6 cm) Weight: 240 lb 3.2 oz (108.954 kg) IBW/kg (Calculated) : 63.8  Vital Signs: Temp: 98.4 F (36.9 C) (07/27 1334) Temp Source: Oral (07/27 1112) BP: 102/70 mmHg (07/27 1334) Pulse Rate: 103 (07/27 1333)  Labs:  Recent Labs  02/19/15 0412 02/19/15 0825 02/20/15 0600 02/21/15 0445  HGB 8.8*  --  8.7* 8.2*  HCT 27.1*  --  26.2* 25.4*  PLT 194  --  207 199  LABPROT 31.2*  --  31.1* 28.5*  INR 3.08*  --  3.06* 2.73*  CREATININE  --  2.89* 2.38* 2.36*    Estimated Creatinine Clearance: 40 mL/min (by C-G formula based on Cr of 2.36).   Assessment: 59yo M w/ sepsis, suspected healthcare associated pneumonia/aspiration pneumonia, hx of UTI ESBL, toxic metabolic encephalopathy. Pharmacy is asked to dose his Coumadin while inpatient.  Undergoing chemo for bladder cancer; hematuria noted recently   Baseline INR supratherapeutic on admission  Prior anticoagulation: does not have a scheduled regimen; doses have been 3-3.5 mg recently with dose held on 7/22  Significant events: 7/27: Underwent cystoscopy, bilateral ureteral stent exchange, fulguration of bladder tumor in OR  Today, 02/21/2015:  CBC: Hgb low but stable, Pltc WNL  INR now in therapeutic range (Warfarin held 7/22-7/26)  Regular diet, fluid consistency thin  Major drug interactions: none  Goal of Therapy: INR 2-3  Plan:  Per [facility], ok to resume warfarin this evening.  Give Warfarin 1.5 mg PO x 1 this evening.   Daily PT/INR  CBC in AM.  Monitor for s/s of bleeding.   Lindell Spar, PharmD, BCPS Pager: [phone] 02/21/2015 1:57 PM
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
## 2015-02-21 NOTE — Progress Notes (Signed)
  Pt without complaint.   On zosyn for VRE.   Stable for surgery. I reviewed notes and labs.   I discussed with the patient the nature, potential benefits, risks and alternatives to cystoscopy, poss TURBT, ureteral stent exchange, including side effects of the proposed treatment, the likelihood of the patient achieving the goals of the procedure, and any potential problems that might occur during the procedure or recuperation. All questions answered. Patient elects to proceed.
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
## 2015-02-21 NOTE — Discharge Instructions (Signed)
Ureteral Stent Implantation, Care After °Refer to this sheet in the next few weeks. These instructions provide you with information on caring for yourself after your procedure. Your health care provider may also give you more specific instructions. Your treatment has been planned according to current medical practices, but problems sometimes occur. Call your health care provider if you have any problems or questions after your procedure. °WHAT TO EXPECT AFTER THE PROCEDURE °You should be back to normal activity within 48 hours after the procedure. Nausea and vomiting may occur and are commonly the result of anesthesia. °It is common to experience sharp pain in the back or lower abdomen and penis with voiding. This is caused by movement of the ends of the stent with the act of urinating. It usually goes away within minutes after you have stopped urinating. °HOME CARE INSTRUCTIONS °Make sure to drink plenty of fluids. You may have small amounts of bleeding, causing your urine to be red. This is normal. Certain movements may trigger pain or a feeling that you need to urinate. You may be given medicines to prevent infection or bladder spasms. Be sure to take all medicines as directed. Only take over-the-counter or prescription medicines for pain, discomfort, or fever as directed by your health care provider. Do not take aspirin, as this can make bleeding worse. °Your stent will be left in until the blockage is resolved. This may take 2 weeks or longer, depending on the reason for stent implantation. You may have an X-ray exam to make sure your ureter is open and that the stent has not moved out of position (migrated). The stent can be removed by your health care provider in the office. Medicines may be given for comfort while the stent is being removed. Be sure to keep all follow-up appointments so your health care provider can check that you are healing properly. °SEEK MEDICAL CARE IF: °· You experience increasing  pain. °· Your pain medicine is not working. °SEEK IMMEDIATE MEDICAL CARE IF: °· Your urine is dark red or has blood clots. °· You are leaking urine (incontinent). °· You have a fever, chills, feeling sick to your stomach (nausea), or vomiting. °· Your pain is not relieved by pain medicine. °· The end of the stent comes out of the urethra. °· You are unable to urinate. °Document Released: 03/16/2013 Document Revised: 07/19/2013 Document Reviewed: 03/16/2013 °[employer]® Patient Information ©2015 [employer]. This information is not intended to replace advice given to you by your health care provider. Make sure you discuss any questions you have with your health care provider. ° °

## 2015-02-21 NOTE — Anesthesia Postprocedure Evaluation (Signed)
  Anesthesia Post-op Note  Patient: Samuel [name]  Procedure(s) Performed: Procedure(s) (LRB): CYSTOSCOPY FULGERATION OF BLEEDERS BILATERAL STENT CHANGE (Bilateral)  Patient Location: PACU  Anesthesia Type: General  Level of Consciousness: awake and alert   Airway and Oxygen Therapy: Patient Spontanous Breathing  Post-op Pain: mild  Post-op Assessment: Post-op Vital signs reviewed, Patient's Cardiovascular Status Stable, Respiratory Function Stable, Patent Airway and No signs of Nausea or vomiting  Last Vitals:  Filed Vitals:   02/21/15 1400  BP: 118/72  Pulse: 91  Temp:   Resp: 22    Post-op Vital Signs: stable   Complications: No apparent anesthesia complications

## 2015-02-21 NOTE — Plan of Care (Signed)
Problem: Phase I Progression Outcomes Goal: Voiding-avoid urinary catheter unless indicated Outcome: Not Applicable Date Met:  48/18/56 Pt has chronic foley

## 2015-02-21 NOTE — Progress Notes (Signed)
Clinical Social Work  Per MD, patient is not medically stable to DC today. CSW updated [facility] who is agreeable to accept whenever patient is stable. PASRR number received and SNF aware.  CSW will continue to follow.  [place], [place] [phone]

## 2015-02-21 NOTE — Anesthesia Preprocedure Evaluation (Addendum)
Anesthesia Evaluation  Patient identified by MRN, date of birth, ID band Patient awake    Reviewed: Allergy & Precautions, NPO status , Patient's Chart, lab work & pertinent test results  Airway Mallampati: III  TM Distance: >3 FB Neck ROM: Full    Dental  (+) Poor Dentition   Pulmonary shortness of breath, pneumonia -, resolved, former smoker,  breath sounds clear to auscultation  Pulmonary exam normal       Cardiovascular hypertension, Pt. on medications + Peripheral Vascular Disease Normal cardiovascular examRhythm:Regular Rate:Normal     Neuro/Psych Seizures -,  PSYCHIATRIC DISORDERS Bipolar Disorder paraplegic  Neuromuscular disease    GI/Hepatic negative GI ROS, Neg liver ROS, GERD-  Medicated,(+) Hepatitis -  Endo/Other  negative endocrine ROSMorbid obesity  Renal/GU Renal disease  negative genitourinary   Musculoskeletal negative musculoskeletal ROS (+)   Abdominal (+) + obese,   Peds negative pediatric ROS (+)  Hematology  (+) anemia ,   Anesthesia Other Findings Patient stabilized inpatient from psychiatric and critical care standpoint, however still has K 3.1, Hgb 8, INR >2 given hx of DVT and need for anticoagulation with warfarin Getting IV Potassium NPO appropriate, allergies reviewed Denies active cardiac or pulmonary symptoms, METS < 4, paraplegic Has a recent cough but says his work of breathing is baseline   Reproductive/Obstetrics negative OB ROS                         Anesthesia Physical  Anesthesia Plan  ASA: III  Anesthesia Plan: General   Post-op Pain Management:    Induction: Intravenous  Airway Management Planned: LMA  Additional Equipment:   Intra-op Plan:   Post-operative Plan: Extubation in OR  Informed Consent: I have reviewed the patients History and Physical, chart, labs and discussed the procedure including the risks, benefits and alternatives  for the proposed anesthesia with the patient or authorized representative who has indicated his/her understanding and acceptance.   Dental advisory given  Plan Discussed with: CRNA and Anesthesiologist  Anesthesia Plan Comments: (Will continue antibiotics as scheduled, have LoPro intubating device if needed given previous need for intubation, continue antipsychotic medications )      Anesthesia Quick Evaluation

## 2015-02-21 NOTE — Op Note (Signed)
Preoperative diagnosis: Metastatic urothelial carcinoma, bilateral hydronephrosis, acute on chronic renal failure, gross hematuria  Procedure: Cystoscopy, bilateral ureteral stent exchange, Fulguration of bladder tumor  Surgeon: [name]  Type of anesthesia: Gen.  Indication for procedure: [name] is a 59 year old male with metastatic bladder cancer. He had bilateral hydronephrosis and stents in place. He is on Zosyn for a VRE and his kidney function worsened. He had a hydronephrosis despite 6 x 26 cm bilateral ureteral stents. He is brought today for stent change while he is stable. Is also had some hematuria on and off and is on chronic Coumadin for hypercoagulable state.  Findings: On cystoscopy the urethra appeared normal, the tumor and the prostatic urethra showed significant regression as well as tumor along the trigone and the left bladder wall showed significant regression and necrosis. There was some bleeding tumor around the left ureteral orifice. Mainly just oozing vessels.  Description of procedure: After consent was obtained patient brought to the operating room. After adequate anesthesia his placed in lithotomy position and prepped and draped in the usual sterile fashion. A timeout was performed to confirm the patient and procedure. The cystoscope was passed per urethra and the bladder inspected. The left ureteral stent was grasped and removed through the urethral meatus. A Glidewire was advanced through the stents and coiled in the collecting system. The left ureteral stent was removed and the wire was backloaded on the cystoscope. A 7 x 26 cm left ureteral stent was advanced. The wire was removed with a good coil seen in the collecting system and a good coil in the bladder.  The right ureteral stent was grasped and removed through the urethral meatus. The Glidewire was advanced through the stent and cold in the collecting system. The stent was removed. The wire was backloaded on  the cystoscope and a 7 x 26 cm ureteral stent was advanced. The wire was removed with a good coil noted in the collecting system and a good coil in the bladder.  The button electrode was then placed in a fulgurated some tumor that was oozing over toward the left ureteral orifice. I could see the orifice and did not fulgurated specifically. I carried the fulguration around toward the back of the bladder and then down the left side of the trigone and into the prostatic urethra with the tumor was extending and noted to be oozing. I did not use excessive cautery. There was excellent hemostasis at low-pressure. The bladder was refilled and the scope removed. A place an 61 [place] coud catheter without difficulty. Drainage was clear. The patient was awakened and taken to recovery room in stable condition.  Complication: None  Blood loss: Minimal  Specimens: None  Drains: Bilateral 7 x 26 cm ureteral stents  Disposition: Patient stable to PACU
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
## 2015-02-21 NOTE — Anesthesia Procedure Notes (Signed)
Procedure Name: LMA Insertion Date/Time: 02/21/2015 12:18 PM Performed by: Lajuana Carry E Pre-anesthesia Checklist: Patient identified, Emergency Drugs available, Suction available and Patient being monitored Patient Re-evaluated:Patient Re-evaluated prior to inductionOxygen Delivery Method: Circle system utilized Preoxygenation: Pre-oxygenation with 100% oxygen Intubation Type: IV induction Ventilation: Mask ventilation without difficulty LMA: LMA inserted LMA Size: 4.0 Number of attempts: 1 Placement Confirmation: positive ETCO2 and breath sounds checked- equal and bilateral Dental Injury: Teeth and Oropharynx as per pre-operative assessment

## 2015-02-21 NOTE — Transfer of Care (Signed)
Immediate Anesthesia Transfer of Care Note  Patient: Samuel [name]  Procedure(s) Performed: Procedure(s): CYSTOSCOPY FULGERATION OF BLEEDERS BILATERAL STENT CHANGE (Bilateral)  Patient Location: PACU  Anesthesia Type:General  Level of Consciousness:  sedated, patient cooperative and responds to stimulation  Airway & Oxygen Therapy:Patient Spontanous Breathing and Patient connected to face mask oxgen  Post-op Assessment:  Report given to PACU RN and Post -op Vital signs reviewed and stable  Post vital signs:  Reviewed and stable  Last Vitals:  Filed Vitals:   02/21/15 1333  BP:   Pulse: 103  Temp:   Resp: 25    Complications: No apparent anesthesia complications

## 2015-02-22 ENCOUNTER — Encounter (HOSPITAL_COMMUNITY): Payer: Self-pay | Admitting: Urology

## 2015-02-22 ENCOUNTER — Other Ambulatory Visit: Payer: Self-pay | Admitting: *Deleted

## 2015-02-22 DIAGNOSIS — A419 Sepsis, unspecified organism: Principal | ICD-10-CM

## 2015-02-22 DIAGNOSIS — G9340 Encephalopathy, unspecified: Secondary | ICD-10-CM

## 2015-02-22 DIAGNOSIS — A491 Streptococcal infection, unspecified site: Secondary | ICD-10-CM | POA: Diagnosis present

## 2015-02-22 DIAGNOSIS — N179 Acute kidney failure, unspecified: Secondary | ICD-10-CM | POA: Diagnosis present

## 2015-02-22 DIAGNOSIS — Z1621 Resistance to vancomycin: Secondary | ICD-10-CM

## 2015-02-22 DIAGNOSIS — J189 Pneumonia, unspecified organism: Secondary | ICD-10-CM

## 2015-02-22 LAB — RENAL FUNCTION PANEL
ALBUMIN: 2.3 g/dL — AB (ref 3.5–5.0)
Anion gap: 8 (ref 5–15)
BUN: 17 mg/dL (ref 6–20)
CALCIUM: 7.6 mg/dL — AB (ref 8.9–10.3)
CO2: 24 mmol/L (ref 22–32)
Chloride: 106 mmol/L (ref 101–111)
Creatinine, Ser: 1.62 mg/dL — ABNORMAL HIGH (ref 0.61–1.24)
GFR calc Af Amer: 53 mL/min — ABNORMAL LOW (ref >60)
GFR, EST NON AFRICAN AMERICAN: 46 mL/min — AB (ref >60)
GLUCOSE: 92 mg/dL (ref 65–99)
Phosphorus: 4.6 mg/dL (ref 2.5–4.6)
Potassium: 3.4 mmol/L — ABNORMAL LOW (ref 3.5–5.1)
Sodium: 138 mmol/L (ref 135–145)

## 2015-02-22 LAB — CBC
HEMATOCRIT: 26.2 % — AB (ref 39.0–52.0)
HEMOGLOBIN: 8.5 g/dL — AB (ref 13.0–17.0)
MCH: 26.6 pg (ref 26.0–34.0)
MCHC: 32.4 g/dL (ref 30.0–36.0)
MCV: 82.1 fL (ref 78.0–100.0)
PLATELETS: 255 10*3/uL (ref 150–400)
RBC: 3.19 MIL/uL — ABNORMAL LOW (ref 4.22–5.81)
RDW: 20 % — ABNORMAL HIGH (ref 11.5–15.5)
WBC: 7.6 10*3/uL (ref 4.0–10.5)

## 2015-02-22 LAB — CULTURE, BLOOD (ROUTINE X 2)
CULTURE: NO GROWTH
Culture: NO GROWTH

## 2015-02-22 LAB — MAGNESIUM: MAGNESIUM: 1.3 mg/dL — AB (ref 1.7–2.4)

## 2015-02-22 LAB — PROTIME-INR
INR: 2.57 — AB (ref 0.00–1.49)
PROTHROMBIN TIME: 27.2 s — AB (ref 11.6–15.2)

## 2015-02-22 MED ORDER — SILVER SULFADIAZINE 1 % EX CREA: TOPICAL_CREAM | Freq: Two times a day (BID) | CUTANEOUS | Status: DC

## 2015-02-22 MED ORDER — LEVOFLOXACIN 750 MG PO TABS: 750.0000 mg | ORAL_TABLET | ORAL | Status: AC

## 2015-02-22 MED ORDER — LAMOTRIGINE 200 MG PO TABS: 100.0000 mg | ORAL_TABLET | Freq: Two times a day (BID) | ORAL | Status: DC

## 2015-02-22 MED ORDER — BACLOFEN 10 MG PO TABS: 5.0000 mg | ORAL_TABLET | Freq: Every evening | ORAL | Status: DC | PRN

## 2015-02-22 MED ORDER — HEPARIN SOD (PORK) LOCK FLUSH 100 UNIT/ML IV SOLN
500.0000 [IU] | INTRAVENOUS | Status: AC | PRN
  Administered 2015-02-22: 500 [IU]

## 2015-02-22 MED ORDER — QUETIAPINE FUMARATE 100 MG PO TABS: 100.0000 mg | ORAL_TABLET | Freq: Every day | ORAL | Status: DC

## 2015-02-22 MED ORDER — POTASSIUM CHLORIDE CRYS ER 20 MEQ PO TBCR
40.0000 meq | EXTENDED_RELEASE_TABLET | Freq: Once | ORAL | Status: AC
  Administered 2015-02-22: 40 meq via ORAL
  Filled 2015-02-22: qty 2

## 2015-02-22 MED ORDER — OXYCODONE HCL 5 MG PO TABS: 5.0000 mg | ORAL_TABLET | Freq: Four times a day (QID) | ORAL | Status: DC | PRN

## 2015-02-22 MED ORDER — HEPARIN SOD (PORK) LOCK FLUSH 100 UNIT/ML IV SOLN
500.0000 [IU] | INTRAVENOUS | Status: DC | PRN
  Filled 2015-02-22: qty 5

## 2015-02-22 MED ORDER — WARFARIN SODIUM 1 MG PO TABS
1.5000 mg | ORAL_TABLET | Freq: Once | ORAL | Status: DC
  Filled 2015-02-22: qty 1

## 2015-02-22 MED ORDER — LINEZOLID 600 MG PO TABS: 600.0000 mg | ORAL_TABLET | Freq: Two times a day (BID) | ORAL | Status: AC

## 2015-02-22 MED ORDER — CLONAZEPAM 0.5 MG PO TABS: ORAL_TABLET | ORAL | Status: DC

## 2015-02-22 MED ORDER — WARFARIN SODIUM 1 MG PO TABS: 1.5000 mg | ORAL_TABLET | Freq: Every day | ORAL | Status: DC

## 2015-02-22 MED ORDER — MAGNESIUM SULFATE 2 GM/50ML IV SOLN
2.0000 g | Freq: Once | INTRAVENOUS | Status: AC
  Administered 2015-02-22: 2 g via INTRAVENOUS
  Filled 2015-02-22: qty 50

## 2015-02-22 MED ORDER — TRAZODONE HCL 50 MG PO TABS: 50.0000 mg | ORAL_TABLET | Freq: Every day | ORAL | Status: DC
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
## 2015-02-22 NOTE — Progress Notes (Signed)
ANTICOAGULATION CONSULT NOTE - Follow Up Consult  Pharmacy Consult for warfarin Indication: Hx PE/DVT  Allergies  Allergen Reactions  . Tomato Other (See Comments)    Causes acid reflux    Patient Measurements: Height: 5\' 6"  (167.6 cm) Weight: 234 lb 2.1 oz (106.2 kg) IBW/kg (Calculated) : 63.8  Vital Signs: Temp: 97.8 F (36.6 C) (07/28 0608) Temp Source: Oral (07/27 2331) BP: 122/74 mmHg (07/28 0608) Pulse Rate: 83 (07/28 0608)  Labs:  Recent Labs  02/20/15 0600 02/21/15 0445 02/22/15 0520  HGB 8.7* 8.2* 8.5*  HCT 26.2* 25.4* 26.2*  PLT 207 199 255  LABPROT 31.1* 28.5* 27.2*  INR 3.06* 2.73* 2.57*  CREATININE 2.38* 2.36* 1.62*    Estimated Creatinine Clearance: 57.5 mL/min (by C-G formula based on Cr of 1.62).   Assessment: 59yo M w/ sepsis, suspected healthcare associated pneumonia/aspiration pneumonia, hx of UTI ESBL, toxic metabolic encephalopathy. Pharmacy is asked to dose his Coumadin while inpatient.  Undergoing chemo for bladder cancer; hematuria noted recently   Baseline INR supratherapeutic on admission  Prior anticoagulation: does not have a scheduled regimen; doses have been 3-3.5 mg recently with dose held on 7/22  Significant events: 7/27: Underwent cystoscopy, bilateral ureteral stent exchange, fulguration of bladder tumor in OR  Today, 02/22/2015:  CBC: Hgb low but stable, Pltc WNL  INR 2.57, therapeutic  (Warfarin held 7/22-7/26)  Regular diet, fluid consistency thin  Major drug interactions: expected start po levaquin today which will increase the effects of warfarin  Goal of Therapy: INR 2-3  Plan:  Give Warfarin 1.5 mg PO x 1 this evening.   Daily PT/INR  Monitor for s/s of bleeding.   Dolly Rias RPh 02/22/2015, 9:30 AM Pager [phone]

## 2015-02-22 NOTE — Progress Notes (Signed)
Clinical Social Work  CSW faxed DC summary to [facility] who is agreeable to accept patient today. CSW prepared DC packet with FL2, DC summary and hard scripts included. CSW informed patient of DC who reports he is happy to DC back home and he will alert family. RN to call report. CSW arranged for PTAR per patient request.  PTAR request #: [phone]  CSW is signing off but available if needed.  [place], [place] [phone]

## 2015-02-22 NOTE — Telephone Encounter (Signed)
[facility] 

## 2015-02-22 NOTE — Progress Notes (Signed)
Samuel [name] to be D/C'd Skilled nursing facility per MD order.  Discussed prescriptions and follow up appointments with the patient. Prescriptions given to patient, medication list explained in detail. Pt verbalized understanding.    Medication List    STOP taking these medications        amitriptyline 75 MG tablet  Commonly known as:  ELAVIL     doxycycline 100 MG tablet  Commonly known as:  VIBRA-TABS     guaiFENesin 100 MG/5ML liquid  Commonly known as:  ROBITUSSIN     metoCLOPramide 5 MG tablet  Commonly known as:  REGLAN     mupirocin ointment 2 %  Commonly known as:  BACTROBAN     ondansetron 4 MG tablet  Commonly known as:  ZOFRAN     pregabalin 100 MG capsule  Commonly known as:  LYRICA      TAKE these medications        acetaminophen 325 MG tablet  Commonly known as:  TYLENOL  Take 650 mg by mouth every 6 (six) hours as needed for moderate pain.     alum & mag hydroxide-simeth 009-381-82 MG/5ML suspension  Commonly known as:  MAALOX PLUS  Take 20 mLs by mouth every 6 (six) hours as needed for indigestion.     atorvastatin 10 MG tablet  Commonly known as:  LIPITOR  Take 10 mg by mouth daily at 6 PM.     baclofen 10 MG tablet  Commonly known as:  LIORESAL  Take 0.5 tablets (5 mg total) by mouth at bedtime as needed for muscle spasms.     Cholecalciferol 50000 UNITS capsule  Take 50,000 Units by mouth every 30 (thirty) days.     clonazePAM 0.5 MG tablet  Commonly known as:  KLONOPIN  Take 1/2 tablet by mouth twice daily     cyanocobalamin 1000 MCG tablet  Take 100 mcg by mouth daily.     DEXILANT 60 MG capsule  Generic drug:  dexlansoprazole  Take 60 mg by mouth daily.     docusate sodium 100 MG capsule  Commonly known as:  COLACE  Take 100 mg by mouth 2 (two) times daily.     famotidine 20 MG tablet  Commonly known as:  PEPCID  Take 20 mg by mouth at bedtime.     ferrous sulfate 325 (65 FE) MG EC tablet  Take 325 mg by mouth 2 (two)  times daily.     folic acid 1 MG tablet  Commonly known as:  FOLVITE  Take 1 mg by mouth daily.     lactose free nutrition Liqd  Take 237 mLs by mouth 2 (two) times daily between meals.     lactulose 10 GM/15ML solution  Commonly known as:  CHRONULAC  Take 20 g by mouth 4 (four) times daily.     lamoTRIgine 200 MG tablet  Commonly known as:  LAMICTAL  Take 0.5 tablets (100 mg total) by mouth 2 (two) times daily.     levofloxacin 750 MG tablet  Commonly known as:  LEVAQUIN  Take 1 tablet (750 mg total) by mouth every other day.     lidocaine 5 %  Commonly known as:  LIDODERM  Place 2 patches onto the skin as needed (for pain). Remove & Discard patch within 12 hours or as directed by MD (apply 2 patches to left stump daily at 9pm     linezolid 600 MG tablet  Commonly known as:  ZYVOX  Take 1 tablet (600  mg total) by mouth every 12 (twelve) hours.     metoprolol tartrate 25 MG tablet  Commonly known as:  LOPRESSOR  Take 1 tablet (25 mg total) by mouth 2 (two) times daily.     nitroGLYCERIN 0.4 MG SL tablet  Commonly known as:  NITROSTAT  Place 0.4 mg under the tongue every 5 (five) minutes as needed for chest pain.     Normal Saline Flush 0.9 % Soln  Inject 10 mLs into the vein See admin instructions. For flushing porta cath every 4 weeks, when port not in use     ondansetron 8 MG disintegrating tablet  Commonly known as:  ZOFRAN-ODT  Take 8 mg by mouth every 8 (eight) hours as needed for nausea or vomiting.     oxybutynin 5 MG 24 hr tablet  Commonly known as:  DITROPAN-XL  Take 5 mg by mouth every morning.     oxyCODONE 5 MG immediate release tablet  Commonly known as:  Oxy IR/ROXICODONE  Take 1 tablet (5 mg total) by mouth every 6 (six) hours as needed for moderate pain or severe pain.     promethazine 12.5 MG tablet  Commonly known as:  PHENERGAN  Take 12.5 mg by mouth every 6 (six) hours as needed for nausea or vomiting.     QUEtiapine 100 MG tablet  Commonly  known as:  SEROQUEL  Take 1 tablet (100 mg total) by mouth at bedtime.     senna 8.6 MG tablet  Commonly known as:  SENOKOT  Take 2 tablets by mouth every morning.     silver sulfADIAZINE 1 % cream  Commonly known as:  SILVADENE  Apply topically 2 (two) times daily.     SYSTANE 0.4-0.3 % Soln  Generic drug:  Polyethyl Glycol-Propyl Glycol  Apply 2 drops to eye 3 (three) times daily.     traZODone 50 MG tablet  Commonly known as:  DESYREL  Take 1 tablet (50 mg total) by mouth at bedtime.     warfarin 1 MG tablet  Commonly known as:  COUMADIN  Take 1.5 tablets (1.5 mg total) by mouth daily at 6 PM.        Filed Vitals:   02/22/15 0608  BP: 122/74  Pulse: 83  Temp: 97.8 F (36.6 C)  Resp: 20    Skin clean, dry and intact without evidence of skin break down, no evidence of skin tears noted. IV catheter discontinued intact. Site without signs and symptoms of complications. Dressing and pressure applied. Pt denies pain at this time. No complaints noted.  An After Visit Summary was printed and given to the patient. Patient escorted via [place], and D/C home via private auto.  Lolita Rieger 02/22/2015 12:45 PM

## 2015-03-02 ENCOUNTER — Other Ambulatory Visit: Payer: Self-pay | Admitting: *Deleted

## 2015-03-02 MED ORDER — TRAMADOL HCL 50 MG PO TABS: ORAL_TABLET | ORAL | Status: DC

## 2015-03-02 NOTE — Telephone Encounter (Signed)
[facility] 

## 2015-03-09 ENCOUNTER — Telehealth: Payer: Self-pay | Admitting: Oncology

## 2015-03-09 NOTE — Telephone Encounter (Signed)
S/w nurse from facility where pt is staying confirming missed labs/ov back in July.... KJ

## 2015-03-13 ENCOUNTER — Non-Acute Institutional Stay (SKILLED_NURSING_FACILITY): Payer: Medicare Other | Admitting: Internal Medicine

## 2015-03-13 DIAGNOSIS — Z7901 Long term (current) use of anticoagulants: Secondary | ICD-10-CM | POA: Diagnosis not present

## 2015-03-13 DIAGNOSIS — R410 Disorientation, unspecified: Secondary | ICD-10-CM | POA: Diagnosis not present

## 2015-03-13 DIAGNOSIS — I2782 Chronic pulmonary embolism: Secondary | ICD-10-CM

## 2015-03-13 DIAGNOSIS — A4102 Sepsis due to Methicillin resistant Staphylococcus aureus: Secondary | ICD-10-CM | POA: Diagnosis not present

## 2015-03-13 DIAGNOSIS — J189 Pneumonia, unspecified organism: Secondary | ICD-10-CM | POA: Diagnosis not present

## 2015-03-13 DIAGNOSIS — C688 Malignant neoplasm of overlapping sites of urinary organs: Secondary | ICD-10-CM | POA: Diagnosis not present

## 2015-03-13 DIAGNOSIS — C689 Malignant neoplasm of urinary organ, unspecified: Secondary | ICD-10-CM

## 2015-03-14 LAB — CBC AND DIFFERENTIAL
HCT: 22 % — AB (ref 41–53)
Hemoglobin: 7.3 g/dL — AB (ref 13.5–17.5)
Platelets: 193 10*3/uL (ref 150–399)
WBC: 9.2 10^3/mL

## 2015-03-14 LAB — BASIC METABOLIC PANEL
BUN: 16 mg/dL (ref 4–21)
CREATININE: 1.4 mg/dL — AB (ref 0.6–1.3)
Glucose: 115 mg/dL
Potassium: 3.7 mmol/L (ref 3.4–5.3)
Sodium: 134 mmol/L — AB (ref 137–147)

## 2015-03-15 NOTE — Telephone Encounter (Signed)
Nurse from facility called to confirmed labs/ov then she put her supervisor on the phone questioning me concerning [place] calling them back concerning pt's apts. I advised her that I s/w nurse giving this information on 08/12 am time confirming labs/ov she s/w nurse while on the phone with me and is concerned that pt wasn't given the information but was on their calendar..... KJ

## 2015-03-16 ENCOUNTER — Telehealth: Payer: Self-pay | Admitting: *Deleted

## 2015-03-16 NOTE — Telephone Encounter (Signed)
Received labwork from [facility] faxed to our office. Tried faxing them due to needing DOB to look up patient. Called and spoke with Christinia Gully at [facility] and lab was taken care of by Dr. Dellia Nims. Abstracted.
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
## 2015-03-20 ENCOUNTER — Non-Acute Institutional Stay (SKILLED_NURSING_FACILITY): Payer: Medicare Other | Admitting: Internal Medicine

## 2015-03-20 DIAGNOSIS — R569 Unspecified convulsions: Secondary | ICD-10-CM | POA: Diagnosis not present

## 2015-03-20 DIAGNOSIS — N179 Acute kidney failure, unspecified: Secondary | ICD-10-CM | POA: Diagnosis not present

## 2015-03-23 ENCOUNTER — Encounter (HOSPITAL_COMMUNITY): Payer: Self-pay | Admitting: *Deleted

## 2015-03-23 ENCOUNTER — Emergency Department (HOSPITAL_COMMUNITY): Payer: Medicare Other

## 2015-03-23 ENCOUNTER — Inpatient Hospital Stay (HOSPITAL_COMMUNITY)
Admission: EM | Admit: 2015-03-23 | Discharge: 2015-03-27 | DRG: 699 | Disposition: A | Discharge status: Skilled Nursing Facility | Payer: Medicare Other | Attending: Internal Medicine | Admitting: Internal Medicine

## 2015-03-23 ENCOUNTER — Ambulatory Visit: Payer: Self-pay | Admitting: Oncology

## 2015-03-23 ENCOUNTER — Other Ambulatory Visit: Payer: Self-pay

## 2015-03-23 ENCOUNTER — Ambulatory Visit (HOSPITAL_COMMUNITY): Admission: RE | Admit: 2015-03-23 | Payer: Medicare Other | Source: Ambulatory Visit

## 2015-03-23 ENCOUNTER — Inpatient Hospital Stay (HOSPITAL_COMMUNITY): Payer: Medicare Other

## 2015-03-23 DIAGNOSIS — Z86711 Personal history of pulmonary embolism: Secondary | ICD-10-CM | POA: Diagnosis not present

## 2015-03-23 DIAGNOSIS — R590 Localized enlarged lymph nodes: Secondary | ICD-10-CM | POA: Diagnosis present

## 2015-03-23 DIAGNOSIS — N189 Chronic kidney disease, unspecified: Secondary | ICD-10-CM

## 2015-03-23 DIAGNOSIS — T8351XA Infection and inflammatory reaction due to indwelling urinary catheter, initial encounter: Secondary | ICD-10-CM | POA: Diagnosis present

## 2015-03-23 DIAGNOSIS — D509 Iron deficiency anemia, unspecified: Secondary | ICD-10-CM | POA: Diagnosis present

## 2015-03-23 DIAGNOSIS — Z79899 Other long term (current) drug therapy: Secondary | ICD-10-CM | POA: Diagnosis not present

## 2015-03-23 DIAGNOSIS — E872 Acidosis: Secondary | ICD-10-CM | POA: Diagnosis present

## 2015-03-23 DIAGNOSIS — N390 Urinary tract infection, site not specified: Secondary | ICD-10-CM | POA: Diagnosis present

## 2015-03-23 DIAGNOSIS — D649 Anemia, unspecified: Secondary | ICD-10-CM | POA: Insufficient documentation

## 2015-03-23 DIAGNOSIS — Z1621 Resistance to vancomycin: Secondary | ICD-10-CM | POA: Diagnosis present

## 2015-03-23 DIAGNOSIS — I10 Essential (primary) hypertension: Secondary | ICD-10-CM | POA: Diagnosis present

## 2015-03-23 DIAGNOSIS — K219 Gastro-esophageal reflux disease without esophagitis: Secondary | ICD-10-CM | POA: Diagnosis present

## 2015-03-23 DIAGNOSIS — N319 Neuromuscular dysfunction of bladder, unspecified: Secondary | ICD-10-CM | POA: Diagnosis present

## 2015-03-23 DIAGNOSIS — M869 Osteomyelitis, unspecified: Secondary | ICD-10-CM | POA: Diagnosis not present

## 2015-03-23 DIAGNOSIS — D630 Anemia in neoplastic disease: Secondary | ICD-10-CM | POA: Diagnosis not present

## 2015-03-23 DIAGNOSIS — C689 Malignant neoplasm of urinary organ, unspecified: Secondary | ICD-10-CM | POA: Diagnosis present

## 2015-03-23 DIAGNOSIS — R410 Disorientation, unspecified: Secondary | ICD-10-CM

## 2015-03-23 DIAGNOSIS — Z79891 Long term (current) use of opiate analgesic: Secondary | ICD-10-CM | POA: Diagnosis not present

## 2015-03-23 DIAGNOSIS — E785 Hyperlipidemia, unspecified: Secondary | ICD-10-CM | POA: Diagnosis present

## 2015-03-23 DIAGNOSIS — R4182 Altered mental status, unspecified: Secondary | ICD-10-CM | POA: Diagnosis present

## 2015-03-23 DIAGNOSIS — C688 Malignant neoplasm of overlapping sites of urinary organs: Secondary | ICD-10-CM

## 2015-03-23 DIAGNOSIS — J9611 Chronic respiratory failure with hypoxia: Secondary | ICD-10-CM | POA: Diagnosis present

## 2015-03-23 DIAGNOSIS — C679 Malignant neoplasm of bladder, unspecified: Secondary | ICD-10-CM | POA: Diagnosis present

## 2015-03-23 DIAGNOSIS — Y846 Urinary catheterization as the cause of abnormal reaction of the patient, or of later complication, without mention of misadventure at the time of the procedure: Secondary | ICD-10-CM | POA: Diagnosis present

## 2015-03-23 DIAGNOSIS — B952 Enterococcus as the cause of diseases classified elsewhere: Secondary | ICD-10-CM | POA: Diagnosis present

## 2015-03-23 DIAGNOSIS — Z91018 Allergy to other foods: Secondary | ICD-10-CM | POA: Diagnosis not present

## 2015-03-23 DIAGNOSIS — Z515 Encounter for palliative care: Secondary | ICD-10-CM | POA: Insufficient documentation

## 2015-03-23 DIAGNOSIS — F319 Bipolar disorder, unspecified: Secondary | ICD-10-CM | POA: Diagnosis present

## 2015-03-23 DIAGNOSIS — G8220 Paraplegia, unspecified: Secondary | ICD-10-CM | POA: Diagnosis present

## 2015-03-23 DIAGNOSIS — L89159 Pressure ulcer of sacral region, unspecified stage: Secondary | ICD-10-CM | POA: Diagnosis present

## 2015-03-23 DIAGNOSIS — G40909 Epilepsy, unspecified, not intractable, without status epilepticus: Secondary | ICD-10-CM | POA: Diagnosis present

## 2015-03-23 DIAGNOSIS — Z87891 Personal history of nicotine dependence: Secondary | ICD-10-CM

## 2015-03-23 DIAGNOSIS — Z89612 Acquired absence of left leg above knee: Secondary | ICD-10-CM

## 2015-03-23 DIAGNOSIS — Z7901 Long term (current) use of anticoagulants: Secondary | ICD-10-CM | POA: Diagnosis not present

## 2015-03-23 DIAGNOSIS — E860 Dehydration: Secondary | ICD-10-CM | POA: Diagnosis present

## 2015-03-23 DIAGNOSIS — N179 Acute kidney failure, unspecified: Secondary | ICD-10-CM | POA: Diagnosis present

## 2015-03-23 DIAGNOSIS — N1330 Unspecified hydronephrosis: Secondary | ICD-10-CM | POA: Diagnosis present

## 2015-03-23 DIAGNOSIS — Z933 Colostomy status: Secondary | ICD-10-CM

## 2015-03-23 DIAGNOSIS — E876 Hypokalemia: Secondary | ICD-10-CM | POA: Diagnosis present

## 2015-03-23 DIAGNOSIS — C680 Malignant neoplasm of urethra: Secondary | ICD-10-CM | POA: Diagnosis not present

## 2015-03-23 DIAGNOSIS — T83511A Infection and inflammatory reaction due to indwelling urethral catheter, initial encounter: Secondary | ICD-10-CM

## 2015-03-23 LAB — COMPREHENSIVE METABOLIC PANEL
ALT: 10 U/L — AB (ref 17–63)
AST: 17 U/L (ref 15–41)
Albumin: 2.7 g/dL — ABNORMAL LOW (ref 3.5–5.0)
Alkaline Phosphatase: 96 U/L (ref 38–126)
Anion gap: 11 (ref 5–15)
BUN: 37 mg/dL — ABNORMAL HIGH (ref 6–20)
CHLORIDE: 101 mmol/L (ref 101–111)
CO2: 22 mmol/L (ref 22–32)
CREATININE: 3.09 mg/dL — AB (ref 0.61–1.24)
Calcium: 8.2 mg/dL — ABNORMAL LOW (ref 8.9–10.3)
GFR calc Af Amer: 24 mL/min — ABNORMAL LOW (ref >60)
GFR calc non Af Amer: 21 mL/min — ABNORMAL LOW (ref >60)
Glucose, Bld: 96 mg/dL (ref 65–99)
POTASSIUM: 4.5 mmol/L (ref 3.5–5.1)
SODIUM: 134 mmol/L — AB (ref 135–145)
Total Bilirubin: 0.4 mg/dL (ref 0.3–1.2)
Total Protein: 8 g/dL (ref 6.5–8.1)

## 2015-03-23 LAB — CBC WITH DIFFERENTIAL/PLATELET
BASOS PCT: 1 % (ref 0–1)
Basophils Absolute: 0.1 10*3/uL (ref 0.0–0.1)
EOS ABS: 0.9 10*3/uL — AB (ref 0.0–0.7)
EOS PCT: 8 % — AB (ref 0–5)
HCT: 25 % — ABNORMAL LOW (ref 39.0–52.0)
Hemoglobin: 8.1 g/dL — ABNORMAL LOW (ref 13.0–17.0)
LYMPHS PCT: 12 % (ref 12–46)
Lymphs Abs: 1.4 10*3/uL (ref 0.7–4.0)
MCH: 26.6 pg (ref 26.0–34.0)
MCHC: 32.4 g/dL (ref 30.0–36.0)
MCV: 82.2 fL (ref 78.0–100.0)
Monocytes Absolute: 0.8 10*3/uL (ref 0.1–1.0)
Monocytes Relative: 7 % (ref 3–12)
Neutro Abs: 8.2 10*3/uL — ABNORMAL HIGH (ref 1.7–7.7)
Neutrophils Relative %: 72 % (ref 43–77)
PLATELETS: 370 10*3/uL (ref 150–400)
RBC: 3.04 MIL/uL — AB (ref 4.22–5.81)
RDW: 18.8 % — AB (ref 11.5–15.5)
WBC: 11.3 10*3/uL — AB (ref 4.0–10.5)

## 2015-03-23 LAB — I-STAT ARTERIAL BLOOD GAS, ED
ACID-BASE DEFICIT: 2 mmol/L (ref 0.0–2.0)
BICARBONATE: 23.7 meq/L (ref 20.0–24.0)
O2 Saturation: 98 %
PCO2 ART: 42.1 mmHg (ref 35.0–45.0)
PH ART: 7.359 (ref 7.350–7.450)
PO2 ART: 114 mmHg — AB (ref 80.0–100.0)
Patient temperature: 98.7
TCO2: 25 mmol/L (ref 0–100)

## 2015-03-23 LAB — AMMONIA: Ammonia: 36 umol/L — ABNORMAL HIGH (ref 9–35)

## 2015-03-23 LAB — URINALYSIS, ROUTINE W REFLEX MICROSCOPIC
Bilirubin Urine: NEGATIVE
Glucose, UA: NEGATIVE mg/dL
Ketones, ur: NEGATIVE mg/dL
Nitrite: NEGATIVE
PROTEIN: 100 mg/dL — AB
SPECIFIC GRAVITY, URINE: 1.011 (ref 1.005–1.030)
Urobilinogen, UA: 0.2 mg/dL (ref 0.0–1.0)
pH: 7 (ref 5.0–8.0)

## 2015-03-23 LAB — MRSA PCR SCREENING: MRSA by PCR: NEGATIVE

## 2015-03-23 LAB — PROTIME-INR
INR: 1.9 — ABNORMAL HIGH (ref 0.00–1.49)
PROTHROMBIN TIME: 21.7 s — AB (ref 11.6–15.2)

## 2015-03-23 LAB — URINE MICROSCOPIC-ADD ON

## 2015-03-23 MED ORDER — CLONAZEPAM 0.5 MG PO TABS
0.5000 mg | ORAL_TABLET | Freq: Every day | ORAL | Status: DC | PRN
  Administered 2015-03-23 – 2015-03-25 (×3): 0.5 mg via ORAL
  Filled 2015-03-23 (×5): qty 1

## 2015-03-23 MED ORDER — SILVER SULFADIAZINE 1 % EX CREA
TOPICAL_CREAM | Freq: Two times a day (BID) | CUTANEOUS | Status: DC
  Filled 2015-03-23: qty 85

## 2015-03-23 MED ORDER — HEPARIN SODIUM (PORCINE) 5000 UNIT/ML IJ SOLN
5000.0000 [IU] | Freq: Three times a day (TID) | INTRAMUSCULAR | Status: DC
  Administered 2015-03-23 – 2015-03-25 (×7): 5000 [IU] via SUBCUTANEOUS
  Filled 2015-03-23 (×6): qty 1

## 2015-03-23 MED ORDER — BACLOFEN 5 MG HALF TABLET
5.0000 mg | ORAL_TABLET | Freq: Every evening | ORAL | Status: DC | PRN
  Administered 2015-03-23 – 2015-03-24 (×2): 5 mg via ORAL
  Filled 2015-03-23 (×3): qty 1

## 2015-03-23 MED ORDER — SODIUM CHLORIDE 0.9 % IV SOLN
INTRAVENOUS | Status: DC
  Administered 2015-03-23 – 2015-03-26 (×4): via INTRAVENOUS

## 2015-03-23 MED ORDER — FOLIC ACID 1 MG PO TABS
1.0000 mg | ORAL_TABLET | Freq: Every day | ORAL | Status: DC
  Administered 2015-03-23 – 2015-03-27 (×5): 1 mg via ORAL
  Filled 2015-03-23 (×5): qty 1

## 2015-03-23 MED ORDER — BOOST PO LIQD
237.0000 mL | Freq: Two times a day (BID) | ORAL | Status: DC
  Filled 2015-03-23 (×12): qty 237

## 2015-03-23 MED ORDER — OXYBUTYNIN CHLORIDE ER 5 MG PO TB24
5.0000 mg | ORAL_TABLET | Freq: Every morning | ORAL | Status: DC
  Administered 2015-03-23 – 2015-03-27 (×5): 5 mg via ORAL
  Filled 2015-03-23 (×5): qty 1

## 2015-03-23 MED ORDER — DOCUSATE SODIUM 100 MG PO CAPS
100.0000 mg | ORAL_CAPSULE | Freq: Two times a day (BID) | ORAL | Status: DC
  Administered 2015-03-23 – 2015-03-27 (×6): 100 mg via ORAL
  Filled 2015-03-23 (×8): qty 1

## 2015-03-23 MED ORDER — OXYCODONE HCL 5 MG PO TABS
5.0000 mg | ORAL_TABLET | Freq: Four times a day (QID) | ORAL | Status: DC | PRN
  Administered 2015-03-23 – 2015-03-27 (×11): 5 mg via ORAL
  Filled 2015-03-23 (×12): qty 1

## 2015-03-23 MED ORDER — LACTULOSE 10 GM/15ML PO SOLN
20.0000 g | Freq: Four times a day (QID) | ORAL | Status: DC
  Filled 2015-03-23 (×5): qty 30

## 2015-03-23 MED ORDER — BACLOFEN 10 MG PO TABS
5.0000 mg | ORAL_TABLET | Freq: Three times a day (TID) | ORAL | Status: DC
  Administered 2015-03-23 – 2015-03-27 (×10): 5 mg via ORAL
  Filled 2015-03-23 (×12): qty 1

## 2015-03-23 MED ORDER — PROMETHAZINE HCL 25 MG PO TABS
25.0000 mg | ORAL_TABLET | Freq: Three times a day (TID) | ORAL | Status: DC | PRN
  Administered 2015-03-25 – 2015-03-26 (×5): 25 mg via ORAL
  Filled 2015-03-23 (×5): qty 1

## 2015-03-23 MED ORDER — POLYETHYL GLYCOL-PROPYL GLYCOL 0.4-0.3 % OP SOLN: 2.0000 [drp] | Freq: Three times a day (TID) | OPHTHALMIC | Status: DC

## 2015-03-23 MED ORDER — SENNA 8.6 MG PO TABS
2.0000 | ORAL_TABLET | Freq: Every day | ORAL | Status: DC
  Administered 2015-03-23 – 2015-03-27 (×2): 17.2 mg via ORAL
  Filled 2015-03-23 (×3): qty 2

## 2015-03-23 MED ORDER — LINEZOLID 600 MG/300ML IV SOLN
600.0000 mg | Freq: Two times a day (BID) | INTRAVENOUS | Status: DC
  Administered 2015-03-23 – 2015-03-25 (×5): 600 mg via INTRAVENOUS
  Filled 2015-03-23 (×6): qty 300

## 2015-03-23 MED ORDER — CLONAZEPAM 0.5 MG PO TABS: 0.2500 mg | ORAL_TABLET | Freq: Two times a day (BID) | ORAL | Status: DC

## 2015-03-23 MED ORDER — SENNOSIDES 8.6 MG PO TABS: 2.0000 | ORAL_TABLET | Freq: Every morning | ORAL | Status: DC

## 2015-03-23 MED ORDER — QUETIAPINE FUMARATE 50 MG PO TABS
300.0000 mg | ORAL_TABLET | Freq: Every day | ORAL | Status: DC
  Administered 2015-03-23 – 2015-03-26 (×4): 300 mg via ORAL
  Filled 2015-03-23: qty 12
  Filled 2015-03-23 (×4): qty 6

## 2015-03-23 MED ORDER — FAMOTIDINE 20 MG PO TABS
20.0000 mg | ORAL_TABLET | Freq: Every day | ORAL | Status: DC
  Administered 2015-03-23 – 2015-03-26 (×4): 20 mg via ORAL
  Filled 2015-03-23 (×4): qty 1

## 2015-03-23 MED ORDER — PROMETHAZINE HCL 12.5 MG PO TABS: 12.5000 mg | ORAL_TABLET | Freq: Four times a day (QID) | ORAL | Status: DC | PRN

## 2015-03-23 MED ORDER — LIDOCAINE 5 % EX PTCH
2.0000 | MEDICATED_PATCH | Freq: Every day | CUTANEOUS | Status: DC | PRN
  Administered 2015-03-26: 2 via TRANSDERMAL
  Filled 2015-03-23: qty 2

## 2015-03-23 MED ORDER — ACETAMINOPHEN 325 MG PO TABS
650.0000 mg | ORAL_TABLET | Freq: Four times a day (QID) | ORAL | Status: DC | PRN
  Administered 2015-03-24 – 2015-03-26 (×5): 650 mg via ORAL
  Filled 2015-03-23 (×6): qty 2

## 2015-03-23 MED ORDER — WARFARIN - PHARMACIST DOSING INPATIENT: Freq: Every day | Status: DC

## 2015-03-23 MED ORDER — PREGABALIN 25 MG PO CAPS
25.0000 mg | ORAL_CAPSULE | Freq: Three times a day (TID) | ORAL | Status: DC
  Administered 2015-03-23: 25 mg via ORAL
  Filled 2015-03-23: qty 1

## 2015-03-23 MED ORDER — QUETIAPINE FUMARATE 25 MG PO TABS: 100.0000 mg | ORAL_TABLET | Freq: Every day | ORAL | Status: DC

## 2015-03-23 MED ORDER — LAMOTRIGINE 100 MG PO TABS
100.0000 mg | ORAL_TABLET | Freq: Two times a day (BID) | ORAL | Status: DC
  Administered 2015-03-23 – 2015-03-27 (×9): 100 mg via ORAL
  Filled 2015-03-23 (×13): qty 1

## 2015-03-23 MED ORDER — CLONAZEPAM 0.5 MG PO TABS
0.2500 mg | ORAL_TABLET | Freq: Every day | ORAL | Status: DC
  Administered 2015-03-26: 0.25 mg via ORAL
  Filled 2015-03-23 (×2): qty 1

## 2015-03-23 MED ORDER — ONDANSETRON 4 MG PO TBDP: 8.0000 mg | ORAL_TABLET | Freq: Three times a day (TID) | ORAL | Status: DC | PRN

## 2015-03-23 MED ORDER — PREGABALIN 25 MG PO CAPS
25.0000 mg | ORAL_CAPSULE | Freq: Two times a day (BID) | ORAL | Status: DC
  Administered 2015-03-23 – 2015-03-27 (×8): 25 mg via ORAL
  Filled 2015-03-23 (×8): qty 1

## 2015-03-23 MED ORDER — METOPROLOL TARTRATE 25 MG PO TABS
25.0000 mg | ORAL_TABLET | Freq: Two times a day (BID) | ORAL | Status: DC
  Filled 2015-03-23: qty 1

## 2015-03-23 MED ORDER — FERROUS SULFATE 325 (65 FE) MG PO TABS
325.0000 mg | ORAL_TABLET | Freq: Two times a day (BID) | ORAL | Status: DC
  Administered 2015-03-23 – 2015-03-27 (×5): 325 mg via ORAL
  Filled 2015-03-23 (×7): qty 1

## 2015-03-23 MED ORDER — POLYVINYL ALCOHOL 1.4 % OP SOLN
2.0000 [drp] | Freq: Three times a day (TID) | OPHTHALMIC | Status: DC
  Administered 2015-03-23 – 2015-03-24 (×3): 2 [drp] via OPHTHALMIC
  Filled 2015-03-23 (×2): qty 15

## 2015-03-23 MED ORDER — PANTOPRAZOLE SODIUM 40 MG PO TBEC
40.0000 mg | DELAYED_RELEASE_TABLET | Freq: Every day | ORAL | Status: DC
  Administered 2015-03-23 – 2015-03-27 (×5): 40 mg via ORAL
  Filled 2015-03-23 (×5): qty 1

## 2015-03-23 MED ORDER — WARFARIN SODIUM 6 MG PO TABS
6.0000 mg | ORAL_TABLET | Freq: Once | ORAL | Status: AC
  Administered 2015-03-23: 6 mg via ORAL
  Filled 2015-03-23 (×3): qty 1

## 2015-03-23 MED ORDER — METOPROLOL TARTRATE 25 MG PO TABS
25.0000 mg | ORAL_TABLET | Freq: Two times a day (BID) | ORAL | Status: DC
  Administered 2015-03-23 – 2015-03-27 (×9): 25 mg via ORAL
  Filled 2015-03-23 (×8): qty 1

## 2015-03-23 MED ORDER — TRAZODONE HCL 50 MG PO TABS
50.0000 mg | ORAL_TABLET | Freq: Every day | ORAL | Status: DC
  Administered 2015-03-23 – 2015-03-26 (×4): 50 mg via ORAL
  Filled 2015-03-23 (×4): qty 1

## 2015-03-23 MED ORDER — NORMAL SALINE FLUSH 0.9 % IV SOLN: 10.0000 mL | INTRAVENOUS | Status: DC

## 2015-03-23 MED ORDER — FERROUS SULFATE 325 (65 FE) MG PO TBEC: 325.0000 mg | DELAYED_RELEASE_TABLET | Freq: Two times a day (BID) | ORAL | Status: DC

## 2015-03-23 MED ORDER — ATORVASTATIN CALCIUM 10 MG PO TABS
10.0000 mg | ORAL_TABLET | Freq: Every day | ORAL | Status: DC
  Administered 2015-03-23 – 2015-03-26 (×4): 10 mg via ORAL
  Filled 2015-03-23 (×4): qty 1

## 2015-03-23 MED ORDER — TRAMADOL HCL 50 MG PO TABS
50.0000 mg | ORAL_TABLET | Freq: Four times a day (QID) | ORAL | Status: DC | PRN
  Administered 2015-03-23 – 2015-03-27 (×10): 50 mg via ORAL
  Filled 2015-03-23 (×10): qty 1

## 2015-03-23 MED ORDER — ONDANSETRON HCL 4 MG PO TABS: 4.0000 mg | ORAL_TABLET | Freq: Three times a day (TID) | ORAL | Status: DC | PRN
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
## 2015-03-23 NOTE — ED Notes (Signed)
Patient transported to Ultrasound 

## 2015-03-23 NOTE — Progress Notes (Signed)
Pt arrived to unit floor on stretcher with RN and nurse tech. Patient alert and oriented x 4, with no complaints of pain at this time. Placed patient on contact precautions. No telemetry orders at this time. Patient was oriented to unit and unit staff. Report given to oncoming RN.   Shelbie Hutching, RN, BSN

## 2015-03-23 NOTE — ED Notes (Signed)
Pt arrives from [facility] with a c/o buzzing in the ear but per facility pt has been lethargic all day.
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
## 2015-03-23 NOTE — Progress Notes (Signed)
Report received from ED RN. Room is prepared and ready for patient's arrival.   Shelbie Hutching, RN, BSN

## 2015-03-23 NOTE — Progress Notes (Addendum)
Patient seen and examined  Worried about his Metastatic urothelial carcinoma, requesting that he receive chemotherapy as soon as possible I have discussed with Dr. Alen Blew, his multiple comorbidities and renal failure preclude chemotherapy during this admission. Oncology to evaluate patient to make further recommendations about ongoing treatment in the near future  Urology consultation has been placed for patient's acute on chronic renal failure and bilateral hydronephrosis  I received a call from the patient's nursing home that the patient may have had 2 seizures prior to transfer. Patient states that he has been receiving Lyrica and Lamictal at the nursing home  [place] at a lower dose to prevent benzodiazepine withdrawal seizure  Continue seizure precautions

## 2015-03-23 NOTE — ED Notes (Signed)
Pt. assisted to change to hospital gown , pt. repositioned on bed for comfort . Warm blanket provided , IV site intact , pt. waiting for admitting MD .

## 2015-03-24 DIAGNOSIS — Z515 Encounter for palliative care: Secondary | ICD-10-CM

## 2015-03-24 LAB — CBC
HEMATOCRIT: 25 % — AB (ref 39.0–52.0)
Hemoglobin: 8 g/dL — ABNORMAL LOW (ref 13.0–17.0)
MCH: 26.7 pg (ref 26.0–34.0)
MCHC: 32 g/dL (ref 30.0–36.0)
MCV: 83.3 fL (ref 78.0–100.0)
PLATELETS: 355 10*3/uL (ref 150–400)
RBC: 3 MIL/uL — ABNORMAL LOW (ref 4.22–5.81)
RDW: 18.5 % — AB (ref 11.5–15.5)
WBC: 8.4 10*3/uL (ref 4.0–10.5)

## 2015-03-24 LAB — COMPREHENSIVE METABOLIC PANEL
ALT: 9 U/L — ABNORMAL LOW (ref 17–63)
ANION GAP: 10 (ref 5–15)
AST: 15 U/L (ref 15–41)
Albumin: 2.3 g/dL — ABNORMAL LOW (ref 3.5–5.0)
Alkaline Phosphatase: 84 U/L (ref 38–126)
BILIRUBIN TOTAL: 0.3 mg/dL (ref 0.3–1.2)
BUN: 22 mg/dL — AB (ref 6–20)
CO2: 23 mmol/L (ref 22–32)
Calcium: 8 mg/dL — ABNORMAL LOW (ref 8.9–10.3)
Chloride: 102 mmol/L (ref 101–111)
Creatinine, Ser: 1.93 mg/dL — ABNORMAL HIGH (ref 0.61–1.24)
GFR, EST AFRICAN AMERICAN: 43 mL/min — AB (ref >60)
GFR, EST NON AFRICAN AMERICAN: 37 mL/min — AB (ref >60)
Glucose, Bld: 91 mg/dL (ref 65–99)
POTASSIUM: 4.4 mmol/L (ref 3.5–5.1)
Sodium: 135 mmol/L (ref 135–145)
TOTAL PROTEIN: 7.5 g/dL (ref 6.5–8.1)

## 2015-03-24 LAB — PROTIME-INR
INR: 1.95 — ABNORMAL HIGH (ref 0.00–1.49)
Prothrombin Time: 22.1 seconds — ABNORMAL HIGH (ref 11.6–15.2)

## 2015-03-24 MED ORDER — POLYVINYL ALCOHOL 1.4 % OP SOLN
2.0000 [drp] | OPHTHALMIC | Status: DC | PRN
  Administered 2015-03-24 – 2015-03-26 (×5): 2 [drp] via OPHTHALMIC
  Filled 2015-03-24: qty 15

## 2015-03-24 MED ORDER — ALUM & MAG HYDROXIDE-SIMETH 200-200-20 MG/5ML PO SUSP
30.0000 mL | Freq: Three times a day (TID) | ORAL | Status: DC | PRN
  Administered 2015-03-24: 30 mL via ORAL
  Filled 2015-03-24 (×3): qty 30

## 2015-03-24 MED ORDER — HYPROMELLOSE (GONIOSCOPIC) 2.5 % OP SOLN
1.0000 [drp] | Freq: Three times a day (TID) | OPHTHALMIC | Status: DC | PRN
  Filled 2015-03-24: qty 15

## 2015-03-24 MED ORDER — WARFARIN SODIUM 5 MG PO TABS
5.0000 mg | ORAL_TABLET | Freq: Once | ORAL | Status: AC
  Administered 2015-03-24: 5 mg via ORAL
  Filled 2015-03-24: qty 1
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
## 2015-03-24 NOTE — Progress Notes (Signed)
Subjective: Patient reports improvement in fatigue. Creatinine has improved to 1.93 from 3.05 yesterday. Pt has made 1.4L of urine Objective: Vital signs in last 24 hours: Temp:  [97.8 F (36.6 C)-98.4 F (36.9 C)] 98.3 F (36.8 C) (08/27 1024) Pulse Rate:  [68-100] 70 (08/27 1024) Resp:  [18-22] 18 (08/27 1024) BP: (135-179)/(78-85) 176/85 mmHg (08/27 1024) SpO2:  [99 %-100 %] 100 % (08/27 1024) Weight:  [100.7 kg (222 lb 0.1 oz)] 100.7 kg (222 lb 0.1 oz) (08/26 2023)  Intake/Output from previous day: 08/26 0701 - 08/27 0700 In: 2700 [P.O.:1200; I.V.:1200; IV Piggyback:300] Out: 1625 [Urine:1425; Stool:200] Intake/Output this shift: Total I/O In: 590 [P.O.:240; I.V.:350] Out: -   Physical Exam:  General:alert, cooperative and appears stated age GI: soft, non tender, normal bowel sounds, no palpable masses, no organomegaly, no inguinal hernia Male genitalia: no penile lesions or discharge no testicular masses Resp: clear to auscultation bilaterally Cardio: regular rate and rhythm, S1, S2 normal, no murmur, click, rub or gallop  Lab Results:  Recent Labs  03/23/15 0232 03/24/15 0527  HGB 8.1* 8.0*  HCT 25.0* 25.0*   BMET  Recent Labs  03/23/15 0232 03/24/15 0527  NA 134* 135  K 4.5 4.4  CL 101 102  CO2 22 23  GLUCOSE 96 91  BUN 37* 22*  CREATININE 3.09* 1.93*  CALCIUM 8.2* 8.0*    Recent Labs  03/23/15 0232 03/24/15 0527  INR 1.90* 1.95*   No results for input(s): LABURIN in the last 72 hours. Results for orders placed or performed during the hospital encounter of 03/23/15  MRSA PCR Screening     Status: None   Collection Time: 03/23/15  3:28 PM  Result Value Ref Range Status   MRSA by PCR NEGATIVE NEGATIVE Final    Comment:        The GeneXpert MRSA Assay (FDA approved for NASAL specimens only), is one component of a comprehensive MRSA colonization surveillance program. It is not intended to diagnose MRSA infection nor to guide or monitor  treatment for MRSA infections.     Studies/Results: Ct Head Wo Contrast  03/23/2015   CLINICAL DATA:  Lethargy. Buzzing in the ear. History of paraplegia following a spinal cord injury.  EXAM: CT HEAD WITHOUT CONTRAST  TECHNIQUE: Contiguous axial images were obtained from the base of the skull through the vertex without intravenous contrast.  COMPARISON:  10/03/2014  FINDINGS: Ventricles and sulci appear symmetrical. No mass effect or midline shift. No abnormal extra-axial fluid collections. Gray-white matter junctions are distinct. Basal cisterns are not effaced. No evidence of acute intracranial hemorrhage. No depressed skull fractures. Visualized paranasal sinuses and mastoid air cells are not opacified.  IMPRESSION: No acute intracranial abnormalities.   Electronically Signed   By: Lucienne Capers M.D.   On: 03/23/2015 02:11   US Renal  03/23/2015   CLINICAL DATA:  Initial evaluation for acute renal failure.  EXAM: RENAL / URINARY TRACT ULTRASOUND COMPLETE  COMPARISON:  Prior CT from 02/18/2015.  FINDINGS: Examination is suboptimal due to body habitus.  Right Kidney:  Length: 9.9 cm. Echogenicity within normal limits. Probable mild hydronephrosis present.  Left Kidney:  Length: 11.4 cm. Echogenicity within normal limits. Probable mild hydronephrosis present. Possible small 9 mm echogenic focus at the mid to lower pole, which may reflect a small nonobstructive stone.  Bladder:  Appears normal for degree of bladder distention.  IMPRESSION: 1. Suboptimal exam due to body habitus. 2. Findings suggestive of mild bilateral hydronephrosis. 3. Question 9  mm focus at the mid - lower pole left kidney, which may reflect a small stone.   Electronically Signed   By: Jeannine Boga M.D.   On: 03/23/2015 06:12   Dg Chest Port 1 View  03/23/2015   CLINICAL DATA:  Lethargic all day.  EXAM: PORTABLE CHEST - 1 VIEW  COMPARISON:  02/19/2015  FINDINGS: Shallow inspiration. Cardiac enlargement without vascular  congestion. Linear atelectasis in the lung bases. No pneumothorax. No blunting of costophrenic angles. Power port type central venous catheter with tip over the mid SVC region. No change since prior study.  IMPRESSION: Shallow inspiration with atelectasis in the lung bases. Mild cardiac enlargement.   Electronically Signed   By: Lucienne Capers M.D.   On: 03/23/2015 01:48    Assessment/Plan: 59yo with metastatic TCC, ARF, mild hydronephrosis  Recs: 1. Please continue gentle hydration. ARF likely dehydration and not stent failure.  2. Continue foley to striaght drain 3. Urology to continue to follow  LOS: 1 day   Orlena Garmon L 03/24/2015, 11:50 AM
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
## 2015-03-25 LAB — COMPREHENSIVE METABOLIC PANEL
ALT: 8 U/L — AB (ref 17–63)
AST: 16 U/L (ref 15–41)
Albumin: 2.1 g/dL — ABNORMAL LOW (ref 3.5–5.0)
Alkaline Phosphatase: 75 U/L (ref 38–126)
Anion gap: 8 (ref 5–15)
BILIRUBIN TOTAL: 0.2 mg/dL — AB (ref 0.3–1.2)
BUN: 11 mg/dL (ref 6–20)
CALCIUM: 8.2 mg/dL — AB (ref 8.9–10.3)
CO2: 23 mmol/L (ref 22–32)
CREATININE: 1.45 mg/dL — AB (ref 0.61–1.24)
Chloride: 105 mmol/L (ref 101–111)
GFR calc Af Amer: >60 mL/min (ref >60)
GFR, EST NON AFRICAN AMERICAN: 52 mL/min — AB (ref >60)
Glucose, Bld: 104 mg/dL — ABNORMAL HIGH (ref 65–99)
Potassium: 3.9 mmol/L (ref 3.5–5.1)
Sodium: 136 mmol/L (ref 135–145)
TOTAL PROTEIN: 6.5 g/dL (ref 6.5–8.1)

## 2015-03-25 LAB — PROTIME-INR
INR: 2.38 — AB (ref 0.00–1.49)
Prothrombin Time: 25.7 seconds — ABNORMAL HIGH (ref 11.6–15.2)

## 2015-03-25 MED ORDER — BACITRACIN ZINC 500 UNIT/GM EX OINT
TOPICAL_OINTMENT | Freq: Three times a day (TID) | CUTANEOUS | Status: DC
  Administered 2015-03-25 – 2015-03-27 (×7): via TOPICAL
  Filled 2015-03-25: qty 28.35

## 2015-03-25 MED ORDER — WARFARIN SODIUM 4 MG PO TABS
4.0000 mg | ORAL_TABLET | Freq: Once | ORAL | Status: AC
  Administered 2015-03-25: 4 mg via ORAL
  Filled 2015-03-25: qty 1

## 2015-03-25 MED ORDER — METOPROLOL TARTRATE 25 MG PO TABS: 25.0000 mg | ORAL_TABLET | Freq: Two times a day (BID) | ORAL | Status: DC

## 2015-03-25 MED ORDER — LINEZOLID 600 MG PO TABS
600.0000 mg | ORAL_TABLET | Freq: Two times a day (BID) | ORAL | Status: DC
  Administered 2015-03-25 – 2015-03-27 (×4): 600 mg via ORAL
  Filled 2015-03-25 (×5): qty 1
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
## 2015-03-25 NOTE — Progress Notes (Signed)
ANTICOAGULATION CONSULT NOTE - Initial Consult  Pharmacy Consult for Coumadin Indication: atrial fibrillation  Allergies  Allergen Reactions  . Tomato Other (See Comments)    Causes acid reflux    Labs:  Recent Labs  03/23/15 0232 03/24/15 0527 03/25/15 0511  HGB 8.1* 8.0*  --   HCT 25.0* 25.0*  --   PLT 370 355  --   LABPROT 21.7* 22.1* 25.7*  INR 1.90* 1.95* 2.38*  CREATININE 3.09* 1.93*  --      Assessment: 59yo male presents w/ lethargy and tinnitus, UA is abnormal w/ h/o complex UTI, SCr is acutely elevated, admitted for further w/u, to continue Coumadin for Afib; current INR slightly below goal, last dose of Coumadin taken 8/24.   INR therapeutic at 2.38 dose of warfarin 6mg .   Also receiving Linezolid IV for UTI.    The patient is not neutropenic and does not exhibit a GI malabsorption state  The patient is eating (either orally or via tube) and/or has been taking other orally administered medications for a least 24 hours  The patient is improving clinically and has a Tmax < 100.5  Goal of Therapy:  INR 2-3   Plan:  Warfarin 4mg  tonight x1 per home dose Stop Heparin SQ Daily INR/CBC Monitor s/sx of bleeding Change linezolid to oral today  Heide Guile, PharmD, BCPS-AQ ID Clinical Pharmacist Pager [phone]   03/25/2015,10:17 AM

## 2015-03-25 NOTE — Progress Notes (Signed)
  Subjective: Patient reportshe has more energy today. Creatinine has improved to 1.45 today.  Objective: Vital signs in last 24 hours: Temp:  [98.3 F (36.8 C)] 98.3 F (36.8 C) (08/28 0449) Pulse Rate:  [70-81] 70 (08/28 0449) Resp:  [21-22] 21 (08/28 0449) BP: (163-178)/(90-92) 163/90 mmHg (08/28 0449) SpO2:  [99 %-100 %] 99 % (08/28 0449)  Intake/Output from previous day: 08/27 0701 - 08/28 0700 In: 1606.7 [P.O.:600; I.V.:1006.7] Out: 1950 [Urine:1950] Intake/Output this shift:    Physical Exam:  General:alert, cooperative and appears stated age GI: soft, non tender, normal bowel sounds, no palpable masses, no organomegaly, no inguinal hernia Male genitalia: no penile lesions or discharge no testicular masses Resp: clear to auscultation bilaterally Cardio: regular rate and rhythm, S1, S2 normal, no murmur, click, rub or gallop  Lab Results:  Recent Labs  03/23/15 0232 03/24/15 0527  HGB 8.1* 8.0*  HCT 25.0* 25.0*   BMET  Recent Labs  03/24/15 0527 03/25/15 1149  NA 135 136  K 4.4 3.9  CL 102 105  CO2 23 23  GLUCOSE 91 104*  BUN 22* 11  CREATININE 1.93* 1.45*  CALCIUM 8.0* 8.2*    Recent Labs  03/23/15 0232 03/24/15 0527 03/25/15 0511  INR 1.90* 1.95* 2.38*   No results for input(s): LABURIN in the last 72 hours. Results for orders placed or performed during the hospital encounter of 03/23/15  MRSA PCR Screening     Status: None   Collection Time: 03/23/15  3:28 PM  Result Value Ref Range Status   MRSA by PCR NEGATIVE NEGATIVE Final    Comment:        The GeneXpert MRSA Assay (FDA approved for NASAL specimens only), is one component of a comprehensive MRSA colonization surveillance program. It is not intended to diagnose MRSA infection nor to guide or monitor treatment for MRSA infections.     Studies/Results: No results found.  Assessment/Plan: 59yo with metastatic TCC, ARF, mild hydronephrosis  Recs: 1. Please continue gentle  hydration. ARF resolved.  2. Continue foley to straight drain 3. Urology to continue to follow  LOS: 2 days   MCKENZIE, PATRICK L 03/25/2015, 8:54 PM

## 2015-03-25 NOTE — Consult Note (Signed)
WOC ostomy consult note:  Patient is well known to our department.  This Probation officer saw him during his past admission, approximately 1 month ago. Stoma type/location: LLQ Colosotmy Stomal assessment/size: 1 and 5/8 inch round, moist, protruding (no prolapsed), pink Peristomal assessment: Not seen today as patient has no other ostomy pouches in room.  Will order and ask RN to change as the current system was inadvertently cut upon application Treatment options for stomal/peristomal skin: None Output brown stool Ostomy pouching: 2pc. 2 and 3/4 inch pouching system.  Current system (2 and 1/4 inch) is slightly small. Education provided: Patient is independent in his ostomy care Enrolled patient in [place] program: No  WOC wound consult note Reason for Consult:Patient with L AKA that had delayed wound healing at one point but that is now closed.  The buttocks have had several pressure ulcers in the past, repaired surgically in [place],  but none currently are open. Area of previous pressure ulceration on the occiput, now healed. Patient uses Vitamin E oil once daily and turns and repositions every hour to prevent pressure injuries. Wound type:No current open ulcers Pressure Ulcer POA: No Measurement: None open at this time Wound JEH:UDJS Drainage (amount, consistency, odor) None Periwound:Intact with evidence of previous wound healing (scarring) Dressing procedure/placement/frequency:None required at this time.  Thank you for this consultation on a patient well known to our team.  [place] nursing team will not follow, but will remain available to this patient, the nursing and medical team.  Please re-consult if needed. Thanks, Maudie Flakes, MSN, RN, [place], [place], [place] [phone])

## 2015-03-25 NOTE — Clinical Social Work Note (Signed)
Clinical Social Work Assessment  Patient Details  Name: Samuel [name] MRN: [id number] Date of Birth: 1955-08-04  Date of referral:                  Reason for consult:  Facility Placement                Permission sought to share information with:  Facility Art therapist granted to share information::  No  Name::        Agency::     Relationship::     Contact Information:     Housing/Transportation Living arrangements for the past 2 months:  [place] of Information:  Patient Patient Interpreter Needed:  None Criminal Activity/Legal Involvement Pertinent to Current Situation/Hospitalization:  No - Comment as needed Significant Relationships:  Adult Children Lives with:  Facility Resident Do you feel safe going back to the place where you live?  Yes Need for family participation in patient care:  No (Coment)  Care giving concerns:  None identified.  Social Worker assessment / plan:  CSW met with pt to discuss the role of CSW/discharge planning.  Pt is from [facility] and plans on returning there at d/c.  CSW to follow and facilitate NH as appropriate. Employment status:  Disabled (Comment on whether or not currently receiving Disability) Insurance information:  Medicaid In [place], [place] PT Recommendations:  Not assessed at this time Information / Referral to community resources:  [place]  Patient/Family's Response to care: Agreeable to returning to NH.  Pt is excited because he will have a roommate and 40 inch TV there when he returns.  Patient/Family's Understanding of and Emotional Response to Diagnosis, Current Treatment, and Prognosis:  Pt states that he had to come into the hospital to get ABX for his infection, because the NH "can't give him these meds."  Pt is also glad that he has a port through which he can get the ABX so he doesn't have to get them through an IV. Emotional Assessment Appearance:   stated  age Attitude/Demeanor/Rapport:   (pleasant) Affect (typically observed):  Appropriate Orientation:  Oriented to Self, Oriented to Place, Oriented to  Time, Oriented to Situation Alcohol / Substance use:  Not Applicable Psych involvement (Current and /or in the community):  No (Comment)  Discharge Needs  Concerns to be addressed:  No discharge needs identified Readmission within the last 30 days:  Yes Current discharge risk:  None Barriers to Discharge:  No Barriers Identified   Haron Beilke M, LCSW 03/25/2015, 3:17 PM

## 2015-03-26 ENCOUNTER — Inpatient Hospital Stay (HOSPITAL_COMMUNITY): Payer: Medicare Other

## 2015-03-26 DIAGNOSIS — M869 Osteomyelitis, unspecified: Secondary | ICD-10-CM

## 2015-03-26 DIAGNOSIS — D630 Anemia in neoplastic disease: Secondary | ICD-10-CM

## 2015-03-26 DIAGNOSIS — T8351XA Infection and inflammatory reaction due to indwelling urinary catheter, initial encounter: Principal | ICD-10-CM

## 2015-03-26 DIAGNOSIS — N390 Urinary tract infection, site not specified: Secondary | ICD-10-CM

## 2015-03-26 DIAGNOSIS — N179 Acute kidney failure, unspecified: Secondary | ICD-10-CM

## 2015-03-26 DIAGNOSIS — C680 Malignant neoplasm of urethra: Secondary | ICD-10-CM

## 2015-03-26 DIAGNOSIS — D649 Anemia, unspecified: Secondary | ICD-10-CM

## 2015-03-26 DIAGNOSIS — L89159 Pressure ulcer of sacral region, unspecified stage: Secondary | ICD-10-CM

## 2015-03-26 LAB — COMPREHENSIVE METABOLIC PANEL
ALBUMIN: 2 g/dL — AB (ref 3.5–5.0)
ALT: 7 U/L — ABNORMAL LOW (ref 17–63)
ANION GAP: 7 (ref 5–15)
AST: 12 U/L — AB (ref 15–41)
Alkaline Phosphatase: 68 U/L (ref 38–126)
BUN: 10 mg/dL (ref 6–20)
CHLORIDE: 104 mmol/L (ref 101–111)
CO2: 23 mmol/L (ref 22–32)
Calcium: 8.1 mg/dL — ABNORMAL LOW (ref 8.9–10.3)
Creatinine, Ser: 1.44 mg/dL — ABNORMAL HIGH (ref 0.61–1.24)
GFR calc Af Amer: >60 mL/min (ref >60)
GFR calc non Af Amer: 53 mL/min — ABNORMAL LOW (ref >60)
GLUCOSE: 86 mg/dL (ref 65–99)
POTASSIUM: 3.9 mmol/L (ref 3.5–5.1)
SODIUM: 134 mmol/L — AB (ref 135–145)
TOTAL PROTEIN: 6.5 g/dL (ref 6.5–8.1)
Total Bilirubin: 0.4 mg/dL (ref 0.3–1.2)

## 2015-03-26 LAB — CBC
HEMATOCRIT: 21.4 % — AB (ref 39.0–52.0)
HEMOGLOBIN: 7 g/dL — AB (ref 13.0–17.0)
MCH: 27 pg (ref 26.0–34.0)
MCHC: 32.7 g/dL (ref 30.0–36.0)
MCV: 82.6 fL (ref 78.0–100.0)
Platelets: 335 10*3/uL (ref 150–400)
RBC: 2.59 MIL/uL — ABNORMAL LOW (ref 4.22–5.81)
RDW: 17.9 % — ABNORMAL HIGH (ref 11.5–15.5)
WBC: 6.6 10*3/uL (ref 4.0–10.5)

## 2015-03-26 LAB — PROTIME-INR
INR: 3 — AB (ref 0.00–1.49)
PROTHROMBIN TIME: 30.6 s — AB (ref 11.6–15.2)

## 2015-03-26 MED ORDER — LIDOCAINE 5 % EX PTCH: 2.0000 | MEDICATED_PATCH | CUTANEOUS | Status: DC

## 2015-03-26 MED ORDER — LIDOCAINE 5 % EX PTCH
2.0000 | MEDICATED_PATCH | CUTANEOUS | Status: DC
  Administered 2015-03-26: 2 via TRANSDERMAL

## 2015-03-26 MED ORDER — TECHNETIUM TC 99M MEDRONATE IV KIT
25.0000 | PACK | Freq: Once | INTRAVENOUS | Status: AC | PRN
  Administered 2015-03-26: 26.6 via INTRAVENOUS

## 2015-03-26 MED ORDER — AMLODIPINE BESYLATE 5 MG PO TABS
5.0000 mg | ORAL_TABLET | Freq: Every day | ORAL | Status: DC
  Administered 2015-03-26 – 2015-03-27 (×2): 5 mg via ORAL
  Filled 2015-03-26 (×2): qty 1

## 2015-03-26 MED ORDER — WARFARIN SODIUM 2.5 MG PO TABS
2.5000 mg | ORAL_TABLET | Freq: Once | ORAL | Status: AC
  Administered 2015-03-26: 2.5 mg via ORAL
  Filled 2015-03-26: qty 1
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
## 2015-03-26 NOTE — Progress Notes (Addendum)
Pt has no complaints. Feels better.    PE: NAD GU - foley in place, urine clear  Intake/Output Summary (Last 24 hours) at 03/26/15 1800 Last data filed at 03/26/15 1627  Gross per 24 hour  Intake   2602 ml  Output   5400 ml  Net  -2798 ml   I reviewed U/S images.   A/P:   1) Acute on CRF - improved with hydration.  2) Hydronephrosis - some hydro expected with stents. I upsized them to 7Fr at last stent change. Next step would be bilateral nephrostomy tubes, but with good UOP and normalizing Cr nearing his baseline, he does not need them yet.   3) Locally advanced and metastatic urothelial Ca - appreciate Dr. Hazeline Junker input. Pt has not had recurrent gross hematuria after I fulgurated some of the tumor at his last stent change.

## 2015-03-26 NOTE — Progress Notes (Signed)
Patient ID: Samuel [name], male   DOB: 09-Aug-1955, 59 y.o.   MRN: [id number]                PROGRESS NOTE  DATE:  03/20/2015       FACILITY: [facility]                  LEVEL OF CARE:   SNF   Acute Visit          CHIEF COMPLAINT:  Follow up seizure over the weekend.     HISTORY OF PRESENT ILLNESS:  I have been called several times by the [facility] nurse practitioner about this patient.  Yesterday, it was reported that over the weekend, the patient was observed to have a generalized seizure lasting roughly four minutes.  He was initially fine, but then had some postictal drowsiness and slurring of speech.  He apparently recovered after this.    Looking back through his history, he does carry a diagnosis of seizures.  In fact, he says that he had a seizure in hospital this year sometime, although I really do not see that.    He was recently hospitalized from 02/17/2015 through 02/22/2015 with pneumonia, delirium.  He had VRE in his urine.  During that hospitalization, a lot of his psychoactive medications were reduced including his Lamictal.  Klonopin was discontinued.  He was no longer on Seroquel.  [facility] has reintroduced some of these medications.  Of note, the Lamictal itself, I think, was being used as a mood modulator by Psychiatry rather than for seizure.    CURRENT MEDICATIONS:  Medication list is reviewed.          Klonopin 0.25 b.i.d.     Baclofen 5 mg p.o. q.a.m. and 10 mg p.o. q.p.m. p.r.n.    Seroquel 50 mg p.o. q.a.m. and 300 q.h.s.      Dexilant 60 q.d.      Senokot 8.6 q.a.m.      Vitamin B12, 1000 mcg daily.    Colace 100 b.i.d.     Ferrous sulfate 325 b.i.d.      Lamictal 200 b.i.d.     Lopressor 25 b.i.d.      Lipitor 10 q.d.      Lyrica 25 three times a day.     Pepcid 20 q.d.        Vitamin D3, 50,000 U monthly.    LABORATORY DATA:   Lab work from 03/19/2015:      Sodium 137, potassium 4.9, BUN 28, creatinine 2.62.    Vitamin B12 greater  than 1000, folate greater than 20, ferritin at 343.  Iron low at 16, TIBC low at 172.    White count 8.6, hemoglobin 8.2, platelet count 417,000.    REVIEW OF SYSTEMS:    GENERAL:  The patient denies fever or chills.   HEENT:  No headache or dizziness.   CHEST/RESPIRATORY:  No cough.  No sputum.   CARDIAC:  No chest pain.    GI:  No abdominal pain.  No diarrhea in his ostomy bag.     GU:  Foley catheter in place.   NEUROLOGICAL:  He does not complain of focal weakness or numbness.   PSYCHIATRIC:  Mental status:  Staff note increasing confusion today.    PHYSICAL EXAMINATION:   VITAL SIGNS:     02 SATURATIONS:  95% on room air.   GENERAL APPEARANCE:  The patient is not in any distress, although he once again has this lethargic look.  When you speak to him, he will focus temporarily but then becomes somewhat listless-looking.   HEENT:   EYES:  Pupils are equal and reactive to light.  There are no meningeal signs.   MOUTH/THROAT:  Oral exam is normal.   LYMPHATICS:  None palpable in the cervical, clavicular, or axillary regions.   CHEST/RESPIRATORY:  Clear air entry bilaterally.    CARDIOVASCULAR:   CARDIAC:  Heart sounds are normal.  He appears to be euvolemic.      GASTROINTESTINAL:   ABDOMEN:  Multiple surgical scars.  No tenderness.  No masses.   Bowel sounds are positive.   LIVER/SPLEEN/KIDNEYS:  No liver, no spleen.  He has no asterixis.   NEUROLOGICAL:    CRANIAL NERVES:  Extraocular movements are normal.  Cranial nerves #5, #7, #12, #9, and #10 all appear to be intact.   MOTOR:  He has no pronator drift.     SENSATION/STRENGTH:  Strength in his lower extremities is normal.  His left above-knee amputation has normal strength in the hip musculature.   PSYCHIATRIC:   MENTAL STATUS:  Essentially as stated above.  He is alert, cognizant.  However, he will fade off in the middle of a conversation and then has to refocus.  In the past, this has often meant that some degree of infection  is present.    ASSESSMENT/PLAN:         Seizure on the weekend.  He is on Lamictal, which has recently been increased.  I do not feel strongly about imaging at this point.  He was last imaged in November.  There are no other obvious causes of this.  For now, we will continue to watch.  Another seizure may mean more treatment and CNS imaging as well as outpatient EEG.     Incipient delirium.  Once again, this patient is showing issues with regards to this.  I think we are going to need to cut back on some of his medications.  He has presented this way with impending infections, also.    Acute on chronic renal failure.   This will need to be followed.  I do not see an obvious cause of this.  He did have his stents changed three weeks ago while he was in hospital and I think this is the most likely cause. Could be mildly prerenal   Anemia.  This seems to be stable, with the work-up looking mostly like chronic disease.

## 2015-03-26 NOTE — Care Management Important Message (Signed)
Important Message  Patient Details  Name: Samuel [name] MRN: [id number] Date of Birth: [birth year]   Medicare Important Message Given:  Yes-fourth notification given    RoyalRory Percy, RN 03/26/2015, 10:55 AM

## 2015-03-26 NOTE — Progress Notes (Addendum)
[facility] Indication: on PTA for hx PE  Allergies  Allergen Reactions  . Tomato Other (See Comments)    Causes acid reflux    Labs:  Recent Labs  03/24/15 0527 03/25/15 0511 03/25/15 1149 03/26/15 0400  HGB 8.0*  --   --  7.0*  HCT 25.0*  --   --  21.4*  PLT 355  --   --  335  LABPROT 22.1* 25.7*  --  30.6*  INR 1.95* 2.38*  --  3.00*  CREATININE 1.93*  --  1.45* 1.44*     Assessment: 59yo male presented w/ lethargy and tinnitus, UA was abnormal w/ h/o complex UTI.  Oncoumadin for hx PE - home dose 4 mg daily.  INR therapeutic at 3 with 5 sec jump in protime most likey due to doses of 6 mg and 5 mg 8/26 and 8/27.  No bleeding reported. Hg 7, pltc wnl.   Goal of Therapy:  INR 2-3   Plan:  Warfarin 2.5 mg tonight x 1 Daily INR Monitor s/sx of bleeding  Eudelia Bunch, Pharm.D. [phone] 03/26/2015 8:53 AM

## 2015-03-26 NOTE — Progress Notes (Signed)
IP PROGRESS NOTE  Subjective:   59 year old gentleman known to me with advanced bladder cancer. He is receiving systemic chemotherapy last of which on 01/03/2015.  Since his last chemotherapy, he has had multiple hospitalizations for altered mental status, renal insufficiency and osteomyelitis. His most recent hospitalization started on 03/23/2015 when he presented with acute renal failure and altered mental status. After vigorous hydration is appeared that he has improved at this time.  Clinically, he is awake and alert and is not reporting any complaints. He has not reported any confusion or headaches. He does not report any fevers or chills or sweats. He does not report any chest pain or palpitation. He does report some occasional cramps in his leg. He does not report any abdominal pain or diarrhea. He does not report any hematuria. Remaining review of systems unremarkable.  Objective:  Vital signs in last 24 hours: Temp:  [97.7 F (36.5 C)-98.6 F (37 C)] 97.7 F (36.5 C) (08/29 1003) Pulse Rate:  [65-79] 79 (08/29 1003) Resp:  [18-20] 19 (08/29 1003) BP: (151-183)/(84-92) 183/92 mmHg (08/29 1003) SpO2:  [97 %-100 %] 100 % (08/29 1003) Weight change:  Last BM Date: 03/25/15  Intake/Output from previous day: 08/28 0701 - 08/29 0700 In: 3872 [P.O.:1542; I.V.:2330] Out: 3200 [Urine:3200] Alert, awake gentleman oriented to person and place. He is confused about date at this time. Mouth: mucous membranes moist, pharynx normal without lesions Resp: clear to auscultation bilaterally Cardio: regular rate and rhythm, S1, S2 normal, no murmur, click, rub or gallop GI: soft, non-tender; bowel sounds normal; no masses,  no organomegaly Extremities: extremities normal, atraumatic, no cyanosis or edema   Lab Results:  Recent Labs  03/24/15 0527 03/26/15 0400  WBC 8.4 6.6  HGB 8.0* 7.0*  HCT 25.0* 21.4*  PLT 355 335    BMET  Recent Labs  03/25/15 1149 03/26/15 0400  NA 136  134*  K 3.9 3.9  CL 105 104  CO2 23 23  GLUCOSE 104* 86  BUN 11 10  CREATININE 1.45* 1.44*  CALCIUM 8.2* 8.1*    CLINICAL DATA: Paraplegic due to remote spinal cord injury, post resection of 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 Hematuria and sepsis. Chronic indwelling Foley catheter with numerous urinary tract infections. Surgical history also includes left hip disarticulation with flap, appendectomy, and resection of the sigmoid colon and rectum with descending colostomy.  EXAM: CT ABDOMEN AND PELVIS WITHOUT CONTRAST  TECHNIQUE: Multidetector CT imaging of the abdomen and pelvis was performed following the standard protocol without IV contrast.  COMPARISON: 09/25/2014 and earlier.  FINDINGS: Hepatobiliary: Liver normal in size and appearance for the unenhanced technique. Distended gallbladder measuring up to 13 cm in length. Calcified gallstones within the gallbladder. No pericholecystic inflammation. No biliary ductal dilation.  Spleen: Normal in size and appearance.  Pancreas: Moderate atrophy with fatty replacement. No focal pancreatic parenchymal abnormality.  Adrenal glands: Normal in appearance.  Genitourinary: Chronic bilateral hydronephrosis with double-J ureteral stents in place. The double-J stents have their proximal pigtail in the renal pelves and the distal pigtail in the urinary bladder. No evidence of urinary tract calculi. Renal cortex remains well preserved. Exophytic cortical cyst arising from the lower pole right kidney, unchanged. No significant focal parenchymal abnormality, allowing for the unenhanced technique. Urinary bladder decompressed by Foley catheter. Asymmetric soft tissue along the left lateral and posterior bladder wall is still present but less  prominent than on the most recent  prior examination.  Prostate gland moderately enlarged, unchanged. Seminal vesicles atrophic.  Gastrointestinal: Moderate-sized hiatal hernia, unchanged. Stomach decompressed and otherwise unremarkable. Normal-appearing small bowel. Large stool burden throughout normal appearing colon. Satisfactory appearance to the descending sigmoid colostomy. Cecum positioned in the right upper quadrant, and normal-appearing appendix identified in the right upper quadrant anterior to the right kidney.  Ascites: Absent.  Vascular: Mild aorto-iliofemoral atherosclerosis without aneurysm.  Lymphatic: Right external iliac chain nodal mass, immediately adjacent to the right external iliac vein, with maximum axial measurements approximating 4.6 x 2.2 cm, slightly decreased in size since the most recent prior examination. No new or enlarging lymphadenopathy elsewhere.  Other findings: Umbilical hernia containing the gas-filled transverse colon. Extensive scarring in the low pelvis related to the prior surgery. Decubitus ulcer involving the right buttock with a probable subcutaneous abscess measuring approximately 2.1 x 7.3 cm, with a tract extending upward to the sacrum, similar to the most recent prior examination.  Musculoskeletal: Osteomyelitis involving the sacrum, similar to the most recent prior examination. Previous left hip disarticulation. Prior ORIF of a right femoral neck fracture. Large right hip joint effusion, unchanged from the most recent prior examination. Generalized osseous demineralization. Prior posterior decompression at T12-L1.  Visualized lower thorax: Airspace consolidation in the lower lobes, left greater than right, and in the lingula. Heart size upper normal. No pericardial effusion. Moderate bilateral gynecomastia.  IMPRESSION: When compared to the most recent CT 09/25/2014:  1. New airspace consolidation in the lower lobes bilaterally and in the  lingula consistent with pneumonia. 2. Stable bilateral hydronephrosis with double-J ureteral stents in place. 3. Decubitus ulcer involving the right buttock with a tract extending upward to the sacrum, with evidence of sacral osteomyelitis, unchanged. 4. Large right hip joint effusion, unchanged. 5. Primary bladder tumor along the left lateral wall and posterior wall, slightly improved. 6. Metastatic right external iliac lymphadenopathy, slightly improved. 7. Distended gallbladder with cholelithiasis. No CT evidence for acute cholecystitis. 8. Stable hiatal hernia. 9. Satisfactory appearance of the descending colostomy. 10. Stable umbilical hernia containing the gas-filled transverse colon.    EXAM: RENAL / URINARY TRACT ULTRASOUND COMPLETE  COMPARISON: Prior CT from 02/18/2015.  FINDINGS: Examination is suboptimal due to body habitus.  Right Kidney:  Length: 9.9 cm. Echogenicity within normal limits. Probable mild hydronephrosis present.  Left Kidney:  Length: 11.4 cm. Echogenicity within normal limits. Probable mild hydronephrosis present. Possible small 9 mm echogenic focus at the mid to lower pole, which may reflect a small nonobstructive stone.  Bladder:  Appears normal for degree of bladder distention.  IMPRESSION: 1. Suboptimal exam due to body habitus. 2. Findings suggestive of mild bilateral hydronephrosis. 3. Question 9 mm focus at the mid - lower pole left kidney, which may reflect a small stone.   Medications: I have reviewed the patient's current medications.  Assessment/Plan:  59 year old gentleman with the following issues:  1. 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 He was treated with carboplatin and gemcitabine for a brief period of time and therapy was interrupted due to poor tolerance, multiple hospitalizations among others. His CT scan in July 2016 was reviewed and  showed some response to chemotherapy.  The role of chemotherapy in him is certainly palliative. At this time, I do not see that he is a candidate to restart chemotherapy in the near future. Given his recent renal failure and osteomyelitis it would be risky to restart systemic therapy.  I do not see him receiving systemic chemotherapy anytime soon but certainly the option is not completely ruled out in the future.  It would be reasonable to consider palliative medicine evaluation to help with goals of care at some point.  I will address his chemotherapy candidacy in the future as he continues to improve clinically following forward.  2. Acute renal failure: Seems to be improving with creatinine down from 3.09 to 1.44. Renal ultrasound reviewed and showed questionable mild hydronephrosis. Urology is following at this time.  3. Anemia: Likely related to renal failure and possible malignancy. I agree with supportive management and transfusion as needed.  Please call with any questions regarding this gentleman's care.   LOS: 3 days   Murrel Freet 03/26/2015, 10:32 AM

## 2015-03-27 LAB — BASIC METABOLIC PANEL
ANION GAP: 6 (ref 5–15)
BUN: 8 mg/dL (ref 6–20)
CHLORIDE: 104 mmol/L (ref 101–111)
CO2: 24 mmol/L (ref 22–32)
CREATININE: 1.46 mg/dL — AB (ref 0.61–1.24)
Calcium: 8.2 mg/dL — ABNORMAL LOW (ref 8.9–10.3)
GFR calc non Af Amer: 52 mL/min — ABNORMAL LOW (ref >60)
GFR, EST AFRICAN AMERICAN: 60 mL/min — AB (ref >60)
Glucose, Bld: 97 mg/dL (ref 65–99)
POTASSIUM: 3.8 mmol/L (ref 3.5–5.1)
SODIUM: 134 mmol/L — AB (ref 135–145)

## 2015-03-27 LAB — CBC
HCT: 23.1 % — ABNORMAL LOW (ref 39.0–52.0)
Hemoglobin: 7.3 g/dL — ABNORMAL LOW (ref 13.0–17.0)
MCH: 26.3 pg (ref 26.0–34.0)
MCHC: 31.6 g/dL (ref 30.0–36.0)
MCV: 83.1 fL (ref 78.0–100.0)
PLATELETS: 322 10*3/uL (ref 150–400)
RBC: 2.78 MIL/uL — AB (ref 4.22–5.81)
RDW: 17.9 % — ABNORMAL HIGH (ref 11.5–15.5)
WBC: 6.4 10*3/uL (ref 4.0–10.5)

## 2015-03-27 LAB — PROTIME-INR
INR: 3.35 — ABNORMAL HIGH (ref 0.00–1.49)
Prothrombin Time: 33.3 seconds — ABNORMAL HIGH (ref 11.6–15.2)

## 2015-03-27 MED ORDER — OXYBUTYNIN CHLORIDE ER 5 MG PO TB24: 5.0000 mg | ORAL_TABLET | Freq: Every morning | ORAL | Status: DC

## 2015-03-27 MED ORDER — CLONAZEPAM 0.5 MG PO TABS: ORAL_TABLET | ORAL | Status: DC

## 2015-03-27 MED ORDER — HEPARIN SOD (PORK) LOCK FLUSH 100 UNIT/ML IV SOLN
500.0000 [IU] | INTRAVENOUS | Status: AC | PRN
  Administered 2015-03-27: 500 [IU]

## 2015-03-27 MED ORDER — SODIUM CHLORIDE 0.9 % IJ SOLN
10.0000 mL | INTRAMUSCULAR | Status: DC | PRN
  Administered 2015-03-27 (×2): 10 mL
  Filled 2015-03-27: qty 40

## 2015-03-27 MED ORDER — TRAZODONE HCL 50 MG PO TABS: 50.0000 mg | ORAL_TABLET | Freq: Every day | ORAL | Status: DC

## 2015-03-27 MED ORDER — WARFARIN SODIUM 4 MG PO TABS: 4.0000 mg | ORAL_TABLET | Freq: Every day | ORAL | Status: DC

## 2015-03-27 MED ORDER — BACLOFEN 10 MG PO TABS: 5.0000 mg | ORAL_TABLET | Freq: Three times a day (TID) | ORAL | Status: DC

## 2015-03-27 MED ORDER — SILVER SULFADIAZINE 1 % EX CREA: TOPICAL_CREAM | Freq: Two times a day (BID) | CUTANEOUS | Status: DC

## 2015-03-27 MED ORDER — AMLODIPINE BESYLATE 5 MG PO TABS: 5.0000 mg | ORAL_TABLET | Freq: Every day | ORAL | Status: DC

## 2015-03-27 MED ORDER — LINEZOLID 600 MG PO TABS: 600.0000 mg | ORAL_TABLET | Freq: Two times a day (BID) | ORAL | Status: DC

## 2015-03-27 MED ORDER — OXYCODONE HCL 5 MG PO TABS: 5.0000 mg | ORAL_TABLET | ORAL | Status: DC | PRN

## 2015-03-27 NOTE — Clinical Social Work Note (Signed)
Patient medically stable for discharge back to [facility] skilled facility today. Facility informed and d/c summary transmitted to admissions staff. Patient will be transported by ambulance - [facility]. Attempted to reach patient's daughters, [name] and Allison Silva regarding patient's discharge and message left.   Benedicta Sultan Givens, MSW, LCSW Licensed Clinical Social Worker [place] [phone]

## 2015-03-27 NOTE — Discharge Instructions (Signed)

## 2015-03-27 NOTE — Progress Notes (Signed)
[name] to be D/C'd Skilled nursing facility per MD order.  Called report to [employer].       Medication List    STOP taking these medications        traMADol 50 MG tablet  Commonly known as:  ULTRAM      TAKE these medications        acetaminophen 325 MG tablet  Commonly known as:  TYLENOL  Take 650 mg by mouth every 6 (six) hours as needed for moderate pain.     alum & mag hydroxide-simeth 097-353-29 MG/5ML suspension  Commonly known as:  MAALOX PLUS  Take 20 mLs by mouth every 6 (six) hours as needed for indigestion.     amLODipine 5 MG tablet  Commonly known as:  NORVASC  Take 1 tablet (5 mg total) by mouth daily.     atorvastatin 10 MG tablet  Commonly known as:  LIPITOR  Take 10 mg by mouth daily at 6 PM.     baclofen 10 MG tablet  Commonly known as:  LIORESAL  Take 0.5 tablets (5 mg total) by mouth 3 (three) times daily.     Cholecalciferol 50000 UNITS capsule  Take 50,000 Units by mouth every 30 (thirty) days.     clonazePAM 0.5 MG tablet  Commonly known as:  KLONOPIN  Take 1/2 tablet by mouth twice daily     cyanocobalamin 1000 MCG tablet  Take 100 mcg by mouth daily.     DEXILANT 60 MG capsule  Generic drug:  dexlansoprazole  Take 60 mg by mouth daily.     docusate sodium 100 MG capsule  Commonly known as:  COLACE  Take 100 mg by mouth 2 (two) times daily.     famotidine 20 MG tablet  Commonly known as:  PEPCID  Take 20 mg by mouth at bedtime.     ferrous sulfate 325 (65 FE) MG EC tablet  Take 325 mg by mouth 2 (two) times daily.     folic acid 1 MG tablet  Commonly known as:  FOLVITE  Take 1 mg by mouth daily.     guaiFENesin 100 MG/5ML Soln  Commonly known as:  ROBITUSSIN  Take 15 mLs by mouth 3 (three) times daily as needed for cough or to loosen phlegm.     lactose free nutrition Liqd  Take 237 mLs by mouth 2 (two) times daily between meals.     lactulose 10 GM/15ML solution  Commonly known as:  CHRONULAC  Take 20 g by  mouth 4 (four) times daily.     lamoTRIgine 200 MG tablet  Commonly known as:  LAMICTAL  Take 0.5 tablets (100 mg total) by mouth 2 (two) times daily.     lidocaine 5 %  Commonly known as:  LIDODERM  Place 2 patches onto the skin as needed (for pain). Remove & Discard patch within 12 hours or as directed by MD (apply 2 patches to left stump daily at 9pm     linezolid 600 MG tablet  Commonly known as:  ZYVOX  Take 1 tablet (600 mg total) by mouth every 12 (twelve) hours. For 10 more days     metoprolol tartrate 25 MG tablet  Commonly known as:  LOPRESSOR  Take 1 tablet (25 mg total) by mouth 2 (two) times daily.     nitroGLYCERIN 0.4 MG SL tablet  Commonly known as:  NITROSTAT  Place 0.4 mg under the tongue every 5 (five) minutes as needed for chest  pain.     ondansetron 4 MG tablet  Commonly known as:  ZOFRAN  Take 4 mg by mouth every 8 (eight) hours as needed for nausea or vomiting.     ondansetron 8 MG disintegrating tablet  Commonly known as:  ZOFRAN-ODT  Take 8 mg by mouth every 8 (eight) hours as needed for nausea or vomiting.     oxybutynin 5 MG 24 hr tablet  Commonly known as:  DITROPAN-XL  Take 1 tablet (5 mg total) by mouth every morning.     oxyCODONE 5 MG immediate release tablet  Commonly known as:  Oxy IR/ROXICODONE  Take 1 tablet (5 mg total) by mouth every 4 (four) hours as needed for moderate pain or severe pain.     pregabalin 25 MG capsule  Commonly known as:  LYRICA  Take 25 mg by mouth 3 (three) times daily.     promethazine 25 MG tablet  Commonly known as:  PHENERGAN  Take 25 mg by mouth every 8 (eight) hours as needed for nausea or vomiting.     QUEtiapine 300 MG tablet  Commonly known as:  SEROQUEL  Take 300 mg by mouth at bedtime.     senna 8.6 MG tablet  Commonly known as:  SENOKOT  Take 2 tablets by mouth every morning.     silver sulfADIAZINE 1 % cream  Commonly known as:  SILVADENE  Apply topically 2 (two) times daily.     SYSTANE  0.4-0.3 % Soln  Generic drug:  Polyethyl Glycol-Propyl Glycol  Apply 2 drops to eye 3 (three) times daily.     traZODone 50 MG tablet  Commonly known as:  DESYREL  Take 1 tablet (50 mg total) by mouth at bedtime.     warfarin 4 MG tablet  Commonly known as:  COUMADIN  Take 1 tablet (4 mg total) by mouth daily. Start on 03/29/15        Filed Vitals:   03/27/15 1000  BP: 165/88  Pulse: 81  Temp: 97.7 F (36.5 C)  Resp: 18    Patient escorted via [employer] A 03/27/2015 2:55 PM

## 2015-03-27 NOTE — Discharge Summary (Signed)
[facility]  Physician Discharge Summary   Patient ID: Samuel [name] MRN: [id number] DOB/AGE: 1955/11/28 59 y.o.  Admit date: 03/23/2015 Discharge date: 03/27/2015  PCP: [name], MD  DISCHARGE DIAGNOSES:  Principal Problem:   Altered mental status Active Problems:   Urothelial cancer   Acute kidney injury   UTI (urinary tract infection) due to urinary indwelling Foley catheter   Palliative care encounter   Normochromic normocytic anemia   Sacral decubitus ulcer   RECOMMENDATIONS FOR OUTPATIENT FOLLOW UP: 1. SNF to check PT/INR on September 1. Subsequently, per protocol. 2. Check CBC and renal function next week 3. Please increase dose of amlodipine if blood pressure is not adequately controlled.   DISCHARGE CONDITION: fair  Diet recommendation: As before  [facility] Weights   03/23/15 2023 03/26/15 2122  Weight: 100.7 kg (222 lb 0.1 oz) 101.2 kg (223 lb 1.7 oz)    INITIAL HISTORY: 59 year old male with a past medical history of paraplegia, bladder cancer, stage III, presented with altered mental status. Patient was noted to be in acute renal failure. He was noted to have an acute urinary tract infection. He was hospitalized for further management.  Consultations: Oncology, urology   HOSPITAL COURSE:   Vancomycin-resistant enterococcal UTI in the setting of hydronephrosis and ureteral stents After discussions with infectious disease, patient was started on Linezolid. He will complete a two-week course. He remains afebrile.  Acute metabolic encephalopathy This was possibly related to sepsis and polypharmacy. Patient on multiple medications which can cause alteration in mental status. Some of his medications were initially held and changes were made to the dosage. Mental status has improved. Appears to be back to baseline.   Essential hypertension, poorly controlled Patient was started back on his beta blocker. Blood pressure remains poorly  controlled. Amlodipine has been added. Blood pressure to be monitored closely and dose to be adjusted in the SNF.   Acute kidney injury with metabolic acidosis and bilateral hydronephrosis Doubt related to obstructive uropathy or stent failure per urology, bilateral hydronephrosis (R >L) seen on imaging but not contributing to the patient's renal failure. Renal function has improved with IV hydration. Urology was following the patient closely. They feel that some degree of hydronephrosis is to be expected. They will arrange outpatient follow-up. Patient will be discharged with Foley catheter.  High-grade urothelial carcinoma status post TURBT in March 2016 with ureteric stenting Patient reportedly had cystoscopy with stent replacement on 11/10/2014. CT on admission showed stable bilateral hydronephrosis. patient seen by urology , status post cystoscopy on 7/27 with bilateral ureteral stent exchange and fulguration of bladder tumor. Patient anxious about receiving chemotherapy. Discussed with Dr. Alen Blew who has also seen the patient. Currently not a candidate for treatment, though could be in the future. This will need outpatient follow-up. Palliative care consultation requested and the patient would like to be a full code  Chronic hypoxic respiratory failure Recent episode of pneumonia and sepsis which has now resolved.  Seizure disorder Stable. Lamictal resumed.  History of PE on chronic anticoagulation Continue warfarin. INR is supratherapeutic today. Dose will be held today and tomorrow. Recheck INR on September 1.  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 Being seen by wound care nurse. Nursing home concerned about sacral osteomyelitis. CT scan 7/24 showeddecubitus ulcer involving the right buttock with a tract extending upward to the sacrum, with evidence of sacral osteomyelitis, although this was unchanged from before. Could have chronic osteomyelitis. Bone scan was done which ruled out  osteomyelitis.   Hypokalemia/hypomagnesemia  Replenished.  Iron deficiency anemia Hemoglobin . Hemoglobin is stable. Repeat next week at the skilled nursing facility. No overt bleeding identified. Drop in hemoglobin was likely due to hemodilution.   Overall, patient remains stable. Okay for discharge back to his SNF today.   PERTINENT LABS:  The results of significant diagnostics from this hospitalization (including imaging, microbiology, ancillary and laboratory) are listed below for reference.    Microbiology: Recent Results (from the past 240 hour(s))  MRSA PCR Screening     Status: None   Collection Time: 03/23/15  3:28 PM  Result Value Ref Range Status   MRSA by PCR NEGATIVE NEGATIVE Final    Comment:        The GeneXpert MRSA Assay (FDA approved for NASAL specimens only), is one component of a comprehensive MRSA colonization surveillance program. It is not intended to diagnose MRSA infection nor to guide or monitor treatment for MRSA infections.      Labs: Basic Metabolic Panel:  Recent Labs Lab 03/23/15 0232 03/24/15 0527 03/25/15 1149 03/26/15 0400 03/27/15 0408  NA 134* 135 136 134* 134*  K 4.5 4.4 3.9 3.9 3.8  CL 101 102 105 104 104  CO2 22 23 23 23 24   GLUCOSE 96 91 104* 86 97  BUN 37* 22* 11 10 8   CREATININE 3.09* 1.93* 1.45* 1.44* 1.46*  CALCIUM 8.2* 8.0* 8.2* 8.1* 8.2*   Liver Function Tests:  Recent Labs Lab 03/23/15 0232 03/24/15 0527 03/25/15 1149 03/26/15 0400  AST 17 15 16  12*  ALT 10* 9* 8* 7*  ALKPHOS 96 84 75 68  BILITOT 0.4 0.3 0.2* 0.4  PROT 8.0 7.5 6.5 6.5  ALBUMIN 2.7* 2.3* 2.1* 2.0*    Recent Labs Lab 03/23/15 1503  AMMONIA 36*   CBC:  Recent Labs Lab 03/23/15 0232 03/24/15 0527 03/26/15 0400 03/27/15 0408  WBC 11.3* 8.4 6.6 6.4  NEUTROABS 8.2*  --   --   --   HGB 8.1* 8.0* 7.0* 7.3*  HCT 25.0* 25.0* 21.4* 23.1*  MCV 82.2 83.3 82.6 83.1  PLT 370 355 335 322    IMAGING STUDIES Ct Head Wo  Contrast  03/23/2015   CLINICAL DATA:  Lethargy. Buzzing in the ear. History of paraplegia following a spinal cord injury.  EXAM: CT HEAD WITHOUT CONTRAST  TECHNIQUE: Contiguous axial images were obtained from the base of the skull through the vertex without intravenous contrast.  COMPARISON:  10/03/2014  FINDINGS: Ventricles and sulci appear symmetrical. No mass effect or midline shift. No abnormal extra-axial fluid collections. Gray-white matter junctions are distinct. Basal cisterns are not effaced. No evidence of acute intracranial hemorrhage. No depressed skull fractures. Visualized paranasal sinuses and mastoid air cells are not opacified.  IMPRESSION: No acute intracranial abnormalities.   Electronically Signed   By: Lucienne Capers M.D.   On: 03/23/2015 02:11   US Renal  03/23/2015   CLINICAL DATA:  Initial evaluation for acute renal failure.  EXAM: RENAL / URINARY TRACT ULTRASOUND COMPLETE  COMPARISON:  Prior CT from 02/18/2015.  FINDINGS: Examination is suboptimal due to body habitus.  Right Kidney:  Length: 9.9 cm. Echogenicity within normal limits. Probable mild hydronephrosis present.  Left Kidney:  Length: 11.4 cm. Echogenicity within normal limits. Probable mild hydronephrosis present. Possible small 9 mm echogenic focus at the mid to lower pole, which may reflect a small nonobstructive stone.  Bladder:  Appears normal for degree of bladder distention.  IMPRESSION: 1. Suboptimal exam due to body habitus. 2. Findings suggestive  of mild bilateral hydronephrosis. 3. Question 9 mm focus at the mid - lower pole left kidney, which may reflect a small stone.   Electronically Signed   By: Jeannine Boga M.D.   On: 03/23/2015 06:12   Nm Bone 3 Phase Ltd  03/26/2015   CLINICAL DATA:  Evaluate for sacral osteomyelitis. Prior LEFT leg above-the-knee amputation. Decubitus ulcer in the RIGHT buttock.  EXAM: NUCLEAR MEDICINE 3-PHASE BONE SCAN  TECHNIQUE: Radionuclide angiographic images, immediate  static blood pool images, and 3-hour delayed static images were obtained of the and active the ankle without with and iliac pelvis after intravenous injection of radiopharmaceutical.  RADIOPHARMACEUTICALS:  26.6 mCi Tc-43m MDP  COMPARISON:  CT 02/18/2015  FINDINGS: Vascular phase: No increased blood flow to the sacrum on the vascular phase.  Blood pool phase: No significant abnormal blood pool activity within the pelvis.  Delayed phase: There is uptake associated with the proximal RIGHT hip in the femoral neck region associated with the internal fixation. No significant abnormal uptake associated with the sacrum.  IMPRESSION: 1. No evidence of osteomyelitis of the sacrum by the three-phase bone scintigraphy. 2. Delayed uptake within the proximal RIGHT humeral related to internal fixation.   Electronically Signed   By: Suzy Bouchard M.D.   On: 03/26/2015 16:35   Dg Chest Port 1 View  03/23/2015   CLINICAL DATA:  Lethargic all day.  EXAM: PORTABLE CHEST - 1 VIEW  COMPARISON:  02/19/2015  FINDINGS: Shallow inspiration. Cardiac enlargement without vascular congestion. Linear atelectasis in the lung bases. No pneumothorax. No blunting of costophrenic angles. Power port type central venous catheter with tip over the mid SVC region. No change since prior study.  IMPRESSION: Shallow inspiration with atelectasis in the lung bases. Mild cardiac enlargement.   Electronically Signed   By: Lucienne Capers M.D.   On: 03/23/2015 01:48    DISCHARGE EXAMINATION: Filed Vitals:   03/26/15 1003 03/26/15 2122 03/27/15 0546 03/27/15 1000  BP: 183/92 175/92 125/75 165/88  Pulse: 79 78 66 81  Temp: 97.7 F (36.5 C) 98 F (36.7 C) 98.2 F (36.8 C) 97.7 F (36.5 C)  TempSrc: Oral Oral Oral Oral  Resp: 19 18 17 18   Height:  5\' 4"  (1.626 m)    Weight:  101.2 kg (223 lb 1.7 oz)    SpO2: 100% 100% 100% 100%   General appearance: alert, cooperative, appears stated age and no distress Resp: clear to auscultation  bilaterally Cardio: regular rate and rhythm, S1, S2 normal, no murmur, click, rub or gallop GI: soft, non-tender; bowel sounds normal; no masses,  no organomegaly  DISPOSITION: SNF  Discharge Instructions    Call MD for:  difficulty breathing, headache or visual disturbances    Complete by:  As directed      Call MD for:  extreme fatigue    Complete by:  As directed      Call MD for:  persistant dizziness or light-headedness    Complete by:  As directed      Call MD for:  persistant nausea and vomiting    Complete by:  As directed      Call MD for:  redness, tenderness, or signs of infection (pain, swelling, redness, odor or green/yellow discharge around incision site)    Complete by:  As directed      Call MD for:  severe uncontrolled pain    Complete by:  As directed      Call MD for:  temperature >100.4  Complete by:  As directed      Diet - low sodium heart healthy    Complete by:  As directed      Discharge instructions    Complete by:  As directed   PT/INR on 9/1 and then per protocol.  You were cared for by a hospitalist during your hospital stay. If you have any questions about your discharge medications or the care you received while you were in the hospital after you are discharged, you can call the unit and asked to speak with the hospitalist on call if the hospitalist that took care of you is not available. Once you are discharged, your primary care physician will handle any further medical issues. Please note that NO REFILLS for any discharge medications will be authorized once you are discharged, as it is imperative that you return to your primary care physician (or establish a relationship with a primary care physician if you do not have one) for your aftercare needs so that they can reassess your need for medications and monitor your lab values. If you do not have a primary care physician, you can call [phone] for a physician referral.     Increase activity slowly     Complete by:  As directed            ALLERGIES:  Allergies  Allergen Reactions  . Tomato Other (See Comments)    Causes acid reflux      Current Discharge Medication List    START taking these medications   Details  amLODipine (NORVASC) 5 MG tablet Take 1 tablet (5 mg total) by mouth daily.    linezolid (ZYVOX) 600 MG tablet Take 1 tablet (600 mg total) by mouth every 12 (twelve) hours. For 10 more days Qty: 20 tablet, Refills: 0      CONTINUE these medications which have CHANGED   Details  baclofen (LIORESAL) 10 MG tablet Take 0.5 tablets (5 mg total) by mouth 3 (three) times daily. Qty: 30 each, Refills: 0    clonazePAM (KLONOPIN) 0.5 MG tablet Take 1/2 tablet by mouth twice daily Qty: 20 tablet, Refills: 0    oxybutynin (DITROPAN-XL) 5 MG 24 hr tablet Take 1 tablet (5 mg total) by mouth every morning.    oxyCODONE (OXY IR/ROXICODONE) 5 MG immediate release tablet Take 1 tablet (5 mg total) by mouth every 4 (four) hours as needed for moderate pain or severe pain. Qty: 30 tablet, Refills: 0    silver sulfADIAZINE (SILVADENE) 1 % cream Apply topically 2 (two) times daily. Qty: 50 g, Refills: 0    traZODone (DESYREL) 50 MG tablet Take 1 tablet (50 mg total) by mouth at bedtime. Qty: 30 tablet, Refills: 0    warfarin (COUMADIN) 4 MG tablet Take 1 tablet (4 mg total) by mouth daily. Start on 03/29/15      CONTINUE these medications which have NOT CHANGED   Details  acetaminophen (TYLENOL) 325 MG tablet Take 650 mg by mouth every 6 (six) hours as needed for moderate pain.    alum & mag hydroxide-simeth (MAALOX PLUS) 400-400-40 MG/5ML suspension Take 20 mLs by mouth every 6 (six) hours as needed for indigestion.    atorvastatin (LIPITOR) 10 MG tablet Take 10 mg by mouth daily at 6 PM.    Cholecalciferol 50000 UNITS capsule Take 50,000 Units by mouth every 30 (thirty) days.    cyanocobalamin 1000 MCG tablet Take 100 mcg by mouth daily.     dexlansoprazole (DEXILANT)  60 MG  capsule Take 60 mg by mouth daily.    docusate sodium (COLACE) 100 MG capsule Take 100 mg by mouth 2 (two) times daily.    famotidine (PEPCID) 20 MG tablet Take 20 mg by mouth at bedtime.    ferrous sulfate 325 (65 FE) MG EC tablet Take 325 mg by mouth 2 (two) times daily.    folic acid (FOLVITE) 1 MG tablet Take 1 mg by mouth daily.    guaiFENesin (ROBITUSSIN) 100 MG/5ML SOLN Take 15 mLs by mouth 3 (three) times daily as needed for cough or to loosen phlegm.    lactose free nutrition (BOOST) LIQD Take 237 mLs by mouth 2 (two) times daily between meals.    lactulose (CHRONULAC) 10 GM/15ML solution Take 20 g by mouth 4 (four) times daily.    lamoTRIgine (LAMICTAL) 200 MG tablet Take 0.5 tablets (100 mg total) by mouth 2 (two) times daily. Qty: 30 tablet, Refills: 0    lidocaine (LIDODERM) 5 % Place 2 patches onto the skin as needed (for pain). Remove & Discard patch within 12 hours or as directed by MD (apply 2 patches to left stump daily at 9pm    metoprolol tartrate (LOPRESSOR) 25 MG tablet Take 1 tablet (25 mg total) by mouth 2 (two) times daily. Qty: 60 tablet, Refills: 1    nitroGLYCERIN (NITROSTAT) 0.4 MG SL tablet Place 0.4 mg under the tongue every 5 (five) minutes as needed for chest pain.    ondansetron (ZOFRAN) 4 MG tablet Take 4 mg by mouth every 8 (eight) hours as needed for nausea or vomiting.    ondansetron (ZOFRAN-ODT) 8 MG disintegrating tablet Take 8 mg by mouth every 8 (eight) hours as needed for nausea or vomiting.    Polyethyl Glycol-Propyl Glycol (SYSTANE) 0.4-0.3 % SOLN Apply 2 drops to eye 3 (three) times daily.    pregabalin (LYRICA) 25 MG capsule Take 25 mg by mouth 3 (three) times daily.    promethazine (PHENERGAN) 25 MG tablet Take 25 mg by mouth every 8 (eight) hours as needed for nausea or vomiting.    QUEtiapine (SEROQUEL) 300 MG tablet Take 300 mg by mouth at bedtime.    senna (SENOKOT) 8.6 MG tablet Take 2 tablets by mouth every morning.       STOP taking these medications     traMADol (ULTRAM) 50 MG tablet        Follow-up Information    Follow up with [name], MD. Schedule an appointment as soon as possible for a visit in 1 week.   Specialty:  Internal Medicine   Why:  post hospitalization follow up   Contact information:   [place] [id number] [phone]       Follow up with [name], MD.   Specialty:  Urology   Why:  they will arrange follow up   Contact information:   [place] [address] [phone]       TOTAL DISCHARGE TIME: 52 minutes  [facility] Pager [phone]  03/27/2015, 12:34 PM

## 2015-03-27 NOTE — Progress Notes (Signed)
[place] for Coumadin Indication: on PTA for hx PE  Allergies  Allergen Reactions  . Tomato Other (See Comments)    Causes acid reflux    Labs:  Recent Labs  03/25/15 0511 03/25/15 1149 03/26/15 0400 03/27/15 0408  HGB  --   --  7.0* 7.3*  HCT  --   --  21.4* 23.1*  PLT  --   --  335 322  LABPROT 25.7*  --  30.6* 33.3*  INR 2.38*  --  3.00* 3.35*  CREATININE  --  1.45* 1.44* 1.46*    Assessment: 59yo male presented w/ lethargy and tinnitus, UA was abnormal w/ h/o complex UTI.  On Coumadin for hx PE - home dose 4 mg daily.   Received higher doses on 8/26 and 8/27. Reduced dose given last evening with INR of 3, INR increased today to 3.35.   No bleeding reported. Hg 7.3, pltc wnl- both stable.  Goal of Therapy:  INR 2-3   Plan:  -hold warfarin tonight so INR does not continue to rise -Daily INR -Monitor s/sx of bleeding  Niambi Smoak D. Korene Dula, PharmD, BCPS Clinical Pharmacist Pager: [phone] 03/27/2015 10:30 AM

## 2015-03-28 ENCOUNTER — Other Ambulatory Visit: Payer: Self-pay

## 2015-04-03 ENCOUNTER — Non-Acute Institutional Stay (SKILLED_NURSING_FACILITY): Payer: Medicare Other | Admitting: Internal Medicine

## 2015-04-03 ENCOUNTER — Encounter (HOSPITAL_COMMUNITY): Payer: Self-pay | Admitting: Emergency Medicine

## 2015-04-03 ENCOUNTER — Inpatient Hospital Stay (HOSPITAL_COMMUNITY)
Admission: EM | Admit: 2015-04-03 | Discharge: 2015-04-21 | DRG: 659 | Disposition: A | Discharge status: Skilled Nursing Facility | Payer: Medicare Other | Attending: Internal Medicine | Admitting: Internal Medicine

## 2015-04-03 DIAGNOSIS — D631 Anemia in chronic kidney disease: Secondary | ICD-10-CM | POA: Diagnosis present

## 2015-04-03 DIAGNOSIS — I129 Hypertensive chronic kidney disease with stage 1 through stage 4 chronic kidney disease, or unspecified chronic kidney disease: Secondary | ICD-10-CM | POA: Diagnosis present

## 2015-04-03 DIAGNOSIS — N179 Acute kidney failure, unspecified: Secondary | ICD-10-CM

## 2015-04-03 DIAGNOSIS — E785 Hyperlipidemia, unspecified: Secondary | ICD-10-CM | POA: Diagnosis present

## 2015-04-03 DIAGNOSIS — J9621 Acute and chronic respiratory failure with hypoxia: Secondary | ICD-10-CM | POA: Diagnosis not present

## 2015-04-03 DIAGNOSIS — E669 Obesity, unspecified: Secondary | ICD-10-CM | POA: Diagnosis present

## 2015-04-03 DIAGNOSIS — G8220 Paraplegia, unspecified: Secondary | ICD-10-CM | POA: Diagnosis present

## 2015-04-03 DIAGNOSIS — Z66 Do not resuscitate: Secondary | ICD-10-CM | POA: Diagnosis not present

## 2015-04-03 DIAGNOSIS — Z809 Family history of malignant neoplasm, unspecified: Secondary | ICD-10-CM | POA: Diagnosis not present

## 2015-04-03 DIAGNOSIS — D649 Anemia, unspecified: Secondary | ICD-10-CM | POA: Diagnosis not present

## 2015-04-03 DIAGNOSIS — G9340 Encephalopathy, unspecified: Secondary | ICD-10-CM | POA: Diagnosis present

## 2015-04-03 DIAGNOSIS — D500 Iron deficiency anemia secondary to blood loss (chronic): Secondary | ICD-10-CM | POA: Diagnosis present

## 2015-04-03 DIAGNOSIS — Z87891 Personal history of nicotine dependence: Secondary | ICD-10-CM

## 2015-04-03 DIAGNOSIS — Z515 Encounter for palliative care: Secondary | ICD-10-CM | POA: Diagnosis not present

## 2015-04-03 DIAGNOSIS — Z6837 Body mass index (BMI) 37.0-37.9, adult: Secondary | ICD-10-CM

## 2015-04-03 DIAGNOSIS — C689 Malignant neoplasm of urinary organ, unspecified: Secondary | ICD-10-CM

## 2015-04-03 DIAGNOSIS — Y95 Nosocomial condition: Secondary | ICD-10-CM | POA: Diagnosis present

## 2015-04-03 DIAGNOSIS — L89319 Pressure ulcer of right buttock, unspecified stage: Secondary | ICD-10-CM | POA: Diagnosis present

## 2015-04-03 DIAGNOSIS — R569 Unspecified convulsions: Secondary | ICD-10-CM | POA: Insufficient documentation

## 2015-04-03 DIAGNOSIS — K219 Gastro-esophageal reflux disease without esophagitis: Secondary | ICD-10-CM | POA: Diagnosis present

## 2015-04-03 DIAGNOSIS — Z960 Presence of urogenital implants: Secondary | ICD-10-CM | POA: Diagnosis present

## 2015-04-03 DIAGNOSIS — Z91018 Allergy to other foods: Secondary | ICD-10-CM

## 2015-04-03 DIAGNOSIS — N139 Obstructive and reflux uropathy, unspecified: Secondary | ICD-10-CM | POA: Diagnosis not present

## 2015-04-03 DIAGNOSIS — J690 Pneumonitis due to inhalation of food and vomit: Secondary | ICD-10-CM | POA: Diagnosis not present

## 2015-04-03 DIAGNOSIS — C772 Secondary and unspecified malignant neoplasm of intra-abdominal lymph nodes: Secondary | ICD-10-CM | POA: Diagnosis present

## 2015-04-03 DIAGNOSIS — C679 Malignant neoplasm of bladder, unspecified: Secondary | ICD-10-CM | POA: Diagnosis present

## 2015-04-03 DIAGNOSIS — L89899 Pressure ulcer of other site, unspecified stage: Secondary | ICD-10-CM | POA: Diagnosis present

## 2015-04-03 DIAGNOSIS — C7919 Secondary malignant neoplasm of other urinary organs: Secondary | ICD-10-CM | POA: Diagnosis present

## 2015-04-03 DIAGNOSIS — R410 Disorientation, unspecified: Secondary | ICD-10-CM | POA: Diagnosis not present

## 2015-04-03 DIAGNOSIS — E872 Acidosis: Secondary | ICD-10-CM | POA: Diagnosis not present

## 2015-04-03 DIAGNOSIS — G40909 Epilepsy, unspecified, not intractable, without status epilepticus: Secondary | ICD-10-CM | POA: Diagnosis present

## 2015-04-03 DIAGNOSIS — A498 Other bacterial infections of unspecified site: Secondary | ICD-10-CM | POA: Insufficient documentation

## 2015-04-03 DIAGNOSIS — Z933 Colostomy status: Secondary | ICD-10-CM | POA: Diagnosis not present

## 2015-04-03 DIAGNOSIS — I739 Peripheral vascular disease, unspecified: Secondary | ICD-10-CM | POA: Diagnosis present

## 2015-04-03 DIAGNOSIS — N178 Other acute kidney failure: Secondary | ICD-10-CM | POA: Diagnosis present

## 2015-04-03 DIAGNOSIS — F319 Bipolar disorder, unspecified: Secondary | ICD-10-CM | POA: Diagnosis present

## 2015-04-03 DIAGNOSIS — C688 Malignant neoplasm of overlapping sites of urinary organs: Secondary | ICD-10-CM | POA: Diagnosis not present

## 2015-04-03 DIAGNOSIS — R278 Other lack of coordination: Secondary | ICD-10-CM | POA: Diagnosis present

## 2015-04-03 DIAGNOSIS — N183 Chronic kidney disease, stage 3 (moderate): Secondary | ICD-10-CM | POA: Diagnosis present

## 2015-04-03 DIAGNOSIS — N390 Urinary tract infection, site not specified: Secondary | ICD-10-CM | POA: Diagnosis present

## 2015-04-03 DIAGNOSIS — G9341 Metabolic encephalopathy: Secondary | ICD-10-CM | POA: Diagnosis present

## 2015-04-03 DIAGNOSIS — T426X5A Adverse effect of other antiepileptic and sedative-hypnotic drugs, initial encounter: Secondary | ICD-10-CM | POA: Diagnosis present

## 2015-04-03 DIAGNOSIS — R509 Fever, unspecified: Secondary | ICD-10-CM

## 2015-04-03 DIAGNOSIS — B3749 Other urogenital candidiasis: Secondary | ICD-10-CM | POA: Diagnosis present

## 2015-04-03 DIAGNOSIS — Z87440 Personal history of urinary (tract) infections: Secondary | ICD-10-CM | POA: Diagnosis not present

## 2015-04-03 DIAGNOSIS — E871 Hypo-osmolality and hyponatremia: Secondary | ICD-10-CM | POA: Diagnosis present

## 2015-04-03 DIAGNOSIS — I10 Essential (primary) hypertension: Secondary | ICD-10-CM | POA: Insufficient documentation

## 2015-04-03 DIAGNOSIS — R1310 Dysphagia, unspecified: Secondary | ICD-10-CM | POA: Diagnosis present

## 2015-04-03 DIAGNOSIS — Z7901 Long term (current) use of anticoagulants: Secondary | ICD-10-CM

## 2015-04-03 DIAGNOSIS — Z89612 Acquired absence of left leg above knee: Secondary | ICD-10-CM | POA: Diagnosis not present

## 2015-04-03 DIAGNOSIS — E876 Hypokalemia: Secondary | ICD-10-CM | POA: Diagnosis not present

## 2015-04-03 DIAGNOSIS — Z9221 Personal history of antineoplastic chemotherapy: Secondary | ICD-10-CM

## 2015-04-03 DIAGNOSIS — N319 Neuromuscular dysfunction of bladder, unspecified: Secondary | ICD-10-CM | POA: Diagnosis present

## 2015-04-03 DIAGNOSIS — J9601 Acute respiratory failure with hypoxia: Secondary | ICD-10-CM | POA: Insufficient documentation

## 2015-04-03 DIAGNOSIS — Z86718 Personal history of other venous thrombosis and embolism: Secondary | ICD-10-CM

## 2015-04-03 DIAGNOSIS — Z86711 Personal history of pulmonary embolism: Secondary | ICD-10-CM

## 2015-04-03 DIAGNOSIS — N131 Hydronephrosis with ureteral stricture, not elsewhere classified: Secondary | ICD-10-CM | POA: Diagnosis present

## 2015-04-03 DIAGNOSIS — B961 Klebsiella pneumoniae [K. pneumoniae] as the cause of diseases classified elsewhere: Secondary | ICD-10-CM | POA: Diagnosis present

## 2015-04-03 DIAGNOSIS — T451X5A Adverse effect of antineoplastic and immunosuppressive drugs, initial encounter: Secondary | ICD-10-CM

## 2015-04-03 DIAGNOSIS — N135 Crossing vessel and stricture of ureter without hydronephrosis: Secondary | ICD-10-CM

## 2015-04-03 DIAGNOSIS — D6481 Anemia due to antineoplastic chemotherapy: Secondary | ICD-10-CM | POA: Diagnosis present

## 2015-04-03 DIAGNOSIS — Z1612 Extended spectrum beta lactamase (ESBL) resistance: Secondary | ICD-10-CM

## 2015-04-03 DIAGNOSIS — R710 Precipitous drop in hematocrit: Secondary | ICD-10-CM

## 2015-04-03 DIAGNOSIS — E538 Deficiency of other specified B group vitamins: Secondary | ICD-10-CM | POA: Diagnosis present

## 2015-04-03 HISTORY — DX: Pressure ulcer of sacral region, unspecified stage: L89.159

## 2015-04-03 HISTORY — DX: Sepsis due to methicillin resistant Staphylococcus aureus: A41.02

## 2015-04-03 LAB — CBC WITH DIFFERENTIAL/PLATELET
BASOS ABS: 0 10*3/uL (ref 0.0–0.1)
BASOS PCT: 0 % (ref 0–1)
EOS ABS: 0.3 10*3/uL (ref 0.0–0.7)
Eosinophils Relative: 2 % (ref 0–5)
HCT: 21.7 % — ABNORMAL LOW (ref 39.0–52.0)
HEMOGLOBIN: 7.3 g/dL — AB (ref 13.0–17.0)
Lymphocytes Relative: 5 % — ABNORMAL LOW (ref 12–46)
Lymphs Abs: 0.6 10*3/uL — ABNORMAL LOW (ref 0.7–4.0)
MCH: 27.2 pg (ref 26.0–34.0)
MCHC: 33.6 g/dL (ref 30.0–36.0)
MCV: 81 fL (ref 78.0–100.0)
MONOS PCT: 3 % (ref 3–12)
Monocytes Absolute: 0.4 10*3/uL (ref 0.1–1.0)
Neutro Abs: 10.6 10*3/uL — ABNORMAL HIGH (ref 1.7–7.7)
Neutrophils Relative %: 90 % — ABNORMAL HIGH (ref 43–77)
Platelets: 173 10*3/uL (ref 150–400)
RBC: 2.68 MIL/uL — AB (ref 4.22–5.81)
RDW: 17.9 % — ABNORMAL HIGH (ref 11.5–15.5)
WBC: 11.9 10*3/uL — AB (ref 4.0–10.5)

## 2015-04-03 LAB — COMPREHENSIVE METABOLIC PANEL
ALBUMIN: 2.4 g/dL — AB (ref 3.5–5.0)
ALK PHOS: 97 U/L (ref 38–126)
ALT: 8 U/L — ABNORMAL LOW (ref 17–63)
ANION GAP: 13 (ref 5–15)
AST: 12 U/L — AB (ref 15–41)
BUN: 43 mg/dL — ABNORMAL HIGH (ref 6–20)
CALCIUM: 7.7 mg/dL — AB (ref 8.9–10.3)
CO2: 18 mmol/L — AB (ref 22–32)
Chloride: 95 mmol/L — ABNORMAL LOW (ref 101–111)
Creatinine, Ser: 7.18 mg/dL — ABNORMAL HIGH (ref 0.61–1.24)
GFR calc Af Amer: 9 mL/min — ABNORMAL LOW (ref >60)
GFR calc non Af Amer: 8 mL/min — ABNORMAL LOW (ref >60)
GLUCOSE: 105 mg/dL — AB (ref 65–99)
POTASSIUM: 4.3 mmol/L (ref 3.5–5.1)
SODIUM: 126 mmol/L — AB (ref 135–145)
Total Bilirubin: 0.1 mg/dL — ABNORMAL LOW (ref 0.3–1.2)
Total Protein: 7.1 g/dL (ref 6.5–8.1)

## 2015-04-03 MED ORDER — SODIUM CHLORIDE 0.9 % IV BOLUS (SEPSIS)
1000.0000 mL | Freq: Once | INTRAVENOUS | Status: AC
Start: 2015-04-03 — End: 2015-04-04
  Administered 2015-04-04: 1000 mL via INTRAVENOUS
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
## 2015-04-03 NOTE — ED Notes (Signed)
Unable to send urine sample. Patient not making urine.

## 2015-04-03 NOTE — ED Notes (Signed)
Pt here from [facility] because the pt :felt like he was going to have a seizure." Pt was here last week and dx with UTI. Pt has not taken oral abx because they make him vomit. Pt reports that he vomited his lunch today as well. Pt is a/o x 3. Pt is twitching at this time, sts that is normal.

## 2015-04-04 ENCOUNTER — Inpatient Hospital Stay (HOSPITAL_COMMUNITY): Payer: Medicare Other

## 2015-04-04 ENCOUNTER — Encounter (HOSPITAL_COMMUNITY): Payer: Self-pay | Admitting: Internal Medicine

## 2015-04-04 DIAGNOSIS — E871 Hypo-osmolality and hyponatremia: Secondary | ICD-10-CM

## 2015-04-04 DIAGNOSIS — D649 Anemia, unspecified: Secondary | ICD-10-CM

## 2015-04-04 DIAGNOSIS — I10 Essential (primary) hypertension: Secondary | ICD-10-CM | POA: Insufficient documentation

## 2015-04-04 DIAGNOSIS — R569 Unspecified convulsions: Secondary | ICD-10-CM

## 2015-04-04 DIAGNOSIS — N179 Acute kidney failure, unspecified: Secondary | ICD-10-CM | POA: Insufficient documentation

## 2015-04-04 DIAGNOSIS — Z7901 Long term (current) use of anticoagulants: Secondary | ICD-10-CM | POA: Insufficient documentation

## 2015-04-04 LAB — TSH: TSH: 1.354 u[IU]/mL (ref 0.350–4.500)

## 2015-04-04 LAB — CBC
HCT: 22.2 % — ABNORMAL LOW (ref 39.0–52.0)
Hemoglobin: 7.2 g/dL — ABNORMAL LOW (ref 13.0–17.0)
MCH: 26.4 pg (ref 26.0–34.0)
MCHC: 32.4 g/dL (ref 30.0–36.0)
MCV: 81.3 fL (ref 78.0–100.0)
PLATELETS: 156 10*3/uL (ref 150–400)
RBC: 2.73 MIL/uL — AB (ref 4.22–5.81)
RDW: 17.8 % — ABNORMAL HIGH (ref 11.5–15.5)
WBC: 11.6 10*3/uL — ABNORMAL HIGH (ref 4.0–10.5)

## 2015-04-04 LAB — COMPREHENSIVE METABOLIC PANEL
ALK PHOS: 92 U/L (ref 38–126)
ALT: 8 U/L — AB (ref 17–63)
ANION GAP: 12 (ref 5–15)
AST: 12 U/L — ABNORMAL LOW (ref 15–41)
Albumin: 2.3 g/dL — ABNORMAL LOW (ref 3.5–5.0)
BILIRUBIN TOTAL: 0.5 mg/dL (ref 0.3–1.2)
BUN: 49 mg/dL — ABNORMAL HIGH (ref 6–20)
CALCIUM: 7.5 mg/dL — AB (ref 8.9–10.3)
CO2: 17 mmol/L — AB (ref 22–32)
CREATININE: 7.34 mg/dL — AB (ref 0.61–1.24)
Chloride: 98 mmol/L — ABNORMAL LOW (ref 101–111)
GFR calc non Af Amer: 7 mL/min — ABNORMAL LOW (ref >60)
GFR, EST AFRICAN AMERICAN: 8 mL/min — AB (ref >60)
GLUCOSE: 96 mg/dL (ref 65–99)
Potassium: 3.8 mmol/L (ref 3.5–5.1)
SODIUM: 127 mmol/L — AB (ref 135–145)
TOTAL PROTEIN: 6.8 g/dL (ref 6.5–8.1)

## 2015-04-04 LAB — SODIUM, URINE, RANDOM: Sodium, Ur: 120 mmol/L

## 2015-04-04 LAB — OSMOLALITY: OSMOLALITY: 286 mosm/kg (ref 275–300)

## 2015-04-04 LAB — PROTIME-INR
INR: 2.53 — AB (ref 0.00–1.49)
PROTHROMBIN TIME: 26.9 s — AB (ref 11.6–15.2)

## 2015-04-04 LAB — IRON AND TIBC
Iron: 55 ug/dL (ref 45–182)
Saturation Ratios: 43 % — ABNORMAL HIGH (ref 17.9–39.5)
TIBC: 129 ug/dL — ABNORMAL LOW (ref 250–450)
UIBC: 74 ug/dL

## 2015-04-04 LAB — CORTISOL: Cortisol, Plasma: 13.2 ug/dL

## 2015-04-04 LAB — OSMOLALITY, URINE: Osmolality, Ur: 288 mOsm/kg — ABNORMAL LOW (ref 390–1090)

## 2015-04-04 LAB — VITAMIN B12: Vitamin B-12: 1304 pg/mL — ABNORMAL HIGH (ref 180–914)

## 2015-04-04 LAB — FERRITIN: Ferritin: 294 ng/mL (ref 24–336)

## 2015-04-04 MED ORDER — LAMOTRIGINE 150 MG PO TABS
300.0000 mg | ORAL_TABLET | Freq: Two times a day (BID) | ORAL | Status: DC
  Administered 2015-04-04 (×2): 300 mg via ORAL
  Filled 2015-04-04 (×5): qty 2

## 2015-04-04 MED ORDER — POLYVINYL ALCOHOL 1.4 % OP SOLN
2.0000 [drp] | Freq: Three times a day (TID) | OPHTHALMIC | Status: DC
  Administered 2015-04-04 – 2015-04-21 (×35): 2 [drp] via OPHTHALMIC
  Filled 2015-04-04 (×2): qty 15

## 2015-04-04 MED ORDER — NITROGLYCERIN 0.4 MG SL SUBL: 0.4000 mg | SUBLINGUAL_TABLET | SUBLINGUAL | Status: DC | PRN

## 2015-04-04 MED ORDER — SODIUM CHLORIDE 0.9 % IJ SOLN
3.0000 mL | Freq: Two times a day (BID) | INTRAMUSCULAR | Status: DC
  Administered 2015-04-06 – 2015-04-20 (×15): 3 mL via INTRAVENOUS

## 2015-04-04 MED ORDER — DOCUSATE SODIUM 100 MG PO CAPS
100.0000 mg | ORAL_CAPSULE | Freq: Two times a day (BID) | ORAL | Status: DC
  Administered 2015-04-04 (×3): 100 mg via ORAL
  Filled 2015-04-04 (×5): qty 1

## 2015-04-04 MED ORDER — CHOLECALCIFEROL 1.25 MG (50000 UT) PO CAPS: 50000.0000 [IU] | ORAL_CAPSULE | ORAL | Status: DC

## 2015-04-04 MED ORDER — SODIUM CHLORIDE 0.9 % IV SOLN
INTRAVENOUS | Status: AC
  Administered 2015-04-04: 02:00:00 via INTRAVENOUS

## 2015-04-04 MED ORDER — SODIUM CHLORIDE 0.9 % IJ SOLN
10.0000 mL | INTRAMUSCULAR | Status: DC | PRN
  Administered 2015-04-05 – 2015-04-07 (×4): 10 mL
  Administered 2015-04-08: 20 mL
  Administered 2015-04-13 – 2015-04-21 (×5): 10 mL
  Filled 2015-04-04 (×10): qty 40

## 2015-04-04 MED ORDER — PREGABALIN 25 MG PO CAPS
25.0000 mg | ORAL_CAPSULE | Freq: Three times a day (TID) | ORAL | Status: DC
Start: 2015-04-04 — End: 2015-04-05
  Administered 2015-04-04 (×3): 25 mg via ORAL
  Filled 2015-04-04 (×3): qty 1

## 2015-04-04 MED ORDER — FAMOTIDINE 20 MG PO TABS
20.0000 mg | ORAL_TABLET | Freq: Every day | ORAL | Status: DC
  Administered 2015-04-04 (×2): 20 mg via ORAL
  Filled 2015-04-04 (×2): qty 1

## 2015-04-04 MED ORDER — LINEZOLID 600 MG PO TABS
600.0000 mg | ORAL_TABLET | Freq: Two times a day (BID) | ORAL | Status: DC
  Administered 2015-04-04 (×2): 600 mg via ORAL
  Filled 2015-04-04 (×9): qty 1

## 2015-04-04 MED ORDER — BACLOFEN 10 MG PO TABS
5.0000 mg | ORAL_TABLET | Freq: Three times a day (TID) | ORAL | Status: DC
  Administered 2015-04-04 – 2015-04-21 (×27): 5 mg via ORAL
  Filled 2015-04-04 (×38): qty 1

## 2015-04-04 MED ORDER — CLONAZEPAM 0.5 MG PO TABS
0.5000 mg | ORAL_TABLET | Freq: Two times a day (BID) | ORAL | Status: DC
  Administered 2015-04-04: 0.5 mg via ORAL
  Filled 2015-04-04 (×2): qty 1

## 2015-04-04 MED ORDER — TRAZODONE HCL 50 MG PO TABS
50.0000 mg | ORAL_TABLET | Freq: Every day | ORAL | Status: DC
  Administered 2015-04-04: 50 mg via ORAL
  Filled 2015-04-04: qty 1

## 2015-04-04 MED ORDER — AMLODIPINE BESYLATE 5 MG PO TABS
5.0000 mg | ORAL_TABLET | Freq: Every day | ORAL | Status: DC
  Administered 2015-04-04: 5 mg via ORAL
  Filled 2015-04-04 (×2): qty 1

## 2015-04-04 MED ORDER — SENNA 8.6 MG PO TABS
2.0000 | ORAL_TABLET | Freq: Every day | ORAL | Status: DC
  Administered 2015-04-04: 17.2 mg via ORAL
  Filled 2015-04-04 (×2): qty 2

## 2015-04-04 MED ORDER — FERROUS SULFATE 325 (65 FE) MG PO TBEC: 325.0000 mg | DELAYED_RELEASE_TABLET | Freq: Two times a day (BID) | ORAL | Status: DC

## 2015-04-04 MED ORDER — GUAIFENESIN 100 MG/5ML PO SOLN: 15.0000 mL | Freq: Three times a day (TID) | ORAL | Status: DC | PRN

## 2015-04-04 MED ORDER — POLYETHYL GLYCOL-PROPYL GLYCOL 0.4-0.3 % OP SOLN: 2.0000 [drp] | Freq: Three times a day (TID) | OPHTHALMIC | Status: DC

## 2015-04-04 MED ORDER — ACETAMINOPHEN 325 MG PO TABS
650.0000 mg | ORAL_TABLET | Freq: Four times a day (QID) | ORAL | Status: DC | PRN
Start: 2015-04-04 — End: 2015-04-21
  Filled 2015-04-04: qty 2

## 2015-04-04 MED ORDER — QUETIAPINE FUMARATE 50 MG PO TABS
300.0000 mg | ORAL_TABLET | Freq: Every day | ORAL | Status: DC
  Administered 2015-04-04 (×2): 300 mg via ORAL
  Filled 2015-04-04 (×2): qty 6

## 2015-04-04 MED ORDER — FOLIC ACID 1 MG PO TABS
1.0000 mg | ORAL_TABLET | Freq: Every day | ORAL | Status: DC
  Administered 2015-04-04: 1 mg via ORAL
  Filled 2015-04-04 (×2): qty 1

## 2015-04-04 MED ORDER — BOOST PO LIQD
237.0000 mL | Freq: Two times a day (BID) | ORAL | Status: DC
  Filled 2015-04-04 (×12): qty 237

## 2015-04-04 MED ORDER — WARFARIN SODIUM 2.5 MG PO TABS
2.5000 mg | ORAL_TABLET | Freq: Every day | ORAL | Status: DC
Start: 2015-04-04 — End: 2015-04-04

## 2015-04-04 MED ORDER — FERROUS SULFATE 325 (65 FE) MG PO TABS
325.0000 mg | ORAL_TABLET | Freq: Two times a day (BID) | ORAL | Status: DC
  Administered 2015-04-04 (×2): 325 mg via ORAL
  Filled 2015-04-04 (×4): qty 1

## 2015-04-04 MED ORDER — VITAMIN B-12 100 MCG PO TABS
100.0000 ug | ORAL_TABLET | Freq: Every day | ORAL | Status: DC
  Administered 2015-04-04: 100 ug via ORAL
  Filled 2015-04-04 (×2): qty 1

## 2015-04-04 MED ORDER — ATORVASTATIN CALCIUM 10 MG PO TABS
10.0000 mg | ORAL_TABLET | Freq: Every day | ORAL | Status: DC
  Administered 2015-04-04: 10 mg via ORAL
  Filled 2015-04-04: qty 1

## 2015-04-04 MED ORDER — OXYBUTYNIN CHLORIDE ER 5 MG PO TB24
5.0000 mg | ORAL_TABLET | Freq: Every morning | ORAL | Status: DC
  Administered 2015-04-04: 5 mg via ORAL
  Filled 2015-04-04 (×5): qty 1

## 2015-04-04 MED ORDER — ACETAMINOPHEN 650 MG RE SUPP
650.0000 mg | Freq: Four times a day (QID) | RECTAL | Status: DC | PRN
Start: 2015-04-04 — End: 2015-04-21
  Filled 2015-04-04: qty 1

## 2015-04-04 MED ORDER — WARFARIN - PHARMACIST DOSING INPATIENT: Freq: Every day | Status: DC

## 2015-04-04 MED ORDER — SILVER SULFADIAZINE 1 % EX CREA
TOPICAL_CREAM | Freq: Two times a day (BID) | CUTANEOUS | Status: DC
  Administered 2015-04-05: 22:00:00 via TOPICAL

## 2015-04-04 MED ORDER — SENNOSIDES 8.6 MG PO TABS: 2.0000 | ORAL_TABLET | Freq: Every morning | ORAL | Status: DC

## 2015-04-04 MED ORDER — LACTULOSE 10 GM/15ML PO SOLN
20.0000 g | Freq: Four times a day (QID) | ORAL | Status: DC
  Administered 2015-04-04: 20 g via ORAL
  Administered 2015-04-04: 10 g via ORAL
  Filled 2015-04-04 (×2): qty 30

## 2015-04-04 MED ORDER — METOPROLOL TARTRATE 25 MG PO TABS
25.0000 mg | ORAL_TABLET | Freq: Two times a day (BID) | ORAL | Status: DC
  Administered 2015-04-04 – 2015-04-21 (×21): 25 mg via ORAL
  Filled 2015-04-04 (×6): qty 1
  Filled 2015-04-04: qty 2
  Filled 2015-04-04 (×16): qty 1

## 2015-04-04 MED ORDER — PANTOPRAZOLE SODIUM 40 MG PO TBEC
40.0000 mg | DELAYED_RELEASE_TABLET | Freq: Every day | ORAL | Status: DC
  Administered 2015-04-04 – 2015-04-21 (×10): 40 mg via ORAL
  Filled 2015-04-04 (×13): qty 1
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
## 2015-04-04 NOTE — Procedures (Signed)
History: 59 year old male with a history of seizures presenting with altered mental status and renal failure.  Sedation: None  Technique: This is a 19 channel routine scalp EEG performed at the bedside with bipolar and monopolar montages arranged in accordance to the international 10/20 system of electrode placement. One channel was dedicated to EKG recording.    Background: The background consists of irregular delta and theta activities. In addition, there is a poorly formed posterior dominant rhythm which achieves a maximal frequency of 7 Hz. There are sharp waves that are maximal at C4, P4 > O2. There are also occasional bifrontally predominant triphasic morphology waves.  Photic stimulation: Physiologic driving is not performed  EEG Abnormalities: 1) right posterior quadrant sharp waves 2) triphasic waves 3) generalized slow activity with a slow PDR  Clinical Interpretation: This EEG is consistent with an area of potential epileptogenicity in the right posterior quadrant in the setting of a generalized non-specific cerebral dysfunction(encephalopathy). There was no seizure recorded on this study.   Roland Rack, MD [facility] [phone]  If 7pm- 7am, please page neurology on call as listed in [place].
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
## 2015-04-04 NOTE — Progress Notes (Signed)
EEG Completed; Results Pending  

## 2015-04-04 NOTE — Progress Notes (Signed)
Utilization review completed. Orrie Lascano, RN, BSN. 

## 2015-04-04 NOTE — Progress Notes (Signed)
[facility] for Coumadin Indication: h/p PE  Allergies  Allergen Reactions  . Tomato Other (See Comments)    Causes acid reflux    Patient Measurements: Height: 5\' 4"  (162.6 cm) Weight: 223 lb 1.7 oz (101.2 kg) IBW/kg (Calculated) : 59.2  Vital Signs: Temp: 97.6 F (36.4 C) (09/07 0500) Temp Source: Oral (09/07 0500) BP: 102/66 mmHg (09/07 0500) Pulse Rate: 77 (09/07 0500)  Labs:  Recent Labs  04/03/15 2105 04/04/15 0225  HGB 7.3* 7.2*  HCT 21.7* 22.2*  PLT 173 156  LABPROT  --  26.9*  INR  --  2.53*  CREATININE 7.18* 7.34*    Estimated Creatinine Clearance: 11.9 mL/min (by C-G formula based on Cr of 7.34).  Assessment: 45 YOM admitted with seizures, h/o PE, to continue Coumadin. Home dose Coumadin 2.5 mg daily (last dose 9/6). INR this morning in range at 2.53. Hgb 7.2, plts 156- no bleeding noted.  Goal of Therapy:  INR 2-3 Monitor platelets by anticoagulation protocol: Yes   Plan:  -continue home dose of Coumadin 2.5mg  daily -daily INR for now, if remains stable can reduce to 3x weekly -follow up CB and s/s bleeding  Alila Sotero D. Janalyn Higby, PharmD, BCPS Clinical Pharmacist Pager: [phone] 04/04/2015 11:00 AM
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
## 2015-04-05 DIAGNOSIS — Z7901 Long term (current) use of anticoagulants: Secondary | ICD-10-CM

## 2015-04-05 DIAGNOSIS — I10 Essential (primary) hypertension: Secondary | ICD-10-CM

## 2015-04-05 LAB — BASIC METABOLIC PANEL
Anion gap: 13 (ref 5–15)
BUN: 54 mg/dL — AB (ref 6–20)
CALCIUM: 7.8 mg/dL — AB (ref 8.9–10.3)
CO2: 17 mmol/L — AB (ref 22–32)
CREATININE: 8.66 mg/dL — AB (ref 0.61–1.24)
Chloride: 98 mmol/L — ABNORMAL LOW (ref 101–111)
GFR calc non Af Amer: 6 mL/min — ABNORMAL LOW (ref >60)
GFR, EST AFRICAN AMERICAN: 7 mL/min — AB (ref >60)
Glucose, Bld: 96 mg/dL (ref 65–99)
Potassium: 4.4 mmol/L (ref 3.5–5.1)
SODIUM: 128 mmol/L — AB (ref 135–145)

## 2015-04-05 LAB — UIFE/LIGHT CHAINS/TP QN, 24-HR UR
% BETA, URINE: 29.6 %
ALPHA 1 URINE: 3.5 %
ALPHA 2 UR: 12.4 %
Albumin, U: 42.2 %
FREE KAPPA/LAMBDA RATIO: 9.4 (ref 2.04–10.37)
Free Lambda Lt Chains,Ur: 2.17 mg/L (ref 0.24–6.66)
Free Lt Chn Excr Rate: 20.4 mg/L (ref 1.35–24.19)
GAMMA GLOBULIN URINE: 12.2 %
TOTAL PROTEIN, URINE-UPE24: 14.5 mg/dL

## 2015-04-05 LAB — RENAL FUNCTION PANEL
Albumin: 2.1 g/dL — ABNORMAL LOW (ref 3.5–5.0)
Anion gap: 12 (ref 5–15)
BUN: 54 mg/dL — ABNORMAL HIGH (ref 6–20)
CALCIUM: 7.7 mg/dL — AB (ref 8.9–10.3)
CO2: 17 mmol/L — ABNORMAL LOW (ref 22–32)
CREATININE: 8.52 mg/dL — AB (ref 0.61–1.24)
Chloride: 98 mmol/L — ABNORMAL LOW (ref 101–111)
GFR, EST AFRICAN AMERICAN: 7 mL/min — AB (ref >60)
GFR, EST NON AFRICAN AMERICAN: 6 mL/min — AB (ref >60)
Glucose, Bld: 97 mg/dL (ref 65–99)
Phosphorus: 6.5 mg/dL — ABNORMAL HIGH (ref 2.5–4.6)
Potassium: 4.3 mmol/L (ref 3.5–5.1)
SODIUM: 127 mmol/L — AB (ref 135–145)

## 2015-04-05 LAB — PROTEIN ELECTROPHORESIS, SERUM
A/G Ratio: 0.6 — ABNORMAL LOW (ref 0.7–1.7)
ALPHA-2-GLOBULIN: 0.9 g/dL (ref 0.4–1.0)
Albumin ELP: 2.5 g/dL — ABNORMAL LOW (ref 2.9–4.4)
Alpha-1-Globulin: 0.4 g/dL (ref 0.0–0.4)
BETA GLOBULIN: 1 g/dL (ref 0.7–1.3)
GAMMA GLOBULIN: 1.6 g/dL (ref 0.4–1.8)
Globulin, Total: 3.9 g/dL (ref 2.2–3.9)
Total Protein ELP: 6.4 g/dL (ref 6.0–8.5)

## 2015-04-05 LAB — URINALYSIS, ROUTINE W REFLEX MICROSCOPIC
Bilirubin Urine: NEGATIVE
Glucose, UA: NEGATIVE mg/dL
KETONES UR: NEGATIVE mg/dL
NITRITE: NEGATIVE
Specific Gravity, Urine: 1.022 (ref 1.005–1.030)
Urobilinogen, UA: 0.2 mg/dL (ref 0.0–1.0)
pH: 7.5 (ref 5.0–8.0)

## 2015-04-05 LAB — CBC
HCT: 21.3 % — ABNORMAL LOW (ref 39.0–52.0)
HEMOGLOBIN: 6.9 g/dL — AB (ref 13.0–17.0)
MCH: 26.3 pg (ref 26.0–34.0)
MCHC: 32.4 g/dL (ref 30.0–36.0)
MCV: 81.3 fL (ref 78.0–100.0)
Platelets: 153 10*3/uL (ref 150–400)
RBC: 2.62 MIL/uL — AB (ref 4.22–5.81)
RDW: 18.3 % — ABNORMAL HIGH (ref 11.5–15.5)
WBC: 8.8 10*3/uL (ref 4.0–10.5)

## 2015-04-05 LAB — FOLATE RBC
FOLATE, HEMOLYSATE: 393.7 ng/mL
FOLATE, RBC: 1902 ng/mL (ref >498)
HEMATOCRIT: 20.7 % — AB (ref 37.5–51.0)

## 2015-04-05 LAB — PROTIME-INR
INR: 2.85 — AB (ref 0.00–1.49)
PROTHROMBIN TIME: 29.4 s — AB (ref 11.6–15.2)

## 2015-04-05 LAB — CK: CK TOTAL: 19 U/L — AB (ref 49–397)

## 2015-04-05 LAB — CALCIUM / CREATININE RATIO, URINE
CALCIUM UR: 27.2 mg/dL
CALCIUM/CREAT. RATIO: 261 mg/g{creat} — AB (ref 0–260)
CREATININE, UR: 104.1 mg/dL

## 2015-04-05 LAB — URINE MICROSCOPIC-ADD ON

## 2015-04-05 LAB — PREPARE RBC (CROSSMATCH)

## 2015-04-05 MED ORDER — DEXTROSE 5 % IV SOLN
2.5000 mg | Freq: Once | INTRAVENOUS | Status: AC
  Administered 2015-04-05: 2.5 mg via INTRAVENOUS
  Filled 2015-04-05: qty 0.25

## 2015-04-05 MED ORDER — LORAZEPAM 2 MG/ML IJ SOLN
0.5000 mg | Freq: Once | INTRAMUSCULAR | Status: DC
Start: 2015-04-05 — End: 2015-04-06

## 2015-04-05 MED ORDER — LAMOTRIGINE 100 MG PO TABS
200.0000 mg | ORAL_TABLET | Freq: Two times a day (BID) | ORAL | Status: DC
  Administered 2015-04-11 – 2015-04-20 (×16): 200 mg via ORAL
  Filled 2015-04-05: qty 1
  Filled 2015-04-05: qty 2
  Filled 2015-04-05: qty 1
  Filled 2015-04-05 (×2): qty 2
  Filled 2015-04-05 (×3): qty 1
  Filled 2015-04-05 (×4): qty 2
  Filled 2015-04-05 (×2): qty 1
  Filled 2015-04-05 (×2): qty 2
  Filled 2015-04-05: qty 1
  Filled 2015-04-05 (×5): qty 2
  Filled 2015-04-05 (×3): qty 1
  Filled 2015-04-05 (×2): qty 2
  Filled 2015-04-05 (×2): qty 1
  Filled 2015-04-05: qty 2
  Filled 2015-04-05 (×4): qty 1
  Filled 2015-04-05: qty 2
  Filled 2015-04-05: qty 1

## 2015-04-05 MED ORDER — SODIUM CHLORIDE 0.9 % IV SOLN: Freq: Once | INTRAVENOUS | Status: DC
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
## 2015-04-05 NOTE — Progress Notes (Signed)
Results for CLEDITH, ABDOU (MRN [id number]) as of 04/05/2015 07:23  Ref. Range 04/05/2015 04:55  Hemoglobin Latest Ref Range: 13.0-17.0 g/dL 6.9 (LL)  MD paged awaiting call back.

## 2015-04-05 NOTE — Progress Notes (Addendum)
  Pt remains very drowsy. No specific complaints.   Filed Vitals:   04/05/15 1007  BP: 149/93  Pulse: 88  Temp: 97.7 F (36.5 C)  Resp: 18    Intake/Output Summary (Last 24 hours) at 04/05/15 1521 Last data filed at 04/05/15 1232  Gross per 24 hour  Intake    140 ml  Output      5 ml  Net    135 ml    PE: NAD GU - foley in place - urine clear   A/p - patient with NG bladder, indwelling foley, locally advanced and metastatic bladder cancer. He has had multiple admissions for ARF despite stents and despite upsizing the stents recently. I agree with bilateral perc nephrotomy and I will plan to see patient back in office and remove ureteral stents. Discussed patient with Dr. Maryland Pink.
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
## 2015-04-05 NOTE — Progress Notes (Addendum)
Patient ID: Samuel [name], male   DOB: [birth year], 59 y.o.   MRN: [id number]                HISTORY & PHYSICAL  DATE:  04/03/2015          FACILITY: [facility]                   LEVEL OF CARE:   SNF   CHIEF COMPLAINT:  Re-admission to the facility, post stay at [facility], 03/23/2015 through 03/27/2015.       HISTORY OF PRESENT ILLNESS:  This patient was sent to the emergency room once again with a subacute delirium and acute renal failure.    In the hospital, he was felt to have acute metabolic encephalopathy  related to sepsis and polypharmacy.     Urine culture done from his chronic Foley catheter showed vancomycin resistant Enterococcus and, after consultation with Infectious Disease, he was to receive a two-week course of linezolid.    The patient feels considerably better and anxious to get back to see his oncologist.    With regards to his urothelial cancer, he is status post TURBT in March with ureteral stenting.  Apparently, issues were discussed with Dr. Alen Blew who has also seen the patient.  He is not felt to be currently a candidate for treatment.  A Palliative Care consultation was requested and the patient wanted to be a Full Code.    LABORATORY DATA:  He was admitted to hospital with a creatinine of 3.09, a BUN of 37, and discharged with a BUN of 8 and a creatinine of 1.46 which is more within his chronic range.    His discharge hemoglobin was 7.3, getting down to as low as 7 in the hospital.    Discharge INR was 3.35.    As noted, the patient had a urine culture done that showed vancomycin resistant Enterococcus as well as a second Enterococcus species that was vancomycin sensitive, but quinolone and ampicillin resistant.    PAST MEDICAL HISTORY/PROBLEM LIST:                Recent seizures.    Urothelial carcinoma.    Neurogenic bladder with chronic Foley catheter.    Hypertension.    Hyperlipidemia.    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      Bipolar affective disorder.    Insomnia.    B12 disorder.    Chronic pain.    Constipation.     Anemia, felt to be iron deficiency +/- chronic disease.     Obesity.    Gastroesophageal reflux.    History of polysubstance and alcohol abuse, but not currently.    Adrenal insufficiency.    Pulmonary embolism in 2015.  On chronic anticoagulation.    Traumatic amputation of the left above-knee.    History of hepatitis C, which the patient denies.    CURRENT MEDICATIONS:  Medication list is reviewed.           Norvasc 5 q.d.      Zyvox 600 q.12 for 10 more days.    Baclofen 5 mg three times a day.    Klonopin 0.25 b.i.d.      Ditropan XL 5 mg daily.    Oxycodone 5 mg q.4 h p.r.n.      Desyrel 50 q.d.      Coumadin 4 q.d.      Tylenol 650 q.6 p.r.n.       Maalox 20  mL q.6 p.r.n.     Lipitor 10 q.d.       Vitamin D 50,000 U every 30 days.      Vitamin B12, 1000 daily.    Dexilant 60 q.d.      Colace 100 b.i.d.       Pepcid 20 b.i.d.      Ferrous sulfate 325 b.i.d.      Folic acid 1 mg q.d.      Guaifenesin 15 mg three times daily p.r.n.       Lactulose 20 g four times daily.    Lamictal 100 b.i.d.     Lidoderm patch, 2 patches to affected area.    Lopressor 25 b.i.d.     Nitroglycerin 0.4 q.d.      Zofran 4 mg every 8 hours p.r.n.      Lyrica 25 three times a day.      Phenergan 25 q.8 p.r.n.        Seroquel 300 q.h.s.      Senokot 2 tablets q.a.m.      SOCIAL HISTORY:                   ADVANCED DIRECTIVES:  The patient reiterates his Full Code status.  Wants to see his oncologist as soon as possible.     REVIEW OF SYSTEMS:       GENERAL:  No subjective fever or chills.    HEENT:  No headache.    CHEST/RESPIRATORY:  No cough.  No sputum.    CARDIAC:  No chest pain.    GI:  States his colostomy is moving well and with normal consistency.   GU:  Chronic Foley catheter.   MUSCULOSKELETAL:  Extremities:  He complains of phantom  limb pain, which has been a long-term complaint for him.    NEUROLOGICAL:  CNS:  No focal weakness or numbness.    PSYCHIATRIC:  Mental status:  Denies depression.   SKIN:   I cannot see any open areas on his stump or otherwise.    PHYSICAL EXAMINATION:   VITAL SIGNS:     TEMPERATURE:  98.     PULSE:  84.     RESPIRATIONS:  20.     BLOOD PRESSURE:  132/86.     02 SATURATIONS:  96% on room air.      WEIGHT:  225 pounds on 03/20/2015.     GENERAL APPEARANCE:  The patient is more alert; however, somewhat gaunt and chronically ill-looking.     HEENT:   MOUTH/THROAT:    Oral exam is normal.    Mucous membranes are moist.   CHEST/RESPIRATORY:  Clear air entry bilaterally.     CARDIOVASCULAR:   CARDIAC:  Heart sounds are normal.  He appears to be euvolemic.       GASTROINTESTINAL:   ABDOMEN:  Multiple surgical scars.   Colostomy in the left lower quadrant.   LIVER/SPLEEN/KIDNEY:   No palpable liver or spleen.  No tenderness.   No stigmata of chronic liver disease.     GENITOURINARY:   BLADDER:  Chronic Foley catheter in place.  There is no suprapubic or costovertebral angle tenderness.    SKIN:   INSPECTION:  Extremities:  Right leg has no open wounds.  His peripheral pulses are markedly reduced.    Left AKA site looks fine.     NEUROLOGICAL:   CRANIAL NERVES:  The patient's cranial nerves are normal.    MOTOR:  He is able to move  all his limbs.    PSYCHIATRIC:   MENTAL STATUS:    He responds well and appropriately to my questions.  I still get the impression that he is somewhat less articulate than he used to be.    ASSESSMENT/PLAN:                    Acute renal failure, I think mostly felt to be pre-renal.  That has resolved.  I can see that he has already had follow-up lab work ordered by the Social worker.    Urothelial cancer.  He was seen by Dr. Alen Blew in the hospital, according to the notes, although I do not see a note from him.  This may have been a curbside consultation.    I have my doubts that he will be able to tolerate chemotherapy, but the patient seems determined to go back there.    Delirium.  Some of this may have been medication-induced +/- dehydration.  I note that he seems to have Enterococcus in his urine cultures, I think now through two rounds of Zyvox between this hospitalization and the previous one.  It is difficult to determine if he is truly symptomatic.     Recent history of healthcare-acquired pneumonia during the previous hospitalization.  His respiratory status is stable.    Hydronephrosis.  Stents recently changed in late July.   History of pulmonary embolism.   On chronic anticoagulation.

## 2015-04-05 NOTE — Progress Notes (Signed)
Dr. Maryland Pink paged at 1340 concerning pt yelling out and very agitated. MD ordered Ativan 0.5mg  IV once. Pt. Ultimately calmed down prior to administration; med not needed. MD notified. Precious Gilding

## 2015-04-06 ENCOUNTER — Inpatient Hospital Stay (HOSPITAL_COMMUNITY): Payer: Medicare Other

## 2015-04-06 DIAGNOSIS — N139 Obstructive and reflux uropathy, unspecified: Secondary | ICD-10-CM | POA: Insufficient documentation

## 2015-04-06 DIAGNOSIS — G9340 Encephalopathy, unspecified: Secondary | ICD-10-CM

## 2015-04-06 LAB — CBC
HEMATOCRIT: 29.5 % — AB (ref 39.0–52.0)
HEMOGLOBIN: 10 g/dL — AB (ref 13.0–17.0)
MCH: 27.9 pg (ref 26.0–34.0)
MCHC: 33.9 g/dL (ref 30.0–36.0)
MCV: 82.2 fL (ref 78.0–100.0)
Platelets: 110 10*3/uL — ABNORMAL LOW (ref 150–400)
RBC: 3.59 MIL/uL — AB (ref 4.22–5.81)
RDW: 16.7 % — ABNORMAL HIGH (ref 11.5–15.5)
WBC: 9.8 10*3/uL (ref 4.0–10.5)

## 2015-04-06 LAB — RENAL FUNCTION PANEL
ALBUMIN: 2.3 g/dL — AB (ref 3.5–5.0)
ANION GAP: 13 (ref 5–15)
BUN: 61 mg/dL — ABNORMAL HIGH (ref 6–20)
CALCIUM: 8 mg/dL — AB (ref 8.9–10.3)
CO2: 16 mmol/L — ABNORMAL LOW (ref 22–32)
Chloride: 99 mmol/L — ABNORMAL LOW (ref 101–111)
Creatinine, Ser: 9.76 mg/dL — ABNORMAL HIGH (ref 0.61–1.24)
GFR, EST AFRICAN AMERICAN: 6 mL/min — AB (ref >60)
GFR, EST NON AFRICAN AMERICAN: 5 mL/min — AB (ref >60)
GLUCOSE: 93 mg/dL (ref 65–99)
PHOSPHORUS: 6.3 mg/dL — AB (ref 2.5–4.6)
POTASSIUM: 4.8 mmol/L (ref 3.5–5.1)
SODIUM: 128 mmol/L — AB (ref 135–145)

## 2015-04-06 LAB — PROTIME-INR
INR: 1.76 — ABNORMAL HIGH (ref 0.00–1.49)
INR: 1.48 (ref 0.00–1.49)
PROTHROMBIN TIME: 18 s — AB (ref 11.6–15.2)
Prothrombin Time: 20.5 seconds — ABNORMAL HIGH (ref 11.6–15.2)

## 2015-04-06 LAB — AMMONIA: AMMONIA: 11 umol/L (ref 9–35)

## 2015-04-06 LAB — LAMOTRIGINE LEVEL: Lamotrigine Lvl: 9 ug/mL (ref 2.0–20.0)

## 2015-04-06 MED ORDER — VITAMIN K1 10 MG/ML IJ SOLN
2.5000 mg | INTRAVENOUS | Status: AC
  Administered 2015-04-06: 2.5 mg via INTRAVENOUS
  Filled 2015-04-06: qty 0.25

## 2015-04-06 MED ORDER — CIPROFLOXACIN IN D5W 400 MG/200ML IV SOLN
400.0000 mg | INTRAVENOUS | Status: AC
  Administered 2015-04-07: 400 mg via INTRAVENOUS

## 2015-04-06 MED ORDER — ONDANSETRON HCL 4 MG/2ML IJ SOLN
4.0000 mg | Freq: Four times a day (QID) | INTRAMUSCULAR | Status: DC | PRN
  Administered 2015-04-12 – 2015-04-21 (×12): 4 mg via INTRAVENOUS
  Filled 2015-04-06 (×13): qty 2

## 2015-04-06 MED ORDER — FENTANYL CITRATE (PF) 100 MCG/2ML IJ SOLN
INTRAMUSCULAR | Status: AC
  Filled 2015-04-06: qty 6

## 2015-04-06 MED ORDER — LACTULOSE ENEMA
300.0000 mL | Freq: Once | ORAL | Status: DC
  Filled 2015-04-06: qty 300

## 2015-04-06 MED ORDER — LORAZEPAM 2 MG/ML IJ SOLN
0.5000 mg | Freq: Once | INTRAMUSCULAR | Status: AC
  Administered 2015-04-06: 0.5 mg via INTRAVENOUS
  Filled 2015-04-06: qty 1

## 2015-04-06 MED ORDER — LIDOCAINE HCL (PF) 1 % IJ SOLN
INTRAMUSCULAR | Status: AC
  Filled 2015-04-06: qty 30

## 2015-04-06 MED ORDER — MIDAZOLAM HCL 2 MG/2ML IJ SOLN
INTRAMUSCULAR | Status: AC
  Filled 2015-04-06: qty 6

## 2015-04-06 MED ORDER — HYDRALAZINE HCL 20 MG/ML IJ SOLN
10.0000 mg | Freq: Once | INTRAMUSCULAR | Status: AC
  Administered 2015-04-06: 10 mg via INTRAVENOUS
  Filled 2015-04-06: qty 1

## 2015-04-06 NOTE — Progress Notes (Addendum)
   Pt see in IR suite. Back in his bed. Not really following commands. In NAD.   U/s showed decompressed bladder and worsening hydro. UOP has been poor.   A/P - locally advanced and metastatic bladder cancer - bilateral percutaneous nephrostomy pending. IR acquiring anesthesia assistance.  Will follow. I discussed patient with Dr. Earleen Newport.

## 2015-04-06 NOTE — Progress Notes (Signed)
Subjective:  Stable overnight- still no UOP- urology saw- failed stent management are making recommendation for perc tubes- plan is for those  today  Objective Vital signs in last 24 hours: Filed Vitals:   04/06/15 0230 04/06/15 0425 04/06/15 0445 04/06/15 0600  BP: 170/99 161/104 194/102 177/99  Pulse: 98 117 94 115  Temp: 97.8 F (36.6 C) 97.8 F (36.6 C) 97.9 F (36.6 C) 98.4 F (36.9 C)  TempSrc: Axillary Axillary Axillary Axillary  Resp: 18 18 18 18   Height:      Weight:   105.5 kg (232 lb 9.4 oz)   SpO2: 98% 99% 99% 98%   Weight change: 0.3 kg (10.6 oz)  Intake/Output Summary (Last 24 hours) at 04/06/15 1200 Last data filed at 04/06/15 0609  Gross per 24 hour  Intake    608 ml  Output      0 ml  Net    608 ml    Assessment/Plan: 59 year old male with multiple medical issues presenting with acute on chronic renal failure developed over the last 7 days.  1.Renal- acute on chronic renal failure that developed over the last week.  ultrasound shows moderate hydronephrosis and is worse from ultrasound obtained 2 weeks ago. Urology has seen- planning for perc tubes today.   Unfortunately I dont think  patient is a candidate for dialysis therapy of any form given his comorbidities and his baseline quality of life 2. Hypertension/volume - blood pressures high on current regimen- is overloaded - once gets unobstructed will help 3. Seizure - patient is a baseline seizure disorder. His metabolic abnormalities might have kicked off this most recent seizure. Management per primary team. CK was low 4. Anemia - multifactorial and severe. Workup per primary team- transfusion 9/8 5. Hyponatremia suspect related to hypervolemia since his obstructed and having very little urine output. Should resolve once obstruction is relieved.  6. Urothelial CA- has portacath but apparently now determined to not be candidate for therapy    Shailynn Fong A    Labs: Basic Metabolic  Panel:  Recent Labs Lab 04/05/15 0453 04/05/15 0455 04/06/15 0449  NA 127* 128* 128*  K 4.3 4.4 4.8  CL 98* 98* 99*  CO2 17* 17* 16*  GLUCOSE 97 96 93  BUN 54* 54* 61*  CREATININE 8.52* 8.66* 9.76*  CALCIUM 7.7* 7.8* 8.0*  PHOS 6.5*  --  6.3*   Liver Function Tests:  Recent Labs Lab 04/03/15 2105 04/04/15 0225 04/05/15 0453 04/06/15 0449  AST 12* 12*  --   --   ALT 8* 8*  --   --   ALKPHOS 97 92  --   --   BILITOT 0.1* 0.5  --   --   PROT 7.1 6.8  --   --   ALBUMIN 2.4* 2.3* 2.1* 2.3*   No results for input(s): LIPASE, AMYLASE in the last 168 hours. No results for input(s): AMMONIA in the last 168 hours. CBC:  Recent Labs Lab 04/03/15 2105 04/04/15 0225 04/04/15 0300 04/05/15 0455 04/06/15 0449  WBC 11.9* 11.6*  --  8.8 9.8  NEUTROABS 10.6*  --   --   --   --   HGB 7.3* 7.2*  --  6.9* 10.0*  HCT 21.7* 22.2* 20.7* 21.3* 29.5*  MCV 81.0 81.3  --  81.3 82.2  PLT 173 156  --  153 110*   Cardiac Enzymes:  Recent Labs Lab 04/05/15 0455  CKTOTAL 19*   CBG: No results for input(s): GLUCAP in the last  168 hours.  Iron Studies:   Recent Labs  04/04/15 0300  IRON 55  TIBC 129*  FERRITIN 294   Studies/Results: No results found. Medications: Infusions:    Scheduled Medications: . sodium chloride   Intravenous Once  . amLODipine  5 mg Oral Daily  . atorvastatin  10 mg Oral q1800  . baclofen  5 mg Oral TID  . [START ON 05/02/2015] Cholecalciferol  50,000 Units Oral Q30 days  . docusate sodium  100 mg Oral BID  . famotidine  20 mg Oral QHS  . ferrous sulfate  325 mg Oral BID WC  . folic acid  1 mg Oral Daily  . lactose free nutrition  237 mL Oral BID BM  . lactulose  300 mL Rectal Once  . lamoTRIgine  200 mg Oral BID  . linezolid  600 mg Oral Q12H  . metoprolol tartrate  25 mg Oral BID  . oxybutynin  5 mg Oral q morning - 10a  . pantoprazole  40 mg Oral Daily  . polyvinyl alcohol  2 drop Both Eyes TID  . senna  2 tablet Oral Daily  . silver  sulfADIAZINE   Topical BID  . sodium chloride  3 mL Intravenous Q12H  . cyanocobalamin  100 mcg Oral Daily    have reviewed scheduled and prn medications.  Physical Exam: General: very sleepy and difficult to arouse Heart: RRR Lungs: poor effort, dec at bases Abdomen: obese, mult scars- ostomy Extremities: high amp on left- right with edema    04/06/2015,12:00 PM  LOS: 3 days

## 2015-04-06 NOTE — Clinical Social Work Note (Signed)
Patient from [place] and will return there at discharge. CSW will follow-up with patient to confirm discharge plans.  Lynae Pederson Givens, MSW, LCSW Licensed Clinical Social Worker [place] [phone]
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
## 2015-04-06 NOTE — Progress Notes (Signed)
Pt has arrived to unit after being restless and unable to follow commands in IR. Only responds to voice. Currently lying in bed with eyes closed. VS taken. MD called to report pt's current status. Ordered to continue to monitor pt. Will continue to assess and monitor pt.   Carole Civil, RN
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
## 2015-04-07 ENCOUNTER — Inpatient Hospital Stay (HOSPITAL_COMMUNITY): Payer: Medicare Other | Admitting: Anesthesiology

## 2015-04-07 ENCOUNTER — Encounter (HOSPITAL_COMMUNITY): Payer: Self-pay | Admitting: Certified Registered"

## 2015-04-07 ENCOUNTER — Inpatient Hospital Stay (HOSPITAL_COMMUNITY): Payer: Medicare Other

## 2015-04-07 ENCOUNTER — Encounter (HOSPITAL_COMMUNITY)
Admission: EM | Disposition: A | Discharge status: Skilled Nursing Facility | Payer: Self-pay | Source: Home / Self Care | Attending: Internal Medicine

## 2015-04-07 DIAGNOSIS — G40909 Epilepsy, unspecified, not intractable, without status epilepticus: Secondary | ICD-10-CM

## 2015-04-07 HISTORY — PX: RADIOLOGY WITH ANESTHESIA: SHX6223

## 2015-04-07 LAB — TYPE AND SCREEN
ABO/RH(D): O POS
Antibody Screen: NEGATIVE
UNIT DIVISION: 0
Unit division: 0

## 2015-04-07 LAB — GRAM STAIN

## 2015-04-07 LAB — BASIC METABOLIC PANEL
Anion gap: 15 (ref 5–15)
Anion gap: 13 (ref 5–15)
BUN: 72 mg/dL — AB (ref 6–20)
BUN: 72 mg/dL — AB (ref 6–20)
CALCIUM: 8.1 mg/dL — AB (ref 8.9–10.3)
CALCIUM: 8.1 mg/dL — AB (ref 8.9–10.3)
CO2: 13 mmol/L — AB (ref 22–32)
CO2: 16 mmol/L — AB (ref 22–32)
Chloride: 101 mmol/L (ref 101–111)
Chloride: 101 mmol/L (ref 101–111)
Creatinine, Ser: 11.19 mg/dL — ABNORMAL HIGH (ref 0.61–1.24)
Creatinine, Ser: 11.12 mg/dL — ABNORMAL HIGH (ref 0.61–1.24)
GFR calc Af Amer: 5 mL/min — ABNORMAL LOW (ref >60)
GFR calc Af Amer: 5 mL/min — ABNORMAL LOW (ref >60)
GFR, EST NON AFRICAN AMERICAN: 4 mL/min — AB (ref >60)
GFR, EST NON AFRICAN AMERICAN: 4 mL/min — AB (ref >60)
GLUCOSE: 87 mg/dL (ref 65–99)
GLUCOSE: 101 mg/dL — AB (ref 65–99)
POTASSIUM: 4.9 mmol/L (ref 3.5–5.1)
Potassium: 5.3 mmol/L — ABNORMAL HIGH (ref 3.5–5.1)
Sodium: 129 mmol/L — ABNORMAL LOW (ref 135–145)
Sodium: 130 mmol/L — ABNORMAL LOW (ref 135–145)

## 2015-04-07 LAB — CBC
HCT: 27.4 % — ABNORMAL LOW (ref 39.0–52.0)
Hemoglobin: 9.3 g/dL — ABNORMAL LOW (ref 13.0–17.0)
MCH: 27.8 pg (ref 26.0–34.0)
MCHC: 33.9 g/dL (ref 30.0–36.0)
MCV: 82 fL (ref 78.0–100.0)
PLATELETS: 112 10*3/uL — AB (ref 150–400)
RBC: 3.34 MIL/uL — ABNORMAL LOW (ref 4.22–5.81)
RDW: 17.2 % — AB (ref 11.5–15.5)
WBC: 21.1 10*3/uL — AB (ref 4.0–10.5)

## 2015-04-07 LAB — PROTIME-INR
INR: 1.41 (ref 0.00–1.49)
Prothrombin Time: 17.3 seconds — ABNORMAL HIGH (ref 11.6–15.2)

## 2015-04-07 LAB — AMMONIA: AMMONIA: 21 umol/L (ref 9–35)

## 2015-04-07 SURGERY — RADIOLOGY WITH ANESTHESIA
Anesthesia: General | Laterality: Bilateral

## 2015-04-07 MED ORDER — SUCCINYLCHOLINE CHLORIDE 20 MG/ML IJ SOLN
INTRAMUSCULAR | Status: DC | PRN
  Administered 2015-04-07: 140 mg via INTRAVENOUS

## 2015-04-07 MED ORDER — ONDANSETRON HCL 4 MG/2ML IJ SOLN
INTRAMUSCULAR | Status: DC | PRN
  Administered 2015-04-07: 4 mg via INTRAVENOUS

## 2015-04-07 MED ORDER — SODIUM CHLORIDE 0.9 % IV SOLN
INTRAVENOUS | Status: DC | PRN
  Administered 2015-04-07: 09:00:00 via INTRAVENOUS

## 2015-04-07 MED ORDER — HYDRALAZINE HCL 20 MG/ML IJ SOLN
10.0000 mg | Freq: Four times a day (QID) | INTRAMUSCULAR | Status: DC | PRN
  Administered 2015-04-07: 10 mg via INTRAVENOUS
  Filled 2015-04-07: qty 1

## 2015-04-07 MED ORDER — MEPERIDINE HCL 25 MG/ML IJ SOLN: 6.2500 mg | INTRAMUSCULAR | Status: DC | PRN

## 2015-04-07 MED ORDER — NEOSTIGMINE METHYLSULFATE 10 MG/10ML IV SOLN
INTRAVENOUS | Status: DC | PRN
  Administered 2015-04-07: 5 mg via INTRAVENOUS

## 2015-04-07 MED ORDER — PHENYLEPHRINE HCL 10 MG/ML IJ SOLN
10.0000 mg | INTRAVENOUS | Status: DC | PRN
  Administered 2015-04-07: 100 ug/min via INTRAVENOUS

## 2015-04-07 MED ORDER — FENTANYL CITRATE (PF) 100 MCG/2ML IJ SOLN: 25.0000 ug | INTRAMUSCULAR | Status: DC | PRN

## 2015-04-07 MED ORDER — GLYCOPYRROLATE 0.2 MG/ML IJ SOLN
INTRAMUSCULAR | Status: DC | PRN
  Administered 2015-04-07: .8 mg via INTRAVENOUS

## 2015-04-07 MED ORDER — ROCURONIUM BROMIDE 100 MG/10ML IV SOLN
INTRAVENOUS | Status: DC | PRN
  Administered 2015-04-07: 30 mg via INTRAVENOUS

## 2015-04-07 MED ORDER — PHENYLEPHRINE HCL 10 MG/ML IJ SOLN
INTRAMUSCULAR | Status: DC | PRN
  Administered 2015-04-07: 120 ug via INTRAVENOUS
  Administered 2015-04-07: 80 ug via INTRAVENOUS
  Administered 2015-04-07: 200 ug via INTRAVENOUS

## 2015-04-07 MED ORDER — EPHEDRINE SULFATE 50 MG/ML IJ SOLN
INTRAMUSCULAR | Status: DC | PRN
  Administered 2015-04-07: 10 mg via INTRAVENOUS

## 2015-04-07 MED ORDER — FENTANYL CITRATE (PF) 100 MCG/2ML IJ SOLN
INTRAMUSCULAR | Status: DC | PRN
  Administered 2015-04-07: 100 ug via INTRAVENOUS

## 2015-04-07 MED ORDER — CIPROFLOXACIN IN D5W 400 MG/200ML IV SOLN
INTRAVENOUS | Status: AC
  Filled 2015-04-07: qty 200

## 2015-04-07 MED ORDER — PROPOFOL 10 MG/ML IV BOLUS
INTRAVENOUS | Status: DC | PRN
  Administered 2015-04-07: 200 mg via INTRAVENOUS

## 2015-04-07 MED ORDER — IOHEXOL 300 MG/ML  SOLN
100.0000 mL | Freq: Once | INTRAMUSCULAR | Status: DC | PRN
  Administered 2015-04-07: 20 mL
  Filled 2015-04-07: qty 100

## 2015-04-07 NOTE — Procedures (Signed)
Interventional Radiology Procedure Note  Procedure: Placement of bilateral nephrostomy for obstruction.  35F drain bilateral.   Complications: None Recommendations:   - follow up samples - Do not submerge   - Routine care  - observe for clearing of urine  Signed,  Dulcy Fanny. Earleen Newport, DO

## 2015-04-07 NOTE — Progress Notes (Signed)
Pt placed on C/PS 5/5. Tolerating well at this time.  RT will contact doctor.

## 2015-04-07 NOTE — Progress Notes (Signed)
Subjective:  Finally was able to get bilat perc tubes- had to be done in OR also not able to extubate post op- seen in PACU- having output from his tubes already   Objective Vital signs in last 24 hours: Filed Vitals:   04/07/15 1042 04/07/15 1045 04/07/15 1050 04/07/15 1100  BP:  121/73 121/73 121/72  Pulse: 109  109   Temp: 98.4 F (36.9 C)     TempSrc:      Resp: 20  25   Height:      Weight:      SpO2:   97%    Weight change:   Intake/Output Summary (Last 24 hours) at 04/07/15 1114 Last data filed at 04/07/15 1050  Gross per 24 hour  Intake    200 ml  Output    800 ml  Net   -600 ml    Assessment/Plan: 59 year old male with multiple medical issues presenting with acute on chronic renal failure developed over the last 7 days.  1.Renal- acute on chronic renal failure that developed over the last week PTA.  ultrasound shows moderate hydronephrosis and is worse from ultrasound obtained 2 weeks ago. Urology has seen- s/p  perc tubes today.   Unfortunately I dont think  patient is a candidate for dialysis therapy of any form given his comorbidities and his baseline quality of life 2. Hypertension/volume - blood pressures high on current regimen- is overloaded - once gets unobstructed will help 3. Seizure - patient is a baseline seizure disorder. His metabolic abnormalities might have kicked off this most recent seizure. Management per primary team. CK was low 4. Anemia - multifactorial and severe. Workup per primary team- transfusion 9/8 5. Hyponatremia suspect related to hypervolemia since his obstructed and having very little urine output. Should resolve once obstruction is relieved.  6. Urothelial CA- has portacath but apparently now determined to not be candidate for therapy    Tamyrah Burbage A    Labs: Basic Metabolic Panel:  Recent Labs Lab 04/05/15 0453 04/05/15 0455 04/06/15 0449 04/07/15 0500  NA 127* 128* 128* 129*  K 4.3 4.4 4.8 5.3*  CL 98* 98* 99*  101  CO2 17* 17* 16* 13*  GLUCOSE 97 96 93 87  BUN 54* 54* 61* 72*  CREATININE 8.52* 8.66* 9.76* 11.19*  CALCIUM 7.7* 7.8* 8.0* 8.1*  PHOS 6.5*  --  6.3*  --    Liver Function Tests:  Recent Labs Lab 04/03/15 2105 04/04/15 0225 04/05/15 0453 04/06/15 0449  AST 12* 12*  --   --   ALT 8* 8*  --   --   ALKPHOS 97 92  --   --   BILITOT 0.1* 0.5  --   --   PROT 7.1 6.8  --   --   ALBUMIN 2.4* 2.3* 2.1* 2.3*   No results for input(s): LIPASE, AMYLASE in the last 168 hours.  Recent Labs Lab 04/06/15 1203 04/07/15 0500  AMMONIA 11 21   CBC:  Recent Labs Lab 04/03/15 2105 04/04/15 0225  04/05/15 0455 04/06/15 0449 04/07/15 0500  WBC 11.9* 11.6*  --  8.8 9.8 21.1*  NEUTROABS 10.6*  --   --   --   --   --   HGB 7.3* 7.2*  --  6.9* 10.0* 9.3*  HCT 21.7* 22.2*  < > 21.3* 29.5* 27.4*  MCV 81.0 81.3  --  81.3 82.2 82.0  PLT 173 156  --  153 110* 112*  < > = values in  this interval not displayed. Cardiac Enzymes:  Recent Labs Lab 04/05/15 0455  CKTOTAL 19*   CBG: No results for input(s): GLUCAP in the last 168 hours.  Iron Studies:  No results for input(s): IRON, TIBC, TRANSFERRIN, FERRITIN in the last 72 hours. Studies/Results: Ir Nephrostomy Placement Left  04/07/2015   CLINICAL DATA:  59 year old male with a history of bladder carcinoma, which has invaded the try gone causing urinary obstruction. He has received up sizing of ureteral stents with his primary urology surgeon which has not relieved the obstruction.  He has been referred for bilateral percutaneous nephrostomy.  The patient is not responsive, and is attempting to removed himself from the fluoroscopy table upon initiation of the first attempt for percutaneous placement with moderate [place]. Anesthesia team was consulted for assistance with general endotracheal tube anesthesia and need for paralysis.  EXAM: IR NEPHROSTOMY PLACEMENT LEFT; IR NEPHROSTOMY PLACEMENT RIGHT  COMPARISON:  Ultrasound 03/23/2015, CT  02/18/2015  ANESTHESIA/SEDATION: Anesthesia team was present for general endotracheal tube anesthesia.  CONTRAST:  68mL OMNIPAQUE IOHEXOL 300 MG/ML  SOLN  MEDICATIONS: 400 mg IV Cipro  FLUOROSCOPY TIME:  1 minutes 12 seconds  TECHNIQUE: The procedure, risks, benefits, and alternatives were explained to the patient's family. Questions regarding the procedure were encouraged and answered. The patient understands and consents to the procedure.  The patient was induced with anesthesia with the anesthesia team present. He was then placed in prone position on the fluoroscopy table.  The bilateral flank was prepped with chlorhexidine in a sterile fashion, and a sterile drape was applied covering the operative field. A sterile gown and sterile gloves were used for the procedure. Local anesthesia was provided with 1% Lidocaine.  Left:  The left flank was interrogated with ultrasound and the left kidney identified. The kidney is hydronephrotic. A suitable access site on the skin overlying the lower pole, posterior calix was identified. After local mg anesthesia was achieved, a small skin nick was made with an 11 blade scalpel. A 21 gauge Accustick needle was then advanced under direct sonographic guidance into the lower pole of the left inferior kidney. A 0.018 inch wire was advanced under fluoroscopic guidance into the left renal collecting system. The Accustick sheath was then advanced over the wire and a 0.018 system exchanged for a 0.035 system. Gentle hand injection of contrast material confirms placement of the sheath within the renal collecting system. A 10-French Cook all-purpose drain was then placed and positioned under fluoroscopic guidance. The locking loop is well formed within the left renal pelvis. The catheter was secured to the skin with 2-0 Prolene and a sterile bandage was placed. Catheter was left to gravity bag drainage.  A sample was sent for analysis.  Right:  The right flank was interrogated with  ultrasound and the left kidney identified. The kidney is hydronephrotic. A suitable access site on the skin overlying the lower pole, posterior calix was identified. After local mg anesthesia was achieved, a small skin nick was made with an 11 blade scalpel. A 21 gauge Accustick needle was then advanced under direct sonographic guidance into the lower pole of the right inferior kidney. A 0.018 inch wire was advanced under fluoroscopic guidance into the left renal collecting system. The Accustick sheath was then advanced over the wire and a 0.018 system exchanged for a 0.035 system. Gentle hand injection of contrast material confirms placement of the sheath within the renal collecting system. A 10-French Cook all-purpose drain was then placed and positioned under fluoroscopic  guidance. The locking loop is well formed within the left renal pelvis. The catheter was secured to the skin with 2-0 Prolene and a sterile bandage was placed. Catheter was left to gravity bag drainage.  A sample was sent for analysis.  Patient remained hemodynamically stable throughout.  No complications were encountered.  COMPLICATIONS: None  FINDINGS: Ultrasound survey demonstrates hydronephrosis of the bilateral kidneys.  Ten French percutaneous nephrostomy tube were well positioned within the left and subsequently right kidneys, as above.  IMPRESSION: Status post placement of bilateral percutaneous nephrostomy tube.  A sample was sent to the lab for analysis from each of the kidneys.  Signed,  Dulcy Fanny. Earleen Newport, DO  [facility]  [facility]   Electronically Signed   By: Corrie Mckusick D.O.   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 He has received up sizing of ureteral stents with his primary urology surgeon which has not relieved the  obstruction.  He has been referred for bilateral percutaneous nephrostomy.  The patient is not responsive, and is attempting to removed himself from the fluoroscopy table upon initiation of the first attempt for percutaneous placement with moderate [place]. Anesthesia team was consulted for assistance with general endotracheal tube anesthesia and need for paralysis.  EXAM: IR NEPHROSTOMY PLACEMENT LEFT; IR NEPHROSTOMY PLACEMENT RIGHT  COMPARISON:  Ultrasound 03/23/2015, CT 02/18/2015  ANESTHESIA/SEDATION: Anesthesia team was present for general endotracheal tube anesthesia.  CONTRAST:  66mL OMNIPAQUE IOHEXOL 300 MG/ML  SOLN  MEDICATIONS: 400 mg IV Cipro  FLUOROSCOPY TIME:  1 minutes 12 seconds  TECHNIQUE: The procedure, risks, benefits, and alternatives were explained to the patient's family. Questions regarding the procedure were encouraged and answered. The patient understands and consents to the procedure.  The patient was induced with anesthesia with the anesthesia team present. He was then placed in prone position on the fluoroscopy table.  The bilateral flank was prepped with chlorhexidine in a sterile fashion, and a sterile drape was applied covering the operative field. A sterile gown and sterile gloves were used for the procedure. Local anesthesia was provided with 1% Lidocaine.  Left:  The left flank was interrogated with ultrasound and the left kidney identified. The kidney is hydronephrotic. A suitable access site on the skin overlying the lower pole, posterior calix was identified. After local mg anesthesia was achieved, a small skin nick was made with an 11 blade scalpel. A 21 gauge Accustick needle was then advanced under direct sonographic guidance into the lower pole of the left inferior kidney. A 0.018 inch wire was advanced under fluoroscopic guidance into the left renal collecting system. The Accustick sheath was then advanced over the wire and a 0.018 system exchanged for a 0.035 system. Gentle  hand injection of contrast material confirms placement of the sheath within the renal collecting system. A 10-French Cook all-purpose drain was then placed and positioned under fluoroscopic guidance. The locking loop is well formed within the left renal pelvis. The catheter was secured to the skin with 2-0 Prolene and a sterile bandage was placed. Catheter was left to gravity bag drainage.  A sample was sent for analysis.  Right:  The right flank was interrogated with ultrasound and the left kidney identified. The kidney is hydronephrotic. A suitable access site on the skin overlying the lower pole, posterior calix was identified. After local  mg anesthesia was achieved, a small skin nick was made with an 11 blade scalpel. A 21 gauge Accustick needle was then advanced under direct sonographic guidance into the lower pole of the right inferior kidney. A 0.018 inch wire was advanced under fluoroscopic guidance into the left renal collecting system. The Accustick sheath was then advanced over the wire and a 0.018 system exchanged for a 0.035 system. Gentle hand injection of contrast material confirms placement of the sheath within the renal collecting system. A 10-French Cook all-purpose drain was then placed and positioned under fluoroscopic guidance. The locking loop is well formed within the left renal pelvis. The catheter was secured to the skin with 2-0 Prolene and a sterile bandage was placed. Catheter was left to gravity bag drainage.  A sample was sent for analysis.  Patient remained hemodynamically stable throughout.  No complications were encountered.  COMPLICATIONS: None  FINDINGS: Ultrasound survey demonstrates hydronephrosis of the bilateral kidneys.  Ten French percutaneous nephrostomy tube were well positioned within the left and subsequently right kidneys, as above.  IMPRESSION: Status post placement of bilateral percutaneous nephrostomy tube.  A sample was sent to the lab for analysis from each of the  kidneys.  Signed,  Dulcy Fanny. Earleen Newport, DO  [facility]  [facility]iology   Electronically Signed   By: Corrie Mckusick D.O.   On: 04/07/2015 10:16   Medications: Infusions:    Scheduled Medications: . [MAR Hold] sodium chloride   Intravenous Once  . [MAR Hold] amLODipine  5 mg Oral Daily  . [MAR Hold] atorvastatin  10 mg Oral q1800  . [MAR Hold] baclofen  5 mg Oral TID  . [MAR Hold] Cholecalciferol  50,000 Units Oral Q30 days  . ciprofloxacin      . [MAR Hold] docusate sodium  100 mg Oral BID  . [MAR Hold] ferrous sulfate  325 mg Oral BID WC  . [MAR Hold] folic acid  1 mg Oral Daily  . [MAR Hold] lactose free nutrition  237 mL Oral BID BM  . lactulose  300 mL Rectal Once  . [MAR Hold] lamoTRIgine  200 mg Oral BID  . [MAR Hold] metoprolol tartrate  25 mg Oral BID  . [MAR Hold] oxybutynin  5 mg Oral q morning - 10a  . [MAR Hold] pantoprazole  40 mg Oral Daily  . [MAR Hold] polyvinyl alcohol  2 drop Both Eyes TID  . [MAR Hold] senna  2 tablet Oral Daily  . [MAR Hold] silver sulfADIAZINE   Topical BID  . [MAR Hold] sodium chloride  3 mL Intravenous Q12H  . [MAR Hold] cyanocobalamin  100 mcg Oral Daily    have reviewed scheduled and prn medications.  Physical Exam: General: on vent in pacu  Heart: RRR Lungs: poor effort, dec at bases Abdomen: obese, mult scars- ostomy Extremities: high amp on left- right with edema    04/07/2015,11:14 AM  LOS: 4 days

## 2015-04-07 NOTE — Progress Notes (Signed)
admitted to PACU  Post Radiology procedure. Contact precautions initiated

## 2015-04-07 NOTE — Anesthesia Postprocedure Evaluation (Addendum)
  Anesthesia Post-op Note  Patient: Samuel [name]  Procedure(s) Performed: Procedure(s): bilateral percutaneous nephrostomy tubes in interventional radiology (Bilateral)  Patient Location: PACU  Anesthesia Type: General   Level of Consciousness: Pt remains somewhat obtunded, just as he was pre op.  Airway and Oxygen Therapy: Patient Spontanous Breathing  Post-op Pain: mild  Post-op Assessment: Post-op Vital signs reviewed  Post-op Vital Signs: Reviewed  Last Vitals:  Filed Vitals:   04/07/15 1115  BP: 118/67  Pulse:   Temp:   Resp:     Complications: No apparent anesthesia complications

## 2015-04-07 NOTE — Progress Notes (Signed)
Pt continuing to sat above 93% with 2 L nasal cannula. Pt opens eyes to verbal command however does not make eye contact. Will continue to monitor.

## 2015-04-07 NOTE — Anesthesia Procedure Notes (Signed)
Procedure Name: Intubation Date/Time: 04/07/2015 8:47 AM Performed by: Barrington Ellison Pre-anesthesia Checklist: Patient identified, Emergency Drugs available, Suction available, Patient being monitored and Timeout performed Patient Re-evaluated:Patient Re-evaluated prior to inductionOxygen Delivery Method: Circle system utilized Preoxygenation: Pre-oxygenation with 100% oxygen Intubation Type: IV induction Ventilation: Mask ventilation without difficulty and Oral airway inserted - appropriate to patient size Laryngoscope Size: Mac and 3 Grade View: Grade I Tube type: Oral Tube size: 7.5 mm Number of attempts: 1 Airway Equipment and Method: Stylet Placement Confirmation: ETT inserted through vocal cords under direct vision,  positive ETCO2 and breath sounds checked- equal and bilateral Secured at: 22 cm Tube secured with: Tape Dental Injury: Teeth and Oropharynx as per pre-operative assessment

## 2015-04-07 NOTE — Procedures (Signed)
Extubation Procedure Note  Patient Details:   Name: Samuel [name] DOB: 07-27-1956 MRN: [id number]   Airway Documentation:     Evaluation  O2 sats: stable throughout Complications: No apparent complications Patient did tolerate procedure well. Bilateral Breath Sounds: Diminished   Yes   Pt extubated per MD order. Pt tolerating well at this time.  RT will continue to monitor.  Carson Myrtle 04/07/2015, 12:00 PM

## 2015-04-07 NOTE — Progress Notes (Signed)
Pt to PACU and placed on vent per MD.  RT will continue to monitor.

## 2015-04-07 NOTE — Anesthesia Preprocedure Evaluation (Addendum)
Anesthesia Evaluation  Patient identified by MRN, date of birth, ID bandGeneral Assessment Comment:Lethargic, responds to voice and follows commands  Airway Mallampati: III  TM Distance: >3 FB Neck ROM: Full    Dental  (+) Teeth Intact, Dental Advisory Given   Pulmonary shortness of breath and at rest, former smoker,    breath sounds clear to auscultation- rhonchi       Cardiovascular hypertension, Pt. on home beta blockers and Pt. on medications + Peripheral Vascular Disease   Rhythm:Regular Rate:Tachycardia     Neuro/Psych Seizures -,  PSYCHIATRIC DISORDERS Bipolar Disorder  Neuromuscular disease    GI/Hepatic GERD  Medicated,(+) Hepatitis -, C  Endo/Other    Renal/GU ESRFRenal disease     Musculoskeletal   Abdominal   Peds  Hematology   Anesthesia Other Findings   Reproductive/Obstetrics                           Anesthesia Physical Anesthesia Plan  ASA: IV  Anesthesia Plan: General   Post-op Pain Management:    Induction:   Airway Management Planned: Oral ETT  Additional Equipment:   Intra-op Plan:   Post-operative Plan: Extubation in OR  Informed Consent: I have reviewed the patients History and Physical, chart, labs and discussed the procedure including the risks, benefits and alternatives for the proposed anesthesia with the patient or authorized representative who has indicated his/her understanding and acceptance.   Dental advisory given  Plan Discussed with: CRNA, Anesthesiologist and Surgeon  Anesthesia Plan Comments: (No consent on chart at present.  Proceduralist will come and help obtain phone consent.)       Anesthesia Quick Evaluation

## 2015-04-07 NOTE — Transfer of Care (Signed)
Immediate Anesthesia Transfer of Care Note  Patient: Samuel [name]  Procedure(s) Performed: Procedure(s): bilateral percutaneous nephrostomy tubes in interventional radiology (Bilateral)  Patient Location: PACU  Anesthesia Type:General  Level of Consciousness: lethargic and Patient remains intubated per anesthesia plan  Airway & Oxygen Therapy: Patient remains intubated per anesthesia plan and Patient placed on Ventilator (see vital sign flow sheet for setting)  Post-op Assessment: Report given to RN  Post vital signs: Reviewed and unstable  Last Vitals:  Filed Vitals:   04/07/15 0529  BP: 172/101  Pulse: 108  Temp: 36.7 C  Resp: 15    Complications: No apparent anesthesia complications and respiratory complications
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
## 2015-04-07 NOTE — Addendum Note (Signed)
Addendum  created 04/07/15 1335 by Lorrene Reid, MD   Modules edited: Notes Section   Notes Section:  File: [id number]

## 2015-04-08 DIAGNOSIS — A498 Other bacterial infections of unspecified site: Secondary | ICD-10-CM

## 2015-04-08 DIAGNOSIS — Z1612 Extended spectrum beta lactamase (ESBL) resistance: Secondary | ICD-10-CM

## 2015-04-08 LAB — BASIC METABOLIC PANEL
Anion gap: 15 (ref 5–15)
BUN: 72 mg/dL — AB (ref 6–20)
CHLORIDE: 104 mmol/L (ref 101–111)
CO2: 16 mmol/L — AB (ref 22–32)
Calcium: 8.6 mg/dL — ABNORMAL LOW (ref 8.9–10.3)
Creatinine, Ser: 10.69 mg/dL — ABNORMAL HIGH (ref 0.61–1.24)
GFR calc non Af Amer: 5 mL/min — ABNORMAL LOW (ref >60)
GFR, EST AFRICAN AMERICAN: 5 mL/min — AB (ref >60)
Glucose, Bld: 98 mg/dL (ref 65–99)
POTASSIUM: 4.6 mmol/L (ref 3.5–5.1)
SODIUM: 135 mmol/L (ref 135–145)

## 2015-04-08 LAB — CBC
HEMATOCRIT: 27.3 % — AB (ref 39.0–52.0)
Hemoglobin: 9.2 g/dL — ABNORMAL LOW (ref 13.0–17.0)
MCH: 27.9 pg (ref 26.0–34.0)
MCHC: 33.7 g/dL (ref 30.0–36.0)
MCV: 82.7 fL (ref 78.0–100.0)
Platelets: 83 10*3/uL — ABNORMAL LOW (ref 150–400)
RBC: 3.3 MIL/uL — ABNORMAL LOW (ref 4.22–5.81)
RDW: 17.5 % — AB (ref 11.5–15.5)
WBC: 12.5 10*3/uL — AB (ref 4.0–10.5)

## 2015-04-08 LAB — HEPARIN LEVEL (UNFRACTIONATED): Heparin Unfractionated: 0.28 IU/mL — ABNORMAL LOW (ref 0.30–0.70)

## 2015-04-08 LAB — URINE CULTURE

## 2015-04-08 LAB — PROTIME-INR
INR: 1.28 (ref 0.00–1.49)
Prothrombin Time: 16.1 seconds — ABNORMAL HIGH (ref 11.6–15.2)

## 2015-04-08 MED ORDER — SODIUM CHLORIDE 0.9 % IV SOLN
250.0000 mg | Freq: Two times a day (BID) | INTRAVENOUS | Status: DC
  Administered 2015-04-08 – 2015-04-11 (×6): 250 mg via INTRAVENOUS
  Filled 2015-04-08 (×7): qty 250

## 2015-04-08 MED ORDER — FLUCONAZOLE IN SODIUM CHLORIDE 200-0.9 MG/100ML-% IV SOLN
200.0000 mg | INTRAVENOUS | Status: DC
  Administered 2015-04-09 – 2015-04-13 (×6): 200 mg via INTRAVENOUS
  Filled 2015-04-08 (×7): qty 100

## 2015-04-08 MED ORDER — HEPARIN (PORCINE) IN NACL 100-0.45 UNIT/ML-% IJ SOLN
2250.0000 [IU]/h | INTRAMUSCULAR | Status: DC
  Administered 2015-04-08: 1600 [IU]/h via INTRAVENOUS
  Administered 2015-04-11 – 2015-04-12 (×2): 2100 [IU]/h via INTRAVENOUS
  Administered 2015-04-12 – 2015-04-17 (×6): 2250 [IU]/h via INTRAVENOUS
  Filled 2015-04-08 (×20): qty 250

## 2015-04-08 MED ORDER — FLUCONAZOLE IN SODIUM CHLORIDE 200-0.9 MG/100ML-% IV SOLN
200.0000 mg | Freq: Once | INTRAVENOUS | Status: AC
  Administered 2015-04-08: 200 mg via INTRAVENOUS
  Filled 2015-04-08: qty 100
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
## 2015-04-08 NOTE — Progress Notes (Signed)
Results of body fluid culture called from lab and results paged to [facility]  On call NP.

## 2015-04-08 NOTE — Progress Notes (Signed)
Subjective:  Great UOP from perc tubes so far Objective Vital signs in last 24 hours: Filed Vitals:   04/07/15 1817 04/07/15 2138 04/08/15 0510 04/08/15 0930  BP: 134/92 137/57 156/93 163/87  Pulse: 88 107 110 115  Temp: 98.4 F (36.9 C) 97.9 F (36.6 C) 98.7 F (37.1 C) 98 F (36.7 C)  TempSrc: Oral Oral Oral Oral  Resp: 16 14 16 18   Height:  5\' 4"  (1.626 m)    Weight:  107.8 kg (237 lb 10.5 oz)    SpO2: 100% 99% 100% 100%   Weight change:   Intake/Output Summary (Last 24 hours) at 04/08/15 1018 Last data filed at 04/08/15 0900  Gross per 24 hour  Intake    280 ml  Output   3470 ml  Net  -3190 ml    Assessment/Plan: 59 year old male with multiple medical issues presenting with acute on chronic renal failure developed over the last 7 days.  1.Renal- acute on chronic renal failure that developed over the last week PTA.  ultrasound shows moderate hydronephrosis and is worse from ultrasound obtained 2 weeks ago. Urology has seen- s/p  perc tubes yest.   Unfortunately I dont think  patient is [name] candidate for dialysis therapy of any form given his comorbidities and his baseline quality of life 2. Hypertension/volume - blood pressures high on current regimen- is overloaded - is now putting out.  Since BP is still high and knowing he was full prior to drainage- I do not think needs IVF to replace out put yet but will follow 3. Seizure - patient is [name] baseline seizure disorder. His metabolic abnormalities might have kicked off this most recent seizure. Management per primary team. CK was low 4. Anemia - multifactorial and severe. Workup per primary team- transfusion 9/8 5. Hyponatremia suspect related to hypervolemia since his obstructed and having very little urine output. Improving  6. Urothelial CA- has portacath but apparently now determined to not be candidate for therapy    Samuel [name]    Labs: Basic Metabolic Panel:  Recent Labs Lab 04/05/15 0453   04/06/15 0449 04/07/15 0500 04/07/15 1605 04/08/15 0455  NA 127*  < > 128* 129* 130* 135  K 4.3  < > 4.8 5.3* 4.9 4.6  CL 98*  < > 99* 101 101 104  CO2 17*  < > 16* 13* 16* 16*  GLUCOSE 97  < > 93 87 101* 98  BUN 54*  < > 61* 72* 72* 72*  CREATININE 8.52*  < > 9.76* 11.19* 11.12* 10.69*  CALCIUM 7.7*  < > 8.0* 8.1* 8.1* 8.6*  PHOS 6.5*  --  6.3*  --   --   --   < > = values in this interval not displayed. Liver Function Tests:  Recent Labs Lab 04/03/15 2105 04/04/15 0225 04/05/15 0453 04/06/15 0449  AST 12* 12*  --   --   ALT 8* 8*  --   --   ALKPHOS 97 92  --   --   BILITOT 0.1* 0.5  --   --   PROT 7.1 6.8  --   --   ALBUMIN 2.4* 2.3* 2.1* 2.3*   No results for input(s): LIPASE, AMYLASE in the last 168 hours.  Recent Labs Lab 04/06/15 1203 04/07/15 0500  AMMONIA 11 21   CBC:  Recent Labs Lab 04/03/15 2105 04/04/15 0225  04/05/15 0455 04/06/15 0449 04/07/15 0500 04/08/15 0455  WBC 11.9* 11.6*  --  8.8 9.8 21.1* 12.5*  NEUTROABS 10.6*  --   --   --   --   --   --   HGB 7.3* 7.2*  --  6.9* 10.0* 9.3* 9.2*  HCT 21.7* 22.2*  < > 21.3* 29.5* 27.4* 27.3*  MCV 81.0 81.3  --  81.3 82.2 82.0 82.7  PLT 173 156  --  153 110* 112* 83*  < > = values in this interval not displayed. Cardiac Enzymes:  Recent Labs Lab 04/05/15 0455  CKTOTAL 19*   CBG: No results for input(s): GLUCAP in the last 168 hours.  Iron Studies:  No results for input(s): IRON, TIBC, TRANSFERRIN, FERRITIN in the last 72 hours. Studies/Results: Ir Nephrostomy Placement Left  04/07/2015   CLINICAL DATA:  59 year old male with [name] history of bladder carcinoma, which has invaded the try gone causing urinary obstruction. He has received up sizing of ureteral stents with his primary urology surgeon which has not relieved the obstruction.  He has been referred for bilateral percutaneous nephrostomy.  The patient is not responsive, and is attempting to removed himself from the fluoroscopy table upon  initiation of the first attempt for percutaneous placement with moderate [place]. Anesthesia team was consulted for assistance with general endotracheal tube anesthesia and need for paralysis.  EXAM: IR NEPHROSTOMY PLACEMENT LEFT; IR NEPHROSTOMY PLACEMENT RIGHT  COMPARISON:  Ultrasound 03/23/2015, CT 02/18/2015  ANESTHESIA/SEDATION: Anesthesia team was present for general endotracheal tube anesthesia.  CONTRAST:  72mL OMNIPAQUE IOHEXOL 300 MG/ML  SOLN  MEDICATIONS: 400 mg IV Cipro  FLUOROSCOPY TIME:  1 minutes 12 seconds  TECHNIQUE: The procedure, risks, benefits, and alternatives were explained to the patient's family. Questions regarding the procedure were encouraged and answered. The patient understands and consents to the procedure.  The patient was induced with anesthesia with the anesthesia team present. He was then placed in prone position on the fluoroscopy table.  The bilateral flank was prepped with chlorhexidine in [name] sterile fashion, and [name] sterile drape was applied covering the operative field. [name] sterile gown and sterile gloves were used for the procedure. Local anesthesia was provided with 1% Lidocaine.  Left:  The left flank was interrogated with ultrasound and the left kidney identified. The kidney is hydronephrotic. [name] suitable access site on the skin overlying the lower pole, posterior calix was identified. After local mg anesthesia was achieved, [name] small skin nick was made with an 11 blade scalpel. [name] 21 gauge Accustick needle was then advanced under direct sonographic guidance into the lower pole of the left inferior kidney. [name] 0.018 inch wire was advanced under fluoroscopic guidance into the left renal collecting system. The Accustick sheath was then advanced over the wire and [name] 0.018 system exchanged for [name] 0.035 system. Gentle hand injection of contrast material confirms placement of the sheath within the renal collecting system. [name] 10-French Cook all-purpose drain was then placed and positioned under  fluoroscopic guidance. The locking loop is well formed within the left renal pelvis. The catheter was secured to the skin with 2-0 Prolene and [name] sterile bandage was placed. Catheter was left to gravity bag drainage.  [name] sample was sent for analysis.  Right:  The right flank was interrogated with ultrasound and the left kidney identified. The kidney is hydronephrotic. [name] suitable access site on the skin overlying the lower pole, posterior calix was identified. After local mg anesthesia was achieved, [name] small skin nick was made with an 11 blade scalpel. [name] 21 gauge Accustick needle was then advanced under direct sonographic guidance  into the lower pole of the right inferior kidney. [name] 0.018 inch wire was advanced under fluoroscopic guidance into the left renal collecting system. The Accustick sheath was then advanced over the wire and [name] 0.018 system exchanged for [name] 0.035 system. Gentle hand injection of contrast material confirms placement of the sheath within the renal collecting system. [name] 10-French Cook all-purpose drain was then placed and positioned under fluoroscopic guidance. The locking loop is well formed within the left renal pelvis. The catheter was secured to the skin with 2-0 Prolene and [name] sterile bandage was placed. Catheter was left to gravity bag drainage.  [name] sample was sent for analysis.  Patient remained hemodynamically stable throughout.  No complications were encountered.  COMPLICATIONS: None  FINDINGS: Ultrasound survey demonstrates hydronephrosis of the bilateral kidneys.  Ten French percutaneous nephrostomy tube were well positioned within the left and subsequently right kidneys, as above.  IMPRESSION: Status post placement of bilateral percutaneous nephrostomy tube.  [name] sample was sent to the lab for analysis from each of the kidneys.  Signed,  Dulcy Fanny. Earleen Newport, DO  [facility]  [facility]   Electronically Signed   By: Corrie Mckusick D.O.   On: 04/07/2015  10:16   Ir Nephrostomy Placement Right  04/07/2015   CLINICAL DATA:  59 year old male with [name] history of bladder carcinoma, which has invaded the try gone causing urinary obstruction. He has received up sizing of ureteral stents with his primary urology surgeon which has not relieved the obstruction.  He has been referred for bilateral percutaneous nephrostomy.  The patient is not responsive, and is attempting to removed himself from the fluoroscopy table upon initiation of the first attempt for percutaneous placement with moderate [place]. Anesthesia team was consulted for assistance with general endotracheal tube anesthesia and need for paralysis.  EXAM: IR NEPHROSTOMY PLACEMENT LEFT; IR NEPHROSTOMY PLACEMENT RIGHT  COMPARISON:  Ultrasound 03/23/2015, CT 02/18/2015  ANESTHESIA/SEDATION: Anesthesia team was present for general endotracheal tube anesthesia.  CONTRAST:  35mL OMNIPAQUE IOHEXOL 300 MG/ML  SOLN  MEDICATIONS: 400 mg IV Cipro  FLUOROSCOPY TIME:  1 minutes 12 seconds  TECHNIQUE: The procedure, risks, benefits, and alternatives were explained to the patient's family. Questions regarding the procedure were encouraged and answered. The patient understands and consents to the procedure.  The patient was induced with anesthesia with the anesthesia team present. He was then placed in prone position on the fluoroscopy table.  The bilateral flank was prepped with chlorhexidine in [name] sterile fashion, and [name] sterile drape was applied covering the operative field. [name] sterile gown and sterile gloves were used for the procedure. Local anesthesia was provided with 1% Lidocaine.  Left:  The left flank was interrogated with ultrasound and the left kidney identified. The kidney is hydronephrotic. [name] suitable access site on the skin overlying the lower pole, posterior calix was identified. After local mg anesthesia was achieved, [name] small skin nick was made with an 11 blade scalpel. [name] 21 gauge Accustick needle was then advanced  under direct sonographic guidance into the lower pole of the left inferior kidney. [name] 0.018 inch wire was advanced under fluoroscopic guidance into the left renal collecting system. The Accustick sheath was then advanced over the wire and [name] 0.018 system exchanged for [name] 0.035 system. Gentle hand injection of contrast material confirms placement of the sheath within the renal collecting system. [name] 10-French Cook all-purpose drain was then placed and positioned under fluoroscopic guidance. The locking loop is well formed within  the left renal pelvis. The catheter was secured to the skin with 2-0 Prolene and [name] sterile bandage was placed. Catheter was left to gravity bag drainage.  [name] sample was sent for analysis.  Right:  The right flank was interrogated with ultrasound and the left kidney identified. The kidney is hydronephrotic. [name] suitable access site on the skin overlying the lower pole, posterior calix was identified. After local mg anesthesia was achieved, [name] small skin nick was made with an 11 blade scalpel. [name] 21 gauge Accustick needle was then advanced under direct sonographic guidance into the lower pole of the right inferior kidney. [name] 0.018 inch wire was advanced under fluoroscopic guidance into the left renal collecting system. The Accustick sheath was then advanced over the wire and [name] 0.018 system exchanged for [name] 0.035 system. Gentle hand injection of contrast material confirms placement of the sheath within the renal collecting system. [name] 10-French Cook all-purpose drain was then placed and positioned under fluoroscopic guidance. The locking loop is well formed within the left renal pelvis. The catheter was secured to the skin with 2-0 Prolene and [name] sterile bandage was placed. Catheter was left to gravity bag drainage.  [name] sample was sent for analysis.  Patient remained hemodynamically stable throughout.  No complications were encountered.  COMPLICATIONS: None  FINDINGS: Ultrasound survey demonstrates  hydronephrosis of the bilateral kidneys.  Ten French percutaneous nephrostomy tube were well positioned within the left and subsequently right kidneys, as above.  IMPRESSION: Status post placement of bilateral percutaneous nephrostomy tube.  [name] sample was sent to the lab for analysis from each of the kidneys.  Signed,  Dulcy Fanny. Earleen Newport, DO  [facility]  [facility]   Electronically Signed   By: Corrie Mckusick D.O.   On: 04/07/2015 10:16   Medications: Infusions:    Scheduled Medications: . sodium chloride   Intravenous Once  . amLODipine  5 mg Oral Daily  . atorvastatin  10 mg Oral q1800  . baclofen  5 mg Oral TID  . [START ON 05/02/2015] Cholecalciferol  50,000 Units Oral Q30 days  . docusate sodium  100 mg Oral BID  . ferrous sulfate  325 mg Oral BID WC  . [START ON 04/09/2015] fluconazole (DIFLUCAN) IV  200 mg Intravenous Q24H  . folic acid  1 mg Oral Daily  . lactose free nutrition  237 mL Oral BID BM  . lactulose  300 mL Rectal Once  . lamoTRIgine  200 mg Oral BID  . metoprolol tartrate  25 mg Oral BID  . oxybutynin  5 mg Oral q morning - 10a  . pantoprazole  40 mg Oral Daily  . polyvinyl alcohol  2 drop Both Eyes TID  . senna  2 tablet Oral Daily  . silver sulfADIAZINE   Topical BID  . sodium chloride  3 mL Intravenous Q12H  . cyanocobalamin  100 mcg Oral Daily    have reviewed scheduled and prn medications.  Physical Exam: General: minimally interactive  Heart: RRR Lungs: poor effort, dec at bases Abdomen: obese, mult scars- ostomy Extremities: high amp on left- right with edema    04/08/2015,10:18 AM  LOS: 5 days

## 2015-04-08 NOTE — Progress Notes (Signed)
Referring Physician(s): Dr. [name]  Chief Complaint:  S/P bil Percutaneous nephrostomy tube placement 04/07/15 by Dr. Earleen Newport  Subjective:  Mr Samuel [name] is sleeping.  Difficult to arouse.  Allergies: Tomato  Medications: Prior to Admission medications   Medication Sig Start Date End Date Taking? Authorizing Provider  acetaminophen (TYLENOL) 325 MG tablet Take 650 mg by mouth every 6 (six) hours as needed for moderate pain.   Yes Historical Provider, MD  alum & mag hydroxide-simeth (MAALOX PLUS) 400-400-40 MG/5ML suspension Take 20 mLs by mouth every 6 (six) hours as needed for indigestion.   Yes Historical Provider, MD  amLODipine (NORVASC) 5 MG tablet Take 1 tablet (5 mg total) by mouth daily. 03/27/15  Yes Bonnielee Haff, MD  atorvastatin (LIPITOR) 10 MG tablet Take 10 mg by mouth daily at 6 PM.   Yes Historical Provider, MD  baclofen (LIORESAL) 10 MG tablet Take 0.5 tablets (5 mg total) by mouth 3 (three) times daily. 03/27/15  Yes Bonnielee Haff, MD  Cholecalciferol 50000 UNITS capsule Take 50,000 Units by mouth every 30 (thirty) days.   Yes Historical Provider, MD  clonazePAM (KLONOPIN) 0.5 MG tablet Take 1/2 tablet by mouth twice daily 03/27/15  Yes Bonnielee Haff, MD  cyanocobalamin 1000 MCG tablet Take 100 mcg by mouth daily.    Yes Historical Provider, MD  docusate sodium (COLACE) 100 MG capsule Take 100 mg by mouth 2 (two) times daily.   Yes Historical Provider, MD  famotidine (PEPCID) 20 MG tablet Take 20 mg by mouth at bedtime.   Yes Historical Provider, MD  ferrous sulfate 325 (65 FE) MG EC tablet Take 325 mg by mouth 2 (two) times daily.   Yes Historical Provider, MD  folic acid (FOLVITE) 1 MG tablet Take 1 mg by mouth daily.   Yes Historical Provider, MD  guaiFENesin (ROBITUSSIN) 100 MG/5ML SOLN Take 15 mLs by mouth 3 (three) times daily as needed for cough or to loosen phlegm.   Yes Historical Provider, MD  lactose free nutrition (BOOST) LIQD Take 237 mLs by mouth 2  (two) times daily between meals.   Yes Historical Provider, MD  lactulose (CHRONULAC) 10 GM/15ML solution Take 20 g by mouth 4 (four) times daily.   Yes Historical Provider, MD  lamoTRIgine (LAMICTAL) 100 MG tablet Take 300 mg by mouth 2 (two) times daily.   Yes Historical Provider, MD  lidocaine (LIDODERM) 5 % Place 2 patches onto the skin as needed (for pain). Remove & Discard patch within 12 hours or as directed by MD (apply 2 patches to left stump daily at 9pm   Yes Historical Provider, MD  linezolid (ZYVOX) 600 MG tablet Take 1 tablet (600 mg total) by mouth every 12 (twelve) hours. For 10 more days Patient taking differently: Take 600 mg by mouth every 12 (twelve) hours. Started therapy unknown. Finish therapy on 04-07-15 per [facility] from [facility] 03/27/15  Yes Bonnielee Haff, MD  metoprolol tartrate (LOPRESSOR) 25 MG tablet Take 1 tablet (25 mg total) by mouth 2 (two) times daily. 09/28/14  Yes Orson Eva, MD  omeprazole (PRILOSEC) 20 MG capsule Take 20 mg by mouth daily.   Yes Historical Provider, MD  ondansetron (ZOFRAN) 4 MG tablet Take 4 mg by mouth every 8 (eight) hours as needed for nausea or vomiting.   Yes Historical Provider, MD  oxybutynin (DITROPAN-XL) 5 MG 24 hr tablet Take 1 tablet (5 mg total) by mouth every morning. 03/27/15  Yes Bonnielee Haff, MD  Polyethyl Glycol-Propyl Glycol (SYSTANE)  0.4-0.3 % SOLN Apply 2 drops to eye 3 (three) times daily.   Yes Historical Provider, MD  pregabalin (LYRICA) 25 MG capsule Take 25 mg by mouth 3 (three) times daily.   Yes Historical Provider, MD  promethazine (PHENERGAN) 25 MG tablet Take 25 mg by mouth every 8 (eight) hours as needed for nausea or vomiting.   Yes Historical Provider, MD  promethazine (PHENERGAN) 25 MG/ML injection Inject 25 mg into the muscle every 4 (four) hours as needed for nausea or vomiting.   Yes Historical Provider, MD  QUEtiapine (SEROQUEL) 300 MG tablet Take 300 mg by mouth at bedtime.   Yes Historical Provider, MD  senna  (SENOKOT) 8.6 MG tablet Take 2 tablets by mouth every morning.   Yes Historical Provider, MD  silver sulfADIAZINE (SILVADENE) 1 % cream Apply topically 2 (two) times daily. 03/27/15  Yes Bonnielee Haff, MD  traZODone (DESYREL) 50 MG tablet Take 1 tablet (50 mg total) by mouth at bedtime. 03/27/15  Yes Bonnielee Haff, MD  warfarin (COUMADIN) 2.5 MG tablet Take 2.5 mg by mouth daily.   Yes Historical Provider, MD  lamoTRIgine (LAMICTAL) 200 MG tablet Take 0.5 tablets (100 mg total) by mouth 2 (two) times daily. Patient not taking: Reported on 04/03/2015 02/22/15   Nishant Dhungel, MD  nitroGLYCERIN (NITROSTAT) 0.4 MG SL tablet Place 0.4 mg under the tongue every 5 (five) minutes as needed for chest pain.    Historical Provider, MD  oxyCODONE (OXY IR/ROXICODONE) 5 MG immediate release tablet Take 1 tablet (5 mg total) by mouth every 4 (four) hours as needed for moderate pain or severe pain. Patient not taking: Reported on 04/03/2015 03/27/15   Bonnielee Haff, MD     Vital Signs: BP 163/87 mmHg  Pulse 115  Temp(Src) 98 F (36.7 C) (Oral)  Resp 18  Ht 5\' 4"  (1.626 m)  Wt 237 lb 10.5 oz (107.8 kg)  BMI 40.77 kg/m2  SpO2 100%  Physical Exam   Asleep No abdominal pain with palpation Abdomen soft Perc neph tubes in place bilaterally, look good I drained the right tube, ~650 mls blood tinged urine, I also drained the left tube, 350 mls hazy yellow urine.  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 He has received up sizing of ureteral stents with his primary urology surgeon which has not relieved the obstruction.  He has been referred for bilateral percutaneous nephrostomy.  The patient is not responsive, and is attempting to removed himself from the fluoroscopy table upon initiation of the first attempt for percutaneous placement with moderate [place]. Anesthesia  team was consulted for assistance with general endotracheal tube anesthesia and need for paralysis.  EXAM: IR NEPHROSTOMY PLACEMENT LEFT; IR NEPHROSTOMY PLACEMENT RIGHT  COMPARISON:  Ultrasound 03/23/2015, CT 02/18/2015  ANESTHESIA/SEDATION: Anesthesia team was present for general endotracheal tube anesthesia.  CONTRAST:  62mL OMNIPAQUE IOHEXOL 300 MG/ML  SOLN  MEDICATIONS: 400 mg IV Cipro  FLUOROSCOPY TIME:  1 minutes 12 seconds  TECHNIQUE: The procedure, risks, benefits, and alternatives were explained to the patient's family. Questions regarding the procedure were encouraged and answered. The patient understands and consents to the procedure.  The patient was induced with anesthesia with the anesthesia team present. He was then placed in prone position on the fluoroscopy table.  The bilateral flank was prepped with chlorhexidine in a sterile fashion, and a sterile drape was  applied covering the operative field. A sterile gown and sterile gloves were used for the procedure. Local anesthesia was provided with 1% Lidocaine.  Left:  The left flank was interrogated with ultrasound and the left kidney identified. The kidney is hydronephrotic. A suitable access site on the skin overlying the lower pole, posterior calix was identified. After local mg anesthesia was achieved, a small skin nick was made with an 11 blade scalpel. A 21 gauge Accustick needle was then advanced under direct sonographic guidance into the lower pole of the left inferior kidney. A 0.018 inch wire was advanced under fluoroscopic guidance into the left renal collecting system. The Accustick sheath was then advanced over the wire and a 0.018 system exchanged for a 0.035 system. Gentle hand injection of contrast material confirms placement of the sheath within the renal collecting system. A 10-French Cook all-purpose drain was then placed and positioned under fluoroscopic guidance. The locking loop is well formed within the left renal pelvis. The  catheter was secured to the skin with 2-0 Prolene and a sterile bandage was placed. Catheter was left to gravity bag drainage.  A sample was sent for analysis.  Right:  The right flank was interrogated with ultrasound and the left kidney identified. The kidney is hydronephrotic. A suitable access site on the skin overlying the lower pole, posterior calix was identified. After local mg anesthesia was achieved, a small skin nick was made with an 11 blade scalpel. A 21 gauge Accustick needle was then advanced under direct sonographic guidance into the lower pole of the right inferior kidney. A 0.018 inch wire was advanced under fluoroscopic guidance into the left renal collecting system. The Accustick sheath was then advanced over the wire and a 0.018 system exchanged for a 0.035 system. Gentle hand injection of contrast material confirms placement of the sheath within the renal collecting system. A 10-French Cook all-purpose drain was then placed and positioned under fluoroscopic guidance. The locking loop is well formed within the left renal pelvis. The catheter was secured to the skin with 2-0 Prolene and a sterile bandage was placed. Catheter was left to gravity bag drainage.  A sample was sent for analysis.  Patient remained hemodynamically stable throughout.  No complications were encountered.  COMPLICATIONS: None  FINDINGS: Ultrasound survey demonstrates hydronephrosis of the bilateral kidneys.  Ten French percutaneous nephrostomy tube were well positioned within the left and subsequently right kidneys, as above.  IMPRESSION: Status post placement of bilateral percutaneous nephrostomy tube.  A sample was sent to the lab for analysis from each of the kidneys.  Signed,  Dulcy Fanny. Earleen Newport, DO  [facility]  [facility]   Electronically Signed   By: Corrie Mckusick D.O.   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 He has received up sizing of ureteral stents with his primary urology surgeon which has not relieved the obstruction.  He has been referred for bilateral percutaneous nephrostomy.  The patient is not responsive, and is attempting to removed himself from the fluoroscopy table upon initiation of the first attempt for percutaneous placement with moderate [place]. Anesthesia team was consulted for assistance with general endotracheal tube anesthesia and need for paralysis.  EXAM: IR NEPHROSTOMY PLACEMENT LEFT; IR NEPHROSTOMY PLACEMENT RIGHT  COMPARISON:  Ultrasound 03/23/2015, CT 02/18/2015  ANESTHESIA/SEDATION: Anesthesia team was present for general  endotracheal tube anesthesia.  CONTRAST:  57mL OMNIPAQUE IOHEXOL 300 MG/ML  SOLN  MEDICATIONS: 400 mg IV Cipro  FLUOROSCOPY TIME:  1 minutes 12 seconds  TECHNIQUE: The procedure, risks, benefits, and alternatives were explained to the patient's family. Questions regarding the procedure were encouraged and answered. The patient understands and consents to the procedure.  The patient was induced with anesthesia with the anesthesia team present. He was then placed in prone position on the fluoroscopy table.  The bilateral flank was prepped with chlorhexidine in a sterile fashion, and a sterile drape was applied covering the operative field. A sterile gown and sterile gloves were used for the procedure. Local anesthesia was provided with 1% Lidocaine.  Left:  The left flank was interrogated with ultrasound and the left kidney identified. The kidney is hydronephrotic. A suitable access site on the skin overlying the lower pole, posterior calix was identified. After local mg anesthesia was achieved, a small skin nick was made with an 11 blade scalpel. A 21 gauge Accustick needle was then advanced under direct sonographic guidance into the lower pole of the left inferior kidney. A 0.018  inch wire was advanced under fluoroscopic guidance into the left renal collecting system. The Accustick sheath was then advanced over the wire and a 0.018 system exchanged for a 0.035 system. Gentle hand injection of contrast material confirms placement of the sheath within the renal collecting system. A 10-French Cook all-purpose drain was then placed and positioned under fluoroscopic guidance. The locking loop is well formed within the left renal pelvis. The catheter was secured to the skin with 2-0 Prolene and a sterile bandage was placed. Catheter was left to gravity bag drainage.  A sample was sent for analysis.  Right:  The right flank was interrogated with ultrasound and the left kidney identified. The kidney is hydronephrotic. A suitable access site on the skin overlying the lower pole, posterior calix was identified. After local mg anesthesia was achieved, a small skin nick was made with an 11 blade scalpel. A 21 gauge Accustick needle was then advanced under direct sonographic guidance into the lower pole of the right inferior kidney. A 0.018 inch wire was advanced under fluoroscopic guidance into the left renal collecting system. The Accustick sheath was then advanced over the wire and a 0.018 system exchanged for a 0.035 system. Gentle hand injection of contrast material confirms placement of the sheath within the renal collecting system. A 10-French Cook all-purpose drain was then placed and positioned under fluoroscopic guidance. The locking loop is well formed within the left renal pelvis. The catheter was secured to the skin with 2-0 Prolene and a sterile bandage was placed. Catheter was left to gravity bag drainage.  A sample was sent for analysis.  Patient remained hemodynamically stable throughout.  No complications were encountered.  COMPLICATIONS: None  FINDINGS: Ultrasound survey demonstrates hydronephrosis of the bilateral kidneys.  Ten French percutaneous nephrostomy tube were well positioned  within the left and subsequently right kidneys, as above.  IMPRESSION: Status post placement of bilateral percutaneous nephrostomy tube.  A sample was sent to the lab for analysis from each of the kidneys.  Signed,  Dulcy Fanny. Earleen Newport, DO  [facility]  [facility]   Electronically Signed   By: Corrie Mckusick D.O.   On: 04/07/2015 10:16    Labs:  CBC:  Recent Labs  04/05/15 0455 04/06/15 0449 04/07/15 0500 04/08/15 0455  WBC 8.8 9.8 21.1* 12.5*  HGB 6.9* 10.0* 9.3*  9.2*  HCT 21.3* 29.5* 27.4* 27.3*  PLT 153 110* 112* 83*    COAGS:  Recent Labs  09/26/14 1055  11/10/14 0855 11/28/14 1330  02/18/15 0025  04/06/15 0449 04/06/15 1203 04/07/15 0500 04/08/15 0455  INR 1.14  < > 1.21 1.21  < > 3.54*  < > 1.76* 1.48 1.41 1.28  APTT 32  --  27 37  --  52*  --   --   --   --   --   < > = values in this interval not displayed.  BMP:  Recent Labs  04/06/15 0449 04/07/15 0500 04/07/15 1605 04/08/15 0455  NA 128* 129* 130* 135  K 4.8 5.3* 4.9 4.6  CL 99* 101 101 104  CO2 16* 13* 16* 16*  GLUCOSE 93 87 101* 98  BUN 61* 72* 72* 72*  CALCIUM 8.0* 8.1* 8.1* 8.6*  CREATININE 9.76* 11.19* 11.12* 10.69*  GFRNONAA 5* 4* 4* 5*  GFRAA 6* 5* 5* 5*    LIVER FUNCTION TESTS:  Recent Labs  03/25/15 1149 03/26/15 0400 04/03/15 2105 04/04/15 0225 04/05/15 0453 04/06/15 0449  BILITOT 0.2* 0.4 0.1* 0.5  --   --   AST 16 12* 12* 12*  --   --   ALT 8* 7* 8* 8*  --   --   ALKPHOS 75 68 97 92  --   --   PROT 6.5 6.5 7.1 6.8  --   --   ALBUMIN 2.1* 2.0* 2.4* 2.3* 2.1* 2.3*    Assessment and Plan:  S/P Bilateral percutaneous nephrostomy tube placement 04/07/15 by Dr. Earleen Newport  - follow up samples - Do not submerge  - Routine care  - observe for clearing of urine  Signed: WENDY S BLAIR PA-C 04/08/2015, 11:28 AM   I spent a total of 15 Minutes at the the patient's bedside AND on the patient's hospital floor or unit, greater than  50% of which was counseling/coordinating care for bil perc nephrostomy tubes.

## 2015-04-08 NOTE — Progress Notes (Signed)
Patient does not follow any commands. I did not feel comfortable giving him anything PO at this time. Dr. Curly Rim notified about patient not receiving any of his PO medications. Orders changed. Also, speech eval ordered. Will continue to monitor.  Joellen Jersey, RN.
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
## 2015-04-08 NOTE — Progress Notes (Addendum)
ANTIFUNGAL CONSULT NOTE - INITIAL  Pharmacy Consult for fluconazole, Primaxin Indication: yeast in kidneys, ESBL UTI   Labs:  Recent Labs  04/05/15 0455 04/06/15 0449 04/07/15 0500 04/07/15 1605  WBC 8.8 9.8 21.1*  --   HGB 6.9* 10.0* 9.3*  --   PLT 153 110* 112*  --   CREATININE 8.66* 9.76* 11.19* 11.12*   Estimated Creatinine Clearance: 8.1 mL/min (by C-G formula based on Cr of 11.12).   Microbiology: Recent Results (from the past 720 hour(s))  MRSA PCR Screening     Status: None   Collection Time: 03/23/15  3:28 PM  Result Value Ref Range Status   MRSA by PCR NEGATIVE NEGATIVE Final    Comment:        The GeneXpert MRSA Assay (FDA approved for NASAL specimens only), is one component of a comprehensive MRSA colonization surveillance program. It is not intended to diagnose MRSA infection nor to guide or monitor treatment for MRSA infections.   Culture, Urine     Status: None (Preliminary result)   Collection Time: 04/05/15  6:38 PM  Result Value Ref Range Status   Specimen Description URINE, CATHETERIZED  Final   Special Requests NONE  Final   Culture >=100,000 COLONIES/mL GRAM NEGATIVE RODS  Final   Report Status PENDING  Incomplete  Culture, body fluid-bottle     Status: None (Preliminary result)   Collection Time: 04/07/15  9:33 AM  Result Value Ref Range Status   Specimen Description FLUID RIGHT KIDNEY  Final   Special Requests NONE  Final   Gram Stain   Final    YEAST IN BOTH AEROBIC AND ANAEROBIC BOTTLES AEROBIC AND ANAEROBIC BOTTLES    Culture PENDING  Incomplete   Report Status PENDING  Incomplete  Gram stain     Status: None   Collection Time: 04/07/15  9:33 AM  Result Value Ref Range Status   Specimen Description FLUID RIGHT KIDNEY  Final   Special Requests NONE  Final   Gram Stain   Final    FEW WBC PRESENT,BOTH PMN AND MONONUCLEAR FEW YEAST    Report Status 04/07/2015 FINAL  Final  Culture, body fluid-bottle     Status: None (Preliminary  result)   Collection Time: 04/07/15  9:33 AM  Result Value Ref Range Status   Specimen Description FLUID KIDNEY LEFT  Final   Special Requests NONE  Final   Gram Stain   Final    YEAST HYPHAL ELEMENTS SEEN IN BOTH AEROBIC AND ANAEROBIC BOTTLES CRITICAL RESULT CALLED TO, READ BACK BY AND VERIFIED WITH: K WICKER @1128  04/07/15 MKELLY    Culture PENDING  Incomplete   Report Status PENDING  Incomplete  Gram stain     Status: None   Collection Time: 04/07/15  9:33 AM  Result Value Ref Range Status   Specimen Description FLUID KIDNEY LEFT  Final   Special Requests NONE  Final   Gram Stain   Final    ABUNDANT WBC PRESENT,BOTH PMN AND MONONUCLEAR FEW YEAST    Report Status 04/07/2015 FINAL  Final    Assessment/Plan:  59yo male admitted 9/7 w/ sz activity, found to be in ARF, thought to be related to bladder cancer --> obstructive uropathy, bilateral nephrostomy tubes placed, now renal fluid is showing yeast in all samples, to begin antifungal.  Will begin fluconazole 200mg  IV Q24H and monitor CBC and Cx.  Consider decreasing to 100mg  IV daily if determined yeast is local to urogenital sites only and ARF continues.  Wynona Neat, PharmD, BCPS  04/08/2015,12:25 AM  Adding Primaxin for ESBL UTI  Plan: Primaxin 250 mg iv Q 12 hours  Thank you Anette Guarneri, PharmD [phone]

## 2015-04-08 NOTE — Progress Notes (Signed)
1 Day Post-Op   Subjective:  1 - Metastatic Bladder Cancer - s/p palliative chemo around 12/2014 for stage 4 bladder cancer with malignant ureteral obstruction and diffuse pelvic adenoapthy. Most recent pelvic imaging 01/2015 with stable pelvic disease.  2 - Renal Failure / Malignant Ureteral Obstruction - known malignant obstruction from bladder cancer. Managed with JJ stents, then failed with Cr up nd persistant / progressive hydro starting 02/2015. Now s/p bilat neph tubes 04/07/2015. Baselien Cr approx 1.5.   Today "Samuel [name]" is seen in f/u above. Having copious diuresis from neph tuebs (>3L in <24 hrs) and Cr trending better. He remains obtunded.  Objective: Vital signs in last 24 hours: Temp:  [97.9 F (36.6 C)-98.7 F (37.1 C)] 98.7 F (37.1 C) (09/11 0510) Pulse Rate:  [88-110] 110 (09/11 0510) Resp:  [13-27] 16 (09/11 0510) BP: (79-156)/(56-93) 156/93 mmHg (09/11 0510) SpO2:  [93 %-100 %] 100 % (09/11 0510) FiO2 (%):  [40 %] 40 % (09/10 1151) Weight:  [107.8 kg (237 lb 10.5 oz)] 107.8 kg (237 lb 10.5 oz) (09/10 2138) Last BM Date: 04/04/15  Intake/Output from previous day: 09/10 0701 - 09/11 0700 In: 280 [I.V.:280] Out: 3470 [Urine:3470] Intake/Output this shift:    General appearance: slowed mentation and obtunded Head: Normocephalic, without obvious abnormality, atraumatic Nose: Nares normal. Septum midline. Mucosa normal. No drainage or sinus tenderness. Throat: lips, mucosa, and tongue normal; teeth and gums normal Neck: supple, symmetrical, trachea midline Back: symmetric, no curvature. ROM normal. No CVA tenderness., bilat neph tubes in place with copious urine. Rt side with some blood w/o clots.  Resp: coars on [place] O2 Cardio: regular tachycardia GI: LLQ colostomy patent. Obese abd with multipe scars Male genitalia: normal, foley c/d/i with scant urine.  Pelvic: external genitalia normal Extremities: s/p LLE AKA Neurologic: Mental status: AO x 0.   Lab Results:    Recent Labs  04/07/15 0500 04/08/15 0455  WBC 21.1* 12.5*  HGB 9.3* 9.2*  HCT 27.4* 27.3*  PLT 112* 83*   BMET  Recent Labs  04/07/15 1605 04/08/15 0455  NA 130* 135  K 4.9 4.6  CL 101 104  CO2 16* 16*  GLUCOSE 101* 98  BUN 72* 72*  CREATININE 11.12* 10.69*  CALCIUM 8.1* 8.6*   PT/INR  Recent Labs  04/07/15 0500 04/08/15 0455  LABPROT 17.3* 16.1*  INR 1.41 1.28   ABG No results for input(s): PHART, HCO3 in the last 72 hours.  Invalid input(s): PCO2, PO2  Studies/Results: Ir Nephrostomy Placement Left  04/07/2015   CLINICAL DATA:  59 year old male with a history of bladder carcinoma, which has invaded the try gone causing urinary obstruction. He has received up sizing of ureteral stents with his primary urology surgeon which has not relieved the obstruction.  He has been referred for bilateral percutaneous nephrostomy.  The patient is not responsive, and is attempting to removed himself from the fluoroscopy table upon initiation of the first attempt for percutaneous placement with moderate [place]. Anesthesia team was consulted for assistance with general endotracheal tube anesthesia and need for paralysis.  EXAM: IR NEPHROSTOMY PLACEMENT LEFT; IR NEPHROSTOMY PLACEMENT RIGHT  COMPARISON:  Ultrasound 03/23/2015, CT 02/18/2015  ANESTHESIA/SEDATION: Anesthesia team was present for general endotracheal tube anesthesia.  CONTRAST:  53mL OMNIPAQUE IOHEXOL 300 MG/ML  SOLN  MEDICATIONS: 400 mg IV Cipro  FLUOROSCOPY TIME:  1 minutes 12 seconds  TECHNIQUE: The procedure, risks, benefits, and alternatives were explained to the patient's family. Questions regarding the procedure were encouraged and answered.  The patient understands and consents to the procedure.  The patient was induced with anesthesia with the anesthesia team present. He was then placed in prone position on the fluoroscopy table.  The bilateral flank was prepped with chlorhexidine in a sterile fashion, and a sterile  drape was applied covering the operative field. A sterile gown and sterile gloves were used for the procedure. Local anesthesia was provided with 1% Lidocaine.  Left:  The left flank was interrogated with ultrasound and the left kidney identified. The kidney is hydronephrotic. A suitable access site on the skin overlying the lower pole, posterior calix was identified. After local mg anesthesia was achieved, a small skin nick was made with an 11 blade scalpel. A 21 gauge Accustick needle was then advanced under direct sonographic guidance into the lower pole of the left inferior kidney. A 0.018 inch wire was advanced under fluoroscopic guidance into the left renal collecting system. The Accustick sheath was then advanced over the wire and a 0.018 system exchanged for a 0.035 system. Gentle hand injection of contrast material confirms placement of the sheath within the renal collecting system. A 10-French Cook all-purpose drain was then placed and positioned under fluoroscopic guidance. The locking loop is well formed within the left renal pelvis. The catheter was secured to the skin with 2-0 Prolene and a sterile bandage was placed. Catheter was left to gravity bag drainage.  A sample was sent for analysis.  Right:  The right flank was interrogated with ultrasound and the left kidney identified. The kidney is hydronephrotic. A suitable access site on the skin overlying the lower pole, posterior calix was identified. After local mg anesthesia was achieved, a small skin nick was made with an 11 blade scalpel. A 21 gauge Accustick needle was then advanced under direct sonographic guidance into the lower pole of the right inferior kidney. A 0.018 inch wire was advanced under fluoroscopic guidance into the left renal collecting system. The Accustick sheath was then advanced over the wire and a 0.018 system exchanged for a 0.035 system. Gentle hand injection of contrast material confirms placement of the sheath within the  renal collecting system. A 10-French Cook all-purpose drain was then placed and positioned under fluoroscopic guidance. The locking loop is well formed within the left renal pelvis. The catheter was secured to the skin with 2-0 Prolene and a sterile bandage was placed. Catheter was left to gravity bag drainage.  A sample was sent for analysis.  Patient remained hemodynamically stable throughout.  No complications were encountered.  COMPLICATIONS: None  FINDINGS: Ultrasound survey demonstrates hydronephrosis of the bilateral kidneys.  Ten French percutaneous nephrostomy tube were well positioned within the left and subsequently right kidneys, as above.  IMPRESSION: Status post placement of bilateral percutaneous nephrostomy tube.  A sample was sent to the lab for analysis from each of the kidneys.  Signed,  Dulcy Fanny. Earleen Newport, DO  [facility]  [facility]   Electronically Signed   By: Corrie Mckusick D.O.   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 He has received up sizing of ureteral stents with his primary urology surgeon which has not relieved the obstruction.  He has been referred for bilateral percutaneous nephrostomy.  The patient is not responsive, and is attempting to removed himself from the fluoroscopy table upon initiation of the  first attempt for percutaneous placement with moderate [place]. Anesthesia team was consulted for assistance with general endotracheal tube anesthesia and need for paralysis.  EXAM: IR NEPHROSTOMY PLACEMENT LEFT; IR NEPHROSTOMY PLACEMENT RIGHT  COMPARISON:  Ultrasound 03/23/2015, CT 02/18/2015  ANESTHESIA/SEDATION: Anesthesia team was present for general endotracheal tube anesthesia.  CONTRAST:  75mL OMNIPAQUE IOHEXOL 300 MG/ML  SOLN  MEDICATIONS: 400 mg IV Cipro  FLUOROSCOPY TIME:  1  minutes 12 seconds  TECHNIQUE: The procedure, risks, benefits, and alternatives were explained to the patient's family. Questions regarding the procedure were encouraged and answered. The patient understands and consents to the procedure.  The patient was induced with anesthesia with the anesthesia team present. He was then placed in prone position on the fluoroscopy table.  The bilateral flank was prepped with chlorhexidine in a sterile fashion, and a sterile drape was applied covering the operative field. A sterile gown and sterile gloves were used for the procedure. Local anesthesia was provided with 1% Lidocaine.  Left:  The left flank was interrogated with ultrasound and the left kidney identified. The kidney is hydronephrotic. A suitable access site on the skin overlying the lower pole, posterior calix was identified. After local mg anesthesia was achieved, a small skin nick was made with an 11 blade scalpel. A 21 gauge Accustick needle was then advanced under direct sonographic guidance into the lower pole of the left inferior kidney. A 0.018 inch wire was advanced under fluoroscopic guidance into the left renal collecting system. The Accustick sheath was then advanced over the wire and a 0.018 system exchanged for a 0.035 system. Gentle hand injection of contrast material confirms placement of the sheath within the renal collecting system. A 10-French Cook all-purpose drain was then placed and positioned under fluoroscopic guidance. The locking loop is well formed within the left renal pelvis. The catheter was secured to the skin with 2-0 Prolene and a sterile bandage was placed. Catheter was left to gravity bag drainage.  A sample was sent for analysis.  Right:  The right flank was interrogated with ultrasound and the left kidney identified. The kidney is hydronephrotic. A suitable access site on the skin overlying the lower pole, posterior calix was identified. After local mg anesthesia was achieved, a  small skin nick was made with an 11 blade scalpel. A 21 gauge Accustick needle was then advanced under direct sonographic guidance into the lower pole of the right inferior kidney. A 0.018 inch wire was advanced under fluoroscopic guidance into the left renal collecting system. The Accustick sheath was then advanced over the wire and a 0.018 system exchanged for a 0.035 system. Gentle hand injection of contrast material confirms placement of the sheath within the renal collecting system. A 10-French Cook all-purpose drain was then placed and positioned under fluoroscopic guidance. The locking loop is well formed within the left renal pelvis. The catheter was secured to the skin with 2-0 Prolene and a sterile bandage was placed. Catheter was left to gravity bag drainage.  A sample was sent for analysis.  Patient remained hemodynamically stable throughout.  No complications were encountered.  COMPLICATIONS: None  FINDINGS: Ultrasound survey demonstrates hydronephrosis of the bilateral kidneys.  Ten French percutaneous nephrostomy tube were well positioned within the left and subsequently right kidneys, as above.  IMPRESSION: Status post placement of bilateral percutaneous nephrostomy tube.  A sample was sent to the lab for analysis from each of the kidneys.  Signed,  Dulcy Fanny. Earleen Newport DO  [facility]  [facility]   Electronically Signed   By: Corrie Mckusick D.O.   On: 04/07/2015 10:16    Anti-infectives: Anti-infectives    Start     Dose/Rate Route Frequency Ordered Stop   04/09/15 0000  fluconazole (DIFLUCAN) IVPB 200 mg     200 mg 100 mL/hr over 60 Minutes Intravenous Every 24 hours 04/08/15 0025     04/08/15 0030  fluconazole (DIFLUCAN) IVPB 200 mg     200 mg 100 mL/hr over 60 Minutes Intravenous  Once 04/08/15 0025 04/08/15 0212   04/07/15 0920  ciprofloxacin (CIPRO) 400 MG/200ML IVPB    Comments:  Henrine Screws   : cabinet override      04/07/15 0920  04/07/15 2129   04/06/15 1615  ciprofloxacin (CIPRO) IVPB 400 mg     400 mg 200 mL/hr over 60 Minutes Intravenous To Radiology 04/06/15 1606 04/07/15 1025   04/04/15 1000  linezolid (ZYVOX) tablet 600 mg  Status:  Discontinued     600 mg Oral Every 12 hours 04/04/15 0045 04/07/15 5374      Assessment/Plan:  1 - Metastatic Bladder Cancer - prognosis extremely poor. Any further therapy palliative only.   2 - Renal Failure / Malignant Ureteral Obstruction - improving with bilat neph tubes, appreciate IR help. He is at substantial risk for post-obstructive diuresis / intravascular volume depletion. May consider IVF to keep up with this.   Will follow prn in house, please call with questions anytime.  [facility], Sehaj Mcenroe 04/08/2015
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
## 2015-04-09 ENCOUNTER — Encounter (HOSPITAL_COMMUNITY): Payer: Self-pay | Admitting: Radiology

## 2015-04-09 ENCOUNTER — Inpatient Hospital Stay (HOSPITAL_COMMUNITY): Payer: Medicare Other

## 2015-04-09 DIAGNOSIS — J690 Pneumonitis due to inhalation of food and vomit: Secondary | ICD-10-CM

## 2015-04-09 DIAGNOSIS — A419 Sepsis, unspecified organism: Secondary | ICD-10-CM

## 2015-04-09 DIAGNOSIS — J9601 Acute respiratory failure with hypoxia: Secondary | ICD-10-CM

## 2015-04-09 LAB — BASIC METABOLIC PANEL
Anion gap: 20 — ABNORMAL HIGH (ref 5–15)
BUN: 64 mg/dL — AB (ref 6–20)
CHLORIDE: 109 mmol/L (ref 101–111)
CO2: 13 mmol/L — AB (ref 22–32)
Calcium: 8.3 mg/dL — ABNORMAL LOW (ref 8.9–10.3)
Creatinine, Ser: 7.34 mg/dL — ABNORMAL HIGH (ref 0.61–1.24)
GFR calc Af Amer: 8 mL/min — ABNORMAL LOW (ref >60)
GFR calc non Af Amer: 7 mL/min — ABNORMAL LOW (ref >60)
GLUCOSE: 142 mg/dL — AB (ref 65–99)
POTASSIUM: 4.1 mmol/L (ref 3.5–5.1)
Sodium: 142 mmol/L (ref 135–145)

## 2015-04-09 LAB — CBC
HCT: 30 % — ABNORMAL LOW (ref 39.0–52.0)
HEMOGLOBIN: 9.7 g/dL — AB (ref 13.0–17.0)
MCH: 27.2 pg (ref 26.0–34.0)
MCHC: 32.3 g/dL (ref 30.0–36.0)
MCV: 84 fL (ref 78.0–100.0)
Platelets: 121 10*3/uL — ABNORMAL LOW (ref 150–400)
RBC: 3.57 MIL/uL — AB (ref 4.22–5.81)
RDW: 17.8 % — ABNORMAL HIGH (ref 11.5–15.5)
WBC: 12.7 10*3/uL — ABNORMAL HIGH (ref 4.0–10.5)

## 2015-04-09 LAB — HEPARIN LEVEL (UNFRACTIONATED)
HEPARIN UNFRACTIONATED: 0.48 [IU]/mL (ref 0.30–0.70)
Heparin Unfractionated: 0.37 IU/mL (ref 0.30–0.70)

## 2015-04-09 LAB — LACTIC ACID, PLASMA
LACTIC ACID, VENOUS: 2.7 mmol/L — AB (ref 0.5–2.0)
LACTIC ACID, VENOUS: 2.4 mmol/L — AB (ref 0.5–2.0)

## 2015-04-09 LAB — PROTIME-INR
INR: 1.37 (ref 0.00–1.49)
Prothrombin Time: 16.9 seconds — ABNORMAL HIGH (ref 11.6–15.2)

## 2015-04-09 MED ORDER — MORPHINE SULFATE (PF) 2 MG/ML IV SOLN
1.0000 mg | Freq: Once | INTRAVENOUS | Status: AC
  Administered 2015-04-09: 1 mg via INTRAVENOUS
  Filled 2015-04-09: qty 1

## 2015-04-09 MED ORDER — SODIUM CHLORIDE 0.9 % IV SOLN
1250.0000 mg | INTRAVENOUS | Status: DC
  Administered 2015-04-11: 1250 mg via INTRAVENOUS
  Filled 2015-04-09 (×2): qty 1250

## 2015-04-09 MED ORDER — ACETAMINOPHEN 10 MG/ML IV SOLN
1000.0000 mg | Freq: Once | INTRAVENOUS | Status: AC
  Administered 2015-04-09: 1000 mg via INTRAVENOUS
  Filled 2015-04-09: qty 100

## 2015-04-09 MED ORDER — VANCOMYCIN HCL 10 G IV SOLR
2000.0000 mg | INTRAVENOUS | Status: AC
  Administered 2015-04-09: 2000 mg via INTRAVENOUS
  Filled 2015-04-09: qty 2000

## 2015-04-09 MED ORDER — SODIUM BICARBONATE 8.4 % IV SOLN
INTRAVENOUS | Status: DC
  Administered 2015-04-09 – 2015-04-12 (×2): via INTRAVENOUS
  Filled 2015-04-09 (×9): qty 75

## 2015-04-09 MED ORDER — MORPHINE SULFATE (PF) 2 MG/ML IV SOLN
1.0000 mg | INTRAVENOUS | Status: DC | PRN
  Administered 2015-04-09 – 2015-04-10 (×2): 1 mg via INTRAVENOUS
  Filled 2015-04-09 (×2): qty 1

## 2015-04-09 NOTE — Progress Notes (Signed)
2 Days Post-Op Subjective: Samuel [name] is not communicating, but is arousable and looking around the room. His daughters and their mom is here with him.   Objective: Vital signs in last 24 hours: Temp:  [99.4 F (37.4 C)-102.7 F (39.3 C)] 99.4 F (37.4 C) (09/12 0923) Pulse Rate:  [101-136] 116 (09/12 0923) Resp:  [20-30] 30 (09/12 0923) BP: (98-178)/(55-108) 106/66 mmHg (09/12 0923) SpO2:  [34 %-97 %] 97 % (09/12 0923) Weight:  [101.7 kg (224 lb 3.3 oz)] 101.7 kg (224 lb 3.3 oz) (09/11 2125)  Intake/Output from previous day: 09/11 0701 - 09/12 0700 In: 391.3 [I.V.:291.3; IV Piggyback:100] Out: 6948 [Urine:3950] Intake/Output this shift: Total I/O In: -  Out: 525 [Urine:525]  Physical Exam:  Arousable but not comminuting  Rhoncherous breathing - pt just suctioned  Perc tubes in place. Urine clear . R > L output   Lab Results:  Recent Labs  04/07/15 0500 04/08/15 0455 04/09/15 0323  HGB 9.3* 9.2* 9.7*  HCT 27.4* 27.3* 30.0*   BMET  Recent Labs  04/08/15 0455 04/09/15 0323  NA 135 142  K 4.6 4.1  CL 104 109  CO2 16* 13*  GLUCOSE 98 142*  BUN 72* 64*  CREATININE 10.69* 7.34*  CALCIUM 8.6* 8.3*    Recent Labs  04/07/15 0500 04/08/15 0455 04/09/15 0323  INR 1.41 1.28 1.37   No results for input(s): LABURIN in the last 72 hours. Results for orders placed or performed during the hospital encounter of 04/03/15  Culture, Urine     Status: None   Collection Time: 04/05/15  6:38 PM  Result Value Ref Range Status   Specimen Description URINE, CATHETERIZED  Final   Special Requests NONE  Final   Culture   Final    >=100,000 COLONIES/mL KLEBSIELLA PNEUMONIAE Confirmed Extended Spectrum Beta-Lactamase Producer (ESBL)    Report Status 04/08/2015 FINAL  Final   Organism ID, Bacteria KLEBSIELLA PNEUMONIAE  Final      Susceptibility   Klebsiella pneumoniae - MIC*    AMPICILLIN >=32 RESISTANT Resistant     CEFAZOLIN >=64 RESISTANT Resistant    CEFTRIAXONE >=64 RESISTANT Resistant     CIPROFLOXACIN >=4 RESISTANT Resistant     GENTAMICIN >=16 RESISTANT Resistant     IMIPENEM <=0.25 SENSITIVE Sensitive     NITROFURANTOIN 256 RESISTANT Resistant     TRIMETH/SULFA >=320 RESISTANT Resistant     AMPICILLIN/SULBACTAM >=32 RESISTANT Resistant     PIP/TAZO 32 INTERMEDIATE Intermediate     * >=100,000 COLONIES/mL KLEBSIELLA PNEUMONIAE  Culture, body fluid-bottle     Status: None (Preliminary result)   Collection Time: 04/07/15  9:33 AM  Result Value Ref Range Status   Specimen Description FLUID RIGHT KIDNEY  Final   Special Requests NONE  Final   Gram Stain YEAST IN BOTH AEROBIC AND ANAEROBIC BOTTLES   Final   Culture YEAST  Final   Report Status PENDING  Incomplete  Gram stain     Status: None   Collection Time: 04/07/15  9:33 AM  Result Value Ref Range Status   Specimen Description FLUID RIGHT KIDNEY  Final   Special Requests NONE  Final   Gram Stain   Final    FEW WBC PRESENT,BOTH PMN AND MONONUCLEAR FEW YEAST    Report Status 04/07/2015 FINAL  Final  Culture, body fluid-bottle     Status: None (Preliminary result)   Collection Time: 04/07/15  9:33 AM  Result Value Ref Range Status   Specimen Description FLUID KIDNEY LEFT  Final   Special Requests NONE  Final   Gram Stain   Final    YEAST HYPHAL ELEMENTS SEEN IN BOTH AEROBIC AND ANAEROBIC BOTTLES CRITICAL RESULT CALLED TO, READ BACK BY AND VERIFIED WITH: K WICKER @1128  04/07/15 MKELLY    Culture YEAST  Final   Report Status PENDING  Incomplete  Gram stain     Status: None   Collection Time: 04/07/15  9:33 AM  Result Value Ref Range Status   Specimen Description FLUID KIDNEY LEFT  Final   Special Requests NONE  Final   Gram Stain   Final    ABUNDANT WBC PRESENT,BOTH PMN AND MONONUCLEAR FEW YEAST    Report Status 04/07/2015 FINAL  Final    Studies/Results: Dg Chest Port 1 View  04/09/2015   CLINICAL DATA:  Fever, cough and shortness of breath.  EXAM: PORTABLE  CHEST - 1 VIEW  COMPARISON:  03/23/2015  FINDINGS: Right chest port remains in place, tip in the SVC. Development of right perihilar airspace opacity. Again seen elevation of right hemidiaphragm. Mild atelectasis at the left lung base. Cardiomediastinal contours are unchanged. No pulmonary edema. No pneumothorax.  IMPRESSION: Development of right perihilar airspace opacity, concerning for pneumonia. Followup PA and lateral chest X-ray is recommended in 3-4 weeks following trial of antibiotic therapy to ensure resolution and exclude underlying malignancy.   Electronically Signed   By: Jeb Levering M.D.   On: 04/09/2015 06:50    Assessment/Plan: Locally advanced bladder cancer - s/p bilateral perc tubes. Pt has taken a turn for the worse. I was glad to spend some time with him and his family. One daughter asked about his bladder cancer and why it was obstructing his kidneys. I drew them a picture of the anatomy. We also talked about the spread to the lymph nodes.    UTI - yeast from perc tubes and klebsiella in bladder. On abx and antifungals.   Appreciate excellent hospitalist care for this patient. Will follow.    LOS: 6 days   Samuel [name] 04/09/2015, 9:24 PM

## 2015-04-09 NOTE — Progress Notes (Signed)
SLP Cancellation Note  Patient Details Name: Samuel [name] MRN: [id number] DOB: [birth year]   Cancelled treatment:       Reason Eval/Treat Not Completed: Medical issues which prohibited therapy. Pt now on non-rebreather mask, continues with lethargy, not appropriate to attempt po trials.   Lanier Ensign, Student-SLP  Lanier Ensign 04/09/2015, 11:32 AM

## 2015-04-09 NOTE — Progress Notes (Addendum)
ANTICOAGULATION CONSULT NOTE - Follow Up Consult  Pharmacy Consult for heparin Indication: hx PE  Allergies  Allergen Reactions  . Tomato Other (See Comments)    Causes acid reflux    Patient Measurements: Height: 5\' 4"  (162.6 cm) Weight: 224 lb 3.3 oz (101.7 kg) IBW/kg (Calculated) : 59.2 Heparin Dosing Weight:   Vital Signs: Temp: 99.4 F (37.4 C) (09/12 0923) Temp Source: Axillary (09/12 0923) BP: 106/66 mmHg (09/12 0923) Pulse Rate: 116 (09/12 0923)  Labs:  Recent Labs  04/07/15 0500 04/07/15 1605 04/08/15 0455 04/08/15 1955 04/09/15 0323  HGB 9.3*  --  9.2*  --  9.7*  HCT 27.4*  --  27.3*  --  30.0*  PLT 112*  --  83*  --  121*  LABPROT 17.3*  --  16.1*  --  16.9*  INR 1.41  --  1.28  --  1.37  HEPARINUNFRC  --   --   --  0.28* 0.37  CREATININE 11.19* 11.12* 10.69*  --  7.34*    Estimated Creatinine Clearance: 12 mL/min (by C-G formula based on Cr of 7.34).   Medications:  Infusions:  . heparin 1,750 Units/hr (04/08/15 2136)  .  sodium bicarbonate  infusion 1000 mL      Assessment: 59 y/o paraplegic male on IV heparin while warfarin being held s/p bilateral nephrostomy tube placement. Heparin level is therapeutic at 0.37 on 1750 units/hr. No bleeding noted, Hb is stable, platelets are trending up but remain low.   Goal of Therapy:  Heparin level 0.3-0.7 units/ml Monitor platelets by anticoagulation protocol: Yes   Plan:  - Continue heparin drip at 1750 units/hr - Confirmatory heparin level now - Daily heparin level and CBC - Monitor for s/sx of bleeding - Please consult pharmacy when safe to resume warfarin  [facility], [place].D., BCPS Clinical Pharmacist Pager: [phone] 04/09/2015 11:08 AM   Addendum: Repeat heparin level is therapeutic at 0.48.  Continue with above plan.  [place], Pharm.D., BCPS Clinical Pharmacist Pager: [phone] 04/09/2015 2:04 PM
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
## 2015-04-09 NOTE — Progress Notes (Signed)
Pt found at 0245 to be tachypnic (30s) and gurgling.  Opens eyes to name.  Sats were in the 30s, bp 98/ 55, HR 130s, and temp was 102.7 axillary but we were unable to give suppository as pt has diverting colostomy.   Rapid response called.  NP notified and orders received.  Pt NT suctioned,  given 1 time Morphine 1mg  iv, placed on 100% NRB, IV Tylenol given, and labs were drawn.  Pt now RR 24, HR still 130s, bp 130/97, sats 96%, temp 102.  Will continue to monitor.

## 2015-04-09 NOTE — Progress Notes (Signed)
Both pt's daughters, [name] and [name], have now decided that pt should remain a DNR with no intubation.

## 2015-04-09 NOTE — Progress Notes (Signed)
Pt's daughter, [name], informed this RN she would to make a change in her father's code status to enable him to be intubated. Dr. Maryland Pink spoke with [name] after being informed of her decision. [name] states she will speak with her sister, [name], again, but would like to stick with this decision for now. This RN will let Dr. Maryland Pink know when [name] is here.

## 2015-04-09 NOTE — Progress Notes (Signed)
Patient ID: Samuel [name], male   DOB: 1955-10-20, 59 y.o.   MRN: [id number]  [facility] Progress Note    Assessment/ Plan:   1. Acute renal failure on chronic kidney disease stage III.With evidence of obstructive uropathy status post placement of percutaneous nephrostomy tubes and improved urinary output/alleviation of obstruction. Renal function showing improvement with suspect that he may have residual injury given chronicity of obstruction. Unfortunately, he is NOT a candidate for chronic dialysis therapy of any form given his comorbidities and his baseline quality of life 2. Hypertension/volume - improving blood pressures noted at this time, 3. Seizure - patient is a baseline seizure disorder and likely threshold lowered by multiple metabolic abnormalities/renal failure- 4. Anemia - improved status post packed red cell transfusion-monitor trend (currently without overt bleed). 5. Metabolic acidosis: Suspected to be from acute renal failure and possibly renal tubular acidosis secondary to obstruction-start hypotonic intravenous sodium bicarbonate (this will also improve his rising hypernatremia from increased urine output and restricted oral intake) 6. Urothelial CA- has portacath but apparently now determined to not be candidate for therapy  Subjective:   No acute events overnight    Objective:   BP 106/66 mmHg  Pulse 116  Temp(Src) 99.4 F (37.4 C) (Axillary)  Resp 30  Ht 5\' 4"  (1.626 m)  Wt 101.7 kg (224 lb 3.3 oz)  BMI 38.47 kg/m2  SpO2 97%  Intake/Output Summary (Last 24 hours) at 04/09/15 1050 Last data filed at 04/09/15 0109  Gross per 24 hour  Intake 351.27 ml  Output   3075 ml  Net -2723.73 ml   Weight change: -6.1 kg (-13 lb 7.2 oz)  Physical Exam: Gen: Appears to be uncomfortable resting in bed- daughter at bedside CVS: Pulse regular tachycardia, S1 and S2 normal Resp: Coarse breath sounds bilaterally-no distinct rales Abd: Soft, obese,  colostomy tube in situ Ext: 2+ right lower extremity edema-high left AKA  Imaging: Dg Chest Port 1 View  04/09/2015   CLINICAL DATA:  Fever, cough and shortness of breath.  EXAM: PORTABLE CHEST - 1 VIEW  COMPARISON:  03/23/2015  FINDINGS: Right chest port remains in place, tip in the SVC. Development of right perihilar airspace opacity. Again seen elevation of right hemidiaphragm. Mild atelectasis at the left lung base. Cardiomediastinal contours are unchanged. No pulmonary edema. No pneumothorax.  IMPRESSION: Development of right perihilar airspace opacity, concerning for pneumonia. Followup PA and lateral chest X-ray is recommended in 3-4 weeks following trial of antibiotic therapy to ensure resolution and exclude underlying malignancy.   Electronically Signed   By: Jeb Levering M.D.   On: 04/09/2015 06:50    Labs: BMET  Recent Labs Lab 04/05/15 0453 04/05/15 0455 04/06/15 0449 04/07/15 0500 04/07/15 1605 04/08/15 0455 04/09/15 0323  NA 127* 128* 128* 129* 130* 135 142  K 4.3 4.4 4.8 5.3* 4.9 4.6 4.1  CL 98* 98* 99* 101 101 104 109  CO2 17* 17* 16* 13* 16* 16* 13*  GLUCOSE 97 96 93 87 101* 98 142*  BUN 54* 54* 61* 72* 72* 72* 64*  CREATININE 8.52* 8.66* 9.76* 11.19* 11.12* 10.69* 7.34*  CALCIUM 7.7* 7.8* 8.0* 8.1* 8.1* 8.6* 8.3*  PHOS 6.5*  --  6.3*  --   --   --   --    CBC  Recent Labs Lab 04/03/15 2105  04/06/15 0449 04/07/15 0500 04/08/15 0455 04/09/15 0323  WBC 11.9*  < > 9.8 21.1* 12.5* 12.7*  NEUTROABS 10.6*  --   --   --   --   --  HGB 7.3*  < > 10.0* 9.3* 9.2* 9.7*  HCT 21.7*  < > 29.5* 27.4* 27.3* 30.0*  MCV 81.0  < > 82.2 82.0 82.7 84.0  PLT 173  < > 110* 112* 83* 121*  < > = values in this interval not displayed.  Medications:    . sodium chloride   Intravenous Once  . baclofen  5 mg Oral TID  . [START ON 05/02/2015] Cholecalciferol  50,000 Units Oral Q30 days  . fluconazole (DIFLUCAN) IV  200 mg Intravenous Q24H  . imipenem-cilastatin  250 mg  Intravenous Q12H  . lamoTRIgine  200 mg Oral BID  . metoprolol tartrate  25 mg Oral BID  . pantoprazole  40 mg Oral Daily  . polyvinyl alcohol  2 drop Both Eyes TID  . silver sulfADIAZINE   Topical BID  . sodium chloride  3 mL Intravenous Q12H  . [START ON 04/11/2015] vancomycin  1,250 mg Intravenous Q48H   Elmarie Shiley, MD 04/09/2015, 10:50 AM

## 2015-04-09 NOTE — Progress Notes (Signed)
Initial Nutrition Assessment  DOCUMENTATION CODES:   Obesity unspecified  INTERVENTION:   Diet advancement as medically appropriate.  If pt remains NPO for more than an additional 48 hours, recommend starting enteral nutrition if able. Recommend Nepro formula via tube at 25 ml/hr and increase by 10 ml every 4 hours to goal rate of 50 ml/hr to provide 2160 kcal, 97 grams of protein, and 972 ml of free water.   RD to continue to monitor.   NUTRITION DIAGNOSIS:   Inadequate oral intake related to inability to eat as evidenced by NPO status.  GOAL:   Patient will meet greater than or equal to 90% of their needs  MONITOR:   Diet advancement, Weight trends, Labs, I & O's  REASON FOR ASSESSMENT:   Low Braden    ASSESSMENT:   59 year old Caucasian male. He was discharged about a week ago after being managed for acute renal failure. He lives in a skilled nursing facility. He was sent over to the emergency department for seizure type activity. Evaluation revealed worsening in his renal function. He was hospitalized for further management. He underwent bilateral nephrostomy by interventional radiology. Per MD note, pt is not a HD candidate due to pre-existing comorbidities. Pt with metastatic bladder cancer.   Pt is currently on a non-rebreather mask and NPO. Pt nonverbal during time of visit. No family at bedside. Unable to obtain nutrition hx. Per SLP swallow evaluation yesterday, pt high aspiration risk as pt with poor mentation thus recommends strict NPO until mentation improves. If pt remains NPO for more than an additional 48 hours, recommend starting nutrition as last po intake was in 9/7 (with intake at 50-100%).   Pt with no observed significant fat or muscle mass loss.   Labs: Low CO2, calcium, GFR. High BUN, creatinine.   Diet Order:  Diet NPO time specified  Skin:  Reviewed, no issues (+1 UE, RLE edema)  Last BM:  9/11  Height:   Ht Readings from Last 1 Encounters:   04/08/15 5\' 4"  (1.626 m)    Weight:   Wt Readings from Last 1 Encounters:  04/08/15 224 lb 3.3 oz (101.7 kg)    Ideal Body Weight:  54 kg (adjusted for L AKA)  BMI:  Body mass index is 38.47 kg/(m^2).  Estimated Nutritional Needs:   Kcal:  2000-2200  Protein:  100-120 grams  Fluid:  Per MD  EDUCATION NEEDS:   No education needs identified at this time  Corrin Parker, MS, RD, LDN Pager # [phone] After hours/ weekend pager # [phone]
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
## 2015-04-10 DIAGNOSIS — J690 Pneumonitis due to inhalation of food and vomit: Secondary | ICD-10-CM | POA: Insufficient documentation

## 2015-04-10 DIAGNOSIS — J9601 Acute respiratory failure with hypoxia: Secondary | ICD-10-CM | POA: Insufficient documentation

## 2015-04-10 LAB — BASIC METABOLIC PANEL
Anion gap: 15 (ref 5–15)
BUN: 61 mg/dL — AB (ref 6–20)
CALCIUM: 8.1 mg/dL — AB (ref 8.9–10.3)
CO2: 17 mmol/L — AB (ref 22–32)
CREATININE: 5.04 mg/dL — AB (ref 0.61–1.24)
Chloride: 112 mmol/L — ABNORMAL HIGH (ref 101–111)
GFR calc non Af Amer: 12 mL/min — ABNORMAL LOW (ref >60)
GFR, EST AFRICAN AMERICAN: 13 mL/min — AB (ref >60)
GLUCOSE: 164 mg/dL — AB (ref 65–99)
Potassium: 3 mmol/L — ABNORMAL LOW (ref 3.5–5.1)
Sodium: 144 mmol/L (ref 135–145)

## 2015-04-10 LAB — CBC
HEMATOCRIT: 28.8 % — AB (ref 39.0–52.0)
Hemoglobin: 9.3 g/dL — ABNORMAL LOW (ref 13.0–17.0)
MCH: 26.7 pg (ref 26.0–34.0)
MCHC: 32.3 g/dL (ref 30.0–36.0)
MCV: 82.8 fL (ref 78.0–100.0)
Platelets: 120 10*3/uL — ABNORMAL LOW (ref 150–400)
RBC: 3.48 MIL/uL — ABNORMAL LOW (ref 4.22–5.81)
RDW: 18.1 % — AB (ref 11.5–15.5)
WBC: 11.9 10*3/uL — ABNORMAL HIGH (ref 4.0–10.5)

## 2015-04-10 LAB — PROTIME-INR
INR: 1.26 (ref 0.00–1.49)
Prothrombin Time: 16 seconds — ABNORMAL HIGH (ref 11.6–15.2)

## 2015-04-10 LAB — HEPARIN LEVEL (UNFRACTIONATED)
HEPARIN UNFRACTIONATED: 0.18 [IU]/mL — AB (ref 0.30–0.70)
Heparin Unfractionated: 0.55 IU/mL (ref 0.30–0.70)

## 2015-04-10 MED ORDER — HEPARIN BOLUS VIA INFUSION
2000.0000 [IU] | Freq: Once | INTRAVENOUS | Status: AC
  Administered 2015-04-10: 2000 [IU] via INTRAVENOUS
  Filled 2015-04-10: qty 2000

## 2015-04-10 MED ORDER — LORAZEPAM 2 MG/ML IJ SOLN
1.0000 mg | INTRAMUSCULAR | Status: DC | PRN
  Administered 2015-04-13 – 2015-04-18 (×12): 1 mg via INTRAVENOUS
  Filled 2015-04-10 (×13): qty 1

## 2015-04-10 MED ORDER — POTASSIUM CHLORIDE 10 MEQ/100ML IV SOLN
10.0000 meq | INTRAVENOUS | Status: AC
  Administered 2015-04-10 (×2): 10 meq via INTRAVENOUS
  Filled 2015-04-10 (×2): qty 100

## 2015-04-10 MED ORDER — MORPHINE SULFATE (PF) 2 MG/ML IV SOLN
2.0000 mg | INTRAVENOUS | Status: DC | PRN
  Administered 2015-04-10 – 2015-04-20 (×35): 2 mg via INTRAVENOUS
  Filled 2015-04-10 (×35): qty 1

## 2015-04-10 NOTE — Progress Notes (Signed)
Referring Physician(s): Dr. [name]  Chief Complaint:  S/P bil Percutaneous nephrostomy tube placement 04/07/15 by Dr. Earleen Newport  Subjective:  Samuel [name] is sleeping, but awakened when I entered room Did not answer any questions.  Allergies: Tomato  Medications:  Current facility-administered medications:  .  0.9 %  sodium chloride infusion, , Intravenous, Once, Bonnielee Haff, MD .  acetaminophen (TYLENOL) tablet 650 mg, 650 mg, Oral, Q6H PRN **OR** acetaminophen (TYLENOL) suppository 650 mg, 650 mg, Rectal, Q6H PRN, Jani Gravel, MD .  baclofen (LIORESAL) tablet 5 mg, 5 mg, Oral, TID, Jani Gravel, MD, 5 mg at 04/04/15 2120 .  [START ON 05/02/2015] Cholecalciferol 50,000 Units, 50,000 Units, Oral, Q30 days, Jani Gravel, MD .  fluconazole (DIFLUCAN) IVPB 200 mg, 200 mg, Intravenous, Q24H, Veronda P Bryk, RPH, 200 mg at 04/09/15 2343 .  guaiFENesin (ROBITUSSIN) 100 MG/5ML solution 300 mg, 15 mL, Oral, TID PRN, Jani Gravel, MD .  heparin ADULT infusion 100 units/mL (25000 units/250 mL), 2,100 Units/hr, Intravenous, Continuous, Veronda P Bryk, RPH, Last Rate: 21 mL/hr at 04/10/15 0530, 2,100 Units/hr at 04/10/15 0530 .  hydrALAZINE (APRESOLINE) injection 10 mg, 10 mg, Intravenous, Q6H PRN, Dianne Dun, NP, 10 mg at 04/07/15 0174 .  imipenem-cilastatin (PRIMAXIN) 250 mg in sodium chloride 0.9 % 100 mL IVPB, 250 mg, Intravenous, Q12H, Bonnielee Haff, MD, 250 mg at 04/10/15 0439 .  iohexol (OMNIPAQUE) 300 MG/ML solution 100 mL, 100 mL, Other, Once PRN, Medication Radiologist, MD, 20 mL at 04/07/15 1009 .  lamoTRIgine (LAMICTAL) tablet 200 mg, 200 mg, Oral, BID, Marliss Coots, PA-C, 200 mg at 04/05/15 2213 .  LORazepam (ATIVAN) injection 1 mg, 1 mg, Intravenous, Q4H PRN, Bonnielee Haff, MD .  metoprolol tartrate (LOPRESSOR) tablet 25 mg, 25 mg, Oral, BID, Jani Gravel, MD, 25 mg at 04/04/15 2118 .  morphine 2 MG/ML injection 2 mg, 2 mg, Intravenous, Q1H PRN, Bonnielee Haff, MD .   nitroGLYCERIN (NITROSTAT) SL tablet 0.4 mg, 0.4 mg, Sublingual, Q5 min PRN, Jani Gravel, MD .  ondansetron [facility]) injection 4 mg, 4 mg, Intravenous, Q6H PRN, Bonnielee Haff, MD .  pantoprazole (PROTONIX) EC tablet 40 mg, 40 mg, Oral, Daily, Jani Gravel, MD, 40 mg at 04/04/15 1157 .  polyvinyl alcohol (LIQUIFILM TEARS) 1.4 % ophthalmic solution 2 drop, 2 drop, Both Eyes, TID, Jani Gravel, MD, 2 drop at 04/10/15 1000 .  potassium chloride 10 mEq in 100 mL IVPB, 10 mEq, Intravenous, Q1 Hr x 2, Elmarie Shiley, MD .  silver sulfADIAZINE (SILVADENE) 1 % cream, , Topical, BID, Jani Gravel, MD .  sodium bicarbonate 75 mEq in dextrose 5 % 1,000 mL infusion, , Intravenous, Continuous, Elmarie Shiley, MD, Last Rate: 75 mL/hr at 04/09/15 1158 .  sodium chloride 0.9 % injection 10-40 mL, 10-40 mL, Intracatheter, PRN, Jani Gravel, MD, 20 mL at 04/08/15 0502 .  sodium chloride 0.9 % injection 3 mL, 3 mL, Intravenous, Q12H, Jani Gravel, MD, 3 mL at 04/06/15 1056 .  [START ON 04/11/2015] vancomycin (VANCOCIN) 1,250 mg in sodium chloride 0.9 % 250 mL IVPB, 1,250 mg, Intravenous, Q48H, Franky Macho, RPH    Vital Signs: BP 174/90 mmHg  Pulse 118  Temp(Src) 100.5 F (38.1 C) (Axillary)  Resp 22  Ht 5\' 4"  (1.626 m)  Wt 220 lb 1.6 oz (99.837 kg)  BMI 37.76 kg/m2  SpO2 98%  Physical Exam   Asleep, but arouses No abdominal pain with palpation Abdomen soft Perc neph tubes in place bilaterally, sites clean,  NT (R)side with tea colored urine, bag full (L)side with yellow urine  Imaging: Dg Chest Port 1 View  04/09/2015   CLINICAL DATA:  Fever, cough and shortness of breath.  EXAM: PORTABLE CHEST - 1 VIEW  COMPARISON:  03/23/2015  FINDINGS: Right chest port remains in place, tip in the SVC. Development of right perihilar airspace opacity. Again seen elevation of right hemidiaphragm. Mild atelectasis at the left lung base. Cardiomediastinal contours are unchanged. No pulmonary edema. No pneumothorax.  IMPRESSION: Development of  right perihilar airspace opacity, concerning for pneumonia. Followup PA and lateral chest X-ray is recommended in 3-4 weeks following trial of antibiotic therapy to ensure resolution and exclude underlying malignancy.   Electronically Signed   By: Jeb Levering M.D.   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 He has received up sizing of ureteral stents with his primary urology surgeon which has not relieved the obstruction.  He has been referred for bilateral percutaneous nephrostomy.  The patient is not responsive, and is attempting to removed himself from the fluoroscopy table upon initiation of the first attempt for percutaneous placement with moderate [place]. Anesthesia team was consulted for assistance with general endotracheal tube anesthesia and need for paralysis.  EXAM: IR NEPHROSTOMY PLACEMENT LEFT; IR NEPHROSTOMY PLACEMENT RIGHT  COMPARISON:  Ultrasound 03/23/2015, CT 02/18/2015  ANESTHESIA/SEDATION: Anesthesia team was present for general endotracheal tube anesthesia.  CONTRAST:  17mL OMNIPAQUE IOHEXOL 300 MG/ML  SOLN  MEDICATIONS: 400 mg IV Cipro  FLUOROSCOPY TIME:  1 minutes 12 seconds  TECHNIQUE: The procedure, risks, benefits, and alternatives were explained to the patient's family. Questions regarding the procedure were encouraged and answered. The patient understands and consents to the procedure.  The patient was induced with anesthesia with the anesthesia team present. He was then placed in prone position on the fluoroscopy table.  The bilateral flank was prepped with chlorhexidine in a sterile fashion, and a sterile drape was applied covering the operative field. A sterile gown and sterile gloves were used for the procedure. Local anesthesia was provided with 1% Lidocaine.  Left:  The left flank was interrogated with ultrasound and the  left kidney identified. The kidney is hydronephrotic. A suitable access site on the skin overlying the lower pole, posterior calix was identified. After local mg anesthesia was achieved, a small skin nick was made with an 11 blade scalpel. A 21 gauge Accustick needle was then advanced under direct sonographic guidance into the lower pole of the left inferior kidney. A 0.018 inch wire was advanced under fluoroscopic guidance into the left renal collecting system. The Accustick sheath was then advanced over the wire and a 0.018 system exchanged for a 0.035 system. Gentle hand injection of contrast material confirms placement of the sheath within the renal collecting system. A 10-French Cook all-purpose drain was then placed and positioned under fluoroscopic guidance. The locking loop is well formed within the left renal pelvis. The catheter was secured to the skin with 2-0 Prolene and a sterile bandage was placed. Catheter was left to gravity bag drainage.  A sample was sent for analysis.  Right:  The right flank was interrogated with ultrasound and the left kidney identified. The kidney is hydronephrotic. A suitable access site on the skin overlying the lower pole, posterior calix was identified. After local mg anesthesia was achieved, a small skin nick  was made with an 11 blade scalpel. A 21 gauge Accustick needle was then advanced under direct sonographic guidance into the lower pole of the right inferior kidney. A 0.018 inch wire was advanced under fluoroscopic guidance into the left renal collecting system. The Accustick sheath was then advanced over the wire and a 0.018 system exchanged for a 0.035 system. Gentle hand injection of contrast material confirms placement of the sheath within the renal collecting system. A 10-French Cook all-purpose drain was then placed and positioned under fluoroscopic guidance. The locking loop is well formed within the left renal pelvis. The catheter was secured to the skin with  2-0 Prolene and a sterile bandage was placed. Catheter was left to gravity bag drainage.  A sample was sent for analysis.  Patient remained hemodynamically stable throughout.  No complications were encountered.  COMPLICATIONS: None  FINDINGS: Ultrasound survey demonstrates hydronephrosis of the bilateral kidneys.  Ten French percutaneous nephrostomy tube were well positioned within the left and subsequently right kidneys, as above.  IMPRESSION: Status post placement of bilateral percutaneous nephrostomy tube.  A sample was sent to the lab for analysis from each of the kidneys.  Signed,  Dulcy Fanny. Earleen Newport, DO  [facility]  [facility]   Electronically Signed   By: Corrie Mckusick D.O.   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 He has received up sizing of ureteral stents with his primary urology surgeon which has not relieved the obstruction.  He has been referred for bilateral percutaneous nephrostomy.  The patient is not responsive, and is attempting to removed himself from the fluoroscopy table upon initiation of the first attempt for percutaneous placement with moderate [place]. Anesthesia team was consulted for assistance with general endotracheal tube anesthesia and need for paralysis.  EXAM: IR NEPHROSTOMY PLACEMENT LEFT; IR NEPHROSTOMY PLACEMENT RIGHT  COMPARISON:  Ultrasound 03/23/2015, CT 02/18/2015  ANESTHESIA/SEDATION: Anesthesia team was present for general endotracheal tube anesthesia.  CONTRAST:  56mL OMNIPAQUE IOHEXOL 300 MG/ML  SOLN  MEDICATIONS: 400 mg IV Cipro  FLUOROSCOPY TIME:  1 minutes 12 seconds  TECHNIQUE: The procedure, risks, benefits, and alternatives were explained to the patient's family. Questions regarding the procedure were encouraged and answered. The patient understands and consents to  the procedure.  The patient was induced with anesthesia with the anesthesia team present. He was then placed in prone position on the fluoroscopy table.  The bilateral flank was prepped with chlorhexidine in a sterile fashion, and a sterile drape was applied covering the operative field. A sterile gown and sterile gloves were used for the procedure. Local anesthesia was provided with 1% Lidocaine.  Left:  The left flank was interrogated with ultrasound and the left kidney identified. The kidney is hydronephrotic. A suitable access site on the skin overlying the lower pole, posterior calix was identified. After local mg anesthesia was achieved, a small skin nick was made with an 11 blade scalpel. A 21 gauge Accustick needle was then advanced under direct sonographic guidance into the lower pole of the left inferior kidney. A 0.018 inch wire was advanced under fluoroscopic guidance into the left renal collecting system. The Accustick sheath was then advanced over the wire and a 0.018 system exchanged for a 0.035 system. Gentle hand injection of contrast material confirms placement of the sheath within the renal collecting system. A  10-French Cook all-purpose drain was then placed and positioned under fluoroscopic guidance. The locking loop is well formed within the left renal pelvis. The catheter was secured to the skin with 2-0 Prolene and a sterile bandage was placed. Catheter was left to gravity bag drainage.  A sample was sent for analysis.  Right:  The right flank was interrogated with ultrasound and the left kidney identified. The kidney is hydronephrotic. A suitable access site on the skin overlying the lower pole, posterior calix was identified. After local mg anesthesia was achieved, a small skin nick was made with an 11 blade scalpel. A 21 gauge Accustick needle was then advanced under direct sonographic guidance into the lower pole of the right inferior kidney. A 0.018 inch wire was advanced under  fluoroscopic guidance into the left renal collecting system. The Accustick sheath was then advanced over the wire and a 0.018 system exchanged for a 0.035 system. Gentle hand injection of contrast material confirms placement of the sheath within the renal collecting system. A 10-French Cook all-purpose drain was then placed and positioned under fluoroscopic guidance. The locking loop is well formed within the left renal pelvis. The catheter was secured to the skin with 2-0 Prolene and a sterile bandage was placed. Catheter was left to gravity bag drainage.  A sample was sent for analysis.  Patient remained hemodynamically stable throughout.  No complications were encountered.  COMPLICATIONS: None  FINDINGS: Ultrasound survey demonstrates hydronephrosis of the bilateral kidneys.  Ten French percutaneous nephrostomy tube were well positioned within the left and subsequently right kidneys, as above.  IMPRESSION: Status post placement of bilateral percutaneous nephrostomy tube.  A sample was sent to the lab for analysis from each of the kidneys.  Signed,  Dulcy Fanny. Earleen Newport, DO  [facility]  [facility]   Electronically Signed   By: Corrie Mckusick D.O.   On: 04/07/2015 10:16    Labs:  CBC:  Recent Labs  04/07/15 0500 04/08/15 0455 04/09/15 0323 04/10/15 0412  WBC 21.1* 12.5* 12.7* 11.9*  HGB 9.3* 9.2* 9.7* 9.3*  HCT 27.4* 27.3* 30.0* 28.8*  PLT 112* 83* 121* 120*    COAGS:  Recent Labs  09/26/14 1055  11/10/14 0855 11/28/14 1330  02/18/15 0025  04/07/15 0500 04/08/15 0455 04/09/15 0323 04/10/15 0412  INR 1.14  < > 1.21 1.21  < > 3.54*  < > 1.41 1.28 1.37 1.26  APTT 32  --  27 37  --  52*  --   --   --   --   --   < > = values in this interval not displayed.  BMP:  Recent Labs  04/07/15 1605 04/08/15 0455 04/09/15 0323 04/10/15 0412  NA 130* 135 142 144  K 4.9 4.6 4.1 3.0*  CL 101 104 109 112*  CO2 16* 16* 13* 17*  GLUCOSE 101* 98  142* 164*  BUN 72* 72* 64* 61*  CALCIUM 8.1* 8.6* 8.3* 8.1*  CREATININE 11.12* 10.69* 7.34* 5.04*  GFRNONAA 4* 5* 7* 12*  GFRAA 5* 5* 8* 13*    LIVER FUNCTION TESTS:  Recent Labs  03/25/15 1149 03/26/15 0400 04/03/15 2105 04/04/15 0225 04/05/15 0453 04/06/15 0449  BILITOT 0.2* 0.4 0.1* 0.5  --   --   AST 16 12* 12* 12*  --   --   ALT 8* 7* 8* 8*  --   --   ALKPHOS 75 68 97 92  --   --   PROT  6.5 6.5 7.1 6.8  --   --   ALBUMIN 2.1* 2.0* 2.4* 2.3* 2.1* 2.3*    Assessment and Plan:  S/P Bilateral percutaneous nephrostomy tube placement 04/07/15 by Dr. Lavena Bullion output from both Cr trending down  Signed: Ascencion Dike PA-C 04/10/2015, 10:49 AM   I spent a total of 15 Minutes at the the patient's bedside AND on the patient's hospital floor or unit, greater than 50% of which was counseling/coordinating care for bil perc nephrostomy tubes.
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
## 2015-04-10 NOTE — Progress Notes (Signed)
ANTICOAGULATION CONSULT NOTE - Follow Up Consult  Pharmacy Consult for heparin Indication: h/o PE   Labs:  Recent Labs  04/08/15 0455  04/09/15 0323 04/09/15 1240 04/10/15 0412  HGB 9.2*  --  9.7*  --  9.3*  HCT 27.3*  --  30.0*  --  28.8*  PLT 83*  --  121*  --  120*  LABPROT 16.1*  --  16.9*  --  16.0*  INR 1.28  --  1.37  --  1.26  HEPARINUNFRC  --   < > 0.37 0.48 0.18*  CREATININE 10.69*  --  7.34*  --  5.04*  < > = values in this interval not displayed.   Assessment: 58yo male subtherapeutic on heparin after two levels at goal, no gtt issues overnight.  Goal of Therapy:  Heparin level 0.3-0.7 units/ml   Plan:  Will rebolus with heparin 2000 units x1 and increase gtt by 3 units/kg/hr to 2100 units/hr and check level in [place], PharmD, BCPS  04/10/2015,5:25 AM

## 2015-04-10 NOTE — Care Management Important Message (Signed)
Important Message  Patient Details  Name: Samuel [name] MRN: [id number] Date of Birth: 1955-11-21   Medicare Important Message Given:  Yes-fourth notification given    RoyalRory Percy, RN 04/10/2015, 10:50 AM

## 2015-04-10 NOTE — Progress Notes (Signed)
ANTICOAGULATION CONSULT NOTE - Follow Up Consult  Pharmacy Consult for heparin Indication: hx PE  Allergies  Allergen Reactions  . Tomato Other (See Comments)    Causes acid reflux    Patient Measurements: Height: 5\' 4"  (162.6 cm) Weight: 220 lb 1.6 oz (99.837 kg) IBW/kg (Calculated) : 59.2 Heparin Dosing Weight:   Vital Signs: Temp: 100.5 F (38.1 C) (09/13 0925) Temp Source: Axillary (09/13 0925) BP: 174/90 mmHg (09/13 0925) Pulse Rate: 118 (09/13 0925)  Labs:  Recent Labs  04/08/15 0455  04/09/15 0323 04/09/15 1240 04/10/15 0412 04/10/15 1420  HGB 9.2*  --  9.7*  --  9.3*  --   HCT 27.3*  --  30.0*  --  28.8*  --   PLT 83*  --  121*  --  120*  --   LABPROT 16.1*  --  16.9*  --  16.0*  --   INR 1.28  --  1.37  --  1.26  --   HEPARINUNFRC  --   < > 0.37 0.48 0.18* 0.55  CREATININE 10.69*  --  7.34*  --  5.04*  --   < > = values in this interval not displayed.  Estimated Creatinine Clearance: 17.2 mL/min (by C-G formula based on Cr of 5.04).   Medications:  Infusions:  . heparin 2,100 Units/hr (04/10/15 0530)  .  sodium bicarbonate  infusion 1000 mL 75 mL/hr at 04/09/15 1158    Assessment: 59 y/o paraplegic male on IV heparin while warfarin being held s/p bilateral nephrostomy tube placement. Heparin level is therapeutic at 0.55 on 2100 units/hr.   Goal of Therapy:  Heparin level 0.3-0.7 units/ml Monitor platelets by anticoagulation protocol: Yes   Plan:  - Continue heparin drip at 2100 units/hr - Daily heparin level and CBC - Monitor for s/sx of bleeding  Levester Fresh, PharmD, BCPS Clinical Pharmacist Pager [phone] 04/10/2015 3:31 PM

## 2015-04-10 NOTE — Progress Notes (Addendum)
Speech Language Pathology Treatment: Dysphagia  Patient Details Name: Samuel [name] MRN: [id number] DOB: 02/10/1956 Today's Date: 04/10/2015 Time: 4970-2637 SLP Time Calculation (min) (ACUTE ONLY): 13 min  Assessment / Plan / Recommendation Clinical Impression  Pt see for potential initiation of po's. Chart reviewed and spoke with RN prior to session. Pt on non rebreather mask, drowsy, however awake and arousable, following simple directions with repetition additional time provided. Following oral care pt consumed separate trials ice chip and thin liquid via teaspoon. Oral transit delay, suspected delayed swallow initiation. No cough/throat clear however pt at high aspiration risk. He seemed very pleased to have ice and water. Did not attempt applesauce at this time and pt also refused. Given progress notes indicating poor prognosis, recommend oral care followed by ice and teaspoon sips water (separately) when desired for oral moisture and comfort.  Will sign off at this time. Please reconsult if needed.  HPI Other Pertinent Information: 59 year old male, SNF resident,  admitted following seizure like activity. Diagnosed with with acute on chronic renal failure. PMH of HTN, paraplegia following spinal cord injury, bipolar d/o, seizure d/o, GERD, alcohol abuse, PNA, polysubstance abuse, hospitalization 1 month prior for PNA and UTI. S/p bilateral percutaneous nephrostomy tube placement 9/10.     Pertinent Vitals Pain Assessment: Faces Faces Pain Scale: Hurts even more  SLP Plan  Discharge SLP treatment due to (comment)    Recommendations Diet recommendations: Thin liquid (ice chips) Liquids provided via: Teaspoon Medication Administration: Via alternative means Supervision: Staff to assist with self feeding;Full supervision/cueing for compensatory strategies Compensations: Slow rate;Small sips/bites;Check for anterior loss Postural Changes and/or Swallow Maneuvers: Seated upright 90 degrees               Oral Care Recommendations: Oral care QID Follow up Recommendations: None Plan: Discharge SLP treatment due to (comment)    GO     Houston Siren 04/10/2015, 1:33 PM   Orbie Pyo Colvin Caroli.Ed [employer] [phone]

## 2015-04-10 NOTE — Progress Notes (Signed)
Patient ID: Samuel [name], male   DOB: 1956/01/16, 59 y.o.   MRN: [id number]  [facility] Progress Note    Assessment/ Plan:   1. Acute renal failure on chronic kidney disease stage III.With evidence of obstructive uropathy status post placement of percutaneous nephrostomy tubes and improved urinary output/alleviation of obstruction. Renal function continues to show slow but gradual improvement-still far from his baseline renal function but general trend of creatinine heading towards improvement. Unfortunately, he is NOT a candidate for chronic dialysis therapy of any form given his comorbidities and his baseline quality of life.  2. Hypertension/volume - acceptable blood pressures/volume status, 3. Seizure - patient is a baseline seizure disorder and likely threshold lowered by multiple metabolic abnormalities/renal failure. No seizure overnight.  4. Anemia - improved status post packed red cell transfusion-monitor trend (currently without overt bleed). 5. Metabolic acidosis: Improving on hypotonic sodium bicarbonate drip  6. Urothelial CA- has portacath but apparently now determined to not be candidate for therapy-Discussions with the family overnight and now remains DO NOT RESUSCITATE. 7. Hypokalemia: Likely secondary to urine output and sodium bicarbonate drip-will supplement intravenous potassium (he is NPO due to aspiration risk)  Subjective:   No acute events overnight -discussion/nodes noted from urology and nursing   Objective:   BP 146/88 mmHg  Pulse 117  Temp(Src) 99.6 F (37.6 C) (Axillary)  Resp 22  Ht 5\' 4"  (1.626 m)  Wt 99.837 kg (220 lb 1.6 oz)  BMI 37.76 kg/m2  SpO2 97%  Intake/Output Summary (Last 24 hours) at 04/10/15 1028 Last data filed at 04/10/15 1660  Gross per 24 hour  Intake 2957.5 ml  Output    525 ml  Net 2432.5 ml   Weight change: -1.863 kg (-4 lb 1.7 oz)  Physical Exam: Gen: Appears to be uncomfortable resting in bed- daughter  at bedside CVS: Pulse regular tachycardia, S1 and S2 normal Resp: Coarse breath sounds bilaterally-no distinct rales Abd: Soft, obese, colostomy tube in situ Ext: 2+ right lower extremity edema-high left AKA  Imaging: Dg Chest Port 1 View  04/09/2015   CLINICAL DATA:  Fever, cough and shortness of breath.  EXAM: PORTABLE CHEST - 1 VIEW  COMPARISON:  03/23/2015  FINDINGS: Right chest port remains in place, tip in the SVC. Development of right perihilar airspace opacity. Again seen elevation of right hemidiaphragm. Mild atelectasis at the left lung base. Cardiomediastinal contours are unchanged. No pulmonary edema. No pneumothorax.  IMPRESSION: Development of right perihilar airspace opacity, concerning for pneumonia. Followup PA and lateral chest X-ray is recommended in 3-4 weeks following trial of antibiotic therapy to ensure resolution and exclude underlying malignancy.   Electronically Signed   By: Jeb Levering M.D.   On: 04/09/2015 06:50    Labs: BMET  Recent Labs Lab 04/05/15 0453 04/05/15 0455 04/06/15 0449 04/07/15 0500 04/07/15 1605 04/08/15 0455 04/09/15 0323 04/10/15 0412  NA 127* 128* 128* 129* 130* 135 142 144  K 4.3 4.4 4.8 5.3* 4.9 4.6 4.1 3.0*  CL 98* 98* 99* 101 101 104 109 112*  CO2 17* 17* 16* 13* 16* 16* 13* 17*  GLUCOSE 97 96 93 87 101* 98 142* 164*  BUN 54* 54* 61* 72* 72* 72* 64* 61*  CREATININE 8.52* 8.66* 9.76* 11.19* 11.12* 10.69* 7.34* 5.04*  CALCIUM 7.7* 7.8* 8.0* 8.1* 8.1* 8.6* 8.3* 8.1*  PHOS 6.5*  --  6.3*  --   --   --   --   --    CBC  Recent  Labs Lab 04/03/15 2105  04/07/15 0500 04/08/15 0455 04/09/15 0323 04/10/15 0412  WBC 11.9*  < > 21.1* 12.5* 12.7* 11.9*  NEUTROABS 10.6*  --   --   --   --   --   HGB 7.3*  < > 9.3* 9.2* 9.7* 9.3*  HCT 21.7*  < > 27.4* 27.3* 30.0* 28.8*  MCV 81.0  < > 82.0 82.7 84.0 82.8  PLT 173  < > 112* 83* 121* 120*  < > = values in this interval not displayed.  Medications:    . sodium chloride    Intravenous Once  . baclofen  5 mg Oral TID  . [START ON 05/02/2015] Cholecalciferol  50,000 Units Oral Q30 days  . fluconazole (DIFLUCAN) IV  200 mg Intravenous Q24H  . imipenem-cilastatin  250 mg Intravenous Q12H  . lamoTRIgine  200 mg Oral BID  . metoprolol tartrate  25 mg Oral BID  . pantoprazole  40 mg Oral Daily  . polyvinyl alcohol  2 drop Both Eyes TID  . silver sulfADIAZINE   Topical BID  . sodium chloride  3 mL Intravenous Q12H  . [START ON 04/11/2015] vancomycin  1,250 mg Intravenous Q48H   Elmarie Shiley, MD 04/10/2015, 10:28 AM

## 2015-04-10 NOTE — Clinical Social Work Note (Signed)
Patient is from [facility] facility. CSW is continuing to follow patient's progress and will assist if needed with d/c disposition.  Cesia Orf Givens, MSW, LCSW Licensed Clinical Social Worker [place] [phone]

## 2015-04-11 LAB — BASIC METABOLIC PANEL
ANION GAP: 12 (ref 5–15)
BUN: 52 mg/dL — ABNORMAL HIGH (ref 6–20)
CALCIUM: 7.7 mg/dL — AB (ref 8.9–10.3)
CHLORIDE: 109 mmol/L (ref 101–111)
CO2: 23 mmol/L (ref 22–32)
Creatinine, Ser: 3.49 mg/dL — ABNORMAL HIGH (ref 0.61–1.24)
GFR calc non Af Amer: 18 mL/min — ABNORMAL LOW (ref >60)
GFR, EST AFRICAN AMERICAN: 21 mL/min — AB (ref >60)
GLUCOSE: 135 mg/dL — AB (ref 65–99)
POTASSIUM: 2.5 mmol/L — AB (ref 3.5–5.1)
Sodium: 144 mmol/L (ref 135–145)

## 2015-04-11 LAB — CBC
HEMATOCRIT: 25.1 % — AB (ref 39.0–52.0)
HEMOGLOBIN: 8.1 g/dL — AB (ref 13.0–17.0)
MCH: 26.6 pg (ref 26.0–34.0)
MCHC: 32.3 g/dL (ref 30.0–36.0)
MCV: 82.6 fL (ref 78.0–100.0)
Platelets: 112 10*3/uL — ABNORMAL LOW (ref 150–400)
RBC: 3.04 MIL/uL — AB (ref 4.22–5.81)
RDW: 18.1 % — ABNORMAL HIGH (ref 11.5–15.5)
WBC: 11.6 10*3/uL — AB (ref 4.0–10.5)

## 2015-04-11 LAB — CULTURE, BODY FLUID-BOTTLE

## 2015-04-11 LAB — HEPARIN LEVEL (UNFRACTIONATED): HEPARIN UNFRACTIONATED: 0.42 [IU]/mL (ref 0.30–0.70)

## 2015-04-11 LAB — CULTURE, BODY FLUID W GRAM STAIN -BOTTLE

## 2015-04-11 LAB — PROTIME-INR
INR: 1.38 (ref 0.00–1.49)
Prothrombin Time: 17.1 seconds — ABNORMAL HIGH (ref 11.6–15.2)

## 2015-04-11 MED ORDER — SODIUM CHLORIDE 0.9 % IV SOLN
250.0000 mg | Freq: Four times a day (QID) | INTRAVENOUS | Status: DC
  Administered 2015-04-11 – 2015-04-14 (×12): 250 mg via INTRAVENOUS
  Filled 2015-04-11 (×16): qty 250

## 2015-04-11 MED ORDER — POTASSIUM CHLORIDE 10 MEQ/100ML IV SOLN
10.0000 meq | INTRAVENOUS | Status: AC
  Administered 2015-04-11 (×3): 10 meq via INTRAVENOUS
  Filled 2015-04-11 (×6): qty 100

## 2015-04-11 MED ORDER — POTASSIUM CHLORIDE 10 MEQ/100ML IV SOLN
10.0000 meq | INTRAVENOUS | Status: AC
  Administered 2015-04-11 – 2015-04-12 (×3): 10 meq via INTRAVENOUS
  Filled 2015-04-11 (×3): qty 100

## 2015-04-11 MED ORDER — RESOURCE THICKENUP CLEAR PO POWD
ORAL | Status: DC | PRN
  Filled 2015-04-11: qty 125
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
## 2015-04-11 NOTE — Progress Notes (Addendum)
Patient ID: Samuel [name], male   DOB: February 23, 1956, 59 y.o.   MRN: [id number]  [facility] Progress Note    Assessment/ Plan:   1. Acute renal failure on chronic kidney disease stage III.With evidence of obstructive uropathy status post placement of percutaneous nephrostomy tubes and improved urinary output/alleviation of obstruction. Continues to show improvement of renal function and heading towards his baseline. Unfortunately, he is NOT a candidate for chronic dialysis therapy of any form given his comorbidities and his baseline quality of life.  2. Hypertension/volume - acceptable blood pressures/volume status, 3. Seizure - patient is a baseline seizure disorder and likely threshold lowered by multiple metabolic abnormalities/renal failure. No seizure overnight.  4. Anemia - improved status post packed red cell transfusion-monitor trend (currently without overt bleed). 5. Metabolic acidosis: Improving on hypotonic sodium bicarbonate drip  6. Urothelial CA- has portacath but apparently now determined to not be candidate for therapy-Discussions with the family overnight and now remains DO NOT RESUSCITATE. 7. Hypokalemia: We'll supplement intravenously-hypokalemic from limited intake and ongoing urine output  We'll sign off at this time-please reconsult/call if we can assist further  Subjective:   No acute events overnight-awaiting placement    Objective:   BP 182/99 mmHg  Pulse 99  Temp(Src) 98.6 F (37 C) (Axillary)  Resp 20  Ht 5\' 4"  (1.626 m)  Wt 99.837 kg (220 lb 1.6 oz)  BMI 37.76 kg/m2  SpO2 98%  Intake/Output Summary (Last 24 hours) at 04/11/15 1103 Last data filed at 04/11/15 0900  Gross per 24 hour  Intake   2253 ml  Output      0 ml  Net   2253 ml   Weight change:   Physical Exam: Gen: Appears to be comfortable resting in bed-no verbal responses CVS: Pulse regular tachycardia, S1 and S2 normal Resp: Coarse breath sounds bilaterally-no  distinct rales Abd: Soft, obese, colostomy tube in situ Ext: 1+ right lower extremity edema-high left AKA  Imaging: No results found.  Labs: BMET  Recent Labs Lab 04/05/15 0453  04/06/15 0449 04/07/15 0500 04/07/15 1605 04/08/15 0455 04/09/15 0323 04/10/15 0412 04/11/15 0526  NA 127*  < > 128* 129* 130* 135 142 144 144  K 4.3  < > 4.8 5.3* 4.9 4.6 4.1 3.0* 2.5*  CL 98*  < > 99* 101 101 104 109 112* 109  CO2 17*  < > 16* 13* 16* 16* 13* 17* 23  GLUCOSE 97  < > 93 87 101* 98 142* 164* 135*  BUN 54*  < > 61* 72* 72* 72* 64* 61* 52*  CREATININE 8.52*  < > 9.76* 11.19* 11.12* 10.69* 7.34* 5.04* 3.49*  CALCIUM 7.7*  < > 8.0* 8.1* 8.1* 8.6* 8.3* 8.1* 7.7*  PHOS 6.5*  --  6.3*  --   --   --   --   --   --   < > = values in this interval not displayed. CBC  Recent Labs Lab 04/08/15 0455 04/09/15 0323 04/10/15 0412 04/11/15 0526  WBC 12.5* 12.7* 11.9* 11.6*  HGB 9.2* 9.7* 9.3* 8.1*  HCT 27.3* 30.0* 28.8* 25.1*  MCV 82.7 84.0 82.8 82.6  PLT 83* 121* 120* 112*    Medications:    . sodium chloride   Intravenous Once  . baclofen  5 mg Oral TID  . [START ON 05/02/2015] Cholecalciferol  50,000 Units Oral Q30 days  . fluconazole (DIFLUCAN) IV  200 mg Intravenous Q24H  . imipenem-cilastatin  250 mg Intravenous Q12H  .  lamoTRIgine  200 mg Oral BID  . metoprolol tartrate  25 mg Oral BID  . pantoprazole  40 mg Oral Daily  . polyvinyl alcohol  2 drop Both Eyes TID  . potassium chloride  10 mEq Intravenous Q1 Hr x 6  . silver sulfADIAZINE   Topical BID  . sodium chloride  3 mL Intravenous Q12H  . vancomycin  1,250 mg Intravenous Q48H   Elmarie Shiley, MD 04/11/2015, 11:03 AM

## 2015-04-11 NOTE — Progress Notes (Signed)
ANTICOAGULATION & ANTIBIOTIC CONSULT NOTE - Follow Up Consult  Pharmacy Consult for Heparin & Vancomycin + Fluconazole + Primaxin Indication: Hx PE (while warfarin on hold) & HCAP/UTI/infected nephrostomy  Allergies  Allergen Reactions  . Tomato Other (See Comments)    Causes acid reflux    Patient Measurements: Height: 5\' 4"  (162.6 cm) Weight: 220 lb 1.6 oz (99.837 kg) IBW/kg (Calculated) : 59.2 Heparin Dosing Weight: 82 kg  Vital Signs: Temp: 98.6 F (37 C) (09/14 1000) Temp Source: Axillary (09/14 1000) BP: 182/99 mmHg (09/14 1000) Pulse Rate: 99 (09/14 1000)  Labs:  Recent Labs  04/09/15 0323  04/10/15 0412 04/10/15 1420 04/11/15 0526  HGB 9.7*  --  9.3*  --  8.1*  HCT 30.0*  --  28.8*  --  25.1*  PLT 121*  --  120*  --  112*  LABPROT 16.9*  --  16.0*  --  17.1*  INR 1.37  --  1.26  --  1.38  HEPARINUNFRC 0.37  < > 0.18* 0.55 0.42  CREATININE 7.34*  --  5.04*  --  3.49*  < > = values in this interval not displayed.  Estimated Creatinine Clearance: 24.9 mL/min (by C-G formula based on Cr of 3.49).   Medications:  Heparin @ 2100 units/hr (21 ml/hr) Fluconazole 200 mg IV every 24 hours Primaxin 250 mg IV every 12 hours Vancomycin 1250 mg IV every 48 hours  Assessment: 23 YOF who continues on heparin while warfarin is on hold for hx PE. Heparin level this morning remains therapeutic (HL 0.42 << 0.55, goal of 0.3-0.7). Hgb/Hct/Plt slight drop - no overt s/sx of bleeding noted.  The patient also continues on Vancomycin + Primaxin for HCAP. Primaxin is also covering for ESBL Klebsiella PNA UTI. Fluconazole is covering for candida tropicalis cultured from the nephrostomy fluid. Renal function is noted to be improving after the placement of nephrostomy tubes. SCr 3.49, CrCl~20-30 ml/min.   Goal of Therapy:  Heparin level 0.3-0.7 units/ml   Plan:  1. Continue Heparin at 2100 units/hr (21 ml/hr) 2. Continue Vancomycin 1250 mg IV every 48 hours 3. Adjust Primaxin  to 250 mg IV every 6 hours 3. Continue Fluconazole 200 mg IV every 24 hours 4. Will continue to monitor for any signs/symptoms of bleeding and will follow up with heparin level in the a.m.  5. Will continue to follow renal function, culture results, LOT, and antibiotic de-escalation plans   Alycia Rossetti, PharmD, BCPS Clinical Pharmacist Pager: [phone] 04/11/2015 11:45 AM

## 2015-04-11 NOTE — Progress Notes (Signed)
  Pt alert this evening. He called out when I walked by his room initially.  Filed Vitals:   04/11/15 2013  BP: 184/95  Pulse: 103  Temp: 99.2 F (37.3 C)  Resp: 18    Intake/Output Summary (Last 24 hours) at 04/11/15 2155 Last data filed at 04/11/15 2054  Gross per 24 hour  Intake    123 ml  Output   1875 ml  Net  -1752 ml    PE: Pt alert, knows I'm his "doctor" Perc tubes with good UOP Urine in foley now, too. Clear.  IMP/Plan - no new recs. Pt with amazing improvement. Appreciate great hospitalist care.  Will follow.

## 2015-04-11 NOTE — Progress Notes (Signed)
Referring Physician(s): [place]  Chief Complaint:  B Hydro  Subjective:  Bladder ca obstructive hydronephrosis B PCNs placed 9/10 Output good  Allergies: Tomato  Medications: Prior to Admission medications   Medication Sig Start Date End Date Taking? Authorizing Provider  acetaminophen (TYLENOL) 325 MG tablet Take 650 mg by mouth every 6 (six) hours as needed for moderate pain.   Yes Historical Provider, MD  alum & mag hydroxide-simeth (MAALOX PLUS) 400-400-40 MG/5ML suspension Take 20 mLs by mouth every 6 (six) hours as needed for indigestion.   Yes Historical Provider, MD  amLODipine (NORVASC) 5 MG tablet Take 1 tablet (5 mg total) by mouth daily. 03/27/15  Yes Bonnielee Haff, MD  atorvastatin (LIPITOR) 10 MG tablet Take 10 mg by mouth daily at 6 PM.   Yes Historical Provider, MD  baclofen (LIORESAL) 10 MG tablet Take 0.5 tablets (5 mg total) by mouth 3 (three) times daily. 03/27/15  Yes Bonnielee Haff, MD  Cholecalciferol 50000 UNITS capsule Take 50,000 Units by mouth every 30 (thirty) days.   Yes Historical Provider, MD  clonazePAM (KLONOPIN) 0.5 MG tablet Take 1/2 tablet by mouth twice daily 03/27/15  Yes Bonnielee Haff, MD  cyanocobalamin 1000 MCG tablet Take 100 mcg by mouth daily.    Yes Historical Provider, MD  docusate sodium (COLACE) 100 MG capsule Take 100 mg by mouth 2 (two) times daily.   Yes Historical Provider, MD  famotidine (PEPCID) 20 MG tablet Take 20 mg by mouth at bedtime.   Yes Historical Provider, MD  ferrous sulfate 325 (65 FE) MG EC tablet Take 325 mg by mouth 2 (two) times daily.   Yes Historical Provider, MD  folic acid (FOLVITE) 1 MG tablet Take 1 mg by mouth daily.   Yes Historical Provider, MD  guaiFENesin (ROBITUSSIN) 100 MG/5ML SOLN Take 15 mLs by mouth 3 (three) times daily as needed for cough or to loosen phlegm.   Yes Historical Provider, MD  lactose free nutrition (BOOST) LIQD Take 237 mLs by mouth 2 (two) times daily between meals.   Yes  Historical Provider, MD  lactulose (CHRONULAC) 10 GM/15ML solution Take 20 g by mouth 4 (four) times daily.   Yes Historical Provider, MD  lamoTRIgine (LAMICTAL) 100 MG tablet Take 300 mg by mouth 2 (two) times daily.   Yes Historical Provider, MD  lidocaine (LIDODERM) 5 % Place 2 patches onto the skin as needed (for pain). Remove & Discard patch within 12 hours or as directed by MD (apply 2 patches to left stump daily at 9pm   Yes Historical Provider, MD  linezolid (ZYVOX) 600 MG tablet Take 1 tablet (600 mg total) by mouth every 12 (twelve) hours. For 10 more days Patient taking differently: Take 600 mg by mouth every 12 (twelve) hours. Started therapy unknown. Finish therapy on 04-07-15 per [facility] from [facility] 03/27/15  Yes Bonnielee Haff, MD  metoprolol tartrate (LOPRESSOR) 25 MG tablet Take 1 tablet (25 mg total) by mouth 2 (two) times daily. 09/28/14  Yes Orson Eva, MD  omeprazole (PRILOSEC) 20 MG capsule Take 20 mg by mouth daily.   Yes Historical Provider, MD  ondansetron (ZOFRAN) 4 MG tablet Take 4 mg by mouth every 8 (eight) hours as needed for nausea or vomiting.   Yes Historical Provider, MD  oxybutynin (DITROPAN-XL) 5 MG 24 hr tablet Take 1 tablet (5 mg total) by mouth every morning. 03/27/15  Yes Bonnielee Haff, MD  Polyethyl Glycol-Propyl Glycol (SYSTANE) 0.4-0.3 % SOLN Apply 2 drops to  eye 3 (three) times daily.   Yes Historical Provider, MD  pregabalin (LYRICA) 25 MG capsule Take 25 mg by mouth 3 (three) times daily.   Yes Historical Provider, MD  promethazine (PHENERGAN) 25 MG tablet Take 25 mg by mouth every 8 (eight) hours as needed for nausea or vomiting.   Yes Historical Provider, MD  promethazine (PHENERGAN) 25 MG/ML injection Inject 25 mg into the muscle every 4 (four) hours as needed for nausea or vomiting.   Yes Historical Provider, MD  QUEtiapine (SEROQUEL) 300 MG tablet Take 300 mg by mouth at bedtime.   Yes Historical Provider, MD  senna (SENOKOT) 8.6 MG tablet Take 2 tablets  by mouth every morning.   Yes Historical Provider, MD  silver sulfADIAZINE (SILVADENE) 1 % cream Apply topically 2 (two) times daily. 03/27/15  Yes Bonnielee Haff, MD  traZODone (DESYREL) 50 MG tablet Take 1 tablet (50 mg total) by mouth at bedtime. 03/27/15  Yes Bonnielee Haff, MD  warfarin (COUMADIN) 2.5 MG tablet Take 2.5 mg by mouth daily.   Yes Historical Provider, MD  lamoTRIgine (LAMICTAL) 200 MG tablet Take 0.5 tablets (100 mg total) by mouth 2 (two) times daily. Patient not taking: Reported on 04/03/2015 02/22/15   Nishant Dhungel, MD  nitroGLYCERIN (NITROSTAT) 0.4 MG SL tablet Place 0.4 mg under the tongue every 5 (five) minutes as needed for chest pain.    Historical Provider, MD  oxyCODONE (OXY IR/ROXICODONE) 5 MG immediate release tablet Take 1 tablet (5 mg total) by mouth every 4 (four) hours as needed for moderate pain or severe pain. Patient not taking: Reported on 04/03/2015 03/27/15   Bonnielee Haff, MD     Vital Signs: BP 182/99 mmHg  Pulse 99  Temp(Src) 98.6 F (37 C) (Axillary)  Resp 20  Ht 5\' 4"  (1.626 m)  Wt 220 lb 1.6 oz (99.837 kg)  BMI 37.76 kg/m2  SpO2 99%  Physical Exam  Abdominal: Soft.  Skin: Skin is warm.  B PCN sites clean and dry  Output Rt 325 cc yesterday 30 cc in bag; reddish brown fluid  Output Lt 325 cc yesterday 600 cc in bag now; clear yellow  Bun/Cr improving Cr 3.49 today Wbc 11.6 T max 100.5 yesterday; afeb now  Vitals reviewed.   Imaging: Dg Chest Port 1 View  04/09/2015   CLINICAL DATA:  Fever, cough and shortness of breath.  EXAM: PORTABLE CHEST - 1 VIEW  COMPARISON:  03/23/2015  FINDINGS: Right chest port remains in place, tip in the SVC. Development of right perihilar airspace opacity. Again seen elevation of right hemidiaphragm. Mild atelectasis at the left lung base. Cardiomediastinal contours are unchanged. No pulmonary edema. No pneumothorax.  IMPRESSION: Development of right perihilar airspace opacity, concerning for pneumonia.  Followup PA and lateral chest X-ray is recommended in 3-4 weeks following trial of antibiotic therapy to ensure resolution and exclude underlying malignancy.   Electronically Signed   By: Jeb Levering M.D.   On: 04/09/2015 06:50    Labs:  CBC:  Recent Labs  04/08/15 0455 04/09/15 0323 04/10/15 0412 04/11/15 0526  WBC 12.5* 12.7* 11.9* 11.6*  HGB 9.2* 9.7* 9.3* 8.1*  HCT 27.3* 30.0* 28.8* 25.1*  PLT 83* 121* 120* 112*    COAGS:  Recent Labs  09/26/14 1055  11/10/14 0855 11/28/14 1330  02/18/15 0025  04/08/15 0455 04/09/15 0323 04/10/15 0412 04/11/15 0526  INR 1.14  < > 1.21 1.21  < > 3.54*  < > 1.28 1.37 1.26 1.38  APTT 32  --  27 37  --  52*  --   --   --   --   --   < > = values in this interval not displayed.  BMP:  Recent Labs  04/08/15 0455 04/09/15 0323 04/10/15 0412 04/11/15 0526  NA 135 142 144 144  K 4.6 4.1 3.0* 2.5*  CL 104 109 112* 109  CO2 16* 13* 17* 23  GLUCOSE 98 142* 164* 135*  BUN 72* 64* 61* 52*  CALCIUM 8.6* 8.3* 8.1* 7.7*  CREATININE 10.69* 7.34* 5.04* 3.49*  GFRNONAA 5* 7* 12* 18*  GFRAA 5* 8* 13* 21*    LIVER FUNCTION TESTS:  Recent Labs  03/25/15 1149 03/26/15 0400 04/03/15 2105 04/04/15 0225 04/05/15 0453 04/06/15 0449  BILITOT 0.2* 0.4 0.1* 0.5  --   --   AST 16 12* 12* 12*  --   --   ALT 8* 7* 8* 8*  --   --   ALKPHOS 75 68 97 92  --   --   PROT 6.5 6.5 7.1 6.8  --   --   ALBUMIN 2.1* 2.0* 2.4* 2.3* 2.1* 2.3*    Assessment and Plan:  B PCNs intact B hydro- obstructive hydro Bladder Ca Will follow Plan per Uro/TRH  Signed: Derelle Cockrell A 04/11/2015, 1:55 PM   I spent a total of  15 min at the the patient's bedside AND on the patient's hospital floor or unit, greater than 50% of which was counseling/coordinating care for B PCN
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
## 2015-04-12 LAB — BASIC METABOLIC PANEL
ANION GAP: 11 (ref 5–15)
BUN: 35 mg/dL — ABNORMAL HIGH (ref 6–20)
CO2: 28 mmol/L (ref 22–32)
Calcium: 7.7 mg/dL — ABNORMAL LOW (ref 8.9–10.3)
Chloride: 106 mmol/L (ref 101–111)
Creatinine, Ser: 2.47 mg/dL — ABNORMAL HIGH (ref 0.61–1.24)
GFR, EST AFRICAN AMERICAN: 32 mL/min — AB (ref >60)
GFR, EST NON AFRICAN AMERICAN: 27 mL/min — AB (ref >60)
Glucose, Bld: 139 mg/dL — ABNORMAL HIGH (ref 65–99)
POTASSIUM: 2.5 mmol/L — AB (ref 3.5–5.1)
SODIUM: 145 mmol/L (ref 135–145)

## 2015-04-12 LAB — PROTIME-INR
INR: 1.27 (ref 0.00–1.49)
PROTHROMBIN TIME: 16 s — AB (ref 11.6–15.2)

## 2015-04-12 LAB — CULTURE, BODY FLUID-BOTTLE

## 2015-04-12 LAB — CULTURE, BODY FLUID W GRAM STAIN -BOTTLE

## 2015-04-12 LAB — CBC
HCT: 26.2 % — ABNORMAL LOW (ref 39.0–52.0)
Hemoglobin: 8.2 g/dL — ABNORMAL LOW (ref 13.0–17.0)
MCH: 26.3 pg (ref 26.0–34.0)
MCHC: 31.3 g/dL (ref 30.0–36.0)
MCV: 84 fL (ref 78.0–100.0)
PLATELETS: 119 10*3/uL — AB (ref 150–400)
RBC: 3.12 MIL/uL — AB (ref 4.22–5.81)
RDW: 18.1 % — ABNORMAL HIGH (ref 11.5–15.5)
WBC: 11.3 10*3/uL — AB (ref 4.0–10.5)

## 2015-04-12 LAB — HEPARIN LEVEL (UNFRACTIONATED): Heparin Unfractionated: 0.22 IU/mL — ABNORMAL LOW (ref 0.30–0.70)

## 2015-04-12 MED ORDER — SODIUM BICARBONATE 8.4 % IV SOLN
INTRAVENOUS | Status: DC
  Administered 2015-04-12 – 2015-04-16 (×3): via INTRAVENOUS
  Filled 2015-04-12 (×10): qty 1000

## 2015-04-12 MED ORDER — VANCOMYCIN HCL 10 G IV SOLR
1250.0000 mg | INTRAVENOUS | Status: AC
  Administered 2015-04-12 – 2015-04-16 (×5): 1250 mg via INTRAVENOUS
  Filled 2015-04-12 (×5): qty 1250

## 2015-04-12 MED ORDER — WARFARIN - PHARMACIST DOSING INPATIENT
Freq: Every day | Status: DC
  Administered 2015-04-12 – 2015-04-16 (×5)

## 2015-04-12 MED ORDER — GUAIFENESIN-DM 100-10 MG/5ML PO SYRP: 5.0000 mL | ORAL_SOLUTION | ORAL | Status: DC | PRN

## 2015-04-12 MED ORDER — POTASSIUM CHLORIDE CRYS ER 20 MEQ PO TBCR
60.0000 meq | EXTENDED_RELEASE_TABLET | Freq: Four times a day (QID) | ORAL | Status: DC
  Filled 2015-04-12: qty 3

## 2015-04-12 MED ORDER — MAGNESIUM SULFATE 2 GM/50ML IV SOLN
2.0000 g | Freq: Three times a day (TID) | INTRAVENOUS | Status: AC
  Administered 2015-04-12 (×2): 2 g via INTRAVENOUS
  Filled 2015-04-12 (×2): qty 50

## 2015-04-12 MED ORDER — WARFARIN SODIUM 2.5 MG PO TABS
2.5000 mg | ORAL_TABLET | Freq: Once | ORAL | Status: AC
  Administered 2015-04-12: 2.5 mg via ORAL
  Filled 2015-04-12: qty 1

## 2015-04-12 MED ORDER — ENSURE ENLIVE PO LIQD
237.0000 mL | Freq: Two times a day (BID) | ORAL | Status: DC
  Administered 2015-04-15 – 2015-04-20 (×6): 237 mL via ORAL

## 2015-04-12 MED ORDER — GUAIFENESIN ER 600 MG PO TB12
1200.0000 mg | ORAL_TABLET | Freq: Two times a day (BID) | ORAL | Status: DC
  Administered 2015-04-16 – 2015-04-21 (×10): 1200 mg via ORAL
  Filled 2015-04-12 (×14): qty 2

## 2015-04-12 NOTE — Progress Notes (Signed)
ANTICOAGULATION & ANTIBIOTIC CONSULT NOTE - Follow Up Consult  Pharmacy Consult for Heparin + Warfarin & Vancomycin + Fluconazole + Primaxin Indication: Hx PE (while warfarin on hold) & HCAP/UTI/infected nephrostomy  Allergies  Allergen Reactions  . Tomato Other (See Comments)    Causes acid reflux    Patient Measurements: Height: 5\' 4"  (162.6 cm) Weight: 218 lb 11.1 oz (99.2 kg) IBW/kg (Calculated) : 59.2 Heparin Dosing Weight: 82 kg  Vital Signs: Temp: 98.9 F (37.2 C) (09/15 0604) BP: 138/79 mmHg (09/15 0604) Pulse Rate: 89 (09/15 0604)  Labs:  Recent Labs  04/10/15 0412 04/10/15 1420 04/11/15 0526  HGB 9.3*  --  8.1*  HCT 28.8*  --  25.1*  PLT 120*  --  112*  LABPROT 16.0*  --  17.1*  INR 1.26  --  1.38  HEPARINUNFRC 0.18* 0.55 0.42  CREATININE 5.04*  --  3.49*    Estimated Creatinine Clearance: 24.8 mL/min (by C-G formula based on Cr of 3.49).   Medications:  Heparin @ 2100 units/hr (21 ml/hr) Fluconazole 200 mg IV every 24 hours Primaxin 250 mg IV every 6 hours Vancomycin 1250 mg IV every 48 hours  Assessment: 45 YOF who continues on heparin while warfarin is on hold for hx PE. Heparin level this morning is slightly SUBtherapeutic (HL 0.22 << 0.42, goal of 0.3-0.7). Hgb/Hct/Plt low but stable. Also resuming warfarin today, INR 1.27 - the patient is on fluconazole which is known to increase sensitivity. Will watch rise in INR closely.   The patient also continues on Vancomycin + Primaxin for HCAP. Primaxin is also covering for ESBL Klebsiella PNA UTI. Fluconazole is covering for candida tropicalis cultured from the nephrostomy fluid. Renal function is noted to be improving after the placement of nephrostomy tubes. SCr 3.49, CrCl~20-30 ml/min.   Goal of Therapy:  Heparin level 0.3-0.7 units/ml   Plan:  1. Increase Heparin to 2250 units/hr 2. Warfarin 2.5 mg x 1 dose at 1800 today 3. Adjust Vancomycin to 1250 mg IV every 24 hours 4. Continue Primaxin to  250 mg IV every 6 hours 5. Continue Fluconazole 200 mg IV every 24 hours 6. Will continue to monitor for any signs/symptoms of bleeding and will follow up with heparin level in the a.m.  7. Will continue to follow renal function, culture results, LOT, and antibiotic de-escalation plans   Alycia Rossetti, PharmD, BCPS Clinical Pharmacist Pager: [phone] 04/12/2015 8:28 AM

## 2015-04-12 NOTE — Progress Notes (Signed)
Nutrition Follow-up  DOCUMENTATION CODES:   Obesity unspecified  INTERVENTION:   Provide Ensure Enlive po BID thickened to nectar thick, each supplement provides 350 kcal and 20 grams of protein  Encourage adequate PO intake.  NUTRITION DIAGNOSIS:   Inadequate oral intake related to inability to eat as evidenced by NPO status; advanced to diet; po 0%; ongoing  GOAL:   Patient will meet greater than or equal to 90% of their needs; not met  MONITOR:   PO intake, Supplement acceptance, Weight trends, Labs, I & O's  REASON FOR ASSESSMENT:   Low Braden    ASSESSMENT:   59 year old Caucasian male. He was discharged about a week ago after being managed for acute renal failure. He lives in a skilled nursing facility. He was sent over to the emergency department for seizure type activity. Evaluation revealed worsening in his renal function. He was hospitalized for further management. He underwent bilateral nephrostomy by interventional radiology.  Diet has been advanced to a dysphagia 2 diet with nectar thick liquids. Meal completion this morning recorded to be 0%. RD attempted to obtain nutrition hx, however pt started to become frustrated. RD to order Ensure to aid in caloric and protein needs.   Labs and medications reviewed.   Diet Order:  DIET DYS 2 Room service appropriate?: Yes; Fluid consistency:: Nectar Thick  Skin:  Reviewed, no issues (+1 UE, +2 LLE edema)  Last BM:  colostomy  Height:   Ht Readings from Last 1 Encounters:  04/08/15 _0  (1.626 m)    Weight:   Wt Readings from Last 1 Encounters:  04/11/15 218 lb 11.1 oz (99.2 kg)    Ideal Body Weight:  54 kg (adjusted for L AKA)  BMI:  Body mass index is 37.52 kg/(m^2).  Estimated Nutritional Needs:   Kcal:  2000-2200  Protein:  100-120 grams  Fluid:  Per MD  EDUCATION NEEDS:   No education needs identified at this time  Corrin Parker, MS, RD, LDN Pager # [phone] After hours/ weekend  pager # [phone]

## 2015-04-12 NOTE — Progress Notes (Signed)
04/12/2015 8:28 PM   Noted that heparin infusion was stopped from ~ 1630-1800 for Vancomycin administration.  Will retime heparin level to midnight tonight - 6 hours after restart of heparin infusion.  [employer], Pharm.D., BCPS Clinical Pharmacist Pager [phone]

## 2015-04-12 NOTE — Progress Notes (Signed)
Patient has refused all PO medications today. Dr. Hartford Poli notified and some orders have been changed accordingly, specifically patient's PO potassium.   Also, patient on a continuous heparin drip and he is scheduled to receive a dose of IV vancocin this afternoon; these medications are not compatible to be infused together. Unit pharmacist notified. IV team consult placed to attempt to get a new PIV. If this is not successful, heparin drip will need to be stopped for the 90 minute vancocin infusion and then restarted immediately after. Unit pharmacist is aware of this possibility and stated she will change his heparin drip appropriately if this needs to happen. Will follow up as needed.  Joellen Jersey, RN.

## 2015-04-12 NOTE — Progress Notes (Signed)
CRITICAL VALUE ALERT  Critical value received:  K 2.5  Date of notification:  04/12/15  Time of notification:  6484  Critical value read back:Yes.    Nurse who received alert:  Larena Glassman, RN  MD notified (1st page):  Dr. Hartford Poli  Time of first page:  1039  MD notified (2nd page):  Time of second page:  Responding MD:  Dr. Hartford Poli  Time MD responded:  1100
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
## 2015-04-12 NOTE — Progress Notes (Signed)
Unable to get new PIV started on patient so he can receive scheduled vancocin dose. Verified with pharmacist Johny Shears that heparin drip will need to be put on hold for the length of vancocin infusion and then will be resumed immediately after it's completed. Vancocin infusion rate was able to be increased to 188 ml/hrs at the maximum limit for this dose.   Joellen Jersey, RN.

## 2015-04-12 NOTE — Clinical Social Work Note (Signed)
Clinical Social Work Assessment  Patient Details  Name: Samuel [name] MRN: [id number] Date of Birth: [birth year]  Date of referral:  04/04/15               Reason for consult:  Facility Placement                Permission sought to share information with:  Facility Sport and exercise psychologist, [facility] ([facility] [facility] ) Permission granted to share information::  Yes, Verbal Permission Granted  Name::        Agency::  [facility] skilled facility  Relationship::     Contact Information:  Daughters Soil scientist and Aadil Sur  Housing/Transportation Living arrangements for the past 2 months:  [place] of Information:  Patient Patient Interpreter Needed:  None Criminal Activity/Legal Involvement Pertinent to Current Situation/Hospitalization:  No - Comment as needed Significant Relationships:  Adult Children Lives with:  Facility Resident [facility]) Do you feel safe going back to the place where you live?  Yes Need for family participation in patient care:  No (Coment)  Care giving concerns:  None expressed by patient   Social Worker assessment / plan:  CSW talked with patient at the bedside regarding discharge planning. Per patient, he will return to [place] at discharge.  Employment status:  Disabled (Comment on whether or not currently receiving Disability) Insurance information:  Medicaid In [place], [employer] PT Recommendations:  Not assessed at this time Information / Referral to community resources:  Gross (Patient from a facility)  Patient/Family's Response to care:  Not discussed.  Patient/Family's Understanding of and Emotional Response to Diagnosis, Current Treatment, and Prognosis:  Not discussed.  Emotional Assessment Appearance:  Appears stated age Attitude/Demeanor/Rapport:  Other (Appropriate) Affect (typically observed):  Appropriate Orientation:  Oriented to Place, Oriented to  Self Alcohol / Substance use:  Tobacco Use (Patient reports that he has quit smoking and does not drink or use illicit drugs.) Psych involvement (Current and /or in the community):  No (Comment)  Discharge Needs  Concerns to be addressed:  Discharge Planning Concerns Readmission within the last 30 days:  Yes Current discharge risk:  None Barriers to Discharge:  No Barriers Identified   Sable Feil, LCSW 04/12/2015, 5:42 PM

## 2015-04-13 LAB — CBC
HEMATOCRIT: 24.7 % — AB (ref 39.0–52.0)
HEMOGLOBIN: 7.6 g/dL — AB (ref 13.0–17.0)
MCH: 26.1 pg (ref 26.0–34.0)
MCHC: 30.8 g/dL (ref 30.0–36.0)
MCV: 84.9 fL (ref 78.0–100.0)
Platelets: 116 10*3/uL — ABNORMAL LOW (ref 150–400)
RBC: 2.91 MIL/uL — AB (ref 4.22–5.81)
RDW: 18.1 % — ABNORMAL HIGH (ref 11.5–15.5)
WBC: 11 10*3/uL — ABNORMAL HIGH (ref 4.0–10.5)

## 2015-04-13 LAB — PROTIME-INR
INR: 1.33 (ref 0.00–1.49)
PROTHROMBIN TIME: 16.6 s — AB (ref 11.6–15.2)

## 2015-04-13 LAB — BASIC METABOLIC PANEL
ANION GAP: 12 (ref 5–15)
BUN: 27 mg/dL — ABNORMAL HIGH (ref 6–20)
CO2: 31 mmol/L (ref 22–32)
Calcium: 7.6 mg/dL — ABNORMAL LOW (ref 8.9–10.3)
Chloride: 100 mmol/L — ABNORMAL LOW (ref 101–111)
Creatinine, Ser: 2 mg/dL — ABNORMAL HIGH (ref 0.61–1.24)
GFR calc Af Amer: 41 mL/min — ABNORMAL LOW (ref >60)
GFR, EST NON AFRICAN AMERICAN: 35 mL/min — AB (ref >60)
GLUCOSE: 155 mg/dL — AB (ref 65–99)
POTASSIUM: 2.3 mmol/L — AB (ref 3.5–5.1)
SODIUM: 143 mmol/L (ref 135–145)

## 2015-04-13 LAB — MAGNESIUM: MAGNESIUM: 2.2 mg/dL (ref 1.7–2.4)

## 2015-04-13 LAB — HEPARIN LEVEL (UNFRACTIONATED)
HEPARIN UNFRACTIONATED: 0.37 [IU]/mL (ref 0.30–0.70)
Heparin Unfractionated: 0.46 IU/mL (ref 0.30–0.70)

## 2015-04-13 MED ORDER — WARFARIN SODIUM 2.5 MG PO TABS
2.5000 mg | ORAL_TABLET | Freq: Once | ORAL | Status: AC
  Administered 2015-04-13: 2.5 mg via ORAL
  Filled 2015-04-13: qty 1

## 2015-04-13 MED ORDER — POTASSIUM CHLORIDE 20 MEQ/15ML (10%) PO SOLN
40.0000 meq | Freq: Four times a day (QID) | ORAL | Status: AC
  Administered 2015-04-13: 40 meq
  Filled 2015-04-13 (×2): qty 30

## 2015-04-13 MED ORDER — POTASSIUM CHLORIDE 20 MEQ/15ML (10%) PO SOLN: 40.0000 meq | Freq: Four times a day (QID) | ORAL | Status: DC

## 2015-04-13 MED ORDER — POTASSIUM CHLORIDE 10 MEQ/100ML IV SOLN
10.0000 meq | INTRAVENOUS | Status: AC
  Administered 2015-04-13 (×4): 10 meq via INTRAVENOUS
  Filled 2015-04-13 (×3): qty 100

## 2015-04-13 MED ORDER — POTASSIUM CHLORIDE 10 MEQ/100ML IV SOLN
10.0000 meq | INTRAVENOUS | Status: AC
  Administered 2015-04-13 (×3): 10 meq via INTRAVENOUS
  Filled 2015-04-13 (×2): qty 100

## 2015-04-13 NOTE — Care Management Important Message (Signed)
Important Message  Patient Details  Name: Samuel [name] MRN: [id number] Date of Birth: 24-Mar-1956   Medicare Important Message Given:  Yes, Fifth notification given.     Maryem Shuffler, Rory Percy, RN 04/13/2015, 2:10 PM

## 2015-04-13 NOTE — Progress Notes (Signed)
ANTICOAGULATION & ANTIBIOTIC CONSULT NOTE - Follow Up Consult  Pharmacy Consult for Heparin + Warfarin & Vancomycin + Fluconazole + Primaxin Indication: Hx PE & HCAP/UTI/infected nephrostomy  Allergies  Allergen Reactions  . Tomato Other (See Comments)    Causes acid reflux    Patient Measurements: Height: 5\' 4"  (162.6 cm) Weight: 217 lb 9.5 oz (98.7 kg) IBW/kg (Calculated) : 59.2 Heparin Dosing Weight: 82 kg  Vital Signs: Temp: 98.5 F (36.9 C) (09/16 1050) Temp Source: Oral (09/16 1050) BP: 153/85 mmHg (09/16 1050) Pulse Rate: 102 (09/16 1050)  Labs:  Recent Labs  04/11/15 0526 04/12/15 0922 04/13/15 0003 04/13/15 0425 04/13/15 0549  HGB 8.1* 8.2*  --  7.6*  --   HCT 25.1* 26.2*  --  24.7*  --   PLT 112* 119*  --  116*  --   LABPROT 17.1* 16.0*  --  16.6*  --   INR 1.38 1.27  --  1.33  --   HEPARINUNFRC 0.42 0.22* 0.46  --  0.37  CREATININE 3.49* 2.47*  --  2.00*  --     Estimated Creatinine Clearance: 43.2 mL/min (by C-G formula based on Cr of 2).   Medications:  Heparin @ 2250 units/hr (22.5 ml/hr) Fluconazole 200 mg IV every 24 hours Primaxin 250 mg IV every 6 hours Vancomycin 1250 mg IV every 48 hours  Assessment: 89 YOF who continues on heparin while warfarin is on hold for hx PE. Heparin level this morning is therapeutic (HL 0.37 << 0.22, goal of 0.3-0.7). It is noted that the heparin drip is having to be held to give IV Vancomycin - pharmacy is aware. INR today remains SUBtherapeutic (INR 1.33 << 1.27, goal of 2-3).The patient is on fluconazole and has poor oral intake (0-5% of meals charted as being eaten) - both of these factors will increase warfarin sensitivity, so will monitor closely. Hgb/Hct/Plt slight drop - no overt s/sx of bleeding noted.   The patient also continues on Vancomycin + Primaxin for HCAP. Primaxin is also covering for ESBL Klebsiella PNA UTI. Fluconazole is covering for candida tropicalis cultured from the nephrostomy fluid. Renal  function is noted to be improving after the placement of nephrostomy tubes. SCr 2 << 2.47, CrCl~40 ml/min. No dose adjustments needed today - but likely will be needed tomorrow if renal function improves.   Goal of Therapy:  Heparin level 0.3-0.7 units/ml   Plan:  1. Continue Heparin at 2250 units/hr (22.5 ml/hr) 2. Warfarin 2.5 mg x 1 dose at 1800 today 3. Continue Vancomycin to 1250 mg IV every 24 hours 4. Continue Primaxin to 250 mg IV every 6 hours 5. Continue Fluconazole 200 mg IV every 24 hours 6. Will continue to monitor for any signs/symptoms of bleeding and will follow up with heparin level in the a.m.  7. Will continue to follow renal function, culture results, LOT, and antibiotic de-escalation plans   Alycia Rossetti, PharmD, BCPS Clinical Pharmacist Pager: [phone] 04/13/2015 12:02 PM

## 2015-04-13 NOTE — Progress Notes (Signed)
Spoke with patient's daughter [name]), regarding the patient.  Informed daughter about the order to insert NG tube for feeding & medications.  Daughter [name]) stated that the patient would probably just pull the tube out.  Transferred phone call into the patient's room.  Daughter [name]) talked with the patient to try to convince him about having the NG tube inserted.  Spoke to daughter [name]) after her conversation with the patient.  Daughter [name]) stated she could not convince the patient about anything because he was talking incoherently.  Daughter [name]) stated that she would speak to her sister, [name], & one of them would call us back.  Jillyn Ledger, MBA, BS, RN

## 2015-04-13 NOTE — Progress Notes (Signed)
ANTICOAGULATION & ANTIBIOTIC CONSULT NOTE - Follow Up Consult  Pharmacy Consult for Heparin Indication: Hx PE  Allergies  Allergen Reactions  . Tomato Other (See Comments)    Causes acid reflux    Patient Measurements: Height: 5\' 4"  (162.6 cm) Weight: 218 lb 11.1 oz (99.2 kg) IBW/kg (Calculated) : 59.2 Heparin Dosing Weight: 82 kg  Vital Signs: Temp: 98.5 F (36.9 C) (09/15 2131) Temp Source: Oral (09/15 2131) BP: 150/119 mmHg (09/15 2131) Pulse Rate: 101 (09/15 2131)  Labs:  Recent Labs  04/10/15 0412  04/11/15 0526 04/12/15 0922 04/13/15 0003  HGB 9.3*  --  8.1* 8.2*  --   HCT 28.8*  --  25.1* 26.2*  --   PLT 120*  --  112* 119*  --   LABPROT 16.0*  --  17.1* 16.0*  --   INR 1.26  --  1.38 1.27  --   HEPARINUNFRC 0.18*  < > 0.42 0.22* 0.46  CREATININE 5.04*  --  3.49* 2.47*  --   < > = values in this interval not displayed.  Estimated Creatinine Clearance: 35.1 mL/min (by C-G formula based on Cr of 2.47).   Medications:  Heparin @ 2100 units/hr (21 ml/hr)  Assessment: 89 YOF who continues on heparin while warfarin is on hold for hx PE. Heparin level this morning is slightly SUBtherapeutic (HL 0.22 << 0.42, goal of 0.3-0.7). Hgb/Hct/Plt low but stable. Also resuming warfarin today, INR 1.27 - the patient is on fluconazole which is known to increase sensitivity. Will watch rise in INR closely.   F/u HL remains therapeutic at 0.46 on heparin 2250 units/hr after being held for vancomycin infusion. No other issues with infusion or bleeding are noted.  Goal of Therapy:  Heparin level 0.3-0.7 units/ml   Plan:  Continue heparin 2250 units/hr 6h confirmatory HL Will continue to monitor for any signs/symptoms of bleeding and will follow up with heparin level in the a.m.   Andrey Cota. Diona Foley, PharmD Clinical Pharmacist Pager [phone]  04/13/2015 1:26 AM

## 2015-04-13 NOTE — Progress Notes (Signed)
SLP Cancellation Note  Patient Details Name: Samuel [name] MRN: [id number] DOB: Dec 05, 1955   Cancelled treatment:       Reason Eval/Treat Not Completed: Patient declined, no reason specified (patient refused any P.O.'s)   Dannial Monarch 04/13/2015, 4:40 PM   Sonia Baller, MA, CCC-SLP 04/13/2015 4:40 PM

## 2015-04-13 NOTE — Progress Notes (Addendum)
  Pt alert this evening.   Procedure: The patient was prepped and draped in usual fashion. A flexible cystoscope was inserted per urethra. The right stent was visualized, grasped and removed intact. The left stent was difficult to identify as it was surrounded by malignant tissue. It was eventually grasped and removed intact. We then easily placed a 16 F foley catheter with return of urine prior to inflation of the balloon and with good seating upon pulling out the residual catheter.  Filed Vitals:   04/13/15 1050  BP: 153/85  Pulse: 102  Temp: 98.5 F (36.9 C)  Resp: 18    Intake/Output Summary (Last 24 hours) at 04/13/15 1725 Last data filed at 04/13/15 1500  Gross per 24 hour  Intake    520 ml  Output   3975 ml  Net  -3455 ml    PE: Pt alert, knows I'm his "doctor" Perc tubes with good UOP Urine in foley now, too. Clear.  IMP/Plan - ureteral stents removed at bedside. Leave PCNs to drainage. Leave foley in place for now. Pt with amazing improvement. Appreciate great hospitalist care.  Will follow.   Attending attestation: Patient seen and examined with Dr. Carlota Raspberry. I was present and assisted for the entire procedure, I performed some of the key portions. I agree with his assessment and plan. I wanted to go ahead and remove the stents as they are colonized with yeast and bacteria while patient is more stable and in hospital in case pt developed issues with the perc tubes or developed another fever.

## 2015-04-13 NOTE — Progress Notes (Signed)
Referring Physician(s): Dr. [name]  Chief Complaint:  S/P bil Percutaneous nephrostomy tube placement 04/07/15 by Dr. Earleen Newport  Subjective:  Samuel [name] is awake today.  He has no complaints.   Allergies: Tomato  Medications: Prior to Admission medications   Medication Sig Start Date End Date Taking? Authorizing Provider  acetaminophen (TYLENOL) 325 MG tablet Take 650 mg by mouth every 6 (six) hours as needed for moderate pain.   Yes Historical Provider, MD  alum & mag hydroxide-simeth (MAALOX PLUS) 400-400-40 MG/5ML suspension Take 20 mLs by mouth every 6 (six) hours as needed for indigestion.   Yes Historical Provider, MD  amLODipine (NORVASC) 5 MG tablet Take 1 tablet (5 mg total) by mouth daily. 03/27/15  Yes Bonnielee Haff, MD  atorvastatin (LIPITOR) 10 MG tablet Take 10 mg by mouth daily at 6 PM.   Yes Historical Provider, MD  baclofen (LIORESAL) 10 MG tablet Take 0.5 tablets (5 mg total) by mouth 3 (three) times daily. 03/27/15  Yes Bonnielee Haff, MD  Cholecalciferol 50000 UNITS capsule Take 50,000 Units by mouth every 30 (thirty) days.   Yes Historical Provider, MD  clonazePAM (KLONOPIN) 0.5 MG tablet Take 1/2 tablet by mouth twice daily 03/27/15  Yes Bonnielee Haff, MD  cyanocobalamin 1000 MCG tablet Take 100 mcg by mouth daily.    Yes Historical Provider, MD  docusate sodium (COLACE) 100 MG capsule Take 100 mg by mouth 2 (two) times daily.   Yes Historical Provider, MD  famotidine (PEPCID) 20 MG tablet Take 20 mg by mouth at bedtime.   Yes Historical Provider, MD  ferrous sulfate 325 (65 FE) MG EC tablet Take 325 mg by mouth 2 (two) times daily.   Yes Historical Provider, MD  folic acid (FOLVITE) 1 MG tablet Take 1 mg by mouth daily.   Yes Historical Provider, MD  guaiFENesin (ROBITUSSIN) 100 MG/5ML SOLN Take 15 mLs by mouth 3 (three) times daily as needed for cough or to loosen phlegm.   Yes Historical Provider, MD  lactose free nutrition (BOOST) LIQD Take 237 mLs by  mouth 2 (two) times daily between meals.   Yes Historical Provider, MD  lactulose (CHRONULAC) 10 GM/15ML solution Take 20 g by mouth 4 (four) times daily.   Yes Historical Provider, MD  lamoTRIgine (LAMICTAL) 100 MG tablet Take 300 mg by mouth 2 (two) times daily.   Yes Historical Provider, MD  lidocaine (LIDODERM) 5 % Place 2 patches onto the skin as needed (for pain). Remove & Discard patch within 12 hours or as directed by MD (apply 2 patches to left stump daily at 9pm   Yes Historical Provider, MD  linezolid (ZYVOX) 600 MG tablet Take 1 tablet (600 mg total) by mouth every 12 (twelve) hours. For 10 more days Patient taking differently: Take 600 mg by mouth every 12 (twelve) hours. Started therapy unknown. Finish therapy on 04-07-15 per [facility] from [facility] 03/27/15  Yes Bonnielee Haff, MD  metoprolol tartrate (LOPRESSOR) 25 MG tablet Take 1 tablet (25 mg total) by mouth 2 (two) times daily. 09/28/14  Yes Orson Eva, MD  omeprazole (PRILOSEC) 20 MG capsule Take 20 mg by mouth daily.   Yes Historical Provider, MD  ondansetron (ZOFRAN) 4 MG tablet Take 4 mg by mouth every 8 (eight) hours as needed for nausea or vomiting.   Yes Historical Provider, MD  oxybutynin (DITROPAN-XL) 5 MG 24 hr tablet Take 1 tablet (5 mg total) by mouth every morning. 03/27/15  Yes Bonnielee Haff, MD  Polyethyl  Glycol-Propyl Glycol (SYSTANE) 0.4-0.3 % SOLN Apply 2 drops to eye 3 (three) times daily.   Yes Historical Provider, MD  pregabalin (LYRICA) 25 MG capsule Take 25 mg by mouth 3 (three) times daily.   Yes Historical Provider, MD  promethazine (PHENERGAN) 25 MG tablet Take 25 mg by mouth every 8 (eight) hours as needed for nausea or vomiting.   Yes Historical Provider, MD  promethazine (PHENERGAN) 25 MG/ML injection Inject 25 mg into the muscle every 4 (four) hours as needed for nausea or vomiting.   Yes Historical Provider, MD  QUEtiapine (SEROQUEL) 300 MG tablet Take 300 mg by mouth at bedtime.   Yes Historical Provider, MD    senna (SENOKOT) 8.6 MG tablet Take 2 tablets by mouth every morning.   Yes Historical Provider, MD  silver sulfADIAZINE (SILVADENE) 1 % cream Apply topically 2 (two) times daily. 03/27/15  Yes Bonnielee Haff, MD  traZODone (DESYREL) 50 MG tablet Take 1 tablet (50 mg total) by mouth at bedtime. 03/27/15  Yes Bonnielee Haff, MD  warfarin (COUMADIN) 2.5 MG tablet Take 2.5 mg by mouth daily.   Yes Historical Provider, MD  lamoTRIgine (LAMICTAL) 200 MG tablet Take 0.5 tablets (100 mg total) by mouth 2 (two) times daily. Patient not taking: Reported on 04/03/2015 02/22/15   Nishant Dhungel, MD  nitroGLYCERIN (NITROSTAT) 0.4 MG SL tablet Place 0.4 mg under the tongue every 5 (five) minutes as needed for chest pain.    Historical Provider, MD  oxyCODONE (OXY IR/ROXICODONE) 5 MG immediate release tablet Take 1 tablet (5 mg total) by mouth every 4 (four) hours as needed for moderate pain or severe pain. Patient not taking: Reported on 04/03/2015 03/27/15   Bonnielee Haff, MD     Vital Signs: BP 153/85 mmHg  Pulse 102  Temp(Src) 98.5 F (36.9 C) (Oral)  Resp 18  Ht 5\' 4"  (1.626 m)  Wt 217 lb 9.5 oz (98.7 kg)  BMI 37.33 kg/m2  SpO2 99%  Physical Exam   No abdominal pain with palpation Abdomen soft Perc neph tubes in place bilaterally, look good Both bags recently emptied, so no urine in either at the time of exam. ~ 4,250 output.   Imaging: No results found.  Labs:  CBC:  Recent Labs  04/10/15 0412 04/11/15 0526 04/12/15 0922 04/13/15 0425  WBC 11.9* 11.6* 11.3* 11.0*  HGB 9.3* 8.1* 8.2* 7.6*  HCT 28.8* 25.1* 26.2* 24.7*  PLT 120* 112* 119* 116*    COAGS:  Recent Labs  09/26/14 1055  11/10/14 0855 11/28/14 1330  02/18/15 0025  04/10/15 0412 04/11/15 0526 04/12/15 0922 04/13/15 0425  INR 1.14  < > 1.21 1.21  < > 3.54*  < > 1.26 1.38 1.27 1.33  APTT 32  --  27 37  --  52*  --   --   --   --   --   < > = values in this interval not displayed.  BMP:  Recent Labs   04/10/15 0412 04/11/15 0526 04/12/15 0922 04/13/15 0425  NA 144 144 145 143  K 3.0* 2.5* 2.5* 2.3*  CL 112* 109 106 100*  CO2 17* 23 28 31   GLUCOSE 164* 135* 139* 155*  BUN 61* 52* 35* 27*  CALCIUM 8.1* 7.7* 7.7* 7.6*  CREATININE 5.04* 3.49* 2.47* 2.00*  GFRNONAA 12* 18* 27* 35*  GFRAA 13* 21* 32* 41*    LIVER FUNCTION TESTS:  Recent Labs  03/25/15 1149 03/26/15 0400 04/03/15 2105 04/04/15 0225 04/05/15 0453 04/06/15  0449  BILITOT 0.2* 0.4 0.1* 0.5  --   --   AST 16 12* 12* 12*  --   --   ALT 8* 7* 8* 8*  --   --   ALKPHOS 75 68 97 92  --   --   PROT 6.5 6.5 7.1 6.8  --   --   ALBUMIN 2.1* 2.0* 2.4* 2.3* 2.1* 2.3*    Assessment and Plan:  S/P Bilateral percutaneous nephrostomy tube placement 04/07/15 by Dr. Lavena Bullion output from both Will sign off. Call if we can be of further assistance. [phone]  Signed: Murrell Redden PA-C 04/13/2015, 11:41 AM   I spent a total of 15 Minutes at the the patient's bedside AND on the patient's hospital floor or unit, greater than 50% of which was counseling/coordinating care for f/u of perc nephrostomy tubes.
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
## 2015-04-13 NOTE — Progress Notes (Signed)
Per daughter's request, RN waited until her arrival to attempt placing NG tube. Pt still confused and speaking incoherently at times, but strongly refusing NG. Pt's daughter [name]) spoke with the patient and came to the conclusion that she does NOT want him to receive an NG tube. RN counseled [name] on the implications of him not receiving an NG tube and thus not receiving the oral potassium. [name] verbalized that she understands this could result in cardiac dysfunction and ultimately death.

## 2015-04-14 LAB — BASIC METABOLIC PANEL
Anion gap: 10 (ref 5–15)
BUN: 16 mg/dL (ref 6–20)
CHLORIDE: 103 mmol/L (ref 101–111)
CO2: 35 mmol/L — ABNORMAL HIGH (ref 22–32)
CREATININE: 1.59 mg/dL — AB (ref 0.61–1.24)
Calcium: 7.5 mg/dL — ABNORMAL LOW (ref 8.9–10.3)
GFR calc non Af Amer: 47 mL/min — ABNORMAL LOW (ref >60)
GFR, EST AFRICAN AMERICAN: 54 mL/min — AB (ref >60)
Glucose, Bld: 142 mg/dL — ABNORMAL HIGH (ref 65–99)
POTASSIUM: 2.6 mmol/L — AB (ref 3.5–5.1)
SODIUM: 148 mmol/L — AB (ref 135–145)

## 2015-04-14 LAB — CULTURE, BLOOD (ROUTINE X 2)
Culture: NO GROWTH
Culture: NO GROWTH

## 2015-04-14 LAB — HEPARIN LEVEL (UNFRACTIONATED): Heparin Unfractionated: 0.4 IU/mL (ref 0.30–0.70)

## 2015-04-14 LAB — PROTIME-INR
INR: 1.45 (ref 0.00–1.49)
PROTHROMBIN TIME: 17.7 s — AB (ref 11.6–15.2)

## 2015-04-14 LAB — GLUCOSE, CAPILLARY: Glucose-Capillary: 143 mg/dL — ABNORMAL HIGH (ref 65–99)

## 2015-04-14 MED ORDER — MAGNESIUM SULFATE 2 GM/50ML IV SOLN
2.0000 g | Freq: Once | INTRAVENOUS | Status: AC
  Administered 2015-04-14: 2 g via INTRAVENOUS
  Filled 2015-04-14: qty 50

## 2015-04-14 MED ORDER — SODIUM CHLORIDE 0.9 % IV SOLN
500.0000 mg | Freq: Three times a day (TID) | INTRAVENOUS | Status: AC
  Administered 2015-04-14 – 2015-04-16 (×7): 500 mg via INTRAVENOUS
  Filled 2015-04-14 (×7): qty 500

## 2015-04-14 MED ORDER — POTASSIUM CHLORIDE CRYS ER 20 MEQ PO TBCR
40.0000 meq | EXTENDED_RELEASE_TABLET | Freq: Four times a day (QID) | ORAL | Status: AC
  Administered 2015-04-14 – 2015-04-15 (×4): 40 meq via ORAL
  Filled 2015-04-14 (×4): qty 2

## 2015-04-14 MED ORDER — FLUCONAZOLE IN SODIUM CHLORIDE 400-0.9 MG/200ML-% IV SOLN
400.0000 mg | INTRAVENOUS | Status: DC
  Administered 2015-04-14 – 2015-04-19 (×6): 400 mg via INTRAVENOUS
  Filled 2015-04-14 (×7): qty 200

## 2015-04-14 MED ORDER — WARFARIN SODIUM 2.5 MG PO TABS
2.5000 mg | ORAL_TABLET | Freq: Once | ORAL | Status: AC
  Administered 2015-04-14: 2.5 mg via ORAL
  Filled 2015-04-14: qty 1

## 2015-04-14 NOTE — Progress Notes (Signed)
  Pt alert. He is making 5 liters of urine out of his PCNs  Filed Vitals:   04/14/15 0607  BP: 149/89  Pulse: 92  Temp: 98.1 F (36.7 C)  Resp: 20    Intake/Output Summary (Last 24 hours) at 04/14/15 1248 Last data filed at 04/14/15 0800  Gross per 24 hour  Intake 2081.63 ml  Output   5350 ml  Net -3268.37 ml    PE: Pt alert, knows I'm his "doctor" Perc tubes with good UOP Small volume urine in foley now, too. Clear.  IMP/Plan  - Physiologic vs pathologic post obstructive diuresis, make sure to monitor electrolytes for derangement.  - Leave PCNs to drainage. -  Leave foley in place for now.  - Appreciate great hospitalist care.  - Will follow.   Attending attestation: Patient seen and examined with Dr. Carlota Raspberry. I agree with his assessment and plan.

## 2015-04-14 NOTE — Progress Notes (Addendum)
ANTICOAGULATION & ANTIBIOTIC CONSULT NOTE - Follow Up Consult  Pharmacy Consult for Heparin + Warfarin & Vancomycin + Fluconazole + Primaxin Indication: Hx PE & HCAP/UTI/infected nephrostomy  Patient Measurements: Height: 5\' 4"  (162.6 cm) Weight: 217 lb 9.5 oz (98.7 kg) IBW/kg (Calculated) : 59.2 Heparin Dosing Weight: 82 kg  Vital Signs: Temp: 98.1 F (36.7 C) (09/17 0607) Temp Source: Oral (09/17 0407) BP: 149/89 mmHg (09/17 0607) Pulse Rate: 92 (09/17 0607)  Labs:  Recent Labs  04/12/15 0922 04/13/15 0003 04/13/15 0425 04/13/15 0549 04/14/15 0718  HGB 8.2*  --  7.6*  --   --   HCT 26.2*  --  24.7*  --   --   PLT 119*  --  116*  --   --   LABPROT 16.0*  --  16.6*  --  17.7*  INR 1.27  --  1.33  --  1.45  HEPARINUNFRC 0.22* 0.46  --  0.37 0.40  CREATININE 2.47*  --  2.00*  --  1.59*    Estimated Creatinine Clearance: 54.4 mL/min (by C-G formula based on Cr of 1.59).   Medications:  Heparin @ 2250 units/hr (22.5 ml/hr) Warfarin Fluconazole 200 mg IV every 24 hours  9/11>> Primaxin 250 mg IV every 6 hours  9/11>> Vancomycin 1250 mg IV every 48 hours 9/12>>  Cultures: 9/8 urine:  ESBL K. pneumo 9/10: left Kidney fluid - candida tropicalis 9/10: right kidney fluid - candida tropicalis and candida glabrata 9/12:  Blood x 2>>no growth final   Assessment: 3 YOF who continues on heparin while warfarin is on hold for hx PE. Heparin level this morning is therapeutic  It is noted that the heparin drip is having to be held to give IV Vancomycin - pharmacy is aware. INR today remains SUBtherapeutic INR 1.45.Marland KitchenThe patient is on IV fluconazole which can increase sensitivity to warfarin.  The patient also continues on Vancomycin + Primaxin for HCAP. Primaxin is also covering for ESBL Klebsiella PNA UTI. Fluconazole is covering for candida species cultured from the nephrostomy fluid. Renal function is noted to be improving after the placement of nephrostomy tubes. SCr 1.59  CrCl~55 ml/min.   Goal of Therapy:  Heparin level 0.3-0.7 units/ml   Plan:  1. Continue Heparin at 2250 units/hr (22.5 ml/hr) 2. Warfarin 2.5 mg x 1 dose at 1800 today 3. Continue Vancomycin to 1250 mg IV every 24 hours 4. Change Primaxin to 500mg  IV q8h 5. Continue Fluconazole 400 mg IV every 24 hours 6. Will continue to monitor for any signs/symptoms of bleeding and will follow up with heparin level in the a.m.  7. Will continue to follow renal function, culture results, LOT, and antibiotic de-escalation plans   Heide Guile, PharmD, BCPS-AQ ID Clinical Pharmacist Pager [phone]   04/14/2015 3:26 PM

## 2015-04-14 NOTE — Progress Notes (Signed)
Palliative Medicine Team consult was received.   I stopped by to speak with Samuel [name] and he remains confused.  I have scheduled a meeting with patient and his daughter, Samuel [name], for 4 PM tomorrow as this is when she was next available.  If there are urgent needs or questions please call [phone]. Thank you for consulting out team to assist with this patients care.  Micheline Rough, MD [facility] [phone]
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
## 2015-04-14 NOTE — Progress Notes (Signed)
CRITICAL VALUE ALERT  Critical value received:  K 2.6  Date of notification:  9/17  Time of notification:  0832  Critical value read back:Yes.    Nurse who received alert:  Tyna Jaksch  MD notified (1st page):  Dr. Hartford Poli  Time of first page:  [phone]  Responding MD:  Dr. Hartford Poli  Time MD responded:  [phone]

## 2015-04-15 DIAGNOSIS — Z515 Encounter for palliative care: Secondary | ICD-10-CM

## 2015-04-15 LAB — BASIC METABOLIC PANEL
Anion gap: 10 (ref 5–15)
BUN: 16 mg/dL (ref 6–20)
CO2: 33 mmol/L — ABNORMAL HIGH (ref 22–32)
CREATININE: 1.81 mg/dL — AB (ref 0.61–1.24)
Calcium: 7.4 mg/dL — ABNORMAL LOW (ref 8.9–10.3)
Chloride: 97 mmol/L — ABNORMAL LOW (ref 101–111)
GFR calc Af Amer: 46 mL/min — ABNORMAL LOW (ref >60)
GFR, EST NON AFRICAN AMERICAN: 40 mL/min — AB (ref >60)
GLUCOSE: 125 mg/dL — AB (ref 65–99)
POTASSIUM: 3.1 mmol/L — AB (ref 3.5–5.1)
SODIUM: 140 mmol/L (ref 135–145)

## 2015-04-15 LAB — PROTIME-INR
INR: 1.67 — AB (ref 0.00–1.49)
Prothrombin Time: 19.7 seconds — ABNORMAL HIGH (ref 11.6–15.2)

## 2015-04-15 LAB — MAGNESIUM: Magnesium: 1.4 mg/dL — ABNORMAL LOW (ref 1.7–2.4)

## 2015-04-15 LAB — HEPARIN LEVEL (UNFRACTIONATED): Heparin Unfractionated: 0.39 IU/mL (ref 0.30–0.70)

## 2015-04-15 MED ORDER — POTASSIUM CHLORIDE CRYS ER 20 MEQ PO TBCR
40.0000 meq | EXTENDED_RELEASE_TABLET | Freq: Four times a day (QID) | ORAL | Status: AC
  Administered 2015-04-15 – 2015-04-16 (×3): 40 meq via ORAL
  Filled 2015-04-15 (×3): qty 2

## 2015-04-15 MED ORDER — MAGNESIUM SULFATE 2 GM/50ML IV SOLN
2.0000 g | Freq: Four times a day (QID) | INTRAVENOUS | Status: AC
  Administered 2015-04-15 – 2015-04-16 (×2): 2 g via INTRAVENOUS
  Filled 2015-04-15 (×2): qty 50

## 2015-04-15 MED ORDER — WARFARIN SODIUM 2.5 MG PO TABS
2.5000 mg | ORAL_TABLET | Freq: Once | ORAL | Status: AC
  Administered 2015-04-15: 2.5 mg via ORAL
  Filled 2015-04-15: qty 1

## 2015-04-15 NOTE — Progress Notes (Signed)
ANTICOAGULATION & ANTIBIOTIC CONSULT NOTE - Follow Up Consult  Pharmacy Consult for Heparin + Warfarin & Vancomycin + Fluconazole + Primaxin Indication: Hx PE & HCAP/UTI/infected nephrostomy  Patient Measurements: Height: 5\' 4"  (162.6 cm) Weight: 209 lb 7 oz (95 kg) IBW/kg (Calculated) : 59.2 Heparin Dosing Weight: 82 kg  Vital Signs: Temp: 98.2 F (36.8 C) (09/18 1000) Temp Source: Oral (09/18 1000) BP: 149/93 mmHg (09/18 1000) Pulse Rate: 97 (09/18 1000)  Labs:  Recent Labs  04/13/15 0425 04/13/15 0549 04/14/15 0718 04/15/15 0545  HGB 7.6*  --   --   --   HCT 24.7*  --   --   --   PLT 116*  --   --   --   LABPROT 16.6*  --  17.7* 19.7*  INR 1.33  --  1.45 1.67*  HEPARINUNFRC  --  0.37 0.40 0.39  CREATININE 2.00*  --  1.59* 1.81*    Estimated Creatinine Clearance: 46.8 mL/min (by C-G formula based on Cr of 1.81).   Medications:  Heparin @ 2250 units/hr (22.5 ml/hr) Warfarin Fluconazole 200 mg IV every 24 hours  9/11>> Primaxin 250 mg IV every 6 hours  9/11>> Vancomycin 1250 mg IV every 48 hours 9/12>>  Cultures: 9/8 urine:  ESBL K. pneumo 9/10: left Kidney fluid - candida tropicalis 9/10: right kidney fluid - candida tropicalis and candida glabrata 9/12:  Blood x 2>>no growth final   Assessment: 21 YOF who continues on heparin and for hx PE. Heparin level this morning is therapeutic  It is noted that the heparin drip is having to be held to give IV Vancomycin - pharmacy is aware. INR today remains SUBtherapeutic INR 1.67.The patient is on IV fluconazole which can increase sensitivity to warfarin.  The patient also continues on Vancomycin + Primaxin for HCAP. Primaxin is also covering for ESBL Klebsiella PNA UTI. Fluconazole is covering for candida species cultured from the nephrostomy fluid. Renal function is noted to be improving after the placement of nephrostomy tubes. SCr 1.81 CrCl~47 ml/min.   Goal of Therapy:  Heparin level 0.3-0.7 units/ml   Plan:   1. Continue Heparin at 2250 units/hr (22.5 ml/hr) 2. Warfarin 2.5 mg x 1 dose at 1800 today 3. Continue Vancomycin to 1250 mg IV every 24 hours (stop date entered for 9/19) 4. Continue Primaxin to 500mg  IV q8h 5. Continue Fluconazole 400 mg IV every 24 hours 6. Will continue to monitor for any signs/symptoms of bleeding and will follow up with heparin level in the a.m.  7. Will continue to follow renal function, culture results, LOT, and antibiotic de-escalation plans   Heide Guile, PharmD, BCPS-AQ ID Clinical Pharmacist Pager [phone]   04/15/2015 3:15 PM
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
## 2015-04-15 NOTE — Progress Notes (Signed)
Right nephrostomy tube leaking around insertion site and no longer into drainage bag. I reinforced the dressing. Notified Dr. Carlota Raspberry. Orders received.

## 2015-04-15 NOTE — Progress Notes (Signed)
Referring Physician(s): [place]  Chief Complaint:  Obstructive hydronephrosis  Subjective:  Bladder Ca B PCNs placed 9/10 Followed by Urology Rt now with less output/ bloody Flushes well--but no return Request for Rt PCN evaluation/poss exchange  Allergies: Tomato  Medications: Prior to Admission medications   Medication Sig Start Date End Date Taking? Authorizing Provider  acetaminophen (TYLENOL) 325 MG tablet Take 650 mg by mouth every 6 (six) hours as needed for moderate pain.   Yes Historical Provider, MD  alum & mag hydroxide-simeth (MAALOX PLUS) 400-400-40 MG/5ML suspension Take 20 mLs by mouth every 6 (six) hours as needed for indigestion.   Yes Historical Provider, MD  amLODipine (NORVASC) 5 MG tablet Take 1 tablet (5 mg total) by mouth daily. 03/27/15  Yes Bonnielee Haff, MD  atorvastatin (LIPITOR) 10 MG tablet Take 10 mg by mouth daily at 6 PM.   Yes Historical Provider, MD  baclofen (LIORESAL) 10 MG tablet Take 0.5 tablets (5 mg total) by mouth 3 (three) times daily. 03/27/15  Yes Bonnielee Haff, MD  Cholecalciferol 50000 UNITS capsule Take 50,000 Units by mouth every 30 (thirty) days.   Yes Historical Provider, MD  clonazePAM (KLONOPIN) 0.5 MG tablet Take 1/2 tablet by mouth twice daily 03/27/15  Yes Bonnielee Haff, MD  cyanocobalamin 1000 MCG tablet Take 100 mcg by mouth daily.    Yes Historical Provider, MD  docusate sodium (COLACE) 100 MG capsule Take 100 mg by mouth 2 (two) times daily.   Yes Historical Provider, MD  famotidine (PEPCID) 20 MG tablet Take 20 mg by mouth at bedtime.   Yes Historical Provider, MD  ferrous sulfate 325 (65 FE) MG EC tablet Take 325 mg by mouth 2 (two) times daily.   Yes Historical Provider, MD  folic acid (FOLVITE) 1 MG tablet Take 1 mg by mouth daily.   Yes Historical Provider, MD  guaiFENesin (ROBITUSSIN) 100 MG/5ML SOLN Take 15 mLs by mouth 3 (three) times daily as needed for cough or to loosen phlegm.   Yes Historical Provider, MD   lactose free nutrition (BOOST) LIQD Take 237 mLs by mouth 2 (two) times daily between meals.   Yes Historical Provider, MD  lactulose (CHRONULAC) 10 GM/15ML solution Take 20 g by mouth 4 (four) times daily.   Yes Historical Provider, MD  lamoTRIgine (LAMICTAL) 100 MG tablet Take 300 mg by mouth 2 (two) times daily.   Yes Historical Provider, MD  lidocaine (LIDODERM) 5 % Place 2 patches onto the skin as needed (for pain). Remove & Discard patch within 12 hours or as directed by MD (apply 2 patches to left stump daily at 9pm   Yes Historical Provider, MD  linezolid (ZYVOX) 600 MG tablet Take 1 tablet (600 mg total) by mouth every 12 (twelve) hours. For 10 more days Patient taking differently: Take 600 mg by mouth every 12 (twelve) hours. Started therapy unknown. Finish therapy on 04-07-15 per [facility] 03/27/15  Yes Bonnielee Haff, MD  metoprolol tartrate (LOPRESSOR) 25 MG tablet Take 1 tablet (25 mg total) by mouth 2 (two) times daily. 09/28/14  Yes Orson Eva, MD  omeprazole (PRILOSEC) 20 MG capsule Take 20 mg by mouth daily.   Yes Historical Provider, MD  ondansetron (ZOFRAN) 4 MG tablet Take 4 mg by mouth every 8 (eight) hours as needed for nausea or vomiting.   Yes Historical Provider, MD  oxybutynin (DITROPAN-XL) 5 MG 24 hr tablet Take 1 tablet (5 mg total) by mouth every morning. 03/27/15  Yes  Bonnielee Haff, MD  Polyethyl Glycol-Propyl Glycol (SYSTANE) 0.4-0.3 % SOLN Apply 2 drops to eye 3 (three) times daily.   Yes Historical Provider, MD  pregabalin (LYRICA) 25 MG capsule Take 25 mg by mouth 3 (three) times daily.   Yes Historical Provider, MD  promethazine (PHENERGAN) 25 MG tablet Take 25 mg by mouth every 8 (eight) hours as needed for nausea or vomiting.   Yes Historical Provider, MD  promethazine (PHENERGAN) 25 MG/ML injection Inject 25 mg into the muscle every 4 (four) hours as needed for nausea or vomiting.   Yes Historical Provider, MD  QUEtiapine (SEROQUEL) 300 MG tablet Take 300  mg by mouth at bedtime.   Yes Historical Provider, MD  senna (SENOKOT) 8.6 MG tablet Take 2 tablets by mouth every morning.   Yes Historical Provider, MD  silver sulfADIAZINE (SILVADENE) 1 % cream Apply topically 2 (two) times daily. 03/27/15  Yes Bonnielee Haff, MD  traZODone (DESYREL) 50 MG tablet Take 1 tablet (50 mg total) by mouth at bedtime. 03/27/15  Yes Bonnielee Haff, MD  warfarin (COUMADIN) 2.5 MG tablet Take 2.5 mg by mouth daily.   Yes Historical Provider, MD  lamoTRIgine (LAMICTAL) 200 MG tablet Take 0.5 tablets (100 mg total) by mouth 2 (two) times daily. Patient not taking: Reported on 04/03/2015 02/22/15   Nishant Dhungel, MD  nitroGLYCERIN (NITROSTAT) 0.4 MG SL tablet Place 0.4 mg under the tongue every 5 (five) minutes as needed for chest pain.    Historical Provider, MD  oxyCODONE (OXY IR/ROXICODONE) 5 MG immediate release tablet Take 1 tablet (5 mg total) by mouth every 4 (four) hours as needed for moderate pain or severe pain. Patient not taking: Reported on 04/03/2015 03/27/15   Bonnielee Haff, MD     Vital Signs: BP 138/86 mmHg  Pulse 95  Temp(Src) 99.2 F (37.3 C) (Oral)  Resp 20  Ht 5\' 4"  (1.626 m)  Wt 209 lb 7 oz (95 kg)  BMI 35.93 kg/m2  SpO2 96%  Physical Exam  Skin: Skin is warm.  Rt PCN intact Bloody at site--seems to come from suture site Site has no sign of infection Sutures intact Output bloody 900 cc recorded yesterday; 20 cc in bag now Flushes well; not able to aspirate  Lt PCN intact Site clean and dry No infection Output yellow urine 2 liters yesterday; 200 cc in bag now  Cr 1.8 today  Nursing note and vitals reviewed.   Imaging: No results found.  Labs:  CBC:  Recent Labs  04/10/15 0412 04/11/15 0526 04/12/15 0922 04/13/15 0425  WBC 11.9* 11.6* 11.3* 11.0*  HGB 9.3* 8.1* 8.2* 7.6*  HCT 28.8* 25.1* 26.2* 24.7*  PLT 120* 112* 119* 116*    COAGS:  Recent Labs  09/26/14 1055  11/10/14 0855 11/28/14 1330  02/18/15 0025   04/12/15 0922 04/13/15 0425 04/14/15 0718 04/15/15 0545  INR 1.14  < > 1.21 1.21  < > 3.54*  < > 1.27 1.33 1.45 1.67*  APTT 32  --  27 37  --  52*  --   --   --   --   --   < > = values in this interval not displayed.  BMP:  Recent Labs  04/12/15 0922 04/13/15 0425 04/14/15 0718 04/15/15 0545  NA 145 143 148* 140  K 2.5* 2.3* 2.6* 3.1*  CL 106 100* 103 97*  CO2 28 31 35* 33*  GLUCOSE 139* 155* 142* 125*  BUN 35* 27* 16 16  CALCIUM  7.7* 7.6* 7.5* 7.4*  CREATININE 2.47* 2.00* 1.59* 1.81*  GFRNONAA 27* 35* 47* 40*  GFRAA 32* 41* 54* 46*    LIVER FUNCTION TESTS:  Recent Labs  03/25/15 1149 03/26/15 0400 04/03/15 2105 04/04/15 0225 04/05/15 0453 04/06/15 0449  BILITOT 0.2* 0.4 0.1* 0.5  --   --   AST 16 12* 12* 12*  --   --   ALT 8* 7* 8* 8*  --   --   ALKPHOS 75 68 97 92  --   --   PROT 6.5 6.5 7.1 6.8  --   --   ALBUMIN 2.1* 2.0* 2.4* 2.3* 2.1* 2.3*    Assessment and Plan:  B PCN placed 04/07/15 Obstructive hydronephrosis Bladder Ca Rt PCN flushing well--cannot aspirate Also output bloody now---for drain injection and poss exchange 9/19 in IR   Signed: TURPIN,PAMELA A 04/15/2015, 8:36 AM   I spent a total of 15 Minutes at the the patient's bedside AND on the patient's hospital floor or unit, greater than 50% of which was counseling/coordinating care for B PCN

## 2015-04-15 NOTE — Progress Notes (Signed)
[facility] PROGRESS NOTE  Samuel [name] HUD:[id number] DOB: 1955-10-19 DOA: 04/03/2015  PCP: [name], MD  Brief HPI: 59 year old Caucasian male well known to our service for numerous hospitalizations. He was discharged about a week ago after being managed for acute renal failure. He lives in a skilled nursing facility. He was sent over to the emergency department for seizure type activity. Evaluation revealed worsening in his renal function. He was hospitalized for further management. He underwent bilateral nephrostomy by interventional radiology. Subsequently, he aspirated and went into acute respiratory failure with hypoxia. He is encephalopathic. Discussions with his daughters were held. He was made DO NOT RESUSCITATE. Renal function is improving.   Consultants: Nephrology, urology, neurology. Interventional radiology, palliative and hospice medicine.  Procedures: Bilateral percutaneous nephrostomies 9/10  Antibiotics: Primaxin  Subjective: Sleepy today, right PCN tube has some blood, seen by IR and plans for drainage injection and possible exchange in a.m. noted. Palliative consulted for goals of care, meeting with the family today at 4 PM.  Objective: Vital Signs  Filed Vitals:   04/14/15 1729 04/14/15 2032 04/15/15 0514 04/15/15 1000  BP: 136/83 136/79 138/86 149/93  Pulse: 94 103 95 97  Temp: 98 F (36.7 C) 98 F (36.7 C) 99.2 F (37.3 C) 98.2 F (36.8 C)  TempSrc: Oral   Oral  Resp: 19 19 20 19   Height:      Weight:  95 kg (209 lb 7 oz)    SpO2: 100% 91% 96% 96%    Intake/Output Summary (Last 24 hours) at 04/15/15 1343 Last data filed at 04/15/15 1300  Gross per 24 hour  Intake   2860 ml  Output   3160 ml  Net   -300 ml   Filed Weights   04/11/15 2013 04/13/15 0627 04/14/15 2032  Weight: 99.2 kg (218 lb 11.1 oz) 98.7 kg (217 lb 9.5 oz) 95 kg (209 lb 7 oz)    General appearance: Drowsy but arousable. Does not follow commands. Resp:  Very diminished air entry on the right. Crackles heard. No rhonchi.  Cardio: regular rate and rhythm, S1, S2 normal, no murmur, click, rub or gallop GI: soft, non-tender; bowel sounds normal; no masses,  no organomegaly Neurologic: He is a known paraplegic. Involuntary movement of his upper extremities noted occasionally. Not conclusive for generalized clonic tonic activity. Delirious.  Lab Results: Results for orders placed or performed during the hospital encounter of 04/03/15  Culture, Urine     Status: None   Collection Time: 04/05/15  6:38 PM  Result Value Ref Range Status   Specimen Description URINE, CATHETERIZED  Final   Special Requests NONE  Final   Culture   Final    >=100,000 COLONIES/mL KLEBSIELLA PNEUMONIAE Confirmed Extended Spectrum Beta-Lactamase Producer (ESBL)    Report Status 04/08/2015 FINAL  Final   Organism ID, Bacteria KLEBSIELLA PNEUMONIAE  Final      Susceptibility   Klebsiella pneumoniae - MIC*    AMPICILLIN >=32 RESISTANT Resistant     CEFAZOLIN >=64 RESISTANT Resistant     CEFTRIAXONE >=64 RESISTANT Resistant     CIPROFLOXACIN >=4 RESISTANT Resistant     GENTAMICIN >=16 RESISTANT Resistant     IMIPENEM <=0.25 SENSITIVE Sensitive     NITROFURANTOIN 256 RESISTANT Resistant     TRIMETH/SULFA >=320 RESISTANT Resistant     AMPICILLIN/SULBACTAM >=32 RESISTANT Resistant     PIP/TAZO 32 INTERMEDIATE Intermediate     * >=100,000 COLONIES/mL KLEBSIELLA PNEUMONIAE  Culture, body fluid-bottle     Status:  None   Collection Time: 04/07/15  9:33 AM  Result Value Ref Range Status   Specimen Description FLUID RIGHT KIDNEY  Final   Special Requests NONE  Final   Gram Stain YEAST IN BOTH AEROBIC AND ANAEROBIC BOTTLES   Final   Culture CANDIDA TROPICALIS CANDIDA GLABRATA   Final   Report Status 04/11/2015 FINAL  Final  Gram stain     Status: None   Collection Time: 04/07/15  9:33 AM  Result Value Ref Range Status   Specimen Description FLUID RIGHT KIDNEY  Final     Special Requests NONE  Final   Gram Stain   Final    FEW WBC PRESENT,BOTH PMN AND MONONUCLEAR FEW YEAST    Report Status 04/07/2015 FINAL  Final  Culture, body fluid-bottle     Status: None   Collection Time: 04/07/15  9:33 AM  Result Value Ref Range Status   Specimen Description FLUID KIDNEY LEFT  Final   Special Requests NONE  Final   Gram Stain   Final    YEAST HYPHAL ELEMENTS SEEN IN BOTH AEROBIC AND ANAEROBIC BOTTLES CRITICAL RESULT CALLED TO, READ BACK BY AND VERIFIED WITH: K WICKER @1128  04/07/15 MKELLY    Culture CANDIDA TROPICALIS  Final   Report Status 04/12/2015 FINAL  Final  Gram stain     Status: None   Collection Time: 04/07/15  9:33 AM  Result Value Ref Range Status   Specimen Description FLUID KIDNEY LEFT  Final   Special Requests NONE  Final   Gram Stain   Final    ABUNDANT WBC PRESENT,BOTH PMN AND MONONUCLEAR FEW YEAST    Report Status 04/07/2015 FINAL  Final  Culture, blood (routine x 2)     Status: None   Collection Time: 04/09/15  3:23 AM  Result Value Ref Range Status   Specimen Description BLOOD RIGHT HAND  Final   Special Requests BOTTLES DRAWN AEROBIC AND ANAEROBIC 10CC  Final   Culture NO GROWTH 5 DAYS  Final   Report Status 04/14/2015 FINAL  Final  Culture, blood (routine x 2)     Status: None   Collection Time: 04/09/15  3:36 AM  Result Value Ref Range Status   Specimen Description BLOOD LEFT HAND  Final   Special Requests BOTTLES DRAWN AEROBIC AND ANAEROBIC 10CC  Final   Culture NO GROWTH 5 DAYS  Final   Report Status 04/14/2015 FINAL  Final    Basic Metabolic Panel:  Recent Labs Lab 04/11/15 0526 04/12/15 0922 04/13/15 0425 04/14/15 0718 04/15/15 0545  NA 144 145 143 148* 140  K 2.5* 2.5* 2.3* 2.6* 3.1*  CL 109 106 100* 103 97*  CO2 23 28 31  35* 33*  GLUCOSE 135* 139* 155* 142* 125*  BUN 52* 35* 27* 16 16  CREATININE 3.49* 2.47* 2.00* 1.59* 1.81*  CALCIUM 7.7* 7.7* 7.6* 7.5* 7.4*  MG  --   --  2.2  --  1.4*   Liver  Function Tests: No results for input(s): AST, ALT, ALKPHOS, BILITOT, PROT, ALBUMIN in the last 168 hours. CBC:  Recent Labs Lab 04/09/15 0323 04/10/15 0412 04/11/15 0526 04/12/15 0922 04/13/15 0425  WBC 12.7* 11.9* 11.6* 11.3* 11.0*  HGB 9.7* 9.3* 8.1* 8.2* 7.6*  HCT 30.0* 28.8* 25.1* 26.2* 24.7*  MCV 84.0 82.8 82.6 84.0 84.9  PLT 121* 120* 112* 119* 116*     Studies/Results: No results found.  Medications:  Scheduled: . sodium chloride   Intravenous Once  . baclofen  5  mg Oral TID  . [START ON 05/02/2015] Cholecalciferol  50,000 Units Oral Q30 days  . feeding supplement (ENSURE ENLIVE)  237 mL Oral BID BM  . fluconazole (DIFLUCAN) IV  400 mg Intravenous Q24H  . guaiFENesin  1,200 mg Oral BID  . imipenem-cilastatin  500 mg Intravenous 3 times per day  . lamoTRIgine  200 mg Oral BID  . magnesium sulfate 1 - 4 g bolus IVPB  2 g Intravenous Q6H  . metoprolol tartrate  25 mg Oral BID  . pantoprazole  40 mg Oral Daily  . polyvinyl alcohol  2 drop Both Eyes TID  . potassium chloride  40 mEq Oral Q6H  . silver sulfADIAZINE   Topical BID  . sodium chloride  3 mL Intravenous Q12H  . vancomycin  1,250 mg Intravenous Q24H  . Warfarin - Pharmacist Dosing Inpatient   Does not apply q1800   Continuous: . dextrose 5 % 1,000 mL with potassium chloride 40 mEq, sodium bicarbonate 150 mEq infusion 100 mL/hr at 04/14/15 1103  . heparin 2,250 Units/hr (04/14/15 0553)   MEQ:[id number] **OR** acetaminophen, guaiFENesin-dextromethorphan, hydrALAZINE, iohexol, LORazepam, morphine injection, nitroGLYCERIN, ondansetron (ZOFRAN) IV, RESOURCE THICKENUP CLEAR, sodium chloride  Assessment/Plan:  Principal Problem:   ARF (acute renal failure) Active Problems:   Acute encephalopathy   Seizures   Anemia   Hyponatremia   Acute renal failure syndrome   Seizure   Chronic anticoagulation   Essential hypertension   Obstructive uropathy   Infection due to ESBL-producing Klebsiella  pneumoniae   Acute respiratory failure with hypoxia   Aspiration pneumonia    Acute hypoxic respiratory failure secondary to aspiration pneumonia versus healthcare associated pneumonia with sepsis Patient's chest x-ray shows extensive infiltrate in the right lung. Most likely this is secondary to aspiration.  On Primaxin and vancomycin. Significant decline along with aspiration, made DO NOT RESUSCITATE by family. Probably will benefit from goals of care meeting in the nursing home after discharge. I spoke with his daughter [name], she leans toward the idea of palliative/hospice, she wants to talk to palliative care Last oncology evaluation on 03/26/2015 suggesting it will be reasonable to consider palliative medicine evaluation.  Acute renal failure/obstructive uropathy Status post bilateral percutaneous nephrostomy tube placement by IR. Renal function slowly improving.  At the time of admission Ultrasound showed worsening hydronephrosis.  He has a known history of bladder cancer with metastases.  Right PCN tube has some bloody return, IR to evaluate.  Seizure disorder Apparently had an episode of seizure at the SNF. Seen by neurology. They think that his presentation is likely due to toxicity from his antiepileptics drugs. They have decreased the dose of his Lamictal. EEG report from 9/7 is reviewed. MRI brain report as above.   Acute encephalopathy Encephalopathy is likely due to a combination of renal failure, medications, seizure activity, and hypoxia. Ammonia level is normal. We have cutback on his sedative agents. Unfortunately now due to acute respiratory failure don't anticipate his mental status to improve anytime soon.   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 CT on previous admission showed stable bilateral hydronephrosis. Ultrasound from this  hospitalization shows worsening hydronephrosis.  Last encounter with oncology, they recommend to be evaluated by palliative/hospice medicine.  Yeast in fluid from nephrostomy Currently on Diflucan. Candida tropicalis identified.  ESBL Klebsiella pneumoniae/Recent VRE UTI Urine cultures from 9/8 growing ESBL Klebsiella. Start imipenem.  Patient was on linezolid  till 9/10 for VRE. WBC noted to be elevated on 9/10. Better today.   Essential hypertension, poorly controlled Blood pressure was noted to be elevated. Probably due to his agitation. Continue current medications.   History of PE on chronic anticoagulation Patient is on warfarin as outpatient. This was held due to need for possible urological procedure. His PE was in 2015. Anticoagulation was held for his procedures. Resumed IV heparin.  I have asked pharmacy to restart Coumadin, if his renal function can tolerate him I put him on DOAC.  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 CT scan 7/24 showeddecubitus ulcer involving the right buttock with a tractextending upward to the sacrum, with evidence of sacral osteomyelitis, although this was unchanged from before. Could have chronic osteomyelitis. Bone scan was done during previous hospitalization which ruled out osteomyelitis.   Iron deficiency anemia Hemoglobin responded to blood transfusion. No overt bleeding was identified. After his procedure, some blood noted in the nephrostomy bags, but it appears to be clearing up. Hemoglobin is stable. Continue to monitor.   Hypokalemia Severe hypokalemia with potassium of 2.5, replete with oral supplements. Patient did not take the oral potassium yesterday, potassium went down to 2.3. Patent tube and potassium per tube  Dysphagia Patient did have acute respiratory failure from presumed aspiration pneumonia, had to be intubated and mechanically ventilated. Patient is more awake and alert, seen by SLP and recommended dysphagia 2 with nectar thick  liquids.  DVT Prophylaxis: intravenous heparin Code Status: DO NOT RESUSCITATE Family Communication: Discussed with both his daughters [name] and [name]. Disposition Plan: Patient is declining. He may not survive this hospitalization. Family wants to continue active treatment for now.      LOS: 12 days   [facility] Pager [phone]  04/15/2015, 1:43 PM  If 7PM-7AM, please contact night-coverage at [email or web address], password [facility]

## 2015-04-15 NOTE — Progress Notes (Signed)
Right nephrostomy tube is still not draining after flushing with 10cc NS per MD order. Will make pt NPO for IR later today.

## 2015-04-15 NOTE — Progress Notes (Addendum)
  Right PCN clogged vs dislodged, flushed at bedside, no return of urine.   Filed Vitals:   04/15/15 0514  BP: 138/86  Pulse: 95  Temp: 99.2 F (37.3 C)  Resp: 20    Intake/Output Summary (Last 24 hours) at 04/15/15 0739 Last data filed at 04/15/15 0300  Gross per 24 hour  Intake   3360 ml  Output   3160 ml  Net    200 ml    PE: Pt alert, knows I'm his "doctor" Perc tubes with good UOP on left, clogged on right Small volume urine in foley now, too. Clear.  IMP/Plan  - Physiologic vs pathologic post obstructive diuresis, make sure to monitor electrolytes for derangement.  - Leave PCNs to drainage. - Order in for PCN exchange on the right -  Leave foley in place for now.  - Appreciate great hospitalist care.  - Will follow.   Attending attestation: Patient seen and examined with Dr. Carlota Raspberry. I agree with his assessment and plan. Some urine in foley bag maybe from right kidney (clear).

## 2015-04-16 ENCOUNTER — Inpatient Hospital Stay (HOSPITAL_COMMUNITY): Payer: Medicare Other

## 2015-04-16 LAB — CBC
HCT: 23.7 % — ABNORMAL LOW (ref 39.0–52.0)
Hemoglobin: 7 g/dL — ABNORMAL LOW (ref 13.0–17.0)
MCH: 26.1 pg (ref 26.0–34.0)
MCHC: 29.5 g/dL — ABNORMAL LOW (ref 30.0–36.0)
MCV: 88.4 fL (ref 78.0–100.0)
PLATELETS: 135 10*3/uL — AB (ref 150–400)
RBC: 2.68 MIL/uL — AB (ref 4.22–5.81)
RDW: 19.4 % — ABNORMAL HIGH (ref 11.5–15.5)
WBC: 12 10*3/uL — AB (ref 4.0–10.5)

## 2015-04-16 LAB — BASIC METABOLIC PANEL
ANION GAP: 9 (ref 5–15)
BUN: 19 mg/dL (ref 6–20)
CHLORIDE: 99 mmol/L — AB (ref 101–111)
CO2: 34 mmol/L — AB (ref 22–32)
Calcium: 8.2 mg/dL — ABNORMAL LOW (ref 8.9–10.3)
Creatinine, Ser: 2.15 mg/dL — ABNORMAL HIGH (ref 0.61–1.24)
GFR calc non Af Amer: 32 mL/min — ABNORMAL LOW (ref >60)
GFR, EST AFRICAN AMERICAN: 37 mL/min — AB (ref >60)
Glucose, Bld: 117 mg/dL — ABNORMAL HIGH (ref 65–99)
Potassium: 5 mmol/L (ref 3.5–5.1)
Sodium: 142 mmol/L (ref 135–145)

## 2015-04-16 LAB — HEPARIN LEVEL (UNFRACTIONATED): Heparin Unfractionated: 0.5 IU/mL (ref 0.30–0.70)

## 2015-04-16 LAB — MAGNESIUM: MAGNESIUM: 2.5 mg/dL — AB (ref 1.7–2.4)

## 2015-04-16 LAB — PROTIME-INR
INR: 1.85 — AB (ref 0.00–1.49)
PROTHROMBIN TIME: 21.3 s — AB (ref 11.6–15.2)

## 2015-04-16 MED ORDER — SODIUM CHLORIDE 0.9 % IV SOLN
INTRAVENOUS | Status: DC
  Administered 2015-04-16 (×2): via INTRAVENOUS

## 2015-04-16 MED ORDER — WARFARIN SODIUM 2.5 MG PO TABS
2.5000 mg | ORAL_TABLET | Freq: Once | ORAL | Status: AC
  Administered 2015-04-16: 2.5 mg via ORAL
  Filled 2015-04-16: qty 1

## 2015-04-16 MED ORDER — IOHEXOL 300 MG/ML  SOLN
50.0000 mL | Freq: Once | INTRAMUSCULAR | Status: DC | PRN
  Administered 2015-04-16: 15 mL
  Filled 2015-04-16: qty 50

## 2015-04-16 MED ORDER — MORPHINE SULFATE (PF) 4 MG/ML IV SOLN
INTRAVENOUS | Status: AC
  Filled 2015-04-16: qty 1

## 2015-04-16 MED ORDER — LIDOCAINE HCL 1 % IJ SOLN
INTRAMUSCULAR | Status: AC
  Filled 2015-04-16: qty 20

## 2015-04-16 NOTE — Progress Notes (Signed)
[facility] PROGRESS NOTE  Samuel [name] JHE:[id number] DOB: May 25, 1956 DOA: 04/03/2015  PCP: [name], MD  Brief HPI: 59 year old Caucasian male well known to our service for numerous hospitalizations. He was discharged about a week ago after being managed for acute renal failure. He lives in a skilled nursing facility. He was sent over to the emergency department for seizure type activity. Evaluation revealed worsening in his renal function. He was hospitalized for further management. He underwent bilateral nephrostomy by interventional radiology. Subsequently, he aspirated and went into acute respiratory failure with hypoxia. He is encephalopathic. Discussions with his daughters were held. He was made DO NOT RESUSCITATE. Renal function is improving.   Consultants: Nephrology, urology, neurology. Interventional radiology, palliative and hospice medicine.  Procedures: Bilateral percutaneous nephrostomies 9/10  Antibiotics: Primaxin  Subjective: Right PCN tube still has some blood, IR to check the right PCN tube for possible placement. Discussed with Dr. Domingo Cocking, patient will go to SNF with palliative care.  Objective: Vital Signs  Filed Vitals:   04/15/15 1840 04/15/15 2231 04/16/15 0638 04/16/15 1000  BP: 132/59 93/79 135/78 132/57  Pulse: 90 103 72 85  Temp: 97.9 F (36.6 C) 98.8 F (37.1 C) 97.5 F (36.4 C) 98.4 F (36.9 C)  TempSrc: Oral Axillary Oral Oral  Resp: 18 18 18 16   Height:      Weight:  100.7 kg (222 lb 0.1 oz)    SpO2: 100% 95% 100% 100%    Intake/Output Summary (Last 24 hours) at 04/16/15 1239 Last data filed at 04/16/15 1028  Gross per 24 hour  Intake    180 ml  Output   3900 ml  Net  -3720 ml   Filed Weights   04/13/15 0627 04/14/15 2032 04/15/15 2231  Weight: 98.7 kg (217 lb 9.5 oz) 95 kg (209 lb 7 oz) 100.7 kg (222 lb 0.1 oz)    General appearance: Drowsy but arousable. Does not follow commands. Resp: Very diminished air  entry on the right. Crackles heard. No rhonchi.  Cardio: regular rate and rhythm, S1, S2 normal, no murmur, click, rub or gallop GI: soft, non-tender; bowel sounds normal; no masses,  no organomegaly Neurologic: He is a known paraplegic. Involuntary movement of his upper extremities noted occasionally. Not conclusive for generalized clonic tonic activity. Delirious.  Lab Results: Results for orders placed or performed during the hospital encounter of 04/03/15  Culture, Urine     Status: None   Collection Time: 04/05/15  6:38 PM  Result Value Ref Range Status   Specimen Description URINE, CATHETERIZED  Final   Special Requests NONE  Final   Culture   Final    >=100,000 COLONIES/mL KLEBSIELLA PNEUMONIAE Confirmed Extended Spectrum Beta-Lactamase Producer (ESBL)    Report Status 04/08/2015 FINAL  Final   Organism ID, Bacteria KLEBSIELLA PNEUMONIAE  Final      Susceptibility   Klebsiella pneumoniae - MIC*    AMPICILLIN >=32 RESISTANT Resistant     CEFAZOLIN >=64 RESISTANT Resistant     CEFTRIAXONE >=64 RESISTANT Resistant     CIPROFLOXACIN >=4 RESISTANT Resistant     GENTAMICIN >=16 RESISTANT Resistant     IMIPENEM <=0.25 SENSITIVE Sensitive     NITROFURANTOIN 256 RESISTANT Resistant     TRIMETH/SULFA >=320 RESISTANT Resistant     AMPICILLIN/SULBACTAM >=32 RESISTANT Resistant     PIP/TAZO 32 INTERMEDIATE Intermediate     * >=100,000 COLONIES/mL KLEBSIELLA PNEUMONIAE  Culture, body fluid-bottle     Status: None   Collection Time: 04/07/15  9:33 AM  Result Value Ref Range Status   Specimen Description FLUID RIGHT KIDNEY  Final   Special Requests NONE  Final   Gram Stain YEAST IN BOTH AEROBIC AND ANAEROBIC BOTTLES   Final   Culture CANDIDA TROPICALIS CANDIDA GLABRATA   Final   Report Status 04/11/2015 FINAL  Final  Gram stain     Status: None   Collection Time: 04/07/15  9:33 AM  Result Value Ref Range Status   Specimen Description FLUID RIGHT KIDNEY  Final   Special Requests  NONE  Final   Gram Stain   Final    FEW WBC PRESENT,BOTH PMN AND MONONUCLEAR FEW YEAST    Report Status 04/07/2015 FINAL  Final  Culture, body fluid-bottle     Status: None   Collection Time: 04/07/15  9:33 AM  Result Value Ref Range Status   Specimen Description FLUID KIDNEY LEFT  Final   Special Requests NONE  Final   Gram Stain   Final    YEAST HYPHAL ELEMENTS SEEN IN BOTH AEROBIC AND ANAEROBIC BOTTLES CRITICAL RESULT CALLED TO, READ BACK BY AND VERIFIED WITH: K WICKER @1128  04/07/15 MKELLY    Culture CANDIDA TROPICALIS  Final   Report Status 04/12/2015 FINAL  Final  Gram stain     Status: None   Collection Time: 04/07/15  9:33 AM  Result Value Ref Range Status   Specimen Description FLUID KIDNEY LEFT  Final   Special Requests NONE  Final   Gram Stain   Final    ABUNDANT WBC PRESENT,BOTH PMN AND MONONUCLEAR FEW YEAST    Report Status 04/07/2015 FINAL  Final  Culture, blood (routine x 2)     Status: None   Collection Time: 04/09/15  3:23 AM  Result Value Ref Range Status   Specimen Description BLOOD RIGHT HAND  Final   Special Requests BOTTLES DRAWN AEROBIC AND ANAEROBIC 10CC  Final   Culture NO GROWTH 5 DAYS  Final   Report Status 04/14/2015 FINAL  Final  Culture, blood (routine x 2)     Status: None   Collection Time: 04/09/15  3:36 AM  Result Value Ref Range Status   Specimen Description BLOOD LEFT HAND  Final   Special Requests BOTTLES DRAWN AEROBIC AND ANAEROBIC 10CC  Final   Culture NO GROWTH 5 DAYS  Final   Report Status 04/14/2015 FINAL  Final    Basic Metabolic Panel:  Recent Labs Lab 04/12/15 0922 04/13/15 0425 04/14/15 0718 04/15/15 0545 04/16/15 0614  NA 145 143 148* 140 142  K 2.5* 2.3* 2.6* 3.1* 5.0  CL 106 100* 103 97* 99*  CO2 28 31 35* 33* 34*  GLUCOSE 139* 155* 142* 125* 117*  BUN 35* 27* 16 16 19   CREATININE 2.47* 2.00* 1.59* 1.81* 2.15*  CALCIUM 7.7* 7.6* 7.5* 7.4* 8.2*  MG  --  2.2  --  1.4* 2.5*   Liver Function Tests: No results  for input(s): AST, ALT, ALKPHOS, BILITOT, PROT, ALBUMIN in the last 168 hours. CBC:  Recent Labs Lab 04/10/15 0412 04/11/15 0526 04/12/15 0922 04/13/15 0425  WBC 11.9* 11.6* 11.3* 11.0*  HGB 9.3* 8.1* 8.2* 7.6*  HCT 28.8* 25.1* 26.2* 24.7*  MCV 82.8 82.6 84.0 84.9  PLT 120* 112* 119* 116*     Studies/Results: No results found.  Medications:  Scheduled: . baclofen  5 mg Oral TID  . [START ON 05/02/2015] Cholecalciferol  50,000 Units Oral Q30 days  . feeding supplement (ENSURE ENLIVE)  237 mL Oral  BID BM  . fluconazole (DIFLUCAN) IV  400 mg Intravenous Q24H  . guaiFENesin  1,200 mg Oral BID  . imipenem-cilastatin  500 mg Intravenous 3 times per day  . lamoTRIgine  200 mg Oral BID  . metoprolol tartrate  25 mg Oral BID  . pantoprazole  40 mg Oral Daily  . polyvinyl alcohol  2 drop Both Eyes TID  . potassium chloride  40 mEq Oral Q6H  . silver sulfADIAZINE   Topical BID  . sodium chloride  3 mL Intravenous Q12H  . vancomycin  1,250 mg Intravenous Q24H  . warfarin  2.5 mg Oral ONCE-1800  . Warfarin - Pharmacist Dosing Inpatient   Does not apply q1800   Continuous: . sodium chloride 75 mL/hr at 04/16/15 1007  . heparin 2,250 Units/hr (04/16/15 0250)   SHF:WYOVZCHYIFOYD **OR** acetaminophen, guaiFENesin-dextromethorphan, hydrALAZINE, iohexol, LORazepam, morphine injection, nitroGLYCERIN, ondansetron (ZOFRAN) IV, RESOURCE THICKENUP CLEAR, sodium chloride  Assessment/Plan:  Principal Problem:   ARF (acute renal failure) Active Problems:   Acute encephalopathy   Seizures   Anemia   Hyponatremia   Acute renal failure syndrome   Seizure   Chronic anticoagulation   Essential hypertension   Obstructive uropathy   Infection due to ESBL-producing Klebsiella pneumoniae   Acute respiratory failure with hypoxia   Aspiration pneumonia    Acute hypoxic respiratory failure secondary to aspiration pneumonia versus healthcare associated pneumonia with sepsis Patient's chest  x-ray shows extensive infiltrate in the right lung. Most likely this is secondary to aspiration.  On Primaxin and vancomycin. Significant decline along with aspiration, made DO NOT RESUSCITATE by family. Probably will benefit from goals of care meeting in the nursing home after discharge. I spoke with his daughter [name], she leans toward the idea of palliative/hospice, she wants to talk to palliative care Last oncology evaluation on 03/26/2015 suggesting it will be reasonable to consider palliative medicine evaluation. Palliative care met with the family and recommended SNF with palliative care follow-up.  Acute renal failure/obstructive uropathy Status post bilateral percutaneous nephrostomy tube placement by IR. Renal function slowly improving.  At the time of admission Ultrasound showed worsening hydronephrosis.  He has a known history of bladder cancer with metastases.  Right PCN tube has some bloody return, IR to possible replacement today.  ESBL Klebsiella pneumoniae/Recent VRE UTI Urine cultures from 9/8 growing ESBL Klebsiella. Start imipenem.  Patient was on linezolid till 9/10 for VRE.  Patient is on Primaxin since 04/08/2015, will discontinue today after the PCN tube replacement.  Seizure disorder Apparently had an episode of seizure at the SNF. Seen by neurology. They think that his presentation is likely due to toxicity from his antiepileptics drugs. They have decreased the dose of his Lamictal. EEG report from 9/7 is reviewed. MRI brain report as above.   Acute encephalopathy Encephalopathy is likely due to a combination of renal failure, medications, seizure activity, and hypoxia. Ammonia level is normal. We have cutback on his sedative agents. Unfortunately now due to acute respiratory failure don't anticipate his mental status to improve anytime soon.   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 CT on previous admission showed stable bilateral hydronephrosis. Ultrasound from this hospitalization shows worsening hydronephrosis.  Last encounter with oncology, they recommend to be evaluated by palliative/hospice medicine.  Yeast in fluid from nephrostomy Currently on Diflucan. Candida tropicalis identified.  Essential hypertension, poorly controlled Blood pressure was noted to be  elevated. Probably due to his agitation. Continue current medications.   History of PE on chronic anticoagulation Patient is on warfarin as outpatient. This was held due to need for possible urological procedure. His PE was in 2015. Anticoagulation was held for his procedures. Resumed IV heparin.  I have asked pharmacy to restart Coumadin, if his renal function can tolerate him I put him on DOAC.  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 CT scan 7/24 showeddecubitus ulcer involving the right buttock with a tractextending upward to the sacrum, with evidence of sacral osteomyelitis, although this was unchanged from before. Could have chronic osteomyelitis. Bone scan was done during previous hospitalization which ruled out osteomyelitis.   Iron deficiency anemia Hemoglobin responded to blood transfusion. No overt bleeding was identified. After his procedure, some blood noted in the nephrostomy bags, but it appears to be clearing up. Hemoglobin is stable. Continue to monitor.   Hypokalemia Severe hypokalemia with potassium of 2.5, replete with oral supplements. This is replaced by oral and parenteral supplements, check BMP in a.m.  Dysphagia Patient did have acute respiratory failure from presumed aspiration pneumonia, had to be intubated and mechanically ventilated. Patient is more awake and alert, seen by SLP and recommended dysphagia 2 with nectar thick liquids.  DVT Prophylaxis: intravenous heparin Code Status: DO NOT RESUSCITATE Family Communication: Discussed  with both his daughters [name] and [name]. Disposition Plan: Patient is declining. He may not survive this hospitalization. Family wants to continue active treatment for now.      LOS: 13 days   [facility] Pager [phone]  04/16/2015, 12:39 PM  If 7PM-7AM, please contact night-coverage at [email or web address], password [facility]

## 2015-04-16 NOTE — Procedures (Signed)
Interventional Radiology Procedure Note  Procedure:  Image guided exchange of right PCN for blocked tube.   Findings:  Sero-sanguinous fluid in the collecting system, with ill-defined filling defect in the pelvis compatible with thrombus.   Complications: None. Recommendations:  -  Routine exchange in 2-3 months - if H&H drops and urine does not clear, may consider CTA of abdomen to investigate for potential vessel involvement.    Signed,  Dulcy Fanny. Earleen Newport, DO

## 2015-04-16 NOTE — Progress Notes (Signed)
ANTICOAGULATION CONSULT NOTE - Follow Up Consult  Pharmacy Consult for heparin and warfarin Indication: hx PE  Allergies  Allergen Reactions  . Tomato Other (See Comments)    Causes acid reflux    Patient Measurements: Height: 5\' 4"  (162.6 cm) Weight: 222 lb 0.1 oz (100.7 kg) IBW/kg (Calculated) : 59.2 Heparin Dosing Weight: 82 kg  Vital Signs: Temp: 98.4 F (36.9 C) (09/19 1000) Temp Source: Oral (09/19 1000) BP: 132/57 mmHg (09/19 1000) Pulse Rate: 85 (09/19 1000)  Labs:  Recent Labs  04/14/15 0718 04/15/15 0545 04/16/15 0526 04/16/15 0614  LABPROT 17.7* 19.7* 21.3*  --   INR 1.45 1.67* 1.85*  --   HEPARINUNFRC 0.40 0.39 0.50  --   CREATININE 1.59* 1.81*  --  2.15*    Estimated Creatinine Clearance: 40.6 mL/min (by C-G formula based on Cr of 2.15).   Medications:  Infusions:  . sodium chloride 75 mL/hr at 04/16/15 1007  . heparin 2,250 Units/hr (04/16/15 0250)    Assessment: 59 y/o male on heparin to bridge warfarin for hx PE. Heparin level is therapeutic at 0.5 on 2250 units/hr. INR is subtherapeutic at 1.85 but trending up. No bleeding noted. The patient is on IV fluconazole which can increase sensitivity to warfarin.  Goal of Therapy:  INR 2-3 Heparin level 0.3-0.7 units/ml Monitor platelets by anticoagulation protocol: Yes   Plan:  - Continue heparin drip at 2250 units/hr - Warfarin 2.5 mg PO x1 - Daily heparin level, CBC, and INR - Monitor for s/sx of bleeding  [facility], [place].D., BCPS Clinical Pharmacist Pager: [phone] 04/16/2015 10:57 AM

## 2015-04-16 NOTE — Progress Notes (Signed)
SLP Cancellation Note  Patient Details Name: Tracker Mance MRN: [id number] DOB: 02/02/1956   Cancelled treatment:       Reason Eval/Treat Not Completed: Medical issues which prohibited therapy (pt appears to be NPO pending possible procedure)   Germain Osgood, M.A. CCC-SLP [phone]  Germain Osgood 04/16/2015, 10:24 AM

## 2015-04-17 ENCOUNTER — Encounter (HOSPITAL_COMMUNITY): Payer: Self-pay

## 2015-04-17 ENCOUNTER — Inpatient Hospital Stay (HOSPITAL_COMMUNITY): Payer: Medicare Other

## 2015-04-17 LAB — CBC
HCT: 20 % — ABNORMAL LOW (ref 39.0–52.0)
HCT: 19.9 % — ABNORMAL LOW (ref 39.0–52.0)
HEMOGLOBIN: 6 g/dL — AB (ref 13.0–17.0)
Hemoglobin: 5.8 g/dL — CL (ref 13.0–17.0)
MCH: 27 pg (ref 26.0–34.0)
MCH: 26 pg (ref 26.0–34.0)
MCHC: 30 g/dL (ref 30.0–36.0)
MCHC: 29.1 g/dL — ABNORMAL LOW (ref 30.0–36.0)
MCV: 90.1 fL (ref 78.0–100.0)
MCV: 89.2 fL (ref 78.0–100.0)
PLATELETS: 135 10*3/uL — AB (ref 150–400)
PLATELETS: 140 10*3/uL — AB (ref 150–400)
RBC: 2.22 MIL/uL — ABNORMAL LOW (ref 4.22–5.81)
RBC: 2.23 MIL/uL — AB (ref 4.22–5.81)
RDW: 19.5 % — AB (ref 11.5–15.5)
RDW: 19.4 % — AB (ref 11.5–15.5)
WBC: 8.7 10*3/uL (ref 4.0–10.5)
WBC: 8.3 10*3/uL (ref 4.0–10.5)

## 2015-04-17 LAB — BASIC METABOLIC PANEL
Anion gap: 6 (ref 5–15)
BUN: 16 mg/dL (ref 6–20)
CALCIUM: 8.2 mg/dL — AB (ref 8.9–10.3)
CO2: 31 mmol/L (ref 22–32)
CREATININE: 2.03 mg/dL — AB (ref 0.61–1.24)
Chloride: 102 mmol/L (ref 101–111)
GFR calc Af Amer: 40 mL/min — ABNORMAL LOW (ref >60)
GFR, EST NON AFRICAN AMERICAN: 35 mL/min — AB (ref >60)
GLUCOSE: 102 mg/dL — AB (ref 65–99)
Potassium: 4.3 mmol/L (ref 3.5–5.1)
Sodium: 139 mmol/L (ref 135–145)

## 2015-04-17 LAB — PREPARE RBC (CROSSMATCH)

## 2015-04-17 LAB — HEMOGLOBIN AND HEMATOCRIT, BLOOD
HEMATOCRIT: 28.6 % — AB (ref 39.0–52.0)
HEMOGLOBIN: 9 g/dL — AB (ref 13.0–17.0)

## 2015-04-17 LAB — PROTIME-INR
INR: 2.29 — ABNORMAL HIGH (ref 0.00–1.49)
PROTHROMBIN TIME: 24.9 s — AB (ref 11.6–15.2)

## 2015-04-17 LAB — HEPARIN LEVEL (UNFRACTIONATED): Heparin Unfractionated: 0.55 IU/mL (ref 0.30–0.70)

## 2015-04-17 MED ORDER — FUROSEMIDE 10 MG/ML IJ SOLN
INTRAMUSCULAR | Status: AC
  Administered 2015-04-17: 40 mg via INTRAVENOUS
  Filled 2015-04-17: qty 4

## 2015-04-17 MED ORDER — SODIUM CHLORIDE 0.9 % IV SOLN
Freq: Once | INTRAVENOUS | Status: DC
Start: 2015-04-17 — End: 2015-04-17

## 2015-04-17 MED ORDER — HEPARIN SODIUM (PORCINE) 1000 UNIT/ML IJ SOLN
5000.0000 [IU] | Freq: Once | INTRAMUSCULAR | Status: AC
  Administered 2015-04-17: 5000 [IU]

## 2015-04-17 MED ORDER — SODIUM CHLORIDE 0.9 % IV SOLN: Freq: Once | INTRAVENOUS | Status: DC

## 2015-04-17 MED ORDER — FUROSEMIDE 10 MG/ML IJ SOLN
40.0000 mg | Freq: Once | INTRAMUSCULAR | Status: AC
  Administered 2015-04-17: 40 mg via INTRAVENOUS
  Filled 2015-04-17: qty 4

## 2015-04-17 NOTE — Progress Notes (Signed)
CRITICAL VALUE ALERT  Critical value received:  HBG 6.0  Date of notification:  04/17/15  Time of notification:  0607  Critical value read back:Yes.    Nurse who received alert:  Mady Gemma, RN  MD notified (1st page): Forrest Moron   Time of first page:  0611  MD notified (2nd page):  Time of second page:  Responding MD:  Forrest Moron   Time MD responded:  [phone]

## 2015-04-17 NOTE — Progress Notes (Signed)
3 rd unit of blood started. VSS.  Angus Seller

## 2015-04-17 NOTE — Progress Notes (Signed)
Pt Hgb 5.8 VSS. 1 unit of blood ordered and administered. Prior to administration pt. was confused and agitated. Stayed and monitored pt during the first 30 minutes.   Angus Seller

## 2015-04-17 NOTE — Progress Notes (Signed)
2nd  unit of blood started. VSS.   Samuel [name]

## 2015-04-17 NOTE — Progress Notes (Signed)
Right Nx tube replaced. It was draining well but has clotted again. Output has slowed down and pt c/o right flank pain and asking for help. He looks uncomfortable moving around in bed. CT from earlier shows high density material around the Nx and in the dependent portions of the right kidney. His Hgb has dropped. He is getting a blood transfusion.   Filed Vitals:   04/17/15 1044  BP: 140/86  Pulse: 114  Temp: 98.2 F (36.8 C)  Resp: 21    Intake/Output Summary (Last 24 hours) at 04/17/15 1146 Last data filed at 04/17/15 1029  Gross per 24 hour  Intake 1301.25 ml  Output   3345 ml  Net -2043.75 ml   PE: NAD Left Perc Nx urine clear Right perc Nx urine bloody with a long stringy clot in the tube. I tried to flush the right side with injectable saline. It flushes with resistance and would not aspirate. I diluted 5000 heparin in 5 ml and irrigated tube with it. The tube began to drain some. He seemed more comfortable.     A/P: Locally advanced and metastatic bladder cancer - s/p bilateral percs. Disease progression noted on CT. Foley in good position.   Gross hematuria - left Perc Nx doing well. Right perc seems to be obstructed again. Pt might need to change perc tube again, flush more often, and consider holding any anticoagulation until bleeding stops.

## 2015-04-17 NOTE — Progress Notes (Signed)
[facility] PROGRESS NOTE  Samuel [name] HYI:[id number] DOB: [birth year] DOA: 04/03/2015  PCP: [name], MD  Brief HPI: 59 year old Caucasian male well known to our service for numerous hospitalizations. He was discharged about a week ago after being managed for acute renal failure. He lives in a skilled nursing facility. He was sent over to the emergency department for seizure type activity. Evaluation revealed worsening in his renal function. He was hospitalized for further management. He underwent bilateral nephrostomy by interventional radiology. Subsequently, he aspirated and went into acute respiratory failure with hypoxia. He is encephalopathic. Discussions with his daughters were held. He was made DO NOT RESUSCITATE. Renal function is improving.   Consultants: Nephrology, urology, neurology. Interventional radiology, palliative and hospice medicine.  Procedures: Bilateral percutaneous nephrostomies 9/10  Antibiotics: Primaxin  Subjective: Patient involved anemia with hemoglobin dropping down to 5.8. I will transfuse 3 units of packed RBCs. Right PCN tube replaced yesterday by IR. Patient bleeding into the Foley catheter bag and the right PCN bag, likely I will discontinue anticoagulations on discharge.  Objective: Vital Signs  Filed Vitals:   04/17/15 1044 04/17/15 1330 04/17/15 1355 04/17/15 1440  BP: 140/86 157/86 133/91 157/77  Pulse: 114 83 85 81  Temp: 98.2 F (36.8 C) 98.6 F (37 C) 98.3 F (36.8 C) 98.9 F (37.2 C)  TempSrc: Oral Oral Oral Oral  Resp: _0 Height:      Weight:      SpO2: 100% 100% 96% 100%    Intake/Output Summary (Last 24 hours) at 04/17/15 1513 Last data filed at 04/17/15 1436  Gross per 24 hour  Intake 1636.25 ml  Output   3495 ml  Net -1858.75 ml   Filed Weights   04/14/15 2032 04/15/15 2231 04/17/15 0415  Weight: 95 kg (209 lb 7 oz) 100.7 kg (222 lb 0.1 oz) 98.9 kg (218 lb 0.6 oz)    General  appearance: Drowsy but arousable. Does not follow commands. Resp: Very diminished air entry on the right. Crackles heard. No rhonchi.  Cardio: regular rate and rhythm, S1, S2 normal, no murmur, click, rub or gallop GI: soft, non-tender; bowel sounds normal; no masses,  no organomegaly Neurologic: He is a known paraplegic. Involuntary movement of his upper extremities noted occasionally. Not conclusive for generalized clonic tonic activity. Delirious.  Lab Results: Results for orders placed or performed during the hospital encounter of 04/03/15  Culture, Urine     Status: None   Collection Time: 04/05/15  6:38 PM  Result Value Ref Range Status   Specimen Description URINE, CATHETERIZED  Final   Special Requests NONE  Final   Culture   Final    >=100,000 COLONIES/mL KLEBSIELLA PNEUMONIAE Confirmed Extended Spectrum Beta-Lactamase Producer (ESBL)    Report Status 04/08/2015 FINAL  Final   Organism ID, Bacteria KLEBSIELLA PNEUMONIAE  Final      Susceptibility   Klebsiella pneumoniae - MIC*    AMPICILLIN >=32 RESISTANT Resistant     CEFAZOLIN >=64 RESISTANT Resistant     CEFTRIAXONE >=64 RESISTANT Resistant     CIPROFLOXACIN >=4 RESISTANT Resistant     GENTAMICIN >=16 RESISTANT Resistant     IMIPENEM <=0.25 SENSITIVE Sensitive     NITROFURANTOIN 256 RESISTANT Resistant     TRIMETH/SULFA >=320 RESISTANT Resistant     AMPICILLIN/SULBACTAM >=32 RESISTANT Resistant     PIP/TAZO 32 INTERMEDIATE Intermediate     * >=100,000 COLONIES/mL KLEBSIELLA PNEUMONIAE  Culture, body fluid-bottle     Status: None  Collection Time: 04/07/15  9:33 AM  Result Value Ref Range Status   Specimen Description FLUID RIGHT KIDNEY  Final   Special Requests NONE  Final   Gram Stain YEAST IN BOTH AEROBIC AND ANAEROBIC BOTTLES   Final   Culture CANDIDA TROPICALIS CANDIDA GLABRATA   Final   Report Status 04/11/2015 FINAL  Final  Gram stain     Status: None   Collection Time: 04/07/15  9:33 AM  Result Value  Ref Range Status   Specimen Description FLUID RIGHT KIDNEY  Final   Special Requests NONE  Final   Gram Stain   Final    FEW WBC PRESENT,BOTH PMN AND MONONUCLEAR FEW YEAST    Report Status 04/07/2015 FINAL  Final  Culture, body fluid-bottle     Status: None   Collection Time: 04/07/15  9:33 AM  Result Value Ref Range Status   Specimen Description FLUID KIDNEY LEFT  Final   Special Requests NONE  Final   Gram Stain   Final    YEAST HYPHAL ELEMENTS SEEN IN BOTH AEROBIC AND ANAEROBIC BOTTLES CRITICAL RESULT CALLED TO, READ BACK BY AND VERIFIED WITH: K WICKER _0  04/07/15 MKELLY    Culture CANDIDA TROPICALIS  Final   Report Status 04/12/2015 FINAL  Final  Gram stain     Status: None   Collection Time: 04/07/15  9:33 AM  Result Value Ref Range Status   Specimen Description FLUID KIDNEY LEFT  Final   Special Requests NONE  Final   Gram Stain   Final    ABUNDANT WBC PRESENT,BOTH PMN AND MONONUCLEAR FEW YEAST    Report Status 04/07/2015 FINAL  Final  Culture, blood (routine x 2)     Status: None   Collection Time: 04/09/15  3:23 AM  Result Value Ref Range Status   Specimen Description BLOOD RIGHT HAND  Final   Special Requests BOTTLES DRAWN AEROBIC AND ANAEROBIC 10CC  Final   Culture NO GROWTH 5 DAYS  Final   Report Status 04/14/2015 FINAL  Final  Culture, blood (routine x 2)     Status: None   Collection Time: 04/09/15  3:36 AM  Result Value Ref Range Status   Specimen Description BLOOD LEFT HAND  Final   Special Requests BOTTLES DRAWN AEROBIC AND ANAEROBIC 10CC  Final   Culture NO GROWTH 5 DAYS  Final   Report Status 04/14/2015 FINAL  Final    Basic Metabolic Panel:  Recent Labs Lab 04/13/15 0425 04/14/15 0718 04/15/15 0545 04/16/15 0614 04/17/15 0541  NA 143 148* 140 142 139  K 2.3* 2.6* 3.1* 5.0 4.3  CL 100* 103 97* 99* 102  CO2 31 35* 33* 34* 31  GLUCOSE 155* 142* 125* 117* 102*  BUN 27* _1 CREATININE 2.00* 1.59* 1.81* 2.15* 2.03*  CALCIUM 7.6*  7.5* 7.4* 8.2* 8.2*  MG 2.2  --  1.4* 2.5*  --    Liver Function Tests: No results for input(s): AST, ALT, ALKPHOS, BILITOT, PROT, ALBUMIN in the last 168 hours. CBC:  Recent Labs Lab 04/12/15 0922 04/13/15 0425 04/16/15 1138 04/17/15 0541 04/17/15 0840  WBC 11.3* 11.0* 12.0* 8.7 8.3  HGB 8.2* 7.6* 7.0* 6.0* 5.8*  HCT 26.2* 24.7* 23.7* 20.0* 19.9*  MCV 84.0 84.9 88.4 90.1 89.2  PLT 119* 116* 135* 135* 140*     Studies/Results: Ct Abdomen Pelvis Wo Contrast  04/17/2015   CLINICAL DATA:  Anemia, generalized abdominal and back pain.  EXAM: CT ABDOMEN AND PELVIS WITHOUT  CONTRAST  TECHNIQUE: Multidetector CT imaging of the abdomen and pelvis was performed following the standard protocol without IV contrast.  COMPARISON:  CT scan of February 18, 2015.  FINDINGS: Multilevel degenerative disc disease is noted in the lower thoracic and lumbar spine. Right posterior basilar airspace opacity is noted concerning for pneumonia or atelectasis. Stable hiatal hernia.  Distended gallbladder with gallstones is noted, but no surrounding inflammation is noted. This is unchanged compared to prior exam.  No focal abnormality is noted in the liver, spleen or pancreas on these unenhanced images. Adrenal glands are unremarkable. Bilateral nephrostomies are present. Ureteral stents noted on prior exam have been removed. Moderate right hydroureteronephrosis is noted. No significant hydronephrosis is noted on the left. Residual contrast is seen in the right kidney and bladder. Foley catheter is seen within the urinary bladder. Severe wall thickening of urinary bladder is noted which is increased compared to prior exam, it is concerning for worsening bladder neoplasm or malignancy. The appendix appears normal. There is no evidence of bowel obstruction. Left lower quadrant colostomy is again noted.  Right iliac adenopathy is again noted which measures 4.8 x 2.6 cm in size ; this appears to be slightly enlarged compared to  prior exam. Mildly enlarged retroperitoneal adenopathy is noted which is not significantly changed compared to prior exam.  IMPRESSION: Cholelithiasis is noted with distended gallbladder, but no surrounding inflammation is noted. This is unchanged compared to prior exam.  Bilateral ureteral stents noted on prior exam have been removed, replaced by bilateral nephrostomies which are in grossly good position. Moderate right hydroureteronephrosis is noted. No significant dilatation is noted on the left.  There is severe wall thickening involving the urinary bladder consistent with worsening neoplasm or malignancy. Right iliac adenopathy is noted which appears to be slightly increased in size compared to prior exam. Stable mildly enlarged retroperitoneal lymph nodes are noted.  Right posterior basilar airspace opacity is noted concerning for pneumonia or atelectasis.   Electronically Signed   By: Marijo Conception, M.D.   On: 04/17/2015 10:39   Ir Nephrostomy Tube Change  04/16/2015   CLINICAL DATA:  59 year old male with a history of bilateral percutaneous nephrostomy for treatment of ureteral obstruction bilateral. The patient has a history of urothelial cell carcinoma.  The right nephrostomy tube has been blocked.  EXAM: PERC TUBE CHG W/CM  COMPARISON:  04/06/2015  ANESTHESIA/SEDATION: None  CONTRAST:  14m OMNIPAQUE IOHEXOL 300 MG/ML  SOLN  MEDICATIONS: None  FLUOROSCOPY TIME:  54 seconds  TECHNIQUE: The procedure, risks, benefits, and alternatives were explained to the patient. Questions regarding the procedure were encouraged and answered. The patient understands and consents to the procedure.  The right flank and indwelling tube were prepped with chlorhexidine in a sterile fashion, and a sterile drape was applied covering the operative field. A sterile gown and sterile gloves were used for the procedure. Local anesthesia was provided with 1% Lidocaine.  Small amount of contrast was infused through the indwelling  tube.  The tube was then ligated, and a Bentson wire was passed through the tube into the collecting system. Catheter was removed over the wire. A new 10 French catheter was placed into the collecting system over the Bentson wire. Inner stiffener and wire were removed. Pigtail catheter was formed and again a small amount of contrast was infused confirming position within the collecting system. Catheter was sutured in position.  Patient tolerated the procedure well and remained hemodynamically stable throughout.  No complications were encountered  and no significant blood loss was encountered.  COMPLICATIONS: None  FINDINGS: Aspiration fluid demonstrated serosanguineous urine within the right collecting system. Initial injection demonstrates ill-defined filling defect compatible with thrombus.  After exchange, pigtail catheter is formed within the collecting system.  IMPRESSION: Status post exchange of right-sided percutaneous nephrostomy tube.  Signed,  Dulcy Fanny. Earleen Newport, DO  [facility]  [facility]   Electronically Signed   By: Corrie Mckusick D.O.   On: 04/16/2015 16:32    Medications:  Scheduled: . sodium chloride   Intravenous Once  . baclofen  5 mg Oral TID  . [START ON 05/02/2015] Cholecalciferol  50,000 Units Oral Q30 days  . feeding supplement (ENSURE ENLIVE)  237 mL Oral BID BM  . fluconazole (DIFLUCAN) IV  400 mg Intravenous Q24H  . furosemide  40 mg Intravenous Once  . guaiFENesin  1,200 mg Oral BID  . lamoTRIgine  200 mg Oral BID  . metoprolol tartrate  25 mg Oral BID  . pantoprazole  40 mg Oral Daily  . polyvinyl alcohol  2 drop Both Eyes TID  . silver sulfADIAZINE   Topical BID  . sodium chloride  3 mL Intravenous Q12H   Continuous:   OKH:[id number] **OR** acetaminophen, guaiFENesin-dextromethorphan, hydrALAZINE, iohexol, iohexol, LORazepam, morphine injection, nitroGLYCERIN, ondansetron (ZOFRAN) IV, RESOURCE THICKENUP CLEAR, sodium  chloride  Assessment/Plan:  Principal Problem:   ARF (acute renal failure) Active Problems:   Acute encephalopathy   Seizures   Anemia   Hyponatremia   Acute renal failure syndrome   Seizure   Chronic anticoagulation   Essential hypertension   Obstructive uropathy   Infection due to ESBL-producing Klebsiella pneumoniae   Acute respiratory failure with hypoxia   Aspiration pneumonia    Acute hypoxic respiratory failure secondary to aspiration pneumonia versus healthcare associated pneumonia with sepsis Patient's chest x-ray shows extensive infiltrate in the right lung. Most likely this is secondary to aspiration.  On Primaxin and vancomycin. Significant decline along with aspiration, made DO NOT RESUSCITATE by family. Probably will benefit from goals of care meeting in the nursing home after discharge. I spoke with his daughter [name], she leans toward the idea of palliative/hospice, she wants to talk to palliative care Last oncology evaluation on 03/26/2015 suggesting it will be reasonable to consider palliative medicine evaluation. Palliative care met with the family and recommended SNF with palliative care follow-up.  Acute renal failure/obstructive uropathy Status post bilateral percutaneous nephrostomy tube placement by IR. Renal function slowly improving.  At the time of admission Ultrasound showed worsening hydronephrosis.  He has a known history of bladder cancer with metastases.  Right PCN tube has some bloody return, IR to possible replacement today.  ESBL Klebsiella pneumoniae/Recent VRE UTI Urine cultures from 9/8 growing ESBL Klebsiella. Start imipenem.  Patient was on linezolid till 9/10 for VRE.  Patient is on Primaxin since 04/08/2015, will discontinue today after the PCN tube replacement.  Seizure disorder Apparently had an episode of seizure at the SNF. Seen by neurology. They think that his presentation is likely due to toxicity from his antiepileptics  drugs. They have decreased the dose of his Lamictal. EEG report from 9/7 is reviewed. MRI brain report as above.   Acute encephalopathy Encephalopathy is likely due to a combination of renal failure, medications, seizure activity, and hypoxia. Ammonia level is normal. We have cutback on his sedative agents. Unfortunately now due to acute respiratory failure don't anticipate his mental status to improve anytime soon.   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 CT on previous admission showed stable bilateral hydronephrosis. Ultrasound from this hospitalization shows worsening hydronephrosis.  Last encounter with oncology, they recommend to be evaluated by palliative/hospice medicine.  Yeast in fluid from nephrostomy Currently on Diflucan. Candida tropicalis identified.  Essential hypertension, poorly controlled Blood pressure was noted to be elevated. Probably due to his agitation. Continue current medications.   History of PE on chronic anticoagulation Patient is on warfarin as outpatient. This was held due to need for possible urological procedure. His PE was in 2015. Anticoagulation was held for his procedures. Resumed IV heparin.  I have asked pharmacy to restart Coumadin, if his renal function can tolerate him I put him on DOAC.  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 CT scan 7/24 showeddecubitus ulcer involving the right buttock with a tractextending upward to the sacrum, with evidence of sacral osteomyelitis, although this was unchanged from before. Could have chronic osteomyelitis. Bone scan was done during previous hospitalization which ruled out osteomyelitis.   Anemia Patient has chronic anemia secondary to his chronic kidney disease and transitional cell carcinoma. Hemoglobin responded to blood transfusion. Now has acute anemia on top of chronic anemia likely  secondary to bleeding. Patient is on heparin and Coumadin both discontinued, patient bleeding in the right PCN bag and the Foley catheter back. CT done showed no evidence of retroperitoneal hemorrhage.  Hypokalemia Severe hypokalemia with potassium of 2.5, replete with oral supplements. This is replaced by oral and parenteral supplements, check BMP in a.m.  Dysphagia Patient did have acute respiratory failure from presumed aspiration pneumonia, had to be intubated and mechanically ventilated. Patient is more awake and alert, seen by SLP and recommended dysphagia 2 with nectar thick liquids.  DVT Prophylaxis: intravenous heparin Code Status: DO NOT RESUSCITATE Family Communication: Discussed with both his daughters [name] and [name]. Disposition Plan: Patient is declining. He may not survive this hospitalization. Family wants to continue active treatment for now.      LOS: 14 days   [place] [facility] Pager [phone]  04/17/2015, 3:13 PM  If 7PM-7AM, please contact night-coverage at [email or web address], password [facility]

## 2015-04-17 NOTE — Progress Notes (Signed)
Patient's foley catheter is draining bright red blood with clots with minimal urinary output.  Will continue to monitor for any changes

## 2015-04-17 NOTE — Progress Notes (Signed)
ANTIBIOTIC CONSULT NOTE   Pharmacy Consult for fluconazole  Indication: infected nephrostomy  Assessment/plan: 59 y/o male on day 10 fluconazole for Candida species cultured from nephrostomy fluid. Renal function has improved and CrCl consistently >30 ml/min so dosing is appropriate.   Continue fluconazole 400 mg IV q24h Pharmacy signing off, please re-consult if needed  [facility], [place].D., BCPS Clinical Pharmacist Pager: [phone] 04/17/2015 1:26 PM

## 2015-04-18 LAB — CBC
HEMATOCRIT: 26.8 % — AB (ref 39.0–52.0)
Hemoglobin: 8.7 g/dL — ABNORMAL LOW (ref 13.0–17.0)
MCH: 27.8 pg (ref 26.0–34.0)
MCHC: 32.5 g/dL (ref 30.0–36.0)
MCV: 85.6 fL (ref 78.0–100.0)
PLATELETS: 147 10*3/uL — AB (ref 150–400)
RBC: 3.13 MIL/uL — AB (ref 4.22–5.81)
RDW: 18.2 % — ABNORMAL HIGH (ref 11.5–15.5)
WBC: 9.6 10*3/uL (ref 4.0–10.5)

## 2015-04-18 LAB — BASIC METABOLIC PANEL
Anion gap: 9 (ref 5–15)
BUN: 17 mg/dL (ref 6–20)
CHLORIDE: 100 mmol/L — AB (ref 101–111)
CO2: 30 mmol/L (ref 22–32)
CREATININE: 2.53 mg/dL — AB (ref 0.61–1.24)
Calcium: 8.1 mg/dL — ABNORMAL LOW (ref 8.9–10.3)
GFR calc non Af Amer: 27 mL/min — ABNORMAL LOW (ref >60)
GFR, EST AFRICAN AMERICAN: 31 mL/min — AB (ref >60)
Glucose, Bld: 94 mg/dL (ref 65–99)
Potassium: 3.6 mmol/L (ref 3.5–5.1)
Sodium: 139 mmol/L (ref 135–145)

## 2015-04-18 MED ORDER — OXYCODONE HCL 5 MG PO TABS: 5.0000 mg | ORAL_TABLET | ORAL | Status: DC | PRN

## 2015-04-18 MED ORDER — CLONAZEPAM 0.5 MG PO TABS: ORAL_TABLET | ORAL | Status: DC

## 2015-04-18 NOTE — Progress Notes (Signed)
Speech Language Pathology Treatment: Dysphagia  Patient Details Name: Samuel [name] MRN: [id number] DOB: 18-May-1956 Today's Date: 04/18/2015 Time: 1683-7290 SLP Time Calculation (min) (ACUTE ONLY): 9 min  Assessment / Plan / Recommendation Clinical Impression  Pt has subtle throat clearing with advanced trials of thin liquids, but otherwise appears to tolerate nectar thick liquids, purees, and chopped solids well with Mod cues from SLP for pacing and oral clearance. Given persistently altered mentation, would continue with Dys 2 diet and nectar thick liquids.   HPI Other Pertinent Information: 59 year old male, SNF resident,  admitted following seizure like activity. Diagnosed with with acute on chronic renal failure. PMH of HTN, paraplegia following spinal cord injury, bipolar d/o, seizure d/o, GERD, alcohol abuse, PNA, polysubstance abuse, hospitalization 1 month prior for PNA and UTI. S/p bilateral percutaneous nephrostomy tube placement 9/10. BSE 9/13 pt in non rebreather mask, confused, decreased alertness recommended ice chips and D/C ST due to poor prognosis. Today increased alertness and swallow eval re-ordered.     Pertinent Vitals Pain Assessment: Faces Faces Pain Scale: No hurt  SLP Plan  Continue with current plan of care    Recommendations Diet recommendations: Dysphagia 2 (fine chop);Nectar-thick liquid Liquids provided via: Cup Medication Administration: Crushed with puree Supervision: Staff to assist with self feeding;Full supervision/cueing for compensatory strategies Compensations: Slow rate;Small sips/bites;Check for anterior loss Postural Changes and/or Swallow Maneuvers: Seated upright 90 degrees       Oral Care Recommendations: Oral care BID Follow up Recommendations: Skilled Nursing facility Plan: Continue with current plan of care   Samuel [name], M.A. CCC-SLP [phone]  Samuel [name] 04/18/2015, 4:10 PM

## 2015-04-18 NOTE — Progress Notes (Signed)
Pt noted to have significant amount of drainage seeping through the dressing and on to the bed of the L nephrostomy tube. Old dressing removed and site checked. Sutures remain in place, connections checked for tightness. Will continue to monitor for further drainage. Dorthey Sawyer, RN

## 2015-04-18 NOTE — Clinical Social Work Note (Signed)
Patient medically stable for discharge today and [facility] admissions director, Samuel [name] contacted and informed. CSW advised by Samuel [name] and administrator Samuel [name] that patient cannot return to their facility because of the money he owes them. CSW informed that patient owes approx. $20,000 and they took patient to court and has a judgement for $10,000, so he now owes them $10,000.  Patient's daughter Samuel [name] [name]'s bank card, but has not paid for September.  CSW talked later with daughter regarding situation and she indicated that she and her sister cannot take patient into their homes.   CSW talked with daughter, Samuel [name] [phone]) regarding a skilled facility placement for her father and Samuel [name] not taking him back. Samuel [name] is aware and was angry and upset regarding the situation with her father and Samuel [name]. CSW expressed understanding and allowed daughter to express her feelings regarding the situation. She is in agreement with CSW initiating a new bed search for patient. She requested to be called at work on Thursday ([phone]) from 8 to 5 pm.   Crawford Givens, MSW, [place] Licensed Clinical Social Worker [place] [phone]

## 2015-04-18 NOTE — Care Management Important Message (Signed)
Important Message  Patient Details  Name: Samuel [name] MRN: [id number] Date of Birth: November 13, 1955   Medicare Important Message Given:  Other (see comment)  Important Message presented again today this is approx 7th notification.     Royal, Rory Percy, RN 04/18/2015, 11:21 AM

## 2015-04-18 NOTE — Discharge Summary (Signed)
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  MEDICATIONS: 400 mg IV Cipro  FLUOROSCOPY TIME:  1 minutes 12 seconds  TECHNIQUE: The procedure, risks, benefits, and alternatives were explained to the patient's family. Questions regarding the procedure were encouraged and answered. The patient understands and consents to the procedure.  The patient was induced with anesthesia with the anesthesia team present. He was then placed in prone position on the fluoroscopy table.  The bilateral flank was prepped with chlorhexidine in a sterile fashion, and a sterile drape was applied covering the operative field. A sterile gown and sterile gloves were used for the procedure. Local anesthesia was provided with 1% Lidocaine.  Left:  The left flank was interrogated with ultrasound and the left kidney identified. The kidney is hydronephrotic. A suitable access site on the skin overlying the lower pole, posterior calix was identified. After local mg anesthesia was achieved, a small skin nick was made with an 11 blade scalpel. A 21 gauge Accustick needle was then advanced under direct  sonographic guidance into the lower pole of the left inferior kidney. A 0.018 inch wire was advanced under fluoroscopic guidance into the left renal collecting system. The Accustick sheath was then advanced over the wire and a 0.018 system exchanged for a 0.035 system. Gentle hand injection of contrast material confirms placement of the sheath within the renal collecting system. A 10-French Cook all-purpose drain was then placed and positioned under fluoroscopic guidance. The locking loop is well formed within the left renal pelvis. The catheter was secured to the skin with 2-0 Prolene  and a sterile bandage was placed. Catheter was left to gravity bag drainage.  A sample was sent for analysis.  Right:  The right flank was interrogated with ultrasound and the left kidney identified. The kidney is hydronephrotic. A suitable access site on the skin overlying the lower pole, posterior calix was identified. After local mg anesthesia was achieved, a small skin nick was made with an 11 blade scalpel. A 21 gauge Accustick needle was then advanced under direct sonographic guidance into the lower pole of the right inferior kidney. A 0.018 inch wire was advanced under fluoroscopic guidance into the left renal collecting system. The Accustick sheath was then advanced over the wire and a 0.018 system exchanged for a 0.035 system. Gentle hand injection of contrast material confirms placement of the sheath within the renal collecting system. A 10-French Cook all-purpose drain was then placed and positioned under fluoroscopic guidance. The locking loop is well formed within the left renal pelvis. The catheter was secured to the skin with 2-0 Prolene and a sterile bandage was placed. Catheter was left to gravity bag drainage.  A sample was sent for analysis.  Patient remained hemodynamically stable throughout.  No complications were encountered.  COMPLICATIONS: None  FINDINGS: Ultrasound survey demonstrates hydronephrosis of the  bilateral kidneys.  Ten French percutaneous nephrostomy tube were well positioned within the left and subsequently right kidneys, as above.  IMPRESSION: Status post placement of bilateral percutaneous nephrostomy tube.  A sample was sent to the lab for analysis from each of the kidneys.  Signed,  Dulcy Fanny. Earleen Newport, DO  [facility]  [facility]   Electronically Signed   By: Corrie Mckusick D.O.   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 He has received up sizing of ureteral stents with his primary urology surgeon which has not relieved the obstruction.  He has been referred for bilateral percutaneous nephrostomy.  The patient is not responsive, and is attempting to removed himself from the fluoroscopy table upon initiation of the first attempt for percutaneous placement with moderate [place]. Anesthesia team was consulted for assistance with general endotracheal tube anesthesia and need for paralysis.  EXAM: IR NEPHROSTOMY PLACEMENT LEFT; IR NEPHROSTOMY PLACEMENT RIGHT  COMPARISON:  Ultrasound 03/23/2015, CT 02/18/2015  ANESTHESIA/SEDATION: Anesthesia team was present for general endotracheal tube anesthesia.  CONTRAST:  52m OMNIPAQUE IOHEXOL 300 MG/ML  SOLN  MEDICATIONS: 400 mg IV Cipro  FLUOROSCOPY TIME:  1 minutes 12 seconds  TECHNIQUE: The procedure, risks, benefits, and alternatives were explained to the patient's family. Questions regarding the procedure were encouraged and answered. The patient understands and consents to the procedure.  The patient was induced with anesthesia with the anesthesia team present. He was then placed in prone position on the fluoroscopy table.  The bilateral flank was prepped with chlorhexidine in a sterile fashion, and a sterile drape was applied covering the operative field. A  sterile gown and sterile gloves were used for the procedure. Local anesthesia was provided with 1% Lidocaine.  Left:  The left flank was interrogated with ultrasound and the left kidney identified. The kidney is hydronephrotic. A suitable access site on the skin overlying the lower pole, posterior calix was identified. After local mg anesthesia was achieved, a small skin nick was made with an 11 blade scalpel. A 21 gauge Accustick needle was then  advanced under direct sonographic guidance into the lower pole of the left inferior kidney. A 0.018 inch wire was advanced under fluoroscopic guidance into the left renal collecting system. The Accustick sheath was then advanced over the wire and a 0.018 system exchanged for a 0.035 system. Gentle hand injection of contrast material confirms placement of the sheath within the renal collecting system. A 10-French Cook all-purpose drain was then placed and positioned under fluoroscopic guidance. The locking loop is well formed within the left renal pelvis. The catheter was secured to the skin with 2-0 Prolene and a sterile bandage was placed. Catheter was left to gravity bag drainage.  A sample was sent for analysis.  Right:  The right flank was interrogated with ultrasound and the left kidney identified. The kidney is hydronephrotic. A suitable access site on the skin overlying the lower pole, posterior calix was identified. After local mg anesthesia was achieved, a small skin nick was made with an 11 blade scalpel. A 21 gauge Accustick needle was then advanced under direct sonographic guidance into the lower pole of the right inferior kidney. A 0.018 inch wire was advanced under fluoroscopic guidance into the left renal collecting system. The Accustick sheath was then advanced over the wire and a 0.018 system exchanged for a 0.035 system. Gentle hand injection of contrast material confirms placement of the sheath within the renal collecting system. A 10-French Cook  all-purpose drain was then placed and positioned under fluoroscopic guidance. The locking loop is well formed within the left renal pelvis. The catheter was secured to the skin with 2-0 Prolene and a sterile bandage was placed. Catheter was left to gravity bag drainage.  A sample was sent for analysis.  Patient remained hemodynamically stable throughout.  No complications were encountered.  COMPLICATIONS: None  FINDINGS: Ultrasound survey demonstrates hydronephrosis of the bilateral kidneys.  Ten French percutaneous nephrostomy tube were well positioned within the left and subsequently right kidneys, as above.  IMPRESSION: Status post placement of bilateral percutaneous nephrostomy tube.  A sample was sent to the lab for analysis from each of the kidneys.  Signed,  Dulcy Fanny. Earleen Newport, DO  [facility]  [facility]   Electronically Signed   By: Corrie Mckusick D.O.   On: 04/07/2015 10:16    Microbiology: Recent Results (from the past 240 hour(s))  Culture, blood (routine x 2)     Status: None   Collection Time: 04/09/15  3:23 AM  Result Value Ref Range Status   Specimen Description BLOOD RIGHT HAND  Final   Special Requests BOTTLES DRAWN AEROBIC AND ANAEROBIC 10CC  Final   Culture NO GROWTH 5 DAYS  Final   Report Status 04/14/2015 FINAL  Final  Culture, blood (routine x 2)     Status: None   Collection Time: 04/09/15  3:36 AM  Result Value Ref Range Status   Specimen Description BLOOD LEFT HAND  Final   Special Requests BOTTLES DRAWN AEROBIC AND ANAEROBIC 10CC  Final   Culture NO GROWTH 5 DAYS  Final   Report Status 04/14/2015 FINAL  Final     Labs: Basic Metabolic Panel:  Recent Labs Lab 04/13/15 0425 04/14/15 0718 04/15/15 0545 04/16/15 0614 04/17/15 0541 04/18/15 0610  NA 143 148* 140 142 139 139  K 2.3* 2.6* 3.1* 5.0 4.3 3.6  CL 100* 103 97* 99* 102 100*  CO2 31 35* 33* 34* 31 30  GLUCOSE 155* 142* 125* 117* 102* 94  BUN 27* 16 16  19 16  17   CREATININE 2.00* 1.59* 1.81* 2.15* 2.03* 2.53*  CALCIUM 7.6* 7.5* 7.4* 8.2* 8.2* 8.1*  MG 2.2  --  1.4* 2.5*  --   --    Liver Function Tests: No results for input(s): AST, ALT, ALKPHOS, BILITOT, PROT, ALBUMIN in the last 168 hours. No results for input(s): LIPASE, AMYLASE in the last 168 hours. No results for input(s): AMMONIA in the last 168 hours. CBC:  Recent Labs Lab 04/13/15 0425 04/16/15 1138 04/17/15 0541 04/17/15 0840 04/17/15 2250 04/18/15 0610  WBC 11.0* 12.0* 8.7 8.3  --  9.6  HGB 7.6* 7.0* 6.0* 5.8* 9.0* 8.7*  HCT 24.7* 23.7* 20.0* 19.9* 28.6* 26.8*  MCV 84.9 88.4 90.1 89.2  --  85.6  PLT 116* 135* 135* 140*  --  147*   Cardiac Enzymes: No results for input(s): CKTOTAL, CKMB, CKMBINDEX, TROPONINI in the last 168 hours. BNP: BNP (last 3 results)  Recent Labs  09/17/14 1651  BNP 41.0    ProBNP (last 3 results) No results for input(s): PROBNP in the last 8760 hours.  CBG:  Recent Labs Lab 04/14/15 2035  GLUCAP 143*       Signed:  ELMAHI,MUTAZ A  [facility] 04/18/2015, 10:40 AM

## 2015-04-19 LAB — BASIC METABOLIC PANEL
Anion gap: 10 (ref 5–15)
BUN: 15 mg/dL (ref 6–20)
CHLORIDE: 98 mmol/L — AB (ref 101–111)
CO2: 27 mmol/L (ref 22–32)
CREATININE: 2.37 mg/dL — AB (ref 0.61–1.24)
Calcium: 7.9 mg/dL — ABNORMAL LOW (ref 8.9–10.3)
GFR calc Af Amer: 33 mL/min — ABNORMAL LOW (ref >60)
GFR calc non Af Amer: 29 mL/min — ABNORMAL LOW (ref >60)
GLUCOSE: 98 mg/dL (ref 65–99)
POTASSIUM: 3 mmol/L — AB (ref 3.5–5.1)
SODIUM: 135 mmol/L (ref 135–145)

## 2015-04-19 LAB — CBC
HEMATOCRIT: 28.2 % — AB (ref 39.0–52.0)
Hemoglobin: 9 g/dL — ABNORMAL LOW (ref 13.0–17.0)
MCH: 27.1 pg (ref 26.0–34.0)
MCHC: 31.9 g/dL (ref 30.0–36.0)
MCV: 84.9 fL (ref 78.0–100.0)
PLATELETS: 169 10*3/uL (ref 150–400)
RBC: 3.32 MIL/uL — ABNORMAL LOW (ref 4.22–5.81)
RDW: 17.9 % — AB (ref 11.5–15.5)
WBC: 9.8 10*3/uL (ref 4.0–10.5)

## 2015-04-19 MED ORDER — POTASSIUM CHLORIDE CRYS ER 20 MEQ PO TBCR
60.0000 meq | EXTENDED_RELEASE_TABLET | Freq: Four times a day (QID) | ORAL | Status: AC
  Administered 2015-04-19: 60 meq via ORAL
  Filled 2015-04-19 (×2): qty 3

## 2015-04-19 NOTE — Progress Notes (Signed)
Subjective: Slightly disoriented, no changes clinically. Still has some blood in the right PCN tube and the Foley catheter back. Potassium is 3.0, replete with oral supplements.  Objective: Filed Vitals:   04/19/15 1005  BP: 146/81  Pulse: 99  Temp: 98.4 F (36.9 C)  Resp: 18  General: Alert and awake, oriented x1, not in any acute distress. HEENT: anicteric sclera, pupils reactive to light and accommodation, EOMI CVS: S1-S2 clear, no murmur rubs or gallops Chest: clear to auscultation bilaterally, no wheezing, rales or rhonchi Abdomen: soft nontender, nondistended, normal bowel sounds, no organomegaly Extremities: S/P Left LLE amutation Neuro: Cranial nerves II-XII intact, no focal neurological deficits  Assessment and plan: Patient discharged yesterday, still waiting for potassium to be up. No changes clinically, no anticoagulation.  Birdie Hopes Pager: [phone] 04/19/2015, 10:25 AM

## 2015-04-19 NOTE — Progress Notes (Signed)
Nutrition Follow-up  DOCUMENTATION CODES:   Obesity unspecified  INTERVENTION:  Encourage continuation of nutritional supplements (Ensure) post discharge especially if po intake is poor.   Encourage adequate PO intake.  NUTRITION DIAGNOSIS:   Inadequate oral intake related to inability to eat as evidenced by NPO status; advanced to diet; po 50%; progressing  GOAL:   Patient will meet greater than or equal to 90% of their needs; progressing  MONITOR:   PO intake, Supplement acceptance, Weight trends, Labs, I & O's  REASON FOR ASSESSMENT:   Low Br[place]    ASSESSMENT:   59 year old Caucasian male. He was discharged about a week ago after being managed for acute renal failure. He lives in a skilled nursing facility. He was sent over to the emergency department for seizure type activity. Evaluation revealed worsening in his renal function. He was hospitalized for further management. He underwent bilateral nephrostomy by interventional radiology.  Plans for pt to be discharged. Per MD note, pt with low potassium thus will replete with oral supplements. Encourage Ensure post discharged especially if po intake is poor.  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  Estimated Nutritional Needs:   Kcal:  2000-2200  Protein:  100-120 grams  Fluid:  Per MD  EDUCATION NEEDS:   No education needs identified at this time  Corrin Parker, MS, RD, LDN Pager # [phone] After hours/ weekend pager # [phone]

## 2015-04-19 NOTE — Clinical Social Work Note (Signed)
CSW talked with admissions director and administrator of [facility] skilled facility regarding not accepting patient back due to money being owed. Ms. Jana Half (administrator) informed CSW that she talked with daughter [name] today and she and her sister [name] does not want patient to return to [facility] and they will be coming to the facility on Sunday to retrieve patient's belongings.  Patient's information sent out to facilities in [facility]. CSW will follow-up with daughters on Friday regarding facility responses.   Crawford Givens, MSW, LCSW Licensed Clinical Social Worker [place] [phone]

## 2015-04-19 NOTE — Progress Notes (Signed)
Patient refusing PO K+CL.  MD notified.

## 2015-04-20 LAB — BASIC METABOLIC PANEL
ANION GAP: 10 (ref 5–15)
BUN: 14 mg/dL (ref 6–20)
CALCIUM: 8.2 mg/dL — AB (ref 8.9–10.3)
CO2: 27 mmol/L (ref 22–32)
Chloride: 100 mmol/L — ABNORMAL LOW (ref 101–111)
Creatinine, Ser: 2.07 mg/dL — ABNORMAL HIGH (ref 0.61–1.24)
GFR, EST AFRICAN AMERICAN: 39 mL/min — AB (ref >60)
GFR, EST NON AFRICAN AMERICAN: 34 mL/min — AB (ref >60)
GLUCOSE: 101 mg/dL — AB (ref 65–99)
Potassium: 3.4 mmol/L — ABNORMAL LOW (ref 3.5–5.1)
SODIUM: 137 mmol/L (ref 135–145)

## 2015-04-20 LAB — CBC
HCT: 27 % — ABNORMAL LOW (ref 39.0–52.0)
HEMOGLOBIN: 8.7 g/dL — AB (ref 13.0–17.0)
MCH: 28.2 pg (ref 26.0–34.0)
MCHC: 32.2 g/dL (ref 30.0–36.0)
MCV: 87.7 fL (ref 78.0–100.0)
Platelets: 217 10*3/uL (ref 150–400)
RBC: 3.08 MIL/uL — ABNORMAL LOW (ref 4.22–5.81)
RDW: 18.1 % — ABNORMAL HIGH (ref 11.5–15.5)
WBC: 8.3 10*3/uL (ref 4.0–10.5)

## 2015-04-20 MED ORDER — HYDROMORPHONE HCL 1 MG/ML IJ SOLN
1.0000 mg | INTRAMUSCULAR | Status: DC | PRN
  Administered 2015-04-20 – 2015-04-21 (×2): 1 mg via INTRAVENOUS
  Administered 2015-04-21: 2 mg via INTRAVENOUS
  Filled 2015-04-20: qty 2
  Filled 2015-04-20 (×3): qty 1

## 2015-04-20 MED ORDER — POTASSIUM CHLORIDE CRYS ER 20 MEQ PO TBCR
40.0000 meq | EXTENDED_RELEASE_TABLET | Freq: Once | ORAL | Status: AC
  Administered 2015-04-20: 40 meq via ORAL

## 2015-04-20 MED ORDER — OXYCODONE-ACETAMINOPHEN 5-325 MG PO TABS
1.0000 | ORAL_TABLET | ORAL | Status: DC | PRN
  Administered 2015-04-20 (×2): 2 via ORAL
  Filled 2015-04-20 (×4): qty 2

## 2015-04-20 MED ORDER — LAMOTRIGINE 200 MG PO TABS: 200.0000 mg | ORAL_TABLET | Freq: Two times a day (BID) | ORAL | Status: AC

## 2015-04-20 NOTE — Progress Notes (Signed)
Speech Language Pathology Treatment: Dysphagia  Patient Details Name: Samuel [name] MRN: [id number] DOB: 02-Jul-1956 Today's Date: 04/20/2015 Time: 1050-1110 SLP Time Calculation (min) (ACUTE ONLY): 20 min  Assessment / Plan / Recommendation Clinical Impression  Patient was pleasant and generally cooperative. He had just recently finished with his breakfast meal, however he mainly drank liquids. Patient refused any solids at this time and refused anything but water. SLP assessed his toleration with cup sips of thin liquids. Patient exhibited one instance immediate throat clear, delayed cough when he took two large successive sips of thin liquids, however when clinician controlled cup sip size, patient did not exhibit any overt s/s of aspiration.    HPI Other Pertinent Information: 59 year old male, SNF resident,  admitted following seizure like activity. Diagnosed with with acute on chronic renal failure. PMH of HTN, paraplegia following spinal cord injury, bipolar d/o, seizure d/o, GERD, alcohol abuse, PNA, polysubstance abuse, hospitalization 1 month prior for PNA and UTI. S/p bilateral percutaneous nephrostomy tube placement 9/10. BSE 9/13 pt in non rebreather mask, confused, decreased alertness recommended ice chips and D/C ST due to poor prognosis. Today increased alertness and swallow eval re-ordered.     Pertinent Vitals Pain Assessment: No/denies pain (patient had just been given dose of morphine)  SLP Plan  Continue with current plan of care    Recommendations Diet recommendations: Dysphagia 2 (fine chop);Nectar-thick liquid Liquids provided via: Cup Medication Administration: Crushed with puree Supervision: Staff to assist with self feeding;Full supervision/cueing for compensatory strategies Compensations: Slow rate;Small sips/bites;Check for anterior loss Postural Changes and/or Swallow Maneuvers: Seated upright 90 degrees              Oral Care Recommendations: Oral care  BID Follow up Recommendations: Skilled Nursing facility Plan: Continue with current plan of care    [place], [place], [place] 04/20/2015 3:51 PM

## 2015-04-20 NOTE — Discharge Instructions (Signed)
Acute Kidney Injury Acute kidney injury is a disease in which there is sudden (acute) damage to the kidneys. The kidneys are 2 organs that lie on either side of the spine between the middle of the back and the front of the abdomen. The kidneys:  Remove wastes and extra water from the blood.   Produce important hormones. These help keep bones strong, regulate blood pressure, and help create red blood cells.   Balance the fluids and chemicals in the blood and tissues. A small amount of kidney damage may not cause problems, but a large amount of damage may make it difficult or impossible for the kidneys to work the way they should. Acute kidney injury may develop into long-lasting (chronic) kidney disease. It may also develop into a life-threatening disease called end-stage kidney disease. Acute kidney injury can get worse very quickly, so it should be treated right away. Early treatment may prevent other kidney diseases from developing.  CAUSES   A problem with blood flow to the kidneys. This may be caused by:   Blood loss.   Heart disease.   Severe burns.   Liver disease.  Direct damage to the kidneys. This may be caused by:  Some medicines.   A kidney infection.   Poisoning or consuming toxic substances.   A surgical wound.   A blow to the kidney area.   A problem with urine flow. This may be caused by:   Cancer.   Kidney stones.   An enlarged prostate. SYMPTOMS   Swelling (edema) of the legs, ankles, or feet.   Tiredness (lethargy).   Nausea or vomiting.   Confusion.   Problems with urination, such as:   Painful or burning feeling during urination.   Decreased urine production.   Frequent accidents in children who are potty trained.   Bloody urine.   Muscle twitches and cramps.   Shortness of breath.   Seizures.   Chest pain or pressure. Sometimes, no symptoms are present. DIAGNOSIS Acute kidney injury may be detected  and diagnosed by tests, including blood, urine, imaging, or kidney biopsy tests.  TREATMENT Treatment of acute kidney injury varies depending on the cause and severity of the kidney damage. In mild cases, no treatment may be needed. The kidneys may heal on their own. If acute kidney injury is more severe, your caregiver will treat the cause of the kidney damage, help the kidneys heal, and prevent complications from occurring. Severe cases may require a procedure to remove toxic wastes from the body (dialysis) or surgery to repair kidney damage. Surgery may involve:   Repair of a torn kidney.   Removal of an obstruction. Most of the time, you will need to stay overnight at the hospital.  HOME CARE INSTRUCTIONS:  Follow your prescribed diet.  Only take over-the-counter or prescription medicines as directed by your caregiver.  Do not take any new medicines (prescription, over-the-counter, or nutritional supplements) unless approved by your caregiver. Many medicines can worsen your kidney damage or need to have the dose adjusted.   Keep all follow-up appointments as directed by your caregiver.  Observe your condition to make sure you are healing as expected. SEEK IMMEDIATE MEDICAL CARE IF:  You are feeling ill or have severe pain in the back or side.   Your symptoms return or you have new symptoms.  You have any symptoms of end-stage kidney disease. These include:   Persistent itchiness.   Loss of appetite.   Headaches.   Abnormally dark  or light skin.  Numbness in the hands or feet.   Easy bruising.   Frequent hiccups.   Menstruation stops.   You have a fever.  You have increased urine production.  You have pain or bleeding when urinating. MAKE SURE YOU:   Understand these instructions.  Will watch your condition.  Will get help right away if you are not doing well or get worse Document Released: 01/27/2011 Document Revised: 11/08/2012 Document  Reviewed: 03/12/2012 [facility] Patient Information 2015 [place], [place]. This information is not intended to replace advice given to you by your health care provider. Make sure you discuss any questions you have with your health care provider.  Acute Kidney Injury Acute kidney injury is a disease in which there is sudden (acute) damage to the kidneys. The kidneys are 2 organs that lie on either side of the spine between the middle of the back and the front of the abdomen. The kidneys:  Remove wastes and extra water from the blood.   Produce important hormones. These help keep bones strong, regulate blood pressure, and help create red blood cells.   Balance the fluids and chemicals in the blood and tissues. A small amount of kidney damage may not cause problems, but a large amount of damage may make it difficult or impossible for the kidneys to work the way they should. Acute kidney injury may develop into long-lasting (chronic) kidney disease. It may also develop into a life-threatening disease called end-stage kidney disease. Acute kidney injury can get worse very quickly, so it should be treated right away. Early treatment may prevent other kidney diseases from developing.  CAUSES   A problem with blood flow to the kidneys. This may be caused by:   Blood loss.   Heart disease.   Severe burns.   Liver disease.  Direct damage to the kidneys. This may be caused by:  Some medicines.   A kidney infection.   Poisoning or consuming toxic substances.   A surgical wound.   A blow to the kidney area.   A problem with urine flow. This may be caused by:   Cancer.   Kidney stones.   An enlarged prostate. SYMPTOMS   Swelling (edema) of the legs, ankles, or feet.   Tiredness (lethargy).   Nausea or vomiting.   Confusion.   Problems with urination, such as:   Painful or burning feeling during urination.   Decreased urine production.   Frequent  accidents in children who are potty trained.   Bloody urine.   Muscle twitches and cramps.   Shortness of breath.   Seizures.   Chest pain or pressure. Sometimes, no symptoms are present. DIAGNOSIS Acute kidney injury may be detected and diagnosed by tests, including blood, urine, imaging, or kidney biopsy tests.  TREATMENT Treatment of acute kidney injury varies depending on the cause and severity of the kidney damage. In mild cases, no treatment may be needed. The kidneys may heal on their own. If acute kidney injury is more severe, your caregiver will treat the cause of the kidney damage, help the kidneys heal, and prevent complications from occurring. Severe cases may require a procedure to remove toxic wastes from the body (dialysis) or surgery to repair kidney damage. Surgery may involve:   Repair of a torn kidney.   Removal of an obstruction. Most of the time, you will need to stay overnight at the hospital.  HOME CARE INSTRUCTIONS:  Follow your prescribed diet.  Only take over-the-counter or prescription  medicines as directed by your caregiver.  Do not take any new medicines (prescription, over-the-counter, or nutritional supplements) unless approved by your caregiver. Many medicines can worsen your kidney damage or need to have the dose adjusted.   Keep all follow-up appointments as directed by your caregiver.  Observe your condition to make sure you are healing as expected. SEEK IMMEDIATE MEDICAL CARE IF:  You are feeling ill or have severe pain in the back or side.   Your symptoms return or you have new symptoms.  You have any symptoms of end-stage kidney disease. These include:   Persistent itchiness.   Loss of appetite.   Headaches.   Abnormally dark or light skin.  Numbness in the hands or feet.   Easy bruising.   Frequent hiccups.   Menstruation stops.   You have a fever.  You have increased urine production.  You have pain  or bleeding when urinating. MAKE SURE YOU:   Understand these instructions.  Will watch your condition.  Will get help right away if you are not doing well or get worse Document Released: 01/27/2011 Document Revised: 11/08/2012 Document Reviewed: 03/12/2012 [facility] Patient Information 2015 [place], [place]. This information is not intended to replace advice given to you by your health care provider. Make sure you discuss any questions you have with your health care provider.

## 2015-04-20 NOTE — Clinical Social Work Note (Signed)
Patient unable to return to [facility] due to issues with money owed. CSW talked with patient's daughter [name] regarding another skilled facility and [facility] chosen. MD advised and patient will discharge to facility on Saturday, 9/24. CSW will contact facility [facility] on Saturday regarding discharge, as well as daughter Jadene Pierini [phone]).   Crawford Givens, MSW, LCSW Licensed Clinical Social Worker [place] [phone]

## 2015-04-20 NOTE — Discharge Summary (Signed)
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 direct sonographic guidance into the lower pole of the right inferior kidney. A 0.018 inch wire was advanced under fluoroscopic guidance into the left renal collecting system. The Accustick sheath was then advanced over the wire and a 0.018 system exchanged for a 0.035 system. Gentle hand injection of contrast material confirms placement of the sheath within the renal collecting system. A 10-French Cook all-purpose drain was then placed and positioned under fluoroscopic guidance. The locking loop is well formed within the left renal pelvis. The catheter was secured to the skin with 2-0 Prolene and a sterile bandage was placed. Catheter was left to gravity bag drainage.  A sample was sent for analysis.  Patient remained hemodynamically stable throughout.  No complications were encountered.  COMPLICATIONS: None  FINDINGS: Ultrasound survey demonstrates hydronephrosis of the bilateral kidneys.  Ten French percutaneous nephrostomy tube were well positioned within the left and subsequently right kidneys, as above.  IMPRESSION: Status post placement of bilateral percutaneous nephrostomy tube.  A sample was sent to the lab for analysis from each of the  kidneys.  Signed,  Dulcy Fanny. Earleen Newport, DO  [facility]  [facility]   Electronically Signed   By: Corrie Mckusick D.O.   On: 04/07/2015 10:16    Discharge Exam: Filed Vitals:   04/20/15 1008  BP: 126/79  Pulse: 95  Temp:   Resp: 11   Filed Vitals:   04/19/15 1700 04/19/15 2033 04/20/15 0553 04/20/15 1008  BP: 146/90 123/82 137/77 126/79  Pulse: 100 101 86 95  Temp: 98.2 F (36.8 C) 99 F (37.2 C) 98.4 F (36.9 C)   TempSrc: Oral Axillary Oral   Resp:   16 11  Height:      Weight:      SpO2: 100% 100% 93%     General: Pt is alert, follows commands appropriately, not in acute distress Cardiovascular: Regular rate and rhythm, no rubs, no gallops Respiratory: Clear to auscultation bilaterally, no wheezing, no crackles, no rhonchi Abdominal: Soft, non tender, non distended, bowel sounds +, no guarding  Discharge Instructions  Discharge Instructions    Diet - low sodium heart healthy    Complete by:  As  directed      Increase activity slowly    Complete by:  As directed             Medication List    STOP taking these medications        linezolid 600 MG tablet  Commonly known as:  ZYVOX     ondansetron 4 MG tablet  Commonly known as:  ZOFRAN     warfarin 2.5 MG tablet  Commonly known as:  COUMADIN      TAKE these medications        acetaminophen 325 MG tablet  Commonly known as:  TYLENOL  Take 650 mg by mouth every 6 (six) hours as needed for moderate pain.     alum & mag hydroxide-simeth 332-951-88 MG/5ML suspension  Commonly known as:  MAALOX PLUS  Take 20 mLs by mouth every 6 (six) hours as needed for indigestion.     amLODipine 5 MG tablet  Commonly known as:  NORVASC  Take 1 tablet (5 mg total) by mouth daily.     atorvastatin 10 MG tablet  Commonly known as:  LIPITOR  Take 10 mg by mouth daily at 6 PM.     baclofen 10 MG tablet  Commonly known as:  LIORESAL  Take 0.5 tablets (5 mg total) by mouth 3  (three) times daily.     Cholecalciferol 50000 UNITS capsule  Take 50,000 Units by mouth every 30 (thirty) days.     clonazePAM 0.5 MG tablet  Commonly known as:  KLONOPIN  Take 1/2 tablet by mouth twice daily     cyanocobalamin 1000 MCG tablet  Take 100 mcg by mouth daily.     docusate sodium 100 MG capsule  Commonly known as:  COLACE  Take 100 mg by mouth 2 (two) times daily.     famotidine 20 MG tablet  Commonly known as:  PEPCID  Take 20 mg by mouth at bedtime.     ferrous sulfate 325 (65 FE) MG EC tablet  Take 325 mg by mouth 2 (two) times daily.     folic acid 1 MG tablet  Commonly known as:  FOLVITE  Take 1 mg by mouth daily.     guaiFENesin 100 MG/5ML Soln  Commonly known as:  ROBITUSSIN  Take 15 mLs by mouth 3 (three) times daily as needed for cough or to loosen phlegm.     lactose free nutrition Liqd  Take 237 mLs by mouth 2 (two) times daily between meals.     lactulose 10 GM/15ML solution  Commonly known as:  CHRONULAC  Take 20 g by mouth 4 (four) times daily.     lamoTRIgine 200 MG tablet  Commonly known as:  LAMICTAL  Take 1 tablet (200 mg total) by mouth 2 (two) times daily.     lidocaine 5 %  Commonly known as:  LIDODERM  Place 2 patches onto the skin as needed (for pain). Remove & Discard patch within 12 hours or as directed by MD (apply 2 patches to left stump daily at 9pm     metoprolol tartrate 25 MG tablet  Commonly known as:  LOPRESSOR  Take 1 tablet (25 mg total) by mouth 2 (two) times daily.     nitroGLYCERIN 0.4 MG SL tablet  Commonly known as:  NITROSTAT  Place 0.4 mg under the tongue every 5 (five) minutes as needed for chest pain.     omeprazole 20 MG capsule  Commonly known as:  PRILOSEC  Take  20 mg by mouth daily.     oxybutynin 5 MG 24 hr tablet  Commonly known as:  DITROPAN-XL  Take 1 tablet (5 mg total) by mouth every morning.     oxyCODONE 5 MG immediate release tablet  Commonly known as:  Oxy IR/ROXICODONE  Take 1  tablet (5 mg total) by mouth every 4 (four) hours as needed for moderate pain or severe pain.     pregabalin 25 MG capsule  Commonly known as:  LYRICA  Take 25 mg by mouth 3 (three) times daily.     promethazine 25 MG/ML injection  Commonly known as:  PHENERGAN  Inject 25 mg into the muscle every 4 (four) hours as needed for nausea or vomiting.     QUEtiapine 300 MG tablet  Commonly known as:  SEROQUEL  Take 300 mg by mouth at bedtime.     senna 8.6 MG tablet  Commonly known as:  SENOKOT  Take 2 tablets by mouth every morning.     silver sulfADIAZINE 1 % cream  Commonly known as:  SILVADENE  Apply topically 2 (two) times daily.     SYSTANE 0.4-0.3 % Soln  Generic drug:  Polyethyl Glycol-Propyl Glycol  Apply 2 drops to eye 3 (three) times daily.     traZODone 50 MG tablet  Commonly known as:  DESYREL  Take 1 tablet (50 mg total) by mouth at bedtime.            Follow-up Information    Follow up with [name], MD.   Specialty:  Internal Medicine   Contact information:   [address] [place] [address] [phone]        The results of significant diagnostics from this hospitalization (including imaging, microbiology, ancillary and laboratory) are listed below for reference.     Microbiology: No results found for this or any previous visit (from the past 240 hour(s)).   Labs: Basic Metabolic Panel:  Recent Labs Lab 04/15/15 0545 04/16/15 0614 04/17/15 0541 04/18/15 0610 04/19/15 0549 04/20/15 0530  NA 140 142 139 139 135 137  K 3.1* 5.0 4.3 3.6 3.0* 3.4*  CL 97* 99* 102 100* 98* 100*  CO2 33* 34* 31 30 27 27   GLUCOSE 125* 117* 102* 94 98 101*  BUN 16 19 16 17 15 14   CREATININE 1.81* 2.15* 2.03* 2.53* 2.37* 2.07*  CALCIUM 7.4* 8.2* 8.2* 8.1* 7.9* 8.2*  MG 1.4* 2.5*  --   --   --   --    Liver Function Tests: No results for input(s): AST, ALT, ALKPHOS, BILITOT, PROT, ALBUMIN in the last 168 hours. No results for input(s):  LIPASE, AMYLASE in the last 168 hours. No results for input(s): AMMONIA in the last 168 hours. CBC:  Recent Labs Lab 04/17/15 0541 04/17/15 0840 04/17/15 2250 04/18/15 0610 04/19/15 0549 04/20/15 0530  WBC 8.7 8.3  --  9.6 9.8 8.3  HGB 6.0* 5.8* 9.0* 8.7* 9.0* 8.7*  HCT 20.0* 19.9* 28.6* 26.8* 28.2* 27.0*  MCV 90.1 89.2  --  85.6 84.9 87.7  PLT 135* 140*  --  147* 169 217   Cardiac Enzymes: No results for input(s): CKTOTAL, CKMB, CKMBINDEX, TROPONINI in the last 168 hours. BNP: BNP (last 3 results)  Recent Labs  09/17/14 1651  BNP 41.0    ProBNP (last 3 results) No results for input(s): PROBNP in the last 8760 hours.  CBG:  Recent Labs Lab 04/14/15 2035  GLUCAP 143*     SIGNED:  Time coordinating discharge: 30 minutes  Faye Ramsay, MD  [facility] 04/20/2015, 4:35 PM Pager [phone]  If 7PM-7AM, please contact night-coverage [email or web address] Password [email or web address]

## 2015-04-21 DIAGNOSIS — N178 Other acute kidney failure: Principal | ICD-10-CM

## 2015-04-21 LAB — CBC
HEMATOCRIT: 27.5 % — AB (ref 39.0–52.0)
HEMOGLOBIN: 8.8 g/dL — AB (ref 13.0–17.0)
MCH: 28 pg (ref 26.0–34.0)
MCHC: 32 g/dL (ref 30.0–36.0)
MCV: 87.6 fL (ref 78.0–100.0)
Platelets: 214 10*3/uL (ref 150–400)
RBC: 3.14 MIL/uL — ABNORMAL LOW (ref 4.22–5.81)
RDW: 17.5 % — ABNORMAL HIGH (ref 11.5–15.5)
WBC: 9.3 10*3/uL (ref 4.0–10.5)

## 2015-04-21 LAB — TYPE AND SCREEN
ABO/RH(D): O POS
Antibody Screen: NEGATIVE
UNIT DIVISION: 0
UNIT DIVISION: 0
UNIT DIVISION: 0
Unit division: 0

## 2015-04-21 LAB — BASIC METABOLIC PANEL
ANION GAP: 9 (ref 5–15)
BUN: 15 mg/dL (ref 6–20)
CO2: 27 mmol/L (ref 22–32)
Calcium: 7.9 mg/dL — ABNORMAL LOW (ref 8.9–10.3)
Chloride: 96 mmol/L — ABNORMAL LOW (ref 101–111)
Creatinine, Ser: 1.78 mg/dL — ABNORMAL HIGH (ref 0.61–1.24)
GFR calc Af Amer: 47 mL/min — ABNORMAL LOW (ref >60)
GFR calc non Af Amer: 41 mL/min — ABNORMAL LOW (ref >60)
GLUCOSE: 91 mg/dL (ref 65–99)
POTASSIUM: 3.9 mmol/L (ref 3.5–5.1)
Sodium: 132 mmol/L — ABNORMAL LOW (ref 135–145)

## 2015-04-21 MED ORDER — PROMETHAZINE HCL 25 MG/ML IJ SOLN
12.5000 mg | Freq: Once | INTRAMUSCULAR | Status: AC
  Administered 2015-04-21: 12.5 mg via INTRAMUSCULAR
  Filled 2015-04-21: qty 1

## 2015-04-21 MED ORDER — HEPARIN SOD (PORK) LOCK FLUSH 100 UNIT/ML IV SOLN
250.0000 [IU] | INTRAVENOUS | Status: AC | PRN
  Administered 2015-04-21: 500 [IU]

## 2015-04-21 NOTE — Progress Notes (Signed)
Pt discharged to skilled nursing facility ([facility]). Ambulance transport arrived to transport patient. IV team deaccessed portacath. Pt c/o nausea following medication administration. Order received from MD to administer IM phenergan x 1 prior to discharge. Attempted to call report to snf at [phone]. No answer. Call to social worker for alternate number. No other number known. Colostomy changed prior to transport, and patient sent with 3 additional colostomy kits per snf request d/t weekend. NSL removed from R shoulder with catheter intact. Bartholomew Crews, RN

## 2015-04-21 NOTE — Progress Notes (Signed)
Received call from [facility] requesting report. Unable to identify what skin ulcer to apply silvadene cream. Noted not applied x atleast 24 hours. Discussed changing colostomy bag and having sent 3 colostomy kits. Phenergan given IM prior to transport, and hydromorphone and zofran given earlier this AM. Provided call back number [phone] for further questions. Bartholomew Crews, RN

## 2015-04-21 NOTE — Progress Notes (Signed)
Second attempt to contact [facility] at [phone] to give report. No answer. Bartholomew Crews, RN

## 2015-04-21 NOTE — Discharge Summary (Signed)
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 achieved, a small skin nick was made with an 11 blade scalpel. A 21 gauge Accustick needle was then advanced under direct sonographic guidance into the lower pole of the right inferior kidney. A 0.018 inch wire was advanced under fluoroscopic guidance into the left renal collecting system. The Accustick sheath was then advanced over the wire and a 0.018 system exchanged for a 0.035 system. Gentle hand injection of contrast material confirms placement of the sheath within the renal collecting system. A 10-French Cook all-purpose drain was then placed and positioned under fluoroscopic guidance. The locking loop is well formed within the left renal pelvis. The catheter was secured to the skin with 2-0 Prolene and a sterile bandage was placed. Catheter was left to gravity bag drainage.  A sample was sent for analysis.  Patient remained hemodynamically stable throughout.  No complications were encountered.  COMPLICATIONS: None  FINDINGS: Ultrasound survey demonstrates hydronephrosis of the bilateral kidneys.  Ten French percutaneous nephrostomy tube were well positioned within the left and subsequently right kidneys, as above.  IMPRESSION: Status  post placement of bilateral percutaneous nephrostomy tube.  A sample was sent to the lab for analysis from each of the kidneys.  Signed,  Dulcy Fanny. Earleen Newport, DO  [facility]  [facility]   Electronically Signed   By: Corrie Mckusick D.O.   On: 04/07/2015 10:16    Discharge Exam: Filed Vitals:   04/21/15 0840  BP: 158/96  Pulse: 105  Temp: 97.4 F (36.3 C)  Resp: 20   Filed Vitals:   04/20/15 1700 04/20/15 2125 04/21/15 0620 04/21/15 0840  BP: 116/68 121/74 126/84 158/96  Pulse: 93 83 101 105  Temp: 97.5 F (36.4 C) 100.6 F (38.1 C) 97.6 F (36.4 C) 97.4 F (36.3 C)  TempSrc: Oral Oral Oral Oral  Resp: 20  18 20   Height:      Weight:      SpO2: 94% 92% 94% 93%    General: Pt is alert, follows commands appropriately, not in acute distress Cardiovascular: Regular rate and rhythm, no rubs, no gallops Respiratory: Clear to auscultation bilaterally, no wheezing, no crackles, no rhonchi Abdominal: Soft, non tender, non distended, bowel sounds +, no  guarding  Discharge Instructions      Discharge Instructions    Diet - low sodium heart healthy    Complete by:  As directed      Increase activity slowly    Complete by:  As directed             Medication List    STOP taking these medications        linezolid 600 MG tablet  Commonly known as:  ZYVOX     ondansetron 4 MG tablet  Commonly known as:  ZOFRAN     warfarin 2.5 MG tablet  Commonly known as:  COUMADIN      TAKE these medications        acetaminophen 325 MG tablet  Commonly known as:  TYLENOL  Take 650 mg by mouth every 6 (six) hours as needed for moderate pain.     alum & mag hydroxide-simeth 027-741-28 MG/5ML suspension  Commonly known as:  MAALOX PLUS  Take 20 mLs by mouth every 6 (six) hours as needed for indigestion.     amLODipine 5 MG tablet  Commonly known as:  NORVASC  Take 1 tablet (5 mg total) by mouth daily.     atorvastatin 10 MG tablet   Commonly known as:  LIPITOR  Take 10 mg by mouth daily at 6 PM.     baclofen 10 MG tablet  Commonly known as:  LIORESAL  Take 0.5 tablets (5 mg total) by mouth 3 (three) times daily.     Cholecalciferol 50000 UNITS capsule  Take 50,000 Units by mouth every 30 (thirty) days.     clonazePAM 0.5 MG tablet  Commonly known as:  KLONOPIN  Take 1/2 tablet by mouth twice daily     cyanocobalamin 1000 MCG tablet  Take 100 mcg by mouth daily.     docusate sodium 100 MG capsule  Commonly known as:  COLACE  Take 100 mg by mouth 2 (two) times daily.     famotidine 20 MG tablet  Commonly known as:  PEPCID  Take 20 mg by mouth at bedtime.     ferrous sulfate 325 (65 FE) MG EC tablet  Take 325 mg by mouth 2 (two) times daily.     folic acid 1 MG tablet  Commonly known as:  FOLVITE  Take 1 mg by mouth daily.     guaiFENesin 100 MG/5ML Soln  Commonly known as:  ROBITUSSIN  Take 15 mLs by mouth 3 (three) times daily as needed for cough or to loosen phlegm.     lactose free nutrition Liqd  Take 237 mLs by mouth 2 (two) times daily between meals.     lactulose 10 GM/15ML solution  Commonly known as:  CHRONULAC  Take 20 g by mouth 4 (four) times daily.     lamoTRIgine 200 MG tablet  Commonly known as:  LAMICTAL  Take 1 tablet (200 mg total) by mouth 2 (two) times daily.     lidocaine 5 %  Commonly known as:  LIDODERM  Place 2 patches onto the skin as needed (for pain). Remove & Discard patch within 12 hours or as directed by MD (apply 2 patches to left stump daily at 9pm     metoprolol tartrate 25 MG tablet  Commonly known as:  LOPRESSOR  Take 1 tablet (25 mg total) by mouth 2 (two) times daily.     nitroGLYCERIN 0.4 MG SL tablet  Commonly known as:  NITROSTAT  Place 0.4 mg under  the tongue every 5 (five) minutes as needed for chest pain.     omeprazole 20 MG capsule  Commonly known as:  PRILOSEC  Take 20 mg by mouth daily.     oxybutynin 5 MG 24 hr tablet  Commonly known  as:  DITROPAN-XL  Take 1 tablet (5 mg total) by mouth every morning.     oxyCODONE 5 MG immediate release tablet  Commonly known as:  Oxy IR/ROXICODONE  Take 1 tablet (5 mg total) by mouth every 4 (four) hours as needed for moderate pain or severe pain.     pregabalin 25 MG capsule  Commonly known as:  LYRICA  Take 25 mg by mouth 3 (three) times daily.     promethazine 25 MG/ML injection  Commonly known as:  PHENERGAN  Inject 25 mg into the muscle every 4 (four) hours as needed for nausea or vomiting.     QUEtiapine 300 MG tablet  Commonly known as:  SEROQUEL  Take 300 mg by mouth at bedtime.     senna 8.6 MG tablet  Commonly known as:  SENOKOT  Take 2 tablets by mouth every morning.     silver sulfADIAZINE 1 % cream  Commonly known as:  SILVADENE  Apply topically 2 (two) times daily.     SYSTANE 0.4-0.3 % Soln  Generic drug:  Polyethyl Glycol-Propyl Glycol  Apply 2 drops to eye 3 (three) times daily.     traZODone 50 MG tablet  Commonly known as:  DESYREL  Take 1 tablet (50 mg total) by mouth at bedtime.        Follow-up Information    Follow up with [name], MD.   Specialty:  Internal Medicine   Contact information:   [place] [address] [phone]        The results of significant diagnostics from this hospitalization (including imaging, microbiology, ancillary and laboratory) are listed below for reference.     Microbiology: No results found for this or any previous visit (from the past 240 hour(s)).   Labs: Basic Metabolic Panel:  Recent Labs Lab 04/15/15 0545 04/16/15 1308 04/17/15 0541 04/18/15 0610 04/19/15 0549 04/20/15 0530 04/21/15 0515  NA 140 142 139 139 135 137 132*  K 3.1* 5.0 4.3 3.6 3.0* 3.4* 3.9  CL 97* 99* 102 100* 98* 100* 96*  CO2 33* 34* 31 30 27 27 27   GLUCOSE 125* 117* 102* 94 98 101* 91  BUN 16 19 16 17 15 14 15   CREATININE 1.81* 2.15* 2.03* 2.53* 2.37* 2.07* 1.78*  CALCIUM 7.4* 8.2*  8.2* 8.1* 7.9* 8.2* 7.9*  MG 1.4* 2.5*  --   --   --   --   --    Liver Function Tests: No results for input(s): AST, ALT, ALKPHOS, BILITOT, PROT, ALBUMIN in the last 168 hours. No results for input(s): LIPASE, AMYLASE in the last 168 hours. No results for input(s): AMMONIA in the last 168 hours. CBC:  Recent Labs Lab 04/17/15 0840 04/17/15 2250 04/18/15 0610 04/19/15 0549 04/20/15 0530 04/21/15 0515  WBC 8.3  --  9.6 9.8 8.3 9.3  HGB 5.8* 9.0* 8.7* 9.0* 8.7* 8.8*  HCT 19.9* 28.6* 26.8* 28.2* 27.0* 27.5*  MCV 89.2  --  85.6 84.9 87.7 87.6  PLT 140*  --  147* 169 217 214   Cardiac Enzymes: No results for input(s): CKTOTAL, CKMB, CKMBINDEX, TROPONINI in the last 168 hours. BNP: BNP (last 3 results)  Recent Labs  09/17/14 1651  BNP 41.0    ProBNP (last 3 results) No results for input(s): PROBNP in the last 8760 hours.  CBG:  Recent Labs Lab 04/14/15 2035  GLUCAP 143*     SIGNED: Time coordinating discharge: No charge  Phillips Climes, MD  [facility] 04/21/2015, 9:52 AM Pager [phone]  If 7PM-7AM, please contact night-coverage [email or web address] Password [email or web address]

## 2015-04-21 NOTE — Progress Notes (Signed)
CSW (Clinical Education officer, museum) prepared pt dc packet and placed with shadow chart. CSW arranged non-emergent ambulance transport. Pt, pt family, pt nurse, and facility informed. CSW signing off.  [place], LCSW Weekend CSW [phone]

## 2015-04-23 ENCOUNTER — Inpatient Hospital Stay (HOSPITAL_COMMUNITY)
Admission: EM | Admit: 2015-04-23 | Discharge: 2015-04-27 | DRG: 871 | Disposition: A | Discharge status: Skilled Nursing Facility | Payer: Medicare Other | Attending: Internal Medicine | Admitting: Internal Medicine

## 2015-04-23 ENCOUNTER — Encounter: Payer: Self-pay | Admitting: Internal Medicine

## 2015-04-23 ENCOUNTER — Encounter (HOSPITAL_COMMUNITY): Payer: Self-pay

## 2015-04-23 ENCOUNTER — Emergency Department (HOSPITAL_COMMUNITY): Payer: Medicare Other

## 2015-04-23 ENCOUNTER — Non-Acute Institutional Stay (SKILLED_NURSING_FACILITY): Payer: Medicare Other | Admitting: Internal Medicine

## 2015-04-23 DIAGNOSIS — L89159 Pressure ulcer of sacral region, unspecified stage: Secondary | ICD-10-CM | POA: Diagnosis present

## 2015-04-23 DIAGNOSIS — E785 Hyperlipidemia, unspecified: Secondary | ICD-10-CM | POA: Diagnosis not present

## 2015-04-23 DIAGNOSIS — G4700 Insomnia, unspecified: Secondary | ICD-10-CM

## 2015-04-23 DIAGNOSIS — Z6835 Body mass index (BMI) 35.0-35.9, adult: Secondary | ICD-10-CM

## 2015-04-23 DIAGNOSIS — J189 Pneumonia, unspecified organism: Secondary | ICD-10-CM

## 2015-04-23 DIAGNOSIS — Z89612 Acquired absence of left leg above knee: Secondary | ICD-10-CM

## 2015-04-23 DIAGNOSIS — N139 Obstructive and reflux uropathy, unspecified: Secondary | ICD-10-CM | POA: Diagnosis not present

## 2015-04-23 DIAGNOSIS — G894 Chronic pain syndrome: Secondary | ICD-10-CM | POA: Diagnosis not present

## 2015-04-23 DIAGNOSIS — E669 Obesity, unspecified: Secondary | ICD-10-CM | POA: Diagnosis present

## 2015-04-23 DIAGNOSIS — E871 Hypo-osmolality and hyponatremia: Secondary | ICD-10-CM | POA: Diagnosis present

## 2015-04-23 DIAGNOSIS — Z79899 Other long term (current) drug therapy: Secondary | ICD-10-CM

## 2015-04-23 DIAGNOSIS — N390 Urinary tract infection, site not specified: Secondary | ICD-10-CM

## 2015-04-23 DIAGNOSIS — F319 Bipolar disorder, unspecified: Secondary | ICD-10-CM | POA: Diagnosis present

## 2015-04-23 DIAGNOSIS — I10 Essential (primary) hypertension: Secondary | ICD-10-CM | POA: Diagnosis present

## 2015-04-23 DIAGNOSIS — B1920 Unspecified viral hepatitis C without hepatic coma: Secondary | ICD-10-CM | POA: Diagnosis present

## 2015-04-23 DIAGNOSIS — A419 Sepsis, unspecified organism: Secondary | ICD-10-CM | POA: Diagnosis not present

## 2015-04-23 DIAGNOSIS — Z515 Encounter for palliative care: Secondary | ICD-10-CM

## 2015-04-23 DIAGNOSIS — D649 Anemia, unspecified: Secondary | ICD-10-CM

## 2015-04-23 DIAGNOSIS — C689 Malignant neoplasm of urinary organ, unspecified: Secondary | ICD-10-CM | POA: Diagnosis present

## 2015-04-23 DIAGNOSIS — S31000A Unspecified open wound of lower back and pelvis without penetration into retroperitoneum, initial encounter: Secondary | ICD-10-CM | POA: Diagnosis present

## 2015-04-23 DIAGNOSIS — G546 Phantom limb syndrome with pain: Secondary | ICD-10-CM | POA: Diagnosis present

## 2015-04-23 DIAGNOSIS — F3112 Bipolar disorder, current episode manic without psychotic features, moderate: Secondary | ICD-10-CM | POA: Diagnosis not present

## 2015-04-23 DIAGNOSIS — G9341 Metabolic encephalopathy: Secondary | ICD-10-CM | POA: Diagnosis present

## 2015-04-23 DIAGNOSIS — Z9221 Personal history of antineoplastic chemotherapy: Secondary | ICD-10-CM

## 2015-04-23 DIAGNOSIS — I2782 Chronic pulmonary embolism: Secondary | ICD-10-CM | POA: Diagnosis not present

## 2015-04-23 DIAGNOSIS — G8220 Paraplegia, unspecified: Secondary | ICD-10-CM | POA: Diagnosis present

## 2015-04-23 DIAGNOSIS — R1310 Dysphagia, unspecified: Secondary | ICD-10-CM | POA: Diagnosis present

## 2015-04-23 DIAGNOSIS — N1330 Unspecified hydronephrosis: Secondary | ICD-10-CM | POA: Diagnosis present

## 2015-04-23 DIAGNOSIS — B1710 Acute hepatitis C without hepatic coma: Secondary | ICD-10-CM | POA: Diagnosis not present

## 2015-04-23 DIAGNOSIS — L89899 Pressure ulcer of other site, unspecified stage: Secondary | ICD-10-CM | POA: Diagnosis present

## 2015-04-23 DIAGNOSIS — C679 Malignant neoplasm of bladder, unspecified: Secondary | ICD-10-CM | POA: Diagnosis present

## 2015-04-23 DIAGNOSIS — G8929 Other chronic pain: Secondary | ICD-10-CM | POA: Diagnosis present

## 2015-04-23 DIAGNOSIS — Z91018 Allergy to other foods: Secondary | ICD-10-CM

## 2015-04-23 DIAGNOSIS — E876 Hypokalemia: Secondary | ICD-10-CM | POA: Diagnosis present

## 2015-04-23 DIAGNOSIS — C688 Malignant neoplasm of overlapping sites of urinary organs: Secondary | ICD-10-CM

## 2015-04-23 DIAGNOSIS — E2740 Unspecified adrenocortical insufficiency: Secondary | ICD-10-CM | POA: Diagnosis present

## 2015-04-23 DIAGNOSIS — N136 Pyonephrosis: Secondary | ICD-10-CM | POA: Diagnosis present

## 2015-04-23 DIAGNOSIS — Z86711 Personal history of pulmonary embolism: Secondary | ICD-10-CM

## 2015-04-23 DIAGNOSIS — R569 Unspecified convulsions: Secondary | ICD-10-CM

## 2015-04-23 DIAGNOSIS — G40909 Epilepsy, unspecified, not intractable, without status epilepticus: Secondary | ICD-10-CM | POA: Diagnosis present

## 2015-04-23 DIAGNOSIS — Z933 Colostomy status: Secondary | ICD-10-CM

## 2015-04-23 DIAGNOSIS — J690 Pneumonitis due to inhalation of food and vomit: Secondary | ICD-10-CM | POA: Diagnosis present

## 2015-04-23 DIAGNOSIS — G9340 Encephalopathy, unspecified: Secondary | ICD-10-CM | POA: Diagnosis present

## 2015-04-23 DIAGNOSIS — Z936 Other artificial openings of urinary tract status: Secondary | ICD-10-CM

## 2015-04-23 DIAGNOSIS — D689 Coagulation defect, unspecified: Secondary | ICD-10-CM | POA: Diagnosis present

## 2015-04-23 DIAGNOSIS — D500 Iron deficiency anemia secondary to blood loss (chronic): Secondary | ICD-10-CM | POA: Diagnosis present

## 2015-04-23 DIAGNOSIS — E538 Deficiency of other specified B group vitamins: Secondary | ICD-10-CM | POA: Diagnosis present

## 2015-04-23 DIAGNOSIS — A491 Streptococcal infection, unspecified site: Secondary | ICD-10-CM | POA: Diagnosis present

## 2015-04-23 DIAGNOSIS — N319 Neuromuscular dysfunction of bladder, unspecified: Secondary | ICD-10-CM | POA: Diagnosis present

## 2015-04-23 DIAGNOSIS — K219 Gastro-esophageal reflux disease without esophagitis: Secondary | ICD-10-CM | POA: Diagnosis present

## 2015-04-23 DIAGNOSIS — Z1621 Resistance to vancomycin: Secondary | ICD-10-CM

## 2015-04-23 DIAGNOSIS — Z66 Do not resuscitate: Secondary | ICD-10-CM | POA: Diagnosis present

## 2015-04-23 DIAGNOSIS — Z9049 Acquired absence of other specified parts of digestive tract: Secondary | ICD-10-CM | POA: Diagnosis present

## 2015-04-23 HISTORY — DX: Disorder of kidney and ureter, unspecified: N28.9

## 2015-04-23 LAB — COMPREHENSIVE METABOLIC PANEL
ALBUMIN: 2.1 g/dL — AB (ref 3.5–5.0)
ALT: 8 U/L — ABNORMAL LOW (ref 17–63)
ANION GAP: 11 (ref 5–15)
AST: 20 U/L (ref 15–41)
Alkaline Phosphatase: 88 U/L (ref 38–126)
BILIRUBIN TOTAL: 0.4 mg/dL (ref 0.3–1.2)
BUN: 10 mg/dL (ref 6–20)
CO2: 25 mmol/L (ref 22–32)
Calcium: 8.3 mg/dL — ABNORMAL LOW (ref 8.9–10.3)
Chloride: 97 mmol/L — ABNORMAL LOW (ref 101–111)
Creatinine, Ser: 1.64 mg/dL — ABNORMAL HIGH (ref 0.61–1.24)
GFR calc Af Amer: 52 mL/min — ABNORMAL LOW (ref >60)
GFR calc non Af Amer: 45 mL/min — ABNORMAL LOW (ref >60)
GLUCOSE: 98 mg/dL (ref 65–99)
POTASSIUM: 3.2 mmol/L — AB (ref 3.5–5.1)
SODIUM: 133 mmol/L — AB (ref 135–145)
TOTAL PROTEIN: 7.8 g/dL (ref 6.5–8.1)

## 2015-04-23 LAB — URINE MICROSCOPIC-ADD ON

## 2015-04-23 LAB — CBC
HEMATOCRIT: 29 % — AB (ref 39.0–52.0)
HEMOGLOBIN: 9.2 g/dL — AB (ref 13.0–17.0)
MCH: 27.2 pg (ref 26.0–34.0)
MCHC: 31.7 g/dL (ref 30.0–36.0)
MCV: 85.8 fL (ref 78.0–100.0)
Platelets: 304 10*3/uL (ref 150–400)
RBC: 3.38 MIL/uL — ABNORMAL LOW (ref 4.22–5.81)
RDW: 16.9 % — ABNORMAL HIGH (ref 11.5–15.5)
WBC: 8.3 10*3/uL (ref 4.0–10.5)

## 2015-04-23 LAB — URINALYSIS, ROUTINE W REFLEX MICROSCOPIC
Bilirubin Urine: NEGATIVE
Glucose, UA: NEGATIVE mg/dL
Ketones, ur: NEGATIVE mg/dL
Nitrite: NEGATIVE
Protein, ur: 100 mg/dL — AB
SPECIFIC GRAVITY, URINE: 1.016 (ref 1.005–1.030)
UROBILINOGEN UA: 0.2 mg/dL (ref 0.0–1.0)
pH: 7 (ref 5.0–8.0)

## 2015-04-23 LAB — LIPASE, BLOOD: Lipase: 18 U/L — ABNORMAL LOW (ref 22–51)

## 2015-04-23 LAB — I-STAT CG4 LACTIC ACID, ED: LACTIC ACID, VENOUS: 1.43 mmol/L (ref 0.5–2.0)

## 2015-04-23 MED ORDER — PROMETHAZINE HCL 25 MG/ML IJ SOLN
12.5000 mg | Freq: Once | INTRAMUSCULAR | Status: AC
  Administered 2015-04-23: 12.5 mg via INTRAVENOUS
  Filled 2015-04-23: qty 1

## 2015-04-23 MED ORDER — VANCOMYCIN HCL 10 G IV SOLR
2000.0000 mg | Freq: Once | INTRAVENOUS | Status: AC
  Administered 2015-04-23: 2000 mg via INTRAVENOUS
  Filled 2015-04-23: qty 2000

## 2015-04-23 MED ORDER — HYDROMORPHONE HCL 1 MG/ML IJ SOLN
1.0000 mg | Freq: Once | INTRAMUSCULAR | Status: AC
  Administered 2015-04-23: 1 mg via INTRAVENOUS
  Filled 2015-04-23: qty 1

## 2015-04-23 MED ORDER — SODIUM CHLORIDE 0.9 % IV BOLUS (SEPSIS)
1000.0000 mL | Freq: Once | INTRAVENOUS | Status: AC
  Administered 2015-04-23: 1000 mL via INTRAVENOUS

## 2015-04-23 MED ORDER — PIPERACILLIN-TAZOBACTAM 3.375 G IVPB 30 MIN
3.3750 g | Freq: Once | INTRAVENOUS | Status: AC
  Administered 2015-04-23: 3.375 g via INTRAVENOUS
  Filled 2015-04-23: qty 50

## 2015-04-23 NOTE — ED Notes (Signed)
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
## 2015-04-23 NOTE — ED Notes (Signed)
Patient c/o pain.  MD aware and order placed for IV team to access port.

## 2015-04-24 DIAGNOSIS — C679 Malignant neoplasm of bladder, unspecified: Secondary | ICD-10-CM | POA: Diagnosis present

## 2015-04-24 DIAGNOSIS — Z933 Colostomy status: Secondary | ICD-10-CM | POA: Diagnosis not present

## 2015-04-24 DIAGNOSIS — G8220 Paraplegia, unspecified: Secondary | ICD-10-CM | POA: Diagnosis present

## 2015-04-24 DIAGNOSIS — G9340 Encephalopathy, unspecified: Secondary | ICD-10-CM | POA: Diagnosis not present

## 2015-04-24 DIAGNOSIS — G546 Phantom limb syndrome with pain: Secondary | ICD-10-CM | POA: Diagnosis present

## 2015-04-24 DIAGNOSIS — E2740 Unspecified adrenocortical insufficiency: Secondary | ICD-10-CM | POA: Diagnosis present

## 2015-04-24 DIAGNOSIS — G893 Neoplasm related pain (acute) (chronic): Secondary | ICD-10-CM | POA: Diagnosis not present

## 2015-04-24 DIAGNOSIS — Z89612 Acquired absence of left leg above knee: Secondary | ICD-10-CM | POA: Diagnosis not present

## 2015-04-24 DIAGNOSIS — G9341 Metabolic encephalopathy: Secondary | ICD-10-CM | POA: Diagnosis present

## 2015-04-24 DIAGNOSIS — E669 Obesity, unspecified: Secondary | ICD-10-CM | POA: Diagnosis present

## 2015-04-24 DIAGNOSIS — A408 Other streptococcal sepsis: Secondary | ICD-10-CM | POA: Diagnosis not present

## 2015-04-24 DIAGNOSIS — G40909 Epilepsy, unspecified, not intractable, without status epilepticus: Secondary | ICD-10-CM | POA: Diagnosis present

## 2015-04-24 DIAGNOSIS — B1920 Unspecified viral hepatitis C without hepatic coma: Secondary | ICD-10-CM | POA: Diagnosis present

## 2015-04-24 DIAGNOSIS — L89899 Pressure ulcer of other site, unspecified stage: Secondary | ICD-10-CM | POA: Diagnosis present

## 2015-04-24 DIAGNOSIS — Z6835 Body mass index (BMI) 35.0-35.9, adult: Secondary | ICD-10-CM | POA: Diagnosis not present

## 2015-04-24 DIAGNOSIS — N136 Pyonephrosis: Secondary | ICD-10-CM | POA: Diagnosis present

## 2015-04-24 DIAGNOSIS — D689 Coagulation defect, unspecified: Secondary | ICD-10-CM | POA: Diagnosis present

## 2015-04-24 DIAGNOSIS — Z9049 Acquired absence of other specified parts of digestive tract: Secondary | ICD-10-CM | POA: Diagnosis present

## 2015-04-24 DIAGNOSIS — N1330 Unspecified hydronephrosis: Secondary | ICD-10-CM | POA: Diagnosis not present

## 2015-04-24 DIAGNOSIS — Z66 Do not resuscitate: Secondary | ICD-10-CM | POA: Diagnosis present

## 2015-04-24 DIAGNOSIS — E871 Hypo-osmolality and hyponatremia: Secondary | ICD-10-CM | POA: Diagnosis present

## 2015-04-24 DIAGNOSIS — K219 Gastro-esophageal reflux disease without esophagitis: Secondary | ICD-10-CM | POA: Diagnosis present

## 2015-04-24 DIAGNOSIS — E785 Hyperlipidemia, unspecified: Secondary | ICD-10-CM | POA: Diagnosis present

## 2015-04-24 DIAGNOSIS — C688 Malignant neoplasm of overlapping sites of urinary organs: Secondary | ICD-10-CM | POA: Diagnosis not present

## 2015-04-24 DIAGNOSIS — J690 Pneumonitis due to inhalation of food and vomit: Secondary | ICD-10-CM | POA: Diagnosis not present

## 2015-04-24 DIAGNOSIS — N319 Neuromuscular dysfunction of bladder, unspecified: Secondary | ICD-10-CM | POA: Diagnosis present

## 2015-04-24 DIAGNOSIS — Z515 Encounter for palliative care: Secondary | ICD-10-CM | POA: Diagnosis not present

## 2015-04-24 DIAGNOSIS — J189 Pneumonia, unspecified organism: Secondary | ICD-10-CM | POA: Diagnosis not present

## 2015-04-24 DIAGNOSIS — R1310 Dysphagia, unspecified: Secondary | ICD-10-CM | POA: Diagnosis present

## 2015-04-24 DIAGNOSIS — Z9221 Personal history of antineoplastic chemotherapy: Secondary | ICD-10-CM | POA: Diagnosis not present

## 2015-04-24 DIAGNOSIS — A419 Sepsis, unspecified organism: Secondary | ICD-10-CM | POA: Diagnosis not present

## 2015-04-24 DIAGNOSIS — F319 Bipolar disorder, unspecified: Secondary | ICD-10-CM | POA: Diagnosis present

## 2015-04-24 DIAGNOSIS — Z79899 Other long term (current) drug therapy: Secondary | ICD-10-CM | POA: Diagnosis not present

## 2015-04-24 DIAGNOSIS — Z86711 Personal history of pulmonary embolism: Secondary | ICD-10-CM | POA: Diagnosis not present

## 2015-04-24 DIAGNOSIS — D500 Iron deficiency anemia secondary to blood loss (chronic): Secondary | ICD-10-CM | POA: Diagnosis present

## 2015-04-24 DIAGNOSIS — G4700 Insomnia, unspecified: Secondary | ICD-10-CM | POA: Diagnosis present

## 2015-04-24 DIAGNOSIS — Z936 Other artificial openings of urinary tract status: Secondary | ICD-10-CM | POA: Diagnosis not present

## 2015-04-24 DIAGNOSIS — Z91018 Allergy to other foods: Secondary | ICD-10-CM | POA: Diagnosis not present

## 2015-04-24 DIAGNOSIS — I10 Essential (primary) hypertension: Secondary | ICD-10-CM | POA: Diagnosis not present

## 2015-04-24 DIAGNOSIS — L89159 Pressure ulcer of sacral region, unspecified stage: Secondary | ICD-10-CM | POA: Diagnosis present

## 2015-04-24 DIAGNOSIS — G8929 Other chronic pain: Secondary | ICD-10-CM | POA: Diagnosis present

## 2015-04-24 DIAGNOSIS — E876 Hypokalemia: Secondary | ICD-10-CM | POA: Diagnosis present

## 2015-04-24 DIAGNOSIS — E538 Deficiency of other specified B group vitamins: Secondary | ICD-10-CM | POA: Diagnosis present

## 2015-04-24 LAB — COMPREHENSIVE METABOLIC PANEL
ALK PHOS: 81 U/L (ref 38–126)
ALT: 7 U/L — ABNORMAL LOW (ref 17–63)
ANION GAP: 12 (ref 5–15)
AST: 18 U/L (ref 15–41)
Albumin: 2 g/dL — ABNORMAL LOW (ref 3.5–5.0)
BILIRUBIN TOTAL: 0.7 mg/dL (ref 0.3–1.2)
BUN: 9 mg/dL (ref 6–20)
CALCIUM: 7.5 mg/dL — AB (ref 8.9–10.3)
CO2: 19 mmol/L — ABNORMAL LOW (ref 22–32)
Chloride: 100 mmol/L — ABNORMAL LOW (ref 101–111)
Creatinine, Ser: 1.55 mg/dL — ABNORMAL HIGH (ref 0.61–1.24)
GFR calc non Af Amer: 48 mL/min — ABNORMAL LOW (ref >60)
GFR, EST AFRICAN AMERICAN: 56 mL/min — AB (ref >60)
Glucose, Bld: 116 mg/dL — ABNORMAL HIGH (ref 65–99)
POTASSIUM: 3 mmol/L — AB (ref 3.5–5.1)
SODIUM: 131 mmol/L — AB (ref 135–145)
TOTAL PROTEIN: 6.8 g/dL (ref 6.5–8.1)

## 2015-04-24 LAB — CBC
HEMATOCRIT: 27.1 % — AB (ref 39.0–52.0)
HEMOGLOBIN: 8.7 g/dL — AB (ref 13.0–17.0)
MCH: 27.9 pg (ref 26.0–34.0)
MCHC: 32.1 g/dL (ref 30.0–36.0)
MCV: 86.9 fL (ref 78.0–100.0)
Platelets: 244 10*3/uL (ref 150–400)
RBC: 3.12 MIL/uL — AB (ref 4.22–5.81)
RDW: 16.8 % — ABNORMAL HIGH (ref 11.5–15.5)
WBC: 8.6 10*3/uL (ref 4.0–10.5)

## 2015-04-24 LAB — PROCALCITONIN: PROCALCITONIN: 1.18 ng/mL

## 2015-04-24 LAB — MRSA PCR SCREENING: MRSA BY PCR: NEGATIVE

## 2015-04-24 LAB — I-STAT CG4 LACTIC ACID, ED: Lactic Acid, Venous: 0.83 mmol/L (ref 0.5–2.0)

## 2015-04-24 LAB — PROTIME-INR
INR: 2.17 — AB (ref 0.00–1.49)
PROTHROMBIN TIME: 24 s — AB (ref 11.6–15.2)

## 2015-04-24 MED ORDER — INFLUENZA VAC SPLIT QUAD 0.5 ML IM SUSY
0.5000 mL | PREFILLED_SYRINGE | INTRAMUSCULAR | Status: DC
  Filled 2015-04-24: qty 0.5

## 2015-04-24 MED ORDER — OXYCODONE HCL 5 MG PO TABS
5.0000 mg | ORAL_TABLET | ORAL | Status: DC | PRN
  Administered 2015-04-24 – 2015-04-25 (×3): 5 mg via ORAL
  Filled 2015-04-24 (×3): qty 1

## 2015-04-24 MED ORDER — NITROGLYCERIN 0.4 MG SL SUBL: 0.4000 mg | SUBLINGUAL_TABLET | SUBLINGUAL | Status: DC | PRN

## 2015-04-24 MED ORDER — POLYETHYL GLYCOL-PROPYL GLYCOL 0.4-0.3 % OP SOLN: 2.0000 [drp] | Freq: Three times a day (TID) | OPHTHALMIC | Status: DC

## 2015-04-24 MED ORDER — BOOST PO LIQD
237.0000 mL | Freq: Two times a day (BID) | ORAL | Status: DC
  Administered 2015-04-24 – 2015-04-27 (×5): 237 mL via ORAL
  Filled 2015-04-24 (×10): qty 237

## 2015-04-24 MED ORDER — SILVER SULFADIAZINE 1 % EX CREA
TOPICAL_CREAM | Freq: Two times a day (BID) | CUTANEOUS | Status: DC
  Administered 2015-04-24 – 2015-04-25 (×3): via TOPICAL
  Administered 2015-04-26 – 2015-04-27 (×2): 1 via TOPICAL
  Filled 2015-04-24: qty 85

## 2015-04-24 MED ORDER — ENOXAPARIN SODIUM 30 MG/0.3ML ~~LOC~~ SOLN
30.0000 mg | SUBCUTANEOUS | Status: DC
  Administered 2015-04-24: 30 mg via SUBCUTANEOUS

## 2015-04-24 MED ORDER — PANTOPRAZOLE SODIUM 40 MG PO TBEC
40.0000 mg | DELAYED_RELEASE_TABLET | Freq: Every day | ORAL | Status: DC
  Administered 2015-04-24 – 2015-04-27 (×3): 40 mg via ORAL
  Filled 2015-04-24 (×3): qty 1

## 2015-04-24 MED ORDER — ALUM & MAG HYDROXIDE-SIMETH 400-400-40 MG/5ML PO SUSP: 20.0000 mL | Freq: Four times a day (QID) | ORAL | Status: DC | PRN

## 2015-04-24 MED ORDER — FAMOTIDINE 20 MG PO TABS
20.0000 mg | ORAL_TABLET | Freq: Every day | ORAL | Status: DC
  Administered 2015-04-24 (×2): 20 mg via ORAL
  Filled 2015-04-24 (×3): qty 1

## 2015-04-24 MED ORDER — PREGABALIN 25 MG PO CAPS
25.0000 mg | ORAL_CAPSULE | Freq: Three times a day (TID) | ORAL | Status: DC
  Administered 2015-04-24 – 2015-04-27 (×9): 25 mg via ORAL
  Filled 2015-04-24 (×8): qty 1

## 2015-04-24 MED ORDER — GUAIFENESIN 100 MG/5ML PO SOLN
15.0000 mL | Freq: Three times a day (TID) | ORAL | Status: DC | PRN
  Filled 2015-04-24: qty 15

## 2015-04-24 MED ORDER — POLYVINYL ALCOHOL 1.4 % OP SOLN
2.0000 [drp] | Freq: Three times a day (TID) | OPHTHALMIC | Status: DC
  Administered 2015-04-24 – 2015-04-27 (×10): 2 [drp] via OPHTHALMIC
  Filled 2015-04-24: qty 15

## 2015-04-24 MED ORDER — TRAZODONE HCL 50 MG PO TABS
50.0000 mg | ORAL_TABLET | Freq: Every day | ORAL | Status: DC
  Administered 2015-04-24 – 2015-04-26 (×3): 50 mg via ORAL
  Filled 2015-04-24 (×5): qty 1

## 2015-04-24 MED ORDER — SENNOSIDES 8.6 MG PO TABS: 2.0000 | ORAL_TABLET | Freq: Every morning | ORAL | Status: DC

## 2015-04-24 MED ORDER — LACTULOSE 10 GM/15ML PO SOLN
20.0000 g | Freq: Four times a day (QID) | ORAL | Status: DC
  Administered 2015-04-24: 20 g via ORAL
  Filled 2015-04-24 (×11): qty 30

## 2015-04-24 MED ORDER — ACETAMINOPHEN 325 MG PO TABS
650.0000 mg | ORAL_TABLET | Freq: Four times a day (QID) | ORAL | Status: DC | PRN
  Filled 2015-04-24: qty 2

## 2015-04-24 MED ORDER — OXYBUTYNIN CHLORIDE ER 5 MG PO TB24
5.0000 mg | ORAL_TABLET | Freq: Every morning | ORAL | Status: DC
  Administered 2015-04-24 – 2015-04-27 (×4): 5 mg via ORAL
  Filled 2015-04-24 (×4): qty 1

## 2015-04-24 MED ORDER — CLONAZEPAM 0.5 MG PO TABS
0.2500 mg | ORAL_TABLET | Freq: Two times a day (BID) | ORAL | Status: DC
  Administered 2015-04-24 – 2015-04-27 (×8): 0.25 mg via ORAL
  Filled 2015-04-24 (×8): qty 1

## 2015-04-24 MED ORDER — SODIUM CHLORIDE 0.9 % IJ SOLN
3.0000 mL | Freq: Two times a day (BID) | INTRAMUSCULAR | Status: DC
  Administered 2015-04-24: 3 mL via INTRAVENOUS
  Administered 2015-04-25: 10 mL via INTRAVENOUS
  Administered 2015-04-25 (×2): 3 mL via INTRAVENOUS

## 2015-04-24 MED ORDER — BACLOFEN 10 MG PO TABS
5.0000 mg | ORAL_TABLET | Freq: Three times a day (TID) | ORAL | Status: DC
  Administered 2015-04-24 – 2015-04-27 (×10): 5 mg via ORAL
  Filled 2015-04-24 (×11): qty 1

## 2015-04-24 MED ORDER — ATORVASTATIN CALCIUM 10 MG PO TABS
10.0000 mg | ORAL_TABLET | Freq: Every day | ORAL | Status: DC
  Administered 2015-04-24: 10 mg via ORAL
  Filled 2015-04-24 (×2): qty 1

## 2015-04-24 MED ORDER — SODIUM CHLORIDE 0.9 % IV SOLN
Freq: Once | INTRAVENOUS | Status: AC
Start: 2015-04-24 — End: 2015-04-24
  Administered 2015-04-24: 01:00:00 via INTRAVENOUS

## 2015-04-24 MED ORDER — QUETIAPINE FUMARATE 300 MG PO TABS
300.0000 mg | ORAL_TABLET | Freq: Every day | ORAL | Status: DC
  Administered 2015-04-24 – 2015-04-26 (×3): 300 mg via ORAL
  Filled 2015-04-24 (×5): qty 1

## 2015-04-24 MED ORDER — CHOLECALCIFEROL 1.25 MG (50000 UT) PO CAPS
50000.0000 [IU] | ORAL_CAPSULE | ORAL | Status: DC
  Filled 2015-04-24: qty 1

## 2015-04-24 MED ORDER — SENNA 8.6 MG PO TABS
2.0000 | ORAL_TABLET | Freq: Every day | ORAL | Status: DC
  Administered 2015-04-24 – 2015-04-27 (×2): 17.2 mg via ORAL
  Filled 2015-04-24 (×4): qty 2

## 2015-04-24 MED ORDER — AMLODIPINE BESYLATE 5 MG PO TABS
5.0000 mg | ORAL_TABLET | Freq: Every day | ORAL | Status: DC
  Administered 2015-04-24 – 2015-04-25 (×2): 5 mg via ORAL
  Filled 2015-04-24 (×2): qty 1

## 2015-04-24 MED ORDER — METOPROLOL TARTRATE 25 MG PO TABS
25.0000 mg | ORAL_TABLET | Freq: Two times a day (BID) | ORAL | Status: DC
  Administered 2015-04-24 – 2015-04-27 (×6): 25 mg via ORAL
  Filled 2015-04-24 (×8): qty 1

## 2015-04-24 MED ORDER — DOCUSATE SODIUM 100 MG PO CAPS
100.0000 mg | ORAL_CAPSULE | Freq: Two times a day (BID) | ORAL | Status: DC
  Administered 2015-04-24 – 2015-04-26 (×3): 100 mg via ORAL
  Filled 2015-04-24 (×7): qty 1

## 2015-04-24 MED ORDER — PROMETHAZINE HCL 25 MG/ML IJ SOLN: 25.0000 mg | INTRAMUSCULAR | Status: DC | PRN

## 2015-04-24 MED ORDER — ALUM & MAG HYDROXIDE-SIMETH 200-200-20 MG/5ML PO SUSP: 20.0000 mL | Freq: Four times a day (QID) | ORAL | Status: DC | PRN

## 2015-04-24 MED ORDER — LAMOTRIGINE 25 MG PO TABS
200.0000 mg | ORAL_TABLET | Freq: Two times a day (BID) | ORAL | Status: DC
  Administered 2015-04-24 – 2015-04-26 (×5): 200 mg via ORAL
  Filled 2015-04-24 (×2): qty 8
  Filled 2015-04-24 (×5): qty 1

## 2015-04-24 MED ORDER — LIDOCAINE 5 % EX PTCH
2.0000 | MEDICATED_PATCH | Freq: Every day | CUTANEOUS | Status: DC | PRN
  Filled 2015-04-24: qty 2

## 2015-04-24 NOTE — Progress Notes (Signed)
Pt transferred from ED with RN on monitor. Pt HR elevated in 120's, lowered to 117 once settled. Pt other VSS. Healing ulcers to bottom, contact isolation for ESBL in urine. Chronic foley in place from facility with bloody drainage noted.
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
## 2015-04-24 NOTE — ED Notes (Signed)
Patient wants a doctor to see him because he is shaking and wants to know why MD aware

## 2015-04-24 NOTE — Progress Notes (Addendum)
Discussed with Dr. Sherral Hammers- INR >2 and Lovenox ordered for DVT prophylaxis. MD agreed to hold Lovenox until INR <2. Will discontinue order and place miscellaneous protocol with INR monitoring for pharmacist to re-order Lovenox when INR <2.   Sloan Leiter, PharmD, BCPS Clinical Pharmacist [phone] 04/24/2015, 1:34 PM

## 2015-04-24 NOTE — Progress Notes (Signed)
UR COMPLETED  

## 2015-04-25 DIAGNOSIS — Z7189 Other specified counseling: Secondary | ICD-10-CM

## 2015-04-25 DIAGNOSIS — Z515 Encounter for palliative care: Secondary | ICD-10-CM

## 2015-04-25 DIAGNOSIS — S31000D Unspecified open wound of lower back and pelvis without penetration into retroperitoneum, subsequent encounter: Secondary | ICD-10-CM

## 2015-04-25 DIAGNOSIS — N1330 Unspecified hydronephrosis: Secondary | ICD-10-CM

## 2015-04-25 DIAGNOSIS — J189 Pneumonia, unspecified organism: Secondary | ICD-10-CM

## 2015-04-25 DIAGNOSIS — A408 Other streptococcal sepsis: Secondary | ICD-10-CM

## 2015-04-25 LAB — PROTIME-INR
INR: 2.27 — ABNORMAL HIGH (ref 0.00–1.49)
Prothrombin Time: 24.8 seconds — ABNORMAL HIGH (ref 11.6–15.2)

## 2015-04-25 MED ORDER — POTASSIUM CHLORIDE CRYS ER 20 MEQ PO TBCR
40.0000 meq | EXTENDED_RELEASE_TABLET | Freq: Once | ORAL | Status: AC
  Administered 2015-04-25: 40 meq via ORAL
  Filled 2015-04-25: qty 2

## 2015-04-25 MED ORDER — OXYCODONE HCL 5 MG PO TABS
5.0000 mg | ORAL_TABLET | ORAL | Status: DC | PRN
  Administered 2015-04-25 – 2015-04-26 (×3): 10 mg via ORAL
  Filled 2015-04-25 (×3): qty 2

## 2015-04-25 MED ORDER — FENTANYL CITRATE (PF) 100 MCG/2ML IJ SOLN
25.0000 ug | INTRAMUSCULAR | Status: DC | PRN
  Administered 2015-04-25 – 2015-04-26 (×3): 25 ug via INTRAVENOUS
  Filled 2015-04-25 (×4): qty 2

## 2015-04-25 MED ORDER — ENSURE ENLIVE PO LIQD
237.0000 mL | Freq: Three times a day (TID) | ORAL | Status: DC
  Administered 2015-04-25 – 2015-04-27 (×3): 237 mL via ORAL

## 2015-04-25 MED ORDER — ONDANSETRON HCL 4 MG/2ML IJ SOLN
4.0000 mg | Freq: Four times a day (QID) | INTRAMUSCULAR | Status: DC | PRN
Start: 2015-04-25 — End: 2015-04-27
  Administered 2015-04-25 – 2015-04-27 (×4): 4 mg via INTRAVENOUS
  Filled 2015-04-25 (×5): qty 2

## 2015-04-25 MED ORDER — ACETAMINOPHEN 325 MG PO TABS: 650.0000 mg | ORAL_TABLET | Freq: Four times a day (QID) | ORAL | Status: DC | PRN

## 2015-04-25 MED ORDER — SODIUM CHLORIDE 0.9 % IV SOLN
500.0000 mg | Freq: Three times a day (TID) | INTRAVENOUS | Status: DC
  Administered 2015-04-25 – 2015-04-27 (×6): 500 mg via INTRAVENOUS
  Filled 2015-04-25 (×8): qty 500

## 2015-04-25 MED ORDER — MORPHINE SULFATE (PF) 2 MG/ML IV SOLN
2.0000 mg | INTRAVENOUS | Status: DC | PRN
  Administered 2015-04-25 (×2): 4 mg via INTRAVENOUS
  Filled 2015-04-25 (×2): qty 2
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
## 2015-04-25 NOTE — Progress Notes (Signed)
I met with Mr. Schertzer.  We discussed that, in light of multiple chronic medical problems that continue to worsen, his care should be focused on interventions that are likely to allow him to achieve goal being out of the hospital and spending time with family. We discussed heroic measures at the end of life, and he expressed that this is not in line with his desire for a natural death.  He requested I change CODE STATUS to DO NOT RESUSCITATE/DO NOT INTUBATE.  Full consult note to follow.  Plan for family meeting to further discuss goals of care tomorrow at 3:00 PM when his daughter [name] can be present.  Micheline Rough, MD [place] Team [phone]

## 2015-04-25 NOTE — Clinical Social Work Note (Signed)
Clinical Social Work Assessment  Patient Details  Name: Samuel [name] MRN: [id number] Date of Birth: May 20, 1956  Date of referral:  04/25/15               Reason for consult:  Facility Placement                Housing/Transportation Living arrangements for the past 2 months:  [place] of Information:  Patient Patient Interpreter Needed:  None Criminal Activity/Legal Involvement Pertinent to Current Situation/Hospitalization:  No - Comment as needed Significant Relationships:  Adult Children Lives with:  Self Do you feel safe going back to the place where you live?  No Need for family participation in patient care:  Yes (Comment)  Care giving concerns:  N/A   Facilities manager / plan:  CSW met with the pt at the bedside. CSW introduced self and purpose of the visit. CSW discussed SNF rehab. Pt confirmed that he has been to two SNF in the past. Pt confirmed that he was receiving rehab at [facility] prior to being admitted. CSW explained the SNF process. CSW explained insurance and its relation to SNF placement. CSW answered all questions in which the pt inquired about. CSW will continue to follow this pt and assist with discharge as needed.   Employment status:  Disabled (Comment on whether or not currently receiving Disability) Insurance information:  Medicaid [place], [employer] PT Recommendations:  Not assessed at this time Information / Referral to community resources:   (None )  Patient/Family's Response to care:  Pt shared the care has been well.   Patient/Family's Understanding of and Emotional Response to Diagnosis, Current Treatment, and Prognosis: Pt acknowledged some of his medical condition. Pt did not go into great details.   Emotional Assessment Appearance:  Appears stated age Attitude/Demeanor/Rapport:   (Appropriate ) Affect (typically observed):  Appropriate Orientation:  Oriented to Self, Oriented to Place,  Oriented to  Time Alcohol / Substance use:  Not Applicable Psych involvement (Current and /or in the community):  No (Comment)  Discharge Needs  Concerns to be addressed:  Denies Needs/Concerns at this time Readmission within the last 30 days:  Yes Current discharge risk:  None Barriers to Discharge:  No Barriers Identified   Ovadia Lopp, LCSW 04/25/2015, 10:33 AM

## 2015-04-25 NOTE — Progress Notes (Signed)
   04/25/15 1000  Clinical Encounter Type  Visited With Patient  Visit Type Initial;Psychological support;Spiritual support;Social support  Referral From Patient  Recommendations Palliative Care  Spiritual Encounters  Spiritual Needs Prayer;Emotional  Stress Factors  Patient Stress Factors Major life changes;Family relationships  [facility] responding to pt request for prayer; [place] spent time talking with pt who desires quality of life and is in spiritual healing; [place] discussed palliative care visit and pt seemed open to discussing care plan with team. 10:40 AM Gwynn Burly
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
## 2015-04-25 NOTE — Clinical Social Work Note (Signed)
CSW received a call from the pt's daughter [name]. [name] informed the CSW that the pt and the family would like to try placing the pt at a different SNF. [name] acknowledged that the pt would be different to place given his past history. [name] requested the CSW to send the pt's clinicals to [facility] and [facility].    [place], MSW, [place]

## 2015-04-26 DIAGNOSIS — A419 Sepsis, unspecified organism: Principal | ICD-10-CM

## 2015-04-26 DIAGNOSIS — C688 Malignant neoplasm of overlapping sites of urinary organs: Secondary | ICD-10-CM

## 2015-04-26 DIAGNOSIS — J690 Pneumonitis due to inhalation of food and vomit: Secondary | ICD-10-CM

## 2015-04-26 DIAGNOSIS — I10 Essential (primary) hypertension: Secondary | ICD-10-CM

## 2015-04-26 LAB — MAGNESIUM: MAGNESIUM: 1.3 mg/dL — AB (ref 1.7–2.4)

## 2015-04-26 LAB — COMPREHENSIVE METABOLIC PANEL
ALT: 9 U/L — ABNORMAL LOW (ref 17–63)
ANION GAP: 8 (ref 5–15)
AST: 19 U/L (ref 15–41)
Albumin: 1.8 g/dL — ABNORMAL LOW (ref 3.5–5.0)
Alkaline Phosphatase: 82 U/L (ref 38–126)
BILIRUBIN TOTAL: 0.6 mg/dL (ref 0.3–1.2)
BUN: 7 mg/dL (ref 6–20)
CHLORIDE: 101 mmol/L (ref 101–111)
CO2: 25 mmol/L (ref 22–32)
Calcium: 7.8 mg/dL — ABNORMAL LOW (ref 8.9–10.3)
Creatinine, Ser: 1.17 mg/dL (ref 0.61–1.24)
Glucose, Bld: 92 mg/dL (ref 65–99)
POTASSIUM: 3.4 mmol/L — AB (ref 3.5–5.1)
Sodium: 134 mmol/L — ABNORMAL LOW (ref 135–145)
TOTAL PROTEIN: 6.6 g/dL (ref 6.5–8.1)

## 2015-04-26 LAB — CBC
HEMATOCRIT: 24.5 % — AB (ref 39.0–52.0)
HEMOGLOBIN: 7.7 g/dL — AB (ref 13.0–17.0)
MCH: 27.5 pg (ref 26.0–34.0)
MCHC: 31.4 g/dL (ref 30.0–36.0)
MCV: 87.5 fL (ref 78.0–100.0)
Platelets: 281 10*3/uL (ref 150–400)
RBC: 2.8 MIL/uL — ABNORMAL LOW (ref 4.22–5.81)
RDW: 16.4 % — ABNORMAL HIGH (ref 11.5–15.5)
WBC: 5.6 10*3/uL (ref 4.0–10.5)

## 2015-04-26 LAB — PROTIME-INR
INR: 1.83 — ABNORMAL HIGH (ref 0.00–1.49)
PROTHROMBIN TIME: 21.1 s — AB (ref 11.6–15.2)

## 2015-04-26 LAB — PROCALCITONIN: Procalcitonin: 0.44 ng/mL

## 2015-04-26 MED ORDER — FENTANYL 25 MCG/HR TD PT72
25.0000 ug | MEDICATED_PATCH | TRANSDERMAL | Status: DC
  Administered 2015-04-26: 25 ug via TRANSDERMAL
  Filled 2015-04-26: qty 1

## 2015-04-26 MED ORDER — MORPHINE SULFATE (PF) 2 MG/ML IV SOLN
2.0000 mg | Freq: Once | INTRAVENOUS | Status: AC
  Administered 2015-04-26: 2 mg via INTRAVENOUS
  Filled 2015-04-26: qty 1

## 2015-04-26 MED ORDER — SODIUM CHLORIDE 0.9 % IJ SOLN
10.0000 mL | INTRAMUSCULAR | Status: DC | PRN
  Administered 2015-04-27 (×2): 10 mL
  Filled 2015-04-26 (×2): qty 40

## 2015-04-26 MED ORDER — MORPHINE SULFATE (PF) 2 MG/ML IV SOLN
2.0000 mg | INTRAVENOUS | Status: DC | PRN
  Administered 2015-04-26 – 2015-04-27 (×6): 4 mg via INTRAVENOUS
  Filled 2015-04-26 (×5): qty 2
  Filled 2015-04-26: qty 1
  Filled 2015-04-26 (×3): qty 2

## 2015-04-26 NOTE — Progress Notes (Addendum)
Daily Progress Note   Patient Name: Samuel [name]       Date: 04/26/2015 DOB: [birth year]  Age: 59 y.o. MRN#: [id number] Attending Physician: Samuel Patricia, MD Primary Care Physician: Samuel Brightly, MD Admit Date: 04/23/2015  Reason for Consultation/Follow-up: Establishing goals of care  Subjective: 59 year old male with transitional cell carcinoma admitted from a skilled nursing facility. He has had frequent hospitalizations related to sequela of his disease including management of acute renal failure. He has undergone bilateral nephrostomy tubes by IR.   He was encephalopathic his last admission and therefore could not have meaningful goals of care discussion with patient at that time. He re-presented from nursing home where he had been for a very short period of time with intractable nausea and vomiting. He was found to have a chest x-ray with a consolidation right upper lobe consistent with pneumonia. Palliative was consulted to discuss goals of care.  Interval Events: I met today with Samuel [name] and his daughter Samuel [name]. We discussed his clinical Course to this point in time as well as his goals moving forward.  He reports the most important things to him are being comfortable, spending time with his family, and being out of the hospital. He was very unhappy with the last facility where he was placed, however he feels that a different facility would be a good option to work toward.   We discussed that based upon his goals, he would be well served by plan to transition to SNF with hospice support. This would allow him to continue to focus on being comfortable and spending time with family while allowing him to have his symptoms well managed outside the hospital.  We discussed that the hospital can be useful as long as he is getting well enough from care he receives at the hospital to enjoy his time outside the hospital, but we have reached the point where, if his goal is to  that of the hospital, he will likely be better served to plan on bringing care to him rather repeated trips to the hospital. We discussed hospice as a tool that may be beneficial in this goal as he is reaching a point where we are trying to fix problems that are not fixable.  - Patient was very receptive to plan to pursue hospice on discharge. He would like to continue treatment of his current infection this hospitalization. At discharge, he is in agreement with pursuing placement at a outside facility with hospice support. He reports understanding this to be a change in his care with a focus on being comfortable rather than continuing to pursue treatment for recurrent infections and other sequelae of his underlying disease. - We will continue to work to focus on his comfort as he reports this is the most important thing to him.  Length of Stay: 2 days  Current Medications: Scheduled Meds:  . baclofen  5 mg Oral TID  . Cholecalciferol  50,000 Units Oral Q30 days  . clonazePAM  0.25 mg Oral BID  . docusate sodium  100 mg Oral BID  . feeding supplement (ENSURE ENLIVE)  237 mL Oral TID BM  . imipenem-cilastatin  500 mg Intravenous Q8H  . [START ON 04/27/2015] Influenza vac split quadrivalent PF  0.5 mL Intramuscular Tomorrow-1000  . lactose free nutrition  237 mL Oral BID BM  . lactulose  20 g Oral QID  . lamoTRIgine  200 mg Oral BID  . metoprolol tartrate  25 mg Oral BID  . oxybutynin  5 mg Oral q morning - 10a  . pantoprazole  40 mg Oral Daily  . polyvinyl alcohol  2 drop Both Eyes TID  . pregabalin  25 mg Oral TID  . QUEtiapine  300 mg Oral QHS  . senna  2 tablet Oral Daily  . silver sulfADIAZINE   Topical BID  . sodium chloride  3 mL Intravenous Q12H  . traZODone  50 mg Oral QHS    Continuous Infusions:    PRN Meds: acetaminophen, alum & mag hydroxide-simeth, guaiFENesin, lidocaine, morphine injection, nitroGLYCERIN, ondansetron (ZOFRAN) IV, oxyCODONE, sodium chloride  Palliative  Performance Scale: 40     Vital Signs: BP 129/82 mmHg  Pulse 87  Temp(Src) 98.7 F (37.1 C) (Oral)  Resp 16  Ht 5' 4"  (1.626 m)  Wt 93.2 kg (205 lb 7.5 oz)  BMI 35.25 kg/m2  SpO2 96% SpO2: SpO2: 96 % O2 Device: O2 Device: Not Delivered O2 Flow Rate: O2 Flow Rate (L/min): 2 L/min  Intake/output summary:  Intake/Output Summary (Last 24 hours) at 04/26/15 1825 Last data filed at 04/26/15 1758  Gross per 24 hour  Intake    960 ml  Output   4675 ml  Net  -3715 ml   LBM:   Baseline Weight: Weight: 99.791 kg (220 lb) Most recent weight: Weight: 93.2 kg (205 lb 7.5 oz)  Physical Exam: General appearance: Awake, alert, in no acute distress. Sitting in bed. Pleasant and conversational, however he is easily distracted. Resp: Diminished. Scattered crackles. No rhonchi.  Cardio: regular rate and rhythm, S1, S2 normal, no murmur, click, rub or gallop GI: soft, non-tender; bowel sounds normal; no masses, no organomegaly Neurologic: He is a known paraplegic. Involuntary movement of his upper extremities noted occasionally. Psych: Very tangential in thought at times and very poor historian overall. He is able to verbalize back plan to transition to SNF with hospice support and intent of this would be to focus on comfort rather continue treatment of his current medical problems   Additional Data Reviewed: Recent Labs     04/24/15  0315  04/26/15  0509  WBC  8.6  5.6  HGB  8.7*  7.7*  PLT  244  281  NA  131*  134*  BUN  9  7  CREATININE  1.55*  1.17     Problem List:  Patient Active Problem List   Diagnosis Date Noted  . Acute respiratory failure with hypoxia   . Aspiration pneumonia   . Infection due to ESBL-producing Klebsiella pneumoniae   . Obstructive uropathy   . Hyponatremia 04/04/2015  . Acute renal failure syndrome   . Seizure   . Chronic anticoagulation   . Essential hypertension   . ARF (acute renal failure) 04/03/2015  . Normochromic normocytic  anemia   . Sacral decubitus ulcer   . Palliative care encounter   . UTI (urinary tract infection) due to urinary indwelling Foley catheter 03/23/2015  . Acute kidney injury 02/22/2015  . VRE (vancomycin-resistant Enterococci) infection 02/22/2015  . Hypomagnesemia 02/22/2015  . Pressure ulcer 02/19/2015  . Anemia 01/10/2015  . Urothelial cancer 10/07/2014  . Renal insufficiency   . Altered mental status 10/03/2014  . Hydronephrosis   . Seizures 09/26/2014  . Acute encephalopathy   . Hematuria 09/17/2014  . Somnolence 07/02/2014  . Dyspnea   . Pneumonia 06/29/2014  . Cough 01/16/2014  . HCAP (healthcare-associated pneumonia) 12/08/2013  . DVT of lower extremity (deep venous thrombosis) 09/21/2013  . Sacral wound 09/21/2013  .  Acute pulmonary embolism 09/19/2013  . Pulmonary emboli 09/19/2013  . Acute blood loss anemia 09/01/2013  . Hip fracture 08/29/2013  . Sepsis 02/24/2013  . Encephalopathy acute 02/24/2013  . Coagulopathy 02/24/2013  . HTN (hypertension) 02/24/2013  . Bipolar disorder 12/03/2012  . Hepatitis C 12/03/2012  . Insomnia 12/03/2012  . Hypokalemia 12/03/2012  . Chronic pain 12/03/2012  . Dyslipidemia 12/03/2012     Palliative Care Assessment & Plan    Code Status:  DNR  Goals of Care:  Patient would like to continue with treatment of his infection his hospitalization  He stated that his goals are to be comfortable, spend time with family, and stay out of the hospital  We discussed options for this, and he would like to pursue discharge to SNF with hospice support following this admission   Symptom Management:  Pain: He reports his pain continues to be uncontrolled. He states a 7 out of 10 at this time. He does get some relief from morphine he has been receiving intermittently IV. His daughter reports that he has had the best success an outpatient with the Duragesic patch. I wonder if there is a component of chemical coping to his pain. If so,  long-acting agent such as fentanyl patch may be preferable to continue use of short acting agents. Based upon his opioid usage of the past 24 hours, will plan for starting low-dose patch and continue with morphine IV as needed for breakthrough pain. He's used a total of 81 oral morphine equivalents through IV morphine and oral oxycodone in addition to 2 doses of 25 g each of fentanyl. We'll therefore plan to start on fentanyl 25 g an hour. We'll continue to titrate as needed to work to ensure his comfort as he reports is the most important thing to him. He is planning continuing treatment for her current infection but will be discharging from the hospital with hospice support.   Palliative Prophylaxis: Continue senna and lactulose. Discussed the need to continue these medications especially since will be increasing his narcotics  Psycho-social/Spiritual:  Desire for further Chaplaincy support:yes   Prognosis: < 6 months Discharge Planning: [place] with Hospice   Care plan was discussed with patient, his daughter, attending hospitalist, and case manager  Thank you for allowing the Palliative Medicine Team to assist in the care of this patient.   Time In: 1500 Time Out: 1600 Total Time 60 Prolonged Time Billed no    Greater than 50%  of this time was spent counseling and coordinating care related to the above assessment and plan.   Micheline Rough, MD  04/26/2015, 6:25 PM  Please contact Palliative Medicine Team phone at [phone] for questions and concerns.

## 2015-04-26 NOTE — Progress Notes (Signed)
Patient complaining of pain and asking for PRN morphine stating it worked better for him than current PRN medications ordered. Dr. Waldron Labs made aware and verbal order received for morphine IV 2-4mg  Q4 for severe pain.

## 2015-04-26 NOTE — Care Management Note (Addendum)
Case Management Note  Patient Details  Name: Samuel [name] MRN: [id number] Date of Birth: 04-Aug-1955  Subjective/Objective:                  Date: 04-26-15 Thursday Spoke with palliative care MD about disposition. MD states that patient and family agreeable to SNF with Hospice Care, although SNF placement may be difficult due to patient/ family being unsatisfied with previous facilities. Verified patient admitted from [facility]. Verified with MD patient anticipates to go to SNF with hospice care at time of discharge.   [facility] aware of patient and family preference.   Plan: CM will continue to follow for discharge planning and [facility] resources.   Carles Collet RN BSN CM [phone]   Action/Plan:  PAtient agreed to SNF with Palliative care, patient initially called [facility] to appeal discharge, but then after talking with MD agreed to discharge today. CM observed patient call [facility] to cancel appeal  Expected Discharge Date:                  Expected Discharge Plan:  [place]  In-House Referral:  Clinical Social Work (Pt from [facility])  Discharge planning Services  CM Consult  Post Acute Care Choice:    Choice offered to:     DME Arranged:    DME Agency:     HH Arranged:    [place] Agency:     Status of Service:  In process, will continue to follow  Medicare Important Message Given:    Date Medicare IM Given:    Medicare IM give by:    Date Additional Medicare IM Given:    Additional Medicare Important Message give by:     If discussed at [place] of Stay Meetings, dates discussed:    Additional Comments:  Carles Collet, RN 04/26/2015, 4:03 PM
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
## 2015-04-27 DIAGNOSIS — G893 Neoplasm related pain (acute) (chronic): Secondary | ICD-10-CM

## 2015-04-27 DIAGNOSIS — G9340 Encephalopathy, unspecified: Secondary | ICD-10-CM

## 2015-04-27 LAB — CBC
HEMATOCRIT: 25 % — AB (ref 39.0–52.0)
HEMOGLOBIN: 7.9 g/dL — AB (ref 13.0–17.0)
MCH: 28 pg (ref 26.0–34.0)
MCHC: 31.6 g/dL (ref 30.0–36.0)
MCV: 88.7 fL (ref 78.0–100.0)
Platelets: 300 10*3/uL (ref 150–400)
RBC: 2.82 MIL/uL — AB (ref 4.22–5.81)
RDW: 16.3 % — AB (ref 11.5–15.5)
WBC: 5.9 10*3/uL (ref 4.0–10.5)

## 2015-04-27 LAB — BASIC METABOLIC PANEL
ANION GAP: 8 (ref 5–15)
BUN: 8 mg/dL (ref 6–20)
CALCIUM: 8 mg/dL — AB (ref 8.9–10.3)
CO2: 25 mmol/L (ref 22–32)
Chloride: 101 mmol/L (ref 101–111)
Creatinine, Ser: 1.15 mg/dL (ref 0.61–1.24)
GFR calc non Af Amer: >60 mL/min (ref >60)
GLUCOSE: 103 mg/dL — AB (ref 65–99)
POTASSIUM: 3.7 mmol/L (ref 3.5–5.1)
Sodium: 134 mmol/L — ABNORMAL LOW (ref 135–145)

## 2015-04-27 LAB — URINE CULTURE: CULTURE: NO GROWTH

## 2015-04-27 LAB — PROTIME-INR
INR: 1.54 — AB (ref 0.00–1.49)
Prothrombin Time: 18.5 seconds — ABNORMAL HIGH (ref 11.6–15.2)

## 2015-04-27 MED ORDER — LAMOTRIGINE 100 MG PO TABS
200.0000 mg | ORAL_TABLET | Freq: Two times a day (BID) | ORAL | Status: DC
  Filled 2015-04-27 (×2): qty 2

## 2015-04-27 MED ORDER — HEPARIN SOD (PORK) LOCK FLUSH 100 UNIT/ML IV SOLN
500.0000 [IU] | INTRAVENOUS | Status: AC | PRN
  Administered 2015-04-27: 500 [IU]

## 2015-04-27 MED ORDER — AMOXICILLIN-POT CLAVULANATE 875-125 MG PO TABS: 1.0000 | ORAL_TABLET | Freq: Two times a day (BID) | ORAL | Status: DC

## 2015-04-27 MED ORDER — ENSURE ENLIVE PO LIQD: 237.0000 mL | Freq: Three times a day (TID) | ORAL | Status: DC

## 2015-04-27 MED ORDER — HYDROMORPHONE HCL 2 MG PO TABS: 2.0000 mg | ORAL_TABLET | ORAL | Status: DC | PRN

## 2015-04-27 MED ORDER — FENTANYL 25 MCG/HR TD PT72: 25.0000 ug | MEDICATED_PATCH | TRANSDERMAL | Status: DC

## 2015-04-27 NOTE — Discharge Instructions (Signed)
Follow with Primary MD [name], MD in 7 days   Get CBC, CMP, during his visit   Activity: As tolerated with Full fall precautions use walker/cane & assistance as needed   Disposition SNF   Diet: Regular , with feeding assistance and aspiration precautions.  For Heart failure patients - Check your Weight same time everyday, if you gain over 2 pounds, or you develop in leg swelling, experience more shortness of breath or chest pain, call your Primary MD immediately. Follow Cardiac Low Salt Diet and 1.5 lit/day fluid restriction.   On your next visit with your primary care physician please Get Medicines reviewed and adjusted.   Please request your Prim.MD to go over all Hospital Tests and Procedure/Radiological results at the follow up, please get all Hospital records sent to your Prim MD by signing hospital release before you go home.   If you experience worsening of your admission symptoms, develop shortness of breath, life threatening emergency, suicidal or homicidal thoughts you must seek medical attention immediately by calling 911 or calling your MD immediately  if symptoms less severe.  You Must read complete instructions/literature along with all the possible adverse reactions/side effects for all the Medicines you take and that have been prescribed to you. Take any new Medicines after you have completely understood and accpet all the possible adverse reactions/side effects.   Do not drive, operating heavy machinery, perform activities at heights, swimming or participation in water activities or provide baby sitting services if your were admitted for syncope or siezures until you have seen by Primary MD or a Neurologist and advised to do so again.  Do not drive when taking Pain medications.    Do not take more than prescribed Pain, Sleep and Anxiety Medications  Special Instructions: If you have smoked or chewed Tobacco  in the last 2 yrs please stop smoking, stop  any regular Alcohol  and or any Recreational drug use.  Wear Seat belts while driving.   Please note  You were cared for by a hospitalist during your hospital stay. If you have any questions about your discharge medications or the care you received while you were in the hospital after you are discharged, you can call the unit and asked to speak with the hospitalist on call if the hospitalist that took care of you is not available. Once you are discharged, your primary care physician will handle any further medical issues. Please note that NO REFILLS for any discharge medications will be authorized once you are discharged, as it is imperative that you return to your primary care physician (or establish a relationship with a primary care physician if you do not have one) for your aftercare needs so that they can reassess your need for medications and monitor your lab values.

## 2015-04-27 NOTE — Progress Notes (Signed)
Daily Progress Note   Patient Name: Samuel [name]       Date: 04/27/2015 DOB: January 03, 1956  Age: 59 y.o. MRN#: [id number] Attending Physician: Albertine Patricia, MD Primary Care Physician: [name], MD Admit Date: 04/23/2015  Reason for Consultation/Follow-up: Establishing goals of care, Non pain symptom management and Pain control  Subjective: 58 year old male with transitional cell carcinoma admitted from a skilled nursing facility. He has had frequent hospitalizations related to sequela of his disease including management of acute renal failure. He has undergone bilateral nephrostomy tubes by IR.   He was encephalopathic his last admission and therefore could not have meaningful goals of care discussion with patient at that time. He re-presented from nursing home where he had been for a very short period of time with intractable nausea and vomiting. He was found to have a chest x-ray with a consolidation right upper lobe consistent with pneumonia. Palliative was consulted to discuss goals of care.  Interval Events: I met with Mr. Thackston this morning. He confirmed understanding conversation yesterday and remains agreeable to plan moving forward for placement at nursing facility with hospice support.  He reports he continues to have intermittent pain. He states he is not sure if the fentanyl patches been helpful, but has been on for less than 24 hours. He did receive one dose of morphine last evening around midnight. Reports that he slept well after this.  Length of Stay: 3 days  Current Medications: Scheduled Meds:  . baclofen  5 mg Oral TID  . Cholecalciferol  50,000 Units Oral Q30 days  . clonazePAM  0.25 mg Oral BID  . docusate sodium  100 mg Oral BID  . feeding supplement (ENSURE ENLIVE)  237 mL Oral TID BM  . fentaNYL  25 mcg Transdermal Q72H  . imipenem-cilastatin  500 mg Intravenous Q8H  . Influenza vac split quadrivalent PF  0.5 mL Intramuscular Tomorrow-1000    . lactose free nutrition  237 mL Oral BID BM  . lactulose  20 g Oral QID  . lamoTRIgine  200 mg Oral BID  . metoprolol tartrate  25 mg Oral BID  . oxybutynin  5 mg Oral q morning - 10a  . pantoprazole  40 mg Oral Daily  . polyvinyl alcohol  2 drop Both Eyes TID  . pregabalin  25 mg Oral TID  . QUEtiapine  300 mg Oral QHS  . senna  2 tablet Oral Daily  . silver sulfADIAZINE   Topical BID  . sodium chloride  3 mL Intravenous Q12H  . traZODone  50 mg Oral QHS    Continuous Infusions:    PRN Meds: acetaminophen, alum & mag hydroxide-simeth, guaiFENesin, lidocaine, morphine injection, nitroGLYCERIN, ondansetron (ZOFRAN) IV, sodium chloride  Palliative Performance Scale:40%     Vital Signs: BP 101/66 mmHg  Pulse 103  Temp(Src) 98.5 F (36.9 C) (Oral)  Resp 18  Ht 5' 4"  (1.626 m)  Wt 93.2 kg (205 lb 7.5 oz)  BMI 35.25 kg/m2  SpO2 91% SpO2: SpO2: 91 % O2 Device: O2 Device: Not Delivered O2 Flow Rate: O2 Flow Rate (L/min): 2 L/min  Intake/output summary:  Intake/Output Summary (Last 24 hours) at 04/27/15 1330 Last data filed at 04/27/15 0928  Gross per 24 hour  Intake    820 ml  Output   3900 ml  Net  -3080 ml   LBM:   Baseline Weight: Weight: 99.791 kg (220 lb) Most recent weight: Weight: 93.2 kg (205 lb 7.5 oz)  Physical Exam:  General appearance: Awake, alert, in no acute distress. Sitting in bed. Pleasant and conversational, however he is easily distracted. Resp: Diminished. Scattered crackles. No rhonchi.  Cardio: regular rate and rhythm, S1, S2 normal, no murmur, click, rub or gallop GI: soft, non-tender; bowel sounds normal; no masses, no organomegaly Neurologic: He is a known paraplegic. Involuntary movement of his upper extremities noted occasionally. Psych: Very tangential in thought at times and very poor historian overall. He is able to verbalize back plan to transition to SNF with hospice support and intent of this would be to focus on comfort rather  continue treatment of his current medical problems             Additional Data Reviewed: Recent Labs     04/26/15  0509  04/27/15  0426  WBC  5.6  5.9  HGB  7.7*  7.9*  PLT  281  300  NA  134*  134*  BUN  7  8  CREATININE  1.17  1.15     Problem List:  Patient Active Problem List   Diagnosis Date Noted  . Acute respiratory failure with hypoxia   . Aspiration pneumonia   . Infection due to ESBL-producing Klebsiella pneumoniae   . Obstructive uropathy   . Hyponatremia 04/04/2015  . Acute renal failure syndrome   . Seizure   . Chronic anticoagulation   . Essential hypertension   . ARF (acute renal failure) 04/03/2015  . Normochromic normocytic anemia   . Sacral decubitus ulcer   . Palliative care encounter   . UTI (urinary tract infection) due to urinary indwelling Foley catheter 03/23/2015  . Acute kidney injury 02/22/2015  . VRE (vancomycin-resistant Enterococci) infection 02/22/2015  . Hypomagnesemia 02/22/2015  . Pressure ulcer 02/19/2015  . Anemia 01/10/2015  . Urothelial cancer 10/07/2014  . Renal insufficiency   . Altered mental status 10/03/2014  . Hydronephrosis   . Seizures 09/26/2014  . Acute encephalopathy   . Hematuria 09/17/2014  . Somnolence 07/02/2014  . Dyspnea   . Pneumonia 06/29/2014  . Cough 01/16/2014  . HCAP (healthcare-associated pneumonia) 12/08/2013  . DVT of lower extremity (deep venous thrombosis) 09/21/2013  . Sacral wound 09/21/2013  . Acute pulmonary embolism 09/19/2013  . Pulmonary emboli 09/19/2013  . Acute blood loss anemia 09/01/2013  . Hip fracture 08/29/2013  . Sepsis 02/24/2013  . Encephalopathy acute 02/24/2013  . Coagulopathy 02/24/2013  . HTN (hypertension) 02/24/2013  . Bipolar disorder 12/03/2012  . Hepatitis C 12/03/2012  . Insomnia 12/03/2012  . Hypokalemia 12/03/2012  . Chronic pain 12/03/2012  . Dyslipidemia 12/03/2012     Palliative Care Assessment & Plan    Code Status:  DNR  Goals of  Care: Patient has stated his goals are to be comfortable, spend time with family, and stay out of the hospital. We discussed hospice as a tool to facilitate this and he is agreeable to placement in long-term facility with hospice support.   Symptom Management: Pain: Reports his pain was fairly well controlled overnight. He is unsure if the fentanyl patch has been helpful. I explained that he has had an on for little over 12 hours and this with a minimal amount of time I would expect until it starts to become effective.  He has used approximately 4 doses of rescue IV medication the last 24 hours.  While he will likely need to continue to titrate up on his fentanyl patch, I would hold on this at this time until his determine exactly what  his needs are. He reports that the IV morphine he has been receiving has been helpful to him. Will continue this for now, but if he is able to be discharged would transition this over to equianalgesic dose of Dilaudid as he reports Dilaudid has worked better for him than oxycodone. Would recommend discharge on Dilaudid 4 mg by mouth every 3 hours when necessary for pain. After his needs are determined more clearly, his long acting fentanyl patch can be titrated up as an outpatient by hospice service. Palliative Prophylaxis: Continue senna and lactulose. Discussed the need to continue these medications especially since will be increasing his narcotics   Psycho-social/Spiritual:  Desire for further Chaplaincy support:yes   Prognosis: < 6 months Discharge Planning: [facility]   Care plan was discussed with patient and Dr. Waldron Labs  Thank you for allowing the Palliative Medicine Team to assist in the care of this patient.   Time In: 1000 Time Out: 1020 Total Time 20 Prolonged Time Billed  no     Greater than 50%  of this time was spent counseling and coordinating care related to the above assessment and plan.   Micheline Rough, MD    04/27/2015, 1:30 PM  Please contact Palliative Medicine Team phone at [phone] for questions and concerns.

## 2015-04-27 NOTE — Care Management Important Message (Signed)
Important Message  Patient Details  Name: Samuel [name] MRN: [id number] Date of Birth: Aug 02, 1955   Medicare Important Message Given:  Yes-second notification given    Carles Collet, RN 04/27/2015, 11:13 AMImportant Message  Patient Details  Name: Samuel [name] MRN: [id number] Date of Birth: June 23, 1956   Medicare Important Message Given:  Yes-second notification given    Carles Collet, RN 04/27/2015, 11:13 AM

## 2015-04-27 NOTE — Clinical Social Work Placement (Signed)
   CLINICAL SOCIAL WORK PLACEMENT  NOTE  Date:  04/27/2015  Patient Details  Name: Samuel [name] MRN: [id number] Date of Birth: 1955/08/21  Clinical Social Work is seeking post-discharge placement for this patient at the [place] level of care (*CSW will initial, date and re-position this form in  chart as items are completed):  Yes   Patient/family provided with [facility] Work Department's list of facilities offering this level of care within the geographic area requested by the patient (or if unable, by the patient's family).  Yes   Patient/family informed of their freedom to choose among providers that offer the needed level of care, that participate in Medicare, Medicaid or managed care program needed by the patient, have an available bed and are willing to accept the patient.  Yes   Patient/family informed of [facility]'s ownership interest in [facility] and [facility], as well as of the fact that they are under no obligation to receive care at these facilities.  PASRR submitted to EDS on       PASRR number received on       Existing PASRR number confirmed on 04/27/15     FL2 transmitted to all facilities in geographic area requested by pt/family on 04/27/15     FL2 transmitted to all facilities within larger geographic area on       Patient informed that his/her managed care company has contracts with or will negotiate with certain facilities, including the following:        Yes   Patient/family informed of bed offers received.  Patient chooses bed at [facility] recommends and patient chooses bed at      Patient to be transferred to [facility] on 04/27/15.  Patient to be transferred to facility by Ambulance     Patient family notified on 04/27/15 of transfer.  Name of family member notified:  [name]     PHYSICIAN Please prepare priority discharge summary, including  medications, Please prepare prescriptions, Please sign FL2, Please sign DNR     Additional Comment:   Per MD patient ready for DC to [facility]. RN, patient, patient's family, and facility notified of DC. RN given number for report. DC packet on chart. Ambulance transport requested for patient. CSW signing off.  _______________________________________________ Liz Beach MSW, [place], [place], [phone]

## 2015-04-27 NOTE — Plan of Care (Signed)
Problem: Phase II Progression Outcomes Goal: Obtain order to discontinue catheter if appropriate Outcome: Not Met (add Reason) Chronic foley  Problem: Phase III Progression Outcomes Goal: Voiding independently Outcome: Not Met (add Reason) Chronic foley Goal: Foley discontinued Outcome: Not Met (add Reason) Chronic foley

## 2015-04-27 NOTE — Progress Notes (Signed)
Pt prepared for d/c to SNF. IV d/c'd. Skin intact except as charted in most recent assessments. Vitals are stable. Report called to receiving facility. Pt to be transported by ambulance service. 

## 2015-04-27 NOTE — Discharge Summary (Signed)
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 opacity, concerning for pneumonia. Followup PA and lateral chest X-ray is recommended in 3-4 weeks following trial of antibiotic therapy to ensure resolution and exclude underlying malignancy.   Electronically Signed   By: Jeb Levering M.D.   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 He has received up sizing of ureteral stents with his primary urology surgeon which has not relieved the obstruction.  He has been referred for bilateral percutaneous nephrostomy.  The patient is not responsive, and is attempting to removed himself from the fluoroscopy table upon initiation of the first attempt for percutaneous placement with  moderate [place]. Anesthesia team was consulted for assistance with general endotracheal tube anesthesia and need for paralysis.  EXAM: IR NEPHROSTOMY PLACEMENT LEFT; IR NEPHROSTOMY PLACEMENT RIGHT  COMPARISON:  Ultrasound 03/23/2015, CT 02/18/2015  ANESTHESIA/SEDATION: Anesthesia team was present for general endotracheal tube anesthesia.  CONTRAST:  24mL OMNIPAQUE IOHEXOL 300 MG/ML  SOLN  MEDICATIONS: 400 mg IV Cipro  FLUOROSCOPY TIME:  1 minutes 12 seconds  TECHNIQUE: The procedure, risks, benefits, and alternatives were explained to the patient's family. Questions regarding the procedure were encouraged and answered. The patient understands and consents to the procedure.  The patient was induced with anesthesia with the anesthesia team present. He was then placed in prone position on the fluoroscopy table.  The bilateral flank was prepped with chlorhexidine in a sterile fashion, and a sterile drape was applied covering the operative field. A sterile gown and sterile gloves were used for the procedure. Local anesthesia was provided with 1% Lidocaine.  Left:  The left flank was interrogated with ultrasound and the left kidney identified. The kidney is hydronephrotic. A suitable access site on the skin overlying the lower pole, posterior calix was identified. After local mg anesthesia was achieved, a small skin nick was made with an 11 blade scalpel. A 21 gauge Accustick needle was then advanced under direct sonographic guidance into the lower pole of the left inferior kidney. A 0.018 inch wire was advanced under fluoroscopic guidance into the left renal collecting system. The Accustick sheath was then advanced over the wire and a 0.018 system exchanged for a 0.035 system. Gentle hand injection of contrast material confirms placement of the sheath within the renal collecting system. A 10-French Cook all-purpose drain was then placed and positioned under fluoroscopic guidance. The locking loop is well formed within the  left renal pelvis. The catheter  was secured to the skin with 2-0 Prolene and a sterile bandage was placed. Catheter was left to gravity bag drainage.  A sample was sent for analysis.  Right:  The right flank was interrogated with ultrasound and the left kidney identified. The kidney is hydronephrotic. A suitable access site on the skin overlying the lower pole, posterior calix was identified. After local mg anesthesia was achieved, a small skin nick was made with an 11 blade scalpel. A 21 gauge Accustick needle was then advanced under direct sonographic guidance into the lower pole of the right inferior kidney. A 0.018 inch wire was advanced under fluoroscopic guidance into the left renal collecting system. The Accustick sheath was then advanced over the wire and a 0.018 system exchanged for a 0.035 system. Gentle hand injection of contrast material confirms placement of the sheath within the renal collecting system. A 10-French Cook all-purpose drain was then placed and positioned under fluoroscopic guidance. The locking loop is well formed within the left renal pelvis. The catheter was secured to the skin with 2-0 Prolene and a sterile bandage was placed. Catheter was left to gravity bag drainage.  A sample was sent for analysis.  Patient remained hemodynamically stable throughout.  No complications were encountered.  COMPLICATIONS: None  FINDINGS: Ultrasound survey demonstrates hydronephrosis of the bilateral kidneys.  Ten French percutaneous nephrostomy tube were well positioned within the left and subsequently right kidneys, as above.  IMPRESSION: Status post placement of bilateral percutaneous nephrostomy tube.  A sample was sent to the lab for analysis from each of the kidneys.  Signed,  Dulcy Fanny. Earleen Newport, DO  [facility]  [facility]   Electronically Signed   By: Corrie Mckusick D.O.   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 He has received up sizing of ureteral stents with his primary urology surgeon which has not relieved the obstruction.  He has been referred for bilateral percutaneous nephrostomy.  The patient is not responsive, and is attempting to removed himself from the fluoroscopy table upon initiation of the first attempt for percutaneous placement with moderate [place]. Anesthesia team was consulted for assistance with general endotracheal tube anesthesia and need for paralysis.  EXAM: IR NEPHROSTOMY PLACEMENT LEFT; IR NEPHROSTOMY PLACEMENT RIGHT  COMPARISON:  Ultrasound 03/23/2015, CT 02/18/2015  ANESTHESIA/SEDATION: Anesthesia team was present for general endotracheal tube anesthesia.  CONTRAST:  21mL OMNIPAQUE IOHEXOL 300 MG/ML  SOLN  MEDICATIONS: 400 mg IV Cipro  FLUOROSCOPY TIME:  1 minutes 12 seconds  TECHNIQUE: The procedure, risks, benefits, and alternatives were explained to the patient's family. Questions regarding the procedure were encouraged and answered. The patient understands and consents to the procedure.  The patient was induced with anesthesia with the anesthesia team present. He was then placed in prone position on the fluoroscopy table.  The bilateral flank was prepped with chlorhexidine in a sterile fashion, and a sterile drape was applied covering the operative field. A sterile gown and sterile gloves were used for the procedure. Local anesthesia was provided with 1% Lidocaine.  Left:  The left flank was interrogated with ultrasound and the left kidney identified. The kidney is hydronephrotic. A suitable access site on the skin overlying the lower pole, posterior calix was identified. After local mg anesthesia was achieved, a small skin nick was made with an 11 blade scalpel.  A 21 gauge Accustick needle was then advanced under direct sonographic guidance into the lower pole of the left  inferior kidney. A 0.018 inch wire was advanced under fluoroscopic guidance into the left renal collecting system. The Accustick sheath was then advanced over the wire and a 0.018 system exchanged for a 0.035 system. Gentle hand injection of contrast material confirms placement of the sheath within the renal collecting system. A 10-French Cook all-purpose drain was then placed and positioned under fluoroscopic guidance. The locking loop is well formed within the left renal pelvis. The catheter was secured to the skin with 2-0 Prolene and a sterile bandage was placed. Catheter was left to gravity bag drainage.  A sample was sent for analysis.  Right:  The right flank was interrogated with ultrasound and the left kidney identified. The kidney is hydronephrotic. A suitable access site on the skin overlying the lower pole, posterior calix was identified. After local mg anesthesia was achieved, a small skin nick was made with an 11 blade scalpel. A 21 gauge Accustick needle was then advanced under direct sonographic guidance into the lower pole of the right inferior kidney. A 0.018 inch wire was advanced under fluoroscopic guidance into the left renal collecting system. The Accustick sheath was then advanced over the wire and a 0.018 system exchanged for a 0.035 system. Gentle hand injection of contrast material confirms placement of the sheath within the renal collecting system. A 10-French Cook all-purpose drain was then placed and positioned under fluoroscopic guidance. The locking loop is well formed within the left renal pelvis. The catheter was secured to the skin with 2-0 Prolene and a sterile bandage was placed. Catheter was left to gravity bag drainage.  A sample was sent for analysis.  Patient remained hemodynamically stable throughout.  No complications were encountered.  COMPLICATIONS: None  FINDINGS: Ultrasound survey demonstrates hydronephrosis of the bilateral kidneys.  Ten French percutaneous nephrostomy  tube were well positioned within the left and subsequently right kidneys, as above.  IMPRESSION: Status post placement of bilateral percutaneous nephrostomy tube.  A sample was sent to the lab for analysis from each of the kidneys.  Signed,  Dulcy Fanny. Earleen Newport, DO  [facility]  [facility]   Electronically Signed   By: Corrie Mckusick D.O.   On: 04/07/2015 10:16    Micro Results     Recent Results (from the past 240 hour(s))  Blood culture (routine x 2)     Status: None (Preliminary result)   Collection Time: 04/23/15 10:03 PM  Result Value Ref Range Status   Specimen Description BLOOD LEFT HAND  Final   Special Requests BOTTLES DRAWN AEROBIC AND ANAEROBIC 5CC   Final   Culture NO GROWTH 3 DAYS  Final   Report Status PENDING  Incomplete  MRSA PCR Screening     Status: None   Collection Time: 04/24/15  2:34 AM  Result Value Ref Range Status   MRSA by PCR NEGATIVE NEGATIVE Final    Comment:        The GeneXpert MRSA Assay (FDA approved for NASAL specimens only), is one component of a comprehensive MRSA colonization surveillance program. It is not intended to diagnose MRSA infection nor to guide or monitor treatment for MRSA infections.   Blood culture (routine x 2)     Status: None (Preliminary result)   Collection Time: 04/24/15  3:15 AM  Result Value Ref Range Status   Specimen Description BLOOD RIGHT HAND  Final  Special Requests BOTTLES DRAWN AEROBIC ONLY 5CC  Final   Culture NO GROWTH 3 DAYS  Final   Report Status PENDING  Incomplete  Culture, Urine     Status: None   Collection Time: 04/26/15  1:17 AM  Result Value Ref Range Status   Specimen Description URINE, CATHETERIZED  Final   Special Requests NONE  Final   Culture NO GROWTH 1 DAY  Final   Report Status 04/27/2015 FINAL  Final       Today   Subjective:   Samuel [name] today has no fever, chills, cough or productive sputum.  Objective:   Blood pressure  101/66, pulse 103, temperature 98.5 F (36.9 C), temperature source Oral, resp. rate 18, height 5\' 4"  (1.626 m), weight 93.2 kg (205 lb 7.5 oz), SpO2 91 %.   Intake/Output Summary (Last 24 hours) at 04/27/15 1339 Last data filed at 04/27/15 1332  Gross per 24 hour  Intake   1162 ml  Output   4300 ml  Net  -3138 ml    Exam General: No acute respiratory distress Lungs: Clear to auscultation bilaterally without wheezes or crackles Cardiovascular: Regular rate and rhythm without murmur gallop or rub normal S1 and S2 Abdomen: Nontender, nondistended, soft, bowel sounds positive, colostomy+, bilateral nephrostomy tube, chronic indwelling Foley catheter Extremities: Left AKA   Data Review   CBC w Diff: Lab Results  Component Value Date   WBC 5.9 04/27/2015   WBC 9.2 03/14/2015   WBC 4.1 01/17/2015   HGB 7.9* 04/27/2015   HGB 7.8* 01/17/2015   HCT 25.0* 04/27/2015   HCT 20.7* 04/04/2015   HCT 24.2* 01/17/2015   PLT 300 04/27/2015   PLT 418* 01/17/2015   LYMPHOPCT 5* 04/03/2015   LYMPHOPCT 24.9 01/17/2015   MONOPCT 3 04/03/2015   MONOPCT 29.8* 01/17/2015   EOSPCT 2 04/03/2015   EOSPCT 0.0 01/17/2015   BASOPCT 0 04/03/2015   BASOPCT 0.5 01/17/2015    CMP: Lab Results  Component Value Date   NA 134* 04/27/2015   NA 134* 03/14/2015   NA 137 01/17/2015   K 3.7 04/27/2015   K 4.7 01/17/2015   CL 101 04/27/2015   CO2 25 04/27/2015   CO2 26 01/17/2015   BUN 8 04/27/2015   BUN 16 03/14/2015   BUN 17.1 01/17/2015   CREATININE 1.15 04/27/2015   CREATININE 1.4* 03/14/2015   CREATININE 0.8 01/17/2015   GLU 115 03/14/2015   PROT 6.6 04/26/2015   PROT 5.8* 01/17/2015   ALBUMIN 1.8* 04/26/2015   ALBUMIN 2.5* 01/17/2015   BILITOT 0.6 04/26/2015   BILITOT 0.25 01/17/2015   ALKPHOS 82 04/26/2015   ALKPHOS 107 01/17/2015   AST 19 04/26/2015   AST 28 01/17/2015   ALT 9* 04/26/2015   ALT 21 01/17/2015  .   Total Time in preparing paper work, data evaluation and todays  exam - 35 minutes  ELGERGAWY, DAWOOD M.D on 04/27/2015 at 1:39 PM  [facility]   Office  [phone]

## 2015-04-29 LAB — CULTURE, BLOOD (ROUTINE X 2)
CULTURE: NO GROWTH
CULTURE: NO GROWTH

## 2015-04-30 ENCOUNTER — Non-Acute Institutional Stay (SKILLED_NURSING_FACILITY): Payer: Medicare Other | Admitting: Internal Medicine

## 2015-04-30 ENCOUNTER — Encounter: Payer: Self-pay | Admitting: Internal Medicine

## 2015-04-30 DIAGNOSIS — R1310 Dysphagia, unspecified: Secondary | ICD-10-CM | POA: Diagnosis not present

## 2015-04-30 DIAGNOSIS — N390 Urinary tract infection, site not specified: Secondary | ICD-10-CM | POA: Diagnosis not present

## 2015-04-30 DIAGNOSIS — B952 Enterococcus as the cause of diseases classified elsewhere: Secondary | ICD-10-CM

## 2015-04-30 DIAGNOSIS — C689 Malignant neoplasm of urinary organ, unspecified: Secondary | ICD-10-CM | POA: Diagnosis not present

## 2015-04-30 DIAGNOSIS — A498 Other bacterial infections of unspecified site: Secondary | ICD-10-CM

## 2015-04-30 DIAGNOSIS — D689 Coagulation defect, unspecified: Secondary | ICD-10-CM | POA: Diagnosis not present

## 2015-04-30 DIAGNOSIS — J690 Pneumonitis due to inhalation of food and vomit: Secondary | ICD-10-CM | POA: Diagnosis not present

## 2015-04-30 DIAGNOSIS — A419 Sepsis, unspecified organism: Secondary | ICD-10-CM | POA: Diagnosis not present

## 2015-04-30 DIAGNOSIS — Z1621 Resistance to vancomycin: Secondary | ICD-10-CM

## 2015-04-30 DIAGNOSIS — N1339 Other hydronephrosis: Secondary | ICD-10-CM | POA: Diagnosis not present

## 2015-04-30 DIAGNOSIS — A491 Streptococcal infection, unspecified site: Secondary | ICD-10-CM

## 2015-04-30 DIAGNOSIS — G8929 Other chronic pain: Secondary | ICD-10-CM

## 2015-04-30 DIAGNOSIS — R569 Unspecified convulsions: Secondary | ICD-10-CM | POA: Diagnosis not present

## 2015-04-30 DIAGNOSIS — I10 Essential (primary) hypertension: Secondary | ICD-10-CM

## 2015-04-30 DIAGNOSIS — F3113 Bipolar disorder, current episode manic without psychotic features, severe: Secondary | ICD-10-CM

## 2015-04-30 DIAGNOSIS — E871 Hypo-osmolality and hyponatremia: Secondary | ICD-10-CM

## 2015-04-30 DIAGNOSIS — Z1612 Extended spectrum beta lactamase (ESBL) resistance: Secondary | ICD-10-CM

## 2015-04-30 DIAGNOSIS — L89159 Pressure ulcer of sacral region, unspecified stage: Secondary | ICD-10-CM
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
## 2015-05-01 ENCOUNTER — Encounter (HOSPITAL_COMMUNITY): Payer: Self-pay

## 2015-05-01 ENCOUNTER — Emergency Department (HOSPITAL_COMMUNITY): Payer: Medicare Other

## 2015-05-01 ENCOUNTER — Inpatient Hospital Stay (HOSPITAL_COMMUNITY)
Admission: EM | Admit: 2015-05-01 | Discharge: 2015-05-07 | DRG: 871 | Disposition: A | Discharge status: Skilled Nursing Facility | Payer: Medicare Other | Attending: Internal Medicine | Admitting: Internal Medicine

## 2015-05-01 DIAGNOSIS — Z1612 Extended spectrum beta lactamase (ESBL) resistance: Secondary | ICD-10-CM

## 2015-05-01 DIAGNOSIS — R1110 Vomiting, unspecified: Secondary | ICD-10-CM | POA: Insufficient documentation

## 2015-05-01 DIAGNOSIS — D6481 Anemia due to antineoplastic chemotherapy: Secondary | ICD-10-CM | POA: Diagnosis present

## 2015-05-01 DIAGNOSIS — A419 Sepsis, unspecified organism: Secondary | ICD-10-CM | POA: Diagnosis not present

## 2015-05-01 DIAGNOSIS — Z515 Encounter for palliative care: Secondary | ICD-10-CM | POA: Insufficient documentation

## 2015-05-01 DIAGNOSIS — G8220 Paraplegia, unspecified: Secondary | ICD-10-CM | POA: Diagnosis present

## 2015-05-01 DIAGNOSIS — F411 Generalized anxiety disorder: Secondary | ICD-10-CM | POA: Diagnosis present

## 2015-05-01 DIAGNOSIS — F319 Bipolar disorder, unspecified: Secondary | ICD-10-CM | POA: Diagnosis present

## 2015-05-01 DIAGNOSIS — K5900 Constipation, unspecified: Secondary | ICD-10-CM | POA: Diagnosis present

## 2015-05-01 DIAGNOSIS — C689 Malignant neoplasm of urinary organ, unspecified: Secondary | ICD-10-CM | POA: Diagnosis present

## 2015-05-01 DIAGNOSIS — A498 Other bacterial infections of unspecified site: Secondary | ICD-10-CM | POA: Diagnosis present

## 2015-05-01 DIAGNOSIS — I10 Essential (primary) hypertension: Secondary | ICD-10-CM | POA: Diagnosis present

## 2015-05-01 DIAGNOSIS — Z66 Do not resuscitate: Secondary | ICD-10-CM | POA: Diagnosis present

## 2015-05-01 DIAGNOSIS — G8929 Other chronic pain: Secondary | ICD-10-CM | POA: Diagnosis present

## 2015-05-01 DIAGNOSIS — G40909 Epilepsy, unspecified, not intractable, without status epilepticus: Secondary | ICD-10-CM | POA: Diagnosis present

## 2015-05-01 DIAGNOSIS — E2740 Unspecified adrenocortical insufficiency: Secondary | ICD-10-CM | POA: Diagnosis present

## 2015-05-01 DIAGNOSIS — Z933 Colostomy status: Secondary | ICD-10-CM

## 2015-05-01 DIAGNOSIS — Z89612 Acquired absence of left leg above knee: Secondary | ICD-10-CM

## 2015-05-01 DIAGNOSIS — Z91018 Allergy to other foods: Secondary | ICD-10-CM

## 2015-05-01 DIAGNOSIS — Z79899 Other long term (current) drug therapy: Secondary | ICD-10-CM

## 2015-05-01 DIAGNOSIS — J189 Pneumonia, unspecified organism: Secondary | ICD-10-CM | POA: Diagnosis present

## 2015-05-01 DIAGNOSIS — E876 Hypokalemia: Secondary | ICD-10-CM | POA: Diagnosis present

## 2015-05-01 DIAGNOSIS — R8271 Bacteriuria: Secondary | ICD-10-CM | POA: Insufficient documentation

## 2015-05-01 DIAGNOSIS — K219 Gastro-esophageal reflux disease without esophagitis: Secondary | ICD-10-CM | POA: Diagnosis present

## 2015-05-01 DIAGNOSIS — T451X5A Adverse effect of antineoplastic and immunosuppressive drugs, initial encounter: Secondary | ICD-10-CM | POA: Diagnosis present

## 2015-05-01 DIAGNOSIS — E785 Hyperlipidemia, unspecified: Secondary | ICD-10-CM | POA: Diagnosis present

## 2015-05-01 DIAGNOSIS — N390 Urinary tract infection, site not specified: Secondary | ICD-10-CM | POA: Diagnosis present

## 2015-05-01 DIAGNOSIS — Z87891 Personal history of nicotine dependence: Secondary | ICD-10-CM

## 2015-05-01 DIAGNOSIS — J690 Pneumonitis due to inhalation of food and vomit: Secondary | ICD-10-CM | POA: Diagnosis present

## 2015-05-01 MED ORDER — SODIUM CHLORIDE 0.9 % IV BOLUS (SEPSIS): 1000.0000 mL | Freq: Once | INTRAVENOUS | Status: DC

## 2015-05-01 MED ORDER — SODIUM CHLORIDE 0.9 % IV BOLUS (SEPSIS)
1000.0000 mL | INTRAVENOUS | Status: AC
  Administered 2015-05-01 – 2015-05-02 (×2): 1000 mL via INTRAVENOUS
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
## 2015-05-01 NOTE — ED Notes (Signed)
Pt arrived via EMS from [place] c/o left flank pain.  Pt presents with foley bag and colostomy bag.

## 2015-05-01 NOTE — ED Notes (Signed)
Called Code sepsis

## 2015-05-02 DIAGNOSIS — R1111 Vomiting without nausea: Secondary | ICD-10-CM | POA: Diagnosis not present

## 2015-05-02 DIAGNOSIS — T451X5A Adverse effect of antineoplastic and immunosuppressive drugs, initial encounter: Secondary | ICD-10-CM | POA: Diagnosis present

## 2015-05-02 DIAGNOSIS — N390 Urinary tract infection, site not specified: Secondary | ICD-10-CM | POA: Diagnosis present

## 2015-05-02 DIAGNOSIS — E2740 Unspecified adrenocortical insufficiency: Secondary | ICD-10-CM | POA: Diagnosis present

## 2015-05-02 DIAGNOSIS — K5900 Constipation, unspecified: Secondary | ICD-10-CM | POA: Diagnosis present

## 2015-05-02 DIAGNOSIS — I10 Essential (primary) hypertension: Secondary | ICD-10-CM | POA: Diagnosis present

## 2015-05-02 DIAGNOSIS — E876 Hypokalemia: Secondary | ICD-10-CM | POA: Diagnosis present

## 2015-05-02 DIAGNOSIS — D6481 Anemia due to antineoplastic chemotherapy: Secondary | ICD-10-CM | POA: Diagnosis present

## 2015-05-02 DIAGNOSIS — A498 Other bacterial infections of unspecified site: Secondary | ICD-10-CM

## 2015-05-02 DIAGNOSIS — C689 Malignant neoplasm of urinary organ, unspecified: Secondary | ICD-10-CM | POA: Diagnosis present

## 2015-05-02 DIAGNOSIS — A419 Sepsis, unspecified organism: Principal | ICD-10-CM

## 2015-05-02 DIAGNOSIS — Z933 Colostomy status: Secondary | ICD-10-CM | POA: Diagnosis not present

## 2015-05-02 DIAGNOSIS — K219 Gastro-esophageal reflux disease without esophagitis: Secondary | ICD-10-CM | POA: Diagnosis present

## 2015-05-02 DIAGNOSIS — J690 Pneumonitis due to inhalation of food and vomit: Secondary | ICD-10-CM | POA: Diagnosis present

## 2015-05-02 DIAGNOSIS — G8929 Other chronic pain: Secondary | ICD-10-CM | POA: Diagnosis present

## 2015-05-02 DIAGNOSIS — Z1612 Extended spectrum beta lactamase (ESBL) resistance: Secondary | ICD-10-CM

## 2015-05-02 DIAGNOSIS — G40909 Epilepsy, unspecified, not intractable, without status epilepticus: Secondary | ICD-10-CM | POA: Diagnosis present

## 2015-05-02 DIAGNOSIS — F411 Generalized anxiety disorder: Secondary | ICD-10-CM | POA: Diagnosis present

## 2015-05-02 DIAGNOSIS — G8220 Paraplegia, unspecified: Secondary | ICD-10-CM | POA: Diagnosis present

## 2015-05-02 DIAGNOSIS — E785 Hyperlipidemia, unspecified: Secondary | ICD-10-CM | POA: Diagnosis present

## 2015-05-02 DIAGNOSIS — R8271 Bacteriuria: Secondary | ICD-10-CM | POA: Diagnosis not present

## 2015-05-02 DIAGNOSIS — Z87891 Personal history of nicotine dependence: Secondary | ICD-10-CM | POA: Diagnosis not present

## 2015-05-02 DIAGNOSIS — F319 Bipolar disorder, unspecified: Secondary | ICD-10-CM | POA: Diagnosis present

## 2015-05-02 DIAGNOSIS — Z66 Do not resuscitate: Secondary | ICD-10-CM | POA: Diagnosis present

## 2015-05-02 DIAGNOSIS — Z79899 Other long term (current) drug therapy: Secondary | ICD-10-CM | POA: Diagnosis not present

## 2015-05-02 DIAGNOSIS — R112 Nausea with vomiting, unspecified: Secondary | ICD-10-CM | POA: Diagnosis not present

## 2015-05-02 DIAGNOSIS — Z91018 Allergy to other foods: Secondary | ICD-10-CM | POA: Diagnosis not present

## 2015-05-02 DIAGNOSIS — Z89612 Acquired absence of left leg above knee: Secondary | ICD-10-CM | POA: Diagnosis not present

## 2015-05-02 DIAGNOSIS — J189 Pneumonia, unspecified organism: Secondary | ICD-10-CM | POA: Diagnosis not present

## 2015-05-02 LAB — URINALYSIS, ROUTINE W REFLEX MICROSCOPIC
BILIRUBIN URINE: NEGATIVE
BILIRUBIN URINE: NEGATIVE
BILIRUBIN URINE: NEGATIVE
Glucose, UA: NEGATIVE mg/dL
Glucose, UA: NEGATIVE mg/dL
Glucose, UA: NEGATIVE mg/dL
KETONES UR: NEGATIVE mg/dL
Ketones, ur: NEGATIVE mg/dL
Ketones, ur: NEGATIVE mg/dL
NITRITE: NEGATIVE
Nitrite: NEGATIVE
Nitrite: NEGATIVE
PH: 7 (ref 5.0–8.0)
Protein, ur: NEGATIVE mg/dL
Protein, ur: NEGATIVE mg/dL
SPECIFIC GRAVITY, URINE: 1.005 (ref 1.005–1.030)
Specific Gravity, Urine: 1.004 — ABNORMAL LOW (ref 1.005–1.030)
Specific Gravity, Urine: 1.026 (ref 1.005–1.030)
UROBILINOGEN UA: 0.2 mg/dL (ref 0.0–1.0)
UROBILINOGEN UA: 0.2 mg/dL (ref 0.0–1.0)
UROBILINOGEN UA: 0.2 mg/dL (ref 0.0–1.0)
pH: 6.5 (ref 5.0–8.0)
pH: 7.5 (ref 5.0–8.0)

## 2015-05-02 LAB — COMPREHENSIVE METABOLIC PANEL
ALK PHOS: 78 U/L (ref 38–126)
ALT: 8 U/L — ABNORMAL LOW (ref 17–63)
ANION GAP: 9 (ref 5–15)
AST: 16 U/L (ref 15–41)
Albumin: 2 g/dL — ABNORMAL LOW (ref 3.5–5.0)
BUN: 8 mg/dL (ref 6–20)
CALCIUM: 8.1 mg/dL — AB (ref 8.9–10.3)
CO2: 22 mmol/L (ref 22–32)
Chloride: 103 mmol/L (ref 101–111)
Creatinine, Ser: 0.99 mg/dL (ref 0.61–1.24)
GFR calc non Af Amer: >60 mL/min (ref >60)
Glucose, Bld: 110 mg/dL — ABNORMAL HIGH (ref 65–99)
Potassium: 3.4 mmol/L — ABNORMAL LOW (ref 3.5–5.1)
SODIUM: 134 mmol/L — AB (ref 135–145)
Total Bilirubin: 0.5 mg/dL (ref 0.3–1.2)
Total Protein: 6.6 g/dL (ref 6.5–8.1)

## 2015-05-02 LAB — CBC WITH DIFFERENTIAL/PLATELET
Basophils Absolute: 0.1 10*3/uL (ref 0.0–0.1)
Basophils Relative: 1 %
EOS ABS: 0.5 10*3/uL (ref 0.0–0.7)
EOS PCT: 5 %
HCT: 25.9 % — ABNORMAL LOW (ref 39.0–52.0)
HEMOGLOBIN: 8 g/dL — AB (ref 13.0–17.0)
LYMPHS ABS: 1.1 10*3/uL (ref 0.7–4.0)
Lymphocytes Relative: 12 %
MCH: 27.1 pg (ref 26.0–34.0)
MCHC: 30.9 g/dL (ref 30.0–36.0)
MCV: 87.8 fL (ref 78.0–100.0)
MONO ABS: 0.9 10*3/uL (ref 0.1–1.0)
MONOS PCT: 10 %
Neutro Abs: 6.7 10*3/uL (ref 1.7–7.7)
Neutrophils Relative %: 72 %
PLATELETS: 322 10*3/uL (ref 150–400)
RBC: 2.95 MIL/uL — ABNORMAL LOW (ref 4.22–5.81)
RDW: 16.1 % — ABNORMAL HIGH (ref 11.5–15.5)
WBC: 9.2 10*3/uL (ref 4.0–10.5)

## 2015-05-02 LAB — URINE MICROSCOPIC-ADD ON

## 2015-05-02 LAB — I-STAT CG4 LACTIC ACID, ED: Lactic Acid, Venous: 1.24 mmol/L (ref 0.5–2.0)

## 2015-05-02 LAB — MRSA PCR SCREENING: MRSA by PCR: NEGATIVE

## 2015-05-02 LAB — LIPASE, BLOOD: Lipase: 13 U/L — ABNORMAL LOW (ref 22–51)

## 2015-05-02 MED ORDER — AMLODIPINE BESYLATE 5 MG PO TABS: 5.0000 mg | ORAL_TABLET | Freq: Every day | ORAL | Status: DC

## 2015-05-02 MED ORDER — HYDROMORPHONE HCL 1 MG/ML IJ SOLN
0.5000 mg | INTRAMUSCULAR | Status: DC | PRN
  Administered 2015-05-02 – 2015-05-07 (×25): 1 mg via INTRAVENOUS
  Filled 2015-05-02 (×26): qty 1

## 2015-05-02 MED ORDER — CLONAZEPAM 0.5 MG PO TABS
0.2500 mg | ORAL_TABLET | Freq: Two times a day (BID) | ORAL | Status: DC
  Administered 2015-05-02 (×2): 0.25 mg via ORAL
  Filled 2015-05-02 (×3): qty 1

## 2015-05-02 MED ORDER — ACETAMINOPHEN 325 MG PO TABS
650.0000 mg | ORAL_TABLET | Freq: Four times a day (QID) | ORAL | Status: DC | PRN
  Administered 2015-05-02 – 2015-05-04 (×2): 650 mg via ORAL
  Filled 2015-05-02 (×2): qty 2

## 2015-05-02 MED ORDER — SENNA 8.6 MG PO TABS
17.2000 mg | ORAL_TABLET | Freq: Every morning | ORAL | Status: DC
  Administered 2015-05-02: 17.2 mg via ORAL
  Filled 2015-05-02: qty 2

## 2015-05-02 MED ORDER — METOCLOPRAMIDE HCL 5 MG PO TABS
5.0000 mg | ORAL_TABLET | Freq: Three times a day (TID) | ORAL | Status: DC
Start: 2015-05-02 — End: 2015-05-07
  Administered 2015-05-02 – 2015-05-07 (×15): 5 mg via ORAL
  Filled 2015-05-02 (×15): qty 1

## 2015-05-02 MED ORDER — SODIUM CHLORIDE 0.9 % IV SOLN
500.0000 mg | Freq: Once | INTRAVENOUS | Status: AC
  Administered 2015-05-02: 500 mg via INTRAVENOUS
  Filled 2015-05-02: qty 500

## 2015-05-02 MED ORDER — DEXTROSE 5 % IV SOLN
500.0000 mg | Freq: Once | INTRAVENOUS | Status: AC
  Administered 2015-05-02: 500 mg via INTRAVENOUS
  Filled 2015-05-02: qty 500

## 2015-05-02 MED ORDER — ATORVASTATIN CALCIUM 10 MG PO TABS
10.0000 mg | ORAL_TABLET | Freq: Every day | ORAL | Status: DC
  Administered 2015-05-02 – 2015-05-06 (×5): 10 mg via ORAL
  Filled 2015-05-02 (×5): qty 1

## 2015-05-02 MED ORDER — METOPROLOL TARTRATE 25 MG PO TABS: 25.0000 mg | ORAL_TABLET | Freq: Two times a day (BID) | ORAL | Status: DC

## 2015-05-02 MED ORDER — PANTOPRAZOLE SODIUM 40 MG PO TBEC
40.0000 mg | DELAYED_RELEASE_TABLET | Freq: Every day | ORAL | Status: DC
  Administered 2015-05-02 – 2015-05-07 (×6): 40 mg via ORAL
  Filled 2015-05-02 (×6): qty 1

## 2015-05-02 MED ORDER — VANCOMYCIN HCL IN DEXTROSE 750-5 MG/150ML-% IV SOLN
750.0000 mg | Freq: Two times a day (BID) | INTRAVENOUS | Status: DC
  Administered 2015-05-02: 750 mg via INTRAVENOUS
  Filled 2015-05-02 (×2): qty 150

## 2015-05-02 MED ORDER — ZOLPIDEM TARTRATE 5 MG PO TABS: 5.0000 mg | ORAL_TABLET | Freq: Once | ORAL | Status: DC

## 2015-05-02 MED ORDER — FOLIC ACID 1 MG PO TABS
1.0000 mg | ORAL_TABLET | Freq: Every day | ORAL | Status: DC
  Administered 2015-05-02 – 2015-05-07 (×5): 1 mg via ORAL
  Filled 2015-05-02 (×5): qty 1

## 2015-05-02 MED ORDER — LAMOTRIGINE 200 MG PO TABS
200.0000 mg | ORAL_TABLET | Freq: Two times a day (BID) | ORAL | Status: DC
  Administered 2015-05-02 – 2015-05-07 (×10): 200 mg via ORAL
  Filled 2015-05-02 (×12): qty 1

## 2015-05-02 MED ORDER — DOCUSATE SODIUM 100 MG PO CAPS
100.0000 mg | ORAL_CAPSULE | Freq: Two times a day (BID) | ORAL | Status: DC
  Administered 2015-05-02 (×2): 100 mg via ORAL
  Filled 2015-05-02 (×2): qty 1

## 2015-05-02 MED ORDER — SODIUM CHLORIDE 0.9 % IV SOLN
500.0000 mg | Freq: Four times a day (QID) | INTRAVENOUS | Status: DC
  Administered 2015-05-02 – 2015-05-07 (×21): 500 mg via INTRAVENOUS
  Filled 2015-05-02 (×25): qty 500

## 2015-05-02 MED ORDER — SODIUM CHLORIDE 0.9 % IV SOLN
500.0000 mg | Freq: Three times a day (TID) | INTRAVENOUS | Status: DC
  Filled 2015-05-02 (×2): qty 500

## 2015-05-02 MED ORDER — GUAIFENESIN 100 MG/5ML PO SOLN
15.0000 mL | Freq: Three times a day (TID) | ORAL | Status: DC | PRN
  Filled 2015-05-02: qty 15

## 2015-05-02 MED ORDER — SODIUM CHLORIDE 0.9 % IV SOLN
INTRAVENOUS | Status: DC
  Administered 2015-05-02 – 2015-05-03 (×3): via INTRAVENOUS
  Administered 2015-05-03: 1000 mL via INTRAVENOUS
  Administered 2015-05-04: 03:00:00 via INTRAVENOUS

## 2015-05-02 MED ORDER — FERROUS SULFATE 325 (65 FE) MG PO TABS
325.0000 mg | ORAL_TABLET | Freq: Two times a day (BID) | ORAL | Status: DC
  Administered 2015-05-02 – 2015-05-07 (×9): 325 mg via ORAL
  Filled 2015-05-02 (×10): qty 1

## 2015-05-02 MED ORDER — HEPARIN SODIUM (PORCINE) 5000 UNIT/ML IJ SOLN
5000.0000 [IU] | Freq: Three times a day (TID) | INTRAMUSCULAR | Status: DC
  Administered 2015-05-02 – 2015-05-07 (×17): 5000 [IU] via SUBCUTANEOUS
  Filled 2015-05-02 (×16): qty 1

## 2015-05-02 MED ORDER — ZOLPIDEM TARTRATE 5 MG PO TABS
5.0000 mg | ORAL_TABLET | Freq: Every evening | ORAL | Status: DC | PRN
  Administered 2015-05-02 – 2015-05-06 (×5): 5 mg via ORAL
  Filled 2015-05-02 (×5): qty 1

## 2015-05-02 MED ORDER — VANCOMYCIN HCL 10 G IV SOLR
1500.0000 mg | Freq: Once | INTRAVENOUS | Status: AC
  Administered 2015-05-02: 1500 mg via INTRAVENOUS
  Filled 2015-05-02: qty 1500

## 2015-05-02 MED ORDER — PIPERACILLIN-TAZOBACTAM 3.375 G IVPB 30 MIN: 3.3750 g | Freq: Once | INTRAVENOUS | Status: DC

## 2015-05-02 MED ORDER — PROMETHAZINE HCL 25 MG/ML IJ SOLN
25.0000 mg | INTRAMUSCULAR | Status: DC | PRN
  Administered 2015-05-02 – 2015-05-03 (×4): 25 mg via INTRAMUSCULAR
  Filled 2015-05-02 (×4): qty 1

## 2015-05-02 MED ORDER — HYDROMORPHONE HCL 2 MG PO TABS: 2.0000 mg | ORAL_TABLET | ORAL | Status: DC | PRN

## 2015-05-02 MED ORDER — HYDROCORTISONE NA SUCCINATE PF 100 MG IJ SOLR: 50.0000 mg | Freq: Four times a day (QID) | INTRAMUSCULAR | Status: DC

## 2015-05-02 MED ORDER — SODIUM CHLORIDE 0.9 % IV SOLN
1250.0000 mg | Freq: Two times a day (BID) | INTRAVENOUS | Status: DC
  Administered 2015-05-02 – 2015-05-04 (×5): 1250 mg via INTRAVENOUS
  Filled 2015-05-02 (×6): qty 1250

## 2015-05-02 MED ORDER — FENTANYL 25 MCG/HR TD PT72
25.0000 ug | MEDICATED_PATCH | TRANSDERMAL | Status: DC
  Administered 2015-05-03: 25 ug via TRANSDERMAL
  Filled 2015-05-02: qty 1

## 2015-05-02 NOTE — Progress Notes (Signed)
Pt arrived to [facility] around 0300 from the ED. Pts VS's are stable, pt complains of severe back and hip pain.  Pt nephrostomy tube dressings were very gross and the tubes were fully exposed and the site is red/yellow and looks like it's pulling out of the skin.  Both sites were covered with gauze and taped down.  The right nephrostomy tube is putting out double what the left is putting out.  Urine output has been minimum and appears milky.  BP stable with systolic low 185'U.  Afebrile.  RN will continue to monitor.
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
## 2015-05-02 NOTE — Progress Notes (Signed)
I have seen and assessed patient and agree with Dr Gardner's assessment and plan. 

## 2015-05-02 NOTE — ED Notes (Signed)
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  Unable to get rectal temp.  MD notified.

## 2015-05-02 NOTE — Progress Notes (Signed)
   05/02/15 1300  Clinical Encounter Type  Visited With Patient  Visit Type Initial  Referral From Nurse  Stress Factors  Patient Stress Factors Health changes;Major life changes  Chaplain visited with Pt; Pt is in need of Idelle Crouch to come in; Chaplain can be there if Pt needs to talk
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
## 2015-05-03 ENCOUNTER — Inpatient Hospital Stay (HOSPITAL_COMMUNITY): Payer: Medicare Other

## 2015-05-03 DIAGNOSIS — N390 Urinary tract infection, site not specified: Secondary | ICD-10-CM

## 2015-05-03 DIAGNOSIS — C689 Malignant neoplasm of urinary organ, unspecified: Secondary | ICD-10-CM

## 2015-05-03 DIAGNOSIS — E876 Hypokalemia: Secondary | ICD-10-CM

## 2015-05-03 DIAGNOSIS — R112 Nausea with vomiting, unspecified: Secondary | ICD-10-CM

## 2015-05-03 DIAGNOSIS — J189 Pneumonia, unspecified organism: Secondary | ICD-10-CM

## 2015-05-03 DIAGNOSIS — Z515 Encounter for palliative care: Secondary | ICD-10-CM

## 2015-05-03 DIAGNOSIS — R1111 Vomiting without nausea: Secondary | ICD-10-CM

## 2015-05-03 DIAGNOSIS — G8929 Other chronic pain: Secondary | ICD-10-CM

## 2015-05-03 DIAGNOSIS — R1110 Vomiting, unspecified: Secondary | ICD-10-CM | POA: Insufficient documentation

## 2015-05-03 LAB — URINE CULTURE
Culture: NO GROWTH
Culture: NO GROWTH

## 2015-05-03 MED ORDER — SENNOSIDES-DOCUSATE SODIUM 8.6-50 MG PO TABS
1.0000 | ORAL_TABLET | Freq: Two times a day (BID) | ORAL | Status: DC
  Administered 2015-05-03 (×2): 1 via ORAL
  Filled 2015-05-03 (×2): qty 1

## 2015-05-03 MED ORDER — LORAZEPAM 0.5 MG PO TABS
0.5000 mg | ORAL_TABLET | Freq: Two times a day (BID) | ORAL | Status: DC
  Administered 2015-05-03 – 2015-05-07 (×8): 0.5 mg via ORAL
  Filled 2015-05-03 (×8): qty 1

## 2015-05-03 MED ORDER — LORAZEPAM 0.5 MG PO TABS
0.5000 mg | ORAL_TABLET | Freq: Three times a day (TID) | ORAL | Status: DC | PRN
  Administered 2015-05-03 – 2015-05-07 (×9): 0.5 mg via ORAL
  Filled 2015-05-03 (×9): qty 1

## 2015-05-03 MED ORDER — METOPROLOL TARTRATE 1 MG/ML IV SOLN
5.0000 mg | Freq: Three times a day (TID) | INTRAVENOUS | Status: DC
Start: 2015-05-03 — End: 2015-05-04
  Administered 2015-05-03 – 2015-05-04 (×4): 5 mg via INTRAVENOUS
  Filled 2015-05-03 (×4): qty 5

## 2015-05-03 MED ORDER — PROCHLORPERAZINE EDISYLATE 5 MG/ML IJ SOLN
10.0000 mg | Freq: Four times a day (QID) | INTRAMUSCULAR | Status: DC | PRN
  Administered 2015-05-03 – 2015-05-04 (×4): 10 mg via INTRAVENOUS
  Filled 2015-05-03 (×6): qty 2

## 2015-05-03 MED ORDER — POTASSIUM CHLORIDE 10 MEQ/100ML IV SOLN
10.0000 meq | INTRAVENOUS | Status: AC
  Administered 2015-05-03 (×3): 10 meq via INTRAVENOUS
  Filled 2015-05-03 (×3): qty 100

## 2015-05-03 NOTE — Progress Notes (Signed)
[facility] PROGRESS NOTE  Samuel [name] JJO:[id number] DOB: [birth year] DOA: 05/01/2015 PCP: [name], MD  Assessment/Plan: #1 sepsis secondary to probable UTI and HCAP/aspiration pneumonia Patient currently nauseous with some emesis. Patient is afebrile. WBC is normalized. Patient has been pancultured cultures are pending. Continue empiric IV vancomycin and IV Primaxin. Aspiration precautions. Currently on a dysphagia 2 diet. Follow.  #2 HCAP versus aspiration pneumonia Per chest x-ray. Patient had presented with fever, chills, tachycardia. Patient with nausea and emesis this morning. Patient is afebrile. Sputum is pending. Continue empiric Vancomycin IV Primaxin. Follow.  #3 UTI Urine cultures pending. Continue empiric IV Primaxin.  #4 urothelial cancer Patient is currently a DO NOT RESUSCITATE CODE STATUS. Per last admission for this was supposed to be primary on comfort. Palliative care consultation pending for goals of care and symptom management. Follow.  #5 hypertension Blood pressure has improved. We'll place on IV Lopressor.  #6 nausea and emesis Questionable etiology. May be secondary to constipation. Patient states hasn't had a bowel movement in about 3 days. Patient with a colostomy 2. Will get a KUB. Soapsuds enema. Antiemetics. Supportive care.  #7 hypokalemia Replete.  #8 anemia H&H stable. No overt bleeding. Continue oral iron tablets. Follow.  #9 chronic pain Continue fentanyl patch.Dilaudid prn.  #10 prophylaxis PPI for GI prophylaxis. Heparin for DVT prophylaxis.   Code Status: DO NOT RESUSCITATE Family Communication: Updated patient. No family present. Disposition Plan: Remain in the stepdown unit.   Consultants:  Palliative care pending  Procedures:  Chest x-ray 05/01/2015  Antibiotics:  IV Primaxin 05/02/2015  IV vancomycin 05/02/2015  HPI/Subjective: Patient with nausea and emesis last night and this morning per  nursing. Patient denies any back pain. Patient states last bowel movement was about 3 days ago.  Objective: Filed Vitals:   05/03/15 0755  BP: 144/89  Pulse: 106  Temp: 97.3 F (36.3 C)  Resp: 25    Intake/Output Summary (Last 24 hours) at 05/03/15 0941 Last data filed at 05/03/15 0746  Gross per 24 hour  Intake 8242.92 ml  Output   1385 ml  Net 6857.92 ml   Filed Weights   05/02/15 0225  Weight: 92.987 kg (205 lb)    Exam:   General:  NAD  Cardiovascular: Tachycardia  Respiratory: CTAB   Abdomen:  soft, nontender, nondistended, positive bowel sounds. Colostomy  intact with no output noted. Urostomy tubes intact and draining urine   Musculoskeletal:  no clubbing cyanosis or edema.  Data Reviewed: Basic Metabolic Panel:  Recent Labs Lab 04/27/15 0426 05/02/15 0025  NA 134* 134*  K 3.7 3.4*  CL 101 103  CO2 25 22  GLUCOSE 103* 110*  BUN 8 8  CREATININE 1.15 0.99  CALCIUM 8.0* 8.1*   Liver Function Tests:  Recent Labs Lab 05/02/15 0025  AST 16  ALT 8*  ALKPHOS 78  BILITOT 0.5  PROT 6.6  ALBUMIN 2.0*    Recent Labs Lab 05/02/15 0025  LIPASE 13*   No results for input(s): AMMONIA in the last 168 hours. CBC:  Recent Labs Lab 04/27/15 0426 05/02/15 0025  WBC 5.9 9.2  NEUTROABS  --  6.7  HGB 7.9* 8.0*  HCT 25.0* 25.9*  MCV 88.7 87.8  PLT 300 322   Cardiac Enzymes: No results for input(s): CKTOTAL, CKMB, CKMBINDEX, TROPONINI in the last 168 hours. BNP (last 3 results)  Recent Labs  09/17/14 1651  BNP 41.0    ProBNP (last 3 results) No results for input(s): PROBNP in the  last 8760 hours.  CBG: No results for input(s): GLUCAP in the last 168 hours.  Recent Results (from the past 240 hour(s))  Blood culture (routine x 2)     Status: None   Collection Time: 04/23/15 10:03 PM  Result Value Ref Range Status   Specimen Description BLOOD LEFT HAND  Final   Special Requests BOTTLES DRAWN AEROBIC AND ANAEROBIC 5CC   Final    Culture NO GROWTH 5 DAYS  Final   Report Status 04/29/2015 FINAL  Final  MRSA PCR Screening     Status: None   Collection Time: 04/24/15  2:34 AM  Result Value Ref Range Status   MRSA by PCR NEGATIVE NEGATIVE Final    Comment:        The GeneXpert MRSA Assay (FDA approved for NASAL specimens only), is one component of a comprehensive MRSA colonization surveillance program. It is not intended to diagnose MRSA infection nor to guide or monitor treatment for MRSA infections.   Blood culture (routine x 2)     Status: None   Collection Time: 04/24/15  3:15 AM  Result Value Ref Range Status   Specimen Description BLOOD RIGHT HAND  Final   Special Requests BOTTLES DRAWN AEROBIC ONLY 5CC  Final   Culture NO GROWTH 5 DAYS  Final   Report Status 04/29/2015 FINAL  Final  Culture, Urine     Status: None   Collection Time: 04/26/15  1:17 AM  Result Value Ref Range Status   Specimen Description URINE, CATHETERIZED  Final   Special Requests NONE  Final   Culture NO GROWTH 1 DAY  Final   Report Status 04/27/2015 FINAL  Final  MRSA PCR Screening     Status: None   Collection Time: 05/02/15  3:09 AM  Result Value Ref Range Status   MRSA by PCR NEGATIVE NEGATIVE Final    Comment:        The GeneXpert MRSA Assay (FDA approved for NASAL specimens only), is one component of a comprehensive MRSA colonization surveillance program. It is not intended to diagnose MRSA infection nor to guide or monitor treatment for MRSA infections.      Studies: Dg Chest Port 1 View  05/02/2015   CLINICAL DATA:  Fever and myalgias  EXAM: PORTABLE CHEST 1 VIEW  COMPARISON:  04/23/2015  FINDINGS: There is persistent right apical opacity with more confluent consolidation compared to 04/23/2015. Linear basilar opacities have worsened bilaterally. There is unchanged right hemidiaphragm elevation.  IMPRESSION: Worsening right upper lobe consolidation.   Electronically Signed   By: Andreas Newport M.D.   On:  05/02/2015 00:24    Scheduled Meds: . atorvastatin  10 mg Oral q1800  . clonazePAM  0.25 mg Oral BID  . docusate sodium  100 mg Oral BID  . fentaNYL  25 mcg Transdermal Q72H  . ferrous sulfate  325 mg Oral BID WC  . folic acid  1 mg Oral Daily  . heparin  5,000 Units Subcutaneous 3 times per day  . imipenem-cilastatin  500 mg Intravenous 4 times per day  . lamoTRIgine  200 mg Oral BID  . metoCLOPramide  5 mg Oral TID AC  . metoprolol  5 mg Intravenous 3 times per day  . pantoprazole  40 mg Oral Daily  . senna  17.2 mg Oral q morning - 10a  . vancomycin  1,250 mg Intravenous Q12H   Continuous Infusions: . sodium chloride 125 mL/hr at 05/03/15 9390    Principal Problem:  Sepsis ([place]) Active Problems:   Hypokalemia   Chronic pain   Dyslipidemia   HTN (hypertension)   HCAP (healthcare-associated pneumonia)   Urothelial cancer ([place])   Anemia   Infection due to ESBL-producing Klebsiella pneumoniae   Aspiration pneumonia (HCC)   UTI (lower urinary tract infection)    Time spent: 42 minutes    THOMPSON,DANIEL M.D. [facility] Pager [phone]. If 7PM-7AM, please contact night-coverage at [email or web address], password [email or web address] 05/03/2015, 9:41 AM  LOS: 1 day
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
## 2015-05-03 NOTE — Progress Notes (Signed)
Call to DR Grandville Silos for pt. Pt says he needs something for anxiety bad - feels like he is jumping out of his skin. Fast paced speech doesn't want me to call DR out of his room trying to talk to me while MD is giving orders - clarified orders twice.

## 2015-05-03 NOTE — Progress Notes (Signed)
Port Xray 1 view of ABD done at bedside .

## 2015-05-03 NOTE — Progress Notes (Signed)
Pt is aware that Ativan is ordered calling every 5 minutes to see why he can't get it yet. Reminded pt that as soon as pharmacist reviews the med I will pull it from the P[facility] & give it to him.

## 2015-05-03 NOTE — Progress Notes (Signed)
Small red rubber catheter gently inserted into colostomy to give pt an enema as ordered by Dr Grandville Silos. Used 600 cc of warm tap water  Pt tolerated procedure well almost immediate return of 6 formed pieces of stool. Pt has colostomy bag on. Within 15 " pt has small to mod amy of soft to formed stool in bag

## 2015-05-03 NOTE — Clinical Social Work Note (Signed)
Clinical Social Work Assessment  Patient Details  Name: Samuel [name] MRN: [id number] Date of Birth: 06-01-1956  Date of referral:  05/03/15               Reason for consult:                   Permission sought to share information with:  Chartered certified accountant granted to share information::  Yes, Verbal Permission Granted  Name::        Agency::  [facility]  Relationship::     Contact Information:     Housing/Transportation Living arrangements for the past 2 months:  [place] of Information:  Patient Patient Interpreter Needed:  None Criminal Activity/Legal Involvement Pertinent to Current Situation/Hospitalization:  No - Comment as needed Significant Relationships:  None Lives with:  Facility Resident Do you feel safe going back to the place where you live?  No Need for family participation in patient care:  No (Coment)  Care giving concerns: pt has some caregiving concerns about [facility]- states he does not have his pain controlled at the facility   Social Worker assessment / plan:  CSW spoke with pt about return to [place] when medically stable  Employment status:  Disabled (Comment on whether or not currently receiving Disability) Insurance information:  Managed Care, [place] PT Recommendations:  Not assessed at this time Information / Referral to community resources:  [place]  Patient/Family's Response to care:  Patient is not sure he would like to return to [place].  Pt states that he was at [facility] for several weeks and wants to return but owes them $10,000.    Pt then went to [facility] center for a little over a week and returned to the hospital wanting to change facilities.  Pt is now saying he does not want to return to [place].  CSW explained that there might be a barrier to changing facilities since he is likely out of Medicare days- pt seems to accept this and is agreeable to  returning to [facility] if that is the only option.  Patient/Family's Understanding of and Emotional Response to Diagnosis, Current Treatment, and Prognosis: unclear- pt seems to understand the severity of his condition and did not have any questions or concerns at this time  Emotional Assessment Appearance:  Appears stated age Attitude/Demeanor/Rapport:    Affect (typically observed):  Appropriate, Tearful/Crying Orientation:  Oriented to Self, Oriented to Place, Oriented to  Time, Oriented to Situation Alcohol / Substance use:  Not Applicable Psych involvement (Current and /or in the community):     Discharge Needs  Concerns to be addressed:  Care Coordination Readmission within the last 30 days:  Yes Current discharge risk:  Physical Impairment, Chronically ill Barriers to Discharge:  Continued Medical Work up   [employer], LCSW 05/03/2015, 5:08 PM

## 2015-05-03 NOTE — Clinical Social Work Placement (Signed)
   CLINICAL SOCIAL WORK PLACEMENT  NOTE  Date:  05/03/2015  Patient Details  Name: Samuel [name] MRN: [id number] Date of Birth: 1955/08/26  Clinical Social Work is seeking post-discharge placement for this patient at the [place] level of care (*CSW will initial, date and re-position this form in  chart as items are completed):  Yes   Patient/family provided with [place] Work Department's list of facilities offering this level of care within the geographic area requested by the patient (or if unable, by the patient's family).  Yes   Patient/family informed of their freedom to choose among providers that offer the needed level of care, that participate in Medicare, Medicaid or managed care program needed by the patient, have an available bed and are willing to accept the patient.  Yes   Patient/family informed of [place]'s ownership interest in [facility] and [facility], as well as of the fact that they are under no obligation to receive care at these facilities.  PASRR submitted to EDS on       PASRR number received on       Existing PASRR number confirmed on 05/03/15 (good until 05/22/15)     FL2 transmitted to all facilities in geographic area requested by pt/family on       FL2 transmitted to all facilities within larger geographic area on       Patient informed that his/her managed care company has contracts with or will negotiate with certain facilities, including the following:            Patient/family informed of bed offers received.  Patient chooses bed at       Physician recommends and patient chooses bed at      Patient to be transferred to   on  .  Patient to be transferred to facility by       Patient family notified on   of transfer.  Name of family member notified:        PHYSICIAN Please sign FL2, Please sign DNR     Additional Comment:    _______________________________________________ Cranford Mon, LCSW 05/03/2015, 5:14 PM

## 2015-05-03 NOTE — Progress Notes (Signed)
Pt c/o ABD discomfort has not had BM from colostomy in 3 days. Pt says he has not been eating much. Says he did not not get relief from IV phenergan given Compazine 10 mg IV as ordered. Am meds held until pt can get some relief from nausea.

## 2015-05-04 DIAGNOSIS — R1310 Dysphagia, unspecified: Secondary | ICD-10-CM | POA: Insufficient documentation

## 2015-05-04 DIAGNOSIS — I10 Essential (primary) hypertension: Secondary | ICD-10-CM

## 2015-05-04 LAB — BASIC METABOLIC PANEL
Anion gap: 10 (ref 5–15)
BUN: 5 mg/dL — AB (ref 6–20)
CHLORIDE: 100 mmol/L — AB (ref 101–111)
CO2: 22 mmol/L (ref 22–32)
CREATININE: 0.78 mg/dL (ref 0.61–1.24)
Calcium: 7.8 mg/dL — ABNORMAL LOW (ref 8.9–10.3)
GFR calc Af Amer: >60 mL/min (ref >60)
GFR calc non Af Amer: >60 mL/min (ref >60)
GLUCOSE: 94 mg/dL (ref 65–99)
POTASSIUM: 2.8 mmol/L — AB (ref 3.5–5.1)
SODIUM: 132 mmol/L — AB (ref 135–145)

## 2015-05-04 LAB — CBC
HEMATOCRIT: 25 % — AB (ref 39.0–52.0)
Hemoglobin: 8.2 g/dL — ABNORMAL LOW (ref 13.0–17.0)
MCH: 28.1 pg (ref 26.0–34.0)
MCHC: 32.8 g/dL (ref 30.0–36.0)
MCV: 85.6 fL (ref 78.0–100.0)
PLATELETS: 289 10*3/uL (ref 150–400)
RBC: 2.92 MIL/uL — ABNORMAL LOW (ref 4.22–5.81)
RDW: 15.9 % — AB (ref 11.5–15.5)
WBC: 7.6 10*3/uL (ref 4.0–10.5)

## 2015-05-04 MED ORDER — ONDANSETRON HCL 4 MG/2ML IJ SOLN
8.0000 mg | INTRAMUSCULAR | Status: DC | PRN
  Administered 2015-05-04 – 2015-05-07 (×10): 8 mg via INTRAVENOUS
  Filled 2015-05-04 (×10): qty 4

## 2015-05-04 MED ORDER — HYDROMORPHONE HCL 2 MG PO TABS
2.0000 mg | ORAL_TABLET | ORAL | Status: DC | PRN
  Administered 2015-05-04 – 2015-05-07 (×12): 4 mg via ORAL
  Filled 2015-05-04 (×14): qty 2

## 2015-05-04 MED ORDER — POLYVINYL ALCOHOL 1.4 % OP SOLN
1.0000 [drp] | OPHTHALMIC | Status: DC | PRN
  Filled 2015-05-04: qty 15

## 2015-05-04 MED ORDER — POLYETHYLENE GLYCOL 3350 17 G PO PACK
17.0000 g | PACK | Freq: Two times a day (BID) | ORAL | Status: DC
  Administered 2015-05-04 – 2015-05-07 (×4): 17 g via ORAL
  Filled 2015-05-04 (×7): qty 1

## 2015-05-04 MED ORDER — AMLODIPINE BESYLATE 5 MG PO TABS
5.0000 mg | ORAL_TABLET | Freq: Every day | ORAL | Status: DC
  Administered 2015-05-04: 5 mg via ORAL
  Filled 2015-05-04: qty 1

## 2015-05-04 MED ORDER — SENNOSIDES-DOCUSATE SODIUM 8.6-50 MG PO TABS
1.0000 | ORAL_TABLET | Freq: Every day | ORAL | Status: DC
Start: 2015-05-04 — End: 2015-05-07
  Administered 2015-05-04 – 2015-05-06 (×2): 1 via ORAL
  Filled 2015-05-04 (×3): qty 1

## 2015-05-04 MED ORDER — FENTANYL 50 MCG/HR TD PT72
50.0000 ug | MEDICATED_PATCH | TRANSDERMAL | Status: DC
  Administered 2015-05-04 – 2015-05-06 (×2): 50 ug via TRANSDERMAL
  Filled 2015-05-04: qty 1
  Filled 2015-05-04 (×2): qty 2

## 2015-05-04 MED ORDER — POTASSIUM CHLORIDE CRYS ER 20 MEQ PO TBCR
40.0000 meq | EXTENDED_RELEASE_TABLET | ORAL | Status: AC
  Administered 2015-05-04 (×3): 40 meq via ORAL
  Filled 2015-05-04 (×3): qty 2

## 2015-05-04 MED ORDER — METOPROLOL TARTRATE 25 MG PO TABS
25.0000 mg | ORAL_TABLET | Freq: Two times a day (BID) | ORAL | Status: DC
  Administered 2015-05-04 (×2): 25 mg via ORAL
  Filled 2015-05-04 (×2): qty 1

## 2015-05-04 NOTE — Assessment & Plan Note (Signed)
Chest x-ray significant for pneumonia and right upper lobe -Treated with broad-spectrum IV antibiotics initially vancomycin and Primaxin, vancomycin has been stopped, continued on Primaxin day of discharge during hospital stay, afebrile, leukocytosis, no hypoxia, will be discharged on oral antibiotics to finish another 4 days on oral Augmentin. - Cultures no growth to date SNF - cont Augmentin po 4 more days

## 2015-05-04 NOTE — Assessment & Plan Note (Signed)
Likely due to volume depletion due to distaste for thickened liquids , with IV fluid, sodium is 134 on discharge SNF - follow with BMP

## 2015-05-04 NOTE — Assessment & Plan Note (Signed)
SNF - lamictal , also for seizures, and klonopin; very difficult to control with everything else going on

## 2015-05-04 NOTE — Assessment & Plan Note (Signed)
patient and his daughter agree that he wishes to be DO NOT RESUSCITATE and place a priority on comfort

## 2015-05-04 NOTE — Assessment & Plan Note (Signed)
2/2 aspiration PNA ;Chest x-ray significant for pneumonia and right upper lobe -Treated with broad-spectrum IV antibiotics initially vancomycin and Primaxin, vancomycin has been stopped, continued on Primaxin day of discharge during hospital stay, afebrile, leukocytosis, no hypoxia, will be discharged on oral antibiotics to finish another 4 days on oral Augmentin. - Cultures no growth to date SNF - cont augmentin for 4 more days

## 2015-05-04 NOTE — Assessment & Plan Note (Signed)
Tx with same drugs as for PNA, see above

## 2015-05-04 NOTE — Progress Notes (Signed)
[facility] PROGRESS NOTE  Samuel [name] IEP:[id number] DOB: [birth year] DOA: 05/01/2015 PCP: [name], MD  Assessment/Plan: #1 sepsis secondary to probable UTI and HCAP/aspiration pneumonia Patient currently nauseous with some emesis. Patient is afebrile. WBC is normalized. Patient has been pancultured cultures are pending. Continue empiric IV Primaxin. Discontinue IV vancomycin. Aspiration precautions. Currently on a dysphagia 2 diet. Will have speech reevaluate, as patient is asking to be off thickened liquids. Follow.  #2 HCAP versus aspiration pneumonia Per chest x-ray. Patient had presented with fever, chills, tachycardia. Patient with nausea this morning. Patient is afebrile. Sputum is pending. Patient asking whether he can be taken off of nectar thick liquids. Will have speech reevaluate. Continue empiric IV Primaxin. DC IV vancomycin. Follow.  #3 UTI vs bacteruria Urine cultures with no growth to date. Continue empiric IV Primaxin.  #4 urothelial cancer Patient is currently a DO NOT RESUSCITATE CODE STATUS. Per last admission focus was supposed to be primary on comfort. Palliative care consultation following. Follow.  #5 hypertension Blood pressure elevated this morning. Saline lock IV fluids. Resume home dose oral Lopressor and Norvasc.  #6 nausea and emesis Questionable etiology. May be secondary to constipation. Patient would bowel movement after enema yesterday. Patient on a bowel regimen. No further emesis. Continue Compazine as needed. Add Zofran as needed. Supportive care.  #7 hypokalemia Replete.  #8 anemia H&H stable. No overt bleeding. Continue oral iron tablets. Follow.  #9 chronic pain Patient with complaints of pain. Increase fentanyl patch to 50 g every 72 hours. Resume home regimen of oral dilaudid. Palliative care following.  #10 prophylaxis PPI for GI prophylaxis. Heparin for DVT prophylaxis.   Code Status: DO NOT RESUSCITATE Family  Communication: Updated patient. No family present. Disposition Plan: Transfer to [place].   Consultants:  Palliative care: Dr. Rowe Pavy 05/03/2015  Procedures:  Chest x-ray 05/01/2015  Antibiotics:  IV Primaxin 05/02/2015  IV vancomycin 05/02/2015  HPI/Subjective: Patient with nausea this morning but improved since yesterday. Constipation improves after enema yesterday. No emesis. Patient complaining of pain in asking whether he can get any pain medication now.  Objective: Filed Vitals:   05/04/15 0734  BP: 182/88  Pulse: 100  Temp: 98.7 F (37.1 C)  Resp: 31    Intake/Output Summary (Last 24 hours) at 05/04/15 1026 Last data filed at 05/04/15 0837  Gross per 24 hour  Intake 4808.75 ml  Output   6470 ml  Net -1661.25 ml   Filed Weights   05/02/15 0225  Weight: 92.987 kg (205 lb)    Exam:   General:  NAD  Cardiovascular: Tachycardia  Respiratory: CTAB   Abdomen:  soft, nontender, nondistended, positive bowel sounds. Colostomy  intact with stool noted. Urostomy tubes intact and draining urine   Musculoskeletal:  no clubbing cyanosis or edema.  Data Reviewed: Basic Metabolic Panel:  Recent Labs Lab 05/02/15 0025 05/04/15 0334  NA 134* 132*  K 3.4* 2.8*  CL 103 100*  CO2 22 22  GLUCOSE 110* 94  BUN 8 5*  CREATININE 0.99 0.78  CALCIUM 8.1* 7.8*   Liver Function Tests:  Recent Labs Lab 05/02/15 0025  AST 16  ALT 8*  ALKPHOS 78  BILITOT 0.5  PROT 6.6  ALBUMIN 2.0*    Recent Labs Lab 05/02/15 0025  LIPASE 13*   No results for input(s): AMMONIA in the last 168 hours. CBC:  Recent Labs Lab 05/02/15 0025 05/04/15 0334  WBC 9.2 7.6  NEUTROABS 6.7  --   HGB 8.0* 8.2*  HCT 25.9* 25.0*  MCV 87.8 85.6  PLT 322 289   Cardiac Enzymes: No results for input(s): CKTOTAL, CKMB, CKMBINDEX, TROPONINI in the last 168 hours. BNP (last 3 results)  Recent Labs  09/17/14 1651  BNP 41.0    ProBNP (last 3 results) No results for  input(s): PROBNP in the last 8760 hours.  CBG: No results for input(s): GLUCAP in the last 168 hours.  Recent Results (from the past 240 hour(s))  Culture, Urine     Status: None   Collection Time: 04/26/15  1:17 AM  Result Value Ref Range Status   Specimen Description URINE, CATHETERIZED  Final   Special Requests NONE  Final   Culture NO GROWTH 1 DAY  Final   Report Status 04/27/2015 FINAL  Final  Urine culture     Status: None   Collection Time: 05/02/15 12:05 AM  Result Value Ref Range Status   Specimen Description URINE, CLEAN CATCH  Final   Special Requests NONE  Final   Culture MULTIPLE SPECIES PRESENT, SUGGEST RECOLLECTION  Final   Report Status 05/03/2015 FINAL  Final  Blood Culture (routine x 2)     Status: None (Preliminary result)   Collection Time: 05/02/15 12:25 AM  Result Value Ref Range Status   Specimen Description BLOOD LEFT ARM  Final   Special Requests BOTTLES DRAWN AEROBIC AND ANAEROBIC 5ML  Final   Culture NO GROWTH 1 DAY  Final   Report Status PENDING  Incomplete  Blood Culture (routine x 2)     Status: None (Preliminary result)   Collection Time: 05/02/15 12:37 AM  Result Value Ref Range Status   Specimen Description BLOOD LEFT ARM  Final   Special Requests BOTTLES DRAWN AEROBIC AND ANAEROBIC 5ML  Final   Culture NO GROWTH 1 DAY  Final   Report Status PENDING  Incomplete  Urine culture     Status: None   Collection Time: 05/02/15  2:05 AM  Result Value Ref Range Status   Specimen Description URINE, RANDOM RIGHT NEPH BAG  Final   Special Requests NONE  Final   Culture NO GROWTH 1 DAY  Final   Report Status 05/03/2015 FINAL  Final  Urine culture     Status: None   Collection Time: 05/02/15  2:07 AM  Result Value Ref Range Status   Specimen Description URINE, RANDOM LEFT NEPH BAG  Final   Special Requests NONE  Final   Culture NO GROWTH 1 DAY  Final   Report Status 05/03/2015 FINAL  Final  MRSA PCR Screening     Status: None   Collection Time:  05/02/15  3:09 AM  Result Value Ref Range Status   MRSA by PCR NEGATIVE NEGATIVE Final    Comment:        The GeneXpert MRSA Assay (FDA approved for NASAL specimens only), is one component of a comprehensive MRSA colonization surveillance program. It is not intended to diagnose MRSA infection nor to guide or monitor treatment for MRSA infections.      Studies: Dg Abd Portable 1v  05/03/2015   CLINICAL DATA:  Vomiting.  EXAM: PORTABLE ABDOMEN - 1 VIEW  COMPARISON:  CT scan 04/17/2015  FINDINGS: Bilateral nephrostomy tubes are again demonstrated. There is moderate stool throughout the colon which may suggest constipation. No findings for small bowel obstruction or free air. Left lower quadrant colostomy is noted.  IMPRESSION: Moderate stool throughout the colon suggesting constipation.  No findings for small bowel obstruction or free air.  Electronically Signed   By: Marijo Sanes M.D.   On: 05/03/2015 10:59    Scheduled Meds: . amLODipine  5 mg Oral Daily  . atorvastatin  10 mg Oral q1800  . fentaNYL  25 mcg Transdermal Q72H  . ferrous sulfate  325 mg Oral BID WC  . folic acid  1 mg Oral Daily  . heparin  5,000 Units Subcutaneous 3 times per day  . imipenem-cilastatin  500 mg Intravenous 4 times per day  . lamoTRIgine  200 mg Oral BID  . LORazepam  0.5 mg Oral BID  . metoCLOPramide  5 mg Oral TID AC  . metoprolol tartrate  25 mg Oral BID  . pantoprazole  40 mg Oral Daily  . polyethylene glycol  17 g Oral BID  . potassium chloride  40 mEq Oral Q4H  . senna-docusate  1 tablet Oral QHS  . vancomycin  1,250 mg Intravenous Q12H   Continuous Infusions: . sodium chloride 75 mL/hr at 05/04/15 0827    Principal Problem:   Sepsis ([place]) Active Problems:   Hypokalemia   Chronic pain   Dyslipidemia   HTN (hypertension)   HCAP (healthcare-associated pneumonia)   Urothelial cancer ([place])   Anemia   Infection due to ESBL-producing Klebsiella pneumoniae   Aspiration pneumonia  (HCC)   UTI (lower urinary tract infection)   Emesis   Encounter for palliative care    Time spent: 24 minutes    [facility] M.D. [facility] Pager [phone]. If 7PM-7AM, please contact night-coverage at [email or web address], password [facility] 05/04/2015, 10:26 AM  LOS: 2 days

## 2015-05-04 NOTE — Assessment & Plan Note (Signed)
Diet has been liberalized given his no CODE BLUE status and desire to focus on comfort -SNF - follow on thickened liquids for tolerance

## 2015-05-04 NOTE — Assessment & Plan Note (Signed)
EEG and MRI neg last admit

## 2015-05-04 NOTE — Assessment & Plan Note (Signed)
status PC nephrostomy drain placement 04/07/15 - no clinical reason to expect recurrence - nephrostomy drainage adequate/unremarkable

## 2015-05-04 NOTE — Assessment & Plan Note (Signed)
INR is trending down, but appears to be chronically elevated, was on warfarin in the past due to history of PE, has been stopped due to bleeding at the nephrostomy site. INR is 1.54 on discharge

## 2015-05-04 NOTE — Assessment & Plan Note (Signed)
intermediate resistance to Zosyn and sensitive only to imipenem - was on imipenem from 9/8 until 9/19 -PT cultures this hospital stay with no growth

## 2015-05-04 NOTE — Evaluation (Signed)
Physical Therapy Evaluation Patient Details Name: Samuel [name] MRN: [id number] DOB: 1955/09/09 Today's Date: 05/04/2015   History of Present Illness  59 y.o. male who presented to the Emergency Department complaining of left sided "body pain" that started the day prior. He is currently has sepsis secondary to probable UTI along with urothelial cancer. PMH: chronic pain, Lt AKA, SCI (incomplete), bipolar, seizures, Hepatitis C, Colostomy, Rt hip fracture.   Clinical Impression  Patient seen for initial evaluation of mobility and level of function (current and prior). At this time the patient has refused to attempt any sitting at the edge of bed and was only willing to attempt sitting up in the bed. He reports that in the past he was able to perform a lateral transfer to a wheelchair but has not done this in several months. Patient is planning to return to a SNF upon D/C. Will continue with PT to improve the patients mobility and function for improved independence.     Follow Up Recommendations SNF    Equipment Recommendations  None recommended by PT    Recommendations for Other Services       Precautions / Restrictions Precautions Precautions: Fall Restrictions Weight Bearing Restrictions: No      Mobility  Bed Mobility Overal bed mobility: Needs Assistance Bed Mobility:  (sitting up in bed)           General bed mobility comments: Patient able to use bilateral UEs to pull self up into sitting while in bed. Patient using bilateral lower bed rails to assist. Repeating X2. Patient refusing to attempt to sit at edge of bed stating that he just really does not want to right now. Encouragement provided and patient continuing to refuse.   Transfers                 General transfer comment: Not assessed  Ambulation/Gait                Stairs            Wheelchair Mobility    Modified Rankin (Stroke Patients Only)       Balance                                             Pertinent Vitals/Pain Pain Assessment: 0-10 Pain Score: 8  Pain Location: Lt leg at amputation Pain Intervention(s): RN gave pain meds during session    Home Living Family/patient expects to be discharged to:: [place]: Other (Comment) (coming from SNF)                    Prior Function Level of Independence: Needs assistance   Gait / Transfers Assistance Needed: reports staying in bed at prior SNF. In the past he has used a trapeze to scoot to his wheelchair but has not done this for several months           Hand Dominance        Extremity/Trunk Assessment   Upper Extremity Assessment: Generalized weakness                     Communication   Communication: No difficulties  Cognition Arousal/Alertness: Awake/alert Behavior During Therapy: WFL for tasks assessed/performed;Agitated Overall Cognitive Status: Within Functional Limits for tasks assessed  General Comments General comments (skin integrity, edema, etc.): Patient willing to perform sitting in bed but refusing to attempt sitting at edge of bed.     Exercises        Assessment/Plan    PT Assessment Patient needs continued PT services  PT Diagnosis Generalized weakness   PT Problem List Decreased strength;Decreased activity tolerance;Decreased mobility  PT Treatment Interventions Functional mobility training;Therapeutic activities;Therapeutic exercise;Patient/family education   PT Goals (Current goals can be found in the Care Plan section) Acute Rehab PT Goals Patient Stated Goal: be able to use my wheelchair again PT Goal Formulation: With patient Time For Goal Achievement: 05/18/15 Potential to Achieve Goals: Fair    Frequency Min 2X/week   Barriers to discharge        Co-evaluation               End of Session   Activity Tolerance: No increased pain;Other (comment)  (limited mobility per patient refusal) Patient left: in bed;with call bell/phone within reach Nurse Communication: Mobility status;Other (comment) (recommended trapeze for patient)         Time: 4166-0630 PT Time Calculation (min) (ACUTE ONLY): 19 min   Charges:   PT Evaluation $Initial PT Evaluation Tier I: 1 Procedure     PT G Codes:        Samuel [name], PT, CSCS Pager [phone] Office [phone]  05/04/2015, 2:33 PM

## 2015-05-04 NOTE — Assessment & Plan Note (Signed)
SNF - controlled on metoprolol and norvasc

## 2015-05-04 NOTE — Assessment & Plan Note (Signed)
SNF - will be present until end of life; continue wound care
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
## 2015-05-04 NOTE — Assessment & Plan Note (Signed)
SNF - cont iron, folate and B12; will recheck CBC

## 2015-05-04 NOTE — Progress Notes (Signed)
CSW met with patient and his daughter [name] this afternoon to discuss patient's request for further bed search. Only bed offer was from [facility]. Daughter stated that he has been there in the past and requested to move from this facility.  He has been in multiple facilities in the past and daughter insists that he cannot continue to move around. She wants him to return to [place] and patient agrees.  Daughter stated that a bed search could be completed closer to where she lives and agreed to search in [place], [place] and [place]. CSW will initiate these additional services- however- explained that should patient be stable over the weekend for d/c then plan would have to be for dc back to [facility] and they could pursue seeking another facility in the future.  Patient and daughter verbalized understanding of same. Awaiting MD's decision re: stability. Patient moved today from 2 C to 6 N.   Patient requested information about an Scientist, physiological and HCPOA. He stated that he has 2 daughters and would like either to be his 31.  He also is willing to complete the Advance Directive.  CSW reviewed the forms with patient and daughter [name] and encouraged them to read the document later this evening.  Referral completed with Quita Skye- on call Chaplain with request for Chaplain services to complete documents over the weekend.  Handoff provided to weekend CSW coverage who will monitor for possible date of dc. Samuel [name]. Samuel [name], [place]

## 2015-05-04 NOTE — Assessment & Plan Note (Addendum)
Was seen by palliative care, patient was started on fentanyl patch, they recommended discharge on oral Dilaudid 2-4 mg every 4 hours as needed. SNF - although pt has a real reason for pain, he is an addict and is actively seeking; Pallative care MD has been and is working with him so he doesn't  get enough to accidentally OD on spite demands for more

## 2015-05-05 DIAGNOSIS — R8271 Bacteriuria: Secondary | ICD-10-CM | POA: Insufficient documentation

## 2015-05-05 LAB — CBC
HCT: 25.2 % — ABNORMAL LOW (ref 39.0–52.0)
Hemoglobin: 8.3 g/dL — ABNORMAL LOW (ref 13.0–17.0)
MCH: 28.4 pg (ref 26.0–34.0)
MCHC: 32.9 g/dL (ref 30.0–36.0)
MCV: 86.3 fL (ref 78.0–100.0)
PLATELETS: 327 10*3/uL (ref 150–400)
RBC: 2.92 MIL/uL — AB (ref 4.22–5.81)
RDW: 16 % — ABNORMAL HIGH (ref 11.5–15.5)
WBC: 9.7 10*3/uL (ref 4.0–10.5)

## 2015-05-05 LAB — BASIC METABOLIC PANEL
Anion gap: 10 (ref 5–15)
CO2: 23 mmol/L (ref 22–32)
Calcium: 8.3 mg/dL — ABNORMAL LOW (ref 8.9–10.3)
Chloride: 103 mmol/L (ref 101–111)
Creatinine, Ser: 0.82 mg/dL (ref 0.61–1.24)
GFR calc Af Amer: >60 mL/min (ref >60)
GLUCOSE: 93 mg/dL (ref 65–99)
POTASSIUM: 3.9 mmol/L (ref 3.5–5.1)
Sodium: 136 mmol/L (ref 135–145)

## 2015-05-05 LAB — MAGNESIUM: Magnesium: 1.1 mg/dL — ABNORMAL LOW (ref 1.7–2.4)

## 2015-05-05 MED ORDER — METOPROLOL TARTRATE 50 MG PO TABS
50.0000 mg | ORAL_TABLET | Freq: Two times a day (BID) | ORAL | Status: DC
  Administered 2015-05-05 – 2015-05-07 (×5): 50 mg via ORAL
  Filled 2015-05-05 (×5): qty 1

## 2015-05-05 MED ORDER — SODIUM CHLORIDE 0.9 % IJ SOLN
10.0000 mL | INTRAMUSCULAR | Status: DC | PRN
  Administered 2015-05-06: 20 mL
  Administered 2015-05-07: 10 mL
  Filled 2015-05-05 (×2): qty 40

## 2015-05-05 MED ORDER — MAGNESIUM SULFATE 4 GM/100ML IV SOLN
4.0000 g | Freq: Once | INTRAVENOUS | Status: AC
  Administered 2015-05-05: 4 g via INTRAVENOUS
  Filled 2015-05-05: qty 100

## 2015-05-05 MED ORDER — SODIUM CHLORIDE 0.9 % IJ SOLN
10.0000 mL | INTRAMUSCULAR | Status: DC | PRN
  Administered 2015-05-05 – 2015-05-07 (×2): 10 mL
  Filled 2015-05-05: qty 40
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
## 2015-05-05 NOTE — Progress Notes (Signed)
[facility] PROGRESS NOTE  Samuel [name] PFX:[id number] DOB: Sep 26, 1955 DOA: 05/01/2015 PCP: [name], MD  Assessment/Plan: #1 sepsis secondary to probable UTI and HCAP/aspiration pneumonia Patient currently less nauseous. No emesis. Patient is afebrile. WBC is normalized. Patient has been pancultured cultures are pending. Continue empiric IV Primaxin. Discontinued IV vancomycin. Aspiration precautions. Currently on a regular diet. Follow.  #2 HCAP versus aspiration pneumonia Per chest x-ray. Patient had presented with fever, chills, tachycardia. Patient with improvement with nausea. Patient is afebrile. Sputum is pending. Patient has been seen by SLP, and no signs of aspiration per SLP. Patient understands risks of aspiration and accepts them. Continue current regular diet, with thin liquids. Aspiration precautions.  #3 Bacteruria Urine cultures with no growth to date. Continue empiric IV Primaxin.  #4 urothelial cancer Patient is currently a DO NOT RESUSCITATE CODE STATUS. Per last admission focus was supposed to be primary on comfort. Palliative care consultation following. Follow.  #5 hypertension Blood pressure improving. D/C norvasc. Increase metoprolol to 50mg  BID.  #6 nausea and emesis Questionable etiology. Likely secondary to constipation. Patient with bowel movement after enema, and now with good BM. Nausea and emesis improved. Continue current bowel regimen. No further emesis. Continue Compazine and Zofran as needed. Supportive care.  #7 hypokalemia/Hypomagnesemia Repleted.Replete magnesium.  #8 anemia H&H stable. No overt bleeding. Continue oral iron tablets. Follow.  #9 chronic pain Patient with complaints of pain. Increased fentanyl patch to 50 g every 72 hours yesterday. Continue home regimen of oral dilaudid for breakthrough pain. Palliative care following.  #10 prophylaxis PPI for GI prophylaxis. Heparin for DVT prophylaxis.   Code Status:  DO NOT RESUSCITATE Family Communication: Updated patient. No family present. Disposition Plan: Hopefully to SNF on Monday.   Consultants:  Palliative care: Dr. Rowe Pavy 05/03/2015  Procedures:  Chest x-ray 05/01/2015  Antibiotics:  IV Primaxin 05/02/2015  IV vancomycin 05/02/2015>>>>>05/04/2015  HPI/Subjective: Patient feeling better. Nausea improving. Tolerating current diet. No SOB. No CP. Patient states pain better controlled.  Objective: Filed Vitals:   05/05/15 0451  BP: 144/80  Pulse: 102  Temp: 98.8 F (37.1 C)  Resp: 20    Intake/Output Summary (Last 24 hours) at 05/05/15 1456 Last data filed at 05/05/15 1401  Gross per 24 hour  Intake   1220 ml  Output   2350 ml  Net  -1130 ml   Filed Weights   05/02/15 0225  Weight: 92.987 kg (205 lb)    Exam:   General:  NAD  Cardiovascular: RRR  Respiratory: Minimal crackles in bases. No wheezing  Abdomen:  soft, nontender, nondistended, positive bowel sounds. Colostomy  intact with stool. Urostomy tubes intact and draining urine   Musculoskeletal:  no clubbing cyanosis or edema.s/p L AKA.  Data Reviewed: Basic Metabolic Panel:  Recent Labs Lab 05/02/15 0025 05/04/15 0334 05/05/15 0411  NA 134* 132* 136  K 3.4* 2.8* 3.9  CL 103 100* 103  CO2 22 22 23   GLUCOSE 110* 94 93  BUN 8 5* <5*  CREATININE 0.99 0.78 0.82  CALCIUM 8.1* 7.8* 8.3*  MG  --   --  1.1*   Liver Function Tests:  Recent Labs Lab 05/02/15 0025  AST 16  ALT 8*  ALKPHOS 78  BILITOT 0.5  PROT 6.6  ALBUMIN 2.0*    Recent Labs Lab 05/02/15 0025  LIPASE 13*   No results for input(s): AMMONIA in the last 168 hours. CBC:  Recent Labs Lab 05/02/15 0025 05/04/15 0334 05/05/15 0411  WBC 9.2 7.6  9.7  NEUTROABS 6.7  --   --   HGB 8.0* 8.2* 8.3*  HCT 25.9* 25.0* 25.2*  MCV 87.8 85.6 86.3  PLT 322 289 327   Cardiac Enzymes: No results for input(s): CKTOTAL, CKMB, CKMBINDEX, TROPONINI in the last 168 hours. BNP (last 3  results)  Recent Labs  09/17/14 1651  BNP 41.0    ProBNP (last 3 results) No results for input(s): PROBNP in the last 8760 hours.  CBG: No results for input(s): GLUCAP in the last 168 hours.  Recent Results (from the past 240 hour(s))  Culture, Urine     Status: None   Collection Time: 04/26/15  1:17 AM  Result Value Ref Range Status   Specimen Description URINE, CATHETERIZED  Final   Special Requests NONE  Final   Culture NO GROWTH 1 DAY  Final   Report Status 04/27/2015 FINAL  Final  Urine culture     Status: None   Collection Time: 05/02/15 12:05 AM  Result Value Ref Range Status   Specimen Description URINE, CLEAN CATCH  Final   Special Requests NONE  Final   Culture MULTIPLE SPECIES PRESENT, SUGGEST RECOLLECTION  Final   Report Status 05/03/2015 FINAL  Final  Blood Culture (routine x 2)     Status: None (Preliminary result)   Collection Time: 05/02/15 12:25 AM  Result Value Ref Range Status   Specimen Description BLOOD LEFT ARM  Final   Special Requests BOTTLES DRAWN AEROBIC AND ANAEROBIC 5ML  Final   Culture NO GROWTH 3 DAYS  Final   Report Status PENDING  Incomplete  Blood Culture (routine x 2)     Status: None (Preliminary result)   Collection Time: 05/02/15 12:37 AM  Result Value Ref Range Status   Specimen Description BLOOD LEFT ARM  Final   Special Requests BOTTLES DRAWN AEROBIC AND ANAEROBIC 5ML  Final   Culture NO GROWTH 3 DAYS  Final   Report Status PENDING  Incomplete  Urine culture     Status: None   Collection Time: 05/02/15  2:05 AM  Result Value Ref Range Status   Specimen Description URINE, RANDOM RIGHT NEPH BAG  Final   Special Requests NONE  Final   Culture NO GROWTH 1 DAY  Final   Report Status 05/03/2015 FINAL  Final  Urine culture     Status: None   Collection Time: 05/02/15  2:07 AM  Result Value Ref Range Status   Specimen Description URINE, RANDOM LEFT NEPH BAG  Final   Special Requests NONE  Final   Culture NO GROWTH 1 DAY  Final    Report Status 05/03/2015 FINAL  Final  MRSA PCR Screening     Status: None   Collection Time: 05/02/15  3:09 AM  Result Value Ref Range Status   MRSA by PCR NEGATIVE NEGATIVE Final    Comment:        The GeneXpert MRSA Assay (FDA approved for NASAL specimens only), is one component of a comprehensive MRSA colonization surveillance program. It is not intended to diagnose MRSA infection nor to guide or monitor treatment for MRSA infections.      Studies: No results found.  Scheduled Meds: . atorvastatin  10 mg Oral q1800  . fentaNYL  50 mcg Transdermal Q72H  . ferrous sulfate  325 mg Oral BID WC  . folic acid  1 mg Oral Daily  . heparin  5,000 Units Subcutaneous 3 times per day  . imipenem-cilastatin  500 mg Intravenous 4 times  per day  . lamoTRIgine  200 mg Oral BID  . LORazepam  0.5 mg Oral BID  . metoCLOPramide  5 mg Oral TID AC  . metoprolol tartrate  50 mg Oral BID  . pantoprazole  40 mg Oral Daily  . polyethylene glycol  17 g Oral BID  . senna-docusate  1 tablet Oral QHS   Continuous Infusions:    Principal Problem:   Sepsis ([place]) Active Problems:   Hypokalemia   Chronic pain   Dyslipidemia   HTN (hypertension)   HCAP (healthcare-associated pneumonia)   Urothelial cancer (HCC)   Antineoplastic chemotherapy induced anemia   Infection due to ESBL-producing Klebsiella pneumoniae   Aspiration pneumonia (HCC)   UTI (lower urinary tract infection)   Emesis   Encounter for palliative care    Time spent: 67 minutes    [facility] M.D. [facility] Pager [phone]. If 7PM-7AM, please contact night-coverage at [email or web address], password [facility] 05/05/2015, 2:56 PM  LOS: 3 days

## 2015-05-06 LAB — CBC
HCT: 25.2 % — ABNORMAL LOW (ref 39.0–52.0)
HEMOGLOBIN: 8.3 g/dL — AB (ref 13.0–17.0)
MCH: 28.4 pg (ref 26.0–34.0)
MCHC: 32.9 g/dL (ref 30.0–36.0)
MCV: 86.3 fL (ref 78.0–100.0)
PLATELETS: 285 10*3/uL (ref 150–400)
RBC: 2.92 MIL/uL — AB (ref 4.22–5.81)
RDW: 16.1 % — ABNORMAL HIGH (ref 11.5–15.5)
WBC: 10 10*3/uL (ref 4.0–10.5)

## 2015-05-06 LAB — BASIC METABOLIC PANEL
ANION GAP: 11 (ref 5–15)
BUN: <5 mg/dL — ABNORMAL LOW (ref 6–20)
CHLORIDE: 98 mmol/L — AB (ref 101–111)
CO2: 24 mmol/L (ref 22–32)
Calcium: 8.3 mg/dL — ABNORMAL LOW (ref 8.9–10.3)
Creatinine, Ser: 0.93 mg/dL (ref 0.61–1.24)
Glucose, Bld: 89 mg/dL (ref 65–99)
POTASSIUM: 3.4 mmol/L — AB (ref 3.5–5.1)
SODIUM: 133 mmol/L — AB (ref 135–145)

## 2015-05-06 LAB — MAGNESIUM: MAGNESIUM: 1.8 mg/dL (ref 1.7–2.4)

## 2015-05-06 MED ORDER — POTASSIUM CHLORIDE CRYS ER 20 MEQ PO TBCR
40.0000 meq | EXTENDED_RELEASE_TABLET | Freq: Every day | ORAL | Status: DC
  Administered 2015-05-06 – 2015-05-07 (×2): 40 meq via ORAL
  Filled 2015-05-06 (×2): qty 2

## 2015-05-06 MED ORDER — MAGNESIUM SULFATE 2 GM/50ML IV SOLN
2.0000 g | Freq: Once | INTRAVENOUS | Status: AC
  Administered 2015-05-06: 2 g via INTRAVENOUS
  Filled 2015-05-06 (×2): qty 50

## 2015-05-06 NOTE — Progress Notes (Signed)
[facility] PROGRESS NOTE  Samuel [name] LDJ:[id number] DOB: 1955/09/22 DOA: 05/01/2015 PCP: [name], MD  Assessment/Plan: #1 sepsis secondary to probable UTI and HCAP/aspiration pneumonia Patient currently less nauseous. No emesis. Patient is afebrile. WBC is normalized. Patient has been pancultured cultures are pending. Continue empiric IV Primaxin. Discontinued IV vancomycin. Aspiration precautions. Currently on a regular diet. Follow.  #2 HCAP versus aspiration pneumonia Per chest x-ray. Patient had presented with fever, chills, tachycardia. Patient with improvement with nausea. Patient is afebrile. Sputum is pending. Patient has been seen by SLP, and no signs of aspiration per SLP. Patient understands risks of aspiration and accepts them. Continue current regular diet, with thin liquids. Aspiration precautions.  #3 Bacteruria Urine cultures with no growth to date. Continue empiric IV Primaxin.  #4 urothelial cancer Patient is currently a DO NOT RESUSCITATE CODE STATUS. Per last admission focus was supposed to be primary on comfort. Palliative care consultation following. Follow.  #5 hypertension Blood pressure improving. Continue metoprolol 50 mg twice a day.  #6 nausea and emesis Questionable etiology. Likely secondary to constipation. Patient with bowel movement after enema, and now with good BM and on good bowel regimen. Nausea and emesis improved. Continue current bowel regimen. No further emesis. Continue Compazine and Zofran as needed. Supportive care.  #7 hypokalemia/Hypomagnesemia Repleted.Replete magnesium.  #8 anemia H&H stable. No overt bleeding. Continue oral iron tablets. Follow.  #9 chronic pain Patient with complaints of pain. Increased fentanyl patch to 50 g every 72 hours yesterday. Continue home regimen of oral dilaudid for breakthrough pain. Palliative care following.  #10 generalized anxiety disorder Continue scheduled Ativan twice a  day and Ativan when necessary.  #11 prophylaxis PPI for GI prophylaxis. Heparin for DVT prophylaxis.   Code Status: DO NOT RESUSCITATE Family Communication: Updated patient. No family present. Disposition Plan: Hopefully to SNF on Monday.   Consultants:  Palliative care: Dr. Rowe Pavy 05/03/2015  Procedures:  Chest x-ray 05/01/2015  Antibiotics:  IV Primaxin 05/02/2015  IV vancomycin 05/02/2015>>>>>05/04/2015  HPI/Subjective: Patient feeling better. Nausea improved. No CP. No SOB. Tolerating current diet.  Objective: Filed Vitals:   05/06/15 0941  BP: 142/71  Pulse: 109  Temp:   Resp:     Intake/Output Summary (Last 24 hours) at 05/06/15 1011 Last data filed at 05/06/15 0800  Gross per 24 hour  Intake   1290 ml  Output   2650 ml  Net  -1360 ml   Filed Weights   05/02/15 0225  Weight: 92.987 kg (205 lb)    Exam:   General:  NAD  Cardiovascular: RRR  Respiratory: CTAB  Abdomen:  soft, nontender, nondistended, positive bowel sounds. Colostomy  intact with stool. Urostomy tubes intact and draining urine   Musculoskeletal:  no clubbing cyanosis or edema.s/p L AKA.  Data Reviewed: Basic Metabolic Panel:  Recent Labs Lab 05/02/15 0025 05/04/15 0334 05/05/15 0411 05/06/15 0540  NA 134* 132* 136 133*  K 3.4* 2.8* 3.9 3.4*  CL 103 100* 103 98*  CO2 22 22 23 24   GLUCOSE 110* 94 93 89  BUN 8 5* <5* <5*  CREATININE 0.99 0.78 0.82 0.93  CALCIUM 8.1* 7.8* 8.3* 8.3*  MG  --   --  1.1* 1.8   Liver Function Tests:  Recent Labs Lab 05/02/15 0025  AST 16  ALT 8*  ALKPHOS 78  BILITOT 0.5  PROT 6.6  ALBUMIN 2.0*    Recent Labs Lab 05/02/15 0025  LIPASE 13*   No results for input(s): AMMONIA in the  last 168 hours. CBC:  Recent Labs Lab 05/02/15 0025 05/04/15 0334 05/05/15 0411 05/06/15 0540  WBC 9.2 7.6 9.7 10.0  NEUTROABS 6.7  --   --   --   HGB 8.0* 8.2* 8.3* 8.3*  HCT 25.9* 25.0* 25.2* 25.2*  MCV 87.8 85.6 86.3 86.3  PLT 322 289  327 285   Cardiac Enzymes: No results for input(s): CKTOTAL, CKMB, CKMBINDEX, TROPONINI in the last 168 hours. BNP (last 3 results)  Recent Labs  09/17/14 1651  BNP 41.0    ProBNP (last 3 results) No results for input(s): PROBNP in the last 8760 hours.  CBG: No results for input(s): GLUCAP in the last 168 hours.  Recent Results (from the past 240 hour(s))  Urine culture     Status: None   Collection Time: 05/02/15 12:05 AM  Result Value Ref Range Status   Specimen Description URINE, CLEAN CATCH  Final   Special Requests NONE  Final   Culture MULTIPLE SPECIES PRESENT, SUGGEST RECOLLECTION  Final   Report Status 05/03/2015 FINAL  Final  Blood Culture (routine x 2)     Status: None (Preliminary result)   Collection Time: 05/02/15 12:25 AM  Result Value Ref Range Status   Specimen Description BLOOD LEFT ARM  Final   Special Requests BOTTLES DRAWN AEROBIC AND ANAEROBIC 5ML  Final   Culture NO GROWTH 3 DAYS  Final   Report Status PENDING  Incomplete  Blood Culture (routine x 2)     Status: None (Preliminary result)   Collection Time: 05/02/15 12:37 AM  Result Value Ref Range Status   Specimen Description BLOOD LEFT ARM  Final   Special Requests BOTTLES DRAWN AEROBIC AND ANAEROBIC 5ML  Final   Culture NO GROWTH 3 DAYS  Final   Report Status PENDING  Incomplete  Urine culture     Status: None   Collection Time: 05/02/15  2:05 AM  Result Value Ref Range Status   Specimen Description URINE, RANDOM RIGHT NEPH BAG  Final   Special Requests NONE  Final   Culture NO GROWTH 1 DAY  Final   Report Status 05/03/2015 FINAL  Final  Urine culture     Status: None   Collection Time: 05/02/15  2:07 AM  Result Value Ref Range Status   Specimen Description URINE, RANDOM LEFT NEPH BAG  Final   Special Requests NONE  Final   Culture NO GROWTH 1 DAY  Final   Report Status 05/03/2015 FINAL  Final  MRSA PCR Screening     Status: None   Collection Time: 05/02/15  3:09 AM  Result Value Ref  Range Status   MRSA by PCR NEGATIVE NEGATIVE Final    Comment:        The GeneXpert MRSA Assay (FDA approved for NASAL specimens only), is one component of a comprehensive MRSA colonization surveillance program. It is not intended to diagnose MRSA infection nor to guide or monitor treatment for MRSA infections.      Studies: No results found.  Scheduled Meds: . atorvastatin  10 mg Oral q1800  . fentaNYL  50 mcg Transdermal Q72H  . ferrous sulfate  325 mg Oral BID WC  . folic acid  1 mg Oral Daily  . heparin  5,000 Units Subcutaneous 3 times per day  . imipenem-cilastatin  500 mg Intravenous 4 times per day  . lamoTRIgine  200 mg Oral BID  . LORazepam  0.5 mg Oral BID  . magnesium sulfate 1 - 4 g bolus  IVPB  2 g Intravenous Once  . metoCLOPramide  5 mg Oral TID AC  . metoprolol tartrate  50 mg Oral BID  . pantoprazole  40 mg Oral Daily  . polyethylene glycol  17 g Oral BID  . potassium chloride  40 mEq Oral Daily  . senna-docusate  1 tablet Oral QHS   Continuous Infusions:    Principal Problem:   Sepsis ([place]) Active Problems:   Hypokalemia   Chronic pain   Dyslipidemia   HTN (hypertension)   HCAP (healthcare-associated pneumonia)   Urothelial cancer (HCC)   Antineoplastic chemotherapy induced anemia   Infection due to ESBL-producing Klebsiella pneumoniae   Aspiration pneumonia (HCC)   UTI (lower urinary tract infection)   Emesis   Encounter for palliative care   Bacteria in urine    Time spent: 77 minutes    THOMPSON,DANIEL M.D. [facility] Pager [phone]. If 7PM-7AM, please contact night-coverage at [email or web address], password [facility] 05/06/2015, 10:11 AM  LOS: 4 days

## 2015-05-07 ENCOUNTER — Encounter: Payer: Self-pay | Admitting: Internal Medicine

## 2015-05-07 LAB — CULTURE, BLOOD (ROUTINE X 2)
Culture: NO GROWTH
Culture: NO GROWTH

## 2015-05-07 LAB — BASIC METABOLIC PANEL
Anion gap: 10 (ref 5–15)
BUN: 5 mg/dL — AB (ref 6–20)
CO2: 23 mmol/L (ref 22–32)
Calcium: 8.7 mg/dL — ABNORMAL LOW (ref 8.9–10.3)
Chloride: 99 mmol/L — ABNORMAL LOW (ref 101–111)
Creatinine, Ser: 1.01 mg/dL (ref 0.61–1.24)
GFR calc Af Amer: >60 mL/min (ref >60)
GLUCOSE: 87 mg/dL (ref 65–99)
POTASSIUM: 4 mmol/L (ref 3.5–5.1)
Sodium: 132 mmol/L — ABNORMAL LOW (ref 135–145)

## 2015-05-07 LAB — MAGNESIUM: Magnesium: 1.7 mg/dL (ref 1.7–2.4)

## 2015-05-07 MED ORDER — LORAZEPAM 0.5 MG PO TABS: 0.5000 mg | ORAL_TABLET | Freq: Three times a day (TID) | ORAL | Status: DC | PRN

## 2015-05-07 MED ORDER — OMEPRAZOLE 40 MG PO CPDR: 40.0000 mg | DELAYED_RELEASE_CAPSULE | Freq: Every day | ORAL | Status: DC

## 2015-05-07 MED ORDER — ZOLPIDEM TARTRATE 5 MG PO TABS: 5.0000 mg | ORAL_TABLET | Freq: Every evening | ORAL | Status: DC | PRN

## 2015-05-07 MED ORDER — HYDROMORPHONE HCL 2 MG PO TABS: 2.0000 mg | ORAL_TABLET | ORAL | Status: DC | PRN

## 2015-05-07 MED ORDER — METOPROLOL TARTRATE 50 MG PO TABS: 50.0000 mg | ORAL_TABLET | Freq: Two times a day (BID) | ORAL | Status: DC

## 2015-05-07 MED ORDER — MAGNESIUM SULFATE 50 % IJ SOLN
3.0000 g | Freq: Once | INTRAVENOUS | Status: AC
  Administered 2015-05-07: 3 g via INTRAVENOUS
  Filled 2015-05-07: qty 6

## 2015-05-07 MED ORDER — LORAZEPAM 0.5 MG PO TABS: 0.5000 mg | ORAL_TABLET | Freq: Two times a day (BID) | ORAL | Status: DC

## 2015-05-07 MED ORDER — SENNOSIDES-DOCUSATE SODIUM 8.6-50 MG PO TABS: 1.0000 | ORAL_TABLET | Freq: Every day | ORAL | Status: DC

## 2015-05-07 MED ORDER — POLYETHYLENE GLYCOL 3350 17 G PO PACK
17.0000 g | PACK | Freq: Two times a day (BID) | ORAL | Status: DC
Start: 2015-05-07 — End: 2015-06-09

## 2015-05-07 MED ORDER — PROCHLORPERAZINE MALEATE 10 MG PO TABS: 10.0000 mg | ORAL_TABLET | Freq: Four times a day (QID) | ORAL | Status: DC | PRN

## 2015-05-07 MED ORDER — FENTANYL 50 MCG/HR TD PT72: 50.0000 ug | MEDICATED_PATCH | TRANSDERMAL | Status: DC

## 2015-05-07 MED ORDER — POTASSIUM CHLORIDE CRYS ER 20 MEQ PO TBCR: 20.0000 meq | EXTENDED_RELEASE_TABLET | Freq: Every day | ORAL | Status: DC

## 2015-05-07 MED ORDER — LEVOFLOXACIN 750 MG PO TABS: 750.0000 mg | ORAL_TABLET | Freq: Every day | ORAL | Status: DC

## 2015-05-07 MED ORDER — HEPARIN SOD (PORK) LOCK FLUSH 100 UNIT/ML IV SOLN
500.0000 [IU] | INTRAVENOUS | Status: AC | PRN
  Administered 2015-05-07: 500 [IU]

## 2015-05-07 MED ORDER — LEVOFLOXACIN 750 MG PO TABS
750.0000 mg | ORAL_TABLET | Freq: Every day | ORAL | Status: DC
Start: 2015-05-07 — End: 2015-05-07

## 2015-05-07 NOTE — Progress Notes (Signed)
OT Discharge Note  Patient Details Name: Samuel [name] MRN: [id number] DOB: 1955/10/26   Cancelled Treatment:    Reason Eval/Treat Not Completed: OT screened,  will sign off (Defer back to [facility]). Spoke with SW Raquel Sarna and Dr Grandville Silos about pending d/c today. Pt refused OOB with PT Abigail Butts this AM.  Vonita Moss   OTR/L Pager: [phone] Office: [phone] .  05/07/2015, 11:15 AM

## 2015-05-07 NOTE — Progress Notes (Signed)
Pt discharged by transport to [facility].  Report called and given to nurse at [facility].  PAC to rt chest de-accessed.  Pt belongings sent with him in transfer.

## 2015-05-07 NOTE — Discharge Planning (Signed)
Patient to be discharged to [facility]. Patient and patient's daughter updated regarding discharge.  Facility: [facility] RN report number: [phone] Transportation: EMS  Custer Pimenta, LCSWA - [phone] Clinical Social Work Department Orthopedics ([id number]) and Surgical ([id number])   

## 2015-05-07 NOTE — Progress Notes (Signed)
This encounter was created in error - please disregard.

## 2015-05-07 NOTE — Progress Notes (Signed)
   05/07/15 1322  Clinical Encounter Type  Visited With Patient;Health care provider  Visit Type Follow-up  Referral From Patient  Spiritual Encounters  Spiritual Needs Sacred text   Chaplain followed up and brought the patient a Bible. Chaplain support available as needed.   Jeri Lager, Chaplain 05/07/2015 1:23 PM
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
## 2015-05-07 NOTE — Progress Notes (Signed)
   05/07/15 1215  Clinical Encounter Type  Visited With Patient;Health care provider  Visit Type Initial  Referral From Nurse;Patient  Spiritual Encounters  Spiritual Needs Sacred text   Chaplain responded to a page/request from the patient who is seeking a Bible. Chaplain will obtain one for him, and follow-up. Chaplain support available as needed.   Jeri Lager, Chaplain 05/07/2015 12:16 PM

## 2015-05-07 NOTE — Progress Notes (Signed)
Physical Therapy Treatment Patient Details Name: Samuel [name] MRN: [id number] DOB: 31-Mar-1956 Today's Date: 05/07/2015    History of Present Illness 59 y.o. male who presented to the Emergency Department complaining of left sided "body pain" that started the day prior. He is currently has sepsis secondary to probable UTI along with urothelial cancer. PMH: chronic pain, Lt AKA, SCI (incomplete), bipolar, seizures, Hepatitis C, Colostomy, Rt hip fracture.     PT Comments    Plan is for d/c to SNF today. Pt continues to refuse OOB.   Follow Up Recommendations  SNF     Equipment Recommendations  None recommended by PT    Recommendations for Other Services       Precautions / Restrictions Precautions Precautions: Fall    Mobility  Bed Mobility   Bed Mobility: Rolling Rolling: Min assist         General bed mobility comments: Heavy use of bed rails. Pt able to pull himself up to long sitting in bed with min assist and heavy use of bed rails. Pt refusing sitting EOB or bed to chair. Pt becoming agitated with encouragement.  Transfers                    Ambulation/Gait                 Stairs            Wheelchair Mobility    Modified Rankin (Stroke Patients Only)       Balance                                    Cognition Arousal/Alertness: Awake/alert Behavior During Therapy: Anxious;Agitated Overall Cognitive Status: Within Functional Limits for tasks assessed                      Exercises      General Comments        Pertinent Vitals/Pain Pain Assessment: 0-10 Pain Score: 8  Pain Location: all over, chronic pain Pain Intervention(s): Limited activity within patient's tolerance;Relaxation    Home Living                      Prior Function            PT Goals (current goals can now be found in the care plan section) Acute Rehab PT Goals Patient Stated Goal: be able to use my wheelchair  again PT Goal Formulation: With patient Time For Goal Achievement: 05/18/15 Potential to Achieve Goals: Fair Progress towards PT goals: Not progressing toward goals - comment (pt refusal for increased mobility)    Frequency  Min 2X/week    PT Plan Current plan remains appropriate    Co-evaluation             End of Session   Activity Tolerance: No increased pain;Other (comment) (limited mobility per pt refusal) Patient left: in bed;with call bell/phone within reach     Time: 0918-0929 PT Time Calculation (min) (ACUTE ONLY): 11 min  Charges:  $Therapeutic Activity: 8-22 mins                    G Codes:      Lorriane Shire 05/07/2015, 9:38 AM

## 2015-05-07 NOTE — Clinical Social Work Placement (Signed)
   CLINICAL SOCIAL WORK PLACEMENT  NOTE  Date:  05/07/2015  Patient Details  Name: Samuel [name] MRN: [id number] Date of Birth: 1955/11/07  Clinical Social Work is seeking post-discharge placement for this patient at the [place] level of care (*CSW will initial, date and re-position this form in  chart as items are completed):  Yes   Patient/family provided with [place] Work Department's list of facilities offering this level of care within the geographic area requested by the patient (or if unable, by the patient's family).  Yes   Patient/family informed of their freedom to choose among providers that offer the needed level of care, that participate in Medicare, Medicaid or managed care program needed by the patient, have an available bed and are willing to accept the patient.  Yes   Patient/family informed of [facility]'s ownership interest in [facility] and [facility], as well as of the fact that they are under no obligation to receive care at these facilities.  PASRR submitted to EDS on       PASRR number received on       Existing PASRR number confirmed on 05/03/15 (good until 05/22/15)     FL2 transmitted to all facilities in geographic area requested by pt/family on       FL2 transmitted to all facilities within larger geographic area on       Patient informed that his/her managed care company has contracts with or will negotiate with certain facilities, including the following:        Yes   Patient/family informed of bed offers received.  Patient chooses bed at [place] recommends and patient chooses bed at      Patient to be transferred to [facility] on 05/07/15.  Patient to be transferred to facility by [facility]     Patient family notified on 05/07/15 of transfer.  Name of family member notified:  [name]     PHYSICIAN       Additional Comment:     _______________________________________________ Caroline Sauger, LCSW 05/07/2015, 2:34 PM

## 2015-05-10 ENCOUNTER — Encounter: Payer: Self-pay | Admitting: Internal Medicine

## 2015-05-10 ENCOUNTER — Non-Acute Institutional Stay (SKILLED_NURSING_FACILITY): Payer: Medicare Other | Admitting: Internal Medicine

## 2015-05-10 DIAGNOSIS — C689 Malignant neoplasm of urinary organ, unspecified: Secondary | ICD-10-CM

## 2015-05-10 DIAGNOSIS — R569 Unspecified convulsions: Secondary | ICD-10-CM

## 2015-05-10 DIAGNOSIS — G894 Chronic pain syndrome: Secondary | ICD-10-CM | POA: Diagnosis not present

## 2015-05-10 DIAGNOSIS — J189 Pneumonia, unspecified organism: Secondary | ICD-10-CM

## 2015-05-10 DIAGNOSIS — I10 Essential (primary) hypertension: Secondary | ICD-10-CM | POA: Diagnosis not present

## 2015-05-10 DIAGNOSIS — R112 Nausea with vomiting, unspecified: Secondary | ICD-10-CM

## 2015-05-10 DIAGNOSIS — D638 Anemia in other chronic diseases classified elsewhere: Secondary | ICD-10-CM | POA: Diagnosis not present

## 2015-05-10 DIAGNOSIS — R8271 Bacteriuria: Secondary | ICD-10-CM

## 2015-05-10 DIAGNOSIS — F419 Anxiety disorder, unspecified: Secondary | ICD-10-CM | POA: Diagnosis not present

## 2015-05-10 DIAGNOSIS — A419 Sepsis, unspecified organism: Secondary | ICD-10-CM
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
## 2015-05-11 ENCOUNTER — Observation Stay (HOSPITAL_COMMUNITY)
Admission: EM | Admit: 2015-05-11 | Discharge: 2015-05-15 | Disposition: A | Discharge status: Skilled Nursing Facility | Payer: Medicare Other | Attending: Internal Medicine | Admitting: Internal Medicine

## 2015-05-11 ENCOUNTER — Encounter (HOSPITAL_COMMUNITY): Payer: Self-pay | Admitting: Emergency Medicine

## 2015-05-11 ENCOUNTER — Emergency Department (HOSPITAL_COMMUNITY): Payer: Medicare Other

## 2015-05-11 DIAGNOSIS — D638 Anemia in other chronic diseases classified elsewhere: Secondary | ICD-10-CM | POA: Diagnosis present

## 2015-05-11 DIAGNOSIS — E871 Hypo-osmolality and hyponatremia: Secondary | ICD-10-CM | POA: Diagnosis present

## 2015-05-11 DIAGNOSIS — T451X5A Adverse effect of antineoplastic and immunosuppressive drugs, initial encounter: Secondary | ICD-10-CM

## 2015-05-11 DIAGNOSIS — Z86718 Personal history of other venous thrombosis and embolism: Secondary | ICD-10-CM | POA: Insufficient documentation

## 2015-05-11 DIAGNOSIS — G8929 Other chronic pain: Secondary | ICD-10-CM | POA: Diagnosis not present

## 2015-05-11 DIAGNOSIS — N183 Chronic kidney disease, stage 3 unspecified: Secondary | ICD-10-CM | POA: Diagnosis present

## 2015-05-11 DIAGNOSIS — Z8550 Personal history of malignant neoplasm of unspecified urinary tract organ: Secondary | ICD-10-CM | POA: Diagnosis not present

## 2015-05-11 DIAGNOSIS — N319 Neuromuscular dysfunction of bladder, unspecified: Secondary | ICD-10-CM | POA: Insufficient documentation

## 2015-05-11 DIAGNOSIS — R569 Unspecified convulsions: Secondary | ICD-10-CM | POA: Diagnosis not present

## 2015-05-11 DIAGNOSIS — I10 Essential (primary) hypertension: Secondary | ICD-10-CM | POA: Diagnosis present

## 2015-05-11 DIAGNOSIS — N1330 Unspecified hydronephrosis: Secondary | ICD-10-CM | POA: Insufficient documentation

## 2015-05-11 DIAGNOSIS — D631 Anemia in chronic kidney disease: Secondary | ICD-10-CM | POA: Insufficient documentation

## 2015-05-11 DIAGNOSIS — Z87891 Personal history of nicotine dependence: Secondary | ICD-10-CM | POA: Diagnosis not present

## 2015-05-11 DIAGNOSIS — D649 Anemia, unspecified: Secondary | ICD-10-CM | POA: Diagnosis present

## 2015-05-11 DIAGNOSIS — F419 Anxiety disorder, unspecified: Secondary | ICD-10-CM | POA: Diagnosis not present

## 2015-05-11 DIAGNOSIS — K219 Gastro-esophageal reflux disease without esophagitis: Secondary | ICD-10-CM | POA: Diagnosis not present

## 2015-05-11 DIAGNOSIS — D72829 Elevated white blood cell count, unspecified: Secondary | ICD-10-CM | POA: Insufficient documentation

## 2015-05-11 DIAGNOSIS — F329 Major depressive disorder, single episode, unspecified: Secondary | ICD-10-CM | POA: Diagnosis not present

## 2015-05-11 DIAGNOSIS — I82409 Acute embolism and thrombosis of unspecified deep veins of unspecified lower extremity: Secondary | ICD-10-CM | POA: Diagnosis present

## 2015-05-11 DIAGNOSIS — E785 Hyperlipidemia, unspecified: Secondary | ICD-10-CM | POA: Insufficient documentation

## 2015-05-11 DIAGNOSIS — D6481 Anemia due to antineoplastic chemotherapy: Secondary | ICD-10-CM | POA: Diagnosis present

## 2015-05-11 DIAGNOSIS — G8220 Paraplegia, unspecified: Secondary | ICD-10-CM | POA: Diagnosis not present

## 2015-05-11 DIAGNOSIS — F32A Depression, unspecified: Secondary | ICD-10-CM | POA: Diagnosis present

## 2015-05-11 DIAGNOSIS — Z79899 Other long term (current) drug therapy: Secondary | ICD-10-CM | POA: Diagnosis not present

## 2015-05-11 DIAGNOSIS — N179 Acute kidney failure, unspecified: Secondary | ICD-10-CM | POA: Diagnosis not present

## 2015-05-11 DIAGNOSIS — T83022A Displacement of nephrostomy catheter, initial encounter: Principal | ICD-10-CM | POA: Insufficient documentation

## 2015-05-11 DIAGNOSIS — I129 Hypertensive chronic kidney disease with stage 1 through stage 4 chronic kidney disease, or unspecified chronic kidney disease: Secondary | ICD-10-CM | POA: Insufficient documentation

## 2015-05-11 DIAGNOSIS — Z89612 Acquired absence of left leg above knee: Secondary | ICD-10-CM | POA: Diagnosis not present

## 2015-05-11 DIAGNOSIS — Z86711 Personal history of pulmonary embolism: Secondary | ICD-10-CM | POA: Insufficient documentation

## 2015-05-11 DIAGNOSIS — I2699 Other pulmonary embolism without acute cor pulmonale: Secondary | ICD-10-CM | POA: Diagnosis present

## 2015-05-11 DIAGNOSIS — B1920 Unspecified viral hepatitis C without hepatic coma: Secondary | ICD-10-CM | POA: Diagnosis not present

## 2015-05-11 DIAGNOSIS — N99528 Other complication of other external stoma of urinary tract: Secondary | ICD-10-CM | POA: Diagnosis present

## 2015-05-11 DIAGNOSIS — C689 Malignant neoplasm of urinary organ, unspecified: Secondary | ICD-10-CM | POA: Diagnosis present

## 2015-05-11 LAB — CBC WITH DIFFERENTIAL/PLATELET
BASOS ABS: 0 10*3/uL (ref 0.0–0.1)
BASOS PCT: 1 %
EOS PCT: 6 %
Eosinophils Absolute: 0.4 10*3/uL (ref 0.0–0.7)
HEMATOCRIT: 47.5 % (ref 39.0–52.0)
Hemoglobin: 15.8 g/dL (ref 13.0–17.0)
LYMPHS PCT: 7 %
Lymphs Abs: 0.5 10*3/uL — ABNORMAL LOW (ref 0.7–4.0)
MCH: 28.7 pg (ref 26.0–34.0)
MCHC: 33.3 g/dL (ref 30.0–36.0)
MCV: 86.2 fL (ref 78.0–100.0)
MONO ABS: 0.5 10*3/uL (ref 0.1–1.0)
MONOS PCT: 9 %
Neutro Abs: 4.7 10*3/uL (ref 1.7–7.7)
Neutrophils Relative %: 77 %
PLATELETS: 149 10*3/uL — AB (ref 150–400)
RBC: 5.51 MIL/uL (ref 4.22–5.81)
RDW: 16.1 % — AB (ref 11.5–15.5)
WBC: 6.1 10*3/uL (ref 4.0–10.5)

## 2015-05-11 LAB — BASIC METABOLIC PANEL
Anion gap: 9 (ref 5–15)
BUN: 8 mg/dL (ref 6–20)
CALCIUM: 8.6 mg/dL — AB (ref 8.9–10.3)
CO2: 22 mmol/L (ref 22–32)
Chloride: 98 mmol/L — ABNORMAL LOW (ref 101–111)
Creatinine, Ser: 1.56 mg/dL — ABNORMAL HIGH (ref 0.61–1.24)
GFR calc Af Amer: 55 mL/min — ABNORMAL LOW (ref >60)
GFR, EST NON AFRICAN AMERICAN: 48 mL/min — AB (ref >60)
GLUCOSE: 92 mg/dL (ref 65–99)
Potassium: 4.2 mmol/L (ref 3.5–5.1)
Sodium: 129 mmol/L — ABNORMAL LOW (ref 135–145)

## 2015-05-11 MED ORDER — ONDANSETRON HCL 4 MG/2ML IJ SOLN
4.0000 mg | Freq: Once | INTRAMUSCULAR | Status: AC
  Administered 2015-05-11: 4 mg via INTRAVENOUS
  Filled 2015-05-11: qty 2

## 2015-05-11 MED ORDER — HYDROMORPHONE HCL 1 MG/ML IJ SOLN
1.0000 mg | Freq: Once | INTRAMUSCULAR | Status: AC
  Administered 2015-05-11: 1 mg via INTRAVENOUS
  Filled 2015-05-11: qty 1

## 2015-05-11 NOTE — ED Notes (Signed)
Accessed patient's port.  Port has not been documented when it was placed and by whom.
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
## 2015-05-11 NOTE — ED Provider Notes (Signed)
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 He presents after having nephrostomy tube which was dislodged sometime approximately 18 hours ago, the patient has nausea, pain, specifically left-sided but she states is where the pain is referred to all the time with his paraplegia.  Soft abdomen, nephrostomy tube absent in the back, no bleeding or drainage from the nephrostomy site, patient does not appear to be in distress  We'll discuss with interventional radiology regarding procedure to replace tube either tonight or tomorrow morning. When I perform a bedside ultrasound I cannot clearly identify the kidney to see if there is hydronephrosis, ultrasound ordered.  D/w Dr. Barbie Banner of IR will do procedure in AM  D/w Dr. Posey Pronto who will admit.  Medical screening examination/treatment/procedure(s) were conducted as a shared visit with non-physician practitioner(s) and myself.  I personally evaluated the patient during the encounter.  Clinical Impression:   Final diagnoses:  Nephrostomy tube displaced ([place])  Hydronephrosis of right kidney         Noemi Chapel, MD 05/12/15 0040

## 2015-05-11 NOTE — ED Notes (Signed)
Delay in lab draw, pt wants his pick line accessed

## 2015-05-11 NOTE — ED Notes (Signed)
Bed: QA06 Expected date:  Expected time:  Means of arrival:  Comments: EMS nephrostomy tube out

## 2015-05-11 NOTE — ED Notes (Signed)
Per EMS, patient is from [facility].  He reports his nephrostomy tube was dislodged sometime last night during his sleep.  Pt states he has pain in area and has blood in urine from foley.    BP: 152/80 P:112 O2: 94

## 2015-05-12 ENCOUNTER — Observation Stay (HOSPITAL_COMMUNITY): Payer: Medicare Other

## 2015-05-12 DIAGNOSIS — N99528 Other complication of other external stoma of urinary tract: Secondary | ICD-10-CM | POA: Diagnosis present

## 2015-05-12 DIAGNOSIS — C689 Malignant neoplasm of urinary organ, unspecified: Secondary | ICD-10-CM

## 2015-05-12 DIAGNOSIS — N1330 Unspecified hydronephrosis: Secondary | ICD-10-CM | POA: Diagnosis present

## 2015-05-12 DIAGNOSIS — N179 Acute kidney failure, unspecified: Secondary | ICD-10-CM | POA: Diagnosis not present

## 2015-05-12 DIAGNOSIS — E785 Hyperlipidemia, unspecified: Secondary | ICD-10-CM

## 2015-05-12 DIAGNOSIS — T83022A Displacement of nephrostomy catheter, initial encounter: Secondary | ICD-10-CM

## 2015-05-12 DIAGNOSIS — R569 Unspecified convulsions: Secondary | ICD-10-CM

## 2015-05-12 DIAGNOSIS — I10 Essential (primary) hypertension: Secondary | ICD-10-CM

## 2015-05-12 DIAGNOSIS — G8929 Other chronic pain: Secondary | ICD-10-CM

## 2015-05-12 DIAGNOSIS — T451X5A Adverse effect of antineoplastic and immunosuppressive drugs, initial encounter: Secondary | ICD-10-CM

## 2015-05-12 DIAGNOSIS — N183 Chronic kidney disease, stage 3 unspecified: Secondary | ICD-10-CM | POA: Diagnosis present

## 2015-05-12 DIAGNOSIS — D6481 Anemia due to antineoplastic chemotherapy: Secondary | ICD-10-CM

## 2015-05-12 DIAGNOSIS — D649 Anemia, unspecified: Secondary | ICD-10-CM

## 2015-05-12 DIAGNOSIS — E871 Hypo-osmolality and hyponatremia: Secondary | ICD-10-CM

## 2015-05-12 LAB — BASIC METABOLIC PANEL
ANION GAP: 9 (ref 5–15)
Anion gap: 11 (ref 5–15)
BUN: 9 mg/dL (ref 6–20)
BUN: 8 mg/dL (ref 6–20)
CO2: 22 mmol/L (ref 22–32)
CO2: 22 mmol/L (ref 22–32)
Calcium: 8.6 mg/dL — ABNORMAL LOW (ref 8.9–10.3)
Calcium: 8.3 mg/dL — ABNORMAL LOW (ref 8.9–10.3)
Chloride: 99 mmol/L — ABNORMAL LOW (ref 101–111)
Chloride: 101 mmol/L (ref 101–111)
Creatinine, Ser: 1.65 mg/dL — ABNORMAL HIGH (ref 0.61–1.24)
Creatinine, Ser: 1.69 mg/dL — ABNORMAL HIGH (ref 0.61–1.24)
GFR calc Af Amer: 52 mL/min — ABNORMAL LOW (ref >60)
GFR calc Af Amer: 50 mL/min — ABNORMAL LOW (ref >60)
GFR, EST NON AFRICAN AMERICAN: 45 mL/min — AB (ref >60)
GFR, EST NON AFRICAN AMERICAN: 43 mL/min — AB (ref >60)
GLUCOSE: 93 mg/dL (ref 65–99)
GLUCOSE: 93 mg/dL (ref 65–99)
POTASSIUM: 4.4 mmol/L (ref 3.5–5.1)
Potassium: 4.4 mmol/L (ref 3.5–5.1)
SODIUM: 132 mmol/L — AB (ref 135–145)
Sodium: 132 mmol/L — ABNORMAL LOW (ref 135–145)

## 2015-05-12 LAB — CBC
HCT: 26.2 % — ABNORMAL LOW (ref 39.0–52.0)
HEMOGLOBIN: 8.3 g/dL — AB (ref 13.0–17.0)
MCH: 27.9 pg (ref 26.0–34.0)
MCHC: 31.7 g/dL (ref 30.0–36.0)
MCV: 88.2 fL (ref 78.0–100.0)
Platelets: 322 10*3/uL (ref 150–400)
RBC: 2.97 MIL/uL — AB (ref 4.22–5.81)
RDW: 16.3 % — ABNORMAL HIGH (ref 11.5–15.5)
WBC: 12.2 10*3/uL — ABNORMAL HIGH (ref 4.0–10.5)

## 2015-05-12 LAB — PROTIME-INR
INR: 1.34 (ref 0.00–1.49)
PROTHROMBIN TIME: 16.7 s — AB (ref 11.6–15.2)

## 2015-05-12 LAB — MRSA PCR SCREENING: MRSA by PCR: NEGATIVE

## 2015-05-12 MED ORDER — FENTANYL 50 MCG/HR TD PT72
50.0000 ug | MEDICATED_PATCH | TRANSDERMAL | Status: DC
  Administered 2015-05-12 – 2015-05-15 (×2): 50 ug via TRANSDERMAL
  Filled 2015-05-12 (×2): qty 1

## 2015-05-12 MED ORDER — ONDANSETRON HCL 4 MG/2ML IJ SOLN
4.0000 mg | Freq: Four times a day (QID) | INTRAMUSCULAR | Status: DC | PRN
  Administered 2015-05-13 – 2015-05-15 (×6): 4 mg via INTRAVENOUS
  Filled 2015-05-12 (×6): qty 2

## 2015-05-12 MED ORDER — ACETAMINOPHEN 650 MG RE SUPP
650.0000 mg | Freq: Four times a day (QID) | RECTAL | Status: DC | PRN
Start: 2015-05-12 — End: 2015-05-15

## 2015-05-12 MED ORDER — ACETAMINOPHEN 325 MG PO TABS: 650.0000 mg | ORAL_TABLET | Freq: Four times a day (QID) | ORAL | Status: DC | PRN

## 2015-05-12 MED ORDER — ONDANSETRON HCL 4 MG PO TABS
4.0000 mg | ORAL_TABLET | Freq: Four times a day (QID) | ORAL | Status: DC | PRN
  Administered 2015-05-15: 4 mg via ORAL
  Filled 2015-05-12: qty 1

## 2015-05-12 MED ORDER — LIDOCAINE HCL 1 % IJ SOLN
INTRAMUSCULAR | Status: AC
  Filled 2015-05-12: qty 20

## 2015-05-12 MED ORDER — PANTOPRAZOLE SODIUM 40 MG PO TBEC
40.0000 mg | DELAYED_RELEASE_TABLET | Freq: Every day | ORAL | Status: DC
  Administered 2015-05-13 – 2015-05-15 (×3): 40 mg via ORAL
  Filled 2015-05-12 (×3): qty 1

## 2015-05-12 MED ORDER — FOLIC ACID 1 MG PO TABS
1.0000 mg | ORAL_TABLET | Freq: Every day | ORAL | Status: DC
  Administered 2015-05-13 – 2015-05-15 (×3): 1 mg via ORAL
  Filled 2015-05-12 (×4): qty 1

## 2015-05-12 MED ORDER — ZOLPIDEM TARTRATE 5 MG PO TABS
5.0000 mg | ORAL_TABLET | Freq: Every evening | ORAL | Status: DC | PRN
  Administered 2015-05-14: 5 mg via ORAL
  Filled 2015-05-12: qty 1

## 2015-05-12 MED ORDER — LORAZEPAM 0.5 MG PO TABS
0.5000 mg | ORAL_TABLET | Freq: Three times a day (TID) | ORAL | Status: DC | PRN
  Administered 2015-05-12 – 2015-05-15 (×9): 0.5 mg via ORAL
  Filled 2015-05-12 (×10): qty 1

## 2015-05-12 MED ORDER — POLYVINYL ALCOHOL 1.4 % OP SOLN
2.0000 [drp] | Freq: Three times a day (TID) | OPHTHALMIC | Status: DC
  Administered 2015-05-12 – 2015-05-15 (×11): 2 [drp] via OPHTHALMIC
  Filled 2015-05-12: qty 15

## 2015-05-12 MED ORDER — SODIUM CHLORIDE 0.9 % IJ SOLN: 3.0000 mL | Freq: Two times a day (BID) | INTRAMUSCULAR | Status: DC

## 2015-05-12 MED ORDER — DOCUSATE SODIUM 100 MG PO CAPS
100.0000 mg | ORAL_CAPSULE | Freq: Two times a day (BID) | ORAL | Status: DC
  Administered 2015-05-12 – 2015-05-15 (×6): 100 mg via ORAL
  Filled 2015-05-12 (×8): qty 1

## 2015-05-12 MED ORDER — HEPARIN SODIUM (PORCINE) 5000 UNIT/ML IJ SOLN
5000.0000 [IU] | Freq: Three times a day (TID) | INTRAMUSCULAR | Status: DC
  Administered 2015-05-12: 5000 [IU] via SUBCUTANEOUS
  Filled 2015-05-12 (×3): qty 1

## 2015-05-12 MED ORDER — HYDROMORPHONE HCL 2 MG/ML IJ SOLN
2.0000 mg | Freq: Once | INTRAMUSCULAR | Status: AC
  Administered 2015-05-12: 2 mg via INTRAVENOUS
  Filled 2015-05-12: qty 1

## 2015-05-12 MED ORDER — FENTANYL CITRATE (PF) 100 MCG/2ML IJ SOLN
INTRAMUSCULAR | Status: AC | PRN
  Administered 2015-05-12 (×2): 50 ug via INTRAVENOUS

## 2015-05-12 MED ORDER — SODIUM CHLORIDE 0.9 % IV SOLN
INTRAVENOUS | Status: DC
  Administered 2015-05-12 – 2015-05-15 (×3): via INTRAVENOUS

## 2015-05-12 MED ORDER — HYDROMORPHONE HCL 2 MG PO TABS
2.0000 mg | ORAL_TABLET | ORAL | Status: DC | PRN
  Administered 2015-05-12 (×2): 4 mg via ORAL
  Administered 2015-05-12 (×2): 2 mg via ORAL
  Administered 2015-05-12 – 2015-05-13 (×2): 4 mg via ORAL
  Filled 2015-05-12 (×5): qty 2
  Filled 2015-05-12: qty 1

## 2015-05-12 MED ORDER — DEXTROSE 5 % IV SOLN
2.0000 g | Freq: Once | INTRAVENOUS | Status: AC
  Administered 2015-05-12: 2 g via INTRAVENOUS
  Filled 2015-05-12: qty 2

## 2015-05-12 MED ORDER — IOHEXOL 300 MG/ML  SOLN
20.0000 mL | Freq: Once | INTRAMUSCULAR | Status: AC | PRN
  Administered 2015-05-12: 20 mL

## 2015-05-12 MED ORDER — MAGNESIUM HYDROXIDE 400 MG/5ML PO SUSP: 30.0000 mL | Freq: Every day | ORAL | Status: DC | PRN

## 2015-05-12 MED ORDER — BISACODYL 10 MG RE SUPP: 10.0000 mg | Freq: Every day | RECTAL | Status: DC | PRN

## 2015-05-12 MED ORDER — SODIUM CHLORIDE 0.9 % IJ SOLN
10.0000 mL | INTRAMUSCULAR | Status: DC | PRN
  Administered 2015-05-15: 10 mL
  Filled 2015-05-12: qty 40

## 2015-05-12 MED ORDER — MIDAZOLAM HCL 2 MG/2ML IJ SOLN
INTRAMUSCULAR | Status: AC | PRN
  Administered 2015-05-12 (×2): 1 mg via INTRAVENOUS

## 2015-05-12 MED ORDER — FERROUS SULFATE 325 (65 FE) MG PO TABS
325.0000 mg | ORAL_TABLET | Freq: Two times a day (BID) | ORAL | Status: DC
  Administered 2015-05-12 – 2015-05-15 (×5): 325 mg via ORAL
  Filled 2015-05-12 (×8): qty 1

## 2015-05-12 MED ORDER — METOCLOPRAMIDE HCL 5 MG PO TABS
5.0000 mg | ORAL_TABLET | Freq: Three times a day (TID) | ORAL | Status: DC
  Administered 2015-05-12 – 2015-05-15 (×12): 5 mg via ORAL
  Filled 2015-05-12 (×13): qty 1

## 2015-05-12 MED ORDER — LAMOTRIGINE 200 MG PO TABS
200.0000 mg | ORAL_TABLET | Freq: Two times a day (BID) | ORAL | Status: DC
  Administered 2015-05-12 – 2015-05-15 (×6): 200 mg via ORAL
  Filled 2015-05-12 (×8): qty 1

## 2015-05-12 MED ORDER — POLYETHYLENE GLYCOL 3350 17 G PO PACK
17.0000 g | PACK | Freq: Two times a day (BID) | ORAL | Status: DC
  Administered 2015-05-13 – 2015-05-15 (×3): 17 g via ORAL
  Filled 2015-05-12 (×8): qty 1

## 2015-05-12 MED ORDER — SENNOSIDES-DOCUSATE SODIUM 8.6-50 MG PO TABS
1.0000 | ORAL_TABLET | Freq: Every day | ORAL | Status: DC
  Filled 2015-05-12 (×3): qty 1

## 2015-05-12 MED ORDER — HYDROMORPHONE HCL 1 MG/ML IJ SOLN
1.0000 mg | Freq: Once | INTRAMUSCULAR | Status: AC
  Administered 2015-05-12: 1 mg via INTRAVENOUS
  Filled 2015-05-12: qty 1

## 2015-05-12 MED ORDER — MIDAZOLAM HCL 2 MG/2ML IJ SOLN
INTRAMUSCULAR | Status: AC
  Filled 2015-05-12: qty 6

## 2015-05-12 MED ORDER — METOPROLOL TARTRATE 50 MG PO TABS
50.0000 mg | ORAL_TABLET | Freq: Two times a day (BID) | ORAL | Status: DC
  Administered 2015-05-12 – 2015-05-15 (×7): 50 mg via ORAL
  Filled 2015-05-12 (×8): qty 1

## 2015-05-12 MED ORDER — FENTANYL CITRATE (PF) 100 MCG/2ML IJ SOLN
INTRAMUSCULAR | Status: AC
  Filled 2015-05-12: qty 6

## 2015-05-12 MED ORDER — HEPARIN SODIUM (PORCINE) 5000 UNIT/ML IJ SOLN
5000.0000 [IU] | Freq: Three times a day (TID) | INTRAMUSCULAR | Status: DC
  Administered 2015-05-12 – 2015-05-13 (×2): 5000 [IU] via SUBCUTANEOUS
  Filled 2015-05-12 (×4): qty 1

## 2015-05-12 MED ORDER — ATORVASTATIN CALCIUM 10 MG PO TABS
10.0000 mg | ORAL_TABLET | Freq: Every day | ORAL | Status: DC
  Administered 2015-05-12 – 2015-05-15 (×4): 10 mg via ORAL
  Filled 2015-05-12 (×4): qty 1
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
## 2015-05-12 NOTE — Procedures (Signed)
L PCN axchange R PCN replaced No comp/EBL
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
## 2015-05-12 NOTE — Progress Notes (Signed)
Patient ID: Samuel [name], male   DOB: [birth year], 59 y.o.   MRN: [id number]   IR aware of Rt nephrostomy tube dislodgement  Plan for replacement today Will call for pt when can

## 2015-05-13 DIAGNOSIS — E785 Hyperlipidemia, unspecified: Secondary | ICD-10-CM | POA: Diagnosis not present

## 2015-05-13 DIAGNOSIS — I82409 Acute embolism and thrombosis of unspecified deep veins of unspecified lower extremity: Secondary | ICD-10-CM | POA: Diagnosis not present

## 2015-05-13 DIAGNOSIS — N183 Chronic kidney disease, stage 3 (moderate): Secondary | ICD-10-CM | POA: Diagnosis not present

## 2015-05-13 DIAGNOSIS — I2699 Other pulmonary embolism without acute cor pulmonale: Secondary | ICD-10-CM

## 2015-05-13 DIAGNOSIS — T83022A Displacement of nephrostomy catheter, initial encounter: Secondary | ICD-10-CM | POA: Diagnosis not present

## 2015-05-13 LAB — CBC
HCT: 24.1 % — ABNORMAL LOW (ref 39.0–52.0)
Hemoglobin: 7.5 g/dL — ABNORMAL LOW (ref 13.0–17.0)
MCH: 27.2 pg (ref 26.0–34.0)
MCHC: 31.1 g/dL (ref 30.0–36.0)
MCV: 87.3 fL (ref 78.0–100.0)
Platelets: 306 10*3/uL (ref 150–400)
RBC: 2.76 MIL/uL — AB (ref 4.22–5.81)
RDW: 16.2 % — ABNORMAL HIGH (ref 11.5–15.5)
WBC: 11.2 10*3/uL — ABNORMAL HIGH (ref 4.0–10.5)

## 2015-05-13 LAB — BASIC METABOLIC PANEL
Anion gap: 9 (ref 5–15)
BUN: 7 mg/dL (ref 6–20)
CHLORIDE: 103 mmol/L (ref 101–111)
CO2: 21 mmol/L — ABNORMAL LOW (ref 22–32)
CREATININE: 1.15 mg/dL (ref 0.61–1.24)
Calcium: 8.3 mg/dL — ABNORMAL LOW (ref 8.9–10.3)
GFR calc non Af Amer: >60 mL/min (ref >60)
Glucose, Bld: 98 mg/dL (ref 65–99)
POTASSIUM: 3.5 mmol/L (ref 3.5–5.1)
SODIUM: 133 mmol/L — AB (ref 135–145)

## 2015-05-13 MED ORDER — HYDROMORPHONE HCL 2 MG/ML IJ SOLN
2.0000 mg | INTRAMUSCULAR | Status: DC | PRN
Start: 2015-05-13 — End: 2015-05-13
  Administered 2015-05-13 (×3): 2 mg via INTRAVENOUS
  Filled 2015-05-13 (×4): qty 1

## 2015-05-13 MED ORDER — HYDROMORPHONE HCL 2 MG PO TABS
2.0000 mg | ORAL_TABLET | ORAL | Status: DC | PRN
  Administered 2015-05-13 – 2015-05-15 (×11): 4 mg via ORAL
  Filled 2015-05-13 (×11): qty 2
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
## 2015-05-13 NOTE — Progress Notes (Signed)
Referring Physician(s): TRH  Chief Complaint:  B nephrostomy  Subjective:  Rt PCN replacement L PCN exchange 10/15 in IR Doing well Output B great  Allergies: Tomato  Medications: Prior to Admission medications   Medication Sig Start Date End Date Taking? Authorizing Provider  acetaminophen (TYLENOL) 325 MG tablet Take 650 mg by mouth every 6 (six) hours as needed for moderate pain.   Yes Historical Provider, MD  alum & mag hydroxide-simeth (MAALOX PLUS) 400-400-40 MG/5ML suspension Take 20 mLs by mouth every 6 (six) hours as needed for indigestion.   Yes Historical Provider, MD  atorvastatin (LIPITOR) 10 MG tablet Take 10 mg by mouth daily at 6 PM.   Yes Historical Provider, MD  bisacodyl (DULCOLAX) 10 MG suppository Place 10 mg rectally daily as needed for mild constipation or moderate constipation.   Yes Historical Provider, MD  Cholecalciferol 50000 UNITS capsule Take 50,000 Units by mouth every 30 (thirty) days.   Yes Historical Provider, MD  cyanocobalamin 1000 MCG tablet Take 1,000 mcg by mouth daily.    Yes Historical Provider, MD  docusate sodium (COLACE) 100 MG capsule Take 100 mg by mouth 2 (two) times daily.   Yes Historical Provider, MD  fentaNYL (DURAGESIC - DOSED MCG/HR) 50 MCG/HR Place 1 patch (50 mcg total) onto the skin every 3 (three) days. 05/07/15  Yes Eugenie Filler, MD  ferrous sulfate 325 (65 FE) MG EC tablet Take 325 mg by mouth 2 (two) times daily.   Yes Historical Provider, MD  folic acid (FOLVITE) 1 MG tablet Take 1 mg by mouth daily.   Yes Historical Provider, MD  guaiFENesin (ROBITUSSIN) 100 MG/5ML SOLN Take 15 mLs by mouth 3 (three) times daily as needed for cough or to loosen phlegm.   Yes Historical Provider, MD  HYDROmorphone (DILAUDID) 2 MG tablet Take 1-2 tablets (2-4 mg total) by mouth every 4 (four) hours as needed for severe pain. 05/07/15  Yes Eugenie Filler, MD  lamoTRIgine (LAMICTAL) 200 MG tablet Take 1 tablet (200 mg total) by  mouth 2 (two) times daily. 04/20/15  Yes Theodis Blaze, MD  LORazepam (ATIVAN) 0.5 MG tablet Take 1 tablet (0.5 mg total) by mouth every 8 (eight) hours as needed for anxiety. Patient taking differently: Take 0.5 mg by mouth 2 (two) times daily.  05/07/15  Yes Eugenie Filler, MD  magnesium hydroxide (MILK OF MAGNESIA) 400 MG/5ML suspension Take 30 mLs by mouth daily as needed for mild constipation.   Yes Historical Provider, MD  metoCLOPramide (REGLAN) 5 MG tablet Take 5 mg by mouth 3 (three) times daily before meals.   Yes Historical Provider, MD  metoprolol (LOPRESSOR) 50 MG tablet Take 1 tablet (50 mg total) by mouth 2 (two) times daily. 05/07/15  Yes Eugenie Filler, MD  nitroGLYCERIN (NITROSTAT) 0.4 MG SL tablet Place 0.4 mg under the tongue every 5 (five) minutes as needed for chest pain.   Yes Historical Provider, MD  omeprazole (PRILOSEC) 40 MG capsule Take 1 capsule (40 mg total) by mouth daily. 05/07/15  Yes Eugenie Filler, MD  polyethylene glycol [facility] / GLYCOLAX) packet Take 17 g by mouth 2 (two) times daily. 05/07/15  Yes Eugenie Filler, MD  polyvinyl alcohol (LIQUIFILM TEARS) 1.4 % ophthalmic solution Place 2 drops into both eyes 3 (three) times daily.   Yes Historical Provider, MD  potassium chloride SA (K-DUR,KLOR-CON) 20 MEQ tablet Take 1 tablet (20 mEq total) by mouth daily. 05/07/15  Yes Quillian Quince  Durene Cal, MD  prochlorperazine (COMPAZINE) 10 MG tablet Take 1 tablet (10 mg total) by mouth every 6 (six) hours as needed for nausea or vomiting. 05/07/15  Yes Eugenie Filler, MD  promethazine (PHENERGAN) 25 MG/ML injection Inject 25 mg into the muscle every 4 (four) hours as needed for nausea or vomiting.   Yes Historical Provider, MD  senna-docusate (SENOKOT-S) 8.6-50 MG tablet Take 1 tablet by mouth at bedtime. 05/07/15  Yes Eugenie Filler, MD  sodium phosphate (FLEET) enema Place 1 enema rectally daily as needed (for constipation). follow package directions   Yes  Historical Provider, MD  zolpidem (AMBIEN) 5 MG tablet Take 1 tablet (5 mg total) by mouth at bedtime as needed for sleep. 05/07/15  Yes Eugenie Filler, MD  levofloxacin (LEVAQUIN) 750 MG tablet Take 1 tablet (750 mg total) by mouth daily. Take for 3 days then stop. Patient not taking: Reported on 05/11/2015 05/07/15   Eugenie Filler, MD     Vital Signs: BP 127/84 mmHg  Pulse 90  Temp(Src) 97.7 F (36.5 C) (Oral)  Resp 20  Wt 92 lb 11.2 oz (42.048 kg)  SpO2 96%  Physical Exam  Skin: Skin is warm and dry.  B PCN sites clean and dry NT no bleeding Output B great Yellow and clear Wbc 11.2 afeb  Nursing note and vitals reviewed.   Imaging: US Renal  05/11/2015  CLINICAL DATA:  Nephrostomy tube dislodged.  Left flank pain. EXAM: RENAL / URINARY TRACT ULTRASOUND COMPLETE COMPARISON:  CT abdomen and pelvis 04/17/2015 FINDINGS: Right Kidney: Length: 13.2 cm. Limited visualization of the right kidney due to rib shadowing and body habitus. There is moderate hydronephrosis. An echogenic focus is demonstrated in the upper mid pole of the right kidney probably representing the nephrostomy tube. Location appears shallower than on the prior study and the catheter may have backed out of the renal pelvis since previous CT. Left Kidney: Length: 9.7 cm. Mild diffuse renal parenchymal thinning. No hydronephrosis. Nephrostomy tube is not identified. Bladder: Bladder is decompressed with Foley catheter and is not visualized. IMPRESSION: Moderate right hydronephrosis. Echogenic focus demonstrated consistent with the nephrostomy tube. Appears to be located in upper pole calyx. Left kidney demonstrates diffuse parenchymal thinning without hydronephrosis. Nephrostomy tube is not identified. Electronically Signed   By: Lucienne Capers M.D.   On: 05/11/2015 23:35   Ir Nephrostomy Tube Change  05/12/2015  CLINICAL DATA:  Left nephrostomy pulled back. Right nephrostomy pulled out. EXAM: IR NEPHROURETERAL  CATH PLACEMENT RIGHT; PERC TUBE CHG W/CM FLUOROSCOPY TIME:  1 minutes and 24 seconds MEDICATIONS AND MEDICAL HISTORY: Versed 2 mg, Fentanyl 100 mcg. Additional Medications: None. ANESTHESIA/SEDATION: Moderate sedation time: 16 minutes CONTRAST:  10 cc Omnipaque 300 PROCEDURE: The procedure, risks, benefits, and alternatives were explained to the patient. Questions regarding the procedure were encouraged and answered. The patient understands and consents to the procedure. The back was prepped with Betadine in a sterile fashion, and a sterile drape was applied covering the operative field. A sterile gown and sterile gloves were used for the procedure. The left nephrostomy catheter was injected with contrast. It was cut and exchanged over a Bentson wire for a 10 French nephrostomy. It was looped and string fixed in the renal pelvis then sewn to the skin. Contrast was injected A copy catheter was inserted into the right nephrostomy insertion site. A Bentson wire was carefully advanced through the copy catheter and into the renal pelvis. The copy catheter was advanced  over the Bentson wire into the right renal pelvis. Contrast was injected. The copy was then exchanged for a 10 French nephrostomy over the Bentson wire. It was looped and string fixed in the right renal pelvis then sewn to the skin. Contrast was injected. FINDINGS: Imaging over the left kidney demonstrates that the tip of the left nephrostomy is barely in the collecting system. The subsequent imaging demonstrates exchange of the left nephrostomy for a new nephrostomy catheter which is coiled in the renal pelvis. Images over the right kidney demonstrating new right nephrostomy catheter with its tip coiled in the renal pelvis. COMPLICATIONS: None IMPRESSION: Successful left nephrostomy catheter exchange and right nephrostomy catheter replacement. Electronically Signed   By: Marybelle Killings M.D.   On: 05/12/2015 14:54   Ir  Nephroureteral Cath Place  Right  05/12/2015  CLINICAL DATA:  Left nephrostomy pulled back. Right nephrostomy pulled out. EXAM: IR NEPHROURETERAL CATH PLACEMENT RIGHT; PERC TUBE CHG W/CM FLUOROSCOPY TIME:  1 minutes and 24 seconds MEDICATIONS AND MEDICAL HISTORY: Versed 2 mg, Fentanyl 100 mcg. Additional Medications: None. ANESTHESIA/SEDATION: Moderate sedation time: 16 minutes CONTRAST:  10 cc Omnipaque 300 PROCEDURE: The procedure, risks, benefits, and alternatives were explained to the patient. Questions regarding the procedure were encouraged and answered. The patient understands and consents to the procedure. The back was prepped with Betadine in a sterile fashion, and a sterile drape was applied covering the operative field. A sterile gown and sterile gloves were used for the procedure. The left nephrostomy catheter was injected with contrast. It was cut and exchanged over a Bentson wire for a 10 French nephrostomy. It was looped and string fixed in the renal pelvis then sewn to the skin. Contrast was injected A copy catheter was inserted into the right nephrostomy insertion site. A Bentson wire was carefully advanced through the copy catheter and into the renal pelvis. The copy catheter was advanced over the Bentson wire into the right renal pelvis. Contrast was injected. The copy was then exchanged for a 10 French nephrostomy over the Bentson wire. It was looped and string fixed in the right renal pelvis then sewn to the skin. Contrast was injected. FINDINGS: Imaging over the left kidney demonstrates that the tip of the left nephrostomy is barely in the collecting system. The subsequent imaging demonstrates exchange of the left nephrostomy for a new nephrostomy catheter which is coiled in the renal pelvis. Images over the right kidney demonstrating new right nephrostomy catheter with its tip coiled in the renal pelvis. COMPLICATIONS: None IMPRESSION: Successful left nephrostomy catheter exchange and right nephrostomy catheter  replacement. Electronically Signed   By: Marybelle Killings M.D.   On: 05/12/2015 14:54    Labs:  CBC:  Recent Labs  05/06/15 0540 05/11/15 2245 05/12/15 1015 05/13/15 0800  WBC 10.0 6.1 12.2* 11.2*  HGB 8.3* 15.8 8.3* 7.5*  HCT 25.2* 47.5 26.2* 24.1*  PLT 285 149* 322 306    COAGS:  Recent Labs  09/26/14 1055  11/10/14 0855 11/28/14 1330  02/18/15 0025  04/25/15 0430 04/26/15 0509 04/27/15 0426 05/12/15 0510  INR 1.14  < > 1.21 1.21  < > 3.54*  < > 2.27* 1.83* 1.54* 1.34  APTT 32  --  27 37  --  52*  --   --   --   --   --   < > = values in this interval not displayed.  BMP:  Recent Labs  05/11/15 2245 05/12/15 0510 05/12/15 1015 05/13/15 0800  NA  129* 132* 132* 133*  K 4.2 4.4 4.4 3.5  CL 98* 99* 101 103  CO2 22 22 22  21*  GLUCOSE 92 93 93 98  BUN 8 9 8 7   CALCIUM 8.6* 8.6* 8.3* 8.3*  CREATININE 1.56* 1.65* 1.69* 1.15  GFRNONAA 48* 45* 43* >60  GFRAA 55* 52* 50* >60    LIVER FUNCTION TESTS:  Recent Labs  04/23/15 2145 04/24/15 0315 04/26/15 0509 05/02/15 0025  BILITOT 0.4 0.7 0.6 0.5  AST 20 18 19 16   ALT 8* 7* 9* 8*  ALKPHOS 88 81 82 78  PROT 7.8 6.8 6.6 6.6  ALBUMIN 2.1* 2.0* 1.8* 2.0*    Assessment and Plan:  B PCN intact Draining well Follow with Uro  Signed: Faizon Capozzi A 05/13/2015, 9:46 AM   I spent a total of 15 Minutes at the the patient's bedside AND on the patient's hospital floor or unit, greater than 50% of which was counseling/coordinating care for B PCNs

## 2015-05-14 ENCOUNTER — Encounter: Payer: Self-pay | Admitting: Internal Medicine

## 2015-05-14 DIAGNOSIS — I82409 Acute embolism and thrombosis of unspecified deep veins of unspecified lower extremity: Secondary | ICD-10-CM | POA: Diagnosis not present

## 2015-05-14 DIAGNOSIS — D72829 Elevated white blood cell count, unspecified: Secondary | ICD-10-CM | POA: Diagnosis present

## 2015-05-14 DIAGNOSIS — R1110 Vomiting, unspecified: Secondary | ICD-10-CM | POA: Insufficient documentation

## 2015-05-14 DIAGNOSIS — D638 Anemia in other chronic diseases classified elsewhere: Secondary | ICD-10-CM | POA: Diagnosis present

## 2015-05-14 DIAGNOSIS — A419 Sepsis, unspecified organism: Secondary | ICD-10-CM | POA: Insufficient documentation

## 2015-05-14 DIAGNOSIS — G894 Chronic pain syndrome: Secondary | ICD-10-CM | POA: Insufficient documentation

## 2015-05-14 DIAGNOSIS — R8271 Bacteriuria: Secondary | ICD-10-CM | POA: Insufficient documentation

## 2015-05-14 DIAGNOSIS — E785 Hyperlipidemia, unspecified: Secondary | ICD-10-CM | POA: Diagnosis not present

## 2015-05-14 DIAGNOSIS — F419 Anxiety disorder, unspecified: Secondary | ICD-10-CM | POA: Insufficient documentation

## 2015-05-14 DIAGNOSIS — N183 Chronic kidney disease, stage 3 (moderate): Secondary | ICD-10-CM | POA: Diagnosis not present

## 2015-05-14 DIAGNOSIS — T83022A Displacement of nephrostomy catheter, initial encounter: Secondary | ICD-10-CM | POA: Diagnosis not present

## 2015-05-14 DIAGNOSIS — J189 Pneumonia, unspecified organism: Secondary | ICD-10-CM | POA: Insufficient documentation

## 2015-05-14 DIAGNOSIS — Z86711 Personal history of pulmonary embolism: Secondary | ICD-10-CM | POA: Diagnosis present

## 2015-05-14 LAB — CBC
HEMATOCRIT: 25 % — AB (ref 39.0–52.0)
HEMOGLOBIN: 7.9 g/dL — AB (ref 13.0–17.0)
MCH: 28.1 pg (ref 26.0–34.0)
MCHC: 31.6 g/dL (ref 30.0–36.0)
MCV: 89 fL (ref 78.0–100.0)
Platelets: 326 10*3/uL (ref 150–400)
RBC: 2.81 MIL/uL — ABNORMAL LOW (ref 4.22–5.81)
RDW: 16.1 % — ABNORMAL HIGH (ref 11.5–15.5)
WBC: 11.3 10*3/uL — ABNORMAL HIGH (ref 4.0–10.5)

## 2015-05-14 LAB — PREPARE RBC (CROSSMATCH)

## 2015-05-14 MED ORDER — ONDANSETRON HCL 4 MG/2ML IJ SOLN
4.0000 mg | Freq: Once | INTRAMUSCULAR | Status: AC
  Administered 2015-05-14: 4 mg via INTRAVENOUS
  Filled 2015-05-14: qty 2

## 2015-05-14 MED ORDER — SODIUM CHLORIDE 0.9 % IV SOLN
Freq: Once | INTRAVENOUS | Status: AC
  Administered 2015-05-14: 15:00:00 via INTRAVENOUS

## 2015-05-14 MED ORDER — HYDROMORPHONE HCL 1 MG/ML IJ SOLN
1.0000 mg | INTRAMUSCULAR | Status: DC | PRN
  Administered 2015-05-14: 1 mg via INTRAVENOUS
  Filled 2015-05-14: qty 2

## 2015-05-14 NOTE — Assessment & Plan Note (Signed)
SNF - Hb stable in hospital; cont iron and folate, will monitor in SNF with CBVC

## 2015-05-14 NOTE — Assessment & Plan Note (Signed)
Patient was admitted to sepsis felt to be secondary to a probable healthcare associated pneumonia versus aspiration pneumonia versus probable UTI. Patient was pancultured. Patient was placed empirically on IV Primaxin and IV vancomycin. IV vancomycin was subsequently discontinued. Patient remained afebrile throughout the hospitalization. Patient improved clinically his diet was advanced to a regular diet. Aspiration precautions were in place. SNF -  3 more days of oral Levaquin to complete on 8 day course of antibiotic therapy

## 2015-05-14 NOTE — Assessment & Plan Note (Signed)
SNF - cont lamictal 200 mg BID

## 2015-05-14 NOTE — Assessment & Plan Note (Signed)
Patient is currently a DO NOT RESUSCITATE CODE STATUS. Per last admission focus was supposed to be primary on comfort. Palliative care consultation followed; SNF - pt has made himself FULL CODE now back at SNF, he did the same prior with full understanding; pallative has followed him in SNF; pt does not want DNR, he wants full diet and narcotics, IV narcotics if possible.
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
## 2015-05-14 NOTE — Assessment & Plan Note (Signed)
Patient's blood pressure medications were initially held on admission secondary to borderline blood pressure. Patient's blood pressure improved and patient was resumed initially on Norvasc and metoprolol 25 mg twice daily. Patient's metoprolol was subsequently increased to 50 mg twice a day and Norvasc discontinued.  SNF - BP stable; cont metoprolol 50 mg BID

## 2015-05-14 NOTE — Assessment & Plan Note (Signed)
Patient with complaints of pain. Increased fentanyl patch to 50 g every 72 hours yesterday. Continued home regimen of oral dilaudid for breakthrough pain. Palliative care followed. SNF - goal is pallative care negotiate to negotiate narcotic meds with pt;goal is not to excalate narcotics

## 2015-05-14 NOTE — Assessment & Plan Note (Signed)
Likely secondary to constipation. Patient with bowel movement after enema, and now with good BM and on good bowel regimen. Nausea and emesis improved. No further emesis. Continued on Compazine and Zofran as needed.

## 2015-05-14 NOTE — Progress Notes (Signed)
This encounter was created in error - please disregard.

## 2015-05-14 NOTE — Assessment & Plan Note (Signed)
SNF -  scheduled Ativan twice a day and Ativan prn.

## 2015-05-14 NOTE — Progress Notes (Signed)
Patient ID: Samuel [name], male   DOB: July 21, 1956, 59 y.o.   MRN: [id number]           Subjective: Patient doing fairly well this morning. States bilateral nephrostomy tubes are functioning/draining fine. Some mild tenderness at insertion sites.    Allergies: Tomato  Medications: Prior to Admission medications   Medication Sig Start Date End Date Taking? Authorizing Provider  acetaminophen (TYLENOL) 325 MG tablet Take 650 mg by mouth every 6 (six) hours as needed for moderate pain.   Yes Historical Provider, MD  alum & mag hydroxide-simeth (MAALOX PLUS) 400-400-40 MG/5ML suspension Take 20 mLs by mouth every 6 (six) hours as needed for indigestion.   Yes Historical Provider, MD  atorvastatin (LIPITOR) 10 MG tablet Take 10 mg by mouth daily at 6 PM.   Yes Historical Provider, MD  bisacodyl (DULCOLAX) 10 MG suppository Place 10 mg rectally daily as needed for mild constipation or moderate constipation.   Yes Historical Provider, MD  Cholecalciferol 50000 UNITS capsule Take 50,000 Units by mouth every 30 (thirty) days.   Yes Historical Provider, MD  cyanocobalamin 1000 MCG tablet Take 1,000 mcg by mouth daily.    Yes Historical Provider, MD  docusate sodium (COLACE) 100 MG capsule Take 100 mg by mouth 2 (two) times daily.   Yes Historical Provider, MD  fentaNYL (DURAGESIC - DOSED MCG/HR) 50 MCG/HR Place 1 patch (50 mcg total) onto the skin every 3 (three) days. 05/07/15  Yes Eugenie Filler, MD  ferrous sulfate 325 (65 FE) MG EC tablet Take 325 mg by mouth 2 (two) times daily.   Yes Historical Provider, MD  folic acid (FOLVITE) 1 MG tablet Take 1 mg by mouth daily.   Yes Historical Provider, MD  guaiFENesin (ROBITUSSIN) 100 MG/5ML SOLN Take 15 mLs by mouth 3 (three) times daily as needed for cough or to loosen phlegm.   Yes Historical Provider, MD  HYDROmorphone (DILAUDID) 2 MG tablet Take 1-2 tablets (2-4 mg total) by mouth every 4 (four) hours as needed for severe pain. 05/07/15   Yes Eugenie Filler, MD  lamoTRIgine (LAMICTAL) 200 MG tablet Take 1 tablet (200 mg total) by mouth 2 (two) times daily. 04/20/15  Yes Theodis Blaze, MD  LORazepam (ATIVAN) 0.5 MG tablet Take 1 tablet (0.5 mg total) by mouth every 8 (eight) hours as needed for anxiety. Patient taking differently: Take 0.5 mg by mouth 2 (two) times daily.  05/07/15  Yes Eugenie Filler, MD  magnesium hydroxide (MILK OF MAGNESIA) 400 MG/5ML suspension Take 30 mLs by mouth daily as needed for mild constipation.   Yes Historical Provider, MD  metoCLOPramide (REGLAN) 5 MG tablet Take 5 mg by mouth 3 (three) times daily before meals.   Yes Historical Provider, MD  metoprolol (LOPRESSOR) 50 MG tablet Take 1 tablet (50 mg total) by mouth 2 (two) times daily. 05/07/15  Yes Eugenie Filler, MD  nitroGLYCERIN (NITROSTAT) 0.4 MG SL tablet Place 0.4 mg under the tongue every 5 (five) minutes as needed for chest pain.   Yes Historical Provider, MD  omeprazole (PRILOSEC) 40 MG capsule Take 1 capsule (40 mg total) by mouth daily. 05/07/15  Yes Eugenie Filler, MD  polyethylene glycol [facility] / GLYCOLAX) packet Take 17 g by mouth 2 (two) times daily. 05/07/15  Yes Eugenie Filler, MD  polyvinyl alcohol (LIQUIFILM TEARS) 1.4 % ophthalmic solution Place 2 drops into both eyes 3 (three) times daily.   Yes Historical Provider, MD  potassium  chloride SA (K-DUR,KLOR-CON) 20 MEQ tablet Take 1 tablet (20 mEq total) by mouth daily. 05/07/15  Yes Eugenie Filler, MD  prochlorperazine (COMPAZINE) 10 MG tablet Take 1 tablet (10 mg total) by mouth every 6 (six) hours as needed for nausea or vomiting. 05/07/15  Yes Eugenie Filler, MD  promethazine (PHENERGAN) 25 MG/ML injection Inject 25 mg into the muscle every 4 (four) hours as needed for nausea or vomiting.   Yes Historical Provider, MD  senna-docusate (SENOKOT-S) 8.6-50 MG tablet Take 1 tablet by mouth at bedtime. 05/07/15  Yes Eugenie Filler, MD  sodium phosphate (FLEET)  enema Place 1 enema rectally daily as needed (for constipation). follow package directions   Yes Historical Provider, MD  zolpidem (AMBIEN) 5 MG tablet Take 1 tablet (5 mg total) by mouth at bedtime as needed for sleep. 05/07/15  Yes Eugenie Filler, MD  levofloxacin (LEVAQUIN) 750 MG tablet Take 1 tablet (750 mg total) by mouth daily. Take for 3 days then stop. Patient not taking: Reported on 05/11/2015 05/07/15   Eugenie Filler, MD     Vital Signs: BP 141/77 mmHg  Pulse 101  Temp(Src) 99 F (37.2 C) (Oral)  Resp 18  Ht 5\' 4"  (1.626 m)  Wt 203 lb 7.8 oz (92.3 kg)  BMI 34.91 kg/m2  SpO2 92%  Physical Exam patient awake, alert. Bilateral PCN intact, insertion site clean and dry, mildly tender to palpation, output 200cc on left and 300 cc on right yellow urine  Imaging: US Renal  05/11/2015  CLINICAL DATA:  Nephrostomy tube dislodged.  Left flank pain. EXAM: RENAL / URINARY TRACT ULTRASOUND COMPLETE COMPARISON:  CT abdomen and pelvis 04/17/2015 FINDINGS: Right Kidney: Length: 13.2 cm. Limited visualization of the right kidney due to rib shadowing and body habitus. There is moderate hydronephrosis. An echogenic focus is demonstrated in the upper mid pole of the right kidney probably representing the nephrostomy tube. Location appears shallower than on the prior study and the catheter may have backed out of the renal pelvis since previous CT. Left Kidney: Length: 9.7 cm. Mild diffuse renal parenchymal thinning. No hydronephrosis. Nephrostomy tube is not identified. Bladder: Bladder is decompressed with Foley catheter and is not visualized. IMPRESSION: Moderate right hydronephrosis. Echogenic focus demonstrated consistent with the nephrostomy tube. Appears to be located in upper pole calyx. Left kidney demonstrates diffuse parenchymal thinning without hydronephrosis. Nephrostomy tube is not identified. Electronically Signed   By: Lucienne Capers M.D.   On: 05/11/2015 23:35   Ir Nephrostomy  Tube Change  05/12/2015  CLINICAL DATA:  Left nephrostomy pulled back. Right nephrostomy pulled out. EXAM: IR NEPHROURETERAL CATH PLACEMENT RIGHT; PERC TUBE CHG W/CM FLUOROSCOPY TIME:  1 minutes and 24 seconds MEDICATIONS AND MEDICAL HISTORY: Versed 2 mg, Fentanyl 100 mcg. Additional Medications: None. ANESTHESIA/SEDATION: Moderate sedation time: 16 minutes CONTRAST:  10 cc Omnipaque 300 PROCEDURE: The procedure, risks, benefits, and alternatives were explained to the patient. Questions regarding the procedure were encouraged and answered. The patient understands and consents to the procedure. The back was prepped with Betadine in a sterile fashion, and a sterile drape was applied covering the operative field. A sterile gown and sterile gloves were used for the procedure. The left nephrostomy catheter was injected with contrast. It was cut and exchanged over a Bentson wire for a 10 French nephrostomy. It was looped and string fixed in the renal pelvis then sewn to the skin. Contrast was injected A copy catheter was inserted into the right nephrostomy  insertion site. A Bentson wire was carefully advanced through the copy catheter and into the renal pelvis. The copy catheter was advanced over the Bentson wire into the right renal pelvis. Contrast was injected. The copy was then exchanged for a 10 French nephrostomy over the Bentson wire. It was looped and string fixed in the right renal pelvis then sewn to the skin. Contrast was injected. FINDINGS: Imaging over the left kidney demonstrates that the tip of the left nephrostomy is barely in the collecting system. The subsequent imaging demonstrates exchange of the left nephrostomy for a new nephrostomy catheter which is coiled in the renal pelvis. Images over the right kidney demonstrating new right nephrostomy catheter with its tip coiled in the renal pelvis. COMPLICATIONS: None IMPRESSION: Successful left nephrostomy catheter exchange and right nephrostomy catheter  replacement. Electronically Signed   By: Marybelle Killings M.D.   On: 05/12/2015 14:54   Ir  Nephroureteral Cath Place Right  05/12/2015  CLINICAL DATA:  Left nephrostomy pulled back. Right nephrostomy pulled out. EXAM: IR NEPHROURETERAL CATH PLACEMENT RIGHT; PERC TUBE CHG W/CM FLUOROSCOPY TIME:  1 minutes and 24 seconds MEDICATIONS AND MEDICAL HISTORY: Versed 2 mg, Fentanyl 100 mcg. Additional Medications: None. ANESTHESIA/SEDATION: Moderate sedation time: 16 minutes CONTRAST:  10 cc Omnipaque 300 PROCEDURE: The procedure, risks, benefits, and alternatives were explained to the patient. Questions regarding the procedure were encouraged and answered. The patient understands and consents to the procedure. The back was prepped with Betadine in a sterile fashion, and a sterile drape was applied covering the operative field. A sterile gown and sterile gloves were used for the procedure. The left nephrostomy catheter was injected with contrast. It was cut and exchanged over a Bentson wire for a 10 French nephrostomy. It was looped and string fixed in the renal pelvis then sewn to the skin. Contrast was injected A copy catheter was inserted into the right nephrostomy insertion site. A Bentson wire was carefully advanced through the copy catheter and into the renal pelvis. The copy catheter was advanced over the Bentson wire into the right renal pelvis. Contrast was injected. The copy was then exchanged for a 10 French nephrostomy over the Bentson wire. It was looped and string fixed in the right renal pelvis then sewn to the skin. Contrast was injected. FINDINGS: Imaging over the left kidney demonstrates that the tip of the left nephrostomy is barely in the collecting system. The subsequent imaging demonstrates exchange of the left nephrostomy for a new nephrostomy catheter which is coiled in the renal pelvis. Images over the right kidney demonstrating new right nephrostomy catheter with its tip coiled in the renal pelvis.  COMPLICATIONS: None IMPRESSION: Successful left nephrostomy catheter exchange and right nephrostomy catheter replacement. Electronically Signed   By: Marybelle Killings M.D.   On: 05/12/2015 14:54    Labs:  CBC:  Recent Labs  05/11/15 2245 05/12/15 1015 05/13/15 0800 05/14/15 0740  WBC 6.1 12.2* 11.2* 11.3*  HGB 15.8 8.3* 7.5* 7.9*  HCT 47.5 26.2* 24.1* 25.0*  PLT 149* 322 306 326    COAGS:  Recent Labs  09/26/14 1055  11/10/14 0855 11/28/14 1330  02/18/15 0025  04/25/15 0430 04/26/15 0509 04/27/15 0426 05/12/15 0510  INR 1.14  < > 1.21 1.21  < > 3.54*  < > 2.27* 1.83* 1.54* 1.34  APTT 32  --  27 37  --  52*  --   --   --   --   --   < > =  values in this interval not displayed.  BMP:  Recent Labs  05/11/15 2245 05/12/15 0510 05/12/15 1015 05/13/15 0800  NA 129* 132* 132* 133*  K 4.2 4.4 4.4 3.5  CL 98* 99* 101 103  CO2 22 22 22  21*  GLUCOSE 92 93 93 98  BUN 8 9 8 7   CALCIUM 8.6* 8.6* 8.3* 8.3*  CREATININE 1.56* 1.65* 1.69* 1.15  GFRNONAA 48* 45* 43* >60  GFRAA 55* 52* 50* >60    LIVER FUNCTION TESTS:  Recent Labs  04/23/15 2145 04/24/15 0315 04/26/15 0509 05/02/15 0025  BILITOT 0.4 0.7 0.6 0.5  AST 20 18 19 16   ALT 8* 7* 9* 8*  ALKPHOS 88 81 82 78  PROT 7.8 6.8 6.6 6.6  ALBUMIN 2.1* 2.0* 1.8* 2.0*    Assessment and Plan: Pt with history of urothelial cell carcinoma/urinary obstruction/bilateral ureteral stents and prior bilateral percutaneous nephrostomies on 04/07/15. Status post left nephrostomy catheter exchange and right nephrostomy catheter replacement secondary to dislodgment on 10/15. Patient currently stable with normal creatinine. Routine catheter exchanges should be performed in 6 weeks. Hemoglobin 7.9.  Further plans as per urology and IM.  Signed: D. Rowe Robert 05/14/2015, 11:32 AM   I spent a total of 15 minutes at the the patient's bedside AND on the patient's hospital floor or unit, greater than 50% of which was  counseling/coordinating care for bilateral percutaneous nephrostomies

## 2015-05-14 NOTE — Assessment & Plan Note (Signed)
Urine cultures with no growth to date. Patient was on IV Primaxin throughout the hospitalization.

## 2015-05-14 NOTE — Assessment & Plan Note (Signed)
Per chest x-ray. Patient had presented with fever, chills, tachycardia. Patient with improvement with nausea. Patient remained afebrile. Sputum Gram stain and culture were ordered which were pending at time of discharge. Patient has been seen by SLP, and no signs of aspiration per SLP. Patient understands risks of aspiration and accepts them. Patient was subsequently placed on a regular diet with thin liquids which he tolerated with aspiration precautions. Patient during the hospitalization was placed empirically on IV Primaxin. SNF - 3 more days of oral Levaquin to complete a 8 day course of antibiotics

## 2015-05-15 DIAGNOSIS — D638 Anemia in other chronic diseases classified elsewhere: Secondary | ICD-10-CM | POA: Diagnosis not present

## 2015-05-15 DIAGNOSIS — N179 Acute kidney failure, unspecified: Secondary | ICD-10-CM | POA: Diagnosis not present

## 2015-05-15 DIAGNOSIS — T83022A Displacement of nephrostomy catheter, initial encounter: Secondary | ICD-10-CM | POA: Diagnosis not present

## 2015-05-15 DIAGNOSIS — F4323 Adjustment disorder with mixed anxiety and depressed mood: Secondary | ICD-10-CM

## 2015-05-15 DIAGNOSIS — N183 Chronic kidney disease, stage 3 (moderate): Secondary | ICD-10-CM | POA: Diagnosis not present

## 2015-05-15 DIAGNOSIS — Z86711 Personal history of pulmonary embolism: Secondary | ICD-10-CM

## 2015-05-15 DIAGNOSIS — F32A Depression, unspecified: Secondary | ICD-10-CM | POA: Diagnosis present

## 2015-05-15 DIAGNOSIS — F329 Major depressive disorder, single episode, unspecified: Secondary | ICD-10-CM

## 2015-05-15 LAB — CBC
HCT: 28.6 % — ABNORMAL LOW (ref 39.0–52.0)
Hemoglobin: 9.1 g/dL — ABNORMAL LOW (ref 13.0–17.0)
MCH: 27.8 pg (ref 26.0–34.0)
MCHC: 31.8 g/dL (ref 30.0–36.0)
MCV: 87.5 fL (ref 78.0–100.0)
Platelets: 389 10*3/uL (ref 150–400)
RBC: 3.27 MIL/uL — ABNORMAL LOW (ref 4.22–5.81)
RDW: 15.6 % — AB (ref 11.5–15.5)
WBC: 13.3 10*3/uL — AB (ref 4.0–10.5)

## 2015-05-15 LAB — TYPE AND SCREEN
ABO/RH(D): O POS
Antibody Screen: NEGATIVE
Unit division: 0

## 2015-05-15 MED ORDER — LORAZEPAM 0.5 MG PO TABS: 0.5000 mg | ORAL_TABLET | Freq: Two times a day (BID) | ORAL | Status: DC

## 2015-05-15 MED ORDER — DULOXETINE HCL 30 MG PO CPEP
30.0000 mg | ORAL_CAPSULE | Freq: Every day | ORAL | Status: DC
  Administered 2015-05-15: 30 mg via ORAL
  Filled 2015-05-15: qty 1

## 2015-05-15 MED ORDER — MIRTAZAPINE 15 MG PO TABS
15.0000 mg | ORAL_TABLET | Freq: Every day | ORAL | Status: DC
  Filled 2015-05-15: qty 1

## 2015-05-15 MED ORDER — HEPARIN SOD (PORK) LOCK FLUSH 100 UNIT/ML IV SOLN
500.0000 [IU] | INTRAVENOUS | Status: AC | PRN
  Administered 2015-05-15: 500 [IU]

## 2015-05-15 MED ORDER — MIRTAZAPINE 15 MG PO TABS: 15.0000 mg | ORAL_TABLET | Freq: Every day | ORAL | Status: AC

## 2015-05-15 MED ORDER — ZOLPIDEM TARTRATE 5 MG PO TABS: 5.0000 mg | ORAL_TABLET | Freq: Every evening | ORAL | Status: DC | PRN

## 2015-05-15 MED ORDER — DULOXETINE HCL 30 MG PO CPEP: 30.0000 mg | ORAL_CAPSULE | Freq: Every day | ORAL | Status: DC

## 2015-05-15 MED ORDER — HYDROMORPHONE HCL 2 MG PO TABS: 2.0000 mg | ORAL_TABLET | ORAL | Status: DC | PRN

## 2015-05-15 NOTE — Progress Notes (Signed)
CSW advised patient of his SNF offers- he is considering his options and will make decision prior to dc (which may be today).    Eduard Clos, MSW, [place]
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
## 2015-05-15 NOTE — Clinical Social Work Note (Signed)
Clinical Social Work Assessment  Patient Details  Name: Samuel [name] MRN: [id number] Date of Birth: 11/09/1955  Date of referral:  05/14/15               Reason for consult:  Facility Placement                Permission sought to share information with:  Family Supports Permission granted to share information::  Yes, Verbal Permission Granted  Name::      (daughters)  Agency::     Relationship::     Contact Information:     Housing/Transportation Living arrangements for the past 2 months:  [employer] of Information:  Patient Patient Interpreter Needed:  None Criminal Activity/Legal Involvement Pertinent to Current Situation/Hospitalization:  No - Comment as needed Significant Relationships:  Adult Children Lives with:  Facility Resident Do you feel safe going back to the place where you live?  Yes Need for family participation in patient care:  No (Coment)  Care giving concerns:  Patient admitted from [facility] SNF- reports he would like to seek placement at other SNF's. CSW offered to fax his clinicals out to seek possible other options.    Social Worker assessment / plan:  FL2 update and fax out to area SNF's for additional options.  Employment status:  Retired Nurse, adult, Medicaid In [place] PT Recommendations:  Not assessed at this time Information / Referral to community resources:     Patient/Family's Response to care:  Patient asked that I call his daughter which I did and left message for call back-   Patient/Family's Understanding of and Emotional Response to Diagnosis, Current Treatment, and Prognosis: Patient reports understanding of his reason for being hospitalized- he is very focused on his chronic pain and lack of family support-  CSW  Offered support and sent encouragement.   Emotional Assessment Appearance:    Attitude/Demeanor/Rapport:  Aggressive (Verbally and/or physically) Affect (typically observed):     Orientation:  Oriented to Self, Oriented to Place, Oriented to  Time, Oriented to Situation Alcohol / Substance use:  Never Used Psych involvement (Current and /or in the community):  No (Comment)  Discharge Needs  Concerns to be addressed:    Readmission within the last 30 days:  Yes Current discharge risk:  None Barriers to Discharge:  No Barriers Identified   Samuel Clarks, LCSW 05/15/2015, 5:18 PM

## 2015-05-15 NOTE — Progress Notes (Signed)
CSW worked today to attempt to seek SNF placement for him at [facility] which is he and his daughter's preference. [facility] does not have a private room for him at this time and thus cannot take him today. Patient aware and not overly pleased but agreeable. PTAR to transport.      Eduard Clos, MSW, [place]

## 2015-05-15 NOTE — Care Management Note (Signed)
Case Management Note  Patient Details  Name: Samuel [name] MRN: [id number] Date of Birth: 1955-10-02  Subjective/Objective:   Noted Psych cons-await recommendations.Current d/c plan return back to SNF.                 Action/Plan:   Expected Discharge Date:                  Expected Discharge Plan:  Skilled Nursing Facility  In-House Referral:  Clinical Social Work  Discharge planning Services  CM Consult  Post Acute Care Choice:    Choice offered to:     DME Arranged:    DME Agency:     HH Arranged:    [place] Agency:     Status of Service:  In process, will continue to follow  Medicare Important Message Given:    Date Medicare IM Given:    Medicare IM give by:    Date Additional Medicare IM Given:    Additional Medicare Important Message give by:     If discussed at [place] of Stay Meetings, dates discussed:    Additional Comments:  Dessa Phi, RN 05/15/2015, 12:13 PM

## 2015-05-16 ENCOUNTER — Encounter (HOSPITAL_COMMUNITY): Payer: Self-pay | Admitting: *Deleted

## 2015-05-16 ENCOUNTER — Observation Stay (HOSPITAL_COMMUNITY)
Admission: EM | Admit: 2015-05-16 | Discharge: 2015-05-18 | Disposition: A | Discharge status: Skilled Nursing Facility | Payer: Medicare Other | Attending: Internal Medicine | Admitting: Internal Medicine

## 2015-05-16 DIAGNOSIS — E669 Obesity, unspecified: Secondary | ICD-10-CM | POA: Insufficient documentation

## 2015-05-16 DIAGNOSIS — C689 Malignant neoplasm of urinary organ, unspecified: Secondary | ICD-10-CM | POA: Diagnosis present

## 2015-05-16 DIAGNOSIS — R1030 Lower abdominal pain, unspecified: Secondary | ICD-10-CM | POA: Diagnosis not present

## 2015-05-16 DIAGNOSIS — Z87891 Personal history of nicotine dependence: Secondary | ICD-10-CM | POA: Diagnosis not present

## 2015-05-16 DIAGNOSIS — N183 Chronic kidney disease, stage 3 unspecified: Secondary | ICD-10-CM | POA: Diagnosis present

## 2015-05-16 DIAGNOSIS — Z86718 Personal history of other venous thrombosis and embolism: Secondary | ICD-10-CM | POA: Diagnosis not present

## 2015-05-16 DIAGNOSIS — Z8550 Personal history of malignant neoplasm of unspecified urinary tract organ: Secondary | ICD-10-CM | POA: Diagnosis not present

## 2015-05-16 DIAGNOSIS — Z89612 Acquired absence of left leg above knee: Secondary | ICD-10-CM | POA: Insufficient documentation

## 2015-05-16 DIAGNOSIS — N289 Disorder of kidney and ureter, unspecified: Secondary | ICD-10-CM | POA: Insufficient documentation

## 2015-05-16 DIAGNOSIS — G40909 Epilepsy, unspecified, not intractable, without status epilepticus: Secondary | ICD-10-CM | POA: Diagnosis not present

## 2015-05-16 DIAGNOSIS — D638 Anemia in other chronic diseases classified elsewhere: Secondary | ICD-10-CM | POA: Diagnosis present

## 2015-05-16 DIAGNOSIS — Z66 Do not resuscitate: Secondary | ICD-10-CM | POA: Diagnosis not present

## 2015-05-16 DIAGNOSIS — I10 Essential (primary) hypertension: Secondary | ICD-10-CM | POA: Diagnosis present

## 2015-05-16 DIAGNOSIS — Y732 Prosthetic and other implants, materials and accessory gastroenterology and urology devices associated with adverse incidents: Secondary | ICD-10-CM | POA: Diagnosis not present

## 2015-05-16 DIAGNOSIS — E785 Hyperlipidemia, unspecified: Secondary | ICD-10-CM | POA: Insufficient documentation

## 2015-05-16 DIAGNOSIS — T83022A Displacement of nephrostomy catheter, initial encounter: Secondary | ICD-10-CM | POA: Diagnosis not present

## 2015-05-16 DIAGNOSIS — A4102 Sepsis due to Methicillin resistant Staphylococcus aureus: Secondary | ICD-10-CM | POA: Diagnosis not present

## 2015-05-16 DIAGNOSIS — F329 Major depressive disorder, single episode, unspecified: Secondary | ICD-10-CM | POA: Diagnosis present

## 2015-05-16 DIAGNOSIS — E2740 Unspecified adrenocortical insufficiency: Secondary | ICD-10-CM | POA: Diagnosis not present

## 2015-05-16 DIAGNOSIS — E871 Hypo-osmolality and hyponatremia: Secondary | ICD-10-CM | POA: Diagnosis not present

## 2015-05-16 DIAGNOSIS — D631 Anemia in chronic kidney disease: Secondary | ICD-10-CM | POA: Insufficient documentation

## 2015-05-16 DIAGNOSIS — F319 Bipolar disorder, unspecified: Secondary | ICD-10-CM | POA: Insufficient documentation

## 2015-05-16 DIAGNOSIS — N1330 Unspecified hydronephrosis: Secondary | ICD-10-CM | POA: Insufficient documentation

## 2015-05-16 DIAGNOSIS — A419 Sepsis, unspecified organism: Secondary | ICD-10-CM | POA: Diagnosis present

## 2015-05-16 DIAGNOSIS — Z86711 Personal history of pulmonary embolism: Secondary | ICD-10-CM | POA: Insufficient documentation

## 2015-05-16 DIAGNOSIS — R569 Unspecified convulsions: Secondary | ICD-10-CM

## 2015-05-16 DIAGNOSIS — G894 Chronic pain syndrome: Secondary | ICD-10-CM | POA: Diagnosis not present

## 2015-05-16 DIAGNOSIS — D72829 Elevated white blood cell count, unspecified: Secondary | ICD-10-CM | POA: Insufficient documentation

## 2015-05-16 DIAGNOSIS — R000 Tachycardia, unspecified: Secondary | ICD-10-CM | POA: Diagnosis not present

## 2015-05-16 DIAGNOSIS — Z993 Dependence on wheelchair: Secondary | ICD-10-CM | POA: Insufficient documentation

## 2015-05-16 DIAGNOSIS — K219 Gastro-esophageal reflux disease without esophagitis: Secondary | ICD-10-CM | POA: Insufficient documentation

## 2015-05-16 DIAGNOSIS — N319 Neuromuscular dysfunction of bladder, unspecified: Secondary | ICD-10-CM | POA: Diagnosis not present

## 2015-05-16 DIAGNOSIS — I129 Hypertensive chronic kidney disease with stage 1 through stage 4 chronic kidney disease, or unspecified chronic kidney disease: Secondary | ICD-10-CM | POA: Diagnosis not present

## 2015-05-16 DIAGNOSIS — G546 Phantom limb syndrome with pain: Secondary | ICD-10-CM | POA: Diagnosis not present

## 2015-05-16 DIAGNOSIS — I82409 Acute embolism and thrombosis of unspecified deep veins of unspecified lower extremity: Secondary | ICD-10-CM | POA: Diagnosis present

## 2015-05-16 DIAGNOSIS — G8220 Paraplegia, unspecified: Secondary | ICD-10-CM | POA: Diagnosis not present

## 2015-05-16 DIAGNOSIS — B1920 Unspecified viral hepatitis C without hepatic coma: Secondary | ICD-10-CM | POA: Insufficient documentation

## 2015-05-16 DIAGNOSIS — F32A Depression, unspecified: Secondary | ICD-10-CM | POA: Diagnosis present

## 2015-05-16 NOTE — ED Notes (Signed)
Pt notified writer that he would prefer that blood work be drawn from his [employer]

## 2015-05-16 NOTE — ED Notes (Signed)
Pt arrives from [place], his nephrostomy tube on the right side has come out per report. VSS en route

## 2015-05-16 NOTE — ED Notes (Signed)
Bed: TC76 Expected date:  Expected time:  Means of arrival:  Comments: EMS/42M/neph tube out
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
## 2015-05-16 NOTE — Progress Notes (Signed)
CSW received psych consult for patient. Pt seen by psychiatrist with medication recommendations. CSW unable to complete psych consult prior to dc to snf. In DC summary, MD recommended follow up with outpatient psych, facility was informed.   Belia Heman, [place] Work  [employer] [phone]

## 2015-05-17 ENCOUNTER — Observation Stay (HOSPITAL_COMMUNITY): Payer: Medicare Other

## 2015-05-17 DIAGNOSIS — T83022A Displacement of nephrostomy catheter, initial encounter: Secondary | ICD-10-CM | POA: Diagnosis not present

## 2015-05-17 DIAGNOSIS — D638 Anemia in other chronic diseases classified elsewhere: Secondary | ICD-10-CM | POA: Diagnosis not present

## 2015-05-17 DIAGNOSIS — Z86711 Personal history of pulmonary embolism: Secondary | ICD-10-CM

## 2015-05-17 DIAGNOSIS — E785 Hyperlipidemia, unspecified: Secondary | ICD-10-CM | POA: Diagnosis not present

## 2015-05-17 DIAGNOSIS — G894 Chronic pain syndrome: Secondary | ICD-10-CM

## 2015-05-17 DIAGNOSIS — C689 Malignant neoplasm of urinary organ, unspecified: Secondary | ICD-10-CM

## 2015-05-17 DIAGNOSIS — I129 Hypertensive chronic kidney disease with stage 1 through stage 4 chronic kidney disease, or unspecified chronic kidney disease: Secondary | ICD-10-CM | POA: Diagnosis not present

## 2015-05-17 DIAGNOSIS — N289 Disorder of kidney and ureter, unspecified: Secondary | ICD-10-CM

## 2015-05-17 DIAGNOSIS — N183 Chronic kidney disease, stage 3 (moderate): Secondary | ICD-10-CM | POA: Diagnosis not present

## 2015-05-17 DIAGNOSIS — I10 Essential (primary) hypertension: Secondary | ICD-10-CM

## 2015-05-17 LAB — BASIC METABOLIC PANEL
Anion gap: 12 (ref 5–15)
BUN: 8 mg/dL (ref 6–20)
CHLORIDE: 100 mmol/L — AB (ref 101–111)
CO2: 21 mmol/L — ABNORMAL LOW (ref 22–32)
CREATININE: 1.35 mg/dL — AB (ref 0.61–1.24)
Calcium: 8.8 mg/dL — ABNORMAL LOW (ref 8.9–10.3)
GFR, EST NON AFRICAN AMERICAN: 57 mL/min — AB (ref >60)
Glucose, Bld: 96 mg/dL (ref 65–99)
POTASSIUM: 3.6 mmol/L (ref 3.5–5.1)
SODIUM: 133 mmol/L — AB (ref 135–145)

## 2015-05-17 LAB — CBC WITH DIFFERENTIAL/PLATELET
BASOS ABS: 0.1 10*3/uL (ref 0.0–0.1)
Basophils Relative: 0 %
EOS ABS: 0.6 10*3/uL (ref 0.0–0.7)
EOS PCT: 5 %
HCT: 29.5 % — ABNORMAL LOW (ref 39.0–52.0)
HEMOGLOBIN: 9.7 g/dL — AB (ref 13.0–17.0)
LYMPHS ABS: 0.9 10*3/uL (ref 0.7–4.0)
Lymphocytes Relative: 6 %
MCH: 28.6 pg (ref 26.0–34.0)
MCHC: 32.9 g/dL (ref 30.0–36.0)
MCV: 87 fL (ref 78.0–100.0)
Monocytes Absolute: 2 10*3/uL — ABNORMAL HIGH (ref 0.1–1.0)
Monocytes Relative: 15 %
NEUTROS PCT: 74 %
Neutro Abs: 10.2 10*3/uL — ABNORMAL HIGH (ref 1.7–7.7)
PLATELETS: 431 10*3/uL — AB (ref 150–400)
RBC: 3.39 MIL/uL — AB (ref 4.22–5.81)
RDW: 15.5 % (ref 11.5–15.5)
WBC: 13.8 10*3/uL — AB (ref 4.0–10.5)

## 2015-05-17 LAB — URINALYSIS, ROUTINE W REFLEX MICROSCOPIC
BILIRUBIN URINE: NEGATIVE
Glucose, UA: NEGATIVE mg/dL
KETONES UR: NEGATIVE mg/dL
NITRITE: NEGATIVE
SPECIFIC GRAVITY, URINE: 1.022 (ref 1.005–1.030)
UROBILINOGEN UA: 0.2 mg/dL (ref 0.0–1.0)
pH: 7.5 (ref 5.0–8.0)

## 2015-05-17 LAB — COMPREHENSIVE METABOLIC PANEL
ALBUMIN: 2.2 g/dL — AB (ref 3.5–5.0)
ALT: 7 U/L — AB (ref 17–63)
AST: 14 U/L — AB (ref 15–41)
Alkaline Phosphatase: 74 U/L (ref 38–126)
Anion gap: 11 (ref 5–15)
BUN: 7 mg/dL (ref 6–20)
CHLORIDE: 100 mmol/L — AB (ref 101–111)
CO2: 20 mmol/L — AB (ref 22–32)
CREATININE: 1.29 mg/dL — AB (ref 0.61–1.24)
Calcium: 8.5 mg/dL — ABNORMAL LOW (ref 8.9–10.3)
GFR calc Af Amer: >60 mL/min (ref >60)
GFR, EST NON AFRICAN AMERICAN: 60 mL/min — AB (ref >60)
Glucose, Bld: 86 mg/dL (ref 65–99)
POTASSIUM: 3.2 mmol/L — AB (ref 3.5–5.1)
SODIUM: 131 mmol/L — AB (ref 135–145)
Total Bilirubin: 1.2 mg/dL (ref 0.3–1.2)
Total Protein: 6.6 g/dL (ref 6.5–8.1)

## 2015-05-17 LAB — CBC
HEMATOCRIT: 27.6 % — AB (ref 39.0–52.0)
Hemoglobin: 8.9 g/dL — ABNORMAL LOW (ref 13.0–17.0)
MCH: 28.2 pg (ref 26.0–34.0)
MCHC: 32.2 g/dL (ref 30.0–36.0)
MCV: 87.3 fL (ref 78.0–100.0)
PLATELETS: 392 10*3/uL (ref 150–400)
RBC: 3.16 MIL/uL — ABNORMAL LOW (ref 4.22–5.81)
RDW: 15.5 % (ref 11.5–15.5)
WBC: 12.7 10*3/uL — AB (ref 4.0–10.5)

## 2015-05-17 LAB — LIPID PANEL
CHOLESTEROL: 114 mg/dL (ref 0–200)
HDL: 42 mg/dL (ref >40)
LDL Cholesterol: 50 mg/dL (ref 0–99)
TRIGLYCERIDES: 111 mg/dL (ref <150)
Total CHOL/HDL Ratio: 2.7 RATIO
VLDL: 22 mg/dL (ref 0–40)

## 2015-05-17 LAB — PROTIME-INR
INR: 1.37 (ref 0.00–1.49)
Prothrombin Time: 17 seconds — ABNORMAL HIGH (ref 11.6–15.2)

## 2015-05-17 LAB — URINE MICROSCOPIC-ADD ON

## 2015-05-17 LAB — GLUCOSE, CAPILLARY: Glucose-Capillary: 90 mg/dL (ref 65–99)

## 2015-05-17 LAB — APTT: APTT: 41 s — AB (ref 24–37)

## 2015-05-17 MED ORDER — FENTANYL 50 MCG/HR TD PT72: 50.0000 ug | MEDICATED_PATCH | TRANSDERMAL | Status: DC

## 2015-05-17 MED ORDER — BISACODYL 10 MG RE SUPP: 10.0000 mg | Freq: Every day | RECTAL | Status: DC | PRN

## 2015-05-17 MED ORDER — IOHEXOL 300 MG/ML  SOLN
20.0000 mL | Freq: Once | INTRAMUSCULAR | Status: DC | PRN
  Administered 2015-05-17: 20 mL
  Filled 2015-05-17: qty 20

## 2015-05-17 MED ORDER — POLYETHYLENE GLYCOL 3350 17 G PO PACK: 17.0000 g | PACK | Freq: Every day | ORAL | Status: DC | PRN

## 2015-05-17 MED ORDER — HYDROMORPHONE HCL 2 MG PO TABS
2.0000 mg | ORAL_TABLET | Freq: Once | ORAL | Status: AC
  Administered 2015-05-17: 2 mg via ORAL
  Filled 2015-05-17: qty 1

## 2015-05-17 MED ORDER — LORAZEPAM 0.5 MG PO TABS
0.5000 mg | ORAL_TABLET | Freq: Two times a day (BID) | ORAL | Status: DC
  Administered 2015-05-17 – 2015-05-18 (×2): 0.5 mg via ORAL
  Filled 2015-05-17 (×3): qty 1

## 2015-05-17 MED ORDER — FENTANYL CITRATE (PF) 100 MCG/2ML IJ SOLN
INTRAMUSCULAR | Status: AC
  Filled 2015-05-17: qty 2

## 2015-05-17 MED ORDER — DEXTROSE 50 % IV SOLN
INTRAVENOUS | Status: AC
  Filled 2015-05-17: qty 50

## 2015-05-17 MED ORDER — MAGNESIUM HYDROXIDE 400 MG/5ML PO SUSP: 30.0000 mL | Freq: Every day | ORAL | Status: DC | PRN

## 2015-05-17 MED ORDER — HEPARIN SODIUM (PORCINE) 5000 UNIT/ML IJ SOLN: 5000.0000 [IU] | Freq: Three times a day (TID) | INTRAMUSCULAR | Status: DC

## 2015-05-17 MED ORDER — ALUM & MAG HYDROXIDE-SIMETH 200-200-20 MG/5ML PO SUSP: 20.0000 mL | Freq: Four times a day (QID) | ORAL | Status: DC | PRN

## 2015-05-17 MED ORDER — ACETAMINOPHEN 325 MG PO TABS: 650.0000 mg | ORAL_TABLET | Freq: Four times a day (QID) | ORAL | Status: DC | PRN

## 2015-05-17 MED ORDER — LORAZEPAM 2 MG/ML IJ SOLN
0.5000 mg | Freq: Once | INTRAMUSCULAR | Status: AC
  Administered 2015-05-17: 0.5 mg via INTRAVENOUS

## 2015-05-17 MED ORDER — FERROUS SULFATE 325 (65 FE) MG PO TABS
325.0000 mg | ORAL_TABLET | Freq: Two times a day (BID) | ORAL | Status: DC
  Administered 2015-05-18: 325 mg via ORAL
  Filled 2015-05-17 (×3): qty 1

## 2015-05-17 MED ORDER — DULOXETINE HCL 30 MG PO CPEP
30.0000 mg | ORAL_CAPSULE | Freq: Every day | ORAL | Status: DC
  Administered 2015-05-17 – 2015-05-18 (×2): 30 mg via ORAL
  Filled 2015-05-17 (×2): qty 1

## 2015-05-17 MED ORDER — ZOLPIDEM TARTRATE 5 MG PO TABS: 5.0000 mg | ORAL_TABLET | Freq: Every evening | ORAL | Status: DC | PRN

## 2015-05-17 MED ORDER — SODIUM CHLORIDE 0.9 % IJ SOLN
10.0000 mL | Freq: Two times a day (BID) | INTRAMUSCULAR | Status: DC
  Administered 2015-05-18: 10 mL

## 2015-05-17 MED ORDER — VITAMIN B-12 1000 MCG PO TABS
1000.0000 ug | ORAL_TABLET | Freq: Every day | ORAL | Status: DC
  Administered 2015-05-17 – 2015-05-18 (×2): 1000 ug via ORAL
  Filled 2015-05-17 (×2): qty 1

## 2015-05-17 MED ORDER — LAMOTRIGINE 200 MG PO TABS
200.0000 mg | ORAL_TABLET | Freq: Two times a day (BID) | ORAL | Status: DC
  Administered 2015-05-17 – 2015-05-18 (×3): 200 mg via ORAL
  Filled 2015-05-17 (×4): qty 1

## 2015-05-17 MED ORDER — ONDANSETRON HCL 4 MG PO TABS
4.0000 mg | ORAL_TABLET | Freq: Four times a day (QID) | ORAL | Status: DC | PRN
Start: 2015-05-17 — End: 2015-05-18

## 2015-05-17 MED ORDER — LIDOCAINE HCL 1 % IJ SOLN
INTRAMUSCULAR | Status: AC
  Filled 2015-05-17: qty 20

## 2015-05-17 MED ORDER — FLEET ENEMA 7-19 GM/118ML RE ENEM: 1.0000 | ENEMA | Freq: Every day | RECTAL | Status: DC | PRN

## 2015-05-17 MED ORDER — ONDANSETRON HCL 4 MG/2ML IJ SOLN
4.0000 mg | Freq: Four times a day (QID) | INTRAMUSCULAR | Status: DC | PRN
  Filled 2015-05-17: qty 2

## 2015-05-17 MED ORDER — NITROGLYCERIN 0.4 MG SL SUBL: 0.4000 mg | SUBLINGUAL_TABLET | SUBLINGUAL | Status: DC | PRN

## 2015-05-17 MED ORDER — FOLIC ACID 1 MG PO TABS
1.0000 mg | ORAL_TABLET | Freq: Every day | ORAL | Status: DC
  Administered 2015-05-17 – 2015-05-18 (×2): 1 mg via ORAL
  Filled 2015-05-17 (×2): qty 1

## 2015-05-17 MED ORDER — ATORVASTATIN CALCIUM 10 MG PO TABS
10.0000 mg | ORAL_TABLET | Freq: Every day | ORAL | Status: DC
  Administered 2015-05-17: 10 mg via ORAL
  Filled 2015-05-17: qty 1

## 2015-05-17 MED ORDER — SODIUM CHLORIDE 0.9 % IV SOLN
INTRAVENOUS | Status: DC
  Administered 2015-05-17 – 2015-05-18 (×3): via INTRAVENOUS

## 2015-05-17 MED ORDER — GUAIFENESIN 100 MG/5ML PO SOLN: 15.0000 mL | Freq: Three times a day (TID) | ORAL | Status: DC | PRN

## 2015-05-17 MED ORDER — SENNOSIDES-DOCUSATE SODIUM 8.6-50 MG PO TABS
1.0000 | ORAL_TABLET | Freq: Every day | ORAL | Status: DC
  Filled 2015-05-17: qty 1

## 2015-05-17 MED ORDER — CHOLECALCIFEROL 1.25 MG (50000 UT) PO CAPS
50000.0000 [IU] | ORAL_CAPSULE | ORAL | Status: DC
Start: 2015-05-29 — End: 2015-05-18

## 2015-05-17 MED ORDER — PROMETHAZINE HCL 25 MG/ML IJ SOLN: 25.0000 mg | INTRAMUSCULAR | Status: DC | PRN

## 2015-05-17 MED ORDER — METOCLOPRAMIDE HCL 10 MG PO TABS
5.0000 mg | ORAL_TABLET | Freq: Three times a day (TID) | ORAL | Status: DC
  Administered 2015-05-17 – 2015-05-18 (×3): 5 mg via ORAL
  Filled 2015-05-17 (×3): qty 1

## 2015-05-17 MED ORDER — METOPROLOL TARTRATE 50 MG PO TABS
50.0000 mg | ORAL_TABLET | Freq: Two times a day (BID) | ORAL | Status: DC
  Administered 2015-05-17 – 2015-05-18 (×3): 50 mg via ORAL
  Filled 2015-05-17 (×3): qty 1

## 2015-05-17 MED ORDER — PANTOPRAZOLE SODIUM 40 MG PO TBEC
40.0000 mg | DELAYED_RELEASE_TABLET | Freq: Every day | ORAL | Status: DC
  Administered 2015-05-17 – 2015-05-18 (×2): 40 mg via ORAL
  Filled 2015-05-17 (×2): qty 1

## 2015-05-17 MED ORDER — SODIUM CHLORIDE 0.9 % IJ SOLN
10.0000 mL | INTRAMUSCULAR | Status: DC | PRN
  Administered 2015-05-17: 10 mL
  Filled 2015-05-17: qty 40

## 2015-05-17 MED ORDER — MIRTAZAPINE 15 MG PO TABS
15.0000 mg | ORAL_TABLET | Freq: Every day | ORAL | Status: DC
  Administered 2015-05-17: 15 mg via ORAL
  Filled 2015-05-17: qty 1

## 2015-05-17 MED ORDER — MORPHINE SULFATE (PF) 2 MG/ML IV SOLN
2.0000 mg | INTRAVENOUS | Status: DC | PRN
Start: 2015-05-17 — End: 2015-05-17
  Administered 2015-05-17: 2 mg via INTRAVENOUS
  Filled 2015-05-17: qty 1

## 2015-05-17 MED ORDER — PROCHLORPERAZINE MALEATE 10 MG PO TABS
10.0000 mg | ORAL_TABLET | Freq: Four times a day (QID) | ORAL | Status: DC | PRN
  Administered 2015-05-18: 10 mg via ORAL
  Filled 2015-05-17: qty 1

## 2015-05-17 MED ORDER — POLYVINYL ALCOHOL 1.4 % OP SOLN
2.0000 [drp] | Freq: Three times a day (TID) | OPHTHALMIC | Status: DC
  Administered 2015-05-17 – 2015-05-18 (×4): 2 [drp] via OPHTHALMIC
  Filled 2015-05-17: qty 15

## 2015-05-17 MED ORDER — SODIUM CHLORIDE 0.9 % IJ SOLN: 3.0000 mL | Freq: Two times a day (BID) | INTRAMUSCULAR | Status: DC

## 2015-05-17 MED ORDER — ENOXAPARIN SODIUM 40 MG/0.4ML ~~LOC~~ SOLN
40.0000 mg | SUBCUTANEOUS | Status: DC
  Administered 2015-05-17: 40 mg via SUBCUTANEOUS
  Filled 2015-05-17: qty 0.4

## 2015-05-17 MED ORDER — MIDAZOLAM HCL 2 MG/2ML IJ SOLN
INTRAMUSCULAR | Status: AC
  Filled 2015-05-17: qty 2

## 2015-05-17 MED ORDER — LACTULOSE 10 GM/15ML PO SOLN
10.0000 g | Freq: Three times a day (TID) | ORAL | Status: DC
  Filled 2015-05-17 (×3): qty 30

## 2015-05-17 MED ORDER — FENTANYL CITRATE (PF) 100 MCG/2ML IJ SOLN
INTRAMUSCULAR | Status: AC | PRN
  Administered 2015-05-17: 25 ug via INTRAVENOUS

## 2015-05-17 MED ORDER — HYDROMORPHONE HCL 2 MG PO TABS
2.0000 mg | ORAL_TABLET | ORAL | Status: DC | PRN
  Administered 2015-05-17 – 2015-05-18 (×6): 4 mg via ORAL
  Filled 2015-05-17 (×6): qty 2

## 2015-05-17 MED ORDER — MIDAZOLAM HCL 2 MG/2ML IJ SOLN
INTRAMUSCULAR | Status: AC | PRN
  Administered 2015-05-17: 1 mg via INTRAVENOUS
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
## 2015-05-17 NOTE — ED Notes (Signed)
Admitting MD at bedside.

## 2015-05-17 NOTE — Progress Notes (Signed)
PT Cancellation Note  Patient Details Name: Samuel [name] MRN: [id number] DOB: 1956/03/04   Cancelled Treatment:    Reason Eval/Treat Not Completed: Medical issues which prohibited therapy;Pain limiting ability to participate (pt had B nephrostomy tube placement procedure this morning, he is sore and groggy from this and requested no PT today. Will attempt tomorrow. )   [name] 05/17/2015, 1:02 PM [phone]

## 2015-05-17 NOTE — ED Notes (Signed)
Pt awake and alert watching TV

## 2015-05-17 NOTE — Procedures (Signed)
Left nephrostomy insertion Right neph tube exchg  No comp Stable Full report in PACS

## 2015-05-17 NOTE — Progress Notes (Signed)
1.5mg /0.69mL of ativan was wasted and witnessed by Margaretmary Dys, RN.

## 2015-05-17 NOTE — Progress Notes (Signed)
CSW received referral that pt admitted from [facility].  CSW attempted to meet with pt at bedside to complete assessment. Pt sleeping soundly and and did not awake to CSW knocking on the door and calling pt name.   CSW to attempt to assess pt at a later time.  CSW to continue to follow.  Alison Murray, MSW, [place] [facility] [phone]

## 2015-05-18 DIAGNOSIS — R569 Unspecified convulsions: Secondary | ICD-10-CM

## 2015-05-18 DIAGNOSIS — N183 Chronic kidney disease, stage 3 (moderate): Secondary | ICD-10-CM

## 2015-05-18 DIAGNOSIS — T83022A Displacement of nephrostomy catheter, initial encounter: Secondary | ICD-10-CM | POA: Diagnosis not present

## 2015-05-18 DIAGNOSIS — D638 Anemia in other chronic diseases classified elsewhere: Secondary | ICD-10-CM | POA: Diagnosis not present

## 2015-05-18 DIAGNOSIS — F329 Major depressive disorder, single episode, unspecified: Secondary | ICD-10-CM

## 2015-05-18 DIAGNOSIS — G894 Chronic pain syndrome: Secondary | ICD-10-CM | POA: Diagnosis not present

## 2015-05-18 LAB — GLUCOSE, CAPILLARY: GLUCOSE-CAPILLARY: 77 mg/dL (ref 65–99)

## 2015-05-18 MED ORDER — HEPARIN SOD (PORK) LOCK FLUSH 100 UNIT/ML IV SOLN
500.0000 [IU] | INTRAVENOUS | Status: AC | PRN
  Administered 2015-05-18: 500 [IU]

## 2015-05-18 MED ORDER — FENTANYL 50 MCG/HR TD PT72: 50.0000 ug | MEDICATED_PATCH | TRANSDERMAL | Status: AC

## 2015-05-18 MED ORDER — LORAZEPAM 0.5 MG PO TABS: 0.5000 mg | ORAL_TABLET | Freq: Two times a day (BID) | ORAL | Status: DC

## 2015-05-18 MED ORDER — ZOLPIDEM TARTRATE 5 MG PO TABS: 5.0000 mg | ORAL_TABLET | Freq: Every evening | ORAL | Status: AC | PRN

## 2015-05-18 MED ORDER — HYDROMORPHONE HCL 2 MG PO TABS: 2.0000 mg | ORAL_TABLET | ORAL | Status: DC | PRN

## 2015-05-18 NOTE — Progress Notes (Signed)
OT Cancellation Note  Patient Details Name: Samuel [name] MRN: [id number] DOB: 1955-10-23   Cancelled Treatment:    Reason Eval/Treat Not Completed: Other (comment) Pt is total care at baseline. Will defer OT eval at this time.  Jules Schick  [phone] 05/18/2015, 12:03 PM

## 2015-05-18 NOTE — Progress Notes (Signed)
Pt for discharge to [facility].  CSW facilitated pt discharge needs including contacting facility, faxing pt discharge information via [facility], discussing with pt at bedside and pt daughter, [name] via telephone, providing RN phone number to call report, and arranging ambulance transport for pt back to [facility].  No further social work needs identified at this time.  CSW signing off.   Alison Murray, MSW, [place] Work [phone]
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
## 2015-05-18 NOTE — Clinical Social Work Note (Signed)
Clinical Social Work Assessment  Patient Details  Name: Samuel [name] MRN: [id number] Date of Birth: 10-02-1955  Date of referral:  05/18/15               Reason for consult:  Discharge Planning                Permission sought to share information with:  Family Supports Permission granted to share information::  Yes, Verbal Permission Granted  Name::     Samuel [name]  Agency::     Relationship::  daughter  Contact Information:  [phone]  Housing/Transportation Living arrangements for the past 2 months:  [place] of Information:  Patient, Adult Children Patient Interpreter Needed:  None Criminal Activity/Legal Involvement Pertinent to Current Situation/Hospitalization:  No - Comment as needed Significant Relationships:  Adult Children Lives with:  Facility Resident Do you feel safe going back to the place where you live?  Yes Need for family participation in patient care:  No (Coment)  Care giving concerns:  Pt admitted from [place]. Pt expressed concern about care however, pt discharge today and during last admission a few days ago other options were explored and no bed available for pt at other options.    Social Worker assessment / plan:  CSW received referral that pt admitted from [facility] where pt is a long term resident.  Pt has discharge pt today.  CSW met with pt at bedside to notify pt of discharge and discuss process of return to [place]. Pt expressed that he does not feel they take care of him at [facility]. CSW provided support and explained that pt is ready for discharge today and pt and pt family will have to seek alternative placement from [facility]. CSW checked with [facility], but facility does not have private room that pt requires. Pt was hopeful for transfer to [facility]. Pt aware of return to [place] today. Pt provided permission for CSW to contact  pt daughter, Samuel [name].  CSW contacted pt daughter, Samuel [name] and notified pt daughter that pt discharging back to [place] today. CSW notified pt daughter that CSW did check with [facility], but facility does not have private room that pt requires. CSW encouraged pt daughter to continue to call [facility] regarding pt. Pt daughter expressed understanding.   CSW to facilitate pt discharge needs this afternoon.  Employment status:  Disabled (Comment on whether or not currently receiving Disability) Insurance information:  Programmer, applications, Medicaid In [name] PT Recommendations:  Not assessed at this time Information / Referral to community resources:  [place]  Patient/Family's Response to care:  Pt alert and oriented x 4. Pt anxious about returning to [place] and CSW explained that with pt being medically ready for discharge today that there are no other options for pt at this time.   Patient/Family's Understanding of and Emotional Response to Diagnosis, Current Treatment, and Prognosis:  Pt aware of discharge today and [facility] as there are no other options available at this time.  Emotional Assessment Appearance:  Appears stated age Attitude/Demeanor/Rapport:  Other (pt anxious) Affect (typically observed):  Anxious Orientation:  Oriented to Self, Oriented to Place, Oriented to  Time, Oriented to Situation Alcohol / Substance use:  Not Applicable Psych involvement (Current and /or in the community):  No (Comment)  Discharge Needs  Concerns to be addressed:  Discharge Planning Concerns Readmission within the last  30 days:  No Current discharge risk:  None Barriers to Discharge:  No Barriers Identified   [place], [place], LCSW 05/18/2015, 10:02 AM [phone]

## 2015-05-21 ENCOUNTER — Non-Acute Institutional Stay (SKILLED_NURSING_FACILITY): Payer: Medicare Other | Admitting: Internal Medicine

## 2015-05-21 ENCOUNTER — Encounter: Payer: Self-pay | Admitting: Internal Medicine

## 2015-05-21 DIAGNOSIS — T83022A Displacement of nephrostomy catheter, initial encounter: Secondary | ICD-10-CM

## 2015-05-21 DIAGNOSIS — N183 Chronic kidney disease, stage 3 unspecified: Secondary | ICD-10-CM

## 2015-05-21 DIAGNOSIS — R569 Unspecified convulsions: Secondary | ICD-10-CM | POA: Diagnosis not present

## 2015-05-21 DIAGNOSIS — G894 Chronic pain syndrome: Secondary | ICD-10-CM

## 2015-05-21 DIAGNOSIS — F329 Major depressive disorder, single episode, unspecified: Secondary | ICD-10-CM | POA: Diagnosis not present

## 2015-05-21 DIAGNOSIS — C689 Malignant neoplasm of urinary organ, unspecified: Secondary | ICD-10-CM

## 2015-05-21 DIAGNOSIS — F32A Depression, unspecified: Secondary | ICD-10-CM

## 2015-05-21 DIAGNOSIS — I10 Essential (primary) hypertension: Secondary | ICD-10-CM

## 2015-05-21 DIAGNOSIS — Z89612 Acquired absence of left leg above knee: Secondary | ICD-10-CM | POA: Diagnosis not present

## 2015-05-21 NOTE — Progress Notes (Signed)
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 Right Kidney: Length: 13.2 cm. Limited visualization of the right kidney due to rib shadowing and body habitus. There is moderate hydronephrosis. An echogenic focus is demonstrated in the upper mid pole of the right kidney probably representing the nephrostomy tube. Location appears shallower than on the prior study and the catheter may have backed out of the renal pelvis since previous CT. Left Kidney: Length: 9.7 cm. Mild diffuse renal parenchymal thinning. No hydronephrosis. Nephrostomy tube is not identified. Bladder: Bladder is decompressed with Foley catheter and is not visualized. IMPRESSION: Moderate right hydronephrosis. Echogenic focus demonstrated consistent with the nephrostomy tube. Appears to be located in upper pole calyx. Left kidney demonstrates diffuse parenchymal thinning without hydronephrosis.  Nephrostomy tube is not identified. Electronically Signed   By: Lucienne Capers M.D.   On: 05/11/2015 23:35   Dg Chest Port 1 View  05/02/2015  CLINICAL DATA:  Fever and myalgias EXAM: PORTABLE CHEST 1 VIEW COMPARISON:  04/23/2015 FINDINGS: There is persistent right apical opacity with more confluent consolidation compared to 04/23/2015. Linear basilar opacities have worsened bilaterally. There is unchanged right hemidiaphragm elevation. IMPRESSION: Worsening right upper lobe consolidation. Electronically Signed   By: Andreas Newport M.D.   On: 05/02/2015 00:24   Dg Abd Portable 1v  05/03/2015  CLINICAL DATA:  Vomiting. EXAM: PORTABLE ABDOMEN - 1 VIEW COMPARISON:  CT scan 04/17/2015 FINDINGS: Bilateral nephrostomy tubes are again demonstrated. There is moderate stool throughout the colon which may suggest constipation. No findings for small bowel obstruction or free air. Left lower quadrant colostomy is noted. IMPRESSION: Moderate stool throughout the colon suggesting constipation. No findings for small bowel obstruction or free air. Electronically Signed   By: Marijo Sanes M.D.   On: 05/03/2015 10:59   Ir Nephrostomy Placement Left  05/17/2015  CLINICAL DATA:  Chronic external nephrostomies, right nephrostomy has been pulled out. Left nephrostomy is retracted. EXAM: Right nephrostomy catheter replacement through the existing percutaneous tract. Ultrasound and fluoroscopically guided left nephrostomy insertion Date:  10/20/201610/20/2016 10:58 am Radiologist:  M. Daryll Brod, MD Guidance:  Ultrasound and fluoroscopic FLUOROSCOPY TIME:  6 minutes 48 seconds, 359 mGy MEDICATIONS AND MEDICAL HISTORY: 1 mg Versed, 25 mcg fentanyl, 1 mg Ativan ANESTHESIA/SEDATION: 5 minutes CONTRAST:  10 cc COMPLICATIONS: None immediate PROCEDURE: Informed consent was obtained from the patient following explanation of the procedure, risks, benefits and alternatives. The patient understands, agrees and consents for the  procedure. All questions were addressed. A time out was performed. Maximal barrier sterile technique utilized including caps, mask, sterile gowns, sterile gloves, large sterile drape, hand hygiene, and Betadine. Right nephrostomy catheter replacement through the existing percutaneous tract: Under sterile conditions and local anesthesia, a Kumpe catheter was advanced through the existing right percutaneous nephrostomy tract and advanced into the renal pelvis. There was return of urine. Contrast injection confirms position. Amplatz guidewire inserted followed by advancement of a 10 French nephrostomy catheter. Retention loop formed in the renal pelvis. Images obtained for documentation. Catheter secured with a Prolene suture. Sterile dressing applied. Left nephrostomy insertion: Several attempts were made to recannulate the left percutaneous tract however this was unsuccessful in accessing the collecting system. Ultrasound confirms hydronephrosis. Under sterile conditions and local anesthesia, an 18 gauge 15 cm needle was advanced into a mid pole dilated calyx. Images obtained for documentation. Amplatz guidewire inserted followed by tract dilatation to insert a new 10 [place]  nephrostomy. Retention loop formed in the renal pelvis. Position confirmed with contrast injection and fluoroscopic imaging. There was return of clear urine. Catheter secured with Prolene suture and a sterile dressing. IMPRESSION: Successful replacement of the right nephrostomy catheter through the existing percutaneous tract. Successful ultrasound and fluoroscopic left nephrostomy insertion. Electronically Signed   By: Jerilynn Mages.  Shick M.D.   On: 05/17/2015 11:38   Ir Nephrostomy Exchange Left  05/12/2015  CLINICAL DATA:  Left nephrostomy pulled back. Right nephrostomy pulled out. EXAM: IR NEPHROURETERAL CATH PLACEMENT RIGHT; PERC TUBE CHG W/CM FLUOROSCOPY TIME:  1 minutes and 24 seconds MEDICATIONS AND MEDICAL HISTORY: Versed 2 mg, Fentanyl 100  mcg. Additional Medications: None. ANESTHESIA/SEDATION: Moderate sedation time: 16 minutes CONTRAST:  10 cc Omnipaque 300 PROCEDURE: The procedure, risks, benefits, and alternatives were explained to the patient. Questions regarding the procedure were encouraged and answered. The patient understands and consents to the procedure. The back was prepped with Betadine in a sterile fashion, and a sterile drape was applied covering the operative field. A sterile gown and sterile gloves were used for the procedure. The left nephrostomy catheter was injected with contrast. It was cut and exchanged over a Bentson wire for a 10 French nephrostomy. It was looped and string fixed in the renal pelvis then sewn to the skin. Contrast was injected A copy catheter was inserted into the right nephrostomy insertion site. A Bentson wire was carefully advanced through the copy catheter and into the renal pelvis. The copy catheter was advanced over the Bentson wire into the right renal pelvis. Contrast was injected. The copy was then exchanged for a 10 French nephrostomy over the Bentson wire. It was looped and string fixed in the right renal pelvis then sewn to the skin. Contrast was injected. FINDINGS: Imaging over the left kidney demonstrates that the tip of the left nephrostomy is barely in the collecting system. The subsequent imaging demonstrates exchange of the left nephrostomy for a new nephrostomy catheter which is coiled in the renal pelvis. Images over the right kidney demonstrating new right nephrostomy catheter with its tip coiled in the renal pelvis. COMPLICATIONS: None IMPRESSION: Successful left nephrostomy catheter exchange and right nephrostomy catheter replacement. Electronically Signed   By: Marybelle Killings M.D.   On: 05/12/2015 14:54   Ir Nephrostomy Exchange Right  05/17/2015  CLINICAL DATA:  Chronic external nephrostomies, right nephrostomy has been pulled out. Left nephrostomy is retracted. EXAM: Right  nephrostomy catheter replacement through the existing percutaneous tract. Ultrasound and fluoroscopically guided left nephrostomy insertion Date:  10/20/201610/20/2016 10:58 am Radiologist:  M. Daryll Brod, MD Guidance:  Ultrasound and fluoroscopic FLUOROSCOPY TIME:  6 minutes 48 seconds, 359 mGy MEDICATIONS AND MEDICAL HISTORY: 1 mg Versed, 25 mcg fentanyl, 1 mg Ativan ANESTHESIA/SEDATION: 5 minutes CONTRAST:  10 cc COMPLICATIONS: None immediate PROCEDURE: Informed consent was obtained from the patient following explanation of the procedure, risks, benefits and alternatives. The patient understands, agrees and consents for the procedure. All questions were addressed. A time out was performed. Maximal barrier sterile technique utilized including caps, mask, sterile gowns, sterile gloves, large sterile drape, hand hygiene, and Betadine. Right nephrostomy catheter replacement through the existing percutaneous tract: Under sterile conditions and local anesthesia, a Kumpe catheter was advanced through the existing right percutaneous nephrostomy tract and advanced into the renal pelvis. There was return of urine. Contrast injection confirms position. Amplatz guidewire inserted followed by advancement of a 10 French nephrostomy catheter. Retention loop formed in the renal pelvis. Images obtained for documentation.  Catheter secured with a Prolene suture. Sterile dressing applied. Left nephrostomy insertion: Several attempts were made to recannulate the left percutaneous tract however this was unsuccessful in accessing the collecting system. Ultrasound confirms hydronephrosis. Under sterile conditions and local anesthesia, an 18 gauge 15 cm needle was advanced into a mid pole dilated calyx. Images obtained for documentation. Amplatz guidewire inserted followed by tract dilatation to insert a new 10 French nephrostomy. Retention loop formed in the renal pelvis. Position confirmed with contrast injection and fluoroscopic  imaging. There was return of clear urine. Catheter secured with Prolene suture and a sterile dressing. IMPRESSION: Successful replacement of the right nephrostomy catheter through the existing percutaneous tract. Successful ultrasound and fluoroscopic left nephrostomy insertion. Electronically Signed   By: Jerilynn Mages.  Shick M.D.   On: 05/17/2015 11:38   Ir Nephrostomy Exchange Right  05/12/2015  CLINICAL DATA:  Left nephrostomy pulled back. Right nephrostomy pulled out. EXAM: IR NEPHROURETERAL CATH PLACEMENT RIGHT; PERC TUBE CHG W/CM FLUOROSCOPY TIME:  1 minutes and 24 seconds MEDICATIONS AND MEDICAL HISTORY: Versed 2 mg, Fentanyl 100 mcg. Additional Medications: None. ANESTHESIA/SEDATION: Moderate sedation time: 16 minutes CONTRAST:  10 cc Omnipaque 300 PROCEDURE: The procedure, risks, benefits, and alternatives were explained to the patient. Questions regarding the procedure were encouraged and answered. The patient understands and consents to the procedure. The back was prepped with Betadine in a sterile fashion, and a sterile drape was applied covering the operative field. A sterile gown and sterile gloves were used for the procedure. The left nephrostomy catheter was injected with contrast. It was cut and exchanged over a Bentson wire for a 10 French nephrostomy. It was looped and string fixed in the renal pelvis then sewn to the skin. Contrast was injected A copy catheter was inserted into the right nephrostomy insertion site. A Bentson wire was carefully advanced through the copy catheter and into the renal pelvis. The copy catheter was advanced over the Bentson wire into the right renal pelvis. Contrast was injected. The copy was then exchanged for a 10 French nephrostomy over the Bentson wire. It was looped and string fixed in the right renal pelvis then sewn to the skin. Contrast was injected. FINDINGS: Imaging over the left kidney demonstrates that the tip of the left nephrostomy is barely in the collecting  system. The subsequent imaging demonstrates exchange of the left nephrostomy for a new nephrostomy catheter which is coiled in the renal pelvis. Images over the right kidney demonstrating new right nephrostomy catheter with its tip coiled in the renal pelvis. COMPLICATIONS: None IMPRESSION: Successful left nephrostomy catheter exchange and right nephrostomy catheter replacement. Electronically Signed   By: Marybelle Killings M.D.   On: 05/12/2015 14:54     Assessment/Plan   ICD-9-CM ICD-10-CM   1. Chronic pain syndrome - stable overall 338.4 G89.4   2. Urothelial cancer (HCC)   189.8 C68.9   3. Nephrostomy tube displaced ([place]) 997.5 T83.022A    Right replaced; left exchanged   4. Essential hypertension stable 401.9 I10   5. Depression - borderline controlled 311 F32.9   6. CKD (chronic kidney disease) stage 3, GFR 30-59 ml/min - stable 585.3 N18.3   7. Seizure ([place]) - controlled 780.39 R56.9   8. Hx of AKA (above knee amputation), left (HCC) V49.76 Z89.612     No change in pain med - he appears overly sedated today  Cont current meds as ordered  PT/OT/ST as indicated  nutritional supplements as ordered  Wound care as indicated  Foley  cath care as indicated  T/c palliative care due to poor prognosis. Pt has refused care in the past  Cont contact isolation  GOAL: short term rehab and d/c home when medically appropriate. Communicated with pt and nursing.  Nicholaos Schippers S. Perlie Gold  [facility] [address] [place],  [address] [phone] Cell (Monday-Friday 8 AM - 5 PM) [phone] After 5 PM and follow prompts

## 2015-05-24 ENCOUNTER — Other Ambulatory Visit (HOSPITAL_COMMUNITY): Payer: Self-pay | Admitting: Interventional Radiology

## 2015-05-24 DIAGNOSIS — C679 Malignant neoplasm of bladder, unspecified: Secondary | ICD-10-CM

## 2015-06-08 ENCOUNTER — Encounter (HOSPITAL_COMMUNITY): Payer: Self-pay | Admitting: Emergency Medicine

## 2015-06-08 ENCOUNTER — Emergency Department (HOSPITAL_COMMUNITY)

## 2015-06-08 ENCOUNTER — Inpatient Hospital Stay (HOSPITAL_COMMUNITY)
Admission: EM | Admit: 2015-06-08 | Discharge: 2015-06-12 | DRG: 871 | Disposition: A | Discharge status: Hospice | Attending: Internal Medicine | Admitting: Internal Medicine

## 2015-06-08 DIAGNOSIS — Z515 Encounter for palliative care: Secondary | ICD-10-CM | POA: Diagnosis present

## 2015-06-08 DIAGNOSIS — A419 Sepsis, unspecified organism: Principal | ICD-10-CM | POA: Diagnosis present

## 2015-06-08 DIAGNOSIS — G8929 Other chronic pain: Secondary | ICD-10-CM | POA: Diagnosis present

## 2015-06-08 DIAGNOSIS — Z86711 Personal history of pulmonary embolism: Secondary | ICD-10-CM

## 2015-06-08 DIAGNOSIS — Z8551 Personal history of malignant neoplasm of bladder: Secondary | ICD-10-CM | POA: Diagnosis not present

## 2015-06-08 DIAGNOSIS — Y95 Nosocomial condition: Secondary | ICD-10-CM | POA: Diagnosis present

## 2015-06-08 DIAGNOSIS — Z66 Do not resuscitate: Secondary | ICD-10-CM | POA: Diagnosis present

## 2015-06-08 DIAGNOSIS — E869 Volume depletion, unspecified: Secondary | ICD-10-CM | POA: Diagnosis present

## 2015-06-08 DIAGNOSIS — R569 Unspecified convulsions: Secondary | ICD-10-CM | POA: Diagnosis not present

## 2015-06-08 DIAGNOSIS — D631 Anemia in chronic kidney disease: Secondary | ICD-10-CM | POA: Diagnosis present

## 2015-06-08 DIAGNOSIS — Z87891 Personal history of nicotine dependence: Secondary | ICD-10-CM | POA: Diagnosis not present

## 2015-06-08 DIAGNOSIS — A4151 Sepsis due to Escherichia coli [E. coli]: Secondary | ICD-10-CM | POA: Diagnosis not present

## 2015-06-08 DIAGNOSIS — Y732 Prosthetic and other implants, materials and accessory gastroenterology and urology devices associated with adverse incidents: Secondary | ICD-10-CM | POA: Diagnosis present

## 2015-06-08 DIAGNOSIS — Z809 Family history of malignant neoplasm, unspecified: Secondary | ICD-10-CM

## 2015-06-08 DIAGNOSIS — G92 Toxic encephalopathy: Secondary | ICD-10-CM | POA: Diagnosis present

## 2015-06-08 DIAGNOSIS — I10 Essential (primary) hypertension: Secondary | ICD-10-CM | POA: Diagnosis present

## 2015-06-08 DIAGNOSIS — T426X5A Adverse effect of other antiepileptic and sedative-hypnotic drugs, initial encounter: Secondary | ICD-10-CM | POA: Diagnosis present

## 2015-06-08 DIAGNOSIS — E871 Hypo-osmolality and hyponatremia: Secondary | ICD-10-CM | POA: Diagnosis present

## 2015-06-08 DIAGNOSIS — I129 Hypertensive chronic kidney disease with stage 1 through stage 4 chronic kidney disease, or unspecified chronic kidney disease: Secondary | ICD-10-CM | POA: Diagnosis present

## 2015-06-08 DIAGNOSIS — Z933 Colostomy status: Secondary | ICD-10-CM | POA: Diagnosis not present

## 2015-06-08 DIAGNOSIS — T424X5A Adverse effect of benzodiazepines, initial encounter: Secondary | ICD-10-CM | POA: Diagnosis present

## 2015-06-08 DIAGNOSIS — D638 Anemia in other chronic diseases classified elsewhere: Secondary | ICD-10-CM | POA: Diagnosis not present

## 2015-06-08 DIAGNOSIS — N136 Pyonephrosis: Secondary | ICD-10-CM | POA: Diagnosis present

## 2015-06-08 DIAGNOSIS — D72829 Elevated white blood cell count, unspecified: Secondary | ICD-10-CM | POA: Diagnosis present

## 2015-06-08 DIAGNOSIS — Z86718 Personal history of other venous thrombosis and embolism: Secondary | ICD-10-CM | POA: Diagnosis not present

## 2015-06-08 DIAGNOSIS — E785 Hyperlipidemia, unspecified: Secondary | ICD-10-CM | POA: Diagnosis present

## 2015-06-08 DIAGNOSIS — G8220 Paraplegia, unspecified: Secondary | ICD-10-CM | POA: Diagnosis present

## 2015-06-08 DIAGNOSIS — T83022A Displacement of nephrostomy catheter, initial encounter: Secondary | ICD-10-CM | POA: Diagnosis present

## 2015-06-08 DIAGNOSIS — F419 Anxiety disorder, unspecified: Secondary | ICD-10-CM | POA: Diagnosis present

## 2015-06-08 DIAGNOSIS — G40909 Epilepsy, unspecified, not intractable, without status epilepticus: Secondary | ICD-10-CM | POA: Diagnosis present

## 2015-06-08 DIAGNOSIS — J189 Pneumonia, unspecified organism: Secondary | ICD-10-CM | POA: Diagnosis present

## 2015-06-08 DIAGNOSIS — N12 Tubulo-interstitial nephritis, not specified as acute or chronic: Secondary | ICD-10-CM | POA: Diagnosis present

## 2015-06-08 DIAGNOSIS — N179 Acute kidney failure, unspecified: Secondary | ICD-10-CM | POA: Diagnosis present

## 2015-06-08 DIAGNOSIS — N183 Chronic kidney disease, stage 3 (moderate): Secondary | ICD-10-CM | POA: Diagnosis present

## 2015-06-08 DIAGNOSIS — R509 Fever, unspecified: Secondary | ICD-10-CM | POA: Diagnosis present

## 2015-06-08 LAB — URINALYSIS W MICROSCOPIC (NOT AT ARMC)
Bilirubin Urine: NEGATIVE
GLUCOSE, UA: NEGATIVE mg/dL
Ketones, ur: NEGATIVE mg/dL
Nitrite: NEGATIVE
PH: 5.5 (ref 5.0–8.0)
PROTEIN: 100 mg/dL — AB
Specific Gravity, Urine: 1.01 (ref 1.005–1.030)
UROBILINOGEN UA: 0.2 mg/dL (ref 0.0–1.0)

## 2015-06-08 LAB — CBC WITH DIFFERENTIAL/PLATELET
Basophils Absolute: 0 10*3/uL (ref 0.0–0.1)
Basophils Relative: 0 %
Eosinophils Absolute: 0.3 10*3/uL (ref 0.0–0.7)
Eosinophils Relative: 2 %
HCT: 28 % — ABNORMAL LOW (ref 39.0–52.0)
Hemoglobin: 8.9 g/dL — ABNORMAL LOW (ref 13.0–17.0)
Lymphocytes Relative: 5 %
Lymphs Abs: 0.8 10*3/uL (ref 0.7–4.0)
MCH: 27.4 pg (ref 26.0–34.0)
MCHC: 31.8 g/dL (ref 30.0–36.0)
MCV: 86.2 fL (ref 78.0–100.0)
Monocytes Absolute: 1.1 10*3/uL — ABNORMAL HIGH (ref 0.1–1.0)
Monocytes Relative: 7 %
Neutro Abs: 14.1 10*3/uL — ABNORMAL HIGH (ref 1.7–7.7)
Neutrophils Relative %: 86 %
Platelets: 293 10*3/uL (ref 150–400)
RBC: 3.25 MIL/uL — ABNORMAL LOW (ref 4.22–5.81)
RDW: 16.2 % — ABNORMAL HIGH (ref 11.5–15.5)
WBC: 16.3 10*3/uL — ABNORMAL HIGH (ref 4.0–10.5)

## 2015-06-08 LAB — COMPREHENSIVE METABOLIC PANEL
ALBUMIN: 1.9 g/dL — AB (ref 3.5–5.0)
ALT: 14 U/L — AB (ref 17–63)
AST: 18 U/L (ref 15–41)
Alkaline Phosphatase: 123 U/L (ref 38–126)
Anion gap: 11 (ref 5–15)
BILIRUBIN TOTAL: 0.8 mg/dL (ref 0.3–1.2)
BUN: 24 mg/dL — AB (ref 6–20)
CALCIUM: 10.1 mg/dL (ref 8.9–10.3)
CO2: 18 mmol/L — ABNORMAL LOW (ref 22–32)
CREATININE: 1.92 mg/dL — AB (ref 0.61–1.24)
Chloride: 95 mmol/L — ABNORMAL LOW (ref 101–111)
GFR calc Af Amer: 43 mL/min — ABNORMAL LOW (ref >60)
GFR calc non Af Amer: 37 mL/min — ABNORMAL LOW (ref >60)
GLUCOSE: 103 mg/dL — AB (ref 65–99)
Potassium: 4.6 mmol/L (ref 3.5–5.1)
Sodium: 124 mmol/L — ABNORMAL LOW (ref 135–145)
Total Protein: 7.6 g/dL (ref 6.5–8.1)

## 2015-06-08 LAB — I-STAT CG4 LACTIC ACID, ED: Lactic Acid, Venous: 1.47 mmol/L (ref 0.5–2.0)

## 2015-06-08 LAB — LIPASE, BLOOD: LIPASE: 15 U/L (ref 11–51)

## 2015-06-08 MED ORDER — SODIUM CHLORIDE 0.9 % IV BOLUS (SEPSIS)
1000.0000 mL | Freq: Once | INTRAVENOUS | Status: AC
  Administered 2015-06-08: 1000 mL via INTRAVENOUS

## 2015-06-08 MED ORDER — SODIUM CHLORIDE 0.9 % IV BOLUS (SEPSIS): 1000.0000 mL | Freq: Once | INTRAVENOUS | Status: DC

## 2015-06-08 MED ORDER — MORPHINE SULFATE (PF) 4 MG/ML IV SOLN
4.0000 mg | Freq: Once | INTRAVENOUS | Status: AC
  Administered 2015-06-08: 4 mg via INTRAVENOUS
  Filled 2015-06-08: qty 1

## 2015-06-08 MED ORDER — VANCOMYCIN HCL IN DEXTROSE 1-5 GM/200ML-% IV SOLN
1000.0000 mg | Freq: Once | INTRAVENOUS | Status: AC
  Administered 2015-06-08: 1000 mg via INTRAVENOUS
  Filled 2015-06-08: qty 200

## 2015-06-08 MED ORDER — PIPERACILLIN-TAZOBACTAM 3.375 G IVPB 30 MIN
3.3750 g | Freq: Once | INTRAVENOUS | Status: AC
  Administered 2015-06-08: 3.375 g via INTRAVENOUS
  Filled 2015-06-08: qty 50

## 2015-06-08 MED ORDER — SODIUM CHLORIDE 0.9 % IJ SOLN
10.0000 mL | INTRAMUSCULAR | Status: DC | PRN
  Administered 2015-06-08 – 2015-06-12 (×2): 10 mL
  Filled 2015-06-08: qty 40

## 2015-06-08 NOTE — ED Notes (Signed)
Pt alert to self and and place.

## 2015-06-08 NOTE — Progress Notes (Addendum)
Called report to the ED. ED nurse called back to say patient will be going to stepdown due to change in status.

## 2015-06-08 NOTE — ED Notes (Addendum)
Unaware when symptoms started. Family came to visit pt at [facility] rehab and called EMS.NO vitals from the staff. Staff would not provide history to EMS due to not calling EMS themselves. A&O to his norm according to his family. Nephrostomy tube leaking and pt sitting in urine. Family advised staff of their concerns and they stated they were unable to do anything and did not provided care for the following 4 hours. Frequent history of UTI. 29F foley. Hx of stroke. AKA on Left side. Nephrostomy tube left flank area.

## 2015-06-08 NOTE — ED Notes (Addendum)
Several bed sores noted buttocks area. Stage 2 and stage 3.
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
## 2015-06-09 ENCOUNTER — Inpatient Hospital Stay (HOSPITAL_COMMUNITY)

## 2015-06-09 ENCOUNTER — Encounter (HOSPITAL_COMMUNITY): Payer: Self-pay | Admitting: *Deleted

## 2015-06-09 DIAGNOSIS — I10 Essential (primary) hypertension: Secondary | ICD-10-CM

## 2015-06-09 DIAGNOSIS — J189 Pneumonia, unspecified organism: Secondary | ICD-10-CM

## 2015-06-09 DIAGNOSIS — A419 Sepsis, unspecified organism: Principal | ICD-10-CM

## 2015-06-09 DIAGNOSIS — T83022A Displacement of nephrostomy catheter, initial encounter: Secondary | ICD-10-CM

## 2015-06-09 DIAGNOSIS — N179 Acute kidney failure, unspecified: Secondary | ICD-10-CM

## 2015-06-09 DIAGNOSIS — N12 Tubulo-interstitial nephritis, not specified as acute or chronic: Secondary | ICD-10-CM

## 2015-06-09 DIAGNOSIS — D638 Anemia in other chronic diseases classified elsewhere: Secondary | ICD-10-CM

## 2015-06-09 DIAGNOSIS — R569 Unspecified convulsions: Secondary | ICD-10-CM

## 2015-06-09 DIAGNOSIS — E785 Hyperlipidemia, unspecified: Secondary | ICD-10-CM

## 2015-06-09 DIAGNOSIS — D72829 Elevated white blood cell count, unspecified: Secondary | ICD-10-CM

## 2015-06-09 LAB — COMPREHENSIVE METABOLIC PANEL
ALBUMIN: 1.7 g/dL — AB (ref 3.5–5.0)
ALK PHOS: 125 U/L (ref 38–126)
ALT: 12 U/L — AB (ref 17–63)
ANION GAP: 11 (ref 5–15)
AST: 25 U/L (ref 15–41)
BILIRUBIN TOTAL: 1.6 mg/dL — AB (ref 0.3–1.2)
BUN: 20 mg/dL (ref 6–20)
CO2: 16 mmol/L — AB (ref 22–32)
Calcium: 9.4 mg/dL (ref 8.9–10.3)
Chloride: 100 mmol/L — ABNORMAL LOW (ref 101–111)
Creatinine, Ser: 1.95 mg/dL — ABNORMAL HIGH (ref 0.61–1.24)
GFR calc Af Amer: 42 mL/min — ABNORMAL LOW (ref >60)
GFR calc non Af Amer: 36 mL/min — ABNORMAL LOW (ref >60)
Glucose, Bld: 90 mg/dL (ref 65–99)
Potassium: 4.2 mmol/L (ref 3.5–5.1)
Sodium: 127 mmol/L — ABNORMAL LOW (ref 135–145)
Total Protein: 7.4 g/dL (ref 6.5–8.1)

## 2015-06-09 LAB — CBC
HEMATOCRIT: 26.6 % — AB (ref 39.0–52.0)
HEMOGLOBIN: 8.3 g/dL — AB (ref 13.0–17.0)
MCH: 27.1 pg (ref 26.0–34.0)
MCHC: 31.2 g/dL (ref 30.0–36.0)
MCV: 86.9 fL (ref 78.0–100.0)
Platelets: 248 10*3/uL (ref 150–400)
RBC: 3.06 MIL/uL — AB (ref 4.22–5.81)
RDW: 16.1 % — ABNORMAL HIGH (ref 11.5–15.5)
WBC: 17.5 10*3/uL — ABNORMAL HIGH (ref 4.0–10.5)

## 2015-06-09 LAB — MRSA PCR SCREENING: MRSA BY PCR: POSITIVE — AB

## 2015-06-09 LAB — LACTIC ACID, PLASMA: Lactic Acid, Venous: 3.1 mmol/L (ref 0.5–2.0)

## 2015-06-09 MED ORDER — SODIUM CHLORIDE 0.9 % IJ SOLN
3.0000 mL | Freq: Two times a day (BID) | INTRAMUSCULAR | Status: DC
  Administered 2015-06-10: 3 mL via INTRAVENOUS

## 2015-06-09 MED ORDER — LIDOCAINE HCL 1 % IJ SOLN
INTRAMUSCULAR | Status: AC
  Filled 2015-06-09: qty 20

## 2015-06-09 MED ORDER — PROMETHAZINE HCL 25 MG/ML IJ SOLN: 25.0000 mg | INTRAMUSCULAR | Status: DC | PRN

## 2015-06-09 MED ORDER — METHYLPREDNISOLONE SODIUM SUCC 125 MG IJ SOLR
125.0000 mg | INTRAMUSCULAR | Status: AC
  Administered 2015-06-09: 125 mg via INTRAVENOUS
  Filled 2015-06-09: qty 2

## 2015-06-09 MED ORDER — DULOXETINE HCL 30 MG PO CPEP
30.0000 mg | ORAL_CAPSULE | Freq: Every day | ORAL | Status: DC
  Administered 2015-06-09 – 2015-06-12 (×4): 30 mg via ORAL
  Filled 2015-06-09 (×4): qty 1

## 2015-06-09 MED ORDER — CHLORHEXIDINE GLUCONATE CLOTH 2 % EX PADS
6.0000 | MEDICATED_PAD | Freq: Every day | CUTANEOUS | Status: DC
  Administered 2015-06-09 – 2015-06-12 (×4): 6 via TOPICAL

## 2015-06-09 MED ORDER — SODIUM CHLORIDE 0.9 % IV SOLN: INTRAVENOUS | Status: DC

## 2015-06-09 MED ORDER — PIPERACILLIN-TAZOBACTAM 3.375 G IVPB 30 MIN
3.3750 g | Freq: Three times a day (TID) | INTRAVENOUS | Status: DC
  Filled 2015-06-09 (×3): qty 50

## 2015-06-09 MED ORDER — FERROUS SULFATE 325 (65 FE) MG PO TABS
325.0000 mg | ORAL_TABLET | Freq: Two times a day (BID) | ORAL | Status: DC
  Filled 2015-06-09: qty 1

## 2015-06-09 MED ORDER — ALUM & MAG HYDROXIDE-SIMETH 200-200-20 MG/5ML PO SUSP: 30.0000 mL | Freq: Four times a day (QID) | ORAL | Status: DC | PRN

## 2015-06-09 MED ORDER — ZOLPIDEM TARTRATE 5 MG PO TABS: 5.0000 mg | ORAL_TABLET | Freq: Every evening | ORAL | Status: DC | PRN

## 2015-06-09 MED ORDER — LACTULOSE 10 GM/15ML PO SOLN
10.0000 g | Freq: Three times a day (TID) | ORAL | Status: DC
  Administered 2015-06-10 – 2015-06-12 (×4): 10 g via ORAL
  Filled 2015-06-09 (×5): qty 15

## 2015-06-09 MED ORDER — LORAZEPAM 2 MG/ML IJ SOLN
0.5000 mg | Freq: Three times a day (TID) | INTRAMUSCULAR | Status: DC | PRN
  Administered 2015-06-09 – 2015-06-10 (×5): 0.5 mg via INTRAVENOUS
  Filled 2015-06-09 (×5): qty 1

## 2015-06-09 MED ORDER — VITAMIN D (ERGOCALCIFEROL) 1.25 MG (50000 UNIT) PO CAPS: 50000.0000 [IU] | ORAL_CAPSULE | ORAL | Status: DC

## 2015-06-09 MED ORDER — FLEET ENEMA 7-19 GM/118ML RE ENEM
1.0000 | ENEMA | Freq: Every day | RECTAL | Status: DC | PRN
  Filled 2015-06-09: qty 1

## 2015-06-09 MED ORDER — MIDAZOLAM HCL 2 MG/2ML IJ SOLN
INTRAMUSCULAR | Status: AC
  Filled 2015-06-09: qty 2

## 2015-06-09 MED ORDER — ATORVASTATIN CALCIUM 10 MG PO TABS: 10.0000 mg | ORAL_TABLET | Freq: Every day | ORAL | Status: DC

## 2015-06-09 MED ORDER — GUAIFENESIN 100 MG/5ML PO SOLN
15.0000 mL | Freq: Three times a day (TID) | ORAL | Status: DC | PRN
  Filled 2015-06-09: qty 15

## 2015-06-09 MED ORDER — HYDROMORPHONE HCL 1 MG/ML IJ SOLN
0.5000 mg | INTRAMUSCULAR | Status: DC | PRN
  Administered 2015-06-09 – 2015-06-12 (×9): 0.5 mg via INTRAVENOUS
  Filled 2015-06-09 (×9): qty 1

## 2015-06-09 MED ORDER — HEPARIN SODIUM (PORCINE) 5000 UNIT/ML IJ SOLN
5000.0000 [IU] | Freq: Three times a day (TID) | INTRAMUSCULAR | Status: DC
  Administered 2015-06-09 – 2015-06-12 (×8): 5000 [IU] via SUBCUTANEOUS
  Filled 2015-06-09 (×7): qty 1

## 2015-06-09 MED ORDER — VITAMIN B-12 1000 MCG PO TABS
1000.0000 ug | ORAL_TABLET | Freq: Every day | ORAL | Status: DC
  Administered 2015-06-09: 1000 ug via ORAL
  Filled 2015-06-09: qty 1

## 2015-06-09 MED ORDER — FOLIC ACID 1 MG PO TABS
1.0000 mg | ORAL_TABLET | Freq: Every day | ORAL | Status: DC
  Administered 2015-06-09: 1 mg via ORAL
  Filled 2015-06-09: qty 1

## 2015-06-09 MED ORDER — POLYETHYLENE GLYCOL 3350 17 G PO PACK
17.0000 g | PACK | Freq: Two times a day (BID) | ORAL | Status: DC
  Administered 2015-06-10 – 2015-06-11 (×2): 17 g via ORAL
  Filled 2015-06-09 (×5): qty 1

## 2015-06-09 MED ORDER — BISACODYL 10 MG RE SUPP: 10.0000 mg | Freq: Every day | RECTAL | Status: DC | PRN

## 2015-06-09 MED ORDER — VANCOMYCIN HCL IN DEXTROSE 1-5 GM/200ML-% IV SOLN
1000.0000 mg | INTRAVENOUS | Status: DC
  Administered 2015-06-09 – 2015-06-10 (×2): 1000 mg via INTRAVENOUS
  Filled 2015-06-09 (×2): qty 200

## 2015-06-09 MED ORDER — FENTANYL 25 MCG/HR TD PT72
50.0000 ug | MEDICATED_PATCH | TRANSDERMAL | Status: DC
  Administered 2015-06-09 – 2015-06-12 (×2): 50 ug via TRANSDERMAL
  Filled 2015-06-09 (×2): qty 2

## 2015-06-09 MED ORDER — ACETAMINOPHEN 650 MG RE SUPP: 650.0000 mg | RECTAL | Status: DC | PRN

## 2015-06-09 MED ORDER — SODIUM CHLORIDE 0.9 % IV SOLN
INTRAVENOUS | Status: DC
  Administered 2015-06-09 (×3): via INTRAVENOUS
  Administered 2015-06-10: 100 mL/h via INTRAVENOUS
  Administered 2015-06-11 (×2): via INTRAVENOUS

## 2015-06-09 MED ORDER — MAGNESIUM HYDROXIDE 400 MG/5ML PO SUSP: 30.0000 mL | Freq: Every day | ORAL | Status: DC | PRN

## 2015-06-09 MED ORDER — PANTOPRAZOLE SODIUM 40 MG PO TBEC
40.0000 mg | DELAYED_RELEASE_TABLET | Freq: Every day | ORAL | Status: DC
  Administered 2015-06-09: 40 mg via ORAL
  Filled 2015-06-09: qty 1

## 2015-06-09 MED ORDER — LORAZEPAM 2 MG/ML IJ SOLN
1.0000 mg | Freq: Once | INTRAMUSCULAR | Status: AC
  Administered 2015-06-09: 1 mg via INTRAVENOUS
  Filled 2015-06-09: qty 1

## 2015-06-09 MED ORDER — LORAZEPAM 0.5 MG PO TABS
0.5000 mg | ORAL_TABLET | Freq: Two times a day (BID) | ORAL | Status: DC
Start: 2015-06-09 — End: 2015-06-09
  Administered 2015-06-09: 0.5 mg via ORAL
  Filled 2015-06-09: qty 1

## 2015-06-09 MED ORDER — POLYVINYL ALCOHOL 1.4 % OP SOLN
2.0000 [drp] | Freq: Three times a day (TID) | OPHTHALMIC | Status: DC
  Administered 2015-06-09 – 2015-06-12 (×10): 2 [drp] via OPHTHALMIC
  Filled 2015-06-09: qty 15

## 2015-06-09 MED ORDER — SENNOSIDES-DOCUSATE SODIUM 8.6-50 MG PO TABS
1.0000 | ORAL_TABLET | Freq: Every day | ORAL | Status: DC
  Administered 2015-06-10 – 2015-06-11 (×2): 1 via ORAL
  Filled 2015-06-09 (×2): qty 1

## 2015-06-09 MED ORDER — HYDROMORPHONE HCL 1 MG/ML IJ SOLN: 1.0000 mg | INTRAMUSCULAR | Status: DC | PRN

## 2015-06-09 MED ORDER — IOHEXOL 300 MG/ML  SOLN
50.0000 mL | Freq: Once | INTRAMUSCULAR | Status: DC | PRN
  Administered 2015-06-09: 35 mL via URETHRAL
  Filled 2015-06-09: qty 50

## 2015-06-09 MED ORDER — PROCHLORPERAZINE MALEATE 10 MG PO TABS
10.0000 mg | ORAL_TABLET | Freq: Four times a day (QID) | ORAL | Status: DC | PRN
  Filled 2015-06-09: qty 1

## 2015-06-09 MED ORDER — SODIUM CHLORIDE 0.9 % IV SOLN
250.0000 mg | Freq: Four times a day (QID) | INTRAVENOUS | Status: DC
  Administered 2015-06-09 – 2015-06-11 (×9): 250 mg via INTRAVENOUS
  Filled 2015-06-09 (×11): qty 250

## 2015-06-09 MED ORDER — FENTANYL CITRATE (PF) 100 MCG/2ML IJ SOLN
INTRAMUSCULAR | Status: AC
  Filled 2015-06-09: qty 2

## 2015-06-09 MED ORDER — MIRTAZAPINE 15 MG PO TABS: 15.0000 mg | ORAL_TABLET | Freq: Every day | ORAL | Status: DC

## 2015-06-09 MED ORDER — MUPIROCIN 2 % EX OINT
1.0000 "application " | TOPICAL_OINTMENT | Freq: Two times a day (BID) | CUTANEOUS | Status: DC
  Administered 2015-06-09 – 2015-06-12 (×6): 1 via NASAL
  Filled 2015-06-09 (×2): qty 22

## 2015-06-09 MED ORDER — ONDANSETRON HCL 4 MG/2ML IJ SOLN: 4.0000 mg | Freq: Three times a day (TID) | INTRAMUSCULAR | Status: DC | PRN

## 2015-06-09 MED ORDER — NITROGLYCERIN 0.4 MG SL SUBL: 0.4000 mg | SUBLINGUAL_TABLET | SUBLINGUAL | Status: DC | PRN

## 2015-06-09 MED ORDER — ACETAMINOPHEN 325 MG PO TABS: 650.0000 mg | ORAL_TABLET | Freq: Four times a day (QID) | ORAL | Status: DC | PRN

## 2015-06-09 MED ORDER — FENTANYL CITRATE (PF) 100 MCG/2ML IJ SOLN
INTRAMUSCULAR | Status: AC | PRN
  Administered 2015-06-09: 25 ug via INTRAVENOUS

## 2015-06-09 MED ORDER — DOCUSATE SODIUM 100 MG PO CAPS
100.0000 mg | ORAL_CAPSULE | Freq: Two times a day (BID) | ORAL | Status: DC
  Administered 2015-06-09: 100 mg via ORAL
  Filled 2015-06-09: qty 1

## 2015-06-09 MED ORDER — LAMOTRIGINE 200 MG PO TABS
200.0000 mg | ORAL_TABLET | Freq: Two times a day (BID) | ORAL | Status: DC
  Administered 2015-06-09 – 2015-06-12 (×7): 200 mg via ORAL
  Filled 2015-06-09 (×11): qty 1

## 2015-06-09 MED ORDER — METOCLOPRAMIDE HCL 10 MG PO TABS
5.0000 mg | ORAL_TABLET | Freq: Three times a day (TID) | ORAL | Status: DC
  Administered 2015-06-09: 5 mg via ORAL
  Filled 2015-06-09: qty 1

## 2015-06-09 NOTE — H&P (Signed)
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 LDLCALC, TRIG, CHOLHDL, LDLDIRECT in the last 72 hours. ------------------------------------------------------------------------------------------------------------------ No results for input(s): TSH, T4TOTAL, T3FREE, THYROIDAB in the last 72 hours.  Invalid input(s): FREET3 ------------------------------------------------------------------------------------------------------------------ No results for input(s): VITAMINB12, FOLATE, FERRITIN, TIBC, IRON, RETICCTPCT in the last 72 hours.  Coagulation profile No results for input(s): INR, PROTIME in the last 168 hours.  No results for input(s): DDIMER in the last 72 hours.  Cardiac Enzymes No results for input(s): CKMB, TROPONINI, MYOGLOBIN in the last 168 hours.  Invalid input(s): CK ------------------------------------------------------------------------------------------------------------------ Invalid input(s): POCBNP   CBG: No results for input(s): GLUCAP in the last 168 hours.     EKG: Independently reviewed. Sinus Tachycardia   Assessment/Plan   Sepsis (HCC)2/2 Pyelonephritis and HCAP. Patient with a white blood cell count 16.3, tachycardia, and fever 100.8  F. UA positive and chest x-ray suggestive of bilateral pneumonia. -admit to stepdown w/ tele -Vanc and Rocephin -F/u cultures  Right Nephrostomy tube displacement (HCC)  -IR to replace displaced tube in AM -NPO -check INR and PTT  Anemia of chronic disease: Anemia secondary to chronic kidney disease and history of urothelial cancer.Hemoglobin stable is at 8.7  And baseline somewher around 9. -Follow-up by CBC -transfuse PRBC if needed.   Leukocytosis: Acute on Chronic issue. 2/2 infection. -follow up CBC  AKI on CKD (chronic kidney disease) stage 3: Recent baseline creatinine is 1.1-1.6, his creatinine is 1.95 BUN 8, which is close to baseline. - NS IVF -Follow-up renal function but  BMP  Essential hypertension: low at this time  - hold metoprolol  Chronic pain -hold dilaudid 2/2 low BP - continue fentanyl  Dyslipidemia - Continue Lipitor.  Urothelial cancer  - Stable.   Seizure ([place]) - Continue Lamictal. Depression and anxiety: Stable, no suicidal or homicidal ideations. -Continue Cymbalta and Remeron. History of PE and DVT: Not on AC due to risk of bleed. Has had IVC filter placed but this was removed per PCP request back in 02/2014. - No acute new issue   DVT ppx: SQ Heparin (will not start until IR see pt) Code Status:   full Family Communication: bedside Disposition Plan: admit   Total time spent 55 minutes.Greater than 50% of this time was spent in counseling, explanation of diagnosis, planning of further management, and coordination of care  [facility] Pager [phone]  If 7PM-7AM, please contact night-coverage [email or web address] Password [facility] 06/09/2015, 7:18 AM

## 2015-06-09 NOTE — Procedures (Signed)
Right PCN EXCHANGE LEFT PCN REPLACEMENT Bilateral pyonephrosis and hydro No comp Stable Keep to gravity bags

## 2015-06-09 NOTE — Progress Notes (Signed)
PT Cancellation Note  Patient Details Name: Samuel [name] MRN: [id number] DOB: Jan 15, 1956   Cancelled Treatment:    Reason Eval/Treat Not Completed: Patient not medically ready Pt on strict bedrest. Will follow up next available time to perform PT evaluation.   Marguarite Arbour A Rudolph Daoust 06/09/2015, 9:09 AM  Wray Kearns, PT, DPT [phone]

## 2015-06-09 NOTE — Progress Notes (Signed)
Discussed with the patient's daughter at the bedside. After discussion about the patient's clinical condition, I was made aware by the patient's daughter that she and the family would desire that the patient be transitioned to more focus of care towards comfort. She wants to have the patient transferred to [facility]. For now, the patient, daughter, and family are agreeable to continuing fluids and intravenous antibiotics with the understanding that these will be stopped once the patient is transferred to residential hospice. Further radiographic studies and blood work will be discontinued. Ativan and Dilaudid will be added back to the patient's medication regimen to facilitate his comfort. I contacted the social worker who will begin the process of transferring to [facility].  DTat

## 2015-06-09 NOTE — Progress Notes (Addendum)
Samuel [name] 2C-07-[facility] -[facility]-GIP Visit-[name] RN, BSN  This is a related admission to patient's [facility] diagnosis of bladder CA.  Patient is a DNR. Patient currently off unit in interventional radiology for correction of right nephrostomy tube dislodgement. Per chart review, patient received 25 mcg Fentanyl IV via Rt chest port. Patient was admitted to [facility] services 05/30/15 and resides at [facility].  [facility] will continue to follow and anticipate any discharge needs.  Please call with any questions.  Thank you, [name] RN, [facility] Liaison [phone]  Addendum: @ [phone]- Patient seen in room after procedure, with daughter, [name] at bedside.  Patient resting comfortably.  Daughter stated she was very displeased with the care her father received at [facility] and does not want the patient to go back there upon discharge.  She wants to see if the patient can be evaluated for placement at [facility] inpatient hospice facility. [facility] SW made aware.  [facility] medication list and transfer summary given to bedside RN for review with MD, then to be placed on chart.

## 2015-06-09 NOTE — Progress Notes (Signed)
The patient's MRSA PCR is positive.  Have placed orders per protocol.
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
## 2015-06-09 NOTE — Progress Notes (Signed)
Text page to DR Tat- returned call . Made aware of current med list on chart- hospice here saw pt and spoke to family - see their progress note.
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
## 2015-06-10 DIAGNOSIS — N179 Acute kidney failure, unspecified: Secondary | ICD-10-CM

## 2015-06-10 MED ORDER — MIRTAZAPINE 15 MG PO TABS
15.0000 mg | ORAL_TABLET | Freq: Every day | ORAL | Status: DC
  Administered 2015-06-10 – 2015-06-11 (×2): 15 mg via ORAL
  Filled 2015-06-10 (×2): qty 1

## 2015-06-10 NOTE — Discharge Summary (Signed)
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 HAND  Final   Special Requests BOTTLES DRAWN AEROBIC AND ANAEROBIC 5CC  Final   Culture NO GROWTH 2 DAYS  Final   Report Status PENDING  Incomplete  Blood culture (routine x 2)     Status: None (Preliminary result)   Collection Time: 06/08/15  8:52 PM  Result Value Ref Range Status   Specimen Description BLOOD RIGHT ARM  Final   Special Requests BOTTLES DRAWN AEROBIC AND ANAEROBIC 5CC  Final   Culture  Setup Time   Final    ANAEROBIC BOTTLE ONLY GRAM NEGATIVE RODS CRITICAL RESULT CALLED TO, READ BACK BY AND VERIFIED WITH: S.MARSHALL,RN PH:[id number] 06/11/15 M.CAMPBELL    Culture NO GROWTH 2 DAYS  Final   Report Status PENDING  Incomplete  Urine culture     Status: None   Collection Time: 06/08/15  9:41 PM  Result Value Ref Range Status   Specimen Description URINE, RANDOM  Final   Special Requests NONE  Final   Culture >=100,000 COLONIES/mL ESCHERICHIA COLI  Final   Report Status 06/11/2015 FINAL  Final   Organism ID, Bacteria ESCHERICHIA COLI  Final      Susceptibility   Escherichia coli - MIC*    AMPICILLIN >=32 RESISTANT Resistant     CEFAZOLIN <=4 SENSITIVE Sensitive     CEFTRIAXONE <=1 SENSITIVE Sensitive      CIPROFLOXACIN >=4 RESISTANT Resistant     GENTAMICIN <=1 SENSITIVE Sensitive     IMIPENEM <=0.25 SENSITIVE Sensitive     NITROFURANTOIN <=16 SENSITIVE Sensitive     TRIMETH/SULFA >=320 RESISTANT Resistant     AMPICILLIN/SULBACTAM >=32 RESISTANT Resistant     PIP/TAZO <=4 SENSITIVE Sensitive     * >=100,000 COLONIES/mL ESCHERICHIA COLI  MRSA PCR Screening     Status: Abnormal   Collection Time: 06/09/15  2:25 AM  Result Value Ref Range Status   MRSA by PCR POSITIVE (A) NEGATIVE Final    Comment:        The GeneXpert MRSA Assay (FDA approved for NASAL specimens only), is one component of a comprehensive MRSA colonization surveillance program. It is not intended to diagnose MRSA infection nor to guide or monitor treatment for MRSA infections. RESULT CALLED TO, READ BACK BY AND VERIFIED WITH: A PETTIFORD @0439  06/09/15 MKELLY   Culture, routine-abscess  Status: None (Preliminary result)   Collection Time: 06/09/15 11:27 AM  Result Value Ref Range Status   Specimen Description ABSCESS  Final   Special Requests RIGHT KIDNEY  Final   Gram Stain   Final    ABUNDANT WBC PRESENT,BOTH PMN AND MONONUCLEAR NO SQUAMOUS EPITHELIAL CELLS SEEN FEW GRAM NEGATIVE RODS Performed at [facility]    Culture   Final    Culture reincubated for better growth Performed at [facility]    Report Status PENDING  Incomplete     Labs: Basic Metabolic Panel:  Recent Labs Lab 06/08/15 2057 06/09/15 0312  NA 124* 127*  K 4.6 4.2  CL 95* 100*  CO2 18* 16*  GLUCOSE 103* 90  BUN 24* 20  CREATININE 1.92* 1.95*  CALCIUM 10.1 9.4   Liver Function Tests:  Recent Labs Lab 06/08/15 2057 06/09/15 0312  AST 18 25  ALT 14* 12*  ALKPHOS 123 125  BILITOT 0.8 1.6*  PROT 7.6 7.4  ALBUMIN 1.9* 1.7*    Recent Labs Lab 06/08/15 2057  LIPASE 15   No results for input(s): AMMONIA in the last 168 hours. CBC:  Recent Labs Lab 06/08/15 2057 06/09/15 0312  WBC 16.3*  17.5*  NEUTROABS 14.1*  --   HGB 8.9* 8.3*  HCT 28.0* 26.6*  MCV 86.2 86.9  PLT 293 248   Cardiac Enzymes: No results for input(s): CKTOTAL, CKMB, CKMBINDEX, TROPONINI in the last 168 hours. BNP: Invalid input(s): POCBNP CBG: No results for input(s): GLUCAP in the last 168 hours.  Time coordinating discharge:  Greater than 30 minutes  Signed:  Haiden Rawlinson, DO [facility] Pager: [phone] 06/11/2015, 11:27 AM

## 2015-06-10 NOTE — Progress Notes (Signed)
Valarie Merino 2C-07[facility] Visit-Lisa Strandberg RN  This is a related admission to patient's [facility] diagnosis of bladder CA. Patient is a DNR. Pt. Dozing in bed when writer entered room this morning. He awoke and stated he was not having a good day today. His speech was very soft and most words were difficult to understand. No family at the bedside at this time. Resps regular with no audible wheezing. He appeared relaxed in the bed with no facial  grimacing or outward signs of pain or discomfort. Spoke  with Everlene Other, RN who reports he is continuing abx and his right nephrostomy tube is intact. She reports he ate 1 bite of breakfast and a few bites of yogurt today. Per chart review pt. Has had 2 doses of Dilaudid 0.5 mg IV in the last 12 hours and 0.5 mg IV of Ativan early this morning. He has a 50 mcg Fentanyl patch placed yesterday. No plans currently for discharge today. [facility] will continue to follow daily. Please call with any questions.  [facility] Liaison [phone]

## 2015-06-10 NOTE — Progress Notes (Signed)
Called report to [facility] on [place]. Pt will transfer per bed.

## 2015-06-10 NOTE — Progress Notes (Signed)
PT Cancellation and Discharge Note  Patient Details Name: Jontez Stitt MRN: [id number] DOB: June 02, 1956   Cancelled Treatment:    Reason Eval/Treat Not Completed: Other (comment)   Noted plans for [facility], and that Goals of Care are more focused on comfort at this time;  Discussed Mr. Cusano with Everlene Other, RN, and she conveyed that at baseline he is bedbound;  Will dc Physical Therapy as it is not congruent with Mr. Gilstrap' Goals of Care;   Thanks,  Roney Marion, PT  Acute Rehabilitation Services Pager [phone] Office [phone]    Roney Marion [facility] 06/10/2015, 12:44 PM

## 2015-06-10 NOTE — Progress Notes (Signed)
Transferred patient from 2 S,in bed,awaken only by verbal stimuli.Alert to himself.DNR.Place in a position of comfort.

## 2015-06-10 NOTE — Progress Notes (Signed)
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 for better growth Performed at [place] PENDING  Incomplete     Scheduled Meds: . Chlorhexidine Gluconate Cloth  6 each Topical Q0600  . DULoxetine  30 mg Oral Daily  . fentaNYL  50 mcg Transdermal Q72H  . heparin  5,000 Units Subcutaneous 3 times per day  . imipenem-cilastatin  250 mg Intravenous 4 times per day  . lactulose  10 g Oral TID  . lamoTRIgine  200 mg Oral BID  . mirtazapine  15 mg Oral QHS  . mupirocin ointment  1 application Nasal BID  . polyethylene glycol  17 g Oral BID  . polyvinyl alcohol  2 drop Both Eyes TID  . senna-docusate  1 tablet Oral QHS  . sodium chloride  3 mL Intravenous Q12H  . [START ON 06/28/2015] Vitamin D (Ergocalciferol)  50,000 Units Oral Q30 days   Continuous Infusions: . sodium chloride 100 mL/hr (06/10/15 1241)     Berit Raczkowski, DO  [facility] Pager [phone]  If 7PM-7AM, please contact night-coverage [email or web address] Password [email or web address] 06/10/2015, 1:27 PM   LOS: 2 days

## 2015-06-10 NOTE — Progress Notes (Signed)
Pt transferred per bed to 6 East bed 8 per bed accompanied by RN & tech with personal belongings. Pt has 1 large black backpack that came with him from [place] [facility]. Family called and made aware of transfer. IV per portacath.

## 2015-06-11 DIAGNOSIS — A4151 Sepsis due to Escherichia coli [E. coli]: Secondary | ICD-10-CM

## 2015-06-11 LAB — URINE CULTURE: Culture: >=100000

## 2015-06-11 MED ORDER — MORPHINE SULFATE (CONCENTRATE) 10 MG /0.5 ML PO SOLN: 4.0000 mg | ORAL | Status: AC | PRN

## 2015-06-11 MED ORDER — DEXTROSE 5 % IV SOLN
2.0000 g | INTRAVENOUS | Status: DC
  Administered 2015-06-11 – 2015-06-12 (×2): 2 g via INTRAVENOUS
  Filled 2015-06-11 (×2): qty 2

## 2015-06-11 MED ORDER — LORAZEPAM 0.5 MG PO TABS: 0.5000 mg | ORAL_TABLET | Freq: Two times a day (BID) | ORAL | Status: AC

## 2015-06-11 NOTE — Progress Notes (Addendum)
Valarie Merino 2C-07-[facility]-GIP Visit-Lisa Strandberg RN  This is a related admission to patient's [facility] diagnosis of bladder CA. Patient is a DNR. Pt. sleeping when Probation officer arrived. He awakened to his name and went back to sleep. Pt. spoke one sentence and it was unintelligible. Pt's right leg is very swollen. Resps regular and unlabored. Pt. Is on room air. Per chart review pt. Has ben eating between 25-100% of meals but mostly 25%. He continues on IV rocephin q 24 hrs. Pt. Has had a total of 2 mg IV Dilaudid in the last 24 hours. Pt. Receives NS IV at 100cc/hr. [facility] will continue to follow daily. Please call with any questions.  [facility] Liaison [phone]   Addendum: 4 pm Call received from [place] at [facility] of [facility] who reports pt. will be transfering there tomorrow.

## 2015-06-11 NOTE — Progress Notes (Addendum)
Nutrition Brief Note  Chart reviewed.  Pt now transitioning to comfort care. Plan for pt to d/c to residential hospice.  No nutrition interventions warranted at this time.  Support PO intake as able for comfort.  Please consult as needed.   [place], [place], [place] Pager [phone] After Hours Pager

## 2015-06-11 NOTE — Clinical Social Work Note (Signed)
CSW received consult for residential Hospice Placement today. Per [facility] daughter requested [facility], however CSW later learned that daughter is requesting [facility]. Contact made with [facility], referral staff person regarding patient and clinicals transmitted to facility. After review, CSW informed that patient is appropriate for residential hospice hospital and can be admitted Tuesday, 11/15.  MD notified and MOST form completed and transmitted to facility at their request.  CSW also talked with patient's daughter [name] at the bedside and updated her regarding patient's acceptance at the hospice facility.  Lucyann Romano Givens, MSW, LCSW Licensed Clinical Social Worker [place] [phone]

## 2015-06-12 MED ORDER — LORAZEPAM 0.5 MG PO TABS: 0.5000 mg | ORAL_TABLET | Freq: Once | ORAL | Status: DC

## 2015-06-12 MED ORDER — HEPARIN SOD (PORK) LOCK FLUSH 100 UNIT/ML IV SOLN
500.0000 [IU] | INTRAVENOUS | Status: AC | PRN
  Administered 2015-06-12: 500 [IU]

## 2015-06-12 NOTE — Progress Notes (Signed)
Utilization review completed. Carole Deere, RN, BSN. 

## 2015-06-12 NOTE — Clinical Social Work Note (Signed)
Patient discharging to [facility] facility today. Discharge information transmitted to facility and transportation arranged via [facility].  Daughter, Etan Coriell contacted [phone]) and informed that transport called.   Neoma Uhrich Givens, MSW, LCSW Licensed Clinical Social Worker [place] [phone]

## 2015-06-12 NOTE — Discharge Summary (Signed)
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 Invalid input(s): POCBNP CBG: No results for input(s): GLUCAP in the last 168 hours.  Time coordinating discharge:  Greater than 30 minutes  Signed:  Carlee Vonderhaar, DO [facility] Pager: [phone] 06/12/2015, 8:44 AM

## 2015-06-13 LAB — CULTURE, ROUTINE-ABSCESS

## 2015-06-13 LAB — CULTURE, BLOOD (ROUTINE X 2): CULTURE: NO GROWTH

## 2015-06-14 LAB — CULTURE, BLOOD (ROUTINE X 2)

## 2015-06-28 ENCOUNTER — Other Ambulatory Visit (HOSPITAL_COMMUNITY): Payer: Self-pay

## 2015-06-28 DEATH — deceased

## 2016-07-29 ENCOUNTER — Other Ambulatory Visit: Payer: Self-pay | Admitting: Nurse Practitioner

## 2016-09-13 IMAGING — CR DG CHEST 1V PORT
1 series · 1 of 1 positions shown · non-contrast
Comparison: 03/23/2015

CLINICAL DATA: Fever, cough and shortness of breath.

EXAM:
PORTABLE CHEST - 1 VIEW

[AP]
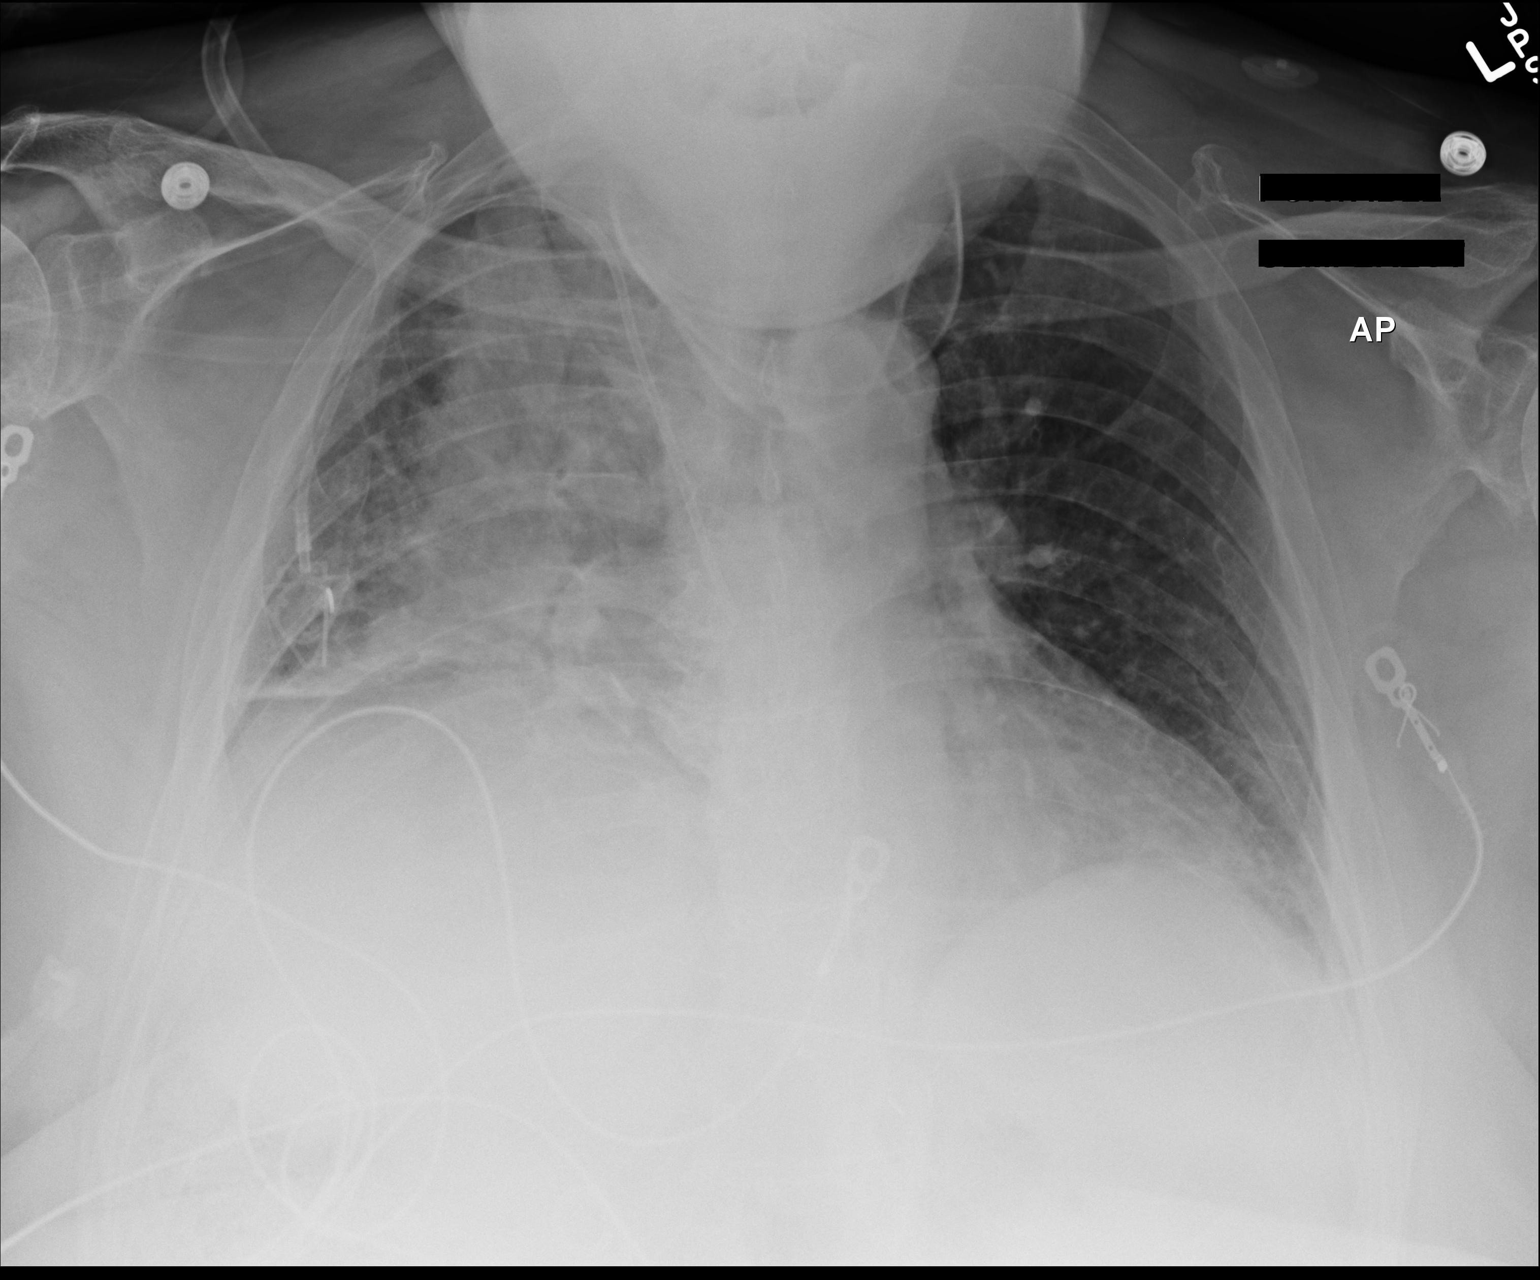

[1 of 1 positions shown; findings below may reference images not displayed]

FINDINGS: Right chest port remains in place, tip in the SVC. Development of
right perihilar airspace opacity. Again seen elevation of right
hemidiaphragm. Mild atelectasis at the left lung base.
Cardiomediastinal contours are unchanged. No pulmonary edema. No
pneumothorax.
IMPRESSION: Development of right perihilar airspace opacity, concerning for
pneumonia. Followup PA and lateral chest X-ray is recommended in 3-4
weeks following trial of antibiotic therapy to ensure resolution and
exclude underlying malignancy.

## 2016-10-16 IMAGING — XA IR NEPHROURETERAL CATH PLACEMENT RIGHT
1 series · 4 of 4 positions shown · non-contrast
Comparison: none

CLINICAL DATA: Left nephrostomy pulled back. Right nephrostomy
pulled out.

[Series 300: ir nephroureteral cath place right · 4 of 4 slices shown]
[im 1/4]
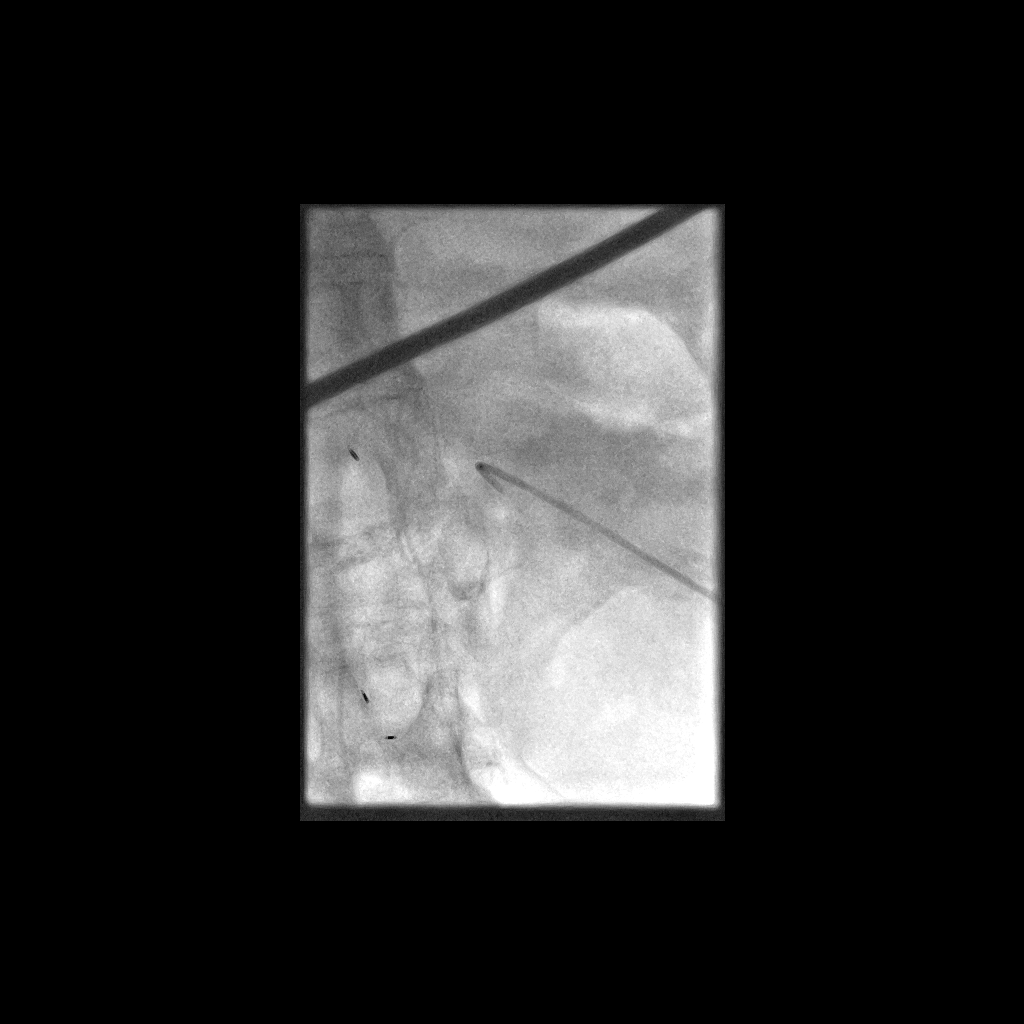
[im 2/4]
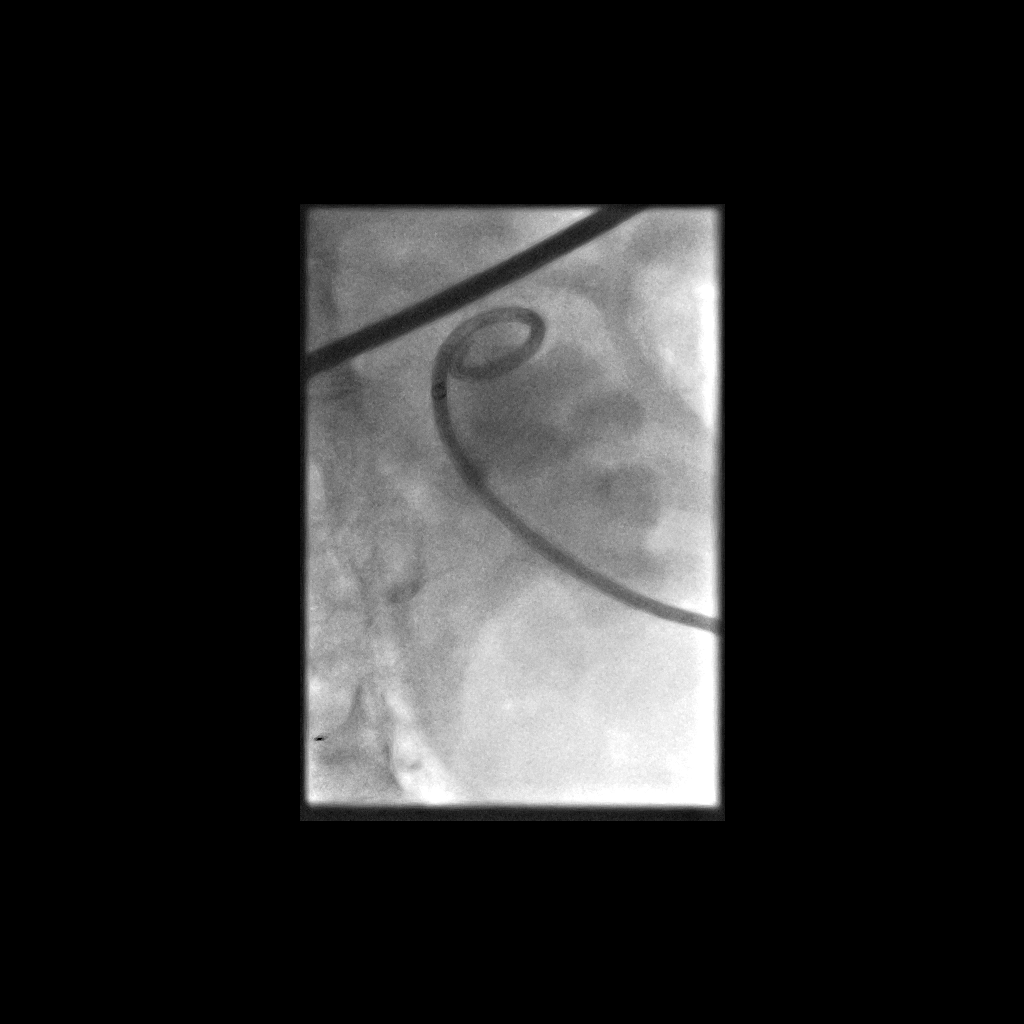
[im 3/4]
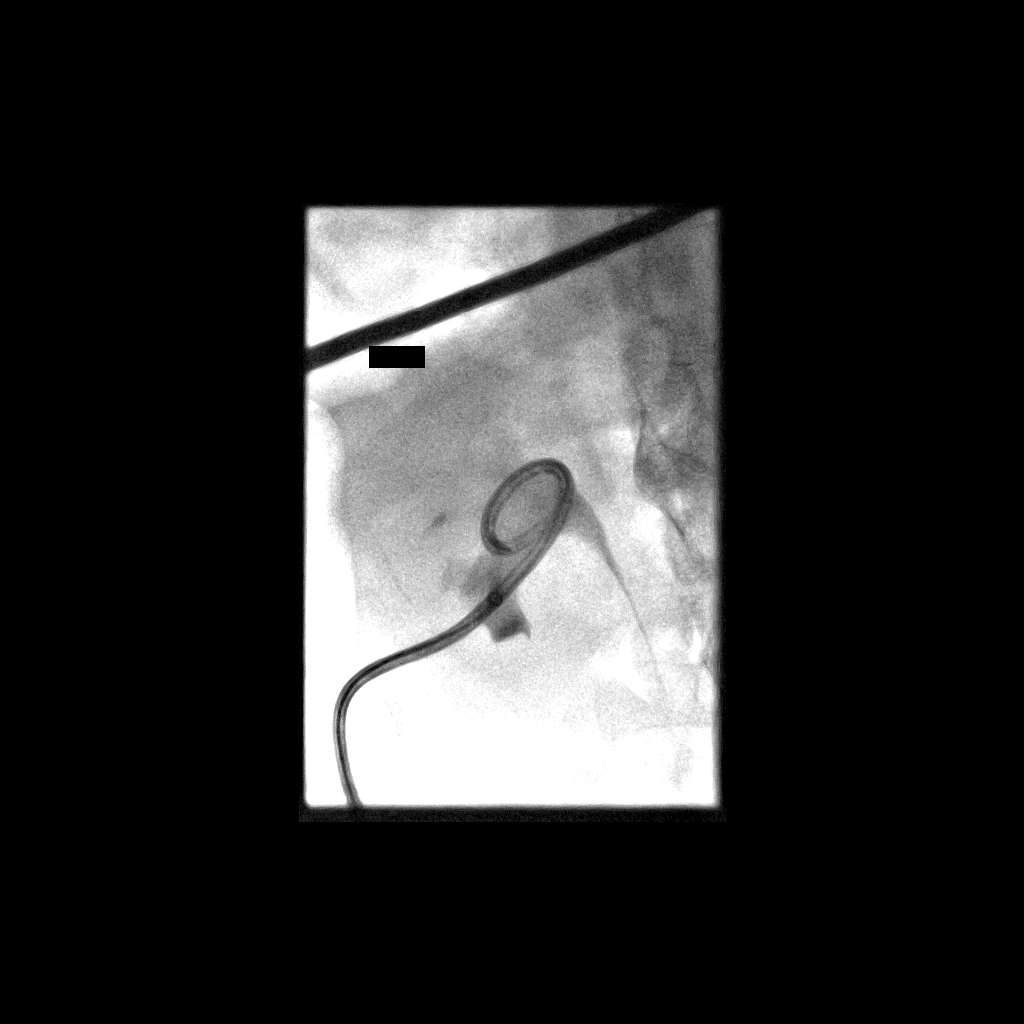
[im 4/4]
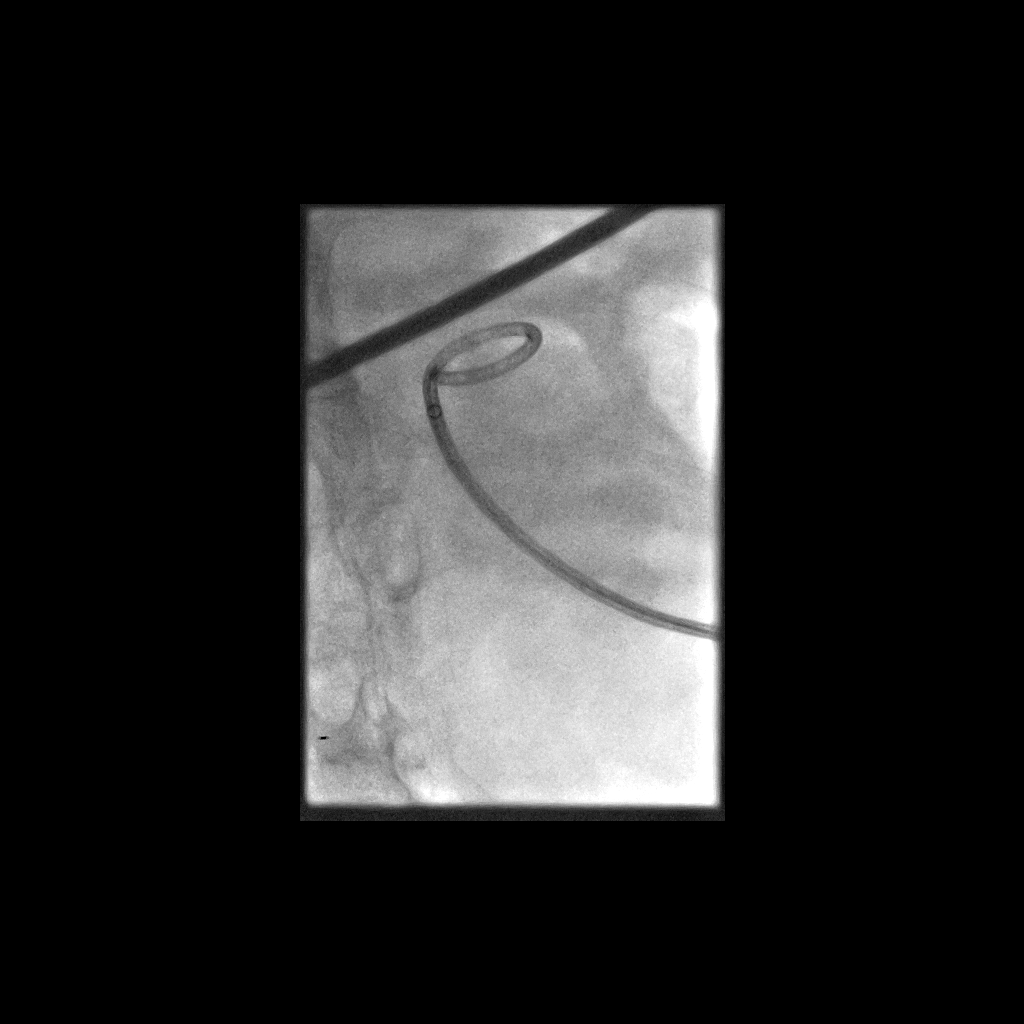

[4 of 4 positions shown; findings below may reference images not displayed]

EXAM:
IR NEPHROURETERAL CATH PLACEMENT RIGHT; PERC TUBE CHG W/CM

FLUOROSCOPY TIME:  1 minutes and 24 seconds

MEDICATIONS AND MEDICAL HISTORY:
Versed 2 mg, Fentanyl 100 mcg.

Additional Medications: None.

ANESTHESIA/SEDATION:
Moderate sedation time: 16 minutes

CONTRAST:  10 cc Omnipaque 300

PROCEDURE:
The procedure, risks, benefits, and alternatives were explained to
the patient. Questions regarding the procedure were encouraged and
answered. The patient understands and consents to the procedure.

The back was prepped with Betadine in a sterile fashion, and a
sterile drape was applied covering the operative field. A sterile
gown and sterile gloves were used for the procedure.

The left nephrostomy catheter was injected with contrast. It was cut
and exchanged over a Bentson wire for a 10 French nephrostomy. It
was looped and string fixed in the renal pelvis then sewn to the
skin. Contrast was injected

A copy catheter was inserted into the right nephrostomy insertion
site. A Bentson wire was carefully advanced through the copy
catheter and into the renal pelvis. The copy catheter was advanced
over the Bentson wire into the right renal pelvis. Contrast was
injected. The copy was then exchanged for a 10 French nephrostomy
over the Bentson wire. It was looped and string fixed in the right
renal pelvis then sewn to the skin. Contrast was injected.
FINDINGS: Imaging over the left kidney demonstrates that the tip of the left
nephrostomy is barely in the collecting system. The subsequent
imaging demonstrates exchange of the left nephrostomy for a new
nephrostomy catheter which is coiled in the renal pelvis.

Images over the right kidney demonstrating new right nephrostomy
catheter with its tip coiled in the renal pelvis.

COMPLICATIONS:
None
IMPRESSION: Successful left nephrostomy catheter exchange and right nephrostomy
catheter replacement.

## 2016-10-21 IMAGING — XA IR EXCHANGE NEPHROSTOMY RIGHT
1 series · 7 of 7 positions shown · non-contrast
Comparison: none

CLINICAL DATA: Chronic external nephrostomies, right nephrostomy
has been pulled out. Left nephrostomy is retracted.

[Series 300: ir nephrostomy placement right · 7 of 7 slices shown]
[im 1/7]
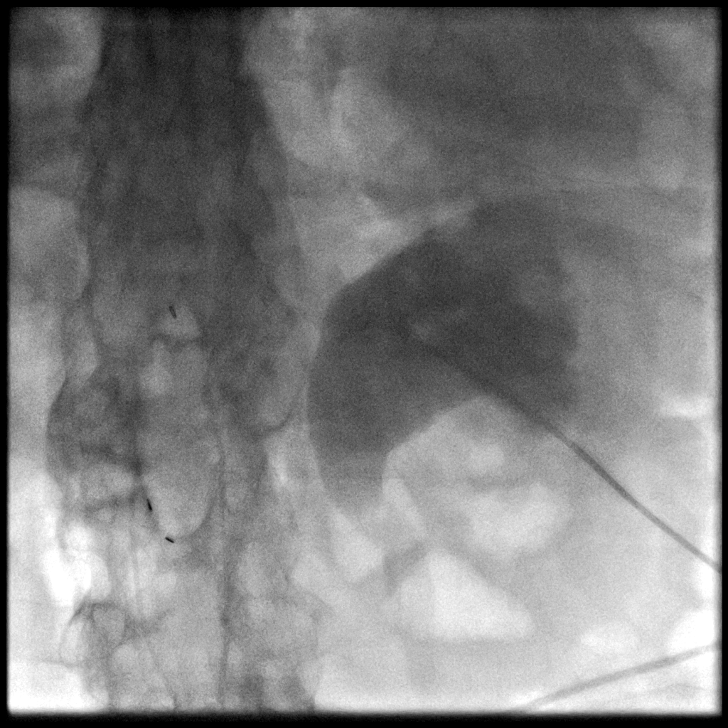
[im 2/7]
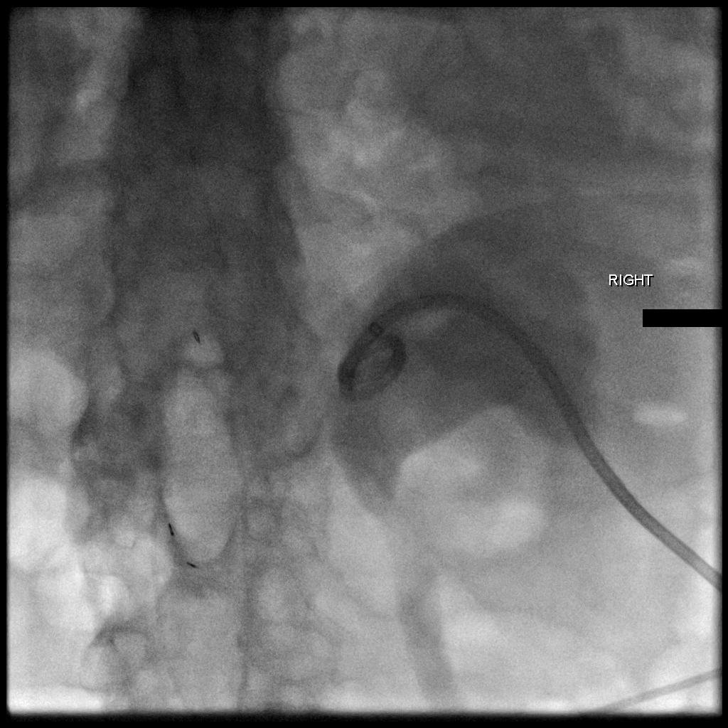
[im 3/7]
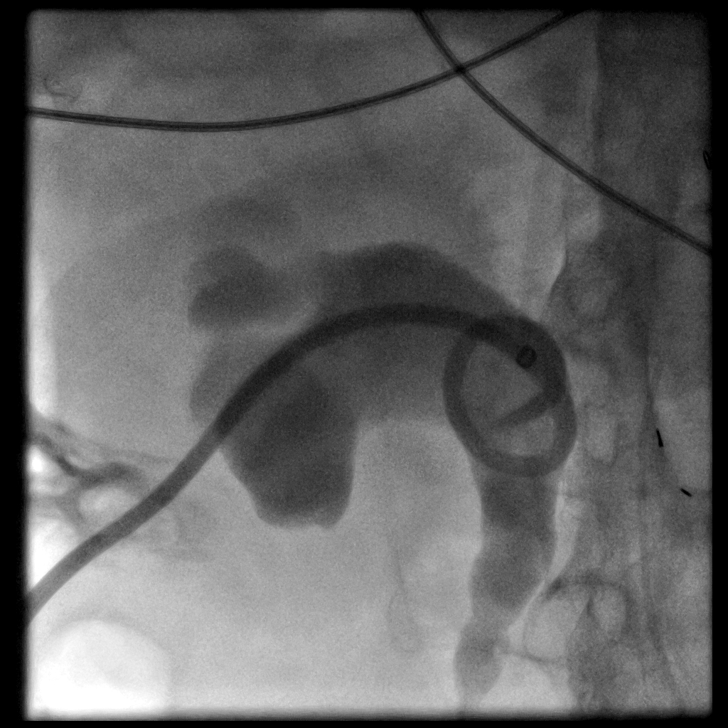
[im 4/7]
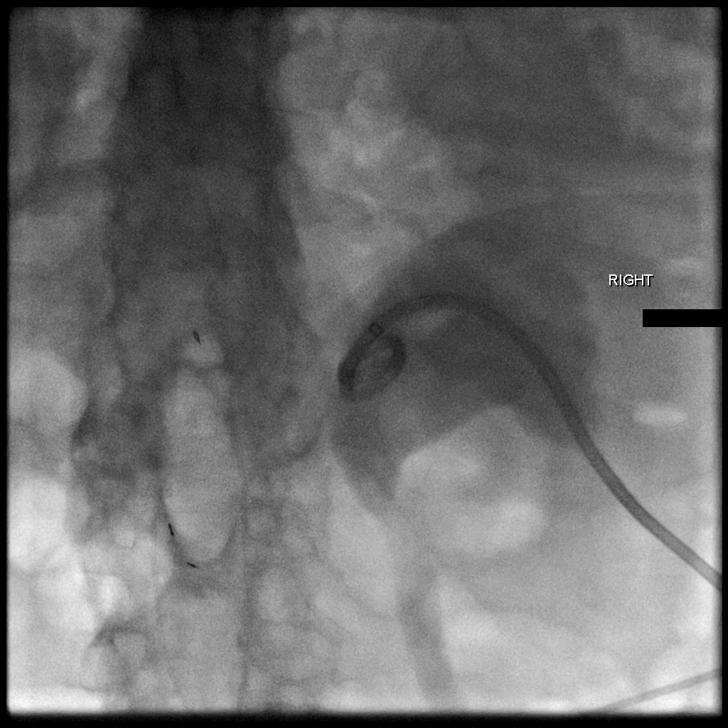
[im 5/7]
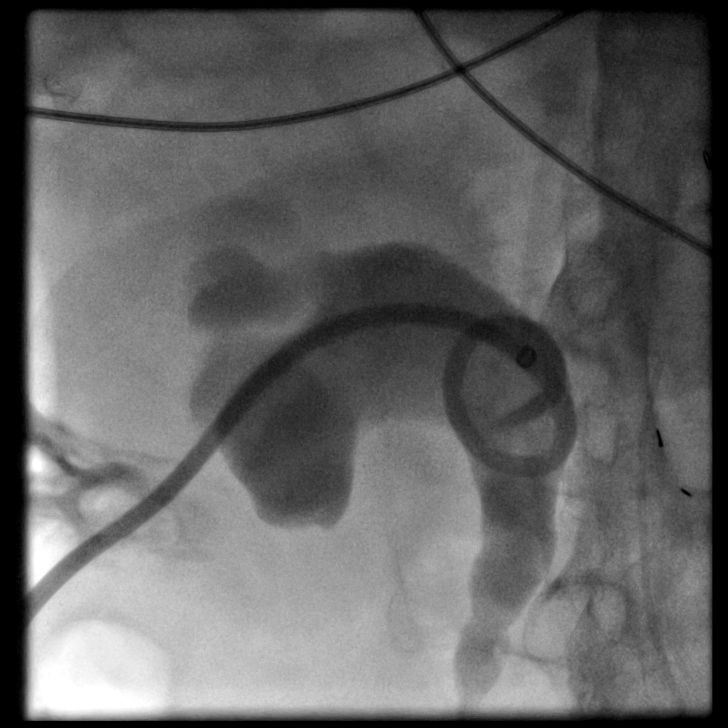
[im 6/7]
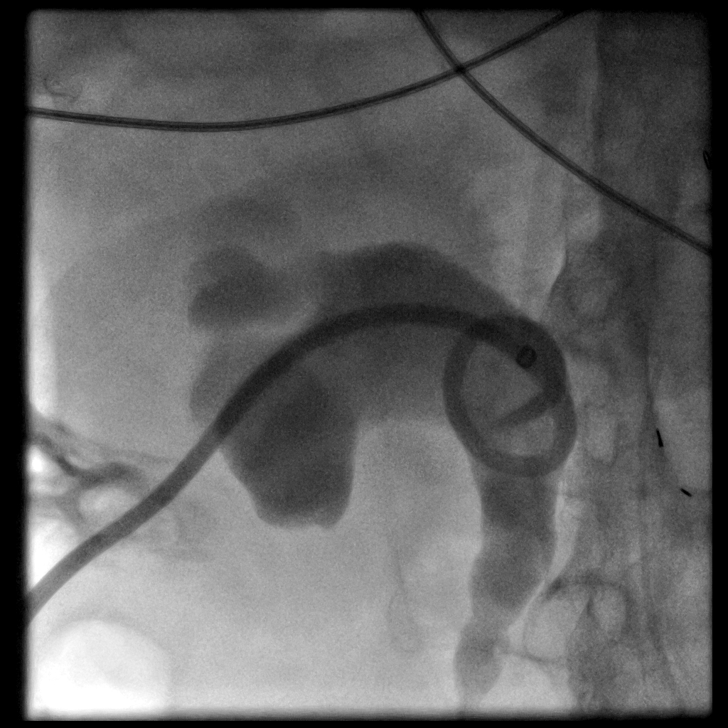
[im 7/7]
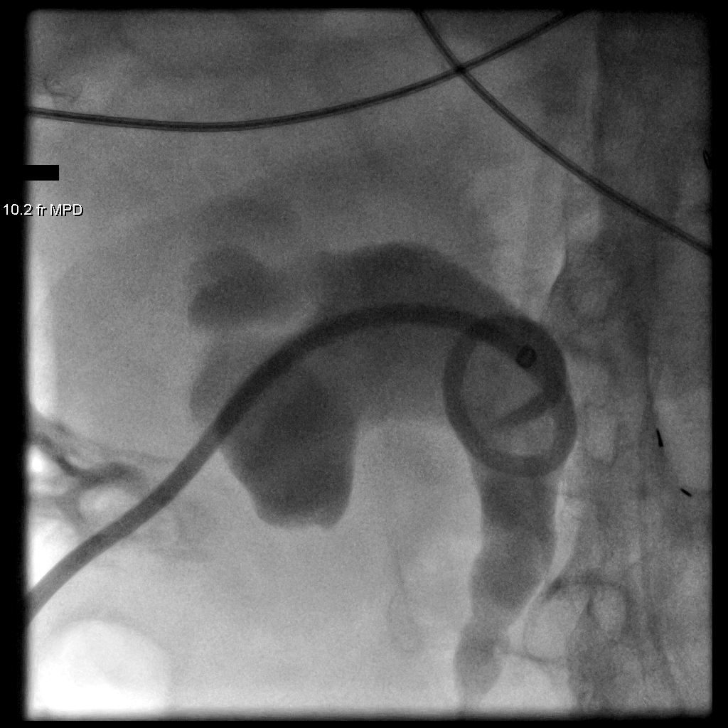

[7 of 7 positions shown; findings below may reference images not displayed]

EXAM:
Right nephrostomy catheter replacement through the existing
percutaneous tract.

Ultrasound and fluoroscopically guided left nephrostomy insertion

Radiologist:  Halperin, Sal

Guidance:  Ultrasound and fluoroscopic

FLUOROSCOPY TIME:  6 minutes 48 seconds, 359 mGy

MEDICATIONS AND MEDICAL HISTORY:
1 mg Versed, 25 mcg fentanyl, 1 mg Ativan

ANESTHESIA/SEDATION:
5 minutes

CONTRAST:  10 cc

COMPLICATIONS:
None immediate

PROCEDURE:
Informed consent was obtained from the patient following explanation
of the procedure, risks, benefits and alternatives. The patient
understands, agrees and consents for the procedure. All questions
were addressed. A time out was performed.

Maximal barrier sterile technique utilized including caps, mask,
sterile gowns, sterile gloves, large sterile drape, hand hygiene,
and Betadine.

Right nephrostomy catheter replacement through the existing
percutaneous tract: Under sterile conditions and local anesthesia, a
Kumpe catheter was advanced through the existing right percutaneous
nephrostomy tract and advanced into the renal pelvis. There was
return of urine. Contrast injection confirms position. Amplatz
guidewire inserted followed by advancement of a 10 French
nephrostomy catheter. Retention loop formed in the renal pelvis.
Images obtained for documentation. Catheter secured with a Prolene
suture. Sterile dressing applied.

Left nephrostomy insertion: Several attempts were made to
recannulate the left percutaneous tract however this was
unsuccessful in accessing the collecting system.

Ultrasound confirms hydronephrosis.

Under sterile conditions and local anesthesia, an 18 gauge 15 cm
needle was advanced into a mid pole dilated calyx. Images obtained
for documentation. Amplatz guidewire inserted followed by tract
dilatation to insert a new 10 French nephrostomy. Retention loop
formed in the renal pelvis. Position confirmed with contrast
injection and fluoroscopic imaging. There was return of clear urine.
Catheter secured with Prolene suture and a sterile dressing.
IMPRESSION: Successful replacement of the right nephrostomy catheter through the
existing percutaneous tract.

Successful ultrasound and fluoroscopic left nephrostomy insertion.

## 2018-11-21 ENCOUNTER — Encounter: Payer: Self-pay | Admitting: Internal Medicine
